# Patient Record
Sex: Female | Born: 1935
Health system: Southern US, Community
[De-identification: ages and names within clinical notes are randomized; demographics above are authoritative.]

## PROBLEM LIST (undated history)

## (undated) DIAGNOSIS — F329 Major depressive disorder, single episode, unspecified: Secondary | ICD-10-CM

## (undated) DIAGNOSIS — I739 Peripheral vascular disease, unspecified: Secondary | ICD-10-CM

## (undated) DIAGNOSIS — J9600 Acute respiratory failure, unspecified whether with hypoxia or hypercapnia: Secondary | ICD-10-CM

## (undated) DIAGNOSIS — K635 Polyp of colon: Secondary | ICD-10-CM

## (undated) DIAGNOSIS — L03115 Cellulitis of right lower limb: Secondary | ICD-10-CM

## (undated) DIAGNOSIS — M1990 Unspecified osteoarthritis, unspecified site: Secondary | ICD-10-CM

## (undated) DIAGNOSIS — E669 Obesity, unspecified: Secondary | ICD-10-CM

## (undated) DIAGNOSIS — G4733 Obstructive sleep apnea (adult) (pediatric): Secondary | ICD-10-CM

## (undated) DIAGNOSIS — L851 Acquired keratosis [keratoderma] palmaris et plantaris: Secondary | ICD-10-CM

## (undated) DIAGNOSIS — E041 Nontoxic single thyroid nodule: Secondary | ICD-10-CM

## (undated) DIAGNOSIS — F339 Major depressive disorder, recurrent, unspecified: Secondary | ICD-10-CM

## (undated) DIAGNOSIS — E119 Type 2 diabetes mellitus without complications: Secondary | ICD-10-CM

## (undated) DIAGNOSIS — M109 Gout, unspecified: Secondary | ICD-10-CM

## (undated) DIAGNOSIS — I10 Essential (primary) hypertension: Secondary | ICD-10-CM

## (undated) DIAGNOSIS — R531 Weakness: Secondary | ICD-10-CM

## (undated) DIAGNOSIS — I509 Heart failure, unspecified: Secondary | ICD-10-CM

## (undated) DIAGNOSIS — E785 Hyperlipidemia, unspecified: Secondary | ICD-10-CM

## (undated) DIAGNOSIS — I5033 Acute on chronic diastolic (congestive) heart failure: Secondary | ICD-10-CM

## (undated) DIAGNOSIS — K219 Gastro-esophageal reflux disease without esophagitis: Secondary | ICD-10-CM

## (undated) DIAGNOSIS — G47419 Narcolepsy without cataplexy: Secondary | ICD-10-CM

## (undated) DIAGNOSIS — I2510 Atherosclerotic heart disease of native coronary artery without angina pectoris: Secondary | ICD-10-CM

## (undated) DIAGNOSIS — G2581 Restless legs syndrome: Secondary | ICD-10-CM

## (undated) DIAGNOSIS — Z794 Long term (current) use of insulin: Secondary | ICD-10-CM

## (undated) DIAGNOSIS — N6009 Solitary cyst of unspecified breast: Secondary | ICD-10-CM

## (undated) DIAGNOSIS — R0609 Other forms of dyspnea: Secondary | ICD-10-CM

## (undated) DIAGNOSIS — R55 Syncope and collapse: Secondary | ICD-10-CM

## (undated) DIAGNOSIS — N8112 Cystocele, lateral: Secondary | ICD-10-CM

## (undated) DIAGNOSIS — M25579 Pain in unspecified ankle and joints of unspecified foot: Secondary | ICD-10-CM

## (undated) DIAGNOSIS — M549 Dorsalgia, unspecified: Secondary | ICD-10-CM

## (undated) DIAGNOSIS — L259 Unspecified contact dermatitis, unspecified cause: Secondary | ICD-10-CM

## (undated) DIAGNOSIS — M7989 Other specified soft tissue disorders: Secondary | ICD-10-CM

## (undated) DIAGNOSIS — C801 Malignant (primary) neoplasm, unspecified: Secondary | ICD-10-CM

## (undated) DIAGNOSIS — Z86010 Personal history of colonic polyps: Secondary | ICD-10-CM

## (undated) DIAGNOSIS — R609 Edema, unspecified: Secondary | ICD-10-CM

## (undated) DIAGNOSIS — R109 Unspecified abdominal pain: Secondary | ICD-10-CM

## (undated) DIAGNOSIS — G459 Transient cerebral ischemic attack, unspecified: Secondary | ICD-10-CM

## (undated) DIAGNOSIS — G4700 Insomnia, unspecified: Secondary | ICD-10-CM

## (undated) DIAGNOSIS — H8309 Labyrinthitis, unspecified ear: Secondary | ICD-10-CM

## (undated) DIAGNOSIS — R2681 Unsteadiness on feet: Secondary | ICD-10-CM

## (undated) DIAGNOSIS — R42 Dizziness and giddiness: Secondary | ICD-10-CM

## (undated) DIAGNOSIS — K589 Irritable bowel syndrome without diarrhea: Secondary | ICD-10-CM

## (undated) DIAGNOSIS — I639 Cerebral infarction, unspecified: Secondary | ICD-10-CM

## (undated) DIAGNOSIS — M25559 Pain in unspecified hip: Secondary | ICD-10-CM

## (undated) DIAGNOSIS — B373 Candidiasis of vulva and vagina: Secondary | ICD-10-CM

## (undated) DIAGNOSIS — E0842 Diabetes mellitus due to underlying condition with diabetic polyneuropathy: Secondary | ICD-10-CM

## (undated) DIAGNOSIS — R197 Diarrhea, unspecified: Secondary | ICD-10-CM

## (undated) DIAGNOSIS — E1165 Type 2 diabetes mellitus with hyperglycemia: Secondary | ICD-10-CM

## (undated) DIAGNOSIS — J449 Chronic obstructive pulmonary disease, unspecified: Secondary | ICD-10-CM

## (undated) DIAGNOSIS — N200 Calculus of kidney: Secondary | ICD-10-CM

## (undated) DIAGNOSIS — K5730 Diverticulosis of large intestine without perforation or abscess without bleeding: Secondary | ICD-10-CM

## (undated) DIAGNOSIS — M129 Arthropathy, unspecified: Secondary | ICD-10-CM

## (undated) DIAGNOSIS — R079 Chest pain, unspecified: Secondary | ICD-10-CM

## (undated) DIAGNOSIS — R413 Other amnesia: Secondary | ICD-10-CM

## (undated) DIAGNOSIS — L03116 Cellulitis of left lower limb: Secondary | ICD-10-CM

## (undated) DIAGNOSIS — D649 Anemia, unspecified: Secondary | ICD-10-CM

## (undated) DIAGNOSIS — E876 Hypokalemia: Secondary | ICD-10-CM

## (undated) DIAGNOSIS — R51 Headache: Secondary | ICD-10-CM

## (undated) DIAGNOSIS — I5022 Chronic systolic (congestive) heart failure: Secondary | ICD-10-CM

## (undated) HISTORY — DX: Dorsalgia, unspecified: M54.9

## (undated) HISTORY — DX: Cystocele, lateral: N81.12

## (undated) HISTORY — DX: Polyp of colon: K63.5

## (undated) HISTORY — DX: Cellulitis of left lower limb: L03.116

## (undated) HISTORY — DX: Long term (current) use of insulin: Z79.4

## (undated) HISTORY — DX: Labyrinthitis, unspecified ear: H83.09

## (undated) HISTORY — DX: Cerebral infarction, unspecified: I63.9

## (undated) HISTORY — DX: Pain in unspecified ankle and joints of unspecified foot: M25.579

## (undated) HISTORY — DX: Essential (primary) hypertension: I10

## (undated) HISTORY — DX: Narcolepsy without cataplexy: G47.419

## (undated) HISTORY — DX: Type 2 diabetes mellitus without complications: E11.9

## (undated) HISTORY — DX: Atherosclerotic heart disease of native coronary artery without angina pectoris: I25.10

## (undated) HISTORY — DX: Unspecified osteoarthritis, unspecified site: M19.90

## (undated) HISTORY — DX: Restless legs syndrome: G25.81

## (undated) HISTORY — DX: Hypokalemia: E87.6

## (undated) HISTORY — DX: Chronic systolic (congestive) heart failure: I50.22

## (undated) HISTORY — DX: Obesity, unspecified: E66.9

## (undated) HISTORY — DX: Irritable bowel syndrome without diarrhea: K58.9

## (undated) HISTORY — DX: Unsteadiness on feet: R26.81

## (undated) HISTORY — DX: Unspecified contact dermatitis, unspecified cause: L25.9

## (undated) HISTORY — DX: Anemia, unspecified: D64.9

## (undated) HISTORY — PX: ABDOMINAL HYSTERECTOMY: SHX81

## (undated) HISTORY — DX: Solitary cyst of unspecified breast: N60.09

## (undated) HISTORY — DX: Gout, unspecified: M10.9

## (undated) HISTORY — DX: Edema, unspecified: R60.9

## (undated) HISTORY — DX: Chest pain, unspecified: R07.9

## (undated) HISTORY — DX: Other amnesia: R41.3

## (undated) HISTORY — DX: Acute on chronic diastolic (congestive) heart failure: I50.33

## (undated) HISTORY — DX: Diabetes mellitus due to underlying condition with diabetic polyneuropathy: E08.42

## (undated) HISTORY — PX: BACK SURGERY: SHX140

## (undated) HISTORY — DX: Gastro-esophageal reflux disease without esophagitis: K21.9

## (undated) HISTORY — DX: Headache: R51

## (undated) HISTORY — DX: Candidiasis of vulva and vagina: B37.3

## (undated) HISTORY — DX: Calculus of kidney: N20.0

## (undated) HISTORY — DX: Acquired keratosis (keratoderma) palmaris et plantaris: L85.1

## (undated) HISTORY — DX: Weakness: R53.1

## (undated) HISTORY — PX: SPINE SURGERY: SHX786

## (undated) HISTORY — DX: Arthropathy, unspecified: M12.9

## (undated) HISTORY — DX: Type 2 diabetes mellitus with hyperglycemia: E11.65

## (undated) HISTORY — DX: Nontoxic single thyroid nodule: E04.1

## (undated) HISTORY — DX: Major depressive disorder, single episode, unspecified: F32.9

## (undated) HISTORY — DX: Unspecified abdominal pain: R10.9

## (undated) HISTORY — PX: INTRAOCULAR LENS INSERTION: SHX110

## (undated) HISTORY — DX: Transient cerebral ischemic attack, unspecified: G45.9

## (undated) HISTORY — DX: Acute respiratory failure, unspecified whether with hypoxia or hypercapnia: J96.00

## (undated) HISTORY — PX: CATARACT EXTRACTION: SUR2

## (undated) HISTORY — DX: Hyperlipidemia, unspecified: E78.5

## (undated) HISTORY — DX: Chronic obstructive pulmonary disease, unspecified: J44.9

## (undated) HISTORY — DX: Obstructive sleep apnea (adult) (pediatric): G47.33

## (undated) HISTORY — DX: Syncope and collapse: R55

## (undated) HISTORY — DX: Major depressive disorder, recurrent, unspecified: F33.9

## (undated) HISTORY — DX: Diarrhea, unspecified: R19.7

## (undated) HISTORY — PX: TONSILECTOMY, ADENOIDECTOMY, BILATERAL MYRINGOTOMY AND TUBES: SHX2538

## (undated) HISTORY — DX: Peripheral vascular disease, unspecified: I73.9

## (undated) HISTORY — DX: Diverticulosis of large intestine without perforation or abscess without bleeding: K57.30

## (undated) HISTORY — DX: Pain in unspecified hip: M25.559

## (undated) HISTORY — DX: Other forms of dyspnea: R06.09

## (undated) HISTORY — DX: Other specified soft tissue disorders: M79.89

## (undated) HISTORY — DX: Insomnia, unspecified: G47.00

## (undated) HISTORY — DX: Personal history of colonic polyps: Z86.010

## (undated) HISTORY — DX: Cellulitis of right lower limb: L03.115

## (undated) HISTORY — DX: Dizziness and giddiness: R42

---

## 1999-04-12 ENCOUNTER — Ambulatory Visit (HOSPITAL_COMMUNITY): Admission: RE | Admit: 1999-04-12 | Discharge: 1999-04-12 | Payer: Self-pay | Admitting: Cardiology

## 1999-05-23 ENCOUNTER — Ambulatory Visit: Admission: RE | Admit: 1999-05-23 | Discharge: 1999-05-23 | Payer: Self-pay | Admitting: Pulmonary Disease

## 1999-07-26 ENCOUNTER — Encounter: Payer: Self-pay | Admitting: Pulmonary Disease

## 1999-07-26 ENCOUNTER — Ambulatory Visit (HOSPITAL_COMMUNITY): Admission: RE | Admit: 1999-07-26 | Discharge: 1999-07-26 | Payer: Self-pay | Admitting: Pulmonary Disease

## 1999-08-09 ENCOUNTER — Other Ambulatory Visit: Admission: RE | Admit: 1999-08-09 | Discharge: 1999-08-09 | Payer: Self-pay | Admitting: *Deleted

## 2000-10-25 ENCOUNTER — Other Ambulatory Visit: Admission: RE | Admit: 2000-10-25 | Discharge: 2000-10-25 | Payer: Self-pay | Admitting: *Deleted

## 2001-06-21 ENCOUNTER — Inpatient Hospital Stay (HOSPITAL_COMMUNITY): Admission: EM | Admit: 2001-06-21 | Discharge: 2001-06-22 | Payer: Self-pay

## 2002-11-26 ENCOUNTER — Encounter (INDEPENDENT_AMBULATORY_CARE_PROVIDER_SITE_OTHER): Payer: Self-pay | Admitting: Specialist

## 2002-11-26 ENCOUNTER — Ambulatory Visit (HOSPITAL_COMMUNITY): Admission: RE | Admit: 2002-11-26 | Discharge: 2002-11-26 | Payer: Self-pay | Admitting: Gastroenterology

## 2002-12-19 ENCOUNTER — Encounter: Payer: Self-pay | Admitting: General Surgery

## 2002-12-23 ENCOUNTER — Ambulatory Visit (HOSPITAL_COMMUNITY): Admission: RE | Admit: 2002-12-23 | Discharge: 2002-12-23 | Payer: Self-pay | Admitting: General Surgery

## 2002-12-23 ENCOUNTER — Encounter (INDEPENDENT_AMBULATORY_CARE_PROVIDER_SITE_OTHER): Payer: Self-pay | Admitting: Specialist

## 2003-04-09 ENCOUNTER — Inpatient Hospital Stay (HOSPITAL_COMMUNITY): Admission: EM | Admit: 2003-04-09 | Discharge: 2003-04-10 | Payer: Self-pay

## 2003-04-09 ENCOUNTER — Encounter: Payer: Self-pay | Admitting: Neurology

## 2003-04-10 ENCOUNTER — Encounter: Payer: Self-pay | Admitting: Cardiovascular Disease

## 2003-04-10 ENCOUNTER — Encounter: Payer: Self-pay | Admitting: Neurology

## 2003-04-10 ENCOUNTER — Encounter: Payer: Self-pay | Admitting: Emergency Medicine

## 2004-03-30 ENCOUNTER — Encounter: Admission: RE | Admit: 2004-03-30 | Discharge: 2004-03-30 | Payer: Self-pay | Admitting: Family Medicine

## 2004-04-13 ENCOUNTER — Encounter: Admission: RE | Admit: 2004-04-13 | Discharge: 2004-04-13 | Payer: Self-pay | Admitting: Family Medicine

## 2004-07-21 ENCOUNTER — Encounter: Admission: RE | Admit: 2004-07-21 | Discharge: 2004-07-21 | Payer: Self-pay | Admitting: Specialist

## 2004-08-10 ENCOUNTER — Ambulatory Visit: Payer: Self-pay | Admitting: Family Medicine

## 2004-11-11 ENCOUNTER — Ambulatory Visit: Payer: Self-pay | Admitting: Family Medicine

## 2004-11-24 ENCOUNTER — Ambulatory Visit: Payer: Self-pay | Admitting: Family Medicine

## 2005-01-19 ENCOUNTER — Ambulatory Visit: Payer: Self-pay | Admitting: Family Medicine

## 2005-02-22 ENCOUNTER — Ambulatory Visit: Payer: Self-pay | Admitting: Family Medicine

## 2005-03-01 ENCOUNTER — Ambulatory Visit: Payer: Self-pay

## 2005-04-06 ENCOUNTER — Ambulatory Visit: Payer: Self-pay | Admitting: Family Medicine

## 2005-04-14 ENCOUNTER — Ambulatory Visit: Payer: Self-pay | Admitting: Internal Medicine

## 2005-04-14 ENCOUNTER — Encounter: Admission: RE | Admit: 2005-04-14 | Discharge: 2005-04-14 | Payer: Self-pay | Admitting: Internal Medicine

## 2005-05-03 ENCOUNTER — Ambulatory Visit: Payer: Self-pay | Admitting: Internal Medicine

## 2005-05-18 ENCOUNTER — Ambulatory Visit: Payer: Self-pay | Admitting: Family Medicine

## 2005-05-23 ENCOUNTER — Ambulatory Visit: Admission: RE | Admit: 2005-05-23 | Discharge: 2005-05-23 | Payer: Self-pay | Admitting: Specialist

## 2005-05-23 ENCOUNTER — Ambulatory Visit: Payer: Self-pay | Admitting: Family Medicine

## 2005-06-22 ENCOUNTER — Ambulatory Visit: Payer: Self-pay | Admitting: Family Medicine

## 2005-08-17 ENCOUNTER — Ambulatory Visit: Payer: Self-pay | Admitting: Family Medicine

## 2005-08-29 ENCOUNTER — Ambulatory Visit: Payer: Self-pay | Admitting: Family Medicine

## 2005-09-14 ENCOUNTER — Inpatient Hospital Stay (HOSPITAL_COMMUNITY): Admission: RE | Admit: 2005-09-14 | Discharge: 2005-09-17 | Payer: Self-pay | Admitting: Specialist

## 2005-09-21 ENCOUNTER — Ambulatory Visit: Payer: Self-pay | Admitting: Family Medicine

## 2005-10-17 ENCOUNTER — Ambulatory Visit: Payer: Self-pay | Admitting: Family Medicine

## 2006-01-04 ENCOUNTER — Ambulatory Visit: Payer: Self-pay | Admitting: Family Medicine

## 2006-01-10 ENCOUNTER — Ambulatory Visit: Payer: Self-pay | Admitting: Internal Medicine

## 2006-02-27 ENCOUNTER — Ambulatory Visit: Payer: Self-pay | Admitting: Family Medicine

## 2006-03-15 ENCOUNTER — Ambulatory Visit: Payer: Self-pay | Admitting: Family Medicine

## 2006-04-09 ENCOUNTER — Ambulatory Visit: Payer: Self-pay | Admitting: Internal Medicine

## 2006-04-12 ENCOUNTER — Ambulatory Visit: Payer: Self-pay | Admitting: Family Medicine

## 2006-04-25 ENCOUNTER — Ambulatory Visit: Payer: Self-pay | Admitting: Family Medicine

## 2006-05-24 ENCOUNTER — Ambulatory Visit: Payer: Self-pay | Admitting: Family Medicine

## 2006-08-22 ENCOUNTER — Ambulatory Visit: Payer: Self-pay | Admitting: Family Medicine

## 2006-08-28 ENCOUNTER — Ambulatory Visit: Payer: Self-pay | Admitting: Family Medicine

## 2006-12-25 ENCOUNTER — Ambulatory Visit: Payer: Self-pay | Admitting: Family Medicine

## 2006-12-25 LAB — CONVERTED CEMR LAB
Glucose, Bld: 212 mg/dL — ABNORMAL HIGH (ref 70–99)
Hgb A1c MFr Bld: 14.3 % — ABNORMAL HIGH (ref 4.6–6.0)

## 2007-01-08 ENCOUNTER — Ambulatory Visit: Payer: Self-pay | Admitting: Family Medicine

## 2007-01-08 LAB — CONVERTED CEMR LAB
ALT: 23 units/L (ref 0–40)
Alkaline Phosphatase: 84 units/L (ref 39–117)
BUN: 14 mg/dL (ref 6–23)
Bilirubin, Direct: 0.1 mg/dL (ref 0.0–0.3)
Calcium: 9.2 mg/dL (ref 8.4–10.5)
Cholesterol: 144 mg/dL (ref 0–200)
Eosinophils Absolute: 0.1 10*3/uL (ref 0.0–0.6)
GFR calc Af Amer: 106 mL/min
GFR calc non Af Amer: 88 mL/min
HDL: 43.3 mg/dL (ref >39.0)
Hemoglobin: 13.6 g/dL (ref 12.0–15.0)
Lymphocytes Relative: 36.6 % (ref 12.0–46.0)
MCHC: 33.3 g/dL (ref 30.0–36.0)
MCV: 88.9 fL (ref 78.0–100.0)
Monocytes Absolute: 0.5 10*3/uL (ref 0.2–0.7)
Monocytes Relative: 6.3 % (ref 3.0–11.0)
Neutro Abs: 4.5 10*3/uL (ref 1.4–7.7)
Platelets: 236 10*3/uL (ref 150–400)
Potassium: 4 meq/L (ref 3.5–5.1)
TSH: 0.77 microintl units/mL (ref 0.35–5.50)
Total Protein: 7.1 g/dL (ref 6.0–8.3)
Triglycerides: 149 mg/dL (ref 0–149)
VLDL: 30 mg/dL (ref 0–40)

## 2007-01-16 ENCOUNTER — Ambulatory Visit: Payer: Self-pay

## 2007-02-19 ENCOUNTER — Ambulatory Visit: Payer: Self-pay | Admitting: Family Medicine

## 2007-02-19 LAB — CONVERTED CEMR LAB: Hgb A1c MFr Bld: 11.3 % — ABNORMAL HIGH (ref 4.6–6.0)

## 2007-03-01 ENCOUNTER — Encounter: Payer: Self-pay | Admitting: Family Medicine

## 2007-03-01 DIAGNOSIS — F329 Major depressive disorder, single episode, unspecified: Secondary | ICD-10-CM

## 2007-03-01 DIAGNOSIS — E669 Obesity, unspecified: Secondary | ICD-10-CM

## 2007-03-01 DIAGNOSIS — I2510 Atherosclerotic heart disease of native coronary artery without angina pectoris: Secondary | ICD-10-CM

## 2007-03-01 DIAGNOSIS — I10 Essential (primary) hypertension: Secondary | ICD-10-CM

## 2007-03-01 DIAGNOSIS — F3289 Other specified depressive episodes: Secondary | ICD-10-CM

## 2007-03-01 DIAGNOSIS — G2581 Restless legs syndrome: Secondary | ICD-10-CM

## 2007-03-01 DIAGNOSIS — F339 Major depressive disorder, recurrent, unspecified: Secondary | ICD-10-CM

## 2007-03-01 HISTORY — DX: Atherosclerotic heart disease of native coronary artery without angina pectoris: I25.10

## 2007-03-01 HISTORY — DX: Obesity, unspecified: E66.9

## 2007-03-01 HISTORY — DX: Restless legs syndrome: G25.81

## 2007-03-01 HISTORY — DX: Major depressive disorder, single episode, unspecified: F32.9

## 2007-03-01 HISTORY — DX: Essential (primary) hypertension: I10

## 2007-03-01 HISTORY — DX: Other specified depressive episodes: F32.89

## 2007-03-01 HISTORY — DX: Major depressive disorder, recurrent, unspecified: F33.9

## 2007-04-10 ENCOUNTER — Encounter: Payer: Self-pay | Admitting: Family Medicine

## 2007-04-30 ENCOUNTER — Encounter: Payer: Self-pay | Admitting: Family Medicine

## 2007-05-27 ENCOUNTER — Ambulatory Visit: Payer: Self-pay | Admitting: Family Medicine

## 2007-06-05 ENCOUNTER — Telehealth: Payer: Self-pay | Admitting: *Deleted

## 2007-06-12 LAB — CONVERTED CEMR LAB: Hgb A1c MFr Bld: 10 % — ABNORMAL HIGH (ref 4.6–6.0)

## 2007-07-09 ENCOUNTER — Telehealth: Payer: Self-pay | Admitting: Family Medicine

## 2007-07-17 ENCOUNTER — Telehealth (INDEPENDENT_AMBULATORY_CARE_PROVIDER_SITE_OTHER): Payer: Self-pay | Admitting: *Deleted

## 2007-07-18 ENCOUNTER — Telehealth: Payer: Self-pay | Admitting: Family Medicine

## 2007-07-31 ENCOUNTER — Telehealth: Payer: Self-pay | Admitting: Family Medicine

## 2007-08-07 ENCOUNTER — Ambulatory Visit: Payer: Self-pay | Admitting: Family Medicine

## 2007-08-07 DIAGNOSIS — M1990 Unspecified osteoarthritis, unspecified site: Secondary | ICD-10-CM

## 2007-08-07 HISTORY — DX: Unspecified osteoarthritis, unspecified site: M19.90

## 2007-08-15 ENCOUNTER — Telehealth: Payer: Self-pay | Admitting: Family Medicine

## 2007-09-30 ENCOUNTER — Ambulatory Visit: Payer: Self-pay | Admitting: Family Medicine

## 2007-10-15 ENCOUNTER — Telehealth: Payer: Self-pay | Admitting: Family Medicine

## 2007-11-06 ENCOUNTER — Ambulatory Visit: Payer: Self-pay | Admitting: Gastroenterology

## 2007-11-06 DIAGNOSIS — K219 Gastro-esophageal reflux disease without esophagitis: Secondary | ICD-10-CM

## 2007-11-06 DIAGNOSIS — R197 Diarrhea, unspecified: Secondary | ICD-10-CM

## 2007-11-06 HISTORY — DX: Gastro-esophageal reflux disease without esophagitis: K21.9

## 2007-11-13 ENCOUNTER — Encounter: Payer: Self-pay | Admitting: Family Medicine

## 2007-11-13 ENCOUNTER — Ambulatory Visit: Payer: Self-pay | Admitting: Gastroenterology

## 2007-11-13 ENCOUNTER — Encounter: Payer: Self-pay | Admitting: Gastroenterology

## 2007-11-13 HISTORY — PX: ESOPHAGOGASTRODUODENOSCOPY: SHX1529

## 2007-11-13 HISTORY — PX: COLONOSCOPY: SHX174

## 2007-12-11 ENCOUNTER — Ambulatory Visit: Payer: Self-pay | Admitting: Family Medicine

## 2007-12-11 DIAGNOSIS — M129 Arthropathy, unspecified: Secondary | ICD-10-CM

## 2007-12-11 DIAGNOSIS — G4700 Insomnia, unspecified: Secondary | ICD-10-CM

## 2007-12-11 HISTORY — DX: Arthropathy, unspecified: M12.9

## 2007-12-11 HISTORY — DX: Insomnia, unspecified: G47.00

## 2007-12-17 ENCOUNTER — Ambulatory Visit: Payer: Self-pay | Admitting: Family Medicine

## 2007-12-17 DIAGNOSIS — N6009 Solitary cyst of unspecified breast: Secondary | ICD-10-CM | POA: Insufficient documentation

## 2007-12-17 HISTORY — DX: Solitary cyst of unspecified breast: N60.09

## 2007-12-23 ENCOUNTER — Ambulatory Visit: Payer: Self-pay | Admitting: Gastroenterology

## 2008-01-16 ENCOUNTER — Ambulatory Visit: Payer: Self-pay | Admitting: Family Medicine

## 2008-01-29 ENCOUNTER — Encounter: Payer: Self-pay | Admitting: Family Medicine

## 2008-01-30 ENCOUNTER — Ambulatory Visit: Payer: Self-pay | Admitting: Family Medicine

## 2008-01-30 DIAGNOSIS — D649 Anemia, unspecified: Secondary | ICD-10-CM

## 2008-01-30 HISTORY — DX: Anemia, unspecified: D64.9

## 2008-02-04 LAB — CONVERTED CEMR LAB
Albumin: 3.4 g/dL — ABNORMAL LOW (ref 3.5–5.2)
Basophils Absolute: 0 10*3/uL (ref 0.0–0.1)
CO2: 32 meq/L (ref 19–32)
Chloride: 101 meq/L (ref 96–112)
Glucose, Bld: 207 mg/dL — ABNORMAL HIGH (ref 70–99)
Hemoglobin: 13.9 g/dL (ref 12.0–15.0)
Hgb A1c MFr Bld: 9.6 % — ABNORMAL HIGH (ref 4.6–6.0)
Lymphocytes Relative: 20.9 % (ref 12.0–46.0)
MCHC: 33.2 g/dL (ref 30.0–36.0)
Monocytes Relative: 4 % (ref 3.0–12.0)
Neutro Abs: 11.5 10*3/uL — ABNORMAL HIGH (ref 1.4–7.7)
Neutrophils Relative %: 74.6 % (ref 43.0–77.0)
Phosphorus: 2.6 mg/dL (ref 2.3–4.6)
RBC: 4.66 M/uL (ref 3.87–5.11)
RDW: 13.7 % (ref 11.5–14.6)
Sodium: 138 meq/L (ref 135–145)

## 2008-03-12 ENCOUNTER — Ambulatory Visit: Payer: Self-pay | Admitting: Family Medicine

## 2008-04-07 ENCOUNTER — Encounter: Payer: Self-pay | Admitting: Family Medicine

## 2008-04-21 ENCOUNTER — Encounter: Payer: Self-pay | Admitting: Family Medicine

## 2008-04-30 ENCOUNTER — Encounter: Payer: Self-pay | Admitting: Family Medicine

## 2008-06-09 ENCOUNTER — Encounter: Payer: Self-pay | Admitting: Family Medicine

## 2008-06-20 ENCOUNTER — Ambulatory Visit: Payer: Self-pay | Admitting: Internal Medicine

## 2008-06-20 ENCOUNTER — Inpatient Hospital Stay (HOSPITAL_COMMUNITY): Admission: EM | Admit: 2008-06-20 | Discharge: 2008-06-21 | Payer: Self-pay | Admitting: Emergency Medicine

## 2008-06-23 ENCOUNTER — Ambulatory Visit: Payer: Self-pay | Admitting: Family Medicine

## 2008-06-23 DIAGNOSIS — M549 Dorsalgia, unspecified: Secondary | ICD-10-CM

## 2008-06-23 HISTORY — DX: Dorsalgia, unspecified: M54.9

## 2008-07-08 ENCOUNTER — Telehealth: Payer: Self-pay | Admitting: Family Medicine

## 2008-08-26 ENCOUNTER — Ambulatory Visit: Payer: Self-pay | Admitting: Family Medicine

## 2008-08-26 DIAGNOSIS — G47419 Narcolepsy without cataplexy: Secondary | ICD-10-CM

## 2008-08-26 DIAGNOSIS — R55 Syncope and collapse: Secondary | ICD-10-CM

## 2008-08-26 DIAGNOSIS — E785 Hyperlipidemia, unspecified: Secondary | ICD-10-CM

## 2008-08-26 DIAGNOSIS — E119 Type 2 diabetes mellitus without complications: Secondary | ICD-10-CM

## 2008-08-26 HISTORY — DX: Type 2 diabetes mellitus without complications: E11.9

## 2008-08-26 HISTORY — DX: Hyperlipidemia, unspecified: E78.5

## 2008-08-26 HISTORY — DX: Syncope and collapse: R55

## 2008-08-26 HISTORY — DX: Narcolepsy without cataplexy: G47.419

## 2008-08-31 ENCOUNTER — Ambulatory Visit: Payer: Self-pay | Admitting: Cardiovascular Disease

## 2008-09-01 ENCOUNTER — Telehealth: Payer: Self-pay | Admitting: Family Medicine

## 2008-09-02 ENCOUNTER — Ambulatory Visit: Payer: Self-pay | Admitting: Family Medicine

## 2008-09-02 DIAGNOSIS — R609 Edema, unspecified: Secondary | ICD-10-CM

## 2008-09-02 HISTORY — DX: Edema, unspecified: R60.9

## 2008-09-02 LAB — CONVERTED CEMR LAB
Blood in Urine, dipstick: NEGATIVE
Ketones, urine, test strip: NEGATIVE
Specific Gravity, Urine: >=1.03
Urobilinogen, UA: 0.2

## 2008-09-03 ENCOUNTER — Telehealth: Payer: Self-pay | Admitting: Family Medicine

## 2008-09-10 ENCOUNTER — Telehealth: Payer: Self-pay | Admitting: Family Medicine

## 2008-09-17 ENCOUNTER — Ambulatory Visit: Payer: Self-pay | Admitting: Family Medicine

## 2008-10-21 ENCOUNTER — Ambulatory Visit: Payer: Self-pay | Admitting: Family Medicine

## 2008-10-23 ENCOUNTER — Encounter: Payer: Self-pay | Admitting: Family Medicine

## 2008-11-05 ENCOUNTER — Telehealth: Payer: Self-pay | Admitting: Family Medicine

## 2008-11-09 ENCOUNTER — Encounter: Payer: Self-pay | Admitting: Family Medicine

## 2008-11-23 ENCOUNTER — Encounter: Payer: Self-pay | Admitting: Family Medicine

## 2008-11-25 ENCOUNTER — Ambulatory Visit: Payer: Self-pay | Admitting: Family Medicine

## 2008-11-25 DIAGNOSIS — N3000 Acute cystitis without hematuria: Secondary | ICD-10-CM | POA: Insufficient documentation

## 2008-11-25 LAB — CONVERTED CEMR LAB
Bilirubin Urine: NEGATIVE
Glucose, Urine, Semiquant: NEGATIVE
Ketones, urine, test strip: NEGATIVE
pH: 5

## 2008-11-26 ENCOUNTER — Encounter: Payer: Self-pay | Admitting: Family Medicine

## 2008-12-01 ENCOUNTER — Encounter: Payer: Self-pay | Admitting: Family Medicine

## 2008-12-15 ENCOUNTER — Telehealth: Payer: Self-pay | Admitting: Family Medicine

## 2008-12-15 ENCOUNTER — Ambulatory Visit: Payer: Self-pay | Admitting: Family Medicine

## 2008-12-15 LAB — CONVERTED CEMR LAB
Glucose, Urine, Semiquant: NEGATIVE
Specific Gravity, Urine: >=1.03
WBC Urine, dipstick: NEGATIVE
pH: 5

## 2008-12-21 ENCOUNTER — Encounter: Payer: Self-pay | Admitting: Family Medicine

## 2009-01-21 ENCOUNTER — Encounter: Payer: Self-pay | Admitting: Family Medicine

## 2009-01-26 ENCOUNTER — Ambulatory Visit: Payer: Self-pay | Admitting: Family Medicine

## 2009-01-26 DIAGNOSIS — I739 Peripheral vascular disease, unspecified: Secondary | ICD-10-CM

## 2009-01-26 DIAGNOSIS — K589 Irritable bowel syndrome without diarrhea: Secondary | ICD-10-CM

## 2009-01-26 HISTORY — DX: Irritable bowel syndrome, unspecified: K58.9

## 2009-01-26 HISTORY — DX: Peripheral vascular disease, unspecified: I73.9

## 2009-01-29 ENCOUNTER — Encounter: Payer: Self-pay | Admitting: Family Medicine

## 2009-01-29 ENCOUNTER — Ambulatory Visit: Payer: Self-pay

## 2009-02-10 LAB — CONVERTED CEMR LAB
Basophils Absolute: 0 10*3/uL (ref 0.0–0.1)
CO2: 33 meq/L — ABNORMAL HIGH (ref 19–32)
Calcium: 9.1 mg/dL (ref 8.4–10.5)
Chloride: 107 meq/L (ref 96–112)
Lymphocytes Relative: 33 % (ref 12.0–46.0)
Monocytes Relative: 5.5 % (ref 3.0–12.0)
Neutrophils Relative %: 59.6 % (ref 43.0–77.0)
Platelets: 276 10*3/uL (ref 150.0–400.0)
Potassium: 4.1 meq/L (ref 3.5–5.1)
RDW: 12.8 % (ref 11.5–14.6)
Sed Rate: 39 mm/hr — ABNORMAL HIGH (ref 0–22)
Sodium: 144 meq/L (ref 135–145)

## 2009-02-24 ENCOUNTER — Encounter: Payer: Self-pay | Admitting: Family Medicine

## 2009-03-04 ENCOUNTER — Encounter: Payer: Self-pay | Admitting: Family Medicine

## 2009-03-04 ENCOUNTER — Telehealth: Payer: Self-pay | Admitting: Family Medicine

## 2009-03-10 ENCOUNTER — Ambulatory Visit: Payer: Self-pay | Admitting: Family Medicine

## 2009-03-10 DIAGNOSIS — L259 Unspecified contact dermatitis, unspecified cause: Secondary | ICD-10-CM

## 2009-03-10 HISTORY — DX: Unspecified contact dermatitis, unspecified cause: L25.9

## 2009-03-17 ENCOUNTER — Telehealth: Payer: Self-pay | Admitting: Family Medicine

## 2009-03-22 ENCOUNTER — Telehealth (INDEPENDENT_AMBULATORY_CARE_PROVIDER_SITE_OTHER): Payer: Self-pay | Admitting: *Deleted

## 2009-03-23 ENCOUNTER — Encounter: Payer: Self-pay | Admitting: Internal Medicine

## 2009-03-23 ENCOUNTER — Encounter: Payer: Self-pay | Admitting: Family Medicine

## 2009-03-23 ENCOUNTER — Ambulatory Visit: Payer: Self-pay

## 2009-03-31 ENCOUNTER — Telehealth (INDEPENDENT_AMBULATORY_CARE_PROVIDER_SITE_OTHER): Payer: Self-pay

## 2009-04-01 ENCOUNTER — Ambulatory Visit: Payer: Self-pay

## 2009-04-29 ENCOUNTER — Ambulatory Visit: Payer: Self-pay | Admitting: Family Medicine

## 2009-04-29 DIAGNOSIS — N200 Calculus of kidney: Secondary | ICD-10-CM | POA: Insufficient documentation

## 2009-04-29 HISTORY — DX: Calculus of kidney: N20.0

## 2009-04-29 LAB — CONVERTED CEMR LAB
Blood in Urine, dipstick: NEGATIVE
Glucose, Urine, Semiquant: NEGATIVE
Ketones, urine, test strip: NEGATIVE
Specific Gravity, Urine: 1.025
WBC Urine, dipstick: NEGATIVE
pH: 5.5

## 2009-04-30 LAB — CONVERTED CEMR LAB
Albumin: 3.7 g/dL (ref 3.5–5.2)
BUN: 17 mg/dL (ref 6–23)
Glucose, Bld: 70 mg/dL (ref 70–99)
Phosphorus: 4 mg/dL (ref 2.3–4.6)
Potassium: 4 meq/L (ref 3.5–5.1)

## 2009-05-04 ENCOUNTER — Ambulatory Visit: Payer: Self-pay | Admitting: Cardiology

## 2009-05-13 ENCOUNTER — Encounter: Payer: Self-pay | Admitting: Family Medicine

## 2009-05-21 ENCOUNTER — Telehealth: Payer: Self-pay | Admitting: Family Medicine

## 2009-06-02 ENCOUNTER — Ambulatory Visit: Payer: Self-pay | Admitting: Family Medicine

## 2009-06-02 DIAGNOSIS — L851 Acquired keratosis [keratoderma] palmaris et plantaris: Secondary | ICD-10-CM

## 2009-06-02 HISTORY — DX: Acquired keratosis (keratoderma) palmaris et plantaris: L85.1

## 2009-07-02 HISTORY — PX: CARDIAC CATHETERIZATION: SHX172

## 2009-07-06 ENCOUNTER — Ambulatory Visit: Payer: Self-pay

## 2009-07-06 ENCOUNTER — Ambulatory Visit: Payer: Self-pay | Admitting: Family Medicine

## 2009-07-06 ENCOUNTER — Telehealth: Payer: Self-pay | Admitting: Internal Medicine

## 2009-07-07 ENCOUNTER — Telehealth: Payer: Self-pay | Admitting: Family Medicine

## 2009-07-07 ENCOUNTER — Ambulatory Visit: Payer: Self-pay | Admitting: Family Medicine

## 2009-07-22 ENCOUNTER — Observation Stay (HOSPITAL_COMMUNITY): Admission: EM | Admit: 2009-07-22 | Discharge: 2009-07-23 | Payer: Self-pay | Admitting: Emergency Medicine

## 2009-07-22 ENCOUNTER — Ambulatory Visit: Payer: Self-pay | Admitting: Internal Medicine

## 2009-08-03 ENCOUNTER — Ambulatory Visit: Payer: Self-pay | Admitting: Family Medicine

## 2009-08-11 ENCOUNTER — Telehealth: Payer: Self-pay | Admitting: Family Medicine

## 2009-10-07 ENCOUNTER — Ambulatory Visit: Payer: Self-pay | Admitting: Family Medicine

## 2009-10-07 LAB — CONVERTED CEMR LAB
Ketones, urine, test strip: NEGATIVE
Nitrite: NEGATIVE
Specific Gravity, Urine: 1.025

## 2009-10-08 ENCOUNTER — Encounter: Payer: Self-pay | Admitting: Family Medicine

## 2009-10-20 ENCOUNTER — Ambulatory Visit: Payer: Self-pay | Admitting: Family Medicine

## 2009-10-20 DIAGNOSIS — N390 Urinary tract infection, site not specified: Secondary | ICD-10-CM | POA: Insufficient documentation

## 2009-10-20 DIAGNOSIS — G459 Transient cerebral ischemic attack, unspecified: Secondary | ICD-10-CM

## 2009-10-20 HISTORY — DX: Transient cerebral ischemic attack, unspecified: G45.9

## 2009-11-03 ENCOUNTER — Ambulatory Visit: Payer: Self-pay | Admitting: Family Medicine

## 2009-11-03 DIAGNOSIS — H8309 Labyrinthitis, unspecified ear: Secondary | ICD-10-CM

## 2009-11-03 HISTORY — DX: Labyrinthitis, unspecified ear: H83.09

## 2009-11-04 ENCOUNTER — Telehealth: Payer: Self-pay | Admitting: Family Medicine

## 2009-11-30 ENCOUNTER — Ambulatory Visit: Payer: Self-pay | Admitting: Family Medicine

## 2009-12-02 LAB — CONVERTED CEMR LAB
BUN: 13 mg/dL (ref 6–23)
Calcium: 8.9 mg/dL (ref 8.4–10.5)
Creatinine, Ser: 0.7 mg/dL (ref 0.4–1.2)
GFR calc non Af Amer: 86.86 mL/min (ref >60)

## 2009-12-03 ENCOUNTER — Ambulatory Visit: Payer: Self-pay

## 2009-12-03 ENCOUNTER — Encounter: Payer: Self-pay | Admitting: Family Medicine

## 2009-12-09 ENCOUNTER — Telehealth: Payer: Self-pay | Admitting: Family Medicine

## 2010-01-06 ENCOUNTER — Telehealth: Payer: Self-pay | Admitting: Family Medicine

## 2010-01-17 ENCOUNTER — Encounter: Payer: Self-pay | Admitting: Family Medicine

## 2010-01-17 ENCOUNTER — Ambulatory Visit: Payer: Self-pay | Admitting: Vascular Surgery

## 2010-01-28 ENCOUNTER — Ambulatory Visit: Payer: Self-pay | Admitting: Family Medicine

## 2010-01-28 DIAGNOSIS — J209 Acute bronchitis, unspecified: Secondary | ICD-10-CM

## 2010-02-10 ENCOUNTER — Ambulatory Visit: Payer: Self-pay | Admitting: Family Medicine

## 2010-02-10 DIAGNOSIS — R05 Cough: Secondary | ICD-10-CM

## 2010-02-15 ENCOUNTER — Telehealth: Payer: Self-pay | Admitting: Family Medicine

## 2010-03-03 ENCOUNTER — Encounter: Payer: Self-pay | Admitting: Family Medicine

## 2010-03-08 ENCOUNTER — Telehealth: Payer: Self-pay | Admitting: Family Medicine

## 2010-03-29 ENCOUNTER — Telehealth: Payer: Self-pay | Admitting: Family Medicine

## 2010-03-30 ENCOUNTER — Ambulatory Visit: Payer: Self-pay | Admitting: Family Medicine

## 2010-03-30 LAB — CONVERTED CEMR LAB
Specific Gravity, Urine: <1.005
pH: 5

## 2010-04-13 ENCOUNTER — Ambulatory Visit: Payer: Self-pay | Admitting: Family Medicine

## 2010-04-13 LAB — CONVERTED CEMR LAB
Nitrite: POSITIVE
Specific Gravity, Urine: 1.025
Urobilinogen, UA: 1
WBC Urine, dipstick: NEGATIVE

## 2010-05-24 ENCOUNTER — Telehealth: Payer: Self-pay | Admitting: Family Medicine

## 2010-05-25 ENCOUNTER — Encounter: Payer: Self-pay | Admitting: Family Medicine

## 2010-05-26 ENCOUNTER — Encounter: Payer: Self-pay | Admitting: Family Medicine

## 2010-06-19 ENCOUNTER — Encounter: Payer: Self-pay | Admitting: Family Medicine

## 2010-06-24 ENCOUNTER — Ambulatory Visit: Payer: Self-pay | Admitting: Family Medicine

## 2010-06-24 ENCOUNTER — Encounter: Payer: Self-pay | Admitting: Family Medicine

## 2010-09-12 ENCOUNTER — Ambulatory Visit: Payer: Self-pay | Admitting: Family Medicine

## 2010-09-12 DIAGNOSIS — N8112 Cystocele, lateral: Secondary | ICD-10-CM

## 2010-09-12 DIAGNOSIS — R1032 Left lower quadrant pain: Secondary | ICD-10-CM | POA: Insufficient documentation

## 2010-09-12 HISTORY — DX: Cystocele, lateral: N81.12

## 2010-09-12 LAB — CONVERTED CEMR LAB
Bilirubin Urine: NEGATIVE
Ketones, urine, test strip: NEGATIVE
Nitrite: NEGATIVE
Specific Gravity, Urine: 1.015

## 2010-09-14 ENCOUNTER — Telehealth: Payer: Self-pay | Admitting: Family Medicine

## 2010-09-15 ENCOUNTER — Ambulatory Visit: Payer: Self-pay | Admitting: Family Medicine

## 2010-09-15 DIAGNOSIS — M25559 Pain in unspecified hip: Secondary | ICD-10-CM | POA: Insufficient documentation

## 2010-09-15 DIAGNOSIS — R109 Unspecified abdominal pain: Secondary | ICD-10-CM

## 2010-09-15 DIAGNOSIS — M79609 Pain in unspecified limb: Secondary | ICD-10-CM

## 2010-09-15 HISTORY — DX: Pain in unspecified hip: M25.559

## 2010-09-15 LAB — CONVERTED CEMR LAB
Basophils Relative: 0.5 % (ref 0.0–3.0)
Eosinophils Absolute: 0.4 10*3/uL (ref 0.0–0.7)
Eosinophils Relative: 4.2 % (ref 0.0–5.0)
Hemoglobin: 13.2 g/dL (ref 12.0–15.0)
Lymphocytes Relative: 27.2 % (ref 12.0–46.0)
MCHC: 33.4 g/dL (ref 30.0–36.0)
Monocytes Relative: 6.5 % (ref 3.0–12.0)
Neutro Abs: 5.3 10*3/uL (ref 1.4–7.7)
Neutrophils Relative %: 61.6 % (ref 43.0–77.0)
RBC: 4.47 M/uL (ref 3.87–5.11)
WBC: 8.6 10*3/uL (ref 4.5–10.5)

## 2010-09-29 ENCOUNTER — Telehealth: Payer: Self-pay | Admitting: Family Medicine

## 2010-10-05 ENCOUNTER — Ambulatory Visit
Admission: RE | Admit: 2010-10-05 | Discharge: 2010-10-05 | Payer: Self-pay | Source: Home / Self Care | Attending: Family Medicine | Admitting: Family Medicine

## 2010-10-30 LAB — CONVERTED CEMR LAB
BUN: 14 mg/dL (ref 6–23)
Bilirubin Urine: NEGATIVE
CO2: 28 meq/L (ref 19–32)
Chloride: 106 meq/L (ref 96–112)
Cholesterol: 163 mg/dL (ref 0–200)
Glucose, Urine, Semiquant: NEGATIVE
Ketones, urine, test strip: NEGATIVE
Phosphorus: 3.8 mg/dL (ref 2.3–4.6)
Potassium: 4.8 meq/L (ref 3.5–5.3)
Specific Gravity, Urine: 1.025
Triglycerides: 89 mg/dL (ref <150)
VLDL: 18 mg/dL (ref 0–40)
pH: 6.5

## 2010-11-01 NOTE — Progress Notes (Signed)
Summary: UTI  Phone Note Call from Patient   Caller: Patient Call For: Judithann Sheen MD Summary of Call: Pt is having bladder pain, frequency, dysuria x 2 days.  Pt. wants RX. Rush[place] [phone] Initial call taken by: Lynann Beaver CMA,  March 29, 2010 4:04 PM  Follow-up for Phone Call        ov   Follow-up by: Pura Spice, RN,  March 29, 2010 4:18 PM  Additional Follow-up for Phone Call Additional follow up Details #1::        appt scheduled. Additional Follow-up by: Lynann Beaver CMA,  March 29, 2010 4:24 PM

## 2010-11-01 NOTE — Miscellaneous (Signed)
Summary: flu inj at [facility] on 05-25-2010    Clinical Lists Changes  Observations: Added new observation of FLU VAX: Historical (05/25/2010 10:07)      Immunization History:  Influenza Immunization History:    Influenza:  historical (05/25/2010) pt received at [facility]  [address] ....gh rn.........Marland Kitchen

## 2010-11-01 NOTE — Letter (Signed)
Summary: [facility]   Imported By: Maryln Gottron 06/01/2010 10:05:57  _____________________________________________________________________  External Attachment:    Type:   Image     Comment:   External Document

## 2010-11-01 NOTE — Miscellaneous (Signed)
Summary: Flu Shot/[facility]  [facility]   Imported By: Maryln Gottron 06/01/2010 14:20:12  _____________________________________________________________________  External Attachment:    Type:   Image     Comment:   External Document

## 2010-11-01 NOTE — Letter (Signed)
Summary: Eye Exam/[facility]  [facility]   Imported By: Maryln Gottron 05/31/2010 12:51:39  _____________________________________________________________________  External Attachment:    Type:   Image     Comment:   External Document

## 2010-11-01 NOTE — Progress Notes (Signed)
Summary: Pt req status of test Dr Scotty Court mentioned during ov  Phone Note Call from Patient Call back at [facility] Phone [phone] Call back at Pt says to call on cell. She said Dr Scotty Court has the #   Caller: Patient Summary of Call: Pt called and said that she is waiting to hear status of test that Dr. Scotty Court had mentioned during ov.  Please call asap today.  Initial call taken by: Lucy Antigua,  November 04, 2009 12:32 PM  Follow-up for Phone Call        will call pt Follow-up by: Judithann Sheen MD,  November 09, 2009 5:26 PM

## 2010-11-01 NOTE — Assessment & Plan Note (Signed)
Summary: painful uti/freq urination/cjr   Vital Signs:  Patient profile:   77 year old female Weight:      223 pounds BMI:     34.03 O2 Sat:      95 % Temp:     98.4 degrees F Pulse rate:   70 / minute BP sitting:   140 / 88  (left arm) Cuff size:   large  Vitals Entered By: Pura Spice, RN (October 07, 2009 4:17 PM) CC:  pt thinks has urinary tract infection and med refills  Is Patient Diabetic? Yes Did you bring your meter with you today? No   History of Present Illness: This 77 year old white female is in today complaining of urinary frequent dysuria urinary incontinence and urgency over the past 7-10 days increasing in severity Blood pressure 160/96 on arrival and then check later was 142/90 Patient needs multiple medicines refilled Needs handicap form filled out Had long discussion with patient regarding  son and he has had DVT resulting in pulmonary emboli ABGs 100-120   Problems Prior to Update: 1)  Diplopia  (ICD-368.2) 2)  Blurred Vision  (ICD-368.8) 3)  Hyperkeratosis  (ICD-701.1) 4)  Nausea  (ICD-787.02) 5)  Nephrolithiasis  (ICD-592.0) 6)  Uti  (ICD-599.0) 7)  Contact Dermatitis  (ICD-692.9) 8)  Dyspnea On Exertion  (ICD-786.09) 9)  Irritable Bowel Syndrome  (ICD-564.1) 10)  Stomatitis and Mucositis Unspecified  (ICD-528.00) 11)  Peripheral Vascular Disease  (ICD-443.9) 12)  Edema  (ICD-782.3) 13)  Otitis Media  (ICD-382.9) 14)  Acute Cystitis  (ICD-595.0) 15)  Chest Pain  (ICD-786.50) 16)  Edema  (ICD-782.3) 17)  Diabetes Mellitus, Type II  (ICD-250.00) 18)  Narcolepsy Without Cataplexy  (ICD-347.00) 19)  Hyperlipidemia  (ICD-272.4) 20)  Syncope  (ICD-780.2) 21)  Uti  (ICD-599.0) 22)  Back Pain, Chronic  (ICD-724.5) 23)  Hypoglycemia  (ICD-251.2) 24)  Bacterial Vaginitis  (ICD-616.10) 25)  Blurred Vision  (ICD-368.8) 26)  Anemia  (ICD-285.9) 27)  Costochondritis, Left  (ICD-733.6) 28)  Abscess  (ICD-682.9) 29)  Breast Cyst, Right   (ICD-610.0) 30)  Arthritis  (ICD-716.90) 31)  Contused Toe  () 32)  Esophageal Reflux  (ICD-530.81) 33)  Diarrhea, Chronic  (ICD-787.91) 34)  Onychomycosis  (ICD-110.1) 35)  Degenerative Joint Disease  (ICD-715.90) 36)  Coronary Artery Disease  (ICD-414.00) 37)  Obesity  (ICD-278.00) 38)  Syndrome, Restless Legs  (ICD-333.94) 39)  Hypertension  (ICD-401.9) 40)  Depression  (ICD-311)  Allergies: 1)  ! * Pencillin 2)  ! * Cleomycin 3)  ! Sulfa  Past History:  Past Medical History: Last updated: 12/23/2007 Depression Diabetes mellitus, type I Hypertension Coronary artery disease Current Problems:  ESOPHAGEAL REFLUX (ICD-530.81) DIARRHEA, CHRONIC (ICD-787.91) ONYCHOMYCOSIS (ICD-110.1) DEGENERATIVE JOINT DISEASE (ICD-715.90) CORONARY ARTERY DISEASE (ICD-414.00) OBESITY (ICD-278.00) SYNDROME, RESTLESS LEGS (ICD-333.94) HYPERTENSION (ICD-401.9) Colon Polyp Diverticulosis DIABETES MELLITUS, TYPE I (ICD-250.01) DEPRESSION (ICD-311)  Past Surgical History: Last updated: 08/03/2009 Cataract extraction Hysterectomy TonsillectomylL Low Back Surgery  X 2  cardiac cath oct 2010  Risk Factors: Smoking Status: never (03/01/2007)  Review of Systems  The patient denies anorexia, fever, weight loss, weight gain, vision loss, decreased hearing, hoarseness, chest pain, syncope, dyspnea on exertion, peripheral edema, prolonged cough, headaches, hemoptysis, abdominal pain, melena, hematochezia, severe indigestion/heartburn, hematuria, incontinence, genital sores, muscle weakness, suspicious skin lesions, transient blindness, difficulty walking, depression, unusual weight change, abnormal bleeding, enlarged lymph nodes, angioedema, breast masses, and testicular masses.    Physical Exam  General:  Well-developed,well-nourished,in no acute distress; alert,appropriate  and cooperative throughout examinationoverweight-appearing.   Eyes:  pupils reactive to light.   Lungs:  Normal  respiratory effort, chest expands symmetrically. Lungs are clear to auscultation, no crackles or wheezes. Heart:  Normal rate and regular rhythm. S1 and S2 normal without gallop, murmur, click, rub or other extra sounds. Abdomen:  right CVA tenderness, left CVA negative Suprapubic tenderness Genitalia:  on exam Extremities:  left pretibial edema and right pretibial edema.     Impression & Recommendations:  Problem # 1:  CYSTITIS, ACUTE (ICD-595.0) Assessment New  Her updated medication list for this problem includes:    Vesicare 5 Mg Tabs (Solifenacin succinate) .Marland Kitchen... Take 1 tablet by mouth once a day    Ciprofloxacin Hcl 500 Mg Tabs (Ciprofloxacin hcl) .Marland Kitchen... 1 two times a day for urinary tact infection  Orders: Prescription Created Electronically [phone])  Problem # 2:  PERIPHERAL VASCULAR DISEASE (ICD-443.9) Assessment: Unchanged  Problem # 3:  EDEMA (ICD-782.3) Assessment: Unchanged  Her updated medication list for this problem includes:    Furosemide 40 Mg Tabs (Furosemide) .Marland Kitchen... 2 am 2 midafternoon  Problem # 4:  DIABETES MELLITUS, TYPE II (ICD-250.00) Assessment: Unchanged  Her updated medication list for this problem includes:    Glucotrol Xl 10 Mg Tb24 (Glipizide) .Marland Kitchen... Take 2 tablets once daily    Lantus Solostar 100 Unit/ml Soln (Insulin glargine) ..... Inject 80 units sd daily    Azor 10-20 Mg Tabs (Amlodipine-olmesartan) .Marland Kitchen... 1 by mouth once daily  Problem # 5:  NARCOLEPSY WITHOUT CATAPLEXY (ICD-347.00) Assessment: Improved  Problem # 6:  BACK PAIN, CHRONIC (ICD-724.5) Assessment: Improved  Her updated medication list for this problem includes:    Hydrocodone-acetaminophen 10-650 Mg Tabs (Hydrocodone-acetaminophen) .Marland Kitchen... Take 1 tablet by mouth every four to six hours no early refill    Flexeril 10 Mg Tabs (Cyclobenzaprine hcl) .Marland Kitchen... 1 morn mid afternoon and hs for muscle spasm  Problem # 7:  ARTHRITIS (ICD-716.90) Assessment: Unchanged  Problem # 8:   SYNDROME, RESTLESS LEGS (ICD-333.94) Assessment: Improved  Complete Medication List: 1)  Glucotrol Xl 10 Mg Tb24 (Glipizide) .... Take 2 tablets once daily 2)  Hydrocodone-acetaminophen 10-650 Mg Tabs (Hydrocodone-acetaminophen) .... Take 1 tablet by mouth every four to six hours no early refill 3)  Lantus Solostar 100 Unit/ml Soln (Insulin glargine) .... Inject 80 units sd daily 4)  Mirapex 1 Mg Tabs (Pramipexole dihydrochloride) .... Three times a day 5)  Promethazine Hcl 25 Mg Tabs (Promethazine hcl) .... Take 1 tablet by mouth every  4 hrs as needed nausea 6)  Vesicare 5 Mg Tabs (Solifenacin succinate) .... Take 1 tablet by mouth once a day 7)  Histinex Hc  .... 2 tsp every 4 hrs as needed cough 8)  Ambien 10 Mg Tabs (Zolpidem tartrate) .Marland Kitchen.. 1 hs for sleep 9)  Protonix 40 Mg Tbec (Pantoprazole sodium) .... Once daily 10)  Flexeril 10 Mg Tabs (Cyclobenzaprine hcl) .Marland Kitchen.. 1 morn mid afternoon and hs for muscle spasm 11)  Dextroamphetamine Sulfate Cr 10 Mg Xr24h-cap (Dextroamphetamine sulfate) .... 2 each am for narcolepsy, 12)  Metrogel-vaginal 0.75 % Gel (Metronidazole) .... Insert 1 applicator hs, repear if needed vaginitis 13)  Zoloft 100 Mg Tabs (Sertraline hcl) .Marland Kitchen.. 1 once daily for depression 14)  Fluconazole 150 Mg Tabs (Fluconazole) .Marland Kitchen.. 1  now then 1 in 1 week for yeat 15)  Azor 10-20 Mg Tabs (Amlodipine-olmesartan) .Marland Kitchen.. 1 by mouth once daily 16)  Furosemide 40 Mg Tabs (Furosemide) .... 2 am 2 midafternoon 17)  Hyomax-sr  0.375 Mg Xr12h-tab (Hyoscyamine sulfate) .Marland Kitchen.. 1 by mouth two times a day 18)  Lomotil 2.5-0.025 Mg Tabs (Diphenoxylate-atropine) 19)  Magicmouthwash Hc  .Marland Kitchen.. 1 tsp in mouth, rinse gargle and swallow 20)  Pramipexole Dihydrochloride 1 Mg Tabs (Pramipexole dihydrochloride) .Marland Kitchen.. 1 by mouth three times a day 21)  Maxifed 60-400 Mg Tabs (Pseudoephedrine-guaifenesin) .Marland Kitchen.. 1 by mouth two times a day 22)  Phenflu Cdx 10-20-400-500 Mg Tabs  (Phenylephrine-codeine-gg-apap) 23)  Lamisil 250 Mg Tabs (Terbinafine hcl) .Marland Kitchen.. 1 by mouth once daily 24)  Ciprofloxacin Hcl 500 Mg Tabs (Ciprofloxacin hcl) .Marland Kitchen.. 1 two times a day for urinary tact infection 25)  Voltaren 1 % Gel (Diclofenac sodium) .... Apply tid  Other Orders: UA Dipstick w/o Micro (automated)  (81003)  Patient Instructions: 1)  severe cystitis 2)  ciprofloxin 500 mg two times a day 3)   for infection 4)  pyridinum 200 mg three times a day pc for burning 5)  refilled other n medications Prescriptions: VOLTAREN 1 % GEL (DICLOFENAC SODIUM) apply tid  #90 gms x 11   Entered and Authorized by:   Judithann Sheen MD   Signed by:   Judithann Sheen MD on 10/07/2009   Method used:   Electronically to        [facility] [address] [phone]* (retail)       [address]       [address]       [place], [place]  [address]       Ph: [phone]       Fax: [phone]   RxID:   [id number] AMBIEN 10 MG  TABS (ZOLPIDEM TARTRATE) 1 hs for sleep  #30 x 5   Entered and Authorized by:   Judithann Sheen MD   Signed by:   Judithann Sheen MD on 10/07/2009   Method used:   Print then Give to Patient   RxID:   [id number] DEXTROAMPHETAMINE SULFATE CR 10 MG XR24H-CAP (DEXTROAMPHETAMINE SULFATE) 2 each AM for narcolepsy,  #60 x 0   Entered and Authorized by:   Judithann Sheen MD   Signed by:   Judithann Sheen MD on 10/07/2009   Method used:   Print then Give to Patient   RxID:   [id number] MIRAPEX 1 MG TABS (PRAMIPEXOLE DIHYDROCHLORIDE) three times a day  #90 x 11   Entered and Authorized by:   Judithann Sheen MD   Signed by:   Judithann Sheen MD on 10/07/2009   Method used:   Electronically to        [facility] [address] [phone]* (retail)       [address]       [address]       [place], [place]  [address]       Ph: [phone]       Fax: [phone]   RxID:   [id number] VESICARE 5 MG TABS (SOLIFENACIN SUCCINATE)  Take 1 tablet by mouth once a day  #30 x 11   Entered and Authorized by:   Judithann Sheen MD   Signed by:   Judithann Sheen MD on 10/07/2009   Method used:   Electronically to        [facility] [address] [phone]* (retail)       [address]       [address]       [place], [place]  [address]       Ph: [phone]       Fax: [phone]   RxID:   [id number] CIPROFLOXACIN HCL 500 MG TABS (CIPROFLOXACIN HCL) 1 two times a day for urinary tact infection  #30 x 1   Entered and Authorized by:   Judithann Sheen MD   Signed by:   Judithann Sheen MD on 10/07/2009   Method used:   Electronically to        [facility] [address] [phone]* (retail)       [address]       [address]       [place], [place]  [address]       Ph: [phone]       Fax: [phone]   RxID:   [id number]   Laboratory Results   Urine Tests    Routine Urinalysis   Color: yellow Appearance: Cloudy Glucose: negative   (Normal Range: Negative) Bilirubin: 1+   (Normal Range: Negative) Ketone: negative   (Normal Range: Negative) Spec. Gravity: 1.025   (Normal Range: 1.003-1.035) Blood: 3+   (Normal Range: Negative) pH: 5.5   (Normal Range: 5.0-8.0) Protein: 2+   (Normal Range: Negative) Urobilinogen: 0.2   (Normal Range: 0-1) Nitrite: negative   (Normal Range: Negative) Leukocyte Esterace: 3+   (Normal Range: Negative)    Comments: Not enough sample to culture. Joanne Chars CMA  October 07, 2009 4:16 PM

## 2010-11-01 NOTE — Progress Notes (Signed)
Summary: [facility]rol [facility] [address]   Phone Note Call from Patient   Caller: Patient Call For: Judithann Sheen MD Summary of Call: Needs Glucotrol XL 10 mg. to [facility])  Initial call taken by: Lynann Beaver CMA,  Feb 15, 2010 9:22 AM  Follow-up for Phone Call        pt stes been out of med for 1 week and wants refilled always at [facility] [address]. ok per dr Scotty Court  Follow-up by: Pura Spice, RN,  Feb 15, 2010 9:34 AM

## 2010-11-01 NOTE — Assessment & Plan Note (Signed)
Summary: legs swelling/painful and tight/cjr   Vital Signs:  Patient profile:   77 year old female Weight:      235 pounds BMI:     35.86 O2 Sat:      94 % Temp:     98 degrees F Pulse rate:   88 / minute BP sitting:   120 / 82  (left arm) Cuff size:   large  Vitals Entered By: Pura Spice, RN (November 30, 2009 5:04 PM) CC: swelling feet ankles  don't think furosemide is working  Is Patient Diabetic? No   History of Present Illness: this 77 year old white female known diabetic and hypertensive, with coronary artery disease Z. problem with edema of both legs and feet over the last month improvement then recur as of the past week she is to come fairly swollen even on right leg some areas of weeping there She doesn't think that furosemide is doing too well since she had 60 mg on this day. She has had some superficial veins surgery by [facility]. Blood pressure had been controlled at 120/80 She has had no symptoms of congestive heart failure with no cough and shortness of breath, only peripheral edema Relates her CBGs are 110-140  Allergies: 1)  ! * Pencillin 2)  ! * Cleomycin 3)  ! Sulfa  Past History:  Past Medical History: Last updated: 12/23/2007 Depression Diabetes mellitus, type I Hypertension Coronary artery disease Current Problems:  ESOPHAGEAL REFLUX (ICD-530.81) DIARRHEA, CHRONIC (ICD-787.91) ONYCHOMYCOSIS (ICD-110.1) DEGENERATIVE JOINT DISEASE (ICD-715.90) CORONARY ARTERY DISEASE (ICD-414.00) OBESITY (ICD-278.00) SYNDROME, RESTLESS LEGS (ICD-333.94) HYPERTENSION (ICD-401.9) Colon Polyp Diverticulosis DIABETES MELLITUS, TYPE I (ICD-250.01) DEPRESSION (ICD-311)  Past Surgical History: Last updated: 08/03/2009 Cataract extraction Hysterectomy TonsillectomylL Low Back Surgery  X 2  cardiac cath oct 2010  Risk Factors: Smoking Status: never (03/01/2007)  Physical Exam  General:  Well-developed,well-nourished,in no acute distress;  alert,appropriate and cooperative throughout examination Lungs:  Normal respiratory effort, chest expands symmetrically. Lungs are clear to auscultation, no crackles or wheezes. Heart:  Normal rate and regular rhythm. S1 and S2 normal without gallop, murmur, click, rub or other extra sounds. Abdomen:  Bowel sounds positive,abdomen soft and non-tender without masses, organomegaly or hernias noted. Extremities:  4+ pretibial edema right leg lower leg with some weeping 3-4+ pretibial edema left leg No calf tenderness   Impression & Recommendations:  Problem # 1:  PERIPHERAL EDEMA (ICD-782.3) Assessment New  Her updated medication list for this problem includes:    Furosemide 40 Mg Tabs (Furosemide) .Marland Kitchen... 2 am 2 midafternoon    Hydrochlorothiazide 50 Mg Tabs (Hydrochlorothiazide) .Marland Kitchen... 2 now then  2 in morning then 1 in midafternoon  Orders: Doppler Referral (Doppler) Prescription Created Electronically [phone])  Problem # 2:  PERIPHERAL VASCULAR DISEASE (ICD-443.9) Assessment: Deteriorated  Her updated medication list for this problem includes:    Plavix 75 Mg Tabs (Clopidogrel bisulfate) .Marland Kitchen... 1 once daily to prevent clots  Problem # 3:  DIABETES MELLITUS, TYPE II (ICD-250.00) Assessment: Unchanged  Her updated medication list for this problem includes:    Glucotrol Xl 10 Mg Tb24 (Glipizide) .Marland Kitchen... Take 2 tablets once daily    Lantus Solostar 100 Unit/ml Soln (Insulin glargine) ..... Inject 80 units sd daily    Azor 10-20 Mg Tabs (Amlodipine-olmesartan) .Marland Kitchen... 1 once daily for hypertensipon  Problem # 4:  NARCOLEPSY WITHOUT CATAPLEXY (ICD-347.00) Assessment: Unchanged  Problem # 5:  DEGENERATIVE JOINT DISEASE (ICD-715.90) Assessment: Unchanged  Her updated medication list for this problem includes:  Hydrocodone-acetaminophen 10-650 Mg Tabs (Hydrocodone-acetaminophen) .Marland Kitchen... Take 1 tablet by mouth every four to six hours no early refill  Problem # 6:  CORONARY ARTERY DISEASE  (ICD-414.00) Assessment: Unchanged  Her updated medication list for this problem includes:    Furosemide 40 Mg Tabs (Furosemide) .Marland Kitchen... 2 am 2 midafternoon    Azor 10-20 Mg Tabs (Amlodipine-olmesartan) .Marland Kitchen... 1 once daily for hypertensipon    Plavix 75 Mg Tabs (Clopidogrel bisulfate) .Marland Kitchen... 1 once daily to prevent clots    Hydrochlorothiazide 50 Mg Tabs (Hydrochlorothiazide) .Marland Kitchen... 2 now then  2 in morning then 1 in midafternoon  Complete Medication List: 1)  Glucotrol Xl 10 Mg Tb24 (Glipizide) .... Take 2 tablets once daily 2)  Hydrocodone-acetaminophen 10-650 Mg Tabs (Hydrocodone-acetaminophen) .... Take 1 tablet by mouth every four to six hours no early refill 3)  Lantus Solostar 100 Unit/ml Soln (Insulin glargine) .... Inject 80 units sd daily 4)  Promethazine Hcl 25 Mg Tabs (Promethazine hcl) .... Take 1 tablet by mouth every  4 hrs as needed nausea 5)  Vesicare 5 Mg Tabs (Solifenacin succinate) .... Take 1 tablet by mouth once a day 6)  Ambien 10 Mg Tabs (Zolpidem tartrate) .Marland Kitchen.. 1 hs for sleep 7)  Protonix 40 Mg Tbec (Pantoprazole sodium) .... Once daily 8)  Flexeril 10 Mg Tabs (Cyclobenzaprine hcl) .Marland Kitchen.. 1 morn mid afternoon and hs for muscle spasm 9)  Dextroamphetamine Sulfate Cr 10 Mg Xr24h-cap (Dextroamphetamine sulfate) .... 2 each am for narcolepsy, 10)  Metrogel-vaginal 0.75 % Gel (Metronidazole) .... Insert 1 applicator hs, repear if needed vaginitis 11)  Zoloft 100 Mg Tabs (Sertraline hcl) .Marland Kitchen.. 1 once daily for depression 12)  Fluconazole 150 Mg Tabs (Fluconazole) .Marland Kitchen.. 1  now then 1 in 1 week for yeat 13)  Furosemide 40 Mg Tabs (Furosemide) .... 2 am 2 midafternoon 14)  Lomotil 2.5-0.025 Mg Tabs (Diphenoxylate-atropine) 15)  Magicmouthwash Hc  .Marland Kitchen.. 1 tsp in mouth, rinse gargle and swallow 16)  Pramipexole Dihydrochloride 1 Mg Tabs (Pramipexole dihydrochloride) .Marland Kitchen.. 1 by mouth three times a day 17)  Lamisil 250 Mg Tabs (Terbinafine hcl) .Marland Kitchen.. 1 by mouth once daily 18)   Ciprofloxacin Hcl 500 Mg Tabs (Ciprofloxacin hcl) .Marland Kitchen.. 1 two times a day for urinary tact infection 19)  Voltaren 1 % Gel (Diclofenac sodium) .... Apply tid 20)  Pyridium 200 Mg Tabs (Phenazopyridine hcl) .... One by mouth three times a day dysuria 21)  Azor 10-20 Mg Tabs (Amlodipine-olmesartan) .Marland Kitchen.. 1 once daily for hypertensipon 22)  Plavix 75 Mg Tabs (Clopidogrel bisulfate) .Marland Kitchen.. 1 once daily to prevent clots 23)  Hydrochlorothiazide 50 Mg Tabs (Hydrochlorothiazide) .... 2 now then  2 in morning then 1 in midafternoon  Other Orders: TLB-BMP (Basic Metabolic Panel-BMET) (80048-[id number])  Patient Instructions: 1)  Lie down and elevte legs as high as you can and be comfortale enough to sllep 2)  To wrap uour legs with ace bandages 3)  Hydroclorothiazde 50 mg three times a day until legs are down 4)  start with 100 mg this afternoo 5)  eat bannas , orange juice occasionally 6)  2 schedule Doppler bilateral leg Prescriptions: HYDROCHLOROTHIAZIDE 50 MG TABS (HYDROCHLOROTHIAZIDE) 2 now then  2 in morning then 1 in midafternoon  #90 x 6   Entered and Authorized by:   Judithann Sheen MD   Signed by:   Judithann Sheen MD on 11/30/2009   Method used:   Electronically to        [facility] [address] #  [id number]* (retail)       [address]       [address]       [place], [place]  [address]       Ph: [phone]       Fax: [phone]   RxID:   [id number]

## 2010-11-01 NOTE — Miscellaneous (Signed)
Summary: Orders Update  Clinical Lists Changes 

## 2010-11-01 NOTE — Assessment & Plan Note (Signed)
Summary: BLOOD SUGAR DROPPING/MHF   Vital Signs:  Patient Profile:   77 Years Old Female Weight:      212.8 pounds O2 Sat:      94 % Temp:     98.3 degrees F Pulse rate:   69 / minute Resp:     16 per minute BP sitting:   120 / 80  (left arm) Cuff size:   large  Vitals Entered By: Pura Spice, RN (June 23, 2008 11:26 AM)                 Chief Complaint:  stataed went to [facility] for hypoglycemia episode.  History of Present Illness: Pt was admitted to hospital, [facility] following hypoglycemic episode and bizarre chest pain which was not explained Relates has had noncompliance tc diabetes Saw D[facility] , no surgery, referred to Dr. Ethelene Hal for epidural injections which have helped tremendously no other complaints    Current Allergies: ! * PENCILLIN ! * CLEOMYCIN ! SULFA     Review of Systems      See HPI   Physical Exam  General:     Well-developed,well-nourished,in no acute distress; alert,appropriate and cooperative throughout examinationoverweight-appearing.   Lungs:     Normal respiratory effort, chest expands symmetrically. Lungs are clear to auscultation, no crackles or wheezes. Heart:     Normal rate and regular rhythm. S1 and S2 normal without gallop, murmur, click, rub or other extra sounds. Abdomen:     Bowel sounds positive,abdomen soft and non-tender without masses, organomegaly or hernias noted. Msk:     back unchanged Extremities:     No clubbing, cyanosis, edema, or deformity noted with normal full range of motion of all joints.      Impression & Recommendations:  Problem # 1:  HYPOGLYCEMIA (ICD-251.2) Assessment: New  Problem # 2:  BACK PAIN, CHRONIC (ICD-724.5) Assessment: Unchanged  The following medications were removed from the medication list:    Skelaxin 800 Mg Tabs (Metaxalone) .Marland Kitchen... Three times a day  Her updated medication list for this problem includes:    Hydrocodone-acetaminophen 10-650 Mg Tabs  (Hydrocodone-acetaminophen) .Marland Kitchen... Take 1 tablet by mouth every four to six hours no early refill    Flexeril 10 Mg Tabs (Cyclobenzaprine hcl) .Marland Kitchen... 1 morn mid afternoon and hs for muscle spasm   Problem # 3:  ESOPHAGEAL REFLUX (ICD-530.81) Assessment: Improved  The following medications were removed from the medication list:    Nexium 40 Mg Cpdr (Esomeprazole magnesium) .Marland Kitchen... Take 1 capsule by mouth once a day  Her updated medication list for this problem includes:    Protonix 40 Mg Tbec (Pantoprazole sodium) ..... Once daily   Problem # 4:  DIABETES MELLITUS, TYPE I (ICD-250.01) Assessment: Deteriorated  Her updated medication list for this problem includes:    Glucotrol Xl 10 Mg Tb24 (Glipizide) .Marland Kitchen... Take 2 tablets once daily    Lantus Solostar 100 Unit/ml Soln (Insulin glargine) ..... Inject100 uunit subcutaneously  once a day    Glucophage 500 Mg Tabs (Metformin hcl) .Marland Kitchen..Marland Kitchen Two times a day   Problem # 5:  HYPERTENSION (ICD-401.9) Assessment: Improved  Her updated medication list for this problem includes:    Maxzide 75-50 Mg Tabs (Triamterene-hctz) .Marland Kitchen... 1 once daily  as needed fluid    Metoprolol Tartrate 25 Mg Tabs (Metoprolol tartrate) .Marland Kitchen..Marland Kitchen Two times a day   Complete Medication List: 1)  Glucotrol Xl 10 Mg Tb24 (Glipizide) .... Take 2 tablets once daily 2)  Hydrocodone-acetaminophen  10-650 Mg Tabs (Hydrocodone-acetaminophen) .... Take 1 tablet by mouth every four to six hours no early refill 3)  Lantus Solostar 100 Unit/ml Soln (Insulin glargine) .... Inject100 uunit subcutaneously  once a day 4)  Mirapex 1 Mg Tabs (Pramipexole dihydrochloride) .... Three times a day 5)  Promethazine Hcl 25 Mg Tabs (Promethazine hcl) .... Take 1 tablet by mouth every six hours 6)  Terbinafine Hcl 250 Mg Tabs (Terbinafine hcl) .... Take 1 tablet by mouth once a day 7)  Vesicare 5 Mg Tabs (Solifenacin succinate) .... Take 1 tablet by mouth once a day 8)  Histinex Hc  .... 2 tsp every 4  hrs as needed cough 9)  Ambien 10 Mg Tabs (Zolpidem tartrate) .Marland Kitchen.. 1 hs for sleep 10)  Maxzide 75-50 Mg Tabs (Triamterene-hctz) .Marland Kitchen.. 1 once daily  as needed fluid 11)  Metoprolol Tartrate 25 Mg Tabs (Metoprolol tartrate) .... Two times a day 12)  Protonix 40 Mg Tbec (Pantoprazole sodium) .... Once daily 13)  Glucophage 500 Mg Tabs (Metformin hcl) .... Two times a day 14)  Flexeril 10 Mg Tabs (Cyclobenzaprine hcl) .Marland Kitchen.. 1 morn mid afternoon and hs for muscle spasm   Patient Instructions: 1)  take meto units hsprolol 12.5 mg in AM and 25 mg hs 2)  Lantus 70 units hs 3)  take sugars  in  AM, keep record over nxt 2 weeks and call me regarding results 4)  Discontinue skelexi, start flexeril generic 10 mg three times a day 5)  rEfilled  hydrocodon and ambien   Prescriptions: AMBIEN 10 MG  TABS (ZOLPIDEM TARTRATE) 1 hs for sleep  #30 x 5   Entered and Authorized by:   Judithann Sheen MD   Signed by:   Judithann Sheen MD on 06/23/2008   Method used:   Print then Give to Patient   RxID:   [id number] HYDROCODONE-ACETAMINOPHEN 10-650 MG TABS (HYDROCODONE-ACETAMINOPHEN) Take 1 tablet by mouth every four to six hours no early refill  #120 x 5   Entered and Authorized by:   Judithann Sheen MD   Signed by:   Judithann Sheen MD on 06/23/2008   Method used:   Print then Give to Patient   RxID:   [id number] FLEXERIL 10 MG TABS (CYCLOBENZAPRINE HCL) 1 morn mid afternoon and hs for muscle spasm  #90 x 11   Entered and Authorized by:   Judithann Sheen MD   Signed by:   Judithann Sheen MD on 06/23/2008   Method used:   Electronically to        [facility] [address]* (retail)       [address]       [place], [place]  [address]       Ph: [phone]       Fax: [phone]   RxID:   [id number]  ]

## 2010-11-01 NOTE — Progress Notes (Signed)
Summary: referral question  Phone Note Call from Patient   Caller: Patient Call For: Judithann Sheen MD Summary of Call: Pt wants to know the name and phone number of the MD that Dr. Scotty Court was talking about referring her to.Marland KitchenMarland KitchenMarland KitchenMay leave message on her phone.  [phone] Initial call taken by: Lynann Beaver CMA,  December 09, 2009 11:44 AM  Follow-up for Phone Call        tell her doppler studies were negative and find out if edema better.  Follow-up by: Pura Spice, RN,  December 09, 2009 12:16 PM  Additional Follow-up for Phone Call Additional follow up Details #1::        legs not better; she's doing all as instructed, says her whole legs are swollen  Additional Follow-up by: Raechel Ache, RN,  December 09, 2009 12:27 PM    Additional Follow-up for Phone Call Additional follow up Details #2::    dr Alfonzo Feller will refer to dr todd early and terri will call with appt  Follow-up by: Pura Spice, RN,  December 09, 2009 1:17 PM  Additional Follow-up for Phone Call Additional follow up Details #3:: Details for Additional Follow-up Action Taken: Pt notified. Additional Follow-up by: Lynann Beaver CMA,  December 09, 2009 1:21 PM

## 2010-11-01 NOTE — Progress Notes (Signed)
Summary: appt from dr early got resch pls call   Phone Note Call from Patient   Reason for Call: Acute Illness Summary of Call: pt states her appointment w/ Dr Arbie Cookey is not until May 27 ( they called her today).  She doesnt think she can wait that long be cause her legs are swollen so badly and painful.  requesting a reply from [facility] or Dr Scotty Court. Initial call taken by: Triage Nurse,[place]  Follow-up for Phone Call        dr stafford said call them and tell them you can not wait and you are willing to see the first available.  Follow-up by: Pura Spice, RN,  January 06, 2010 10:37 AM  Additional Follow-up for Phone Call Additional follow up Details #1::        Pt. notified. Additional Follow-up by: Lynann Beaver CMA,  January 06, 2010 11:15 AM

## 2010-11-01 NOTE — Assessment & Plan Note (Signed)
Summary: exposure to shingles/dm   Vital Signs:  Patient profile:   77 year old female Weight:      237 pounds Temp:     98.0 degrees F oral BP sitting:   150 / 80  (left arm) Cuff size:   large  Vitals Entered By: Sid Falcon LPN (June 24, 2010 3:34 PM)  History of Present Illness: Patient here with concerned about exposure to someone with shingles.  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 Patient has had prior chickenpox but no history of shingles herself. She does not have any current rashes. Does have separate issue of asymptomatic subconjunctival hemorrhage left eye noted 2 days ago. No blurred vision and no eye pain.  She has questions regarding shingles vaccine and would like to get this if covered by her insurance. She does not have any allergies to neomycin or gelatin. No immune problems.  Allergies: 1)  ! * Pencillin 2)  ! * Cleomycin 3)  ! Sulfa  Past History:  Past Medical History: Last updated: 12/23/2007 Depression Diabetes mellitus, type I Hypertension Coronary artery disease Current Problems:  ESOPHAGEAL REFLUX (ICD-530.81) DIARRHEA, CHRONIC (ICD-787.91) ONYCHOMYCOSIS (ICD-110.1) DEGENERATIVE JOINT DISEASE (ICD-715.90) CORONARY ARTERY DISEASE (ICD-414.00) OBESITY (ICD-278.00) SYNDROME, RESTLESS LEGS (ICD-333.94) HYPERTENSION (ICD-401.9) Colon Polyp Diverticulosis DIABETES MELLITUS, TYPE I (ICD-250.01) DEPRESSION (ICD-311)  Past Surgical History: Last updated: 08/03/2009 Cataract extraction Hysterectomy TonsillectomylL Low Back Surgery  X 2  cardiac cath oct 2010  Risk Factors: Smoking Status: never (03/01/2007) PMH-FH-SH reviewed for relevance  Review of Systems  The patient denies fever, weight loss, vision loss, chest pain, headaches, abdominal pain, and suspicious skin lesions.    Physical Exam  General:  Well-developed,well-nourished,in no acute distress; alert,appropriate and cooperative  throughout examination Head:  Normocephalic and atraumatic without obvious abnormalities. No apparent alopecia or balding. Eyes:  small left subconjunctival hemorrhage. Pupils equal round reactive to light. Conjunctivae appear normal Ears:  External ear exam shows no significant lesions or deformities.  Otoscopic examination reveals clear canals, tympanic membranes are intact bilaterally without bulging, retraction, inflammation or discharge. Hearing is grossly normal bilaterally. Mouth:  Oral mucosa and oropharynx without lesions or exudates.  Teeth in good repair. Neck:  No deformities, masses, or tenderness noted. Lungs:  Normal respiratory effort, chest expands symmetrically. Lungs are clear to auscultation, no crackles or wheezes. Heart:  normal rate and regular rhythm.   Skin:  no rashes.     Impression & Recommendations:  Problem # 1:  SUBCONJUNCTIVAL HEMORRHAGE (ICD-372.72) Assessment New benign and pt reassured.  Problem # 2:  NEED PROPH VACC&INOCULAT AGNST OTH SPEC DISEASE (ICD-V05.8) Assessment: Improved Long discussion regarding shingles vaccine.  We checked and she has $85 copay with her insurance and she would like to proceed.  Complete Medication List: 1)  Glucotrol Xl 10 Mg Tb24 (Glipizide) .... Take 2 tablets once daily 2)  Hydrocodone-acetaminophen 10-650 Mg Tabs (Hydrocodone-acetaminophen) .... Take 1 tablet by mouth every four to six hours no early refill 3)  Lantus Solostar 100 Unit/ml Soln (Insulin glargine) .... Inject 80 units sd daily 4)  Promethazine Hcl 25 Mg Tabs (Promethazine hcl) .... Take 1 tablet by mouth every  4 hrs as needed nausea 5)  Vesicare 5 Mg Tabs (Solifenacin succinate) .... Take 1 tablet by mouth once a day 6)  Ambien 10 Mg Tabs (Zolpidem tartrate) .Marland Kitchen.. 1 hs for sleep 7)  Flexeril 10 Mg Tabs (Cyclobenzaprine hcl) .Marland Kitchen.. 1 morn mid afternoon and hs for muscle  spasm 8)  Dextroamphetamine Sulfate Cr 10 Mg Xr24h-cap (Dextroamphetamine sulfate) .... 2  each am for narcolepsy, 9)  Fluconazole 150 Mg Tabs (Fluconazole) .... As needed 10)  Lomotil 2.5-0.025 Mg Tabs (Diphenoxylate-atropine) .Marland Kitchen.. 1-2 qid as needed diarrhea 11)  Magicmouthwash Hc  .Marland Kitchen.. 1 tsp in mouth, rinse gargle and swallow 12)  Pramipexole Dihydrochloride 1 Mg Tabs (Pramipexole dihydrochloride) .Marland Kitchen.. 1 by mouth three times a day 13)  Voltaren 1 % Gel (Diclofenac sodium) .... Apply tid 14)  Pyridium 200 Mg Tabs (Phenazopyridine hcl) .... One by mouth three times a day dysuria 15)  Plavix 75 Mg Tabs (Clopidogrel bisulfate) .Marland Kitchen.. 1 once daily to prevent clots 16)  Hydromet 5-1.5 Mg/74ml Syrp (Hydrocodone-homatropine) .Marland Kitchen.. 1 tsp q 4 hours as needed cough 17)  Tramadol Hcl 50 Mg Tabs (Tramadol hcl) .Marland Kitchen.. 1 qicd to prevent cough 18)  Hyzaar 100-25 Mg Tabs (Losartan potassium-hctz) .Marland Kitchen.. 1 once daily for blood pressure 19)  Furosemide 80 Mg Tabs (Furosemide) .Marland Kitchen.. 1 morning midaternoon for edema 20)  Ciprofloxacin Hcl 500 Mg Tabs (Ciprofloxacin hcl) .Marland Kitchen.. 1 two times a day for cystitis  Other Orders: Zoster (Shingles) Vaccine Live [phone]) Admin 1st Vaccine (75643)   Immunizations Administered:  Zostavax # 1:    Vaccine Type: Zostavax    Site: left deltoid    Mfr: Merck    Dose: 0.65    Route: [place]    Given by: Sid Falcon LPN    Exp. Date: 05/20/2011    Lot #: [id number]    VIS given: 07/14/05 given June 24, 2010.

## 2010-11-01 NOTE — Progress Notes (Signed)
Summary: NPO guidelines  Phone Note From Other Clinic Call back at [phone]   Caller: Bjorn Loser at Dr Barbara Cower [facility]'s office Call For: Scotty Court Summary of Call: Pt is having an oral surgery soon and she needs to be NPO from midnight until 9 am for procedure.  She will have light sedation.  Pt is on Glucotrol and Lantus.  What is Dr Laurita Quint policy for diabetics when it comes to NPO status. Initial call taken by: Sid Falcon LPN,  July 09, 2007 3:52 PM  Follow-up for Phone Call        take glucotrol the am before surgery day but not the am of surgery. Do not take any Lantus the night before. Follow-up by: Nelwyn Salisbury MD,  July 10, 2007 8:27 AM  Additional Follow-up for Phone Call Additional follow up Details #1::        spoke with Bjorn Loser at Dr Retta Mac office and above information given Additional Follow-up by: Pura Spice, RN,  July 10, 2007 11:00 AM

## 2010-11-01 NOTE — Assessment & Plan Note (Signed)
Summary: uti/dm   Vital Signs:  Patient profile:   77 year old female Weight:      229 pounds O2 Sat:      94 % Temp:     98.6 degrees F Pulse rate:   100 / minute Pulse rhythm:   regular BP sitting:   152 / 82  (left arm) Cuff size:   large  Vitals Entered By: Pura Spice, RN (March 30, 2010 11:19 AM) CC: UTI sx's refill ambien pyridium  c/o swelling cont in ankles   History of Present Illness: This 77 year old white female complains of severe swelling over the ankles and lower legs as well as urinary frequency dysuria and urgency She relates her to lock her arthritis is very bad at this time and would like additional treatment She related one of her sons fell from over 4 hours one week ago and had a fracture of his cervical spine as well as right lower leg but he is apparently doing satisfactory Patient needs refill of Ambien and Pyridium  Allergies: 1)  ! * Pencillin 2)  ! * Cleomycin 3)  ! Sulfa  Past History:  Past Medical History: Last updated: 12/23/2007 Depression Diabetes mellitus, type I Hypertension Coronary artery disease Current Problems:  ESOPHAGEAL REFLUX (ICD-530.81) DIARRHEA, CHRONIC (ICD-787.91) ONYCHOMYCOSIS (ICD-110.1) DEGENERATIVE JOINT DISEASE (ICD-715.90) CORONARY ARTERY DISEASE (ICD-414.00) OBESITY (ICD-278.00) SYNDROME, RESTLESS LEGS (ICD-333.94) HYPERTENSION (ICD-401.9) Colon Polyp Diverticulosis DIABETES MELLITUS, TYPE I (ICD-250.01) DEPRESSION (ICD-311)  Past Surgical History: Last updated: 08/03/2009 Cataract extraction Hysterectomy TonsillectomylL Low Back Surgery  X 2  cardiac cath oct 2010  Risk Factors: Smoking Status: never (03/01/2007)  Review of Systems      See HPI  The patient denies anorexia, fever, weight loss, weight gain, vision loss, decreased hearing, hoarseness, chest pain, syncope, dyspnea on exertion, peripheral edema, prolonged cough, headaches, hemoptysis, abdominal pain, melena, hematochezia, severe  indigestion/heartburn, hematuria, incontinence, genital sores, muscle weakness, suspicious skin lesions, transient blindness, difficulty walking, depression, unusual weight change, abnormal bleeding, enlarged lymph nodes, angioedema, breast masses, and testicular masses.    Physical Exam  General:  Well-developed,well-nourished,in no acute distress; alert,appropriate and cooperative throughout examination Lungs:  Normal respiratory effort, chest expands symmetrically. Lungs are clear to auscultation, no crackles or wheezes. Heart:  Normal rate and regular rhythm. S1 and S2 normal without gallop, murmur, click, rub or other extra sounds. Abdomen:  suprapubic tenderness no masses no tenderness over the descending colon Msk:  tenderness over the lumbar spine as well as both sacroiliac joints Extremities:  3+ pretibial edema as well as of the ankles and feet   Impression & Recommendations:  Problem # 1:  PERIPHERAL EDEMA (ICD-782.3) Assessment Deteriorated  The following medications were removed from the medication list:    Hydrochlorothiazide 50 Mg Tabs (Hydrochlorothiazide) .Marland Kitchen... 2 now then  2 in morning then 1 in midafternoon Her updated medication list for this problem includes:    Hyzaar 100-25 Mg Tabs (Losartan potassium-hctz) .Marland Kitchen... 1 once daily for blood pressure    Furosemide 80 Mg Tabs (Furosemide) .Marland Kitchen... 1 morning midaternoon for edema  Problem # 2:  URINARY TRACT INFECTION (ICD-599.0) Assessment: Deteriorated  Her updated medication list for this problem includes:    Vesicare 5 Mg Tabs (Solifenacin succinate) .Marland Kitchen... Take 1 tablet by mouth once a day    Pyridium 200 Mg Tabs (Phenazopyridine hcl) ..... One by mouth three times a day dysuria    Ciprofloxacin Hcl 500 Mg Tabs (Ciprofloxacin hcl) .Marland Kitchen... 1 two times a day  for cystitis  Orders: Prescription Created Electronically [phone])  Problem # 3:  BACK PAIN, CHRONIC (ICD-724.5) Assessment: Deteriorated  Her updated medication  list for this problem includes:    Hydrocodone-acetaminophen 10-650 Mg Tabs (Hydrocodone-acetaminophen) .Marland Kitchen... Take 1 tablet by mouth every four to six hours no early refill    Flexeril 10 Mg Tabs (Cyclobenzaprine hcl) .Marland Kitchen... 1 morn mid afternoon and hs for muscle spasm    Tramadol Hcl 50 Mg Tabs (Tramadol hcl) .Marland Kitchen... 1 qicd to prevent cough Depo-Medrol 160 mg IM  Complete Medication List: 1)  Glucotrol Xl 10 Mg Tb24 (Glipizide) .... Take 2 tablets once daily 2)  Hydrocodone-acetaminophen 10-650 Mg Tabs (Hydrocodone-acetaminophen) .... Take 1 tablet by mouth every four to six hours no early refill 3)  Lantus Solostar 100 Unit/ml Soln (Insulin glargine) .... Inject 80 units sd daily 4)  Promethazine Hcl 25 Mg Tabs (Promethazine hcl) .... Take 1 tablet by mouth every  4 hrs as needed nausea 5)  Vesicare 5 Mg Tabs (Solifenacin succinate) .... Take 1 tablet by mouth once a day 6)  Ambien 10 Mg Tabs (Zolpidem tartrate) .Marland Kitchen.. 1 hs for sleep 7)  Flexeril 10 Mg Tabs (Cyclobenzaprine hcl) .Marland Kitchen.. 1 morn mid afternoon and hs for muscle spasm 8)  Dextroamphetamine Sulfate Cr 10 Mg Xr24h-cap (Dextroamphetamine sulfate) .... 2 each am for narcolepsy, 9)  Fluconazole 150 Mg Tabs (Fluconazole) .... As needed 10)  Lomotil 2.5-0.025 Mg Tabs (Diphenoxylate-atropine) .Marland Kitchen.. 1-2 qid as needed diarrhea 11)  Magicmouthwash Hc  .Marland Kitchen.. 1 tsp in mouth, rinse gargle and swallow 12)  Pramipexole Dihydrochloride 1 Mg Tabs (Pramipexole dihydrochloride) .Marland Kitchen.. 1 by mouth three times a day 13)  Voltaren 1 % Gel (Diclofenac sodium) .... Apply tid 14)  Pyridium 200 Mg Tabs (Phenazopyridine hcl) .... One by mouth three times a day dysuria 15)  Plavix 75 Mg Tabs (Clopidogrel bisulfate) .Marland Kitchen.. 1 once daily to prevent clots 16)  Hydromet 5-1.5 Mg/21ml Syrp (Hydrocodone-homatropine) .Marland Kitchen.. 1 tsp q 4 hours as needed cough 17)  Tramadol Hcl 50 Mg Tabs (Tramadol hcl) .Marland Kitchen.. 1 qicd to prevent cough 18)  Hyzaar 100-25 Mg Tabs (Losartan potassium-hctz)  .Marland Kitchen.. 1 once daily for blood pressure 19)  Furosemide 80 Mg Tabs (Furosemide) .Marland Kitchen.. 1 morning midaternoon for edema 20)  Ciprofloxacin Hcl 500 Mg Tabs (Ciprofloxacin hcl) .Marland Kitchen.. 1 two times a day for cystitis  Other Orders: Depo- Medrol 80mg  (J1040) Depo- Medrol 40mg  (J1030) Admin of Therapeutic Inj  intramuscular or subcutaneous (60454)  Patient Instructions: 1)  acute cystitis, cipro 500 mg two times a day 2)  Take furosemide 80 mg an and midafternoon for edema 3)  Depomedrol 160 mg for arthritis 4)  refilled medications 5)  return 2 weeks for urinalysis Prescriptions: AMBIEN 10 MG  TABS (ZOLPIDEM TARTRATE) 1 hs for sleep  #30 x 5   Entered and Authorized by:   Judithann Sheen MD   Signed by:   Judithann Sheen MD on 03/30/2010   Method used:   Print then Give to Patient   RxID:   [id number] CIPROFLOXACIN HCL 500 MG TABS (CIPROFLOXACIN HCL) 1 two times a day for cystitis  #30 x 1   Entered and Authorized by:   Judithann Sheen MD   Signed by:   Judithann Sheen MD on 03/30/2010   Method used:   Electronically to        [facility] [address] [phone]* (retail)       [address]  [address]       [place], [place]  [address]       Ph: [phone]       Fax: [phone]   RxID:   [id number] PYRIDIUM 200 MG TABS (PHENAZOPYRIDINE HCL) one by mouth three times a day dysuria  #60 x 11   Entered and Authorized by:   Judithann Sheen MD   Signed by:   Judithann Sheen MD on 03/30/2010   Method used:   Electronically to        [facility] [address] [phone]* (retail)       [address]       [address]       [place], [place]  [address]       Ph: [phone]       Fax: [phone]   RxID:   [id number] FUROSEMIDE 80 MG TABS (FUROSEMIDE) 1 morning midaternoon for edema  #60 x 111   Entered and Authorized by:   Judithann Sheen MD   Signed by:   Judithann Sheen MD on 03/30/2010   Method used:   Electronically to         [facility] [address] [phone]* (retail)       [address]       [address]       [place], [place]  [address]       Ph: [phone]       Fax: [phone]   RxID:   [id number]   Laboratory Results   Urine Tests    Routine Urinalysis   Color: orange Appearance: Clear Glucose: 1+   (Normal Range: Negative) Bilirubin: 1+   (Normal Range: Negative) Ketone: 1+   (Normal Range: Negative) Spec. Gravity: <1.005   (Normal Range: 1.003-1.035) Blood: negative   (Normal Range: Negative) pH: 5.0   (Normal Range: 5.0-8.0) Protein: 2+   (Normal Range: Negative) Urobilinogen: 4.0   (Normal Range: 0-1) Nitrite: positive   (Normal Range: Negative) Leukocyte Esterace: 3+   (Normal Range: Negative)         Medication Administration  Injection # 1:    Medication: Depo- Medrol 80mg     Diagnosis: ARTHRITIS (ICD-716.90)    Route: IM    Site: RUOQ gluteus    Exp Date: 12/2012    Lot #: [id number]    Mfr: [employer]    Patient tolerated injection without complications    Given by: Pura Spice, RN (March 30, 2010 1:17 PM)  Injection # 2:    Medication: Depo- Medrol 40mg     Diagnosis: ARTHRITIS (ICD-716.90)    Route: IM    Site: RUOQ gluteus    Exp Date: 12/2012    Lot #: [id number]    Mfr: [employer]    Patient tolerated injection without complications    Given by: Pura Spice, RN (March 30, 2010 1:18 PM)  Orders Added: 1)  Depo- Medrol 80mg  [J1040] 2)  Depo- Medrol 40mg  [J1030] 3)  Admin of Therapeutic Inj  intramuscular or subcutaneous [96372] 4)  Prescription Created Electronically [G8553] 5)  Est. Patient Level IV [88502]

## 2010-11-01 NOTE — Progress Notes (Signed)
Summary: stopped meds  Phone Note Call from Patient   Caller: Patient Call For: Judithann Sheen MD Reason for Call: Lab or Test Results Action Taken: Provider Notified Details for Reason: Stopped Azor Details of Complaint: Bilateral leg edema Summary of Call: Pt stopped Azor due to swollen legs.......Marland KitchenWants a different BP meds. [phone] [facility]) Initial call taken by: Lynann Beaver CMA,  March 08, 2010 10:36 AM  Follow-up for Phone Call        sent new medication Follow-up by: Judithann Sheen MD,  March 08, 2010 6:03 PM

## 2010-11-01 NOTE — Medication Information (Signed)
Summary: Coverage Approval for Voltaren Gel/[employer]  Coverage Approval for Voltaren Gel/[employer]   Imported By: Maryln Gottron 10/13/2009 12:33:23  _____________________________________________________________________  External Attachment:    Type:   Image     Comment:   External Document

## 2010-11-01 NOTE — Miscellaneous (Signed)
Summary: Orders Update  Clinical Lists Changes  Orders: Added new Test order of Venous Duplex Lower Extremity (Venous Duplex Lower) - Signed 

## 2010-11-01 NOTE — Miscellaneous (Signed)
Summary: Waiver of Liability for Zostavax  Waiver of Liability for Zostavax   Imported By: Maryln Gottron 06/28/2010 15:42:04  _____________________________________________________________________  External Attachment:    Type:   Image     Comment:   External Document

## 2010-11-01 NOTE — Progress Notes (Signed)
Summary: meds  Phone Note Call from Patient   Caller: Patient Call For: Judithann Sheen MD Summary of Call: Needs written prescription for narcolepsy, and call when ready, please.  [phone] Initial call taken by: Lynann Beaver CMA,  May 24, 2010 9:32 AM  Follow-up for Phone Call        ok pt notifed and can pick up today.  Follow-up by: Pura Spice, RN,  May 24, 2010 2:00 PM    Prescriptions: DEXTROAMPHETAMINE SULFATE CR 10 MG XR24H-CAP (DEXTROAMPHETAMINE SULFATE) 2 each AM for narcolepsy,  #60 x 0   Entered by:   Pura Spice, RN   Authorized by:   Judithann Sheen MD   Signed by:   Pura Spice, RN on 05/24/2010   Method used:   Print then Give to Patient   RxID:   [id number]

## 2010-11-01 NOTE — Assessment & Plan Note (Signed)
Summary: follow up on bronchitus/cjr/pt rsc/cjr/pt rescd//ccm   Vital Signs:  Patient profile:   77 year old female Weight:      227 pounds O2 Sat:      95 % Temp:     98.4 degrees F Pulse rate:   92 / minute BP sitting:   142 / 80  (left arm)  Vitals Entered By: Pura Spice, RN (Feb 10, 2010 2:37 PM) CC: reck chest  refill meds    History of Present Illness: This 77 year old white female with his in for followup examination regarding previously treated bronchitis. In general she is doing better but continues to have an incessant cough which is not totally relieved with Hydromet, and has a nonproductive cough, no postnasal drainage Desires refill of numerous medication Other problems on a rather good control including restless leg syndrome controlled with Mirapex Hypertension improved Regarding her peripheral vascular problem he continues to have some pain but in general but her CBGs 110 to 1:30 Continues to need medication for chronic back pain, degenerative disc disease  Allergies: 1)  ! * Pencillin 2)  ! * Cleomycin 3)  ! Sulfa  Past History:  Past Medical History: Last updated: 12/23/2007 Depression Diabetes mellitus, type I Hypertension Coronary artery disease Current Problems:  ESOPHAGEAL REFLUX (ICD-530.81) DIARRHEA, CHRONIC (ICD-787.91) ONYCHOMYCOSIS (ICD-110.1) DEGENERATIVE JOINT DISEASE (ICD-715.90) CORONARY ARTERY DISEASE (ICD-414.00) OBESITY (ICD-278.00) SYNDROME, RESTLESS LEGS (ICD-333.94) HYPERTENSION (ICD-401.9) Colon Polyp Diverticulosis DIABETES MELLITUS, TYPE I (ICD-250.01) DEPRESSION (ICD-311)  Past Surgical History: Last updated: 08/03/2009 Cataract extraction Hysterectomy TonsillectomylL Low Back Surgery  X 2  cardiac cath oct 2010  Risk Factors: Smoking Status: never (03/01/2007)  Review of Systems  The patient denies anorexia, fever, weight loss, weight gain, vision loss, decreased hearing, hoarseness, chest pain, syncope,  dyspnea on exertion, peripheral edema, prolonged cough, headaches, hemoptysis, abdominal pain, melena, hematochezia, severe indigestion/heartburn, hematuria, incontinence, genital sores, muscle weakness, suspicious skin lesions, transient blindness, difficulty walking, depression, unusual weight change, abnormal bleeding, enlarged lymph nodes, angioedema, breast masses, and testicular masses.    Physical Exam  General:  Well-developed,well-nourished,in no acute distress; alert,appropriate and cooperative throughout examination Head:  Normocephalic and atraumatic without obvious abnormalities. No apparent alopecia or balding. Eyes:  No corneal or conjunctival inflammation noted. EOMI. Perrla. Funduscopic exam benign, without hemorrhages, exudates or papilledema. Vision grossly normal. Ears:  External ear exam shows no significant lesions or deformities.  Otoscopic examination reveals clear canals, tympanic membranes are intact bilaterally without bulging, retraction, inflammation or discharge. Hearing is grossly normal bilaterally. Nose:  External nasal examination shows no deformity or inflammation. Nasal mucosa are pink and moist without lesions or exudates. Mouth:  Oral mucosa and oropharynx without lesions or exudates.  Teeth in good repair. Neck:  No deformities, masses, or tenderness noted. Lungs:  essentially normal minimal rhonchi no rales no dullness Heart:  Normal rate and regular rhythm. S1 and S2 normal without gallop, murmur, click, rub or other extra sounds.   Impression & Recommendations:  Problem # 1:  COUGH (ICD-786.2) Assessment Unchanged  tramadol 50 mg q.i.d. for cough, use Hydromet and there  Orders: Prescription Created Electronically [phone])  Problem # 2:  ACUTE BRONCHITIS (ICD-466.0) Assessment: Improved  Her updated medication list for this problem includes:    Hydromet 5-1.5 Mg/64ml Syrp (Hydrocodone-homatropine) .Marland Kitchen... 1 tsp q 4 hours as needed cough  Problem #  3:  IRRITABLE BOWEL SYNDROME (ICD-564.1) Assessment: Improved  Problem # 4:  PERIPHERAL VASCULAR DISEASE (ICD-443.9) Assessment: Improved  Her updated medication  list for this problem includes:    Plavix 75 Mg Tabs (Clopidogrel bisulfate) .Marland Kitchen... 1 once daily to prevent clots  Problem # 5:  DIABETES MELLITUS, TYPE II (ICD-250.00) Assessment: Improved  Her updated medication list for this problem includes:    Glucotrol Xl 10 Mg Tb24 (Glipizide) .Marland Kitchen... Take 2 tablets once daily    Lantus Solostar 100 Unit/ml Soln (Insulin glargine) ..... Inject 80 units sd daily    Azor 10-20 Mg Tabs (Amlodipine-olmesartan) .Marland Kitchen... 1 once daily for hypertensipon  Problem # 6:  NARCOLEPSY WITHOUT CATAPLEXY (ICD-347.00) Assessment: Unchanged  Problem # 7:  EDEMA (ICD-782.3) Assessment: Improved  The following medications were removed from the medication list:    Furosemide 40 Mg Tabs (Furosemide) .Marland Kitchen... 2 am 2 midafternoon Her updated medication list for this problem includes:    Hydrochlorothiazide 50 Mg Tabs (Hydrochlorothiazide) .Marland Kitchen... 2 now then  2 in morning then 1 in midafternoon  Problem # 8:  ARTHRITIS (ICD-716.90) Assessment: Improved  Complete Medication List: 1)  Glucotrol Xl 10 Mg Tb24 (Glipizide) .... Take 2 tablets once daily 2)  Hydrocodone-acetaminophen 10-650 Mg Tabs (Hydrocodone-acetaminophen) .... Take 1 tablet by mouth every four to six hours no early refill 3)  Lantus Solostar 100 Unit/ml Soln (Insulin glargine) .... Inject 80 units sd daily 4)  Promethazine Hcl 25 Mg Tabs (Promethazine hcl) .... Take 1 tablet by mouth every  4 hrs as needed nausea 5)  Vesicare 5 Mg Tabs (Solifenacin succinate) .... Take 1 tablet by mouth once a day 6)  Ambien 10 Mg Tabs (Zolpidem tartrate) .Marland Kitchen.. 1 hs for sleep 7)  Flexeril 10 Mg Tabs (Cyclobenzaprine hcl) .Marland Kitchen.. 1 morn mid afternoon and hs for muscle spasm 8)  Dextroamphetamine Sulfate Cr 10 Mg Xr24h-cap (Dextroamphetamine sulfate) .... 2 each am for  narcolepsy, 9)  Fluconazole 150 Mg Tabs (Fluconazole) .... As needed 10)  Lomotil 2.5-0.025 Mg Tabs (Diphenoxylate-atropine) .Marland Kitchen.. 1-2 qid as needed diarrhea 11)  Magicmouthwash Hc  .Marland Kitchen.. 1 tsp in mouth, rinse gargle and swallow 12)  Pramipexole Dihydrochloride 1 Mg Tabs (Pramipexole dihydrochloride) .Marland Kitchen.. 1 by mouth three times a day 13)  Voltaren 1 % Gel (Diclofenac sodium) .... Apply tid 14)  Pyridium 200 Mg Tabs (Phenazopyridine hcl) .... One by mouth three times a day dysuria 15)  Azor 10-20 Mg Tabs (Amlodipine-olmesartan) .Marland Kitchen.. 1 once daily for hypertensipon 16)  Plavix 75 Mg Tabs (Clopidogrel bisulfate) .Marland Kitchen.. 1 once daily to prevent clots 17)  Hydrochlorothiazide 50 Mg Tabs (Hydrochlorothiazide) .... 2 now then  2 in morning then 1 in midafternoon 18)  Hydromet 5-1.5 Mg/31ml Syrp (Hydrocodone-homatropine) .Marland Kitchen.. 1 tsp q 4 hours as needed cough 19)  Tramadol Hcl 50 Mg Tabs (Tramadol hcl) .Marland Kitchen.. 1 qicd to prevent cough  Patient Instructions: 1)  Bronchitis is improved however he continued a call from dictation of the trachea and bronchi 2)  Continue to use Hydromet 3)  Take tramadol 50 mg 4 times daily to prevent cough 4)  Refill medications 5)  Continue taking your regular medicines Prescriptions: GLUCOTROL XL 10 MG TB24 (GLIPIZIDE) take 2 tablets once daily  #60 x 11   Entered by:   Pura Spice, RN   Authorized by:   Judithann Sheen MD   Signed by:   Pura Spice, RN on 02/15/2010   Method used:   Electronically to        [facility] [address] [phone]* (retail)       [address]  [address]       [place], [place]  [address]       Ph: [phone]       Fax: [phone]   RxID:   [id number] PROMETHAZINE HCL 25 MG TABS (PROMETHAZINE HCL) Take 1 tablet by mouth every  4 hrs as needed nausea  #100 x 5   Entered and Authorized by:   Judithann Sheen MD   Signed by:   Judithann Sheen MD on 02/10/2010   Method used:   Electronically to        [facility] [address] [phone]* (retail)       [address]       [address]       [place], [place]  [address]       Ph: [phone]       Fax: [phone]   RxID:   [id number] MAGICMOUTHWASH HC 1 tsp in mouth, rinse gargle and swallow  #360 cc x 5   Entered and Authorized by:   Judithann Sheen MD   Signed by:   Judithann Sheen MD on 02/10/2010   Method used:   Print then Give to Patient   RxID:   [id number] LOMOTIL 2.5-0.025 MG TABS (DIPHENOXYLATE-ATROPINE) 1-2 qid as needed diarrhea  #50 x 5   Entered and Authorized by:   Judithann Sheen MD   Signed by:   Judithann Sheen MD on 02/10/2010   Method used:   Print then Give to Patient   RxID:   [id number] HYDROCODONE-ACETAMINOPHEN 10-650 MG TABS (HYDROCODONE-ACETAMINOPHEN) Take 1 tablet by mouth every four to six hours no early refill  #129 x 5   Entered and Authorized by:   Judithann Sheen MD   Signed by:   Judithann Sheen MD on 02/10/2010   Method used:   Print then Give to Patient   RxID:   [id number] TRAMADOL HCL 50 MG TABS (TRAMADOL HCL) 1 qicd to prevent cough  #60 x 5   Entered and Authorized by:   Judithann Sheen MD   Signed by:   Judithann Sheen MD on 02/10/2010   Method used:   Electronically to        [facility] [address] [phone]* (retail)       [address]       [address]       [place], [place]  [address]       Ph: [phone]       Fax: [phone]   RxID:   [id number]

## 2010-11-01 NOTE — Medication Information (Signed)
Summary: Order for Diabetic Testing Supplies  Order for Diabetic Testing Supplies   Imported By: Maryln Gottron 06/28/2010 13:33:37  _____________________________________________________________________  External Attachment:    Type:   Image     Comment:   External Document

## 2010-11-01 NOTE — Assessment & Plan Note (Signed)
Summary: cough/sore throat/cjr   Vital Signs:  Patient profile:   77 year old female O2 Sat:      95 % on Room air Temp:     98.3 degrees F oral Pulse rate:   88 / minute Pulse rhythm:   regular BP sitting:   128 / 70  (left arm) Cuff size:   regular  Vitals Entered By: Raechel Ache, RN (January 28, 2010 11:16 AM)  O2 Flow:  Room air CC: C/o sore throat and cough since yesterday- feels bad.   History of Present Illness: Here with 2 days of fever, chest congestion, dry cough, SOB, and fatigue. No NVD. On fluids.   Allergies: 1)  ! * Pencillin 2)  ! * Cleomycin 3)  ! Sulfa  Past History:  Past Medical History: Reviewed history from 12/23/2007 and no changes required. Depression Diabetes mellitus, type I Hypertension Coronary artery disease Current Problems:  ESOPHAGEAL REFLUX (ICD-530.81) DIARRHEA, CHRONIC (ICD-787.91) ONYCHOMYCOSIS (ICD-110.1) DEGENERATIVE JOINT DISEASE (ICD-715.90) CORONARY ARTERY DISEASE (ICD-414.00) OBESITY (ICD-278.00) SYNDROME, RESTLESS LEGS (ICD-333.94) HYPERTENSION (ICD-401.9) Colon Polyp Diverticulosis DIABETES MELLITUS, TYPE I (ICD-250.01) DEPRESSION (ICD-311)  Review of Systems  The patient denies anorexia, weight loss, weight gain, vision loss, decreased hearing, hoarseness, chest pain, syncope, dyspnea on exertion, peripheral edema, headaches, hemoptysis, abdominal pain, melena, hematochezia, severe indigestion/heartburn, hematuria, incontinence, genital sores, muscle weakness, suspicious skin lesions, transient blindness, difficulty walking, depression, unusual weight change, abnormal bleeding, enlarged lymph nodes, angioedema, breast masses, and testicular masses.    Physical Exam  General:  alert but appears ill, gets on the table easily Head:  Normocephalic and atraumatic without obvious abnormalities. No apparent alopecia or balding. Eyes:  No corneal or conjunctival inflammation noted. EOMI. Perrla. Funduscopic exam benign,  without hemorrhages, exudates or papilledema. Vision grossly normal. Ears:  External ear exam shows no significant lesions or deformities.  Otoscopic examination reveals clear canals, tympanic membranes are intact bilaterally without bulging, retraction, inflammation or discharge. Hearing is grossly normal bilaterally. Nose:  External nasal examination shows no deformity or inflammation. Nasal mucosa are pink and moist without lesions or exudates. Mouth:  Oral mucosa and oropharynx without lesions or exudates.  Teeth in good repair. Neck:  No deformities, masses, or tenderness noted. Lungs:  scattered rhonchi, no rales Heart:  Normal rate and regular rhythm. S1 and S2 normal without gallop, murmur, click, rub or other extra sounds.   Impression & Recommendations:  Problem # 1:  ACUTE BRONCHITIS (ICD-466.0)  The following medications were removed from the medication list:    Ciprofloxacin Hcl 500 Mg Tabs (Ciprofloxacin hcl) .Marland Kitchen... 1 two times a day for urinary tact infection Her updated medication list for this problem includes:    Cefdinir 300 Mg Caps (Cefdinir) .Marland Kitchen..Marland Kitchen Two times a day    Hydromet 5-1.5 Mg/72ml Syrp (Hydrocodone-homatropine) .Marland Kitchen... 1 tsp q 4 hours as needed cough  Orders: Depo- Medrol 80mg  (J1040) Admin of Therapeutic Inj  intramuscular or subcutaneous (29562)  Complete Medication List: 1)  Glucotrol Xl 10 Mg Tb24 (Glipizide) .... Take 2 tablets once daily 2)  Hydrocodone-acetaminophen 10-650 Mg Tabs (Hydrocodone-acetaminophen) .... Take 1 tablet by mouth every four to six hours no early refill 3)  Lantus Solostar 100 Unit/ml Soln (Insulin glargine) .... Inject 80 units sd daily 4)  Promethazine Hcl 25 Mg Tabs (Promethazine hcl) .... Take 1 tablet by mouth every  4 hrs as needed nausea 5)  Vesicare 5 Mg Tabs (Solifenacin succinate) .... Take 1 tablet by mouth once a day 6)  Ambien 10 Mg Tabs (Zolpidem tartrate) .Marland Kitchen.. 1 hs for sleep 7)  Protonix 40 Mg Tbec (Pantoprazole  sodium) .... Once daily 8)  Flexeril 10 Mg Tabs (Cyclobenzaprine hcl) .Marland Kitchen.. 1 morn mid afternoon and hs for muscle spasm 9)  Dextroamphetamine Sulfate Cr 10 Mg Xr24h-cap (Dextroamphetamine sulfate) .... 2 each am for narcolepsy, 10)  Metrogel-vaginal 0.75 % Gel (Metronidazole) .... Insert 1 applicator hs, repear if needed vaginitis 11)  Zoloft 100 Mg Tabs (Sertraline hcl) .Marland Kitchen.. 1 once daily for depression 12)  Fluconazole 150 Mg Tabs (Fluconazole) .... As needed 13)  Furosemide 40 Mg Tabs (Furosemide) .... 2 am 2 midafternoon 14)  Lomotil 2.5-0.025 Mg Tabs (Diphenoxylate-atropine) 15)  Magicmouthwash Hc  .Marland Kitchen.. 1 tsp in mouth, rinse gargle and swallow 16)  Pramipexole Dihydrochloride 1 Mg Tabs (Pramipexole dihydrochloride) .Marland Kitchen.. 1 by mouth three times a day 17)  Lamisil 250 Mg Tabs (Terbinafine hcl) .Marland Kitchen.. 1 by mouth once daily 18)  Voltaren 1 % Gel (Diclofenac sodium) .... Apply tid 19)  Pyridium 200 Mg Tabs (Phenazopyridine hcl) .... One by mouth three times a day dysuria 20)  Azor 10-20 Mg Tabs (Amlodipine-olmesartan) .Marland Kitchen.. 1 once daily for hypertensipon 21)  Plavix 75 Mg Tabs (Clopidogrel bisulfate) .Marland Kitchen.. 1 once daily to prevent clots 22)  Hydrochlorothiazide 50 Mg Tabs (Hydrochlorothiazide) .... 2 now then  2 in morning then 1 in midafternoon 23)  Cefdinir 300 Mg Caps (Cefdinir) .... Two times a day 24)  Hydromet 5-1.5 Mg/47ml Syrp (Hydrocodone-homatropine) .Marland Kitchen.. 1 tsp q 4 hours as needed cough  Patient Instructions: 1)  Please schedule a follow-up appointment as needed .  Prescriptions: HYDROMET 5-1.5 MG/5ML SYRP (HYDROCODONE-HOMATROPINE) 1 tsp q 4 hours as needed cough  #240 x 0   Entered and Authorized by:   Nelwyn Salisbury MD   Signed by:   Nelwyn Salisbury MD on 01/28/2010   Method used:   Print then Give to Patient   RxID:   [id number] CEFDINIR 300 MG CAPS (CEFDINIR) two times a day  #20 x 0   Entered and Authorized by:   Nelwyn Salisbury MD   Signed by:   Nelwyn Salisbury MD on 01/28/2010    Method used:   Print then Give to Patient   RxID:   [id number] FLUCONAZOLE 150 MG TABS (FLUCONAZOLE) as needed  #1 x 5   Entered and Authorized by:   Nelwyn Salisbury MD   Signed by:   Nelwyn Salisbury MD on 01/28/2010   Method used:   Print then Give to Patient   RxID:   [id number]    Medication Administration  Injection # 1:    Medication: Depo- Medrol 80mg     Diagnosis: ACUTE BRONCHITIS (ICD-466.0)    Route: IM    Site: L deltoid    Exp Date: 08/2012    Lot #: [id number]    Mfr: [employer]    Comments: 120 mg given    Patient tolerated injection without complications    Given by: Raechel Ache, RN (January 28, 2010 12:03 PM)  Orders Added: 1)  Est. Patient Level IV [36644] 2)  Depo- Medrol 80mg  [J1040] 3)  Admin of Therapeutic Inj  intramuscular or subcutaneous [03474]

## 2010-11-01 NOTE — Assessment & Plan Note (Signed)
Summary: LIGHT-HEADED, UNSTEADY GAIT // RS   Vital Signs:  Patient profile:   77 year old female Weight:      230 pounds Temp:     98.2 degrees F oral BP sitting:   136 / 64  Vitals Entered By: Lynann Beaver CMA (November 03, 2009 4:59 PM) CC: dizziness/bilateral leg edema Is Patient Diabetic? Yes Pain Assessment Patient in pain? no        History of Present Illness: this 77 year old white female known diabetic has had onset of dizziness on movement for the past 3-4 days with some nausea. She did not fall but has had an unsteady gait which has occurred in the past Blood pressure has been normal was 136/64 today She relates her CBGs have been good 120-140, no hypoglycemia No urinary symptoms no nausea vomiting or diarrhea but did correct she has had some nausea with dizziness  Current Medications (verified): 1)  Glucotrol Xl 10 Mg Tb24 (Glipizide) .... Take 2 Tablets Once Daily 2)  Hydrocodone-Acetaminophen 10-650 Mg Tabs (Hydrocodone-Acetaminophen) .... Take 1 Tablet By Mouth Every Four To Six Hours No Early Refill 3)  Lantus Solostar 100 Unit/ml Soln (Insulin Glargine) .... Inject 80 Units Sd Daily 4)  Promethazine Hcl 25 Mg Tabs (Promethazine Hcl) .... Take 1 Tablet By Mouth Every  4 Hrs As Needed Nausea 5)  Vesicare 5 Mg Tabs (Solifenacin Succinate) .... Take 1 Tablet By Mouth Once A Day 6)  Ambien 10 Mg  Tabs (Zolpidem Tartrate) .Marland Kitchen.. 1 Hs For Sleep 7)  Protonix 40 Mg Tbec (Pantoprazole Sodium) .... Once Daily 8)  Flexeril 10 Mg Tabs (Cyclobenzaprine Hcl) .Marland Kitchen.. 1 Morn Mid Afternoon and Hs For Muscle Spasm 9)  Dextroamphetamine Sulfate Cr 10 Mg Xr24h-Cap (Dextroamphetamine Sulfate) .... 2 Each Am For Narcolepsy, 10)  Metrogel-Vaginal 0.75 % Gel (Metronidazole) .... Insert 1 Applicator Hs, Repear If Needed Vaginitis 11)  Zoloft 100 Mg Tabs (Sertraline Hcl) .Marland Kitchen.. 1 Once Daily For Depression 12)  Fluconazole 150 Mg Tabs (Fluconazole) .Marland Kitchen.. 1  Now Then 1 in 1 Week For Yeat 13)   Furosemide 40 Mg Tabs (Furosemide) .... 2 Am 2 Midafternoon 14)  Lomotil 2.5-0.025 Mg Tabs (Diphenoxylate-Atropine) 15)  Magicmouthwash Hc .Marland Kitchen.. 1 Tsp in Mouth, Rinse Gargle and Swallow 16)  Pramipexole Dihydrochloride 1 Mg Tabs (Pramipexole Dihydrochloride) .Marland Kitchen.. 1 By Mouth Three Times A Day 17)  Lamisil 250 Mg Tabs (Terbinafine Hcl) .Marland Kitchen.. 1 By Mouth Once Daily 18)  Ciprofloxacin Hcl 500 Mg Tabs (Ciprofloxacin Hcl) .Marland Kitchen.. 1 Two Times A Day For Urinary Tact Infection 19)  Voltaren 1 % Gel (Diclofenac Sodium) .... Apply Tid 20)  Pyridium 200 Mg Tabs (Phenazopyridine Hcl) .... One By Mouth Three Times A Day Dysuria 21)  Azor 10-20 Mg Tabs (Amlodipine-Olmesartan) .Marland Kitchen.. 1 Once Daily For Hypertensipon 22)  Plavix 75 Mg Tabs (Clopidogrel Bisulfate) .Marland Kitchen.. 1 Once Daily To Prevent Clots  Allergies (verified): 1)  ! * Pencillin 2)  ! * Cleomycin 3)  ! Sulfa  Review of Systems  The patient denies anorexia, fever, weight loss, weight gain, vision loss, decreased hearing, hoarseness, chest pain, syncope, dyspnea on exertion, peripheral edema, prolonged cough, headaches, hemoptysis, abdominal pain, melena, hematochezia, severe indigestion/heartburn, hematuria, incontinence, genital sores, muscle weakness, suspicious skin lesions, transient blindness, difficulty walking, depression, unusual weight change, abnormal bleeding, enlarged lymph nodes, angioedema, breast masses, and testicular masses.    Physical Exam  General:  Well-developed,well-nourished,in no acute distress; alert,appropriate and cooperative throughout examination Head:  Normocephalic and atraumatic without obvious abnormalities.  No apparent alopecia or balding. Eyes:  this tightness to the right Ears:  External ear exam shows no significant lesions or deformities.  Otoscopic examination reveals clear canals, tympanic membranes are intact bilaterally without bulging, retraction, inflammation or discharge. Hearing is grossly normal  bilaterally. Nose:  on her nasal congestion with clear drainage Mouth:  Oral mucosa and oropharynx without lesions or exudates.  Teeth in good repair. Neck:  No deformities, masses, or tenderness noted. Lungs:  Normal respiratory effort, chest expands symmetrically. Lungs are clear to auscultation, no crackles or wheezes. Heart:  Normal rate and regular rhythm. S1 and S2 normal without gallop, murmur, click, rub or other extra sounds. Abdomen:  Bowel sounds positive,abdomen soft and non-tender without masses, organomegaly or hernias noted. Extremities:  left pretibial edema and right pretibial edema.     Impression & Recommendations:  Problem # 3:  EDEMA (ICD-782.3) Assessment: Deteriorated  Her updated medication list for this problem includes:    Furosemide 40 Mg Tabs (Furosemide) .Marland Kitchen... 2 am 2 midafternoon  Problem # 4:  DIABETES MELLITUS, TYPE II (ICD-250.00) Assessment: Unchanged  Her updated medication list for this problem includes:    Glucotrol Xl 10 Mg Tb24 (Glipizide) .Marland Kitchen... Take 2 tablets once daily    Lantus Solostar 100 Unit/ml Soln (Insulin glargine) ..... Inject 80 units sd daily    Azor 10-20 Mg Tabs (Amlodipine-olmesartan) .Marland Kitchen... 1 once daily for hypertensipon  Problem # 5:  LABYRINTHITIS (ICD-386.30) Assessment: New  meclizine 25 mg t.i.d.  Orders: Prescription Created Electronically [id number])  Problem # 6:  PERIPHERAL VASCULAR DISEASE (ICD-443.9) Assessment: Unchanged  Her updated medication list for this problem includes:    Plavix 75 Mg Tabs (Clopidogrel bisulfate) .Marland Kitchen... 1 once daily to prevent clots  Problem # 7:  BACK PAIN, CHRONIC (ICD-724.5) Assessment: Unchanged  Her updated medication list for this problem includes:    Hydrocodone-acetaminophen 10-650 Mg Tabs (Hydrocodone-acetaminophen) .Marland Kitchen... Take 1 tablet by mouth every four to six hours no early refill    Flexeril 10 Mg Tabs (Cyclobenzaprine hcl) .Marland Kitchen... 1 morn mid afternoon and hs for muscle  spasm  Problem # 8:  NARCOLEPSY WITHOUT CATAPLEXY (ICD-347.00) Assessment: Unchanged  Problem # 9:  DEGENERATIVE JOINT DISEASE (ICD-715.90) Assessment: Unchanged  Her updated medication list for this problem includes:    Hydrocodone-acetaminophen 10-650 Mg Tabs (Hydrocodone-acetaminophen) .Marland Kitchen... Take 1 tablet by mouth every four to six hours no early refill  Problem # 10:  ARTHRITIS (ICD-716.90) Assessment: Unchanged  Problem # 11:  HYPERTENSION (ICD-401.9) Assessment: Improved  Her updated medication list for this problem includes:    Furosemide 40 Mg Tabs (Furosemide) .Marland Kitchen... 2 am 2 midafternoon    Azor 10-20 Mg Tabs (Amlodipine-olmesartan) .Marland Kitchen... 1 once daily for hypertensipon  Problem # 12:  CORONARY ARTERY DISEASE (ICD-414.00) Assessment: Improved  Her updated medication list for this problem includes:    Furosemide 40 Mg Tabs (Furosemide) .Marland Kitchen... 2 am 2 midafternoon    Azor 10-20 Mg Tabs (Amlodipine-olmesartan) .Marland Kitchen... 1 once daily for hypertensipon    Plavix 75 Mg Tabs (Clopidogrel bisulfate) .Marland Kitchen... 1 once daily to prevent clots  Complete Medication List: 1)  Glucotrol Xl 10 Mg Tb24 (Glipizide) .... Take 2 tablets once daily 2)  Hydrocodone-acetaminophen 10-650 Mg Tabs (Hydrocodone-acetaminophen) .... Take 1 tablet by mouth every four to six hours no early refill 3)  Lantus Solostar 100 Unit/ml Soln (Insulin glargine) .... Inject 80 units sd daily 4)  Promethazine Hcl 25 Mg Tabs (Promethazine hcl) .... Take 1 tablet by mouth every  4 hrs as needed nausea 5)  Vesicare 5 Mg Tabs (Solifenacin succinate) .... Take 1 tablet by mouth once a day 6)  Ambien 10 Mg Tabs (Zolpidem tartrate) .Marland Kitchen.. 1 hs for sleep 7)  Protonix 40 Mg Tbec (Pantoprazole sodium) .... Once daily 8)  Flexeril 10 Mg Tabs (Cyclobenzaprine hcl) .Marland Kitchen.. 1 morn mid afternoon and hs for muscle spasm 9)  Dextroamphetamine Sulfate Cr 10 Mg Xr24h-cap (Dextroamphetamine sulfate) .... 2 each am for narcolepsy, 10)   Metrogel-vaginal 0.75 % Gel (Metronidazole) .... Insert 1 applicator hs, repear if needed vaginitis 11)  Zoloft 100 Mg Tabs (Sertraline hcl) .Marland Kitchen.. 1 once daily for depression 12)  Fluconazole 150 Mg Tabs (Fluconazole) .Marland Kitchen.. 1  now then 1 in 1 week for yeat 13)  Furosemide 40 Mg Tabs (Furosemide) .... 2 am 2 midafternoon 14)  Lomotil 2.5-0.025 Mg Tabs (Diphenoxylate-atropine) 15)  Magicmouthwash Hc  .Marland Kitchen.. 1 tsp in mouth, rinse gargle and swallow 16)  Pramipexole Dihydrochloride 1 Mg Tabs (Pramipexole dihydrochloride) .Marland Kitchen.. 1 by mouth three times a day 17)  Lamisil 250 Mg Tabs (Terbinafine hcl) .Marland Kitchen.. 1 by mouth once daily 18)  Ciprofloxacin Hcl 500 Mg Tabs (Ciprofloxacin hcl) .Marland Kitchen.. 1 two times a day for urinary tact infection 19)  Voltaren 1 % Gel (Diclofenac sodium) .... Apply tid 20)  Pyridium 200 Mg Tabs (Phenazopyridine hcl) .... One by mouth three times a day dysuria 21)  Azor 10-20 Mg Tabs (Amlodipine-olmesartan) .Marland Kitchen.. 1 once daily for hypertensipon 22)  Plavix 75 Mg Tabs (Clopidogrel bisulfate) .Marland Kitchen.. 1 once daily to prevent clots  Patient Instructions: 1)  restart furosemide as instructed morning and mid afternoon 2)  Take meclizine 25 mg morning and afternoon bedtime for dizziness 3)  Continue other medications as prescribed 4)  Call or return when needed

## 2010-11-01 NOTE — Medication Information (Signed)
Summary: Order for Diabetic Testing Supplies  Order for Diabetic Testing Supplies   Imported By: Maryln Gottron 06/22/2010 11:02:55  _____________________________________________________________________  External Attachment:    Type:   Image     Comment:   External Document

## 2010-11-01 NOTE — Assessment & Plan Note (Signed)
Summary: ha/trouble talking last night/njr   Vital Signs:  Patient profile:   77 year old female Weight:      222 pounds O2 Sat:      97 % Temp:     98.5 degrees F Pulse rate:   105 / minute BP sitting:   154 / 82  (left arm) Cuff size:   large  Vitals Entered By: Pura Spice, RN (October 20, 2009 1:43 PM) CC: headache  states she thinks her speech was garbled last nite and wants urine checked in which she states a'alot better"  Is Patient Diabetic? Yes Did you bring your meter with you today? No   History of Present Illness: this 77 year old white female with known hypertension relates that last evening she appeared about 20 minutes when her speech was garbled she was aware of her environment was unable with state clearly distended last 20-30 minutes. All of this the patient was okay with no problems urine this has never happened. Described as a transit ischemic attack On arrival blood pressure was 154/82 but 20 minutes later was 130/78 Patient relates her cystitis symptoms have improved and would like a repeat urinalysis today CBGs have been running 140-150, discussed decreasing carbs andand exercise and if possible Discuss the status of restless leg syndrome and finds it is necessary to continue treatment  Allergies: 1)  ! * Pencillin 2)  ! * Cleomycin 3)  ! Sulfa  Past History:  Past Medical History: Last updated: 12/23/2007 Depression Diabetes mellitus, type I Hypertension Coronary artery disease Current Problems:  ESOPHAGEAL REFLUX (ICD-530.81) DIARRHEA, CHRONIC (ICD-787.91) ONYCHOMYCOSIS (ICD-110.1) DEGENERATIVE JOINT DISEASE (ICD-715.90) CORONARY ARTERY DISEASE (ICD-414.00) OBESITY (ICD-278.00) SYNDROME, RESTLESS LEGS (ICD-333.94) HYPERTENSION (ICD-401.9) Colon Polyp Diverticulosis DIABETES MELLITUS, TYPE I (ICD-250.01) DEPRESSION (ICD-311)  Past Surgical History: Last updated: 08/03/2009 Cataract extraction Hysterectomy TonsillectomylL Low Back  Surgery  X 2  cardiac cath oct 2010  Risk Factors: Smoking Status: never (03/01/2007)  Review of Systems      See HPI General:  See HPI; Denies chills, fatigue, fever, loss of appetite, malaise, sleep disorder, sweats, weakness, and weight loss. Eyes:  Denies blurring, discharge, double vision, eye irritation, eye pain, halos, itching, light sensitivity, red eye, vision loss-1 eye, and vision loss-both eyes. ENT:  Denies decreased hearing, difficulty swallowing, ear discharge, earache, hoarseness, nasal congestion, nosebleeds, postnasal drainage, ringing in ears, sinus pressure, and sore throat. CV:  Denies bluish discoloration of lips or nails, chest pain or discomfort, difficulty breathing at night, difficulty breathing while lying down, fainting, fatigue, leg cramps with exertion, lightheadness, near fainting, palpitations, shortness of breath with exertion, swelling of feet, swelling of hands, and weight gain; past coronary artery disease, also hypertension. Resp:  Denies chest discomfort, chest pain with inspiration, cough, coughing up blood, excessive snoring, hypersomnolence, morning headaches, pleuritic, shortness of breath, sputum productive, and wheezing. GI:  Denies abdominal pain, bloody stools, change in bowel habits, constipation, dark tarry stools, diarrhea, excessive appetite, gas, hemorrhoids, indigestion, loss of appetite, nausea, vomiting, vomiting blood, and yellowish skin color. GU:  See HPI; Complains of dysuria and urinary frequency. MS:  Complains of low back pain. Neuro:  See HPI; dysphagia last seen in.  Physical Exam  General:  Well-developed,well-nourished,in no acute distress; alert,appropriate and cooperative throughout examination Lungs:  Normal respiratory effort, chest expands symmetrically. Lungs are clear to auscultation, no crackles or wheezes. Heart:  Normal rate and regular rhythm. S1 and S2 normal without gallop, murmur, click, rub or other extra  sounds. Msk:  No deformity or scoliosis noted of thoracic or lumbar spine.   Extremities:  No clubbing, cyanosis, edema, or deformity noted with normal full range of motion of all joints.   Neurologic:  No cranial nerve deficits noted. Station and gait are normal. Plantar reflexes are down-going bilaterally. DTRs are symmetrical throughout. Sensory, motor and coordinative functions appear intact.   Impression & Recommendations:  Problem # 1:  TRANSIENT ISCHEMIC ATTACK (ICD-435.9) Assessment New  Her updated medication list for this problem includes:    Plavix 75 Mg Tabs (Clopidogrel bisulfate) .Marland Kitchen... 1 once daily to prevent clots  Orders: Prescription Created Electronically [phone])  Problem # 2:  URINARY TRACT INFECTION (ICD-599.0) Assessment: Improved  Her updated medication list for this problem includes:    Vesicare 5 Mg Tabs (Solifenacin succinate) .Marland Kitchen... Take 1 tablet by mouth once a day    Ciprofloxacin Hcl 500 Mg Tabs (Ciprofloxacin hcl) .Marland Kitchen... 1 two times a day for urinary tact infection    Pyridium 200 Mg Tabs (Phenazopyridine hcl) ..... One by mouth three times a day dysuria  Orders: UA Dipstick w/o Micro (automated)  (81003)  Problem # 3:  PERIPHERAL VASCULAR DISEASE (ICD-443.9) Assessment: Unchanged  Her updated medication list for this problem includes:    Plavix 75 Mg Tabs (Clopidogrel bisulfate) .Marland Kitchen... 1 once daily to prevent clots  Problem # 4:  ACUTE CYSTITIS (ICD-595.0) Assessment: Improved  Her updated medication list for this problem includes:    Vesicare 5 Mg Tabs (Solifenacin succinate) .Marland Kitchen... Take 1 tablet by mouth once a day    Ciprofloxacin Hcl 500 Mg Tabs (Ciprofloxacin hcl) .Marland Kitchen... 1 two times a day for urinary tact infection    Pyridium 200 Mg Tabs (Phenazopyridine hcl) ..... One by mouth three times a day dysuria  Problem # 5:  DIABETES MELLITUS, TYPE II (ICD-250.00) Assessment: Unchanged  The following medications were removed from the medication  list:    Azor 10-20 Mg Tabs (Amlodipine-olmesartan) .Marland Kitchen... 1 by mouth once daily Her updated medication list for this problem includes:    Glucotrol Xl 10 Mg Tb24 (Glipizide) .Marland Kitchen... Take 2 tablets once daily    Lantus Solostar 100 Unit/ml Soln (Insulin glargine) ..... Inject 80 units sd daily    Azor 10-20 Mg Tabs (Amlodipine-olmesartan) .Marland Kitchen... 1 once daily for hypertensipon  Problem # 6:  CORONARY ARTERY DISEASE (ICD-414.00) Assessment: Improved  The following medications were removed from the medication list:    Azor 10-20 Mg Tabs (Amlodipine-olmesartan) .Marland Kitchen... 1 by mouth once daily Her updated medication list for this problem includes:    Furosemide 40 Mg Tabs (Furosemide) .Marland Kitchen... 2 am 2 midafternoon    Azor 10-20 Mg Tabs (Amlodipine-olmesartan) .Marland Kitchen... 1 once daily for hypertensipon    Plavix 75 Mg Tabs (Clopidogrel bisulfate) .Marland Kitchen... 1 once daily to prevent clots  Problem # 7:  HYPERTENSION (ICD-401.9) Assessment: Improved  The following medications were removed from the medication list:    Azor 10-20 Mg Tabs (Amlodipine-olmesartan) .Marland Kitchen... 1 by mouth once daily Her updated medication list for this problem includes:    Furosemide 40 Mg Tabs (Furosemide) .Marland Kitchen... 2 am 2 midafternoon    Azor 10-20 Mg Tabs (Amlodipine-olmesartan) .Marland Kitchen... 1 once daily for hypertensipon  Complete Medication List: 1)  Glucotrol Xl 10 Mg Tb24 (Glipizide) .... Take 2 tablets once daily 2)  Hydrocodone-acetaminophen 10-650 Mg Tabs (Hydrocodone-acetaminophen) .... Take 1 tablet by mouth every four to six hours no early refill 3)  Lantus Solostar 100 Unit/ml Soln (Insulin glargine) .... Inject 80 units sd  daily 4)  Promethazine Hcl 25 Mg Tabs (Promethazine hcl) .... Take 1 tablet by mouth every  4 hrs as needed nausea 5)  Vesicare 5 Mg Tabs (Solifenacin succinate) .... Take 1 tablet by mouth once a day 6)  Ambien 10 Mg Tabs (Zolpidem tartrate) .Marland Kitchen.. 1 hs for sleep 7)  Protonix 40 Mg Tbec (Pantoprazole sodium) .... Once  daily 8)  Flexeril 10 Mg Tabs (Cyclobenzaprine hcl) .Marland Kitchen.. 1 morn mid afternoon and hs for muscle spasm 9)  Dextroamphetamine Sulfate Cr 10 Mg Xr24h-cap (Dextroamphetamine sulfate) .... 2 each am for narcolepsy, 10)  Metrogel-vaginal 0.75 % Gel (Metronidazole) .... Insert 1 applicator hs, repear if needed vaginitis 11)  Zoloft 100 Mg Tabs (Sertraline hcl) .Marland Kitchen.. 1 once daily for depression 12)  Fluconazole 150 Mg Tabs (Fluconazole) .Marland Kitchen.. 1  now then 1 in 1 week for yeat 13)  Furosemide 40 Mg Tabs (Furosemide) .... 2 am 2 midafternoon 14)  Lomotil 2.5-0.025 Mg Tabs (Diphenoxylate-atropine) 15)  Magicmouthwash Hc  .Marland Kitchen.. 1 tsp in mouth, rinse gargle and swallow 16)  Pramipexole Dihydrochloride 1 Mg Tabs (Pramipexole dihydrochloride) .Marland Kitchen.. 1 by mouth three times a day 17)  Lamisil 250 Mg Tabs (Terbinafine hcl) .Marland Kitchen.. 1 by mouth once daily 18)  Ciprofloxacin Hcl 500 Mg Tabs (Ciprofloxacin hcl) .Marland Kitchen.. 1 two times a day for urinary tact infection 19)  Voltaren 1 % Gel (Diclofenac sodium) .... Apply tid 20)  Pyridium 200 Mg Tabs (Phenazopyridine hcl) .... One by mouth three times a day dysuria 21)  Azor 10-20 Mg Tabs (Amlodipine-olmesartan) .Marland Kitchen.. 1 once daily for hypertensipon 22)  Plavix 75 Mg Tabs (Clopidogrel bisulfate) .Marland Kitchen.. 1 once daily to prevent clots  Patient Instructions: 1)  diagnosis of transient ischemic attack 2)  Since she cannot take aspirin I will treat you with Plavix 75 mg q.d. 3)  10 she had carotid Doppler studies done recently these do not need to be repeated 4)  2 maintain a stable blood pressure will start Azor 5)  return for repeat urinalysis in 2 weeks however have cultured present urine today and will notify you of result Prescriptions: PLAVIX 75 MG TABS (CLOPIDOGREL BISULFATE) 1 once daily to prevent clots  #30 x 11   Entered and Authorized by:   Judithann Sheen MD   Signed by:   Judithann Sheen MD on 10/20/2009   Method used:   Electronically to        [facility] [address] [phone]* (retail)       [address]       [address]       [place], [place]  [address]       Ph: [phone]       Fax: [phone]   RxID:   [id number] AZOR 10-20 MG TABS (AMLODIPINE-OLMESARTAN) 1 once daily for hypertensipon  #30 x 11   Entered and Authorized by:   Judithann Sheen MD   Signed by:   Judithann Sheen MD on 10/20/2009   Method used:   Electronically to        [facility] [address] [phone]* (retail)       [address]       [address]       [place], [place]  [address]       Ph: [phone]       Fax: [phone]   RxID:   [id number]   Appended Document: ha/trouble talking last night/njr  Laboratory Results  Urine Tests    Routine Urinalysis   Color: yellow Appearance: Clear Glucose: 1+   (Normal Range: Negative) Bilirubin: 2+   (Normal Range: Negative) Ketone: 1+   (Normal Range: Negative) Spec. Gravity: 1.010   (Normal Range: 1.003-1.035) Blood: negative   (Normal Range: Negative) pH: 5.0   (Normal Range: 5.0-8.0) Protein: 2+   (Normal Range: Negative) Urobilinogen: 0.2   (Normal Range: 0-1) Nitrite: positive   (Normal Range: Negative) Leukocyte Esterace: 3+   (Normal Range: Negative)    Comments: Rita Ohara  October 21, 2009 12:12 PM      Appended Document: ha/trouble talking last night/njr CCONTINUE TX, TO CULTURE

## 2010-11-03 NOTE — Progress Notes (Signed)
Summary: please advise of abdomen  Phone Note Call from Patient Call back at Home Phone [phone] Call back at Work Phone [phone]   Caller: Patient---triage vm Reason for Call: Acute Illness Complaint: Abdominal Pain Summary of Call: saw Dr Caryl Never on Monday with extreme pain in lower abdomen. Still hurts to a point that she is unable to walk. wants call back asap. Initial call taken by: Warnell Forester,  September 14, 2010 10:13 AM  Follow-up for Phone Call        Spoke with pt, she continues to experience abd pain, "pain pills are not touching the pain"  Whats the next step? Pt needs to be reassessed if pain no better. I called and left message. Follow-up by: Evelena Peat MD,  September 14, 2010 5:54 PM  Additional Follow-up for Phone Call Additional follow up Details #1::        pt will come in at 11:45 this morning to follow up with Dr Caryl Never as indicated above. Additional Follow-up by: Warnell Forester,  September 15, 2010 8:21 AM

## 2010-11-03 NOTE — Assessment & Plan Note (Signed)
Summary: reassess ab pain per dr burchette//ccm   Vital Signs:  Patient profile:   77 year old female Temp:     98.3 degrees F BP sitting:   160 / 86  Vitals Entered By: Sid Falcon LPN (September 15, 2010 1:10 PM)  History of Present Illness: Patient seen for followup. Referred prior note. Had more lower lower abdominal pain. CBC unremarkable. Pain at this time or left inguinal. Worse after prolonged periods of sitting or activity and worse when she ambulates. No fall or injury. No back pain. No dysuria. No appetite or weight changes.  she cannot tolerate any nonsteroidals  Patient has history of polypharmacy. We reviewed all her medications and took several off her list. Has taken hydrocodone for this pain which does not help much. Pain is moderate severity and none at rest and only with activity. We had started Cipro with concern for possible diverticulitis but at this point she seems to have more inguinal pain than abdominal pain. No masses noted.  patient also relates separate issue of right third toe pain for one month. This was injured after hitting on step. She's had some persistent redness and possibly some mild warmth. She is not recall a break in the skin.  Does have hx of diabetes  Allergies: 1)  ! * Pencillin 2)  ! * Cleomycin 3)  ! Sulfa  Past History:  Past Medical History: Last updated: 12/23/2007 Depression Diabetes mellitus, type I Hypertension Coronary artery disease Current Problems:  ESOPHAGEAL REFLUX (ICD-530.81) DIARRHEA, CHRONIC (ICD-787.91) ONYCHOMYCOSIS (ICD-110.1) DEGENERATIVE JOINT DISEASE (ICD-715.90) CORONARY ARTERY DISEASE (ICD-414.00) OBESITY (ICD-278.00) SYNDROME, RESTLESS LEGS (ICD-333.94) HYPERTENSION (ICD-401.9) Colon Polyp Diverticulosis DIABETES MELLITUS, TYPE I (ICD-250.01) DEPRESSION (ICD-311)  Past Surgical History: Last updated: 08/03/2009 Cataract extraction Hysterectomy TonsillectomylL Low Back Surgery  X 2  cardiac cath  oct 2010  Risk Factors: Smoking Status: never (03/01/2007) PMH-FH-SH reviewed for relevance  Review of Systems  The patient denies anorexia, fever, weight loss, chest pain, abdominal pain, melena, hematochezia, severe indigestion/heartburn, and hematuria.    Physical Exam  General:  Well-developed,well-nourished,in no acute distress; alert,appropriate and cooperative throughout examination Neck:  No deformities, masses, or tenderness noted. Lungs:  Normal respiratory effort, chest expands symmetrically. Lungs are clear to auscultation, no crackles or wheezes. Heart:  normal rate and regular rhythm.   Abdomen:  soft, non-tender, no distention, and no masses.   Extremities:  good range of motion left hip. She has some nonspecific tenderness around the anterior superior iliac spine near attachment to the sartorius. Minimal pain with hip flexion. No inguinal adenopathy. No hernia palpated R third toe mild edema and distal tenderness with mild erythema but no warmth. Neurologic:  no strength deficits noted.   Impression & Recommendations:  Problem # 1:  HIP PAIN (ICD-719.45) This seems to be more inguinal.  Check films Her updated medication list for this problem includes:    Hydrocodone-acetaminophen 10-650 Mg Tabs (Hydrocodone-acetaminophen) .Marland Kitchen... Take 1 tablet by mouth every four to six hours no early refill    Flexeril 10 Mg Tabs (Cyclobenzaprine hcl) .Marland Kitchen... 1 morn mid afternoon and hs for muscle spasm    Tramadol Hcl 50 Mg Tabs (Tramadol hcl) .Marland Kitchen... 1 qicd to prevent cough  Orders: T-Hip Comp Left Min 2-views (73510TC)  Problem # 2:  TOE PAIN (ICD-729.5) Assessment: New SHe is aware if no fx nothing else to do.  R/O osteomyelitis given persistent swelling and pain. Orders: T-Toe(s) (73660TC)  Problem # 3:  ABDOMINAL PAIN, LEFT LOWER QUADRANT (  ICD-789.04) Assessment: Improved at this point abd is nontender.  Complete Medication List: 1)  Glucotrol Xl 10 Mg Tb24 (Glipizide)  .... Take 2 tablets once daily 2)  Hydrocodone-acetaminophen 10-650 Mg Tabs (Hydrocodone-acetaminophen) .... Take 1 tablet by mouth every four to six hours no early refill 3)  Lantus Solostar 100 Unit/ml Soln (Insulin glargine) .... Inject 80 units sd daily 4)  Promethazine Hcl 25 Mg Tabs (Promethazine hcl) .... Take 1 tablet by mouth every  4 hrs as needed nausea 5)  Vesicare 5 Mg Tabs (Solifenacin succinate) .... Take 1 tablet by mouth once a day 6)  Ambien 10 Mg Tabs (Zolpidem tartrate) .Marland Kitchen.. 1 hs for sleep 7)  Flexeril 10 Mg Tabs (Cyclobenzaprine hcl) .Marland Kitchen.. 1 morn mid afternoon and hs for muscle spasm 8)  Dextroamphetamine Sulfate Cr 10 Mg Xr24h-cap (Dextroamphetamine sulfate) .... 2 each am for narcolepsy, 9)  Pramipexole Dihydrochloride 1 Mg Tabs (Pramipexole dihydrochloride) .Marland Kitchen.. 1 by mouth three times a day 10)  Voltaren 1 % Gel (Diclofenac sodium) .... Apply tid 11)  Plavix 75 Mg Tabs (Clopidogrel bisulfate) .Marland Kitchen.. 1 once daily to prevent clots 12)  Tramadol Hcl 50 Mg Tabs (Tramadol hcl) .Marland Kitchen.. 1 qicd to prevent cough 13)  Hyzaar 100-25 Mg Tabs (Losartan potassium-hctz) .Marland Kitchen.. 1 once daily for blood pressure 14)  Furosemide 80 Mg Tabs (Furosemide) .Marland Kitchen.. 1 morning midaternoon for edema 15)  Ciprofloxacin Hcl 500 Mg Tabs (Ciprofloxacin hcl) .Marland Kitchen.. 1 by mouth two times a day for 10 days  Other Orders: T-Pelvis 1or 2 views (72170TC)   Orders Added: 1)  T-Hip Comp Left Min 2-views [73510TC] 2)  T-Pelvis 1or 2 views [72170TC] 3)  T-Toe(s) [73660TC] 4)  Est. Patient Level IV [[address]]

## 2010-11-03 NOTE — Progress Notes (Signed)
Summary: would like a call back today  Phone Note Call from Patient Call back at Home Phone [phone]   Caller: Patient---live call Summary of Call: wants Dr Scotty Court to return her call regarding surgery with Dr Ethelene Hal. wants to be worked in next week. Initial call taken by: Warnell Forester,  September 29, 2010 3:38 PM  Follow-up for Phone Call        spoke with patient Follow-up by: Kern Reap CMA Duncan Dull),  September 30, 2010 11:20 AM

## 2010-11-03 NOTE — Assessment & Plan Note (Signed)
Summary: pts bladder has fallen/pt in pain/cjr   Vital Signs:  Patient profile:   77 year old female Weight:      230 pounds Temp:     98.3 degrees F oral BP sitting:   140 / 74  (left arm) Cuff size:   large  Vitals Entered By: Sid Falcon LPN (October 05, 2010 9:33 AM)  History of Present Illness: Has recently noted bladder prolapse.  Has some stress incontinence which is not new. Does have some progressive dysuria with increased latency to urinate and difficulty emptying bladder.  Hx of prior hysterectomy.  No burning with urination at this time. No gross hematuria.  No fever or chills.  Recent UA glucosuria (pt known diabetic) otherwise unremarkable.  Continued poorly localized lower abd/pelvic pain for several weeks if not months. CT abd/pelvis as previously described.  See prior notes.  Pt also requesting refills of medications.  Has used Flexeril in past for some back pain and requests refills.  She is aware of possible side effects.  Also ketoconazole as needed for  fungal rash under breast.  She has chronic low back pain which is unchanged. No raduculopathy symptoms.  No urine incontinence other than stress incontinence.  Clinical Review Panels:  Prevention   Last Colonoscopy:  Done (11/26/2002)   Allergies: 1)  ! * Pencillin 2)  ! * Cleomycin 3)  ! Sulfa  Past History:  Past Medical History: Last updated: 12/23/2007 Depression Diabetes mellitus, type I Hypertension Coronary artery disease Current Problems:  ESOPHAGEAL REFLUX (ICD-530.81) DIARRHEA, CHRONIC (ICD-787.91) ONYCHOMYCOSIS (ICD-110.1) DEGENERATIVE JOINT DISEASE (ICD-715.90) CORONARY ARTERY DISEASE (ICD-414.00) OBESITY (ICD-278.00) SYNDROME, RESTLESS LEGS (ICD-333.94) HYPERTENSION (ICD-401.9) Colon Polyp Diverticulosis DIABETES MELLITUS, TYPE I (ICD-250.01) DEPRESSION (ICD-311)  Past Surgical History: Last updated: 08/03/2009 Cataract extraction Hysterectomy TonsillectomylL Low  Back Surgery  X 2  cardiac cath oct 2010  Risk Factors: Smoking Status: never (03/01/2007) PMH-FH-SH reviewed for relevance  Review of Systems  The patient denies anorexia, fever, weight loss, weight gain, chest pain, syncope, dyspnea on exertion, peripheral edema, prolonged cough, melena, hematochezia, severe indigestion/heartburn, suspicious skin lesions, and enlarged lymph nodes.    Physical Exam  General:  Well-developed,well-nourished,in no acute distress; alert,appropriate and cooperative throughout examination Head:  Normocephalic and atraumatic without obvious abnormalities. No apparent alopecia or balding. Mouth:  Oral mucosa and oropharynx without lesions or exudates.  Teeth in good repair. Neck:  No deformities, masses, or tenderness noted. Lungs:  Normal respiratory effort, chest expands symmetrically. Lungs are clear to auscultation, no crackles or wheezes. Heart:  normal rate and regular rhythm.   Abdomen:  soft, normal bowel sounds, no distention, no masses, no guarding, and no rigidity.  minimally tender R lower quadrant. Genitalia:  bimanual.  Mild cystocele.  no masses palpated.  Hx hysterctomy so no uterus or cervix.   Impression & Recommendations:  Problem # 1:  CYSTOCELE WITHOUT MENTION UTERINE PROLAPSE LAT (ICD-618.02) rec referral back to urologist since she is having some ?assoc pains.  Problem # 2:  BACK PAIN, CHRONIC (ICD-724.5)  Her updated medication list for this problem includes:    Hydrocodone-acetaminophen 10-650 Mg Tabs (Hydrocodone-acetaminophen) .Marland Kitchen... Take 1 tablet by mouth every four to six hours no early refill    Flexeril 10 Mg Tabs (Cyclobenzaprine hcl) .Marland Kitchen... 1 morn mid afternoon and hs for muscle spasm    Tramadol Hcl 50 Mg Tabs (Tramadol hcl) .Marland Kitchen... 1 qicd to prevent cough  Complete Medication List: 1)  Glucotrol Xl 10 Mg Tb24 (Glipizide) .Marland KitchenMarland KitchenMarland Kitchen  Take 2 tablets once daily 2)  Hydrocodone-acetaminophen 10-650 Mg Tabs (Hydrocodone-acetaminophen)  .... Take 1 tablet by mouth every four to six hours no early refill 3)  Lantus Solostar 100 Unit/ml Soln (Insulin glargine) .... Inject 80 units sd daily 4)  Promethazine Hcl 25 Mg Tabs (Promethazine hcl) .... Take 1 tablet by mouth every  4 hrs as needed nausea 5)  Vesicare 5 Mg Tabs (Solifenacin succinate) .... Take 1 tablet by mouth once a day 6)  Ambien 10 Mg Tabs (Zolpidem tartrate) .Marland Kitchen.. 1 hs for sleep 7)  Flexeril 10 Mg Tabs (Cyclobenzaprine hcl) .Marland Kitchen.. 1 morn mid afternoon and hs for muscle spasm 8)  Dextroamphetamine Sulfate Cr 10 Mg Xr24h-cap (Dextroamphetamine sulfate) .... 2 each am for narcolepsy, 9)  Pramipexole Dihydrochloride 1 Mg Tabs (Pramipexole dihydrochloride) .Marland Kitchen.. 1 by mouth three times a day 10)  Voltaren 1 % Gel (Diclofenac sodium) .... Apply tid 11)  Plavix 75 Mg Tabs (Clopidogrel bisulfate) .Marland Kitchen.. 1 once daily to prevent clots 12)  Tramadol Hcl 50 Mg Tabs (Tramadol hcl) .Marland Kitchen.. 1 qicd to prevent cough 13)  Hyzaar 100-25 Mg Tabs (Losartan potassium-hctz) .Marland Kitchen.. 1 once daily for blood pressure 14)  Furosemide 80 Mg Tabs (Furosemide) .Marland Kitchen.. 1 morning midaternoon for edema 15)  Ciprofloxacin Hcl 500 Mg Tabs (Ciprofloxacin hcl) .Marland Kitchen.. 1 by mouth two times a day for 10 days 16)  Pen Needles 5/16" 31g X 8 Mm Misc (Insulin pen needle) .... Use daily as directed 17)  Ketoconazole 2 % Crea (Ketoconazole) .... Use as directed as needed 18)  Magic Mouth Wash  .... Rinse, gargle and swallow with 1 tsp as directed  Patient Instructions: 1)  We will call you regarding urology appointment. Prescriptions: FLEXERIL 10 MG TABS (CYCLOBENZAPRINE HCL) 1 morn mid afternoon and hs for muscle spasm  #30 x 1   Entered and Authorized by:   Evelena Peat MD   Signed by:   Evelena Peat MD on 10/05/2010   Method used:   Electronically to        [facility] [address] [phone]* (retail)       [address]       [address]       [place], [place]  [address]       Ph: [phone]       Fax:  [phone]   RxID:   [id number] KETOCONAZOLE 2 % CREA (KETOCONAZOLE) use as directed as needed  #30 gm x 0   Entered and Authorized by:   Evelena Peat MD   Signed by:   Evelena Peat MD on 10/05/2010   Method used:   Electronically to        [facility] [address] [phone]* (retail)       [address]       [address]       [place], [place]  [address]       Ph: [phone]       Fax: [phone]   RxID:   [id number] PEN NEEDLES 5/16" 31G X 8 MM MISC (INSULIN PEN NEEDLE) use daily as directed  #50 x 6   Entered by:   Sid Falcon LPN   Authorized by:   Evelena Peat MD   Signed by:   Sid Falcon LPN on 16/10/930   Method used:   Electronically to        [facility] [address] [phone]* (retail)       [address]       [place]  [address]       [place], [place]  [address]       Ph: [phone]       Fax: [phone]   RxID:   [id number]    Orders Added: 1)  Est. Patient Level IV [84[address]]

## 2010-11-03 NOTE — Assessment & Plan Note (Signed)
Summary: LOWER ABD PAIN // RS   Vital Signs:  Patient profile:   77 year old female Weight:      230 pounds Temp:     98.1 degrees F oral BP sitting:   162 / 90  (left arm) Cuff size:   large  Vitals Entered By: Sid Falcon LPN (September 12, 2010 4:47 PM)  History of Present Illness: Patient seen with onset about 3 days ago pain mostly left lower quadrant abdominal spreading somewhat to the right side.  No radiation to back. She denies any fever or chills. No change of bowel habits. Pain is moderate and worse with movement such as ambulation. No history of known diverticular disease. Last colonoscopy about 7 years ago. No hematochezia. Denies any nausea or vomiting. Prior history of total abdominal hysterectomy per patient. Does have prior history of kidney stone on CT scan of one year ago-apparently incidental. Denies any back pain.  No recent injury.  Has multiple other chronic problems including Type 2 diabetes.  Allergies: 1)  ! * Pencillin 2)  ! * Cleomycin 3)  ! Sulfa  Past History:  Past Medical History: Last updated: 12/23/2007 Depression Diabetes mellitus, type I Hypertension Coronary artery disease Current Problems:  ESOPHAGEAL REFLUX (ICD-530.81) DIARRHEA, CHRONIC (ICD-787.91) ONYCHOMYCOSIS (ICD-110.1) DEGENERATIVE JOINT DISEASE (ICD-715.90) CORONARY ARTERY DISEASE (ICD-414.00) OBESITY (ICD-278.00) SYNDROME, RESTLESS LEGS (ICD-333.94) HYPERTENSION (ICD-401.9) Colon Polyp Diverticulosis DIABETES MELLITUS, TYPE I (ICD-250.01) DEPRESSION (ICD-311)  Past Surgical History: Last updated: 08/03/2009 Cataract extraction Hysterectomy TonsillectomylL Low Back Surgery  X 2  cardiac cath oct 2010  Risk Factors: Smoking Status: never (03/01/2007) PMH-FH-SH reviewed for relevance  Review of Systems       The patient complains of abdominal pain.  The patient denies anorexia, fever, weight loss, chest pain, syncope, dyspnea on exertion, peripheral edema,  hemoptysis, melena, hematochezia, severe indigestion/heartburn, and hematuria.    Physical Exam  General:  Well-developed,well-nourished,in no acute distress; alert,appropriate and cooperative throughout examination Mouth:  Oral mucosa and oropharynx without lesions or exudates.  Teeth in good repair. Neck:  No deformities, masses, or tenderness noted. Lungs:  Normal respiratory effort, chest expands symmetrically. Lungs are clear to auscultation, no crackles or wheezes. Heart:  normal rate and regular rhythm.   Abdomen:  normal bowel sounds. Nondistended. Soft with mild to moderate tenderness left lower quadrant deep palpation. Minimal tenderness right lower quadrant. No guarding or rebound tenderness. No masses palpated. Genitalia:  She has moderate sized cystocele.  No other masses palpated. Extremities:  No clubbing, cyanosis, edema, or deformity noted with normal full range of motion of all joints.   Neurologic:  alert & oriented X3 and cranial nerves II-XII intact.   Skin:  no rashes and no suspicious lesions.     Impression & Recommendations:  Problem # 1:  ABDOMINAL PAIN, LEFT LOWER QUADRANT (ICD-789.04) Assessment New ?diverticulitis.  Doubt stone.  Check UA and CBC and start Cipro and add Flagyl if not promptly improving next couple of days. Orders: UA Dipstick w/o Micro (automated)  (81003) TLB-CBC Platelet - w/Differential (85025-CBCD) Specimen Handling (47425) Venipuncture (95638)  Problem # 2:  CYSTOCELE WITHOUT MENTION UTERINE PROLAPSE LAT (ICD-618.02) Assessment: New  Complete Medication List: 1)  Glucotrol Xl 10 Mg Tb24 (Glipizide) .... Take 2 tablets once daily 2)  Hydrocodone-acetaminophen 10-650 Mg Tabs (Hydrocodone-acetaminophen) .... Take 1 tablet by mouth every four to six hours no early refill 3)  Lantus Solostar 100 Unit/ml Soln (Insulin glargine) .... Inject 80 units sd daily 4)  Promethazine  Hcl 25 Mg Tabs (Promethazine hcl) .... Take 1 tablet by mouth  every  4 hrs as needed nausea 5)  Vesicare 5 Mg Tabs (Solifenacin succinate) .... Take 1 tablet by mouth once a day 6)  Ambien 10 Mg Tabs (Zolpidem tartrate) .Marland Kitchen.. 1 hs for sleep 7)  Flexeril 10 Mg Tabs (Cyclobenzaprine hcl) .Marland Kitchen.. 1 morn mid afternoon and hs for muscle spasm 8)  Dextroamphetamine Sulfate Cr 10 Mg Xr24h-cap (Dextroamphetamine sulfate) .... 2 each am for narcolepsy, 9)  Fluconazole 150 Mg Tabs (Fluconazole) .... As needed 10)  Lomotil 2.5-0.025 Mg Tabs (Diphenoxylate-atropine) .Marland Kitchen.. 1-2 qid as needed diarrhea 11)  Magicmouthwash Hc  .Marland Kitchen.. 1 tsp in mouth, rinse gargle and swallow 12)  Pramipexole Dihydrochloride 1 Mg Tabs (Pramipexole dihydrochloride) .Marland Kitchen.. 1 by mouth three times a day 13)  Voltaren 1 % Gel (Diclofenac sodium) .... Apply tid 14)  Pyridium 200 Mg Tabs (Phenazopyridine hcl) .... One by mouth three times a day dysuria 15)  Plavix 75 Mg Tabs (Clopidogrel bisulfate) .Marland Kitchen.. 1 once daily to prevent clots 16)  Hydromet 5-1.5 Mg/45ml Syrp (Hydrocodone-homatropine) .Marland Kitchen.. 1 tsp q 4 hours as needed cough 17)  Tramadol Hcl 50 Mg Tabs (Tramadol hcl) .Marland Kitchen.. 1 qicd to prevent cough 18)  Hyzaar 100-25 Mg Tabs (Losartan potassium-hctz) .Marland Kitchen.. 1 once daily for blood pressure 19)  Furosemide 80 Mg Tabs (Furosemide) .Marland Kitchen.. 1 morning midaternoon for edema 20)  Ciprofloxacin Hcl 500 Mg Tabs (Ciprofloxacin hcl) .Marland Kitchen.. 1 by mouth two times a day for 10 days  Patient Instructions: 1)  Followup promptly for any increased fever or worsening abdominal pain or if not improving in 2-3 days Prescriptions: CIPROFLOXACIN HCL 500 MG TABS (CIPROFLOXACIN HCL) 1 by mouth two times a day for 10 days  #20 x 0   Entered and Authorized by:   Evelena Peat MD   Signed by:   Evelena Peat MD on 09/12/2010   Method used:   Electronically to        [facility] [address] [phone]* (retail)       [address]       [address]       [place], [place]  [address]       Ph: [phone]       Fax: [phone]   RxID:    [id number]    Orders Added: 1)  UA Dipstick w/o Micro (automated)  [81003] 2)  TLB-CBC Platelet - w/Differential [85025-CBCD] 3)  Specimen Handling [99000] 4)  Venipuncture [42595] 5)  Est. Patient Level IV [63875]     Laboratory Results   Urine Tests    Routine Urinalysis   Color: yellow Appearance: Clear Glucose: 2+   (Normal Range: Negative) Bilirubin: negative   (Normal Range: Negative) Ketone: negative   (Normal Range: Negative) Spec. Gravity: 1.015   (Normal Range: 1.003-1.035) Blood: trace-lysed   (Normal Range: Negative) pH: 7.0   (Normal Range: 5.0-8.0) Protein: negative   (Normal Range: Negative) Urobilinogen: 0.2   (Normal Range: 0-1) Nitrite: negative   (Normal Range: Negative) Leukocyte Esterace: negative   (Normal Range: Negative)    Comments: Rita Ohara  September 12, 2010 5:24 PM

## 2010-12-01 ENCOUNTER — Encounter: Payer: Self-pay | Admitting: Family Medicine

## 2010-12-01 ENCOUNTER — Ambulatory Visit (INDEPENDENT_AMBULATORY_CARE_PROVIDER_SITE_OTHER): Payer: Medicare Other | Admitting: Family Medicine

## 2010-12-01 DIAGNOSIS — E119 Type 2 diabetes mellitus without complications: Secondary | ICD-10-CM

## 2010-12-01 DIAGNOSIS — E039 Hypothyroidism, unspecified: Secondary | ICD-10-CM

## 2010-12-01 MED ORDER — CLOPIDOGREL BISULFATE 75 MG PO TABS: 75.0000 mg | ORAL_TABLET | Freq: Every day | ORAL | Status: DC

## 2010-12-01 MED ORDER — PRAMIPEXOLE DIHYDROCHLORIDE 1 MG PO TABS: 1.0000 mg | ORAL_TABLET | Freq: Three times a day (TID) | ORAL | Status: DC

## 2010-12-02 ENCOUNTER — Telehealth: Payer: Self-pay | Admitting: Family Medicine

## 2010-12-02 NOTE — Telephone Encounter (Signed)
Pt is at pharmacy and said that Dr Scotty Court was suppose to have called in  Nitroglycerin for chest pain. Pt is having an attack and needs this asap.

## 2010-12-05 ENCOUNTER — Encounter: Payer: Self-pay | Admitting: Family Medicine

## 2010-12-05 NOTE — Patient Instructions (Signed)
From our discussion I feel that she should have better control over her diet to help control the diabetes. Continue to take her meds her medications of which I will refill today

## 2010-12-05 NOTE — Progress Notes (Signed)
  Subjective:    Patient ID: Melanie Mcdonald, female    DOB: 1933/01/20, 77 y.o.   MRN: [id number] This 77 year old white widowed urine today to discuss her medical problems as well as renew her medication and she is a known diabetic and relates that her blood sugars have not been well controlled recently so we will check a hemoglobin A1c for evaluation. Patient has had coronary artery disease as well as TIAs as well as peripheral vascular disease and is on PlavixHPIJanice continues to have back pain which is a chronic problem as well as complaining of pain in her heels in other joints. Her problem with the ureter well syndrome has been somewhat resolved and she has not been having diarrhea as she has in the past. No history recently of nephrolithiasis. The esophageal reflux to the control she has had no chest pain nor arrhythmia or continues to take her medication for restless leg syndrome depression has been under better control    Review of SystemsC. History of present illness     Objective:   Physical Exam The patient is a well-developed well-nourished slightly obese white female who i is cooperative and pleasant HEENT not remarkable Heart exam is normal regular room no murmur Lungs are clear to palpation percussion and auscultation Abdominal exam liver spleen kidneys are nonpalpable no masses bowel sounds normal Lung plus pretibial edema       Assessment & Plan:  Patient continued to have her multiple medical problems but under better control than in the past however it appears her diabetes needs to be evaluated and possible change in treatment and also better diet control refill medications

## 2010-12-06 MED ORDER — NITROGLYCERIN 0.6 MG SL SUBL: 0.6000 mg | SUBLINGUAL_TABLET | SUBLINGUAL | Status: DC | PRN

## 2010-12-06 NOTE — Telephone Encounter (Signed)
seent niro rx

## 2010-12-22 ENCOUNTER — Other Ambulatory Visit: Payer: Self-pay | Admitting: Family Medicine

## 2010-12-27 ENCOUNTER — Telehealth: Payer: Self-pay | Admitting: Family Medicine

## 2010-12-27 ENCOUNTER — Encounter: Payer: Self-pay | Admitting: Family Medicine

## 2010-12-27 NOTE — Telephone Encounter (Signed)
Pt says Dr. Scotty Court called her about Lantus Solostar 100 units.  Pt says Dr. Scotty Court called her and told her to up her dosage she thinks? She needs clarification, if this is true, a new rx needs to be written and pt would like to pick it up.  Please advise

## 2010-12-27 NOTE — Telephone Encounter (Signed)
rx faxed to [facility] for lantus

## 2011-01-05 LAB — LIPID PANEL
HDL: 36 mg/dL — ABNORMAL LOW (ref >39)
Total CHOL/HDL Ratio: 3.6 RATIO
Triglycerides: 270 mg/dL — ABNORMAL HIGH (ref <150)
VLDL: 54 mg/dL — ABNORMAL HIGH (ref 0–40)

## 2011-01-05 LAB — POCT I-STAT, CHEM 8
Calcium, Ion: 1.12 mmol/L (ref 1.12–1.32)
Glucose, Bld: 106 mg/dL — ABNORMAL HIGH (ref 70–99)
HCT: 40 % (ref 36.0–46.0)
Hemoglobin: 13.6 g/dL (ref 12.0–15.0)
TCO2: 29 mmol/L (ref 0–100)

## 2011-01-05 LAB — COMPREHENSIVE METABOLIC PANEL
Albumin: 3.6 g/dL (ref 3.5–5.2)
BUN: 12 mg/dL (ref 6–23)
Calcium: 8.7 mg/dL (ref 8.4–10.5)
Glucose, Bld: 97 mg/dL (ref 70–99)
Sodium: 141 mEq/L (ref 135–145)
Total Protein: 6.6 g/dL (ref 6.0–8.3)

## 2011-01-05 LAB — POCT CARDIAC MARKERS
CKMB, poc: 1.7 ng/mL (ref 1.0–8.0)
Myoglobin, poc: 65.4 ng/mL (ref 12–200)
Myoglobin, poc: 95.2 ng/mL (ref 12–200)

## 2011-01-05 LAB — GLUCOSE, CAPILLARY: Glucose-Capillary: 123 mg/dL — ABNORMAL HIGH (ref 70–99)

## 2011-01-05 LAB — CK TOTAL AND CKMB (NOT AT ARMC)
CK, MB: 2 ng/mL (ref 0.3–4.0)
Relative Index: INVALID (ref 0.0–2.5)
Total CK: 77 U/L (ref 7–177)

## 2011-01-05 LAB — CBC
HCT: 36.1 % (ref 36.0–46.0)
Hemoglobin: 12.3 g/dL (ref 12.0–15.0)
Hemoglobin: 13.2 g/dL (ref 12.0–15.0)
MCHC: 33.9 g/dL (ref 30.0–36.0)
MCHC: 34.1 g/dL (ref 30.0–36.0)
MCV: 90.8 fL (ref 78.0–100.0)
MCV: 91.2 fL (ref 78.0–100.0)
RBC: 3.96 MIL/uL (ref 3.87–5.11)
RBC: 4.29 MIL/uL (ref 3.87–5.11)
WBC: 8 10*3/uL (ref 4.0–10.5)

## 2011-01-05 LAB — DIFFERENTIAL
Basophils Relative: 1 % (ref 0–1)
Eosinophils Absolute: 0.3 10*3/uL (ref 0.0–0.7)
Eosinophils Relative: 3 % (ref 0–5)
Monocytes Absolute: 0.5 10*3/uL (ref 0.1–1.0)
Monocytes Relative: 6 % (ref 3–12)
Neutrophils Relative %: 47 % (ref 43–77)

## 2011-01-05 LAB — APTT: aPTT: 26 seconds (ref 24–37)

## 2011-01-05 LAB — MAGNESIUM: Magnesium: 1.9 mg/dL (ref 1.5–2.5)

## 2011-01-05 LAB — TROPONIN I: Troponin I: <0.01 ng/mL (ref 0.00–0.06)

## 2011-01-05 LAB — BRAIN NATRIURETIC PEPTIDE: Pro B Natriuretic peptide (BNP): 42 pg/mL (ref 0.0–100.0)

## 2011-01-05 LAB — PROTIME-INR: Prothrombin Time: 13.3 seconds (ref 11.6–15.2)

## 2011-01-05 LAB — CARDIAC PANEL(CRET KIN+CKTOT+MB+TROPI): CK, MB: 1.6 ng/mL (ref 0.3–4.0)

## 2011-01-10 NOTE — Telephone Encounter (Signed)
clarified

## 2011-01-12 NOTE — Telephone Encounter (Signed)
Done per Dr. Stafford  

## 2011-01-24 ENCOUNTER — Telehealth: Payer: Self-pay | Admitting: Family Medicine

## 2011-01-24 MED ORDER — DEXTROAMPHETAMINE SULFATE 10 MG PO CP24: ORAL_CAPSULE | ORAL | Status: DC

## 2011-01-24 NOTE — Telephone Encounter (Signed)
Per Dr. Scotty Court ok to give rx for 3 months; pt is aware rx is ready for pick up

## 2011-01-24 NOTE — Telephone Encounter (Signed)
Pt called req written script for Dextroamphetamines ER 10mg  caps.

## 2011-02-14 NOTE — Assessment & Plan Note (Signed)
[facility]                                 ON-CALL NOTE   NAME:Melanie Mcdonald, Melanie Mcdonald                         MRN:          [id number]  DATE:02/10/2007                            DOB:          1933/03/25    Patient of Dr. Scotty Court.   Call from [phone] at 6:54 p.m. on Feb 10, 2007 stating her blood sugar  is 53 and she is very weak.  She states that her blood sugar was running  at 500 on Monday and Dr. Scotty Court added Lantus 50 units a night just to  use for 5 days and today was the last day.  I recommended since she is  not very symptomatic to try to eat crackers or a piece of fruit to try  to elevate the blood sugar slowly and then she should call Dr. Scotty Court  in the morning for further instructions, otherwise if she is unable to  get her blood sugar up she should go to the emergency room.     Lelon Perla, DO  Electronically Signed    Shawnie Dapper  DD: 02/10/2007  DT: 02/11/2007  Job #: [id number]   cc:   Ellin Saba., MD

## 2011-02-14 NOTE — H&P (Signed)
NAME:  Melanie Mcdonald, Melanie Mcdonald NO.:  [id number]   MEDICAL RECORD NO.:  [id number]          PATIENT TYPE:  INP   LOCATION:  1240                         FACILITY:  [facility]   PHYSICIAN:  Therisa Doyne, MD    DATE OF BIRTH:  01/29/1933   DATE OF ADMISSION:  06/19/2008  DATE OF DISCHARGE:  06/21/2008                              HISTORY & PHYSICAL   PRIMARY CARE Glorie Dowlen:  Dr. Scotty Court.   CHIEF COMPLAINT:  Hypoglycemia.   HISTORY OF PRESENT ILLNESS:  A 77 year old white female with past  medical history significant for diabetes mellitus on insulin therapy who  presents with a hypoglycemic episode.  Of note, the patient reports  noncompliance with her insulin regimen saying that she frequently  forgets to give herself dosages.  She thinks that she last took Lantus  80 units at 4:00 p.m. today.  Subsequently after that she began feeling  very fatigued and neuroglycopenic symptoms.  She became lightheaded,  dizzy and diaphoretic.  A friend checked her blood sugar, and it was  noted to be 60.  She was brought to the emergency department for further  evaluation.   Of note, the patient is also complaining of intermittent left-sided  chest pressure over the past 1 week.  This has been located in her  substernal region radiating to her left neck.  She reports that over the  past 1 year she has had a progressive decrease in her exercise  tolerance.   REVIEW OF SYSTEMS:  All systems reviewed are negative accept as  mentioned above in the history of present illness.   PAST MEDICAL HISTORY:  1. Diabetes mellitus type 2.  2. Hypertension.  3. Hyperlipidemia.   SOCIAL HISTORY:  The patient denies tobacco, alcohol or drug.   FAMILY HISTORY:  Is noncontributory to the HPI.   MEDICATIONS:  1. Lantus Solostar 80 units daily.  2. Glucophage 1 g every morning.  3. Vicodin p.r.n.   ALLERGIES:  1. ASPIRIN.  2. PENICILLIN.  3. SULFA.   PHYSICAL EXAM:  VITAL SIGNS:  Temperature  97.0.  Blood pressure 141/62.  Pulse 85.  Respirations 18.  Oxygen saturation 98% room air.  GENERAL:  No acute distress.  HEENT:  Normocephalic, atraumatic.  Pupils are equal, round and reactive  to light and accommodation.  Extraocular movements are intact.  Oropharynx pink and moist without any lesions.  NECK:  Supple.  No lymphadenopathy.  No jugular venous distention.  No  mass.  CARDIOVASCULAR:  Regular rate and rhythm.  No murmurs, rubs, or  gallops.  CHEST:  Clear to auscultation bilaterally.  ABDOMEN:  Positive bowel sounds, soft, nontender, nondistended.  EXTREMITIES:  No clubbing, cyanosis or edema.  Dorsalis pedis pulse 2+  bilaterally.   LABORATORY STUDIES:  CBC within normal limits.  BMP within normal limits  with a glucose of 102 and troponin less than 0.05.   Chest x-ray:  No acute cardiopulmonary disease.  EKG:  Showed normal sinus rhythm at 80 beats per minute with evidence of  an old inferior myocardial infarction.   IMPRESSION AND PLAN:  1. Admit the patient to [facility] under Dr.      Diamantina Monks care to a telemetry unit.   1. Hypoglycemia.  I suspect that this was likely iatrogenic in nature      from excessive exogenous insulin.  Also, the patient reports that      she frequently forgets to give herself insulin and I am concerned      that maybe she gave herself an extra dose inadvertently.      Additionally, she may have insulin stacking as she is on only long-      acting insulin.  Irregardless, we will hold all insulin therapy at      this time and monitor her blood sugars.  We will check every 3 hour      fingerstick blood sugars.  Should her blood sugars come greater      than 200, we will consider using NovoLog short-acting insulin as      sliding scale as needed.  We will check liver function tests, TSH,      and hemoglobin A1c.  The patient will need diabetic counseling.   1. Chest pain and progressive decrease in exercise  tolerance.  She has      had multiple cardiovascular risk factors.  This could be concerning      for angina.  We will rule the patient out for myocardial infarction      with serial cardiac enzymes and monitor on telemetry.  We will      start aspirin, Lopressor and Lipitor and consider stress test if      the patient rules out.   1. Diabetes mellitus as above.  We are holding her current diabetes      medications because of her hypoglycemic episodes.  We will check      hemoglobin A1c, she will need aggressive diabetes education and      counseling prior to discharge.   1. Electrolytes, nutrition.  Saline, IV fluids, electrolytes are      stable, consistent carbohydrate diet.   1. Deep vein thrombosis prophylaxis:  Lovenox.      Therisa Doyne, MD  Electronically Signed     SJT/MEDQ  D:  06/20/2008  T:  06/22/2008  Job:  [id number]

## 2011-02-14 NOTE — Assessment & Plan Note (Signed)
[facility]                         GASTROENTEROLOGY OFFICE NOTE   NAME:Melanie Mcdonald, Melanie Mcdonald                       MRN:          [id number]  DATE:11/06/2007                            DOB:          07-Feb-1933    PROBLEMS:  1. Diarrhea.  2. Pyrosis.   Melanie Mcdonald is a 77 year old white female, referred through the courtesy  of Dr. Scotty Court for evaluation.  For years, she has suffered from  diarrhea.  Over the last six to eight months, symptoms have clearly  worsened.  At this point, she has four to five bowel movements a day and  occasionally awakens to move her bowels.  She has severe urgency with  frequent stool incontinence.  She has a history of rectal condylomata  that were lasered by Dr. Kendrick Ranch.  She denies rectal bleeding or  abdominal pain.  She also complains of frequent pyrosis.  She denies  dysphagia.   PAST MEDICAL HISTORY:  Pertinent for hypertension, asthma, diabetes.  She suffers from depression, has arthritis.  She apparently had a blood  disorder in 1964, requiring blood transfusions for several months.  She  has a history of pneumonia.  She has had several back surgeries,  hysterectomy and appendectomy.   FAMILY HISTORY:  Noncontributory.   MEDICATIONS:  Include glipizide, insulin, Mirapex, __________  and  Vesicare.   She is allergic to PENICILLIN, FLAGYL, ERYTHROMYCIN and ARTHRITIS  MEDICINES.   She neither smokes nor drinks.  She is widowed and retired.   REVIEW OF SYSTEMS:  Positive for feet swelling, frequent cough, joint  pain, excess thirst, fatigue and back pain.   PHYSICAL EXAMINATION:  Pulse 80, blood pressure 140/80, weight 232.  HEENT: EOMI.  PERRLA.  Sclerae are anicteric.  Conjunctivae are pink.  NECK:  Supple without thyromegaly, adenopathy or carotid bruits.  CHEST:  Clear to auscultation and percussion without adventitious  sounds.  CARDIAC:  Regular rhythm; normal S1 S2.  There are no murmurs, gallops  or  rubs.  ABDOMEN:  She has laxity of her anterior abdominal wall midline with  abdominal wall flexion.  Bowel sounds are normoactive.  Abdomen is soft,  nontender and nondistended.  There are no abdominal masses, tenderness,  splenic enlargement or hepatomegaly.  EXTREMITIES:  Full range of motion.  No cyanosis, clubbing or edema.  RECTAL:  Deferred.   IMPRESSION:  1. Chronic diarrhea with incontinence.  This could be related to      bacterial overgrowth, secondary to her diabetes.  Microscopic      colitis is also a possibility.  2. GERD.  3. Diabetes.  4. Depression.   RECOMMENDATION:  1. Trial of Xifaxan 200 mg three times a day.  2. Colonoscopy.  3. Begin Protonix 40 mg a day.  4. Upper endoscopy.     Barbette Hair. Arlyce Dice, MD,FACG  Electronically Signed    RDK/MedQ  DD: 11/06/2007  DT: 11/06/2007  Job #: [id number]   cc:   Ellin Saba., MD

## 2011-02-14 NOTE — Discharge Summary (Signed)
NAME:  Melanie Mcdonald, Melanie Mcdonald NO.:  [id number]   MEDICAL RECORD NO.:  [id number]          PATIENT TYPE:  INP   LOCATION:  1240                         FACILITY:  [facility]   PHYSICIAN:  Corwin Levins, MD      DATE OF BIRTH:  [birth year]   DATE OF ADMISSION:  06/19/2008  DATE OF DISCHARGE:  06/21/2008                               DISCHARGE SUMMARY   DISCHARGE DIAGNOSES:  1. Chest pain, noncardiac, unclear etiology otherwise.  2. Gastroesophageal reflux disease.  3. Hypertension.  4. Diabetes mellitus.  5. Hyperlipidemia.  6..  Medical noncompliance.  1. Hypoglycemic episode, seemingly to precipitate her admission,   PROCEDURES:  None.   CONSULTANTS:  None.   HISTORY AND PHYSICAL:  See that as documented date of admission.   HOSPITAL COURSE:  Melanie Mcdonald is a very nice 77 year old white female who  reports that she has had some noncompliance with insulin regimen, and,  in fact, states she feels better when her sugar is a little on the high  side, about the 150s where she has apparently negotiated somewhat loose  control with her primary physician, Dr. Scotty Court.  She also mentions  that she does have a tendency to stop her medications when she is  feeling better, although she realizes after discussion today this is  likely not a good idea, since the medications may be helping her feel  better.  She was hospitalized for hypoglycemia which was treated in the  emergency room successfully, but had also some chest discomfort, and,  given her cardiac risk factor, she was admitted for further evaluation  and management.   She was begun on telemetry which showed sinus rhythm, no acute changes.  EKG sinus rhythm, no acute changes.  CPK-MB negative x3.  Troponin I  negative x2.  She was also given aspirin and started on a new beta  blocker, Lopressor 12.5 mg q.6h.  On further discussion at the time of  discharge, she mentioned she had a __________ stress test within the  past  year.  She also mentioned she has been taking her PPI infrequently,  as she seems to have done well without this in the past, as well.  It is  unclear if this was due to cost, I am wondering.  At the time of  discharge, she is ambulatory, eating well, no further complaints, no  further chest discomfort.  Workup and evaluation essentially negative.  Her hemoglobin A1c this admission was determined to be 9.0, consistent  with poor control overall prior to admission.  She is felt at this point  to have gained maximal benefit from hospitalization, and she is to be  discharged home.  CBGs over the last 24 hours have been 120-150 on  current regimen, which has included Lantus 10 units daily and sliding  scale insulin.   DISPOSITION:  Discharged to home in good condition.   ACTIVITY:  No restrictions.   DIET:  Diabetic diet as before, to which she needs better adherence.   Encouraged to have good compliance with her medications including  insulin, daily PPI  and her new blood pressure medicines on discharge.   FOLLOWUP:  She is to follow up with Dr. Scotty Court who can hopefully  negotiate better blood sugar control with the patient, although it is  recognized that it will be difficult.   DISCHARGE MEDICATIONS:  1. Metoprolol 25 mg p.o. b.i.d.  2. Mirapex 1 mg t.i.d.  3. Glucophage 500 mg two q.a.m.  4. Skelaxin 800 mg t.i.d. p.r.n.  5. Protonix 40 mg p.o. daily.  6. Glipizide 10 mg p.o. b.i.d.  7. Lantus SoloStar 80 units subcutaneous q.p.m.  8. Zolpidem 10 mg q.h.s. p.r.n.  9. Vicodin 10/650 one p.o. q.4-6h. p.r.n. for ongoing chronic      arthritic pain.  10.Hyomax-SR one tablet p.o. b.i.d.      Corwin Levins, MD  Electronically Signed     JWJ/MEDQ  D:  06/21/2008  T:  06/23/2008  Job:  [id number]   cc:   Ellin Saba., MD  [address] [place]  [place] [address]

## 2011-02-14 NOTE — Consult Note (Signed)
NEW PATIENT CONSULTATION   Melanie Mcdonald, Melanie Mcdonald  DOB:  [birth year]                                       01/17/2010  UYQIH#:[id number]   The patient is a 77 year old female referred by Dr. Scotty Court with edema  of both lower extremities which has been present for about 10-12 months,  she states.  She has no history of deep venous thrombosis,  thrombophlebitis, bleeding ulceration or other venous issues.  She did  have some injections done by Dr. Donia Ast in the Fall of 2010, but  states that this made the situation worse.  She describes aching  hypersensitive discomfort of both lower extremities right worse than  left particularly below the knee.  She has tried wearing elastic  compression stockings, but they do not fit well and only go up to just  above the knee, she states.  She elevates her legs at night which helps,  and does not take pain medicine on a regular basis.   She had a venous duplex exam performed at [facility] which I  have reviewed today and it reveals no evidence of deep venous  obstruction or thrombus.  She has some mild superficial venous  incompetence at the saphenofemoral junction, but not throughout the  great saphenous veins.  Otherwise unremarkable.   CHRONIC MEDICAL PROBLEMS:  1. Type 1 diabetes mellitus.  2. Hypertension.  3. Coronary artery disease.  4. History of esophageal reflux.  5. Degenerative joint disease.  6. Restless leg syndrome.  7. Hypertension.   FAMILY HISTORY:  Positive for coronary artery disease in her father who  died of myocardial infarction.  Positive for diabetes throughout the  family.  Negative for stroke.   SOCIAL HISTORY:  She is widowed, has two children and is a retired  Runner, broadcasting/film/video.  She does not use tobacco or alcohol.   REVIEW OF SYSTEMS:  Positive for many areas including weight gain, chest  pain, dyspnea on exertion, bronchitis, reflux, diarrhea, occasional  constipation, urinary frequency and  lower extremity discomfort.  She had  a history of mini stroke with slurred speech, dizziness, arthritis,  depression and anxiety.  All other systems on review of systems are  negative.   PHYSICAL EXAM:  Blood pressure 161/83, heart rate 91, temperature 98.  General:  She is an obese female who is in no apparent distress, alert  and oriented x3.  Neck:  Supple.  3+ carotid pulses palpable.  No bruits  are audible.  Neurologic:  Normal.  She is alert, oriented x3.  Chest:  Clear to auscultation with no rhonchi or wheezing.  Cardiovascular:  Regular rhythm.  No murmurs.  Carotid pulses 3+ with no bruits.  Abdomen:  Soft, nontender with prominent bulge in the midline.  Musculoskeletal:  Exam reveals no major deformities.  Neurologic:  Normal.  Skin:  Free of rashes.  Lower extremity exam reveals 3+  femoral, popliteal and 2+ dorsalis pedis pulses.  She has edema from the  mid thighs to the feet which is fairly symmetrical, right slightly worse  than left with 1 cm larger circumference in the right calf.  There is no  ulceration, large bulging varicosities or severe spider veins except a  few spider veins present in the ankle area.   I reviewed the clinical records supplied by Dr. Scotty Court as well as the  venous  ultrasound study performed recently.  I have no records from  [facility] to review.   There does not appear to be any reflux in the great saphenous veins  throughout and I did do a bedside SonoSite exam on my own today and do  not think laser ablation would play a role in this patient's treatment.  I think the best treatment will be:  1. Elevation of the legs at night.  2. Well-fitting elastic compression stockings to apply early in the      morning.  3. Diuretics.   I discussed this with her and have no further recommendations for other  treatment modalities for this nice lady.     Quita Skye Hart [place], M.D.  Electronically Signed   JDL/MEDQ  D:  01/17/2010  T:  01/18/2010   Job:  [id number]   cc:   Dr. Cecille Rubin

## 2011-02-14 NOTE — Assessment & Plan Note (Signed)
[facility]                         GASTROENTEROLOGY OFFICE NOTE   NAME:WILESHelena, Mcdonald                       MRN:          [id number]  DATE:12/23/2007                            DOB:          September 23, 1933    PROBLEM:  1. GERD.  2. Diarrhea.   [name] has returned for scheduled followup.  Her esophageal reflux  symptoms are very well controlled with Protonix.  She takes this on an  intermittent basis.  Colonoscopy was pertinent for a small adenomatous  polyp.  Diverticulosis was seen.  Biopsies were negative for microscopic  colitis.  On a regimen of Xifaxan 200 mg 3 times a day for 7 days, her  diarrhea completely subsided.  It has slowly returned.   EXAM:  Pulse 72, blood pressure 130/80, weight 226.   IMPRESSION:  1. Gastroesophageal reflux disease - well-controlled with intermittent      Protonix.  2. Diarrhea.  This very well could be related to bacterial overgrowth.  3. Colon polyps and diverticulosis.   RECOMMENDATIONS:  1. Continue Protonix as needed.  2. Xifaxan 200 mg 3 times a day 5 days out of each month.     Barbette Hair. Arlyce Dice, MD,FACG  Electronically Signed    RDK/MedQ  DD: 12/23/2007  DT: 12/23/2007  Job #: [id number]   cc:   Ellin Saba., MD

## 2011-02-17 NOTE — H&P (Signed)
NAME:  Melanie Mcdonald, Melanie Mcdonald NO.:  [id number]   MEDICAL RECORD NO.:  [id number]          PATIENT TYPE:  INP   LOCATION:  NA                           [facility]ACILITY:  [facility]   PHYSICIAN:  Jene Every, M.D.    DATE OF BIRTH:  05-06-1933   DATE OF ADMISSION:  05/25/2005  DATE OF DISCHARGE:                                HISTORY & PHYSICAL   CHIEF COMPLAINT:  Bilateral lower extremity pain, left greater than right.   HISTORY:  Melanie Mcdonald is a 77 year old female who has a longstanding history  of low back and lower extremity pain that has gradually gotten worse.  She  has undergone conservative treatment without any relief of her symptoms.  She did obtain a myelogram which indicated lateral recess stenosis, L4-5,  affecting the L5 nerve root.  Exam does show positive straight leg raise on  the left that produces buttock pressure, thigh and calf pain.  EHL is 5/-5.  At this point, due to the fact the patient's pain has not been subsiding  with conservative treatment, it is felt she would benefit from decompression  of L4-5.  Risks and benefits of the surgery were discussed with the patient,  and she wishes to proceed.  Medical clearance was obtained by Dr. Scotty Court.  As of June 23. Cardiac workup included with this.   PAST MEDICAL HISTORY:  1.  Non-insulin-dependent diabetes.  2.  Hypertension.  3.  Hypercholesterolemia.  4.  Osteoarthritis.   CURRENT MEDICATIONS:  1.  Vicodin 10/650 p.r.n.  2.  Wellbutrin XL 150 mg 1 q.a.m., 1 q.p.m.  3.  Lisinopril 20 mg daily.  4.  Vesicare 5 mg daily.  5.  Zoloft 180 mg 2 p.o. q.a.m.  6.  Guaifenesin/pseudoephedrine 800/90 mg p.r.n.  7.  Benzoate 100 mg t.i.d.  8.  Mirapex 0.5 mg 1 p.o. b.i.d.  9.  Doxycycline 100 mg 1 p.o. b.i.d. for staph infection.  10. Glipizide 10 mg 2 p.o. daily.  11. Crestor 20 mg daily.  12. Avandia 4 mg 1 p.o. b.i.d.   ALLERGIES:  PENICILLIN.  Patient had a reaction when she was a child, which  caused  hives and a rash.   PAST SURGICAL HISTORY:  1.  Hysterectomy.  2.  Appendectomy.  3.  Tonsillectomy.  4.  Lumbar surgeries x2.  5.  Removal of a benign breast lump on the left.   SOCIAL HISTORY:  Patient is widowed.  She is retired.  She denies any  tobacco or alcohol consumption.  She lives in a two story home.  She does  have children who come and help her during the week but lives alone on the  weekends.   FAMILY HISTORY:  Father significant for heart disease.  Grandmother with a  history of diabetes as well as breast cancer.  Mother with history of  arthritis.  Mother also with history of lupus.   REVIEW OF SYSTEMS:  GENERAL:  The patient denies any fevers, chills, night  sweats or bleeding tendencies.  CNS:  No blurred or double vision, seizure,  headache, or paralysis.  RESPIRATORY:  Patient does note some shortness of  breath; however, had a negative cardiac workup.  She is recovering from  bronchitis pneumonia from June, 2006 and had a negative chest x-ray last  month.  She does note stable angina.  CARDIOVASCULAR:  Patient does note  stable angina, which has been related to esophageal spasm.  No orthopnea is  noted.  GU:  No hematuria or discharge.  GI:  No nausea or vomiting,  diarrhea, or constipation, melena or bloody stools.  MUSCULOSKELETAL:  As  per HPI.   PHYSICAL EXAMINATION:  VITAL SIGNS:  Pulse 76, respiratory rate 12, BP  136/78.  GENERAL:  This is a well-developed and well-nourished female sitting upright  in no acute distress.  HEENT:  Atraumatic and normocephalic.  Pupils are equal, round and reactive  to light.  EOMs are intact.  NECK:  Supple with no lymphadenopathy.  LUNGS:  The patient does have decreased breath sounds on the right, compared  to the left.  No rales, rhonchi or wheezes are noted.  BREASTS/GU:  Not examined.  Not pertinent.  HEART:  Regular rate and rhythm without murmurs, rubs or gallops.  ABDOMEN:  Soft and nontender. Nondistended.   Bowel sounds x4.  SKIN:  The patient does have a resolving area of supposedly staph infection  following a spider bite over the right areola.  There is some residual  erythema in this area; however, no evidence of infection is noted.  EXTREMITIES:  The patient does have positive straight leg raise on the left  that produces pain in the buttocks, posterior thigh and calf pain.  Straight  leg raise on the right does produce some mild buttock pain.  EHL is 5/-5.  Sensation is intact.  No evidence of clonus is noted.   IMPRESSION:  Spinal stenosis, L4-5.   PLAN:  Decompression, L4-5.      Roma Schanz, P.A.      Jene Every, M.D.  Electronically Signed    CS/MEDQ  D:  05/18/2005  T:  05/18/2005  Job:  [id number]

## 2011-02-17 NOTE — H&P (Signed)
NAME:  SHAWN, DANNENBERG NO.:  [id number]   MEDICAL RECORD NO.:  [id number]          PATIENT TYPE:  INP   LOCATION:  NA                           FACILITY:  [facility]   PHYSICIAN:  Jene Every, M.D.    DATE OF BIRTH:  [birth year]   DATE OF ADMISSION:  09/12/2005  DATE OF DISCHARGE:                                HISTORY & PHYSICAL   CHIEF COMPLAINT:  Bilateral lower extremity pain.   HISTORY:  Ms. Gahm is a pleasant 77 year old female with a long-standing  history of bilateral lower extremity pain.  She was previously scheduled to  undergo a lumbar decompression; however, developed a staph infection, and  this was postponed.  She has cleared her infection and received medical  clearance and wishes to proceed.  Previous myelogram __with________ stenosis  at L4-5 with underfilling of the L5 nerve root.  On exam, she does have  positive straight leg raise on the left with EHL of 5-/5.  Due to the fact  that patient has continued to be symptomatic, even through conservative  measures, it is recommended that we proceed with __the________  decompression.  The risks and benefits of the surgery were discussed with  the patient, and she wishes to proceed.   MEDICAL HISTORY:  1.  Noninsulin-dependent diabetes.  2.  Hypertension.  3.  Hypercholesterolemia.  4.  Abdominal hernia.  5.  Depression.  6.  Chronic cough.  7.  MRSA infection secondary to spider bite.   CURRENT MEDICATIONS:  1.  Wellbutrin XL 150 mg.  2.  Crestor 20 mg one p.o. daily.  3.  Nexium one p.o. daily.  4.  Glipizide 10 mg one p.o. b.i.d.  5.  Avandia 4 mg one p.o. b.i.d.  6.  Mirapex 0.5 two p.o. daily.  7.  Vesicare 5 mg on p.o. daily.  8.  Zoloft 100 mg one p.o. b.i.d.  9.  Lisinopril one p.o. b.i.d.; however, the patient does not take this      routinely.  10. Tessalon Perles p.r.n.  11. Guaifenesin 800/90 mg p.r.n.  12. Prochlorperazine 10 mg two p.o. daily.  13. Lorcet 10/650 p.r.n.  pain.   ALLERGIES:  1.  PENICILLIN, which causes swelling.  2.  SULFA, which causes nausea and vomiting.   PREVIOUS SURGERY:  1.  Appendectomy.  2.  Tonsillectomy.  3.  Lumbar surgery x2 and Dr. Fannie Knee.  4.  Hysterectomy.   SOCIAL HISTORY:  The patient is widowed.  She is a retired Runner, broadcasting/film/video.  She  denies any alcohol or tobacco consumption.  She has 2 children.  A person  from the church will check on her while she is at home.  She lives in a 2-  story home.   FAMILY HISTORY:  Father deceased of coronary artery disease.  Mother with a  history of hypertension.  Paternal grandmother with a history of diabetes,  as well as cancer.  Mother also with a history of arthritis and lupus.   REVIEW OF SYSTEMS:  GENERAL:  The patient denies any fever, chills, night  sweats, or bleeding tendencies.  CNS:  No blurry or double vision, seizure,  headache, or paralysis.  RESPIRATORY:  No shortness of breath, productive  cough, or hemoptysis.  CARDIOVASCULAR:  No chest pain, angina, or orthopnea.  The patient does note dyspnea on exertion; however, this is unchanged.  GU:  No dysuria, hematuria or discharge.  The patient has had a recent UTI, which  has cleared.  This was in September of this year.  GI:  No nausea, vomiting,  diarrhea, constipation, melena, or bloody stools.  MUSCULOSKELETAL:  Pertinent as in HPI.   PHYSICAL EXAMINATION:  VITAL SIGNS:  Pulse of 76, respiratory rate 20, blood  pressure 140/88.  GENERAL:  This is a well-developed, well-nourished 77 year old female  sitting upright in no acute distress.  HEENT:  Atraumatic and normocephalic.  Pupils equal, round and reactive to  light.  Extraocular movements intact.  NECK:  Supple.  No lymphadenopathy.  CHEST:  Clear to auscultation bilaterally.  No rhonchi, wheezes, or rales.  BREASTS:  Breasts examined.  There is no evidence of residual infections.  SKIN:  Clean, dry, and intact.  There is no lymphadenopathy noted.  HEART:  Regular  rate and rhythm without murmurs, gallops, or rubs.  ABDOMEN:  Soft, nontender, nondistended.  Bowel sounds x4.  SKIN:  No rashes or lesions are noted.  EXTREMITIES:  The patient has positive straight leg raise bilaterally, left  being greater than right.  EHL is 5-/5.  She has globally decreased range of  motion of the lumbar spine with painless flexion and extension.   IMPRESSION:  Spinal stenosis L4-5.   PLAN:  The patient will be admitted to [facility] to undergo  lumbar decompression at L4-5.      Roma Schanz, P.A.      Jene Every, M.D.  Electronically Signed    CS/MEDQ  D:  09/12/2005  T:  09/12/2005  Job:  [id number]

## 2011-02-17 NOTE — Assessment & Plan Note (Signed)
[facility]                                 ON-CALL NOTE   NAME:Melanie Mcdonald, Melanie Mcdonald                         MRN:          [id number]  DATE:12/15/2006                            DOB:          1933-07-13    Her son calls in stating that [name] called him at home stating that  she was not feeling well, ie, dizzy, and lightheaded.  He reports that  she has diabetes and recently found out that she has not been taking her  medicine for several months.  She had checked her blood sugar and it was  about 500.  Mr. Remick states that he traveled down to her house and  noted that she continued to complain of lightheadedness.  Her blood  sugars now are about 425.   PLAN:  Given the above symptoms and significant hyperglycemia, I advised  that she be seen in the emergency department immediately.  Mr. Marich  expressed understanding.     Leanne Chang, M.D.  Electronically Signed    LA/MedQ  DD: 12/15/2006  DT: 12/16/2006  Job #: [id number]

## 2011-02-17 NOTE — Discharge Summary (Signed)
Melanie Mcdonald, Melanie Mcdonald NO.:  [id number]   MEDICAL RECORD NO.:  [id number]          PATIENT TYPE:  INP   LOCATION:  1511                         FACILITY:  [facility]   PHYSICIAN:  Jene Every, M.D.    DATE OF BIRTH:  01-Sep-1933   DATE OF ADMISSION:  09/14/2005  DATE OF DISCHARGE:  09/17/2005                                 DISCHARGE SUMMARY   ADMITTING DIAGNOSES:  1.  Severe spinal stenosis L4-5.  2.  Anxiety.  3.  Non-insulin-dependent diabetes.  4.  Hypertension.  5.  Hypercholesterolemia.  6.  Gastroesophageal reflux disease.   DISCHARGE DIAGNOSES:  1.  Severe spinal stenosis L4-5.  2.  Anxiety.  3.  Non-insulin-dependent diabetes.  4.  Hypertension.  5.  Hypercholesterolemia.  6.  Gastroesophageal reflux disease.  7.  Status post central decompression L4-5.   CONSULTS:  PT, OT, case management   PROCEDURES:  Patient was taken to the OR on September 14, 2005 to undergo  central decompression L4-5.  Surgeon:  Dr. Jene Every.  Assistant:  Roma Schanz.  Anesthesia:  General.  Complications:  None.   HISTORY:  Melanie Mcdonald is a pleasant 77 year old female with a long-standing  history of lower extremity pain that has gotten significantly worse.  She  has undergone conservative treatment without any significant relief of her  symptoms.  MRI studies were obtained which do show a fairly severe stenosis  at L4-5 bilaterally.  It is felt at this point the patient would benefit  from a central decompression.  The risks and benefits of the surgery were  discussed with the patient as well as medical clearance obtained and she  wished to proceed.   LABORATORY DATA:  Preoperative CBC shows a white cell count of 7.8,  hemoglobin 12.6, hematocrit 37.9.  These were followed throughout hospital  course.  White cell count remained normal.  She did have a slight drop in  her hemoglobin to the lowest of 10.1, hematocrit 30.5.  At time of discharge  hemoglobin is  10.7, hematocrit 32.2.  Coagulation studies done  preoperatively showed PT 13.1, INR 1, PTT 27.  Routine chemistries done  preoperatively showed a sodium 141, potassium 4.2, elevated glucose of 222.  At time of discharge sodium remained normal at 141, potassium 4, glucose is  better controlled with level of 97.  Preoperative urinalysis showed small  amount leukocyte esterase, 0-2 wbc's seen per high powered field.  Blood  type is O+.  Do not see a preoperative EKG or chest x-ray in the chart.   HOSPITAL COURSE:  The patient was admitted, taken to the OR, underwent the  above stated procedure without significant difficulty.  She was then  transferred to the PACU and then to the orthopedic floor for continued  postoperative care.  Postoperatively patient did fairly well.  She noted a  decrease in her lower extremity pain, just noted low back discomfort as  expected.  She did run a slight temperature postoperative day #1 of 100.5.  Vital signs, however, were stable.  PT/OT was consulted.  She was continued  on IV vancomycin.  Discharge planning was initiated.  Patient continued to  do fairly well postoperatively.  She advanced slowly with physical therapy,  but noted significant reduction in her lower extremity pain.  Patient was  placed on sliding scale for glucose control.  Postoperative day #3 the  patient was doing significantly better.  She had been ambulating with  therapy without significant difficulty.  She felt she was ready to be  discharged home.  Vital signs were stable.  She was afebrile.  Laboratories  were stabilized.  Neurovascular function was intact to the lower extremity.   DISPOSITION:  Patient discharged home with home therapy needs met as well as  durable medical goods.   WOUND CARE:  Change dressing daily.  It is okay for her to shower in 72  hours.  She will walk as tolerated.   DISCHARGE MEDICATIONS:  All home medications as well as Vicodin and Robaxin.   DIET:   Low calorie, low carbohydrate.   CONDITION ON DISCHARGE:  Stable.   FINAL DIAGNOSES:  Doing well status post central decompression L4-5.   FOLLOW-UP:  With Dr. Shelle Iron in approximately 10-14 days.      Roma Schanz, P.A.      Jene Every, M.D.  Electronically Signed    CS/MEDQ  D:  10/18/2005  T:  10/18/2005  Job:  [id number]

## 2011-02-17 NOTE — H&P (Signed)
NAME:  Melanie, Mcdonald NO.:  [id number]   MEDICAL RECORD NO.:  [id number]                   PATIENT TYPE:  INP   LOCATION:  3020                                 FACILITY:  [facility]   PHYSICIAN:  Marlan Palau, M.D.               DATE OF BIRTH:  [birth year]   DATE OF ADMISSION:  04/09/2003  DATE OF DISCHARGE:                                HISTORY & PHYSICAL   HISTORY OF PRESENT ILLNESS:  Melanie Mcdonald is a 77 year old right-handed  white female, born 1933/01/17, with a history of diabetes and  hypertension.  This patient was brought into the emergency room today  following an episode that occurred at home.  The patient was getting ready  to go out, went to her bedroom to get on her shoes but found that she had  difficulty doing this.  At this point, the patient was noted by other family  members to be confused, shaky, tremulous and blanched in the face.  The  patient has no recollection of about two to three hours of the day today.  The patient was noted to have nystagmus at this time.  The patient had a CBG  checked at that time with blood sugar of 161.  Blood pressure was also  checked at this time and was 147/81.  The [facility] and seemed to clear around the time that she arrived there.  The patient apparently complained of seeing double while driving the car  with horizontal double vision.  The patient had no nausea or vomiting, does  recall having a headache that began on the right temporal area spreading to  the bifrontal regions.  The patient has never had similar episodes before,  denies any prior history of hypoglycemia.  No focal numbness or weakness on  the face, arms or legs is recalled by the patient or her family.  The  patient comes to the emergency room for an evaluation and neurology is  called for further evaluation.   PAST MEDICAL HISTORY:  Past medical history is significant for:  1. History  of transient episode of confusion, loss of memory and double     vision as above.  2. Diabetes.  3. Hypertension.  4. Obesity.  5. Low back surgery x2 in the past.  6. History of bilateral cataracts.  7. History of hysterectomy.  8. Tonsillectomy.  9. History of depression.  10.      Restless leg syndrome.   MEDICATIONS:  1. Mavik 4 mg daily.  2. Promethazine 25 mg q.8h. p.r.n.  3. Prevacid 30 mg b.i.d.  4. Wellbutrin XL 150 mg -- taking three daily.  5. Zoloft 100 mg two daily.  6. Bextra 20 mg a day.  7. Ambien 10 mg daily.  8. Benzonatate 100 mg q.8h. of needed.  9. The patient is on Hydron  solution two teaspoons every four hours if     needed for cough.  10.      Glucotrol-XL 10 mg two in the morning.  11.      Ultracet if needed.  12.      Allegra-D twice daily if needed.   ALLERGIES:  The patient has allergies to NONSTEROIDAL ANTI-INFLAMMATORY  MEDICATION, SULFA DRUGS, MYCIN DRUGS, VICODIN.   HABITS:  The patient claims she does not smoke or drink.   SOCIAL HISTORY:  This patient is widowed, lives in the [place] area, has  two sons; the older son is thin and drinks too much and smokes cigarettes;  the younger son is in good health.   FAMILY MEDICAL HISTORY:  Family medical history is notable in that mother  died with lupus, father died with emphysema and MI.  The patient has one  brother, the whereabouts and health history are unknown.   REVIEW OF SYSTEMS:  Review of systems is notable for no fevers or chills.  The patient does note headache as above, denies neck stiffness, has  occasional chest pains, denies shortness of breath, denies problems  controlling bowels and bladder.  Denies any overt blackout episode.  No  prior history of similar events is noted on today's admission.   PHYSICAL EXAMINATION:  VITALS:  Blood pressure is 173/77, heart rate is 87,  respiratory rate 20, temperature -- afebrile.  GENERAL:  In general, this patient is a moderate obese  white female who is  alert and cooperative at the time of examination.  HEENT:  Head is atraumatic.  Eyes:  Pupils are equal, round and reactive to  light.  Disks are flat bilaterally.  NECK:  Neck is supple.  No carotid bruits are noted.  LUNGS:  Respiratory examination is clear.  CARDIOVASCULAR:  Examination reveals a regular rate and rhythm with no  obvious murmurs or rubs noted.  EXTREMITIES:  Extremities are without significant edema.  ABDOMEN:  Abdomen reveals positive bowel sounds.  No organomegaly or  tenderness are noted.  NEUROLOGIC:  Cranial nerves as above.  Facial symmetry is present.  The  patient has good sensation in the face to pinprick and soft touch  bilaterally, has good strength of facial muscles and muscles with head  turning and shoulder shrugs bilaterally.  Speech is well-enunciated and not  aphasic.  Motor testing reveals 5/5 strength is all fours.  Good symmetric  motor tone is noted throughout.  Sensory testing is intact to pinprick, soft  touch and vibratory sensation throughout.  The patient has good finger-to-  nose-to-finger and toe-to-finger bilaterally.  Gait was not tested.  Deep  tendon reflexes are symmetrical and normal with the exception of depression  of ankle jerk reflexes noted bilaterally and toes are neutral bilaterally.   LABORATORY VALUES:  Laboratory values are notable for white count of 9.2,  hemoglobin 12.7, hematocrit of 37.3, MCV of 89.2, platelets of 225,000; INR  of 0.9; sodium 140, potassium 4.1, chloride of 108, CO2 of 27, glucose of  234, BUN of 23, creatinine 1.0, calcium 8.9, total protein 6.6, albumin of  3.6, AST of 18, ALT of 20, alkaline phosphatase of 79, total bilirubin of  0.3.   Chest x-ray and EKG are pending at this time.   IMPRESSION:  1. Transient episode of confusion and double vision, etiology unclear, rule     out transient ischemic attack event. 2. Diabetes.  3. Hypertension.   This patient fortunately had  family members that  checked her capillary blood  glucose and blood pressure during the above event, both of which were  unremarkable.  The patient had an episode of memory disturbance, confusion,  double vision.  Need to rule out transient brain stem ischemia.  Need to  rule out a seizure-type event as well as patient is on both Zoloft and  Wellbutrin at this time.  We will proceed with further workup at this point.   PLAN:  1. Admission to [facility].  2. MRI of the brain.  3. MR angiogram.  4. Two-dimensional echocardiogram.  5. EEG study.  6. Follow clinical course while in house.  7. We will treat with Plavix at this time.                                               Marlan Palau, M.D.    CKW/MEDQ  D:  04/09/2003  T:  04/10/2003  Job:  [id number]   cc:   Ellin Saba., M.D.  [address] [place]  [place] [address]  Fax: [phone]    cc:   Ellin Saba., M.D.  [address] [place]  [place] [address]  Fax: [phone]

## 2011-02-17 NOTE — Op Note (Signed)
   NAME:  Melanie Mcdonald, Melanie Mcdonald                          ACCOUNT NO.:  [id number]   MEDICAL RECORD NO.:  [id number]                   PATIENT TYPE:  AMB   LOCATION:  DAY                                  FACILITY:  [facility]   PHYSICIAN:  Timothy E. Earlene Plater, M.D.              DATE OF BIRTH:  26-Feb-1933   DATE OF PROCEDURE:  12/23/2002  DATE OF DISCHARGE:                                 OPERATIVE REPORT   PREOPERATIVE DIAGNOSIS:  Lesion of rectum.   POSTOPERATIVE DIAGNOSIS:  Lesion of rectum.   PROCEDURE:  Excision of lesion of rectum.   SURGEON:  Timothy E. Earlene Plater, M.D.   ANESTHESIA:  General.   INDICATIONS FOR PROCEDURE:  Ms. Oguin had a recent colonoscopy and a lesion  of the anus was biopsied and diagnosed as squamous dysplasia. She is here  now for complete excision. Her medical conditions are noted. Her CBC was  normal. Her chemistry profile was satisfactory with elevated glucose. Chest  x-ray was negative. Cardiogram was reported as low voltage. She was seen and  evaluated by anesthesia, identified and the permit signed.   DESCRIPTION OF PROCEDURE:  She was taken to the operating room, placed  supine, LMA anesthesia provided. She was placed in lithotomy, perianal area  inspected, prepped and draped in the usual fashion. The anoscope was  inserted, careful magnified vision was used ________. The only lesion was  directly right lateral where grossly appearing condylomata were present in  the anoderm. This was not rectal mucosa, it was not perianal skin but rather  anoderm. This area was injected with 0.25% Marcaine with epinephrine and  then these lesions were completely excised. We wanted to be sure we had a  complete pathological evaluation of the lesion. The specimen was placed in  formalin and sent to the lab. The wound albeit small was closed with a  running 2-0 Chromic. It was intact and was not bleeding. This completed the  procedure, Gelfoam gauze and dry sterile dressing applied.  She tolerated it  well and was awakened and taken to the recovery room in good condition.  Written and verbal instructions including 24 Vicodin with one refill were  given to the patient and family and she will be seen and followed as an  outpatient.                                               Timothy E. Earlene Plater, M.D.    TED/MEDQ  D:  12/23/2002  T:  12/23/2002  Job:  [id number]    cc:   Lacretia Leigh. Quintella Reichert, M.D.  [address] [address]  [place]  [place] [address]  Fax: [phone]   R. Arlyce Dice, M.D.

## 2011-02-17 NOTE — Discharge Summary (Signed)
NAME:  [name], [name]                          ACCOUNT NO.:  [id number]   MEDICAL RECORD NO.:  [id number]                   PATIENT TYPE:  INP   LOCATION:  3020                                 FACILITY:  [facility]   PHYSICIAN:  Marlan Palau, M.D.               DATE OF BIRTH:  11-Apr-1933   DATE OF ADMISSION:  04/09/2003  DATE OF DISCHARGE:  04/10/2003                                 DISCHARGE SUMMARY   ADMISSION DIAGNOSES:  1. Painless episode of confusion and double vision.  2. Diabetes.  3. Hypertension.   DISCHARGE DIAGNOSES:  1. Painless episode of confusion, amnesia, double vision, etiology unclear.  2. Diabetes.  3. Hypertension.   PROCEDURE:  1. MRI of the brain.  2. ___________.  3. Echocardiogram.  4. EEG.   COMPLICATIONS OF ABOVE PROCEDURES:  None.   HISTORY OF PRESENT ILLNESS:  Melanie Mcdonald is a 77 year old, right-handed,  white female born 06-05-1933 with a history of diabetes, hypertension.  The patient was brought to the [facility] Emergency Room for evaluation of  transient episode of confusion, decreased memory, double vision, nystagmus  lasting two to three hours.  The patient has no recollection of the events  that occurred during the above time frame.  The patient had gradual clearing  of her deficits.  The [facility] went to [facility] and was sent to  the [facility] Emergency Room for evaluation.   PAST MEDICAL HISTORY:  1. Transient episode of confusion and double vision as above.  2. Diabetes.  3. Hypertension.  4. Obesity.  5. Low back surgery x2.  6. Bilateral cataract surgery.  7. Hysterectomy.  8. Tonsillectomy.  9. Depression.  10.      Colon polyp resection in the past.  11.      Restless leg syndrome.   MEDICATIONS:  1. Mavik 4 mg daily.  2. Promethazine 25 mg every eight hours as needed.  3. Prevacid 30 mg b.i.d.  4. Wellbutrin 150 mg XL x3 daily.  5. Zoloft 100 mg one twice a day.  6. Bextra 20 mg.  7. Ambien 10  mg a day.  8. Benzonatate 200 mg q.8h. if needed.  9. Glucotrol XL 10 mg two daily.  10.      Ultracet if needed.  11.      Allegra-D one twice daily as needed.  12.      Mirapex 0.25 mg q.h.s.   ALLERGIES:  The patient has an intolerance to ASPIRIN, VICODIN, PENICILLIN,  SULFA DRUGS, MYCIN DRUGS   SOCIAL HISTORY:  Does not smoke or drink.  Please refer to history and physical for social history, family history,  review of systems, physical examination.   LABORATORY DATA:  Notable for a hemoglobin of 12.7, hematocrit 37.3, white  count 9.2, platelets 225.  Sodium 140, potassium 4.1, chloride 108, CO2 27,  BUN 23, creatinine 1,  glucose 234, INR 0.9, SGOT 18, SGPT 20, alk phos 79,  total bilirubin 0.3, calcium 8.9.  Urinalysis reveals 3-6 white cells, 0-2  red cells, cholesterol level of 211, triglycerides 154, HDL 51, total  cholesterol HDL ratio 4.1, LDL of 129, VLDL 31, homocystine level 8.1.  Chest x-ray - she has no evidence of acute cardiac or pulmonary process.  EKG reveals normal sinus rhythm, normal EKG, heart rate 71.   HOSPITAL COURSE:  The patient has done well during the course of  hospitalization.  The patient has had slight headaches since coming into the  hospital.  The patient set up for an MRI scan of the brain that was  unremarkable.  No acute process seen.  The patient had a normal MRI  angiography study.  The patient underwent 2-D echocardiogram that reveals  left ventricles mildly dilated.  Ejection fraction 55-60%.  Aortic valve was  mildly calcified.  Mild aortic root dilatation seen.  Moderate mitral  annular calcifications seen.  Left atrium was mildly dilated; otherwise no  cardiac source of embolism was seen.  The EEG study was done and is normal.  The patient has not had recurring episodes during this hospitalization.  The  cause of the episode is not clear.  The patient was placed on Plavix during  this hospitalization.  The patient will be discharged on  her admission  medications as stated previously plus the Plavix 75 mg a day.  A small  prescription for Darvocet-N 100 #20 given, no refills, take as needed for  headache.  The patient also has Ultracet at home.  The patient will follow  up with [facility] in four to six weeks for an  evaluation.  The patient is to contact our office if recurring event is  noted.                                                Marlan Palau, M.D.    CKW/MEDQ  D:  04/10/2003  T:  04/11/2003  Job:  [id number]

## 2011-02-17 NOTE — Op Note (Signed)
NAMEALYZABETH, PONTILLO NO.:  [id number]   MEDICAL RECORD NO.:  [id number]          PATIENT TYPE:  INP   LOCATION:  1511                         FACILITY:  [facility]   PHYSICIAN:  Jene Every, M.D.    DATE OF BIRTH:  [birth year]   DATE OF PROCEDURE:  09/14/2005  DATE OF DISCHARGE:                                 OPERATIVE REPORT   PREOPERATIVE DIAGNOSIS:  Recurrent spinal stenosis, L4-5.   POSTOPERATIVE DIAGNOSIS:  Recurrent spinal stenosis, L4-5.   PROCEDURE PERFORMED:  Regional decompression, L4-5.  Partial medial  hemifacetectomy, foraminotomies of L4-5.   ANESTHESIA:  General.   ASSISTANT:  Roma Schanz, P.A.   BRIEF HISTORY/INDICATIONS:  A 77 year old with refractory left lower  extremity radicular pain, L5 nerve root distribution.  MRI indicating  lateral recess stenosis and soft tissue in the lateral recess, possible  synovial cyst versus disk herniation.  Operative intervention was indicated  for decompression of the L5 root.  He had disk degeneration and previous  decompression at L4-5 that has been refractory to conservative treatment.  Partial relief from epidural steroid injections.  Risks and benefits have  discussed, including bleeding, infection, damage to vascular structures, CSF  leakage, epidural fibrosis, adjacent segment disease, need for fusion in the  future, anesthetic complications, no change in symptoms or worsening  symptoms.   TECHNIQUE:  With the patient in a supine position, after the induction of  adequate general anesthesia and 500 mg of vancomycin, he was placed prone on  the [place] frame.  All bony prominences were well padded.  The lumbar  region was prepped and draped in the usual sterile fashion.  Two 18 gauge  spinal needles were utilized to localize the L4-5 interspace and confirmed  with x-ray.  An incision was made over the appropriate level.  The previous  surgical wound was excised.  Subcutaneous tissue was  dissected, and  electrocautery was utilized to achieve hemostasis.  The dorsal lumbar fascia  was identified and divided in line with the skin incision.  Scar tissue was  encountered.  The paraspinous muscle was elevated from the lamina of L4-5.  A McCullough retractor was placed.  A second confirmatory radiograph  obtained with a Penfield 4 in the interlaminar space.  Noted after the  operating microscope was draped and brought into the surgical field was a  fairly small interlaminar window with secondary facet hypertrophy.  A  Leksell and a osteotome was utilized to remove a portion of the inferior  process of the facet at L4-5.  The medial border of the superior  articulating facet was then identified, and a 2 mm Kerrison was then  utilized to remove this portion up to the medial border of the pedicle to  perform a foraminotomy of L5.  There was significant compression into the  lateral recess.  We carried the hemilaminotomy to the caudad edge of 4.  Detached the ligamentum flavum.  Extensive epidural venous plexus was noted,  and bipolar electrocautery was utilized to achieve hemostasis.  The small  cyst from the L4-5 facet was excised.  The  foramen of L4 was narrowed by the  facet.  We traced the origin of the L4 root up proximally and followed it  out into a narrowed foramen.  Undercut the facet, protected the nerve at all  times.  There was a hardened disk at L4-5 not amenable to diskectomy.  Excellent decompression of an L5 root was noted.  We were able to pass a  hockey stick into the foramen of L4-5, found to be patent.  Good excursion  of the 5 root.  Checked beneath the thecal sac, to the axilla root and down  into the foramen.  No disk herniation was noted.  No evidence of CSF leakage  or active bleeding.  Electrocautery was utilized to achieve hemostasis.  We  then copiously irrigated.  Laminotomy.  Placed a thrombin-soaked Gelfoam  into the deep paraspinous muscle and  inspected after the [facility] removed.  Electrocautery utilized to achieve hemostasis.  We then repaired the dorsal  lumbar fascia with #1 Vicryl interrupted figure-of-eight sutures.  The  subcutaneous tissue was reapproximated with 2-0 Vicryl.  The skin was  reapproximated with staples.  The wound was dressed sterilely.  Placed  supine on the hospital bed.  Extubated without difficulty.  Transported to  the recovery room in satisfactory condition.   Patient tolerated the procedure well with no complications.      Jene Every, M.D.  Electronically Signed     JB/MEDQ  D:  09/14/2005  T:  09/14/2005  Job:  [id number]

## 2011-04-13 ENCOUNTER — Other Ambulatory Visit: Payer: Self-pay | Admitting: Family Medicine

## 2011-04-25 ENCOUNTER — Encounter: Payer: Medicare Other | Attending: Physical Medicine & Rehabilitation

## 2011-04-25 ENCOUNTER — Ambulatory Visit (HOSPITAL_BASED_OUTPATIENT_CLINIC_OR_DEPARTMENT_OTHER): Payer: Medicare Other | Admitting: Physical Medicine & Rehabilitation

## 2011-04-25 DIAGNOSIS — M75100 Unspecified rotator cuff tear or rupture of unspecified shoulder, not specified as traumatic: Secondary | ICD-10-CM | POA: Insufficient documentation

## 2011-04-25 DIAGNOSIS — IMO0002 Reserved for concepts with insufficient information to code with codable children: Secondary | ICD-10-CM | POA: Insufficient documentation

## 2011-04-25 DIAGNOSIS — M76899 Other specified enthesopathies of unspecified lower limb, excluding foot: Secondary | ICD-10-CM

## 2011-04-25 DIAGNOSIS — M25519 Pain in unspecified shoulder: Secondary | ICD-10-CM | POA: Insufficient documentation

## 2011-04-25 DIAGNOSIS — M79609 Pain in unspecified limb: Secondary | ICD-10-CM | POA: Insufficient documentation

## 2011-04-25 DIAGNOSIS — M542 Cervicalgia: Secondary | ICD-10-CM | POA: Insufficient documentation

## 2011-04-25 DIAGNOSIS — M48061 Spinal stenosis, lumbar region without neurogenic claudication: Secondary | ICD-10-CM

## 2011-04-25 DIAGNOSIS — IMO0001 Reserved for inherently not codable concepts without codable children: Secondary | ICD-10-CM | POA: Insufficient documentation

## 2011-04-25 NOTE — Progress Notes (Signed)
REASON FOR VISIT:  Eval for potential use of acupuncture for right greater than left shoulder pain, neck pain, and lower extremity pain.Marland Kitchen  HISTORY:  A 77 year old female with multiple pain issues.  She has a history of lumbar spinal stenosis as well as far lateral disc bulge in the left side.  She has had epidural injections per Dr. Ethelene Hal which has been helpful.  She has had right hip intra-articular injection.  She was seen by Dr. Shon Baton as well, who really did not think surgical intervention would be helpful.  She has had right L5 selective nerve root block on November 11, 2010.  She has been on narcotic analgesics including Percocet.  She is on Plavix.  She was not felt to be good spinal cord stimulators candidate.  Has trialed TENS in the past.  She had a left S1 transforaminal injection January 20, 2011 and left S1 transforaminal on March 24, 2011.  CURRENT MEDICATIONS:  Torsemide, pramipexole, glipizide, Plavix, oxycodone, Hyzaar, promethazine, cyclobenzaprine, dextroamphetamine. She states she has narcolepsy as well as tramadol.  ALLERGIES:  PENICILLIN, MYCINS, MORPHINE, LYRICA, METFORMIN, SULFA, CELEBREX, and ASPIRIN.  REVIEW OF SYSTEMS:  Also positive for right shoulder pain, pain is mainly with the reaching behind herself as well as overhead, has not been evaluated as mention this Dr. Ethelene Hal, who plan to follow up his next visit with her on this.  PAST HISTORY:  Diabetes, osteoarthritis.  SOCIAL HISTORY:  Widow, lives alone.  Her son is at the [facility]. She is financially still poor supporting him.  FAMILY HISTORY:  Heart disease, diabetes, hypertension.  PHYSICAL EXAMINATION:  Elderly female in no acute stress.  Mood and affect are appropriate.  She has positive impingement sign in the right shoulder.  She has tenderness over bilateral upper trapezius and tenderness in the lumbar paraspinals.  She also has tenderness over the left greater trochanter of the  hip.  Her gait shows no evidence of toe drag or knee instability, but she does have a widened base support.  She uses a cane to ambulate.  Spurling test is negative.  She does note some decreased sensation in the left C6 dermatomal distribution, otherwise sensation intact.  Range of motion is mildly diminished bilateral internal rotation of the hips.  IMPRESSION: 1. Multifactorial pain including bilateral trapezius myofascial pain     which I think would respond well to acupuncture. 2. Right subacromial bursitis.  She may benefit from subacromial bursa     injection plus therapy for this rather than acupuncture. 3. Chronic lumbosacral radiculopathy.  She has responded to epidural     steroid injections for this in the past likely this would be better     benefit than acupuncture at this point. 4. Left trochanteric bursitis I think she would benefit from troch     bursa injection rather than acupuncture for this.  Discussed with the patient was plan.  We will place some needles today see how she tolerates acupuncture if she wants to continue this on self pay basis given that her Medicare does not pay for this we can refer to acupuncturist here in town.  Discussed the patient agrees plan.   Acupuncture treatment bilateral GB21 as well as right LI15 and right TH8; 2 Hz stimulation test 20 minutes or times 30 minutes.  The patient tolerated the procedure well.  Postprocedure instructions given.     Erick Colace, M.D. Electronically Signed    AEK/MedQ D:  04/25/2011 12:50:22  T:  04/25/2011  17:00:11  Job #:  [id number]

## 2011-05-03 ENCOUNTER — Ambulatory Visit (INDEPENDENT_AMBULATORY_CARE_PROVIDER_SITE_OTHER): Payer: Medicare Other | Admitting: Family Medicine

## 2011-05-03 VITALS — BP 122/86 | HR 90 | Temp 98.6°F | Resp 16 | Wt 231.0 lb

## 2011-05-03 DIAGNOSIS — M7551 Bursitis of right shoulder: Secondary | ICD-10-CM

## 2011-05-03 DIAGNOSIS — T148 Other injury of unspecified body region: Secondary | ICD-10-CM

## 2011-05-03 DIAGNOSIS — E119 Type 2 diabetes mellitus without complications: Secondary | ICD-10-CM

## 2011-05-03 DIAGNOSIS — R609 Edema, unspecified: Secondary | ICD-10-CM

## 2011-05-03 DIAGNOSIS — W57XXXA Bitten or stung by nonvenomous insect and other nonvenomous arthropods, initial encounter: Secondary | ICD-10-CM

## 2011-05-03 DIAGNOSIS — M47812 Spondylosis without myelopathy or radiculopathy, cervical region: Secondary | ICD-10-CM

## 2011-05-03 DIAGNOSIS — M67919 Unspecified disorder of synovium and tendon, unspecified shoulder: Secondary | ICD-10-CM

## 2011-05-03 DIAGNOSIS — G8929 Other chronic pain: Secondary | ICD-10-CM

## 2011-05-03 DIAGNOSIS — T148XXA Other injury of unspecified body region, initial encounter: Secondary | ICD-10-CM

## 2011-05-03 DIAGNOSIS — M549 Dorsalgia, unspecified: Secondary | ICD-10-CM

## 2011-05-03 MED ORDER — POTASSIUM CHLORIDE CRYS ER 20 MEQ PO TBCR: 20.0000 meq | EXTENDED_RELEASE_TABLET | Freq: Two times a day (BID) | ORAL | Status: DC

## 2011-05-03 MED ORDER — METHYLPREDNISOLONE ACETATE 80 MG/ML IJ SUSP
160.0000 mg | Freq: Once | INTRAMUSCULAR | Status: AC
  Administered 2011-05-03: 160 mg via INTRAMUSCULAR

## 2011-05-03 NOTE — Patient Instructions (Signed)
The pain in the cervical spine is from degenerative arthritis causing radiation of pain into your shoulder and arm he also have subdeltoid bursitis in the right shoulder and the treatment is Depo-Medrol 180 mg IM and start the prednisone that Dr. Ethelene Hal prescribed also it would be advantageous to start the Cymbalta aids in treatment of the pain. Least call me the name of the neurosurgeon Alishas extension is [phone] High we'll check on the x-rays and at an MRIs done on you're back For the edema of your lower legs take one furosemide 80 mg 3 times daily I am going to send them a prescription for potassium and she will take twice daily Call or return in one week as to how you are responding to treatment

## 2011-05-04 ENCOUNTER — Other Ambulatory Visit: Payer: Self-pay

## 2011-05-04 MED ORDER — POTASSIUM CHLORIDE 10 MEQ PO TBCR: 10.0000 meq | EXTENDED_RELEASE_TABLET | Freq: Four times a day (QID) | ORAL | Status: DC

## 2011-05-05 ENCOUNTER — Encounter: Payer: Self-pay | Admitting: Family Medicine

## 2011-05-07 NOTE — Progress Notes (Signed)
  Subjective:    Patient ID: Melanie Mcdonald, female    DOB: [birth year], 77 y.o.   MRN: [id number] 76 year old white widow is in today complaining of the following. She has been seen in Dr. Ethelene Hal, and Dr. Jillyn Hidden regarding her back also she has been having pain in her right shoulder which Dr. Ethelene Hal considered the possibility of a torn rotator cuff ever he referred her back to me to help with a weight of the problem and the is been seen by to back orthopedist as well as Dr. Ethelene Hal they have decided that she needs to be seen by a neurosurgeon and is refer her to Dr. Tia Alert regarding the chronic back problem she has already had multiple surgeries on her back.  Her other complaint is chest considerable edema of both legs lower legs bilaterally has not responded to one furosemide 80 mg each day. The patient is also found a tick embedded in her right upper arm whichshe would like for me to remove. The patient also has cervical degenerative bone disease diagnosed in the past and is causing some pain now radiating to her right upper arm HPI    Review of Systems CHPI     Objective:   Physical Exam the patient is a well-built well-nourished obese white female who is pleasant and cooperative and in no distress HEENT not remarkable Lungs are clear palpation percussion and auscultation Heart examination not remarkable normal rate regular rhythm no murmurs Neck examination reveals marked tenderness laterally C2-C5 the pressure causes radiation of pain to the right shoulder Examination of the shoulder reveals tenderness subdeltoid and acromioclavicular bursal moderately no positive signs indicating rotator cuff tear Right upper arm reveals an embedded tick which I lanced area and remove entire tick 3+ pitting edema bilaterally lower extremities        Assessment & Plan:  Degenerative joint disease cervical spine as well as subdeltoid bursitis to treat with Depo-Medrol 180 mg her to continue pain  treatment per Dr. Ethelene Hal Tick bite removed take in its entirety after cleansing area and then dressing same Chronic back pain plans are for the patient to see a neurosurgeon Dr. Marikay Alar Peripheral edema to treat with furosemide 80 mg 3 times a day also add potassium, K. Dur 20 twice a day

## 2011-05-11 ENCOUNTER — Ambulatory Visit (INDEPENDENT_AMBULATORY_CARE_PROVIDER_SITE_OTHER): Payer: Medicare Other | Admitting: Family Medicine

## 2011-05-11 ENCOUNTER — Encounter: Payer: Self-pay | Admitting: Family Medicine

## 2011-05-11 VITALS — BP 138/90 | HR 106 | Temp 98.6°F | Wt 224.0 lb

## 2011-05-11 DIAGNOSIS — R609 Edema, unspecified: Secondary | ICD-10-CM

## 2011-05-11 DIAGNOSIS — E119 Type 2 diabetes mellitus without complications: Secondary | ICD-10-CM

## 2011-05-11 DIAGNOSIS — I2510 Atherosclerotic heart disease of native coronary artery without angina pectoris: Secondary | ICD-10-CM

## 2011-05-11 DIAGNOSIS — I10 Essential (primary) hypertension: Secondary | ICD-10-CM

## 2011-05-12 ENCOUNTER — Encounter: Payer: Self-pay | Admitting: Family Medicine

## 2011-05-12 NOTE — Patient Instructions (Addendum)
Going through your medicine list we have deleted Ambien Cymbalta prednisone and have updated your present medication Will notify you results of hemoglobin A1c today Continue medications that we have prescribed Plan is to attempt to over do physically so that you do not aggravate her chronic back problem Also schedule a physical examination of which we have not come complete lab studies on use and almost 2 years

## 2011-05-12 NOTE — Progress Notes (Signed)
  Subjective:    Patient ID: Melanie Mcdonald, female    DOB: Jan 20, 1933, 77 y.o.   MRN: [id number] This 77 year old white widowed is in for followup from her previous problems of marked edema both legs but especially the right with the area of cellulitis and she relates they have improved considerably since the last visit she was told to bring all of her medications in to make sure we have a properly ascend she is taking the correct medications today she did bring all of her medications and we will review them HPI she also relates over the past week her back is much better in fact greatly decreased in severity and she is considering deferring the neurosurgical consultation    Review of Systems CHPI no new complaints and has greatly improved    Objective:   Physical Exam  patient is a well-built well-nourished obese white female who appears to be in no distress and not complaining of pain today Examination lower extremities reveal no edema area of cellulitis of the right calf is completely healed        Assessment & Plan:  Edema and cellulitis much improved to continue furosemide 80 mg 3 times a day 2 continue potassium daily Diabetes mellitus to get a hemoglobin A1c today and discussed her treatment Hypertension well controlled continue same Chronic back pain continue to see Dr. Ethelene Hal for pain control

## 2011-05-25 ENCOUNTER — Encounter: Payer: Self-pay | Admitting: Family Medicine

## 2011-05-25 ENCOUNTER — Ambulatory Visit (INDEPENDENT_AMBULATORY_CARE_PROVIDER_SITE_OTHER): Payer: Medicare Other | Admitting: Family Medicine

## 2011-05-25 DIAGNOSIS — G8929 Other chronic pain: Secondary | ICD-10-CM

## 2011-05-25 DIAGNOSIS — I10 Essential (primary) hypertension: Secondary | ICD-10-CM

## 2011-05-25 DIAGNOSIS — E559 Vitamin D deficiency, unspecified: Secondary | ICD-10-CM

## 2011-05-25 DIAGNOSIS — I2510 Atherosclerotic heart disease of native coronary artery without angina pectoris: Secondary | ICD-10-CM

## 2011-05-25 DIAGNOSIS — M549 Dorsalgia, unspecified: Secondary | ICD-10-CM

## 2011-05-25 DIAGNOSIS — R079 Chest pain, unspecified: Secondary | ICD-10-CM

## 2011-05-25 DIAGNOSIS — M1990 Unspecified osteoarthritis, unspecified site: Secondary | ICD-10-CM

## 2011-05-25 DIAGNOSIS — E039 Hypothyroidism, unspecified: Secondary | ICD-10-CM

## 2011-05-25 DIAGNOSIS — E119 Type 2 diabetes mellitus without complications: Secondary | ICD-10-CM

## 2011-05-25 DIAGNOSIS — R350 Frequency of micturition: Secondary | ICD-10-CM

## 2011-05-25 DIAGNOSIS — E785 Hyperlipidemia, unspecified: Secondary | ICD-10-CM

## 2011-05-25 DIAGNOSIS — D649 Anemia, unspecified: Secondary | ICD-10-CM

## 2011-05-25 DIAGNOSIS — Z0000 Encounter for general adult medical examination without abnormal findings: Secondary | ICD-10-CM

## 2011-05-25 LAB — POCT URINALYSIS DIPSTICK
Bilirubin, UA: NEGATIVE
Blood, UA: NEGATIVE
Ketones, UA: NEGATIVE
Protein, UA: NEGATIVE
pH, UA: 7

## 2011-05-25 LAB — CBC WITH DIFFERENTIAL/PLATELET
Basophils Absolute: 0 10*3/uL (ref 0.0–0.1)
Eosinophils Absolute: 0.3 10*3/uL (ref 0.0–0.7)
HCT: 38.5 % (ref 36.0–46.0)
Hemoglobin: 12.6 g/dL (ref 12.0–15.0)
Lymphs Abs: 3.9 10*3/uL (ref 0.7–4.0)
MCHC: 32.9 g/dL (ref 30.0–36.0)
MCV: 90.1 fl (ref 78.0–100.0)
Neutro Abs: 5.6 10*3/uL (ref 1.4–7.7)
RDW: 14.8 % — ABNORMAL HIGH (ref 11.5–14.6)

## 2011-05-25 LAB — BASIC METABOLIC PANEL
BUN: 22 mg/dL (ref 6–23)
Chloride: 104 mEq/L (ref 96–112)
Glucose, Bld: 76 mg/dL (ref 70–99)
Potassium: 3.6 mEq/L (ref 3.5–5.1)

## 2011-05-25 LAB — HEPATIC FUNCTION PANEL
ALT: 16 U/L (ref 0–35)
AST: 17 U/L (ref 0–37)
Albumin: 3.7 g/dL (ref 3.5–5.2)
Alkaline Phosphatase: 114 U/L (ref 39–117)
Bilirubin, Direct: 0 mg/dL (ref 0.0–0.3)
Total Protein: 7.1 g/dL (ref 6.0–8.3)

## 2011-05-25 MED ORDER — INSULIN GLARGINE 100 UNIT/ML ~~LOC~~ SOLN: 100.0000 [IU] | Freq: Every day | SUBCUTANEOUS | Status: DC

## 2011-05-25 NOTE — Progress Notes (Signed)
  Subjective:    Patient ID: Melanie Mcdonald, female    DOB: 1933-09-01, 77 y.o.   MRN: [id number] This 77 year old white widow who has multiple medical problems and is today to discuss these and evaluate her medications and obtain necessary. laboratory studies. She relates she is doing rather well at present time her back multiple joint have improved since she received a Depo-Medrol injection over 8/1 however she continued to have stiffness and pain of almost all joints are chronic back pain and degenerative disc disease is treated by Dr. Ilean China and he controlles the pain medications. She relates her diabetes is better CBG this morning 94 there were over main complaint is she's having precordial chest pain and at times radiates into the neck, relieved with one to 2 nitroglycerin tablets patient has not had a cardiac evaluation and she was hospitalized 2 years ago and doesn't remember  who treated her in the hospital.  EKG reveals no acute ischemia but evidence of old MI Patient relates she has urinary urgency and at times cannot control urination but has stopped the vesicare and told to resume for. Patient has problem with nasal congestion left nostril with occasional bleeding, for congestion she uses Afrin periodically She does not use the dextroamphetamine for narcolepsy much this she did in the Continues to have restless leg syndrome and she doesn't take her Mirapex. Relates her depression is better control with Zoloft than with Cymbalta Continues to have peripheral edema he she doesn't take her furosemide  HPI    Review of Systems see history of present illness     Objective:   Physical Exam Vital signs are normal except for obesity the patient is a well-built well-nourished obese white female in no distress HEENT reveals a left nostril very congested with their area of dictation over to his site of bleeding. Ears are clear . Has dentures. Dry pulses are good thyroid nonpalpable Lungs clear  palpation percussion and auscultation no rales no wheezing no dullness Heart no murmurs heart sounds faint regular rhythm Abdomen liver spleen kidneys are nonpalpable and an obese abdomen  diaphysis present. Bowel sounds normal Pelvic and rectal not examined Extremities 1+ pretibial edema Neurological unsteady gait stiffness on getting out of chair and  pushing up to stand       Assessment & Plan:  Coronary artery disease referred to a cardiologist Dr. Shirlee Latch for evaluation and treatment Diabetes mellitus to check a hemoglobin A1c continue Lantus 100 units subcutaneously daily Restless legs continue Mirapex Narcolepsy use dextroamphetamine 1 needed Urinary urgency restart Vesicare 5 mg daily Chronic pain from degenerative disc disease and arthritis continue pain control Dr. Ethelene Hal Rhinitis left nostril saline irrigation daily and use Afrin periodically Depression continue Zoloft 100 mg daily

## 2011-05-25 NOTE — Patient Instructions (Signed)
In general I feel you're doing better as far as her arthritic pain and it appears that your diabetes is under control. Since intensity and frequency of the chest pain is more frequent.  need to see a cardiologist and have referred you to Dr. Shirlee Latch for evaluation and treatment Continue other medications as prescribed also resume treatment of vesicare

## 2011-06-02 ENCOUNTER — Telehealth: Payer: Self-pay | Admitting: Family Medicine

## 2011-06-02 NOTE — Telephone Encounter (Signed)
Pt called and had and ekg done and pt is wondering what was found on ekg, that would cause her to be referred to cardiologist? Pls call.

## 2011-06-02 NOTE — Telephone Encounter (Signed)
Per Dr. Scotty Court he suggest an appt with Cardiology so that pt can be evaluated. Pt is aware.

## 2011-06-07 NOTE — Progress Notes (Signed)
Addended by: Azucena Freed on: 06/07/2011 02:11 PM   Modules accepted: Orders

## 2011-06-07 NOTE — Progress Notes (Signed)
Quick Note:  Pt aware of lab results and will call to make an appt for hga1; lab orders have been entered. ______

## 2011-06-08 ENCOUNTER — Encounter: Payer: Self-pay | Admitting: Cardiology

## 2011-06-08 ENCOUNTER — Ambulatory Visit (INDEPENDENT_AMBULATORY_CARE_PROVIDER_SITE_OTHER): Payer: Medicare Other | Admitting: Cardiology

## 2011-06-08 VITALS — BP 167/84 | HR 91 | Resp 14 | Wt 238.0 lb

## 2011-06-08 DIAGNOSIS — I2510 Atherosclerotic heart disease of native coronary artery without angina pectoris: Secondary | ICD-10-CM

## 2011-06-08 DIAGNOSIS — R079 Chest pain, unspecified: Secondary | ICD-10-CM | POA: Insufficient documentation

## 2011-06-08 DIAGNOSIS — R072 Precordial pain: Secondary | ICD-10-CM

## 2011-06-08 DIAGNOSIS — I10 Essential (primary) hypertension: Secondary | ICD-10-CM

## 2011-06-08 NOTE — Assessment & Plan Note (Signed)
Blood pressure elevated. This will be followed and additional medications added as needed.

## 2011-06-08 NOTE — Assessment & Plan Note (Signed)
Symptoms atypical. Electrocardiogram suggests prior inferior infarct but it is unchanged compared to January of 2010. She had a cardiac catheterization in October of 2010 that showed no obstructive coronary disease. Given multiple risk factors plan to proceed with Lexiscan myoview. If negative no further workup.

## 2011-06-08 NOTE — Patient Instructions (Signed)
Your physician has requested that you have a lexiscan myoview. For further information please visit [email or web address]. Please follow instruction sheet, as given.   

## 2011-06-08 NOTE — Assessment & Plan Note (Signed)
Nonobstructive coronary disease at time of previous catheterization. Continue aspirin. Would consider statin in the future particularly given diabetes mellitus. We'll leave this to primary care.

## 2011-06-08 NOTE — Progress Notes (Signed)
HPI: 77 year old female I am asked to evaluate her chest pain. The patient had a Myoview July 2010 it showed an ejection fraction of 68% and no ischemia. She continued to have chest pain and had a cardiac catheterization in October 2010 that revealed:  Left main was short and normal.  The LAD had proximal luminal irregularities. First diagonal was normal.  Circumflex in the AV groove had luminal irregularities.  The mid obtuse marginal was large and normal.  The right coronary artery was large and dominant.  There was long proximal 30% stenosis.  PDA was large and normal. The EF was 65% with normal wall motion. Patient states that she has occasional chest pain. It is in the left chest area and described as a sharp stabbing pain. It radiates to her neck. It lasts 10 minutes and resolves spontaneously. It is not pleuritic, positional, exertional or related to food. It resolves with nitroglycerin. She also has dyspnea on exertion which is chronic. She also has pedal edema.     Current Outpatient Prescriptions  Medication Sig Dispense Refill  . clopidogrel (PLAVIX) 75 MG tablet Take 1 tablet (75 mg total) by mouth daily.  30 tablet  11  . cyclobenzaprine (FLEXERIL) 10 MG tablet Take 10 mg by mouth 3 (three) times daily as needed.        Marland Kitchen dextroamphetamine (DEXEDRINE SPANSULE) 10 MG 24 hr capsule Take 2 tablets each morning for narcolepsy.  Do not fill before 02/23/2011  60 capsule  0  . furosemide (LASIX) 80 MG tablet 3 tabs po qd      . glipiZIDE (GLUCOTROL XL) 10 MG 24 hr tablet TAKE 2 TABLETS BY MOUTH ONCE A DAY  60 tablet  5  . insulin glargine (LANTUS SOLOSTAR) 100 UNIT/ML injection Inject 100 Units into the skin daily.  18 mL  3  . losartan-hydrochlorothiazide (HYZAAR) 100-25 MG per tablet TAKE 1 TABLET BY MOUTH DAILY FOR BLOOD PRESSURE  30 tablet  5  . nitroGLYCERIN (NITROSTAT) 0.6 MG SL tablet Place 1 tablet (0.6 mg total) under the tongue every 5 (five) minutes as needed.  90 tablet  11  .  oxyCODONE-acetaminophen (PERCOCET) 10-325 MG per tablet Take 1 tablet by mouth every 6 (six) hours as needed.        . pramipexole (MIRAPEX) 1 MG tablet Take 1 tablet (1 mg total) by mouth 3 (three) times daily.  90 tablet  11  . promethazine (PHENERGAN) 25 MG tablet Take 25 mg by mouth every 6 (six) hours as needed.           Past Medical History  Diagnosis Date  . ANEMIA 01/30/2008  . ARTHRITIS 12/11/2007  . BACK PAIN, CHRONIC 06/23/2008  . BREAST CYST, RIGHT 12/17/2007  . CONTACT DERMATITIS 03/10/2009  . CORONARY ARTERY DISEASE 03/01/2007  . CYSTOCELE WITHOUT MENTION UTERINE PROLAPSE LAT 09/12/2010  . DEGENERATIVE JOINT DISEASE 08/07/2007  . DEPRESSION 03/01/2007  . DIABETES MELLITUS, TYPE II 08/26/2008  . Esophageal reflux 11/06/2007  . HYPERKERATOSIS 06/02/2009  . HYPERLIPIDEMIA 08/26/2008  . HYPERTENSION 03/01/2007  . Irritable bowel syndrome 01/26/2009  . LABYRINTHITIS 11/03/2009  . Narcolepsy without cataplexy 08/26/2008  . NEPHROLITHIASIS 04/29/2009  . OBESITY 03/01/2007  . PERIPHERAL VASCULAR DISEASE 01/26/2009  . SYNCOPE 08/26/2008  . TRANSIENT ISCHEMIC ATTACK 10/20/2009    Past Surgical History  Procedure Date  . Cataract extraction   . Abdominal hysterectomy   . Back surgery     x2  . Tonsilectomy, adenoidectomy, bilateral myringotomy and tubes   .  Cardiac catheterization 07/2009  . Spine surgery     History   Social History  . Marital Status: Widowed    Spouse Name: N/A    Number of Children: N/A  . Years of Education: N/A   Occupational History  . Not on file.   Social History Main Topics  . Smoking status: Never Smoker   . Smokeless tobacco: Never Used  . Alcohol Use: Not on file  . Drug Use: Yes    Special: Oxycodone  . Sexually Active: No   Other Topics Concern  . Not on file   Social History Narrative  . No narrative on file    ROS: chronic back pain but no fevers or chills, productive cough, hemoptysis, dysphasia, odynophagia, melena, hematochezia,  dysuria, hematuria, rash, seizure activity, orthopnea, PND,  claudication. Remaining systems are negative.  Physical Exam: Well-developed well-nourished in no acute distress.  Skin is warm and dry.  HEENT is normal.  Neck is supple. No thyromegaly.  Chest is clear to auscultation with normal expansion.  Cardiovascular exam is regular rate and rhythm. 1/6 as above ejection murmur. Abdominal exam nontender or distended. No masses palpated. Extremities show 1+ edema; varicosities noted. neuro grossly intact  ECG normal sinus rhythm at a rate of 85. Prior inferior infarct. Inferior Q waves were present in January of 2010

## 2011-06-12 ENCOUNTER — Encounter: Payer: Self-pay | Admitting: *Deleted

## 2011-06-21 ENCOUNTER — Other Ambulatory Visit (HOSPITAL_COMMUNITY): Payer: Medicare Other | Admitting: Radiology

## 2011-06-21 ENCOUNTER — Telehealth: Payer: Self-pay | Admitting: Family Medicine

## 2011-06-21 NOTE — Telephone Encounter (Signed)
Pt called and said that she has a sore throat and chest congestion. Can we please call something in? Dr. Clent Ridges did approve Keflex 500 mg take 1 po tid x 7 days # 21 and 0 refill. I did call in and pt aware.

## 2011-06-23 ENCOUNTER — Encounter: Payer: Self-pay | Admitting: Family Medicine

## 2011-06-23 ENCOUNTER — Ambulatory Visit (INDEPENDENT_AMBULATORY_CARE_PROVIDER_SITE_OTHER): Payer: Medicare Other | Admitting: Family Medicine

## 2011-06-23 VITALS — BP 138/96 | HR 95 | Temp 98.5°F | Wt 225.0 lb

## 2011-06-23 DIAGNOSIS — R0602 Shortness of breath: Secondary | ICD-10-CM

## 2011-06-23 DIAGNOSIS — R0789 Other chest pain: Secondary | ICD-10-CM

## 2011-06-23 DIAGNOSIS — R0989 Other specified symptoms and signs involving the circulatory and respiratory systems: Secondary | ICD-10-CM

## 2011-06-23 DIAGNOSIS — R05 Cough: Secondary | ICD-10-CM

## 2011-06-23 DIAGNOSIS — J40 Bronchitis, not specified as acute or chronic: Secondary | ICD-10-CM

## 2011-06-23 DIAGNOSIS — R0609 Other forms of dyspnea: Secondary | ICD-10-CM

## 2011-06-23 MED ORDER — ALBUTEROL SULFATE (2.5 MG/3ML) 0.083% IN NEBU
2.5000 mg | INHALATION_SOLUTION | RESPIRATORY_TRACT | Status: AC
  Administered 2011-06-23: 2.5 mg via RESPIRATORY_TRACT

## 2011-06-23 MED ORDER — IPRATROPIUM BROMIDE 0.02 % IN SOLN
0.5000 mg | RESPIRATORY_TRACT | Status: AC
  Administered 2011-06-23: 0.5 mg via RESPIRATORY_TRACT

## 2011-06-23 MED ORDER — HYDROCODONE-HOMATROPINE 5-1.5 MG/5ML PO SYRP: 5.0000 mL | ORAL_SOLUTION | ORAL | Status: AC | PRN

## 2011-06-23 MED ORDER — LEVOFLOXACIN 500 MG PO TABS: 500.0000 mg | ORAL_TABLET | Freq: Every day | ORAL | Status: AC

## 2011-06-23 MED ORDER — ALBUTEROL SULFATE HFA 108 (90 BASE) MCG/ACT IN AERS: 2.0000 | INHALATION_SPRAY | RESPIRATORY_TRACT | Status: DC | PRN

## 2011-06-23 NOTE — Progress Notes (Signed)
  Subjective:    Patient ID: Melanie Mcdonald, female    DOB: 1932/10/08, 77 y.o.   MRN: [id number]  HPI Here for one week of chest tightness, SOB, and coughing up yellow sputum. No fever. No chest pains, although she had some of these a few weeks ago. She saw Dr. Jens Som, and she has a stress test pending. Some nausea but no vomiting. She has been taking Keflex for a few days, but this is not helping.   Review of Systems  Constitutional: Positive for fatigue.  HENT: Positive for postnasal drip. Negative for congestion, sneezing and sinus pressure.   Eyes: Negative.   Respiratory: Positive for cough and shortness of breath.   Cardiovascular: Negative.        Objective:   Physical Exam  Constitutional:       Appears ill, walks with her cane   HENT:  Right Ear: External ear normal.  Left Ear: External ear normal.  Nose: Nose normal.  Mouth/Throat: Oropharynx is clear and moist.  Eyes: Conjunctivae are normal. Pupils are equal, round, and reactive to light.  Neck: No thyromegaly present.  Cardiovascular: Normal rate, regular rhythm, normal heart sounds and intact distal pulses.   Pulmonary/Chest: Effort normal. She has wheezes. She has no rales. She exhibits no tenderness.       Scattered rhonchi   Lymphadenopathy:    She has no cervical adenopathy.          Assessment & Plan:  Rest, fluids. Recheck prn

## 2011-06-26 ENCOUNTER — Other Ambulatory Visit (HOSPITAL_COMMUNITY): Payer: Medicare Other | Admitting: Radiology

## 2011-07-03 LAB — URINALYSIS, ROUTINE W REFLEX MICROSCOPIC
Bilirubin Urine: NEGATIVE
Nitrite: NEGATIVE
Specific Gravity, Urine: 1.023
Urobilinogen, UA: 1
pH: 6.5

## 2011-07-03 LAB — GLUCOSE, CAPILLARY
Glucose-Capillary: 122 — ABNORMAL HIGH
Glucose-Capillary: 134 — ABNORMAL HIGH
Glucose-Capillary: 135 — ABNORMAL HIGH
Glucose-Capillary: 140 — ABNORMAL HIGH

## 2011-07-03 LAB — HEPATIC FUNCTION PANEL
ALT: 17
AST: 18
Albumin: 3.3 — ABNORMAL LOW
Bilirubin, Direct: 0.1
Total Bilirubin: 0.7

## 2011-07-03 LAB — POCT I-STAT, CHEM 8
Creatinine, Ser: 0.6
HCT: 37
Hemoglobin: 12.6
Potassium: 3.6
Sodium: 141
TCO2: 28

## 2011-07-03 LAB — CBC
HCT: 37.1
MCV: 90.2
Platelets: 234
RDW: 14.7

## 2011-07-03 LAB — DIFFERENTIAL
Basophils Absolute: 0.2 — ABNORMAL HIGH
Basophils Relative: 2 — ABNORMAL HIGH
Eosinophils Absolute: 0.2
Eosinophils Relative: 2
Neutrophils Relative %: 65

## 2011-07-03 LAB — URINE MICROSCOPIC-ADD ON

## 2011-07-03 LAB — CK TOTAL AND CKMB (NOT AT ARMC)
CK, MB: 1.8
Total CK: 83

## 2011-07-03 LAB — POCT CARDIAC MARKERS
CKMB, poc: 1.6
Troponin i, poc: <0.05

## 2011-07-03 LAB — TROPONIN I: Troponin I: 0.01

## 2011-07-03 LAB — HEMOGLOBIN A1C: Mean Plasma Glucose: 212

## 2011-07-04 ENCOUNTER — Other Ambulatory Visit (HOSPITAL_COMMUNITY): Payer: Medicare Other | Admitting: Radiology

## 2011-07-09 ENCOUNTER — Other Ambulatory Visit: Payer: Self-pay | Admitting: Family Medicine

## 2011-07-12 ENCOUNTER — Ambulatory Visit (HOSPITAL_COMMUNITY): Payer: Medicare Other | Attending: Cardiology | Admitting: Radiology

## 2011-07-12 VITALS — Ht 67.0 in | Wt 228.0 lb

## 2011-07-12 DIAGNOSIS — I2510 Atherosclerotic heart disease of native coronary artery without angina pectoris: Secondary | ICD-10-CM

## 2011-07-12 DIAGNOSIS — R079 Chest pain, unspecified: Secondary | ICD-10-CM

## 2011-07-12 DIAGNOSIS — R072 Precordial pain: Secondary | ICD-10-CM | POA: Insufficient documentation

## 2011-07-12 DIAGNOSIS — R0989 Other specified symptoms and signs involving the circulatory and respiratory systems: Secondary | ICD-10-CM

## 2011-07-12 MED ORDER — REGADENOSON 0.4 MG/5ML IV SOLN
0.4000 mg | Freq: Once | INTRAVENOUS | Status: AC
  Administered 2011-07-12: 0.4 mg via INTRAVENOUS

## 2011-07-12 MED ORDER — TECHNETIUM TC 99M TETROFOSMIN IV KIT
33.0000 | PACK | Freq: Once | INTRAVENOUS | Status: AC | PRN
  Administered 2011-07-12: 33 via INTRAVENOUS

## 2011-07-12 MED ORDER — TECHNETIUM TC 99M TETROFOSMIN IV KIT
11.0000 | PACK | Freq: Once | INTRAVENOUS | Status: AC | PRN
  Administered 2011-07-12: 11 via INTRAVENOUS

## 2011-07-12 NOTE — Progress Notes (Signed)
[facility] SITE 3 NUCLEAR MED [address] [place] [address] [phone]  Cardiology Nuclear Med Study  Melanie Mcdonald is a 77 y.o. female [id number] 04-Apr-1933   Nuclear Med Background Indication for Stress Test:  Evaluation for Ischemia History:  '04 Echo:EF=55-65%; 6/10 MPS:No ischemia, EF=68%; 10/10 Cath:N/O CAD, EF=65% Cardiac Risk Factors: Family History - CAD, Hypertension, IDDM Type 2, Lipids, Obesity, and TIA  Symptoms:  Chest/Jaw and Neck Pain (last episode of chest discomfort was this a.m.; none now); Nausea and Chronic DOE   Nuclear Pre-Procedure Caffeine/Decaff Intake:  None NPO After: 5:00am Orange juice  Lungs:  Clear.  O2 SAT 97% on RA IV 0.9% NS with Angio Cath:  22g  IV Site: L Antecubital x 1, tolerated well  IV Started by:  Irean Hong, RN  Chest Size (in):  44 Cup Size: D  Height: 5\' 7"  (1.702 m)  Weight:  228 lb (103.42 kg)  BMI:  Body mass index is 35.71 kg/(m^2). Tech Comments:  FBS 60 at 5:00 am, drank juice with repeat BS 117 per patient.    Nuclear Med Study 1 or 2 day study: 1 day  Stress Test Type:  Eugenie Birks  Reading MD: Marca Ancona, MD  Order Authorizing Provider:  Olga Millers, MD  Resting Radionuclide: Technetium 47m Tetrofosmin  Resting Radionuclide Dose: 11 mCi   Stress Radionuclide:  Technetium 21m Tetrofosmin  Stress Radionuclide Dose: 33 mCi           Stress Protocol Rest HR: 80 Stress HR: 95  Rest BP: 136/63 Stress BP: 148/71  Exercise Time (min): n/a METS: n/a   Predicted Max HR: 145 bpm % Max HR: 65.52 bpm Rate Pressure Product: 78469   Dose of Adenosine (mg):  n/a Dose of Lexiscan: 0.4 mg  Dose of Atropine (mg): n/a Dose of Dobutamine: n/a mcg/kg/min (at max HR)  Stress Test Technologist: Smiley Houseman, CMA-N  Nuclear Technologist:  Domenic Polite, CNMT     Rest Procedure:  Myocardial perfusion imaging was performed at rest 45 minutes following the intravenous administration of Technetium  4m Tetrofosmin. Rest ECG: NSR - Normal EKG  Stress Procedure:  The patient received IV Lexiscan 0.4 mg over 15-seconds.  Technetium 58m Tetrofosmin injected at 30-seconds.  There were no significant changes with Lexiscan.  Quantitative spect images were obtained after a 45 minute delay. Stress ECG: No significant change from baseline ECG  QPS Raw Data Images:  "Hot spot" left breast, more pronounced on rest images.  Stress Images:  Normal homogeneous uptake in all areas of the myocardium. Rest Images:  Normal homogeneous uptake in all areas of the myocardium. Subtraction (SDS):  There is no evidence of scar or ischemia. Transient Ischemic Dilatation (Normal <1.22):  1.04 Lung/Heart Ratio (Normal <0.45):  .32  Quantitative Gated Spect Images QGS EDV:  83 ml QGS ESV:  31 ml QGS cine images:  NL LV Function; NL Wall Motion QGS EF: 63%  Impression Exercise Capacity:  Lexiscan with no exercise. BP Response:  Normal blood pressure response. Clinical Symptoms:  Short of breath, lightheaded ECG Impression:  No significant ST segment change suggestive of ischemia. Comparison with Prior Nuclear Study: No significant change from previous study  Overall Impression:  Normal stress nuclear study.  There is a "hot spot" near the surface of the left breast.  Patient should get mammogram if this has not been done recently.  Melanie Mcdonald [employer]

## 2011-07-14 ENCOUNTER — Encounter: Payer: Self-pay | Admitting: Family Medicine

## 2011-07-14 ENCOUNTER — Telehealth: Payer: Self-pay | Admitting: Cardiology

## 2011-07-14 ENCOUNTER — Telehealth: Payer: Self-pay | Admitting: Family Medicine

## 2011-07-14 ENCOUNTER — Ambulatory Visit (INDEPENDENT_AMBULATORY_CARE_PROVIDER_SITE_OTHER): Payer: Medicare Other | Admitting: Family Medicine

## 2011-07-14 VITALS — BP 154/96 | HR 110 | Temp 98.8°F | Wt 228.0 lb

## 2011-07-14 DIAGNOSIS — N6019 Diffuse cystic mastopathy of unspecified breast: Secondary | ICD-10-CM

## 2011-07-14 DIAGNOSIS — F329 Major depressive disorder, single episode, unspecified: Secondary | ICD-10-CM

## 2011-07-14 DIAGNOSIS — R6889 Other general symptoms and signs: Secondary | ICD-10-CM

## 2011-07-14 DIAGNOSIS — N6009 Solitary cyst of unspecified breast: Secondary | ICD-10-CM

## 2011-07-14 DIAGNOSIS — Z23 Encounter for immunization: Secondary | ICD-10-CM

## 2011-07-14 MED ORDER — SERTRALINE HCL 100 MG PO TABS: 100.0000 mg | ORAL_TABLET | Freq: Every day | ORAL | Status: DC

## 2011-07-14 MED ORDER — CIPROFLOXACIN HCL 500 MG PO TABS: 500.0000 mg | ORAL_TABLET | Freq: Two times a day (BID) | ORAL | Status: DC

## 2011-07-14 NOTE — Progress Notes (Signed)
  Subjective:    Patient ID: Melanie Mcdonald, female    DOB: [birth year], 77 y.o.   MRN: [id number]  HPI Here for med refills and to discuss a recent test result. She was seeing Dr. Jens Som for chest pains, and on 07-12-11 she had a Myoview stress test. Fortunately this was negative for any cardiac disease, but they did see a "hot spot" in the left breast. It was recommended that she get a mammogram of course. She has chronic breast tenderness, and it sounds like she has fibrocystic breasts. Because it is so painful for her to get a mammogram, she has not had one for at least 5 years by her reckoning. She is not aware of any particular symptoms in the left breast, no skin changes, no nipple DC, etc. She also wants to get back on Zoloft, after stopping it around 6 months ago. She has times where she gets depressed, and this recent news has greatly upset her.    Review of Systems  Constitutional: Negative.   Psychiatric/Behavioral: Positive for dysphoric mood. The patient is nervous/anxious.        Objective:   Physical Exam  Constitutional: She appears well-developed and well-nourished.  Genitourinary:       Both breasts  have diffuse lumpiness to them and are tender, consistent with fibrocystic disease. No dominant lumps are felt. No erythema or skin changes. No nipple DC. No axillary nodes           Assessment & Plan:  Recent area if increased uptake in the left breast. [facility]. Refilled her Zoloft.

## 2011-07-14 NOTE — Telephone Encounter (Signed)
Spoke with pt and gave her preliminary results of stress test as read by Dr.McLean. She was already aware of hot spot seen in left breast and has appt with Dr. Clent Ridges today to discuss getting mammogram.  I told her we would call her back if Dr. Jens Som had any additional recommendations. Per last office note no further cardiac work up needed if normal.  She will not schedule follow up at this time but will call as needed.

## 2011-07-14 NOTE — Telephone Encounter (Signed)
Pt had stress test done and is needing to get a mammogram asap because test showed a hot spot on lft breast. Pt is req to get a mammogram done at a location that has new equipment that does cause as much pain.

## 2011-07-14 NOTE — Telephone Encounter (Signed)
[facility] the [facility]. They will call and set up directly with pt.

## 2011-07-14 NOTE — Telephone Encounter (Signed)
Pt called about stress test results please call

## 2011-07-18 ENCOUNTER — Other Ambulatory Visit: Payer: Self-pay | Admitting: *Deleted

## 2011-07-18 ENCOUNTER — Ambulatory Visit
Admission: RE | Admit: 2011-07-18 | Discharge: 2011-07-18 | Disposition: A | Discharge status: Home / Self Care | Payer: Medicare Other | Source: Ambulatory Visit | Attending: Internal Medicine | Admitting: Internal Medicine

## 2011-07-18 DIAGNOSIS — N6009 Solitary cyst of unspecified breast: Secondary | ICD-10-CM

## 2011-07-18 DIAGNOSIS — N6019 Diffuse cystic mastopathy of unspecified breast: Secondary | ICD-10-CM

## 2011-07-19 ENCOUNTER — Telehealth: Payer: Self-pay | Admitting: Family Medicine

## 2011-07-19 DIAGNOSIS — Z139 Encounter for screening, unspecified: Secondary | ICD-10-CM

## 2011-07-19 NOTE — Telephone Encounter (Signed)
MS test ordered in computer.

## 2011-07-31 ENCOUNTER — Encounter: Payer: Self-pay | Admitting: Family Medicine

## 2011-07-31 ENCOUNTER — Ambulatory Visit (INDEPENDENT_AMBULATORY_CARE_PROVIDER_SITE_OTHER): Payer: Medicare Other | Admitting: Family Medicine

## 2011-07-31 VITALS — BP 134/82 | HR 109 | Temp 98.2°F

## 2011-07-31 DIAGNOSIS — F329 Major depressive disorder, single episode, unspecified: Secondary | ICD-10-CM

## 2011-07-31 MED ORDER — SERTRALINE HCL 100 MG PO TABS: 200.0000 mg | ORAL_TABLET | Freq: Every day | ORAL | Status: DC

## 2011-07-31 NOTE — Progress Notes (Signed)
  Subjective:    Patient ID: Melanie Mcdonald, female    DOB: 1933/06/22, 77 y.o.   MRN: [id number]  HPI Here to follow up on depression. She feels much better on Zoloft but would like to increase the dose. She sleeps well. She is still active in her church and stays busy.    Review of Systems  Constitutional: Negative.   Psychiatric/Behavioral: Positive for dysphoric mood.       Objective:   Physical Exam  Constitutional: She appears well-developed and well-nourished.  Psychiatric: She has a normal mood and affect. Her behavior is normal. Thought content normal.          Assessment & Plan:  Increase Zoloft to 200 mg a day. Recheck in 3 months

## 2011-08-02 ENCOUNTER — Emergency Department (HOSPITAL_COMMUNITY)
Admission: EM | Admit: 2011-08-02 | Discharge: 2011-08-02 | Disposition: A | Discharge status: Home / Self Care | Payer: Medicare Other | Attending: Emergency Medicine | Admitting: Emergency Medicine

## 2011-08-02 DIAGNOSIS — Z79899 Other long term (current) drug therapy: Secondary | ICD-10-CM | POA: Insufficient documentation

## 2011-08-02 DIAGNOSIS — M549 Dorsalgia, unspecified: Secondary | ICD-10-CM | POA: Insufficient documentation

## 2011-08-02 DIAGNOSIS — W010XXA Fall on same level from slipping, tripping and stumbling without subsequent striking against object, initial encounter: Secondary | ICD-10-CM | POA: Insufficient documentation

## 2011-08-02 DIAGNOSIS — M2550 Pain in unspecified joint: Secondary | ICD-10-CM | POA: Insufficient documentation

## 2011-08-02 DIAGNOSIS — R609 Edema, unspecified: Secondary | ICD-10-CM | POA: Insufficient documentation

## 2011-08-02 DIAGNOSIS — M25519 Pain in unspecified shoulder: Secondary | ICD-10-CM | POA: Insufficient documentation

## 2011-08-02 DIAGNOSIS — S81009A Unspecified open wound, unspecified knee, initial encounter: Secondary | ICD-10-CM | POA: Insufficient documentation

## 2011-08-02 DIAGNOSIS — Z7901 Long term (current) use of anticoagulants: Secondary | ICD-10-CM | POA: Insufficient documentation

## 2011-08-02 DIAGNOSIS — M25559 Pain in unspecified hip: Secondary | ICD-10-CM | POA: Insufficient documentation

## 2011-08-02 DIAGNOSIS — Z1833 Retained wood fragments: Secondary | ICD-10-CM | POA: Insufficient documentation

## 2011-08-02 DIAGNOSIS — G8929 Other chronic pain: Secondary | ICD-10-CM | POA: Insufficient documentation

## 2011-08-04 ENCOUNTER — Ambulatory Visit (INDEPENDENT_AMBULATORY_CARE_PROVIDER_SITE_OTHER): Payer: Medicare Other | Admitting: Family Medicine

## 2011-08-04 ENCOUNTER — Encounter: Payer: Self-pay | Admitting: Family Medicine

## 2011-08-04 VITALS — BP 142/80 | HR 91 | Temp 98.5°F

## 2011-08-04 DIAGNOSIS — F329 Major depressive disorder, single episode, unspecified: Secondary | ICD-10-CM

## 2011-08-04 DIAGNOSIS — S91009A Unspecified open wound, unspecified ankle, initial encounter: Secondary | ICD-10-CM

## 2011-08-04 DIAGNOSIS — S81009A Unspecified open wound, unspecified knee, initial encounter: Secondary | ICD-10-CM

## 2011-08-04 DIAGNOSIS — S81819A Laceration without foreign body, unspecified lower leg, initial encounter: Secondary | ICD-10-CM

## 2011-08-04 MED ORDER — CEFTRIAXONE SODIUM 1 G IJ SOLR
1.0000 g | INTRAMUSCULAR | Status: AC
  Administered 2011-08-04: 1 g via INTRAMUSCULAR

## 2011-08-04 MED ORDER — DULOXETINE HCL 60 MG PO CPEP: 60.0000 mg | ORAL_CAPSULE | Freq: Every day | ORAL | Status: DC

## 2011-08-04 NOTE — Progress Notes (Signed)
Addended by: Aniceto Boss A on: 08/04/2011 02:01 PM   Modules accepted: Orders
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
## 2011-08-07 ENCOUNTER — Ambulatory Visit (INDEPENDENT_AMBULATORY_CARE_PROVIDER_SITE_OTHER): Payer: Medicare Other | Admitting: Family Medicine

## 2011-08-07 ENCOUNTER — Encounter: Payer: Self-pay | Admitting: Family Medicine

## 2011-08-07 VITALS — BP 144/98 | HR 100 | Temp 98.5°F

## 2011-08-07 DIAGNOSIS — L0390 Cellulitis, unspecified: Secondary | ICD-10-CM

## 2011-08-07 DIAGNOSIS — S81819A Laceration without foreign body, unspecified lower leg, initial encounter: Secondary | ICD-10-CM

## 2011-08-07 DIAGNOSIS — S81809A Unspecified open wound, unspecified lower leg, initial encounter: Secondary | ICD-10-CM

## 2011-08-07 MED ORDER — LEVOFLOXACIN 500 MG PO TABS: 500.0000 mg | ORAL_TABLET | Freq: Every day | ORAL | Status: DC

## 2011-08-07 MED ORDER — CEFTRIAXONE SODIUM 1 G IJ SOLR
1.0000 g | INTRAMUSCULAR | Status: AC
  Administered 2011-08-07: 1 g via INTRAMUSCULAR

## 2011-08-07 NOTE — Progress Notes (Signed)
Addended by: Aniceto Boss A on: 08/07/2011 12:22 PM   Modules accepted: Orders

## 2011-08-07 NOTE — Progress Notes (Signed)
  Subjective:    Patient ID: Melanie Mcdonald, female    DOB: November 27, 1932, 77 y.o.   MRN: [id number]  HPI Here to recheck a laceration on the left lower leg. The area is slightly more red than before and is tender, although the drainage has greatly decreased. No fevers.    Review of Systems  Constitutional: Negative.   Skin: Positive for wound.       Objective:   Physical Exam  Constitutional: She appears well-developed and well-nourished.  Skin:       The laceration on the left lower leg is slowly closing in but the surrounding area is more erythematous than last week. It is warm and tender          Assessment & Plan:  Laceration with some cellulitis. Given another Rocephin shot, and we will switch from Keflex to levaquin. Recheck in a week

## 2011-08-10 ENCOUNTER — Encounter: Payer: Medicare Other | Admitting: Cardiology

## 2011-08-14 ENCOUNTER — Inpatient Hospital Stay (HOSPITAL_COMMUNITY)
Admission: EM | Admit: 2011-08-14 | Discharge: 2011-08-16 | DRG: 603 | Disposition: A | Discharge status: Home / Self Care | Payer: Medicare Other | Attending: Internal Medicine | Admitting: Internal Medicine

## 2011-08-14 ENCOUNTER — Encounter: Payer: Self-pay | Admitting: Family Medicine

## 2011-08-14 ENCOUNTER — Ambulatory Visit (INDEPENDENT_AMBULATORY_CARE_PROVIDER_SITE_OTHER): Payer: Medicare Other | Admitting: Family Medicine

## 2011-08-14 ENCOUNTER — Inpatient Hospital Stay (HOSPITAL_COMMUNITY): Payer: Medicare Other

## 2011-08-14 ENCOUNTER — Encounter (HOSPITAL_COMMUNITY): Payer: Self-pay | Admitting: *Deleted

## 2011-08-14 VITALS — BP 132/90 | HR 99 | Temp 98.5°F | Wt 238.0 lb

## 2011-08-14 DIAGNOSIS — L02419 Cutaneous abscess of limb, unspecified: Secondary | ICD-10-CM

## 2011-08-14 DIAGNOSIS — F329 Major depressive disorder, single episode, unspecified: Secondary | ICD-10-CM | POA: Diagnosis present

## 2011-08-14 DIAGNOSIS — G4700 Insomnia, unspecified: Secondary | ICD-10-CM

## 2011-08-14 DIAGNOSIS — R197 Diarrhea, unspecified: Secondary | ICD-10-CM | POA: Diagnosis not present

## 2011-08-14 DIAGNOSIS — I739 Peripheral vascular disease, unspecified: Secondary | ICD-10-CM | POA: Diagnosis present

## 2011-08-14 DIAGNOSIS — L03116 Cellulitis of left lower limb: Secondary | ICD-10-CM

## 2011-08-14 DIAGNOSIS — E876 Hypokalemia: Secondary | ICD-10-CM | POA: Diagnosis not present

## 2011-08-14 DIAGNOSIS — F3289 Other specified depressive episodes: Secondary | ICD-10-CM | POA: Diagnosis present

## 2011-08-14 DIAGNOSIS — Z8673 Personal history of transient ischemic attack (TIA), and cerebral infarction without residual deficits: Secondary | ICD-10-CM

## 2011-08-14 DIAGNOSIS — E119 Type 2 diabetes mellitus without complications: Secondary | ICD-10-CM | POA: Diagnosis present

## 2011-08-14 DIAGNOSIS — L0390 Cellulitis, unspecified: Secondary | ICD-10-CM

## 2011-08-14 DIAGNOSIS — I2510 Atherosclerotic heart disease of native coronary artery without angina pectoris: Secondary | ICD-10-CM | POA: Diagnosis present

## 2011-08-14 DIAGNOSIS — E785 Hyperlipidemia, unspecified: Secondary | ICD-10-CM | POA: Diagnosis present

## 2011-08-14 DIAGNOSIS — I10 Essential (primary) hypertension: Secondary | ICD-10-CM | POA: Diagnosis present

## 2011-08-14 DIAGNOSIS — K219 Gastro-esophageal reflux disease without esophagitis: Secondary | ICD-10-CM | POA: Diagnosis present

## 2011-08-14 DIAGNOSIS — F339 Major depressive disorder, recurrent, unspecified: Secondary | ICD-10-CM | POA: Diagnosis present

## 2011-08-14 HISTORY — DX: Cellulitis of left lower limb: L03.116

## 2011-08-14 LAB — CBC
HCT: 39.3 % (ref 36.0–46.0)
HCT: 36.1 % (ref 36.0–46.0)
Hemoglobin: 12.8 g/dL (ref 12.0–15.0)
Hemoglobin: 11.8 g/dL — ABNORMAL LOW (ref 12.0–15.0)
MCH: 29.2 pg (ref 26.0–34.0)
MCV: 89.5 fL (ref 78.0–100.0)
MCV: 89.1 fL (ref 78.0–100.0)
Platelets: 237 10*3/uL (ref 150–400)
RBC: 4.39 MIL/uL (ref 3.87–5.11)
RBC: 4.05 MIL/uL (ref 3.87–5.11)
WBC: 10.5 10*3/uL (ref 4.0–10.5)
WBC: 10.2 10*3/uL (ref 4.0–10.5)

## 2011-08-14 LAB — DIFFERENTIAL
Eosinophils Absolute: 0.2 10*3/uL (ref 0.0–0.7)
Eosinophils Relative: 2 % (ref 0–5)
Lymphocytes Relative: 24 % (ref 12–46)
Lymphs Abs: 2.4 10*3/uL (ref 0.7–4.0)
Monocytes Absolute: 0.6 10*3/uL (ref 0.1–1.0)
Monocytes Relative: 6 % (ref 3–12)

## 2011-08-14 LAB — BASIC METABOLIC PANEL
BUN: 14 mg/dL (ref 6–23)
CO2: 30 mEq/L (ref 19–32)
Calcium: 9 mg/dL (ref 8.4–10.5)
Creatinine, Ser: 0.61 mg/dL (ref 0.50–1.10)
GFR calc non Af Amer: 86 mL/min — ABNORMAL LOW (ref >90)
Glucose, Bld: 180 mg/dL — ABNORMAL HIGH (ref 70–99)

## 2011-08-14 LAB — GLUCOSE, CAPILLARY
Glucose-Capillary: 64 mg/dL — ABNORMAL LOW (ref 70–99)
Glucose-Capillary: 81 mg/dL (ref 70–99)

## 2011-08-14 LAB — CREATININE, SERUM
GFR calc Af Amer: >90 mL/min (ref >90)
GFR calc non Af Amer: 86 mL/min — ABNORMAL LOW (ref >90)

## 2011-08-14 MED ORDER — FUROSEMIDE 80 MG PO TABS
80.0000 mg | ORAL_TABLET | Freq: Two times a day (BID) | ORAL | Status: DC
  Administered 2011-08-15 – 2011-08-16 (×3): 80 mg via ORAL
  Filled 2011-08-14 (×5): qty 1

## 2011-08-14 MED ORDER — OXYCODONE-ACETAMINOPHEN 5-325 MG PO TABS
1.0000 | ORAL_TABLET | Freq: Four times a day (QID) | ORAL | Status: DC | PRN
  Administered 2011-08-15 (×2): 1 via ORAL
  Filled 2011-08-14 (×2): qty 1

## 2011-08-14 MED ORDER — OXYCODONE-ACETAMINOPHEN 10-325 MG PO TABS: 1.0000 | ORAL_TABLET | Freq: Four times a day (QID) | ORAL | Status: DC | PRN

## 2011-08-14 MED ORDER — SODIUM CHLORIDE 0.9 % IV SOLN
INTRAVENOUS | Status: AC
  Administered 2011-08-15: 10:00:00 via INTRAVENOUS

## 2011-08-14 MED ORDER — OXYCODONE HCL 5 MG PO TABS: 5.0000 mg | ORAL_TABLET | Freq: Four times a day (QID) | ORAL | Status: DC | PRN

## 2011-08-14 MED ORDER — MOXIFLOXACIN HCL IN NACL 400 MG/250ML IV SOLN
400.0000 mg | Freq: Once | INTRAVENOUS | Status: AC
  Administered 2011-08-14: 400 mg via INTRAVENOUS
  Filled 2011-08-14: qty 250

## 2011-08-14 MED ORDER — DEXTROAMPHETAMINE SULFATE ER 5 MG PO CP24: 10.0000 mg | ORAL_CAPSULE | Freq: Every day | ORAL | Status: DC | PRN

## 2011-08-14 MED ORDER — SENNOSIDES-DOCUSATE SODIUM 8.6-50 MG PO TABS: 1.0000 | ORAL_TABLET | Freq: Every day | ORAL | Status: DC | PRN

## 2011-08-14 MED ORDER — CLOPIDOGREL BISULFATE 75 MG PO TABS
75.0000 mg | ORAL_TABLET | Freq: Every day | ORAL | Status: DC
  Administered 2011-08-15 – 2011-08-16 (×2): 75 mg via ORAL
  Filled 2011-08-14 (×3): qty 1

## 2011-08-14 MED ORDER — PRAMIPEXOLE DIHYDROCHLORIDE 1 MG PO TABS
1.0000 mg | ORAL_TABLET | Freq: Three times a day (TID) | ORAL | Status: DC
  Administered 2011-08-14: 1 mg via ORAL
  Filled 2011-08-14 (×5): qty 1

## 2011-08-14 MED ORDER — INSULIN ASPART 100 UNIT/ML ~~LOC~~ SOLN
0.0000 [IU] | Freq: Three times a day (TID) | SUBCUTANEOUS | Status: DC
  Administered 2011-08-15 – 2011-08-16 (×3): 4 [IU] via SUBCUTANEOUS
  Filled 2011-08-14: qty 3

## 2011-08-14 MED ORDER — LOSARTAN POTASSIUM-HCTZ 100-25 MG PO TABS: 1.0000 | ORAL_TABLET | Freq: Every day | ORAL | Status: DC

## 2011-08-14 MED ORDER — OXYCODONE HCL 5 MG PO TABS: 5.0000 mg | ORAL_TABLET | ORAL | Status: DC | PRN

## 2011-08-14 MED ORDER — DICLOFENAC SODIUM 1 % TD GEL
1.0000 "application " | Freq: Every day | TRANSDERMAL | Status: DC
  Administered 2011-08-15 – 2011-08-16 (×2): 1 via TOPICAL
  Filled 2011-08-14: qty 100

## 2011-08-14 MED ORDER — ALBUTEROL SULFATE HFA 108 (90 BASE) MCG/ACT IN AERS
2.0000 | INHALATION_SPRAY | RESPIRATORY_TRACT | Status: DC | PRN
  Filled 2011-08-14: qty 6.7

## 2011-08-14 MED ORDER — SERTRALINE HCL 100 MG PO TABS
200.0000 mg | ORAL_TABLET | Freq: Every day | ORAL | Status: DC
  Administered 2011-08-15 – 2011-08-16 (×2): 200 mg via ORAL
  Filled 2011-08-14 (×3): qty 2

## 2011-08-14 MED ORDER — ACETAMINOPHEN 325 MG PO TABS: 650.0000 mg | ORAL_TABLET | Freq: Four times a day (QID) | ORAL | Status: DC | PRN

## 2011-08-14 MED ORDER — MORPHINE SULFATE 2 MG/ML IJ SOLN: 0.5000 mg | INTRAMUSCULAR | Status: DC | PRN

## 2011-08-14 MED ORDER — NITROGLYCERIN 0.4 MG SL SUBL: 0.4000 mg | SUBLINGUAL_TABLET | SUBLINGUAL | Status: DC | PRN

## 2011-08-14 MED ORDER — VANCOMYCIN HCL IN DEXTROSE 1-5 GM/200ML-% IV SOLN
1000.0000 mg | Freq: Two times a day (BID) | INTRAVENOUS | Status: DC
  Administered 2011-08-15 – 2011-08-16 (×3): 1000 mg via INTRAVENOUS
  Filled 2011-08-14 (×7): qty 200

## 2011-08-14 MED ORDER — ALUM & MAG HYDROXIDE-SIMETH 200-200-20 MG/5ML PO SUSP: 30.0000 mL | Freq: Four times a day (QID) | ORAL | Status: DC | PRN

## 2011-08-14 MED ORDER — PHENAZOPYRIDINE HCL 200 MG PO TABS
200.0000 mg | ORAL_TABLET | Freq: Three times a day (TID) | ORAL | Status: DC | PRN
  Filled 2011-08-14: qty 1

## 2011-08-14 MED ORDER — PANTOPRAZOLE SODIUM 40 MG PO TBEC
40.0000 mg | DELAYED_RELEASE_TABLET | Freq: Every day | ORAL | Status: DC
  Administered 2011-08-15 – 2011-08-16 (×2): 40 mg via ORAL
  Filled 2011-08-14 (×4): qty 1

## 2011-08-14 MED ORDER — INSULIN GLARGINE 100 UNIT/ML ~~LOC~~ SOLN
100.0000 [IU] | Freq: Every day | SUBCUTANEOUS | Status: DC
  Administered 2011-08-15 – 2011-08-16 (×3): 100 [IU] via SUBCUTANEOUS
  Filled 2011-08-14 (×2): qty 3

## 2011-08-14 MED ORDER — HYDROCHLOROTHIAZIDE 25 MG PO TABS
25.0000 mg | ORAL_TABLET | Freq: Every day | ORAL | Status: DC
  Administered 2011-08-15 – 2011-08-16 (×2): 25 mg via ORAL
  Filled 2011-08-14 (×3): qty 1

## 2011-08-14 MED ORDER — PROMETHAZINE HCL 25 MG PO TABS
25.0000 mg | ORAL_TABLET | Freq: Four times a day (QID) | ORAL | Status: DC | PRN
  Administered 2011-08-14: 25 mg via ORAL
  Filled 2011-08-14: qty 1

## 2011-08-14 MED ORDER — HYDRALAZINE HCL 20 MG/ML IJ SOLN
10.0000 mg | Freq: Four times a day (QID) | INTRAMUSCULAR | Status: DC | PRN
  Filled 2011-08-14: qty 0.5

## 2011-08-14 MED ORDER — LOSARTAN POTASSIUM 50 MG PO TABS
100.0000 mg | ORAL_TABLET | Freq: Every day | ORAL | Status: DC
  Administered 2011-08-15 – 2011-08-16 (×2): 100 mg via ORAL
  Filled 2011-08-14 (×3): qty 2

## 2011-08-14 MED ORDER — DULOXETINE HCL 60 MG PO CPEP
60.0000 mg | ORAL_CAPSULE | Freq: Every day | ORAL | Status: DC
  Administered 2011-08-15 – 2011-08-16 (×2): 60 mg via ORAL
  Filled 2011-08-14 (×3): qty 1

## 2011-08-14 MED ORDER — ENOXAPARIN SODIUM 40 MG/0.4ML ~~LOC~~ SOLN
40.0000 mg | SUBCUTANEOUS | Status: DC
  Administered 2011-08-14 – 2011-08-15 (×2): 40 mg via SUBCUTANEOUS
  Filled 2011-08-14 (×3): qty 0.4

## 2011-08-14 MED ORDER — LEVOFLOXACIN IN D5W 500 MG/100ML IV SOLN
500.0000 mg | INTRAVENOUS | Status: DC
  Administered 2011-08-14 – 2011-08-15 (×2): 500 mg via INTRAVENOUS
  Filled 2011-08-14 (×3): qty 100

## 2011-08-14 MED ORDER — ACETAMINOPHEN 650 MG RE SUPP: 650.0000 mg | Freq: Four times a day (QID) | RECTAL | Status: DC | PRN

## 2011-08-14 MED ORDER — CYCLOBENZAPRINE HCL 10 MG PO TABS
10.0000 mg | ORAL_TABLET | Freq: Three times a day (TID) | ORAL | Status: DC | PRN
  Filled 2011-08-14: qty 1

## 2011-08-14 NOTE — ED Notes (Signed)
Pt reports falling approx 2 weeks ago, cutting leg on a stick. Has open wound to lateral aspect of L leg. Seen by PCP and told to come here for possible IV abx. Yellow drainage present

## 2011-08-14 NOTE — ED Notes (Signed)
Pharm tech in room at this time. Will get labs when finished. RN aware

## 2011-08-14 NOTE — Progress Notes (Signed)
  Subjective:    Patient ID: ARBOR LEER, female    DOB: Jul 28, 1933, 77 y.o.   MRN: [id number]  HPI Here for cellulitis of the left lower leg. She has been given a shot of Rocephin and now a week of levaquin, but the leg is no better. It is still red and painful. No fever.    Review of Systems  Constitutional: Negative.   Skin: Positive for wound.       Objective:   Physical Exam  Constitutional: She appears well-developed and well-nourished.  Musculoskeletal:       The left lower leg wound looks about the same as last time. It is filled with granulation tissue, the area is red and warm and tender          Assessment & Plan:  The cellulitis is not responding to oral antibiotics. She needs IV antibiotics so she will drive from here directly to [facility] ER for probable admission.

## 2011-08-14 NOTE — ED Notes (Signed)
Upon assessment, pt found to need acute care room. Triage RN made aware. Will continue to monitor.

## 2011-08-14 NOTE — ED Notes (Signed)
Hospitalist said to not give hydralazine at this time.

## 2011-08-14 NOTE — ED Notes (Signed)
Receiving RN on 5E aware that pt is on her way to floor. Report was given by previous RN, Pam.
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
## 2011-08-14 NOTE — ED Provider Notes (Signed)
History     CSN: [id number] Arrival date & time: 08/14/2011  2:40 PM   First MD Initiated Contact with Patient 08/14/11 1541      Chief Complaint  Patient presents with  . Leg Pain    (Consider location/radiation/quality/duration/timing/severity/associated sxs/prior treatment) HPI The patient states 2 weeks ago she fell and cut her leg deeply on a stick. Was on the lateral aspect of her left leg. Patient was seen in the emergency room and had the wound irrigated thoroughly but was left open to heal by secondary intention due to the fact the wound appeared contaminated. Patient has been following up with her doctor and had been given a shot of Rocephin as well as a week of Levaquin. She saw her doctor this morning and the wound continues to look red and has been tender. It has been draining a lot of fluid as well. Dr. Clent Ridges examined her and felt that she needed to be admitted to the hospital and he instructed her to come to the emergency department. I have been able to review his note from this morning. Patient states the area is sore. It seems to be confined to the leg at this time. The wound tenderness increases with palpation and movement. She's not had any fevers or vomiting. Past Medical History  Diagnosis Date  . ANEMIA 01/30/2008  . ARTHRITIS 12/11/2007  . BACK PAIN, CHRONIC 06/23/2008  . BREAST CYST, RIGHT 12/17/2007  . CONTACT DERMATITIS 03/10/2009  . CORONARY ARTERY DISEASE 03/01/2007  . CYSTOCELE WITHOUT MENTION UTERINE PROLAPSE LAT 09/12/2010  . DEGENERATIVE JOINT DISEASE 08/07/2007  . DEPRESSION 03/01/2007  . DIABETES MELLITUS, TYPE II 08/26/2008  . Esophageal reflux 11/06/2007  . HYPERKERATOSIS 06/02/2009  . HYPERLIPIDEMIA 08/26/2008  . HYPERTENSION 03/01/2007  . Irritable bowel syndrome 01/26/2009  . LABYRINTHITIS 11/03/2009  . Narcolepsy without cataplexy 08/26/2008  . NEPHROLITHIASIS 04/29/2009  . OBESITY 03/01/2007  . PERIPHERAL VASCULAR DISEASE 01/26/2009  . SYNCOPE 08/26/2008  .  TRANSIENT ISCHEMIC ATTACK 10/20/2009    Past Surgical History  Procedure Date  . Cataract extraction   . Abdominal hysterectomy   . Back surgery     x2  . Tonsilectomy, adenoidectomy, bilateral myringotomy and tubes   . Cardiac catheterization 07/2009  . Spine surgery     No family history on file.  History  Substance Use Topics  . Smoking status: Never Smoker   . Smokeless tobacco: Never Used  . Alcohol Use: No    OB History    Grav Para Term Preterm Abortions TAB SAB Ect Mult Living                  Review of Systems  All other systems reviewed and are negative.    Allergies  Erythromycin; Penicillins; and Sulfonamide derivatives  Home Medications   Current Outpatient Rx  Name Route Sig Dispense Refill  . ALBUTEROL SULFATE HFA 108 (90 BASE) MCG/ACT IN AERS Inhalation Inhale 2 puffs into the lungs every 4 (four) hours as needed for wheezing or shortness of breath. 1 Inhaler 5  . CLOPIDOGREL BISULFATE 75 MG PO TABS Oral Take 1 tablet (75 mg total) by mouth daily. 30 tablet 11  . CYCLOBENZAPRINE HCL 10 MG PO TABS Oral Take 10 mg by mouth 3 (three) times daily as needed. Muscle spasm    . DEXTROAMPHETAMINE SULFATE 10 MG PO CP24 Oral Take by mouth daily as needed. So will stay awake     . DICLOFENAC SODIUM 1 % TD GEL Topical  Apply 1 application topically daily. pain    . FUROSEMIDE 80 MG PO TABS Oral Take 80 mg by mouth 2 (two) times daily. 3 tabs po qd    . GLIPIZIDE ER 10 MG PO TB24       . INSULIN GLARGINE 100 UNIT/ML [place] SOLN Subcutaneous Inject 100 Units into the skin daily. 18 mL 3  . LEVOFLOXACIN 500 MG PO TABS Oral Take 500 mg by mouth daily.      Marland Kitchen LOSARTAN POTASSIUM-HCTZ 100-25 MG PO TABS  TAKE 1 TABLET BY MOUTH DAILY FOR BLOOD PRESSURE 30 tablet 5  . NITROGLYCERIN 0.6 MG SL SUBL Sublingual Place 1 tablet (0.6 mg total) under the tongue every 5 (five) minutes as needed. 90 tablet 11  . OXYCODONE-ACETAMINOPHEN 10-325 MG PO TABS Oral Take 1 tablet by mouth every  6 (six) hours as needed. pain    . PHENAZOPYRIDINE HCL 200 MG PO TABS Oral Take 200 mg by mouth 3 (three) times daily as needed. pain    . PRAMIPEXOLE DIHYDROCHLORIDE 1 MG PO TABS Oral Take 1 tablet (1 mg total) by mouth 3 (three) times daily. 90 tablet 11  . PROMETHAZINE HCL 25 MG PO TABS Oral Take 25 mg by mouth every 6 (six) hours as needed. nausea    . DULOXETINE HCL 60 MG PO CPEP Oral Take 1 capsule (60 mg total) by mouth daily. 28 capsule 0  . SERTRALINE HCL 100 MG PO TABS Oral Take 200 mg by mouth daily.        BP 183/80  Pulse 102  Temp 98.2 F (36.8 C)  Resp 18  SpO2 98%  Physical Exam  Nursing note and vitals reviewed. Constitutional: She appears well-developed and well-nourished. No distress.  HENT:  Head: Normocephalic and atraumatic.  Right Ear: External ear normal.  Left Ear: External ear normal.  Eyes: Conjunctivae are normal. Right eye exhibits no discharge. Left eye exhibits no discharge. No scleral icterus.  Neck: Neck supple. No tracheal deviation present.  Cardiovascular: Normal rate, regular rhythm and intact distal pulses.   Pulmonary/Chest: Effort normal and breath sounds normal. No stridor. No respiratory distress. She has no wheezes. She has no rales.  Abdominal: She exhibits no distension.  Musculoskeletal: She exhibits edema and tenderness.       Legs:      Laceration lateral aspect left leg, face wound with yellow fatty tissue, no purulent drainage at this time, tenderness to palpation around the wound, erythema around the wound confined to the left shin and calf area, no lymphangitic streaking  Neurological: She is alert. She has normal strength. No sensory deficit. Cranial nerve deficit:  no gross defecits noted. She exhibits normal muscle tone. She displays no seizure activity. Coordination normal.  Skin: Skin is warm and dry. No rash noted.  Psychiatric: She has a normal mood and affect.    ED Course  Procedures (including critical care  time)  Labs Reviewed  BASIC METABOLIC PANEL - Abnormal; Notable for the following:    Glucose, Bld 180 (*)    GFR calc non Af Amer 86 (*)    All other components within normal limits  CBC  DIFFERENTIAL   No results found.   1. Cellulitis       MDM  The patient has a cellulitis that has not been responding to oral antibiotics. Her primary care felt  she needed to be admitted to the hospital. I have consulted with the triad hospitalist. She'll be admitted for IV antibiotics.  Celene Kras, MD 08/14/11 1754

## 2011-08-14 NOTE — Progress Notes (Signed)
ANTIBIOTIC CONSULT NOTE - INITIAL  Pharmacy Consult for Vancomycin Indication: Cellulits  Allergies  Allergen Reactions  . Erythromycin Nausea And Vomiting  . Penicillins Swelling  . Sulfonamide Derivatives Nausea And Vomiting    Patient Measurements: Height: 5\' 8"  (172.7 cm) Weight: 230 lb (104.327 kg) IBW/kg (Calculated) : 63.9    Vital Signs: Temp: 98.5 F (36.9 C) (11/12 1955) Temp src: Oral (11/12 1955) BP: 163/80 mmHg (11/12 1955) Pulse Rate: 90  (11/12 1955) Intake/Output from previous day:   Intake/Output from this shift:    Labs:  [facility] 08/14/11 2107 08/14/11 1645  WBC 10.2 10.5  HGB 11.8* 12.8  PLT 215 237  LABCREA -- --  CREATININE 0.61 0.61   Estimated Creatinine Clearance: 75.7 ml/min (by C-G formula based on Cr of 0.61). (Normalized CrCl~5ml/min/1.73m2)    Microbiology: No results found for this or any previous visit (from the past 720 hour(s)).  Medical History: Past Medical History  Diagnosis Date  . ANEMIA 01/30/2008  . ARTHRITIS 12/11/2007  . BACK PAIN, CHRONIC 06/23/2008  . BREAST CYST, RIGHT 12/17/2007  . CONTACT DERMATITIS 03/10/2009  . CORONARY ARTERY DISEASE 03/01/2007  . CYSTOCELE WITHOUT MENTION UTERINE PROLAPSE LAT 09/12/2010  . DEGENERATIVE JOINT DISEASE 08/07/2007  . DEPRESSION 03/01/2007  . DIABETES MELLITUS, TYPE II 08/26/2008  . Esophageal reflux 11/06/2007  . HYPERKERATOSIS 06/02/2009  . HYPERLIPIDEMIA 08/26/2008  . HYPERTENSION 03/01/2007  . Irritable bowel syndrome 01/26/2009  . LABYRINTHITIS 11/03/2009  . Narcolepsy without cataplexy 08/26/2008  . OBESITY 03/01/2007  . PERIPHERAL VASCULAR DISEASE 01/26/2009  . SYNCOPE 08/26/2008  . TRANSIENT ISCHEMIC ATTACK 10/20/2009  . NEPHROLITHIASIS 04/29/2009    Medications:  Scheduled:    . clopidogrel  75 mg Oral Daily  . diclofenac sodium  1 application Topical Daily  . DULoxetine  60 mg Oral Daily  . enoxaparin  40 mg Subcutaneous Q24H  . furosemide  80 mg Oral BID  .  hydrochlorothiazide  25 mg Oral Daily  . insulin aspart  0-20 Units Subcutaneous TID WC  . insulin glargine  100 Units Subcutaneous Daily  . levofloxacin (LEVAQUIN) IV  500 mg Intravenous Q24H  . losartan  100 mg Oral Daily  . moxifloxacin  400 mg Intravenous Once  . pantoprazole  40 mg Oral Q0600  . pramipexole  1 mg Oral TID  . sertraline  200 mg Oral Daily  . DISCONTD: losartan-hydrochlorothiazide  1 tablet Oral Daily   Assessment: 77 yo F w/LLE cellulitis. Pharmacy to dose Vancomycin. Has also been ordered Levaquin.  Goal of Therapy:  Vancomycin trough level 10-15 mcg/ml  Plan:  Vancomycin 1g IV q12h. Follow labs, vitals and cultures. Vancomycin trough at steady state. Adjust dose as appropriate. Duration of therapy per MD.  Gwen Her 08/14/2011,10:07 PM

## 2011-08-14 NOTE — ED Notes (Signed)
Pt requests labs be drawn when IV is started due to limited access. Triage RN and PA aware.

## 2011-08-15 DIAGNOSIS — R197 Diarrhea, unspecified: Secondary | ICD-10-CM | POA: Diagnosis not present

## 2011-08-15 DIAGNOSIS — E876 Hypokalemia: Secondary | ICD-10-CM | POA: Diagnosis not present

## 2011-08-15 HISTORY — DX: Hypokalemia: E87.6

## 2011-08-15 HISTORY — DX: Diarrhea, unspecified: R19.7

## 2011-08-15 LAB — GLUCOSE, CAPILLARY
Glucose-Capillary: 178 mg/dL — ABNORMAL HIGH (ref 70–99)
Glucose-Capillary: 186 mg/dL — ABNORMAL HIGH (ref 70–99)
Glucose-Capillary: 87 mg/dL (ref 70–99)

## 2011-08-15 LAB — BASIC METABOLIC PANEL
Calcium: 9 mg/dL (ref 8.4–10.5)
GFR calc Af Amer: >90 mL/min (ref >90)
GFR calc non Af Amer: 84 mL/min — ABNORMAL LOW (ref >90)
Glucose, Bld: 157 mg/dL — ABNORMAL HIGH (ref 70–99)
Sodium: 138 mEq/L (ref 135–145)

## 2011-08-15 LAB — HEMOGLOBIN A1C
Hgb A1c MFr Bld: 6.8 % — ABNORMAL HIGH (ref <5.7)
Mean Plasma Glucose: 148 mg/dL — ABNORMAL HIGH (ref <117)

## 2011-08-15 LAB — CBC
MCH: 29.2 pg (ref 26.0–34.0)
Platelets: 227 10*3/uL (ref 150–400)
RBC: 4.15 MIL/uL (ref 3.87–5.11)
WBC: 9.6 10*3/uL (ref 4.0–10.5)

## 2011-08-15 LAB — MRSA PCR SCREENING: MRSA by PCR: NEGATIVE

## 2011-08-15 MED ORDER — TRAZODONE HCL 50 MG PO TABS
50.0000 mg | ORAL_TABLET | Freq: Every evening | ORAL | Status: DC | PRN
  Administered 2011-08-15: 50 mg via ORAL
  Filled 2011-08-15: qty 2

## 2011-08-15 MED ORDER — PRAMIPEXOLE DIHYDROCHLORIDE 1.5 MG PO TABS
3.0000 mg | ORAL_TABLET | Freq: Every evening | ORAL | Status: DC | PRN
  Administered 2011-08-15: 3 mg via ORAL
  Administered 2011-08-15: 2 mg via ORAL
  Filled 2011-08-15: qty 2

## 2011-08-15 MED ORDER — POTASSIUM CHLORIDE CRYS ER 20 MEQ PO TBCR
40.0000 meq | EXTENDED_RELEASE_TABLET | Freq: Once | ORAL | Status: AC
  Administered 2011-08-15: 40 meq via ORAL
  Filled 2011-08-15: qty 2

## 2011-08-15 NOTE — Consult Note (Signed)
WOC consult Note Reason for Consult: Requested to eval pt for wound care to LLE.  Pt has trauma wound sustained after fall outside at home a week or so back.    Wound type: trauma with associated cellulitis  Measurement: 7cm x 1.0cm x 0.2cm few other scattered linear scabbed areas  Wound bed: filled with eschar, no open wound bed present at current time of assessment  Drainage (amount, consistency, odor) no drainage present at time of assessment  Periwound: with surround ertheyma, marked by skin marker on skin, appears to be improving per current markings on skin and per pt report  Dressing procedure/placement/frequency:  Will order saline gel to be applied M/W/F to soften wound bed tissue and provide moist wound healing and cover with silicone foam dressing to protect area from further trauma as well silicone will be less aggressive to skin for easy removal for assessment.   Re consult if needed, will not follow at this time. Thanks  Jeroline Wolbert [employer], CWOCN [phone])

## 2011-08-15 NOTE — Progress Notes (Signed)
[id number]/Griselda Bramblett, RN, BSN, CCM/CHART REVIEW FOR UR PERFORMED. 

## 2011-08-15 NOTE — Progress Notes (Signed)
Subjective: Patient states that left lower extremity is improved. Patient is complaining of very loose watery stools. No other complaints.  Objective: Vital signs in last 24 hours: Filed Vitals:   08/14/11 1955 08/14/11 2040 08/14/11 2145 08/15/11 0610  BP: 163/80 133/83  119/71  Pulse: 90 81  77  Temp: 98.5 F (36.9 C) 98.2 F (36.8 C)  97.8 F (36.6 C)  TempSrc: Oral Oral  Oral  Resp:  18    Height:   5\' 8"  (1.727 m)   Weight:   104.327 kg (230 lb)   SpO2: 97% 97%  96%    Intake/Output Summary (Last 24 hours) at 08/15/11 0942 Last data filed at 08/15/11 0843  Gross per 24 hour  Intake   1120 ml  Output   1150 ml  Net    -30 ml    Weight change:   General: Alert, awake, oriented x3, in no acute distress. HEENT: No bruits, no goiter. Heart: Regular rate and rhythm, without murmurs, rubs, gallops. Lungs: Clear to auscultation bilaterally. Abdomen: Soft, nontender, nondistended, positive bowel sounds, Reducible abdominal hernia. Extremities: No clubbing cyanosis. LLE with decreased erythema, decreased edema, TTP. Laceration on lateral aspect without drainage. Neuro: Grossly intact, nonfocal.   Lab Results:  Basename 08/15/11 0415 08/14/11 2107 08/14/11 1645  NA 138 -- 140  K 3.4* -- 3.7  CL 102 -- 103  CO2 28 -- 30  GLUCOSE 157* -- 180*  BUN 12 -- 14  CREATININE 0.65 0.61 --  CALCIUM 9.0 -- 9.0  MG -- -- --  PHOS -- -- --   No results found for this basename: AST:2,ALT:2,ALKPHOS:2,BILITOT:2,PROT:2,ALBUMIN:2 in the last 72 hours No results found for this basename: LIPASE:2,AMYLASE:2 in the last 72 hours  Basename 08/15/11 0415 08/14/11 2107 08/14/11 1645  WBC 9.6 10.2 --  NEUTROABS -- -- 7.1  HGB 12.1 11.8* --  HCT 37.1 36.1 --  MCV 89.4 89.1 --  PLT 227 215 --   No results found for this basename: CKTOTAL:3,CKMB:3,CKMBINDEX:3,TROPONINI:3 in the last 72 hours No results found for this basename: POCBNP:3 in the last 72 hours No results found for this  basename: DDIMER:2 in the last 72 hours  Basename 08/14/11 2107  HGBA1C 6.8*   No results found for this basename: CHOL:2,HDL:2,LDLCALC:2,TRIG:2,CHOLHDL:2,LDLDIRECT:2 in the last 72 hours No results found for this basename: TSH,T4TOTAL,FREET3,T3FREE,THYROIDAB in the last 72 hours No results found for this basename: VITAMINB12:2,FOLATE:2,FERRITIN:2,TIBC:2,IRON:2,RETICCTPCT:2 in the last 72 hours  Micro Results: No results found for this or any previous visit (from the past 240 hour(s)).  Studies/Results: Dg Tibia/fibula Left  08/14/2011  *RADIOLOGY REPORT*  Clinical Data: 77 year old female with left lower leg redness and swelling, possible abscess.  LEFT TIBIA AND FIBULA - 2 VIEW  Comparison: None.  Findings: Soft tissue swelling is evident in the left lower extremity. Bone mineralization is within normal limits for age. Grossly normal alignment at the left knee and ankle.  Left tibia and fibula intact.  No osteolysis identified.  No evidence of ankle joint effusion.  IMPRESSION: Soft tissue swelling without plain radiographic evidence of acute osseous abnormality in the left tib-fib.  Original Report Authenticated By: Harley Hallmark, M.D.    Medications:     . clopidogrel  75 mg Oral Daily  . diclofenac sodium  1 application Topical Daily  . DULoxetine  60 mg Oral Daily  . enoxaparin  40 mg Subcutaneous Q24H  . furosemide  80 mg Oral BID  . hydrochlorothiazide  25 mg Oral Daily  .  insulin aspart  0-20 Units Subcutaneous TID WC  . insulin glargine  100 Units Subcutaneous Daily  . levofloxacin (LEVAQUIN) IV  500 mg Intravenous Q24H  . losartan  100 mg Oral Daily  . moxifloxacin  400 mg Intravenous Once  . pantoprazole  40 mg Oral Q0600  . potassium chloride  40 mEq Oral Once  . sertraline  200 mg Oral Daily  . vancomycin  1,000 mg Intravenous Q12H  . DISCONTD: losartan-hydrochlorothiazide  1 tablet Oral Daily  . DISCONTD: pramipexole  1 mg Oral TID    Assessment/Plan Principal  Problem:  *Cellulitis of left leg   Diarrhea Active Problems:  DIABETES MELLITUS, TYPE II  DEPRESSION  HYPERTENSION  Esophageal reflux  Hypokalemia  1. LLE Cellulitis / wound- Clinical improvement. Wound care consult pending. LLE xray with no osseos abnormalities. Continue IV vancomycin and Levaquin as patient is a diabetic. We'll keep left lower extremity elevated. Pain management. Once patient medically stable for discharge may consider oral doxycycline or keflex (patient states tolerates). Follow.  2.Diarrhea - Patient has been on antibiotics for past 2 weeks. Will check a cdiff PCR. If cdiff neg will try immodium PRN. 3. well-controlled Type 2 diabetes type -  hemoglobin A1c = 6.8. Continue  home dose of Lantus. Hold oral hypoglycemics. SSI  4. hypertension- continue home regimen.  5. Depression- continue home regimen per  6. gastroesophageal reflux disease-proton pump inhibitor.  7. prophylaxis-PPI for GI, Lovenox for DVT.     LOS: 1 day   Doris Miller [facility] 08/15/2011, 9:42 AM

## 2011-08-16 LAB — BASIC METABOLIC PANEL
CO2: 32 mEq/L (ref 19–32)
Chloride: 101 mEq/L (ref 96–112)
Creatinine, Ser: 0.8 mg/dL (ref 0.50–1.10)
GFR calc Af Amer: 81 mL/min — ABNORMAL LOW (ref >90)
Potassium: 3.1 mEq/L — ABNORMAL LOW (ref 3.5–5.1)
Sodium: 139 mEq/L (ref 135–145)

## 2011-08-16 LAB — CBC
HCT: 38.7 % (ref 36.0–46.0)
Hemoglobin: 12.8 g/dL (ref 12.0–15.0)
MCV: 88.6 fL (ref 78.0–100.0)
RBC: 4.37 MIL/uL (ref 3.87–5.11)
RDW: 14.3 % (ref 11.5–15.5)
WBC: 10.1 10*3/uL (ref 4.0–10.5)

## 2011-08-16 LAB — GLUCOSE, CAPILLARY: Glucose-Capillary: 119 mg/dL — ABNORMAL HIGH (ref 70–99)

## 2011-08-16 LAB — VANCOMYCIN, TROUGH: Vancomycin Tr: 22.3 ug/mL — ABNORMAL HIGH (ref 10.0–20.0)

## 2011-08-16 MED ORDER — POTASSIUM CHLORIDE CRYS ER 20 MEQ PO TBCR
30.0000 meq | EXTENDED_RELEASE_TABLET | Freq: Once | ORAL | Status: DC
  Filled 2011-08-16: qty 1

## 2011-08-16 MED ORDER — VANCOMYCIN HCL 1000 MG IV SOLR
1250.0000 mg | INTRAVENOUS | Status: DC
  Filled 2011-08-16: qty 1250

## 2011-08-16 MED ORDER — POTASSIUM CHLORIDE 20 MEQ PO PACK: 40.0000 meq | PACK | Freq: Every day | ORAL | Status: DC

## 2011-08-16 MED ORDER — DOXYCYCLINE HYCLATE 50 MG PO CAPS: 100.0000 mg | ORAL_CAPSULE | Freq: Two times a day (BID) | ORAL | Status: AC

## 2011-08-16 MED ORDER — POTASSIUM CHLORIDE CRYS ER 20 MEQ PO TBCR
60.0000 meq | EXTENDED_RELEASE_TABLET | Freq: Once | ORAL | Status: AC
  Administered 2011-08-16: 60 meq via ORAL
  Filled 2011-08-16: qty 3
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
## 2011-08-16 NOTE — Progress Notes (Signed)
Contacted pharmacy regarding Vanc Trough level 22.3.  Advised to hold and pharmacy will change time on medication to be administered.

## 2011-08-16 NOTE — Progress Notes (Signed)
ANTIBIOTIC CONSULT NOTE - FOLLOW UP  Pharmacy Consult for Vancomycin Indication: cellulitis  Allergies  Allergen Reactions  . Erythromycin Nausea And Vomiting  . Penicillins Swelling  . Sulfonamide Derivatives Nausea And Vomiting    Patient Measurements: Height: 5\' 8"  (172.7 cm) Weight: 230 lb (104.327 kg) IBW/kg (Calculated) : 63.9    Vital Signs: Temp: 97.9 F (36.6 C) (11/14 0625) Temp src: Oral (11/14 0625) BP: 115/72 mmHg (11/14 0625) Pulse Rate: 80  (11/14 0625) Intake/Output from previous day: 11/13 0701 - 11/14 0700 In: 2646.3 [P.O.:840; I.V.:1306.3; IV Piggyback:500] Out: 3050 [Urine:3050] Intake/Output from this shift: Total I/O In: 240 [P.O.:240] Out: 400 [Urine:400]  Labs:  [facility] 08/16/11 0651 08/15/11 0415 08/14/11 2107  WBC 10.1 9.6 10.2  HGB 12.8 12.1 11.8*  PLT 209 227 215  LABCREA -- -- --  CREATININE 0.80 0.65 0.61   Estimated Creatinine Clearance: 75.7 ml/min (by C-G formula based on Cr of 0.8).  Basename 08/16/11 1003  VANCOTROUGH 22.3*  VANCOPEAK --  Drue Dun --  GENTTROUGH --  GENTPEAK --  GENTRANDOM --  TOBRATROUGH --  TOBRAPEAK --  TOBRARND --  AMIKACINPEAK --  AMIKACINTROU --  AMIKACIN --     Microbiology: Recent Results (from the past 720 hour(s))  MRSA PCR SCREENING     Status: Normal   Collection Time   08/15/11  9:49 AM      Component Value Range Status Comment   MRSA by PCR NEGATIVE  NEGATIVE  Final     Anti-infectives     Start     Dose/Rate Route Frequency Ordered Stop   08/14/11 2300   vancomycin (VANCOCIN) IVPB 1000 mg/200 mL premix        1,000 mg 200 mL/hr over 60 Minutes Intravenous Every 12 hours 08/14/11 2213     08/14/11 2200   levofloxacin (LEVAQUIN) IVPB 500 mg        500 mg 100 mL/hr over 60 Minutes Intravenous Every 24 hours 08/14/11 2044     08/14/11 1600   moxifloxacin (AVELOX) IVPB 400 mg        400 mg 250 mL/hr over 60 Minutes Intravenous  Once 08/14/11 1557 08/14/11 1833           Assessment: 77 yo F on Day # 3 Vanco 1g IV q12h (plus Levaquin per MD) for LLE cellulitis. Alerted by RN that Vanco trough = 22.3 (above goal 10-15). Have asked RN to hold current doses of Vanco.  Est PK:  Ke=0.04, t1/2 ~ 17hrs  Goal of Therapy:  Vancomycin trough level 10-15 mcg/ml  Plan:  1)  D/C current q12h vanco dose 2)  Change to Vanco 1250mg  IV q24h, next dose due at 18:00. 3)  Recheck trough at new steady state if vanco continues.  Annia Belt 08/16/2011,11:54 AM

## 2011-08-18 ENCOUNTER — Telehealth: Payer: Self-pay | Admitting: Family Medicine

## 2011-08-18 NOTE — Telephone Encounter (Signed)
Notified pt. 

## 2011-08-18 NOTE — Telephone Encounter (Signed)
Message left for Dr. Clent Ridges .

## 2011-08-18 NOTE — Telephone Encounter (Signed)
Pt was in hosp for 3 days for infected leg wound. Pt is diabetic. Pt was discharged from hosp wed afternoon and said that wound is infected again and is req work in to see Dr Clent Ridges today, early am or early afternoon.

## 2011-08-18 NOTE — Telephone Encounter (Signed)
I am not able to see her today. Possibly someone else here could or she could see the Saturday clinic

## 2011-08-21 ENCOUNTER — Telehealth: Payer: Self-pay | Admitting: Family Medicine

## 2011-08-21 NOTE — Telephone Encounter (Signed)
She should be on 200 mg a day (or 2 100mg  tabs)

## 2011-08-21 NOTE — Telephone Encounter (Signed)
Refill request for Sertraline HCL and a 90 day supply to [facility]. Does pt take 100 mg or 200 mg?

## 2011-08-23 MED ORDER — SERTRALINE HCL 100 MG PO TABS: ORAL_TABLET | ORAL | Status: DC

## 2011-08-23 NOTE — Telephone Encounter (Signed)
Rx called into [facility], 2 tabs daily, #180 with 1 refill

## 2011-08-29 ENCOUNTER — Ambulatory Visit (INDEPENDENT_AMBULATORY_CARE_PROVIDER_SITE_OTHER): Payer: Medicare Other | Admitting: Family Medicine

## 2011-08-29 ENCOUNTER — Encounter: Payer: Self-pay | Admitting: Family Medicine

## 2011-08-29 VITALS — BP 128/86 | HR 99 | Temp 98.4°F | Wt 243.0 lb

## 2011-08-29 DIAGNOSIS — L03119 Cellulitis of unspecified part of limb: Secondary | ICD-10-CM

## 2011-08-29 MED ORDER — OXYCODONE-ACETAMINOPHEN 10-325 MG PO TABS: 1.0000 | ORAL_TABLET | Freq: Four times a day (QID) | ORAL | Status: AC | PRN

## 2011-08-29 MED ORDER — CLINDAMYCIN HCL 300 MG PO CAPS: 300.0000 mg | ORAL_CAPSULE | Freq: Four times a day (QID) | ORAL | Status: AC

## 2011-08-29 MED ORDER — CEFTRIAXONE SODIUM 1 G IJ SOLR
1.0000 g | INTRAMUSCULAR | Status: AC
  Administered 2011-08-29: 1 g via INTRAMUSCULAR

## 2011-08-29 MED ORDER — DEXTROAMPHETAMINE SULFATE 10 MG PO CP24: 20.0000 mg | ORAL_CAPSULE | Freq: Every day | ORAL | Status: DC

## 2011-08-29 NOTE — Progress Notes (Signed)
  Subjective:    Patient ID: Melanie Mcdonald, female    DOB: [birth year], 77 y.o.   MRN: [id number]  HPI Here to recheck a cellulitis in the lower left leg that she has had since she fell about one month ago. She was admitted from 08-14-11 to 08-16-11 for IV antibiotics, and she was sent home on oral Doxycycline. The wound improved a bit while she was in the hospital, but after going home the cellulitis has worsened again. Also 6 days ago she fell again at home, tripping over something on the floor. She landed on her left knee, and the knee has been swollen and painful ever since. She went to Urgent Care that day, and Xrays revealed no fractures.    Review of Systems  Cardiovascular: Positive for leg swelling.  Musculoskeletal: Positive for back pain and arthralgias.  Skin: Positive for wound.       Objective:   Physical Exam  Constitutional: She appears well-developed and well-nourished.  Musculoskeletal:       Left leg is swollen from the foot up to the mid thigh. The lower left leg is red, warm, and tender. The smaller wounds have healed in but the larger wound is is still draining some yellowish fluid. It is lined with granulation tissue          Assessment & Plan:  The cellulitis is not responding well to oral antibiotics. We will refer her to the Wound Clinic ASAP. Given a Rocephin shot today. Switch form Doxycycline to Clindamycin.

## 2011-08-29 NOTE — Progress Notes (Signed)
Addended by: Aniceto Boss A on: 08/29/2011 12:17 PM   Modules accepted: Orders

## 2011-08-30 ENCOUNTER — Emergency Department (HOSPITAL_COMMUNITY)
Admission: EM | Admit: 2011-08-30 | Discharge: 2011-08-30 | Disposition: A | Discharge status: Home / Self Care | Payer: Medicare Other | Attending: Emergency Medicine | Admitting: Emergency Medicine

## 2011-08-30 ENCOUNTER — Encounter (HOSPITAL_COMMUNITY): Payer: Self-pay

## 2011-08-30 ENCOUNTER — Ambulatory Visit: Payer: Medicare Other | Admitting: Family Medicine

## 2011-08-30 ENCOUNTER — Emergency Department (HOSPITAL_COMMUNITY): Payer: Medicare Other

## 2011-08-30 ENCOUNTER — Telehealth: Payer: Self-pay | Admitting: *Deleted

## 2011-08-30 DIAGNOSIS — I10 Essential (primary) hypertension: Secondary | ICD-10-CM | POA: Insufficient documentation

## 2011-08-30 DIAGNOSIS — Z794 Long term (current) use of insulin: Secondary | ICD-10-CM | POA: Insufficient documentation

## 2011-08-30 DIAGNOSIS — I2510 Atherosclerotic heart disease of native coronary artery without angina pectoris: Secondary | ICD-10-CM | POA: Insufficient documentation

## 2011-08-30 DIAGNOSIS — E119 Type 2 diabetes mellitus without complications: Secondary | ICD-10-CM | POA: Insufficient documentation

## 2011-08-30 DIAGNOSIS — L97909 Non-pressure chronic ulcer of unspecified part of unspecified lower leg with unspecified severity: Secondary | ICD-10-CM | POA: Insufficient documentation

## 2011-08-30 DIAGNOSIS — Z09 Encounter for follow-up examination after completed treatment for conditions other than malignant neoplasm: Secondary | ICD-10-CM | POA: Insufficient documentation

## 2011-08-30 DIAGNOSIS — Z8673 Personal history of transient ischemic attack (TIA), and cerebral infarction without residual deficits: Secondary | ICD-10-CM | POA: Insufficient documentation

## 2011-08-30 MED ORDER — DOXYCYCLINE HYCLATE 100 MG PO CAPS: 100.0000 mg | ORAL_CAPSULE | Freq: Two times a day (BID) | ORAL | Status: AC

## 2011-08-30 NOTE — ED Provider Notes (Signed)
History     CSN: [id number] Arrival date & time: 08/30/2011  1:38 PM   First MD Initiated Contact with Patient 08/30/11 1346      Chief Complaint  Patient presents with  . Wound Check    (Consider location/radiation/quality/duration/timing/severity/associated sxs/prior treatment) HPI Comments: Pt states she fell back on 08/02/2011  and was admitted for IV antibiotics and to allow the wound to heal via secondary intention.  She was discharged on 7 days of antibiotics.  Dr. Clent Ridges had stents put her on clindamycin yesterday.  The patient presents today for wound check.  She states clindamycin makes her feel sick.  Patient denies fevers, night sweats, chills.  She is currently afebrile and states that her wound is not her period.  She has a followup appointment with a wound clinic nt week.  She has no other complaints.  Patient is a 77 y.o. female presenting with wound check. The history is provided by the patient and medical records.  Wound Check     Past Medical History  Diagnosis Date  . ANEMIA 01/30/2008  . ARTHRITIS 12/11/2007  . BACK PAIN, CHRONIC 06/23/2008  . BREAST CYST, RIGHT 12/17/2007  . CONTACT DERMATITIS 03/10/2009  . CORONARY ARTERY DISEASE 03/01/2007  . CYSTOCELE WITHOUT MENTION UTERINE PROLAPSE LAT 09/12/2010  . DEGENERATIVE JOINT DISEASE 08/07/2007  . DEPRESSION 03/01/2007  . DIABETES MELLITUS, TYPE II 08/26/2008  . Esophageal reflux 11/06/2007  . HYPERKERATOSIS 06/02/2009  . HYPERLIPIDEMIA 08/26/2008  . HYPERTENSION 03/01/2007  . Irritable bowel syndrome 01/26/2009  . LABYRINTHITIS 11/03/2009  . Narcolepsy without cataplexy 08/26/2008  . OBESITY 03/01/2007  . PERIPHERAL VASCULAR DISEASE 01/26/2009  . SYNCOPE 08/26/2008  . TRANSIENT ISCHEMIC ATTACK 10/20/2009  . NEPHROLITHIASIS 04/29/2009    Past Surgical History  Procedure Date  . Cataract extraction   . Abdominal hysterectomy   . Back surgery     x2  . Tonsilectomy, adenoidectomy, bilateral myringotomy and tubes   .  Spine surgery     x 3  . Cardiac catheterization 07/2009    Family History  Problem Relation Age of Onset  . Lupus Mother   . COPD Father     History  Substance Use Topics  . Smoking status: Never Smoker   . Smokeless tobacco: Never Used  . Alcohol Use: No    OB History    Grav Para Term Preterm Abortions TAB SAB Ect Mult Living                  Review of Systems  Constitutional: Negative for fever, chills and fatigue.  All other systems reviewed and are negative.    Allergies  Erythromycin; Penicillins; and Sulfonamide derivatives  Home Medications   Current Outpatient Rx  Name Route Sig Dispense Refill  . ALBUTEROL SULFATE HFA 108 (90 BASE) MCG/ACT IN AERS Inhalation Inhale 2 puffs into the lungs every 4 (four) hours as needed for wheezing or shortness of breath. 1 Inhaler 5  . CLINDAMYCIN HCL 300 MG PO CAPS Oral Take 1 capsule (300 mg total) by mouth 4 (four) times daily. 40 capsule 0  . CLOPIDOGREL BISULFATE 75 MG PO TABS Oral Take 1 tablet (75 mg total) by mouth daily. 30 tablet 11  . CYCLOBENZAPRINE HCL 10 MG PO TABS Oral Take 10 mg by mouth 3 (three) times daily as needed. Muscle spasm    . DEXTROAMPHETAMINE SULFATE 10 MG PO CP24 Oral Take 2 capsules (20 mg total) by mouth daily. So will stay awake 60 capsule 0  .  DICLOFENAC SODIUM 1 % TD GEL Topical Apply 1 application topically daily. pain    . DULOXETINE HCL 60 MG PO CPEP Oral Take 1 capsule (60 mg total) by mouth daily. 28 capsule 0  . FUROSEMIDE 80 MG PO TABS Oral Take 80 mg by mouth 2 (two) times daily. 3 tabs po qd    . GLIPIZIDE ER 10 MG PO TB24       . INSULIN GLARGINE 100 UNIT/ML Ursina SOLN Subcutaneous Inject 100 Units into the skin daily. 18 mL 3  . LOSARTAN POTASSIUM-HCTZ 100-25 MG PO TABS  TAKE 1 TABLET BY MOUTH DAILY FOR BLOOD PRESSURE 30 tablet 5  . NITROGLYCERIN 0.6 MG SL SUBL Sublingual Place 1 tablet (0.6 mg total) under the tongue every 5 (five) minutes as needed. 90 tablet 11  .  OXYCODONE-ACETAMINOPHEN 10-325 MG PO TABS Oral Take 1 tablet by mouth every 6 (six) hours as needed. pain    . OXYCODONE-ACETAMINOPHEN 10-325 MG PO TABS Oral Take 1 tablet by mouth every 6 (six) hours as needed for pain. 100 tablet 0  . PHENAZOPYRIDINE HCL 200 MG PO TABS Oral Take 200 mg by mouth 3 (three) times daily as needed. pain    . POTASSIUM CHLORIDE 20 MEQ PO PACK Oral Take 40 mEq by mouth daily. 30 tablet 0  . PRAMIPEXOLE DIHYDROCHLORIDE 1 MG PO TABS Oral Take 1 tablet (1 mg total) by mouth 3 (three) times daily. 90 tablet 11  . PROMETHAZINE HCL 25 MG PO TABS Oral Take 25 mg by mouth every 6 (six) hours as needed. nausea    . SERTRALINE HCL 100 MG PO TABS  2 tabs daily 180 tablet 1    BP 162/77  Pulse 86  Temp 98.5 F (36.9 C)  Resp 16  SpO2 95%  Physical Exam  Nursing note and vitals reviewed. Constitutional: She is oriented to person, place, and time. She appears well-developed and well-nourished. No distress.  HENT:  Head: Normocephalic and atraumatic.  Eyes: EOM are normal.       Normal appearance  Neck: Normal range of motion.  Cardiovascular: Normal rate, regular rhythm and normal heart sounds.   Pulmonary/Chest: Effort normal.  Musculoskeletal:       Left lower leg: She exhibits tenderness and deformity. She exhibits no bony tenderness, no swelling and no edema.       Legs:      4cm long no strike that 4 cm long wound healing by secondary intention.  No erythematous border.  Not currently draining.  Needs debridement.  Neurological: She is alert and oriented to person, place, and time.  Psychiatric: She has a normal mood and affect. Her behavior is normal.    ED Course  Procedures (including critical care time)  Wound was debrided and patient's antibiotic was changed from clindamycin to doxycycline per her request because she has a negative reaction to all mycin drugs.  Patient currently has no complaints and states she will followup with her wound clinic  appointment that is next week and she will return to the emergency department if she becomes symptomatic for infection.    No current draining or cellulitis.   MDM  Wound check         Jaci Carrel, [place] 08/30/11 1609

## 2011-08-30 NOTE — ED Notes (Signed)
Patient reports that she has been taking antibiotics for left lower leg wound x 4 weeks. Patient originally had stick in same and was admitted for iv antibiotics. Purulent drainage from same with redness

## 2011-08-30 NOTE — ED Notes (Signed)
Pt. Reports that she originally fell 08/02/11 into a ditch that ws 8 ft deep and had a wound left lower leg that was caused by a stick. Pt. Had been hospitalized and received IV antibiotics and was released a week ago. Pt. Is here for a wound recheck. Pt. Had gone to her PCP yesterday and was told to come to the ED for continuing to have purulent drainage and redness around wound.

## 2011-08-30 NOTE — Telephone Encounter (Signed)
patient  Is calling because her leg is red, swollen and painful.     Patient to go to ER per Dr Clent Ridges - Patient aware and agreed.

## 2011-09-01 NOTE — ED Provider Notes (Signed)
Medical screening examination/treatment/procedure(s) were performed by non-physician practitioner and as supervising physician I was immediately available for consultation/collaboration.   Alanii Ramer A. Mylan Lengyel, MD 09/01/11 1244 

## 2011-09-08 ENCOUNTER — Other Ambulatory Visit (HOSPITAL_BASED_OUTPATIENT_CLINIC_OR_DEPARTMENT_OTHER): Payer: Self-pay | Admitting: General Surgery

## 2011-09-08 ENCOUNTER — Encounter (HOSPITAL_BASED_OUTPATIENT_CLINIC_OR_DEPARTMENT_OTHER): Payer: Medicare Other | Attending: General Surgery

## 2011-09-08 DIAGNOSIS — Z79899 Other long term (current) drug therapy: Secondary | ICD-10-CM | POA: Insufficient documentation

## 2011-09-08 DIAGNOSIS — L03119 Cellulitis of unspecified part of limb: Secondary | ICD-10-CM | POA: Insufficient documentation

## 2011-09-08 DIAGNOSIS — L02419 Cutaneous abscess of limb, unspecified: Secondary | ICD-10-CM | POA: Insufficient documentation

## 2011-09-08 DIAGNOSIS — I10 Essential (primary) hypertension: Secondary | ICD-10-CM | POA: Insufficient documentation

## 2011-09-08 DIAGNOSIS — S81009A Unspecified open wound, unspecified knee, initial encounter: Secondary | ICD-10-CM | POA: Insufficient documentation

## 2011-09-08 DIAGNOSIS — R609 Edema, unspecified: Secondary | ICD-10-CM | POA: Insufficient documentation

## 2011-09-08 DIAGNOSIS — E669 Obesity, unspecified: Secondary | ICD-10-CM | POA: Insufficient documentation

## 2011-09-08 DIAGNOSIS — E119 Type 2 diabetes mellitus without complications: Secondary | ICD-10-CM | POA: Insufficient documentation

## 2011-09-08 DIAGNOSIS — W269XXA Contact with unspecified sharp object(s), initial encounter: Secondary | ICD-10-CM | POA: Insufficient documentation

## 2011-09-08 LAB — GLUCOSE, CAPILLARY: Glucose-Capillary: 77 mg/dL (ref 70–99)

## 2011-09-08 NOTE — H&P (Signed)
NAME:  ELECTA, STERRY NO.:  [id number]  MEDICAL RECORD NO.:  [id number]  LOCATION:  FOOT                         FACILITY:  [facility]  PHYSICIAN:  Ardath Sax, M.D.     DATE OF BIRTH:  January 26, 1933  DATE OF ADMISSION:  09/08/2011 DATE OF DISCHARGE:                             HISTORY & PHYSICAL   Melanie Mcdonald is an obese 77 year old lady who about 6 weeks ago, fell 10 feet down a ditch and received a laceration to the lateral aspect of her left leg, it is about half a cm deep and 4-cm long, and it had some superficial infection.  She has a history of hypertension along with her obesity.  She is also on Lasix for her high blood pressure.  She is on Plavix apparently for peripheral vascular disease.  She is a type 2 diabetic and she recently was in the hospital because of infection in this leg and they treated her with IV antibiotics.  She has a normal white blood count now and a hemoglobin of 12.  Her temperature is normal.  ALLERGIES:  She is allergic to PENICILLIN.  PHYSICAL EXAMINATION:  She has cellulitis of both of her legs really, they are very edematous and I debrided the wound with a curette and were treating it with Santyl, and she will come back in a week.  I also put her on doxycycline and told her to make sure she takes her diuretics and keeps her legs elevated.  We put her in a Profore Lite dressing.     Ardath Sax, M.D.     PP/MEDQ  D:  09/08/2011  T:  09/08/2011  Job:  [id number]

## 2011-10-06 ENCOUNTER — Encounter (HOSPITAL_BASED_OUTPATIENT_CLINIC_OR_DEPARTMENT_OTHER): Payer: Medicare Other | Attending: General Surgery

## 2011-10-06 DIAGNOSIS — E119 Type 2 diabetes mellitus without complications: Secondary | ICD-10-CM | POA: Insufficient documentation

## 2011-10-06 DIAGNOSIS — Z79899 Other long term (current) drug therapy: Secondary | ICD-10-CM | POA: Insufficient documentation

## 2011-10-06 DIAGNOSIS — L02419 Cutaneous abscess of limb, unspecified: Secondary | ICD-10-CM | POA: Insufficient documentation

## 2011-10-06 DIAGNOSIS — L03119 Cellulitis of unspecified part of limb: Secondary | ICD-10-CM | POA: Insufficient documentation

## 2011-10-06 DIAGNOSIS — R609 Edema, unspecified: Secondary | ICD-10-CM | POA: Insufficient documentation

## 2011-10-06 DIAGNOSIS — I10 Essential (primary) hypertension: Secondary | ICD-10-CM | POA: Insufficient documentation

## 2011-10-06 DIAGNOSIS — E669 Obesity, unspecified: Secondary | ICD-10-CM | POA: Insufficient documentation

## 2011-10-06 DIAGNOSIS — W269XXA Contact with unspecified sharp object(s), initial encounter: Secondary | ICD-10-CM | POA: Insufficient documentation

## 2011-10-06 DIAGNOSIS — S81009A Unspecified open wound, unspecified knee, initial encounter: Secondary | ICD-10-CM | POA: Insufficient documentation

## 2011-10-07 ENCOUNTER — Other Ambulatory Visit: Payer: Self-pay | Admitting: Family Medicine

## 2011-10-14 ENCOUNTER — Other Ambulatory Visit: Payer: Self-pay | Admitting: Family Medicine

## 2011-10-18 ENCOUNTER — Emergency Department (HOSPITAL_COMMUNITY): Payer: Medicare Other

## 2011-10-18 ENCOUNTER — Encounter (HOSPITAL_COMMUNITY): Payer: Self-pay | Admitting: Family Medicine

## 2011-10-18 ENCOUNTER — Emergency Department (HOSPITAL_COMMUNITY)
Admission: EM | Admit: 2011-10-18 | Discharge: 2011-10-18 | Disposition: A | Discharge status: Home / Self Care | Payer: Medicare Other | Attending: Emergency Medicine | Admitting: Emergency Medicine

## 2011-10-18 DIAGNOSIS — W108XXA Fall (on) (from) other stairs and steps, initial encounter: Secondary | ICD-10-CM | POA: Insufficient documentation

## 2011-10-18 DIAGNOSIS — Z794 Long term (current) use of insulin: Secondary | ICD-10-CM | POA: Insufficient documentation

## 2011-10-18 DIAGNOSIS — I2510 Atherosclerotic heart disease of native coronary artery without angina pectoris: Secondary | ICD-10-CM | POA: Insufficient documentation

## 2011-10-18 DIAGNOSIS — Z8673 Personal history of transient ischemic attack (TIA), and cerebral infarction without residual deficits: Secondary | ICD-10-CM | POA: Insufficient documentation

## 2011-10-18 DIAGNOSIS — I10 Essential (primary) hypertension: Secondary | ICD-10-CM | POA: Insufficient documentation

## 2011-10-18 DIAGNOSIS — E119 Type 2 diabetes mellitus without complications: Secondary | ICD-10-CM | POA: Insufficient documentation

## 2011-10-18 DIAGNOSIS — M25559 Pain in unspecified hip: Secondary | ICD-10-CM | POA: Insufficient documentation

## 2011-10-18 DIAGNOSIS — S91009A Unspecified open wound, unspecified ankle, initial encounter: Secondary | ICD-10-CM | POA: Insufficient documentation

## 2011-10-18 DIAGNOSIS — IMO0002 Reserved for concepts with insufficient information to code with codable children: Secondary | ICD-10-CM

## 2011-10-18 DIAGNOSIS — W19XXXA Unspecified fall, initial encounter: Secondary | ICD-10-CM

## 2011-10-18 DIAGNOSIS — S81009A Unspecified open wound, unspecified knee, initial encounter: Secondary | ICD-10-CM | POA: Insufficient documentation

## 2011-10-18 MED ORDER — ONDANSETRON HCL 4 MG/2ML IJ SOLN
4.0000 mg | Freq: Once | INTRAMUSCULAR | Status: AC
  Administered 2011-10-18: 4 mg via INTRAVENOUS
  Filled 2011-10-18: qty 2

## 2011-10-18 MED ORDER — MORPHINE SULFATE 2 MG/ML IJ SOLN
2.0000 mg | Freq: Once | INTRAMUSCULAR | Status: AC
  Administered 2011-10-18: 2 mg via INTRAVENOUS
  Filled 2011-10-18: qty 1

## 2011-10-18 NOTE — ED Notes (Signed)
Patient transported to X-ray 

## 2011-10-18 NOTE — ED Provider Notes (Signed)
History     CSN: [id number]  Arrival date & time 10/18/11  Melanie Mcdonald   First MD Initiated Contact with Patient 10/18/11 1823      Chief Complaint  Patient presents with  . Fall  . Extremity Laceration    (Consider location/radiation/quality/duration/timing/severity/associated sxs/prior treatment) HPI  Pt presents to the ED with complaint of a fall down wooden stairs. She states the stairs are "open" and her foot slipped backwards and she landed on her left hip. She denies syncope, LOC, hitting her neck or head. She has not been sick recently and did not feel as if she was going to pass out right before fall. The patient received a large laceration to her right tibia, bleeding is controlled at this time.  Past Medical History  Diagnosis Date  . ANEMIA 01/30/2008  . ARTHRITIS 12/11/2007  . BACK PAIN, CHRONIC 06/23/2008  . BREAST CYST, RIGHT 12/17/2007  . CONTACT DERMATITIS 03/10/2009  . CORONARY ARTERY DISEASE 03/01/2007  . CYSTOCELE WITHOUT MENTION UTERINE PROLAPSE LAT 09/12/2010  . DEGENERATIVE JOINT DISEASE 08/07/2007  . DEPRESSION 03/01/2007  . DIABETES MELLITUS, TYPE II 08/26/2008  . Esophageal reflux 11/06/2007  . HYPERKERATOSIS 06/02/2009  . HYPERLIPIDEMIA 08/26/2008  . HYPERTENSION 03/01/2007  . Irritable bowel syndrome 01/26/2009  . LABYRINTHITIS 11/03/2009  . Narcolepsy without cataplexy 08/26/2008  . OBESITY 03/01/2007  . PERIPHERAL VASCULAR DISEASE 01/26/2009  . SYNCOPE 08/26/2008  . TRANSIENT ISCHEMIC ATTACK 10/20/2009  . NEPHROLITHIASIS 04/29/2009    Past Surgical History  Procedure Date  . Cataract extraction   . Abdominal hysterectomy   . Back surgery     x2  . Tonsilectomy, adenoidectomy, bilateral myringotomy and tubes   . Spine surgery     x 3  . Cardiac catheterization 07/2009    Family History  Problem Relation Age of Onset  . Lupus Mother   . COPD Father     History  Substance Use Topics  . Smoking status: Never Smoker   . Smokeless tobacco: Never Used  .  Alcohol Use: No    OB History    Grav Para Term Preterm Abortions TAB SAB Ect Mult Living                  Review of Systems  All other systems reviewed and are negative.    Allergies  Erythromycin; Penicillins; and Sulfonamide derivatives  Home Medications   Current Outpatient Rx  Name Route Sig Dispense Refill  . CLOPIDOGREL BISULFATE 75 MG PO TABS Oral Take 1 tablet (75 mg total) by mouth daily. 30 tablet 11  . CYCLOBENZAPRINE HCL 10 MG PO TABS Oral Take 10 mg by mouth 3 (three) times daily as needed. Muscle spasm    . DEXTROAMPHETAMINE SULFATE 10 MG PO CP24 Oral Take 2 capsules (20 mg total) by mouth daily. So will stay awake 60 capsule 0  . DICLOFENAC SODIUM 1 % TD GEL Topical Apply 1 application topically daily. pain    . DULOXETINE HCL 60 MG PO CPEP Oral Take 1 capsule (60 mg total) by mouth daily. 28 capsule 0  . FUROSEMIDE 80 MG PO TABS Oral Take 80 mg by mouth 2 (two) times daily. 3 tabs po qd    . GLIPIZIDE ER 10 MG PO TB24       . GLIPIZIDE ER 10 MG PO TB24  TAKE 2 TABLETS BY MOUTH ONCE A DAY 60 tablet 5  . INSULIN GLARGINE 100 UNIT/ML  SOLN Subcutaneous Inject 100 Units into the skin at  bedtime.    Marland Kitchen LOSARTAN POTASSIUM-HCTZ 100-25 MG PO TABS  TAKE 1 TABLET BY MOUTH DAILY FOR BLOOD PRESSURE 30 tablet 5  . NITROGLYCERIN 0.6 MG SL SUBL Sublingual Place 1 tablet (0.6 mg total) under the tongue every 5 (five) minutes as needed. 90 tablet 11  . OXYCODONE-ACETAMINOPHEN 10-325 MG PO TABS Oral Take 1 tablet by mouth every 6 (six) hours as needed. pain    . PHENAZOPYRIDINE HCL 200 MG PO TABS Oral Take 200 mg by mouth 3 (three) times daily as needed. pain    . POTASSIUM CHLORIDE 20 MEQ PO PACK Oral Take 40 mEq by mouth daily. 30 tablet 0  . PRAMIPEXOLE DIHYDROCHLORIDE 1 MG PO TABS Oral Take 1 tablet (1 mg total) by mouth 3 (three) times daily. 90 tablet 11  . PROMETHAZINE HCL 25 MG PO TABS Oral Take 25 mg by mouth every 6 (six) hours as needed. nausea    . SERTRALINE HCL 100  MG PO TABS  2 tabs daily 180 tablet 1    BP 172/101  Pulse 103  Temp(Src) 99 F (37.2 C) (Oral)  Resp 20  SpO2 97%  Physical Exam  Constitutional: She is oriented to person, place, and time. She appears well-developed and well-nourished.  HENT:  Head: Normocephalic and atraumatic.  Eyes: Conjunctivae are normal. Pupils are equal, round, and reactive to light.  Neck: Trachea normal, normal range of motion and full passive range of motion without pain. Neck supple.  Cardiovascular: Normal rate, regular rhythm and normal pulses.   Pulmonary/Chest: Effort normal and breath sounds normal. Chest wall is not dull to percussion. She exhibits no tenderness, no crepitus, no edema, no deformity and no retraction.  Abdominal: Soft. Normal appearance and bowel sounds are normal.  Musculoskeletal:       Right ankle: She exhibits ecchymosis and laceration. She exhibits normal range of motion, no swelling, no deformity and normal pulse. tenderness. No lateral malleolus and no medial malleolus tenderness found.  Lymphadenopathy:       Head (right side): No submental, no submandibular, no tonsillar, no preauricular, no posterior auricular and no occipital adenopathy present.       Head (left side): No submental, no submandibular, no tonsillar, no preauricular, no posterior auricular and no occipital adenopathy present.    She has no cervical adenopathy.    She has no axillary adenopathy.  Neurological: She is oriented to person, place, and time. She has normal strength. No cranial nerve deficit. Coordination normal.  Skin: Skin is warm and dry. Laceration (aprrox 10cm laceration in an "S" shape to right Tibia.) noted.     Psychiatric: Her speech is normal. Cognition and memory are normal.    ED Course  Procedures (including critical care time)  Labs Reviewed - No data to display Dg Hip Complete Left  10/18/2011  *RADIOLOGY REPORT*  Clinical Data: Fall.  Left hip pain  LEFT HIP - COMPLETE 2+ VIEW   Comparison: None.  Findings: Negative for fracture.  Normal alignment.  No significant degenerative change in the left hip.  IMPRESSION: Negative  Original Report Authenticated By: Camelia Phenes, M.D.   Dg Tibia/fibula Right  10/18/2011  *RADIOLOGY REPORT*  Clinical Data: Fall  RIGHT TIBIA AND FIBULA - 2 VIEW  Comparison: None.  Findings: Negative for fracture.  Normal alignment of the ankle and knee.  No focal bony abnormality. Soft tissue laceration anteriorly.  IMPRESSION: Negative for fracture.  Original Report Authenticated By: Camelia Phenes, M.D.   Dg  Ankle Complete Right  10/18/2011  *RADIOLOGY REPORT*  Clinical Data: Fall  RIGHT ANKLE - COMPLETE 3+ VIEW  Comparison: None.  Findings: Negative for fracture.  Soft tissue swelling is present around the ankle.  Normal alignment and normal joint space.  Soft tissue laceration anteriorly above the ankle.  IMPRESSION: Negative for fracture.  Original Report Authenticated By: Camelia Phenes, M.D.     No diagnosis found.    MDM  Pt is on a pain management contract with Dr. Clent Ridges and has oxycodone at home. Therefore, I am not prescribing pain medication. She has agreed to follow-up and see doctor FRY in his office tomorrow for close watch on her wound.  Family friend is in room with patient, they live next door and they agree to check up on her multiple times tomorrow and over the next few      LACERATION REPAIR Performed by: Dorthula Matas Authorized by: Dorthula Matas Consent: Verbal consent obtained. Risks and benefits: risks, benefits and alternatives were discussed Consent given by: patient Patient identity confirmed: provided demographic data Prepped and Draped in normal sterile fashion Wound explored  Laceration Location: right mid tibia  Laceration Length: 8 cm "S" shaped  No Foreign Bodies seen or palpated  Anesthesia: local infiltration  Local anesthetic: lidocaine 2% with epinephrine  Anesthetic total: 6  ml  Irrigation method: syringe Amount of cleaning: standard  Skin closure: staples  Number of sutures: 11  Technique: Stapler  Patient tolerance: Patient tolerated the procedure well with no immediate complications.   Dorthula Matas, PA 10/20/11 1759

## 2011-10-18 NOTE — ED Notes (Signed)
Bleeding controlled.

## 2011-10-18 NOTE — ED Notes (Signed)
Pt reports slipping and falling on wet wooden steps today and hitting leg on steps and cutting right lower extremity.

## 2011-10-19 ENCOUNTER — Encounter: Payer: Self-pay | Admitting: Family

## 2011-10-19 ENCOUNTER — Other Ambulatory Visit: Payer: Self-pay | Admitting: Family Medicine

## 2011-10-19 ENCOUNTER — Ambulatory Visit (INDEPENDENT_AMBULATORY_CARE_PROVIDER_SITE_OTHER): Payer: Medicare Other | Admitting: Family

## 2011-10-19 DIAGNOSIS — S81809A Unspecified open wound, unspecified lower leg, initial encounter: Secondary | ICD-10-CM

## 2011-10-19 DIAGNOSIS — S8991XA Unspecified injury of right lower leg, initial encounter: Secondary | ICD-10-CM

## 2011-10-19 DIAGNOSIS — E119 Type 2 diabetes mellitus without complications: Secondary | ICD-10-CM

## 2011-10-19 DIAGNOSIS — S81819A Laceration without foreign body, unspecified lower leg, initial encounter: Secondary | ICD-10-CM

## 2011-10-19 MED ORDER — OXYCODONE-ACETAMINOPHEN 10-325 MG PO TABS: 1.0000 | ORAL_TABLET | Freq: Four times a day (QID) | ORAL | Status: DC | PRN

## 2011-10-19 MED ORDER — DOXYCYCLINE HYCLATE 100 MG PO TABS: 100.0000 mg | ORAL_TABLET | Freq: Two times a day (BID) | ORAL | Status: DC

## 2011-10-19 NOTE — Telephone Encounter (Signed)
Pt need new rx percocet.  

## 2011-10-19 NOTE — Telephone Encounter (Signed)
Left message on machine that rx is ready to pick up. 

## 2011-10-19 NOTE — Progress Notes (Signed)
Subjective:    Patient ID: Melanie Mcdonald, female    DOB: August 08, 1933, 78 y.o.   MRN: [id number]  HPI Comments: C/o s/p fall yesterday slipping on wet stairs outside with lt lower extremity deep tissue injury requiring 11 staples at [facility]. Staples cdi, no drainage or erythema. Wound rt lower extremity covered with tegaderm.  C/o pain 8/10 and swelling. Previous fall in Oct of this year with injury lt lower extremity, wound was left open and patient stating she goes to [facility] weekly to have ace wrap to lt leg removed and wound packed. Received tetanus shot in Oct with first fall. Ambulates with cane although states her gait is imbalanced after previous back surgery years ago. Family lives out of state and has supportive neighbor. Daily blood sugar checks at home 120's     Review of Systems  Constitutional: Negative.   Respiratory: Negative.   Cardiovascular: Negative.   Neurological: Negative for dizziness, syncope, weakness, light-headedness and headaches.  Psychiatric/Behavioral: Negative for confusion, dysphoric mood and decreased concentration. The patient is not nervous/anxious.    Past Medical History  Diagnosis Date  . ANEMIA 01/30/2008  . ARTHRITIS 12/11/2007  . BACK PAIN, CHRONIC 06/23/2008  . BREAST CYST, RIGHT 12/17/2007  . CONTACT DERMATITIS 03/10/2009  . CORONARY ARTERY DISEASE 03/01/2007  . CYSTOCELE WITHOUT MENTION UTERINE PROLAPSE LAT 09/12/2010  . DEGENERATIVE JOINT DISEASE 08/07/2007  . DEPRESSION 03/01/2007  . DIABETES MELLITUS, TYPE II 08/26/2008  . Esophageal reflux 11/06/2007  . HYPERKERATOSIS 06/02/2009  . HYPERLIPIDEMIA 08/26/2008  . HYPERTENSION 03/01/2007  . Irritable bowel syndrome 01/26/2009  . LABYRINTHITIS 11/03/2009  . Narcolepsy without cataplexy 08/26/2008  . OBESITY 03/01/2007  . PERIPHERAL VASCULAR DISEASE 01/26/2009  . SYNCOPE 08/26/2008  . TRANSIENT ISCHEMIC ATTACK 10/20/2009  . NEPHROLITHIASIS 04/29/2009    History   Social  History  . Marital Status: Widowed    Spouse Name: N/A    Number of Children: N/A  . Years of Education: N/A   Occupational History  . Not on file.   Social History Main Topics  . Smoking status: Never Smoker   . Smokeless tobacco: Never Used  . Alcohol Use: No  . Drug Use: No  . Sexually Active: No   Other Topics Concern  . Not on file   Social History Narrative  . No narrative on file    Past Surgical History  Procedure Date  . Cataract extraction   . Abdominal hysterectomy   . Back surgery     x2  . Tonsilectomy, adenoidectomy, bilateral myringotomy and tubes   . Spine surgery     x 3  . Cardiac catheterization 07/2009    Family History  Problem Relation Age of Onset  . Lupus Mother   . COPD Father     Allergies  Allergen Reactions  . Erythromycin Nausea And Vomiting  . Penicillins Swelling  . Sulfonamide Derivatives Nausea And Vomiting    Current Outpatient Prescriptions on File Prior to Visit  Medication Sig Dispense Refill  . clopidogrel (PLAVIX) 75 MG tablet Take 1 tablet (75 mg total) by mouth daily.  30 tablet  11  . cyclobenzaprine (FLEXERIL) 10 MG tablet Take 10 mg by mouth 3 (three) times daily as needed. Muscle spasm      . dextroamphetamine (DEXEDRINE SPANSULE) 10 MG 24 hr capsule Take 2 capsules (20 mg total) by mouth daily. So will stay awake  60 capsule  0  . diclofenac sodium (VOLTAREN) 1 % GEL  Apply 1 application topically daily. pain      . DULoxetine (CYMBALTA) 60 MG capsule Take 1 capsule (60 mg total) by mouth daily.  28 capsule  0  . furosemide (LASIX) 80 MG tablet Take 80 mg by mouth 2 (two) times daily. 3 tabs po qd      . glipiZIDE (GLUCOTROL XL) 10 MG 24 hr tablet        . glipiZIDE (GLUCOTROL XL) 10 MG 24 hr tablet TAKE 2 TABLETS BY MOUTH ONCE A DAY  60 tablet  5  . insulin glargine (LANTUS) 100 UNIT/ML injection Inject 100 Units into the skin at bedtime.      Marland Kitchen losartan-hydrochlorothiazide (HYZAAR) 100-25 MG per tablet TAKE 1  TABLET BY MOUTH DAILY FOR BLOOD PRESSURE  30 tablet  5  . nitroGLYCERIN (NITROSTAT) 0.6 MG SL tablet Place 1 tablet (0.6 mg total) under the tongue every 5 (five) minutes as needed.  90 tablet  11  . phenazopyridine (PYRIDIUM) 200 MG tablet Take 200 mg by mouth 3 (three) times daily as needed. pain      . potassium chloride (KLOR-CON) 20 MEQ packet Take 40 mEq by mouth daily.  30 tablet  0  . pramipexole (MIRAPEX) 1 MG tablet Take 1 tablet (1 mg total) by mouth 3 (three) times daily.  90 tablet  11  . promethazine (PHENERGAN) 25 MG tablet Take 25 mg by mouth every 6 (six) hours as needed. nausea      . sertraline (ZOLOFT) 100 MG tablet 2 tabs daily  180 tablet  1   Current Facility-Administered Medications on File Prior to Visit  Medication Dose Route Frequency Provider Last Rate Last Dose  . morphine 2 MG/ML injection 2 mg  2 mg Intravenous Once Dorthula Matas, PA   2 mg at 10/18/11 1850  . morphine 2 MG/ML injection 2 mg  2 mg Intravenous Once Dorthula Matas, PA   2 mg at 10/18/11 1936  . ondansetron (ZOFRAN) injection 4 mg  4 mg Intravenous Once Dorthula Matas, PA   4 mg at 10/18/11 1850    BP 142/60  Pulse 109  Temp(Src) 99.1 F (37.3 C) (Oral)  SpO2 95%chart    Objective:   Physical Exam  Constitutional: She is oriented to person, place, and time. She appears well-developed and well-nourished.  Cardiovascular: Normal rate, regular rhythm, normal heart sounds and intact distal pulses.  Exam reveals no gallop and no friction rub.   No murmur heard. Pulmonary/Chest: Effort normal and breath sounds normal. No respiratory distress. She has no wheezes. She has no rales. She exhibits no tenderness.  Neurological: She is alert and oriented to person, place, and time.  Skin: Skin is warm and dry.     Psychiatric: She has a normal mood and affect. Her behavior is normal.          Assessment & Plan:  Assessment Deep tissue injury Plan: Elevate legs daily. Report any reddness,  increased pain, or drainage to MD. Continue to keep appts for dressing change to lt lower extremity. Doxycycline as prescribed

## 2011-10-19 NOTE — Telephone Encounter (Signed)
Done, in your box

## 2011-10-19 NOTE — Patient Instructions (Signed)
Wound Care     Wound care helps prevent pain and infection.   You may need a tetanus shot if:  · You cannot remember when you had your last tetanus shot.   · You have never had a tetanus shot.   · The injury broke your skin.   If you need a tetanus shot and you choose not to have one, you may get tetanus. Sickness from tetanus can be serious.  HOME CARE   · Only take medicine as told by your doctor.   · Clean the wound daily with mild soap and water.   · Change any bandages (dressings) as told by your doctor.   · Put medicated cream and a bandage on the wound as told by your doctor.   · Change the bandage if it gets wet, dirty, or starts to smell.   · Take showers. Do not take baths, swim, or do anything that puts your wound under water.   · Rest and raise (elevate) the wound until the pain and puffiness (swelling) are better.   · Keep all doctor visits as told.   GET HELP RIGHT AWAY IF:   · Yellowish-white fluid (pus) comes from the wound.   · Medicine does not lessen your pain.   · There is a red streak going away from the wound.   · You cannot move your finger or toe.   · You have a fever.   MAKE SURE YOU:   · Understand these instructions.   · Will watch your condition.   · Will get help right away if you are not doing well or get worse.   Document Released: 06/27/2008 Document Revised: 05/31/2011 Document Reviewed: 01/22/2011  [employer]® Patient Information ©2012 [employer].

## 2011-10-20 ENCOUNTER — Encounter (HOSPITAL_COMMUNITY): Payer: Self-pay | Admitting: Emergency Medicine

## 2011-10-20 ENCOUNTER — Emergency Department (HOSPITAL_COMMUNITY)
Admission: EM | Admit: 2011-10-20 | Discharge: 2011-10-20 | Disposition: A | Discharge status: Home / Self Care | Payer: Medicare Other | Attending: Emergency Medicine | Admitting: Emergency Medicine

## 2011-10-20 DIAGNOSIS — K589 Irritable bowel syndrome without diarrhea: Secondary | ICD-10-CM | POA: Insufficient documentation

## 2011-10-20 DIAGNOSIS — Z79899 Other long term (current) drug therapy: Secondary | ICD-10-CM | POA: Insufficient documentation

## 2011-10-20 DIAGNOSIS — E119 Type 2 diabetes mellitus without complications: Secondary | ICD-10-CM | POA: Insufficient documentation

## 2011-10-20 DIAGNOSIS — M7989 Other specified soft tissue disorders: Secondary | ICD-10-CM | POA: Insufficient documentation

## 2011-10-20 DIAGNOSIS — R609 Edema, unspecified: Secondary | ICD-10-CM

## 2011-10-20 DIAGNOSIS — F3289 Other specified depressive episodes: Secondary | ICD-10-CM | POA: Insufficient documentation

## 2011-10-20 DIAGNOSIS — Z794 Long term (current) use of insulin: Secondary | ICD-10-CM | POA: Insufficient documentation

## 2011-10-20 DIAGNOSIS — I739 Peripheral vascular disease, unspecified: Secondary | ICD-10-CM | POA: Insufficient documentation

## 2011-10-20 DIAGNOSIS — E785 Hyperlipidemia, unspecified: Secondary | ICD-10-CM | POA: Insufficient documentation

## 2011-10-20 DIAGNOSIS — Z09 Encounter for follow-up examination after completed treatment for conditions other than malignant neoplasm: Secondary | ICD-10-CM | POA: Insufficient documentation

## 2011-10-20 DIAGNOSIS — IMO0001 Reserved for inherently not codable concepts without codable children: Secondary | ICD-10-CM

## 2011-10-20 DIAGNOSIS — K219 Gastro-esophageal reflux disease without esophagitis: Secondary | ICD-10-CM | POA: Insufficient documentation

## 2011-10-20 DIAGNOSIS — F329 Major depressive disorder, single episode, unspecified: Secondary | ICD-10-CM | POA: Insufficient documentation

## 2011-10-20 DIAGNOSIS — I2510 Atherosclerotic heart disease of native coronary artery without angina pectoris: Secondary | ICD-10-CM | POA: Insufficient documentation

## 2011-10-20 DIAGNOSIS — I10 Essential (primary) hypertension: Secondary | ICD-10-CM | POA: Insufficient documentation

## 2011-10-20 DIAGNOSIS — M549 Dorsalgia, unspecified: Secondary | ICD-10-CM | POA: Insufficient documentation

## 2011-10-20 DIAGNOSIS — Z4800 Encounter for change or removal of nonsurgical wound dressing: Secondary | ICD-10-CM | POA: Insufficient documentation

## 2011-10-20 DIAGNOSIS — G8929 Other chronic pain: Secondary | ICD-10-CM | POA: Insufficient documentation

## 2011-10-20 DIAGNOSIS — M129 Arthropathy, unspecified: Secondary | ICD-10-CM | POA: Insufficient documentation

## 2011-10-20 DIAGNOSIS — Z8673 Personal history of transient ischemic attack (TIA), and cerebral infarction without residual deficits: Secondary | ICD-10-CM | POA: Insufficient documentation

## 2011-10-20 NOTE — ED Notes (Signed)
Pt presenting to ed with c/o right leg with drainage since falling x 2 days ago pt states she was suppose to follow up with her pcp today but her pcp'd office is closed due to inclement weather. Pt states she was seen her Wednesday s/p falling. Pt states she is currently on antibiotics pt states she had a fever of 100 yesterday. Pt denies pain at this time. Pt with staples intact and clear drainage noted. Pt states she does currently take a fluid pill and hasn't taken it today. Pt's skin is not warm to touch. No significant amount of redness noted.

## 2011-10-20 NOTE — ED Notes (Signed)
Pt presenting to ed with c/o right leg with drainage pt states she has history of diabetes and is concerned about infection. Pt states she fell on Wednesday and was seen here in the er but it is now draining more

## 2011-10-20 NOTE — ED Provider Notes (Signed)
History     CSN: [id number]  Arrival date & time 10/20/11  1039   First MD Initiated Contact with Patient 10/20/11 1049      Chief Complaint  Patient presents with  . Wound Check    (Consider location/radiation/quality/duration/timing/severity/associated sxs/prior treatment) HPI Comments: Patient was seen in the Emergency Department two days ago for a laceration on the right lower leg.  Eleven staples were used to close the wound.  Xrays were done at that time which were negative.  She was started on Doxycycline.  Patient comes in today with concern that the wound may be infected.  She reports that her right leg has been draining fluid.  The fluid is clear.  She reports that it began draining yesterday.  She was seen by her primary care physician's office yesterday and the dressing was changed and she was discharged home.  She reports that she has a history of peripheral edema.  She is on Furosemide 80mg  tid, however, she has not taken her furosemide in the past 2 days.  She denies any fevers, erythema, or warmth to the touch of the skin around the wound.  Patient is a 78 y.o. female presenting with wound check. The history is provided by the patient.  Wound Check     Past Medical History  Diagnosis Date  . ANEMIA 01/30/2008  . ARTHRITIS 12/11/2007  . BACK PAIN, CHRONIC 06/23/2008  . BREAST CYST, RIGHT 12/17/2007  . CONTACT DERMATITIS 03/10/2009  . CORONARY ARTERY DISEASE 03/01/2007  . CYSTOCELE WITHOUT MENTION UTERINE PROLAPSE LAT 09/12/2010  . DEGENERATIVE JOINT DISEASE 08/07/2007  . DEPRESSION 03/01/2007  . DIABETES MELLITUS, TYPE II 08/26/2008  . Esophageal reflux 11/06/2007  . HYPERKERATOSIS 06/02/2009  . HYPERLIPIDEMIA 08/26/2008  . HYPERTENSION 03/01/2007  . Irritable bowel syndrome 01/26/2009  . LABYRINTHITIS 11/03/2009  . Narcolepsy without cataplexy 08/26/2008  . OBESITY 03/01/2007  . PERIPHERAL VASCULAR DISEASE 01/26/2009  . SYNCOPE 08/26/2008  . TRANSIENT ISCHEMIC ATTACK  10/20/2009  . NEPHROLITHIASIS 04/29/2009    Past Surgical History  Procedure Date  . Cataract extraction   . Abdominal hysterectomy   . Back surgery     x2  . Tonsilectomy, adenoidectomy, bilateral myringotomy and tubes   . Spine surgery     x 3  . Cardiac catheterization 07/2009    Family History  Problem Relation Age of Onset  . Lupus Mother   . COPD Father     History  Substance Use Topics  . Smoking status: Never Smoker   . Smokeless tobacco: Never Used  . Alcohol Use: No    OB History    Grav Para Term Preterm Abortions TAB SAB Ect Mult Living                  Review of Systems  Constitutional: Negative for fever and chills.  Respiratory: Negative for shortness of breath.   Cardiovascular: Positive for leg swelling. Negative for chest pain.  Gastrointestinal: Negative for nausea and vomiting.  Musculoskeletal: Negative for joint swelling.  Skin: Negative for color change.  Neurological: Negative for numbness.    Allergies  Doxycycline; Erythromycin; Penicillins; and Sulfonamide derivatives  Home Medications   Current Outpatient Rx  Name Route Sig Dispense Refill  . CLOPIDOGREL BISULFATE 75 MG PO TABS Oral Take 1 tablet (75 mg total) by mouth daily. 30 tablet 11  . CYCLOBENZAPRINE HCL 10 MG PO TABS Oral Take 10 mg by mouth 3 (three) times daily as needed. Muscle spasm    .  DEXTROAMPHETAMINE SULFATE ER 10 MG PO CP24 Oral Take 20 mg by mouth daily as needed. For narcolepsy.    Marland Kitchen DICLOFENAC SODIUM 1 % TD GEL Topical Apply 1 application topically daily. For back/shoulder pain.    Marland Kitchen DOXYCYCLINE HYCLATE 100 MG PO TABS Oral Take 1 tablet (100 mg total) by mouth 2 (two) times daily. 14 tablet 0  . FUROSEMIDE 80 MG PO TABS Oral Take 80 mg by mouth 3 (three) times daily.     Marland Kitchen GLIPIZIDE ER 10 MG PO TB24  TAKE 2 TABLETS BY MOUTH ONCE A DAY 60 tablet 5  . INSULIN GLARGINE 100 UNIT/ML [place] SOLN Subcutaneous Inject 100 Units into the skin at bedtime.    Marland Kitchen NITROGLYCERIN  0.6 MG SL SUBL Sublingual Place 1 tablet (0.6 mg total) under the tongue every 5 (five) minutes as needed. 90 tablet 11  . OXYCODONE-ACETAMINOPHEN 10-325 MG PO TABS Oral Take 1 tablet by mouth every 6 (six) hours as needed for pain. pain 30 tablet 0  . PRAMIPEXOLE DIHYDROCHLORIDE 1 MG PO TABS Oral Take 3 mg by mouth at bedtime.    Marland Kitchen PROMETHAZINE HCL 25 MG PO TABS Oral Take 25 mg by mouth every 6 (six) hours as needed. For nausea.      BP 131/69  Pulse 87  Temp(Src) 98.9 F (37.2 C) (Oral)  Resp 20  SpO2 97%  Physical Exam  Nursing note and vitals reviewed. Constitutional: She is oriented to person, place, and time. She appears well-developed and well-nourished. No distress.  HENT:  Head: Normocephalic and atraumatic.  Neck: Normal range of motion. Neck supple.  Cardiovascular: Normal rate, regular rhythm and normal heart sounds.   Pulmonary/Chest: Effort normal and breath sounds normal. No respiratory distress. She has no wheezes.  Musculoskeletal: Normal range of motion.       Bilateral lower extremity edema.  Neurological: She is alert and oriented to person, place, and time.  Skin: Skin is warm and dry. She is not diaphoretic.     Psychiatric: She has a normal mood and affect.    ED Course  Procedures (including critical care time)  Labs Reviewed - No data to display Dg Hip Complete Left  10/18/2011  *RADIOLOGY REPORT*  Clinical Data: Fall.  Left hip pain  LEFT HIP - COMPLETE 2+ VIEW  Comparison: None.  Findings: Negative for fracture.  Normal alignment.  No significant degenerative change in the left hip.  IMPRESSION: Negative  Original Report Authenticated By: Camelia Phenes, M.D.   Dg Tibia/fibula Right  10/18/2011  *RADIOLOGY REPORT*  Clinical Data: Fall  RIGHT TIBIA AND FIBULA - 2 VIEW  Comparison: None.  Findings: Negative for fracture.  Normal alignment of the ankle and knee.  No focal bony abnormality. Soft tissue laceration anteriorly.  IMPRESSION: Negative for  fracture.  Original Report Authenticated By: Camelia Phenes, M.D.   Dg Ankle Complete Right  10/18/2011  *RADIOLOGY REPORT*  Clinical Data: Fall  RIGHT ANKLE - COMPLETE 3+ VIEW  Comparison: None.  Findings: Negative for fracture.  Soft tissue swelling is present around the ankle.  Normal alignment and normal joint space.  Soft tissue laceration anteriorly above the ankle.  IMPRESSION: Negative for fracture.  Original Report Authenticated By: Camelia Phenes, M.D.     No diagnosis found.    MDM  No signs of wound infection.  Patient has not taken her Furosemide for the past 2 days, which is what is most likely causing edema.  Patient instructed to  take her Furosemide as instructed.        Pascal Lux [place], PA-C 10/20/11 1555

## 2011-10-21 NOTE — ED Provider Notes (Signed)
Medical screening examination/treatment/procedure(s) were conducted as a shared visit with non-physician practitioner(s) and myself.  I personally evaluated the patient during the encounter Patient presenting for wound check.  She is in no distress, and the wound appears well healing.  Patient advised to continue home meds and return to staple removal.  Gerhard Munch, MD 10/21/11 2127

## 2011-10-23 NOTE — ED Provider Notes (Signed)
Medical screening examination/treatment/procedure(s) were conducted as a shared visit with non-physician practitioner(s) and myself.  I personally evaluated the patient during the encounter  Loren Racer, MD 10/23/11 [phone]

## 2011-10-27 ENCOUNTER — Ambulatory Visit (INDEPENDENT_AMBULATORY_CARE_PROVIDER_SITE_OTHER): Payer: Medicare Other | Admitting: Family Medicine

## 2011-10-27 ENCOUNTER — Encounter: Payer: Self-pay | Admitting: Family Medicine

## 2011-10-27 VITALS — BP 140/70 | HR 105 | Temp 98.3°F

## 2011-10-27 DIAGNOSIS — S81809A Unspecified open wound, unspecified lower leg, initial encounter: Secondary | ICD-10-CM

## 2011-10-27 DIAGNOSIS — S81811A Laceration without foreign body, right lower leg, initial encounter: Secondary | ICD-10-CM

## 2011-10-27 NOTE — Progress Notes (Signed)
  Subjective:    Patient ID: Melanie Mcdonald, female    DOB: Nov 29, 1932, 78 y.o.   MRN: [id number]  HPI Here to check a wound to her right lower leg which occurred on 10-18-11. Her feet slipped on wet boards on her back porch, causing her to fall. Her right leg was caught between two boards, and she sustained a deep laceration. She went to the ER where staples were used to close the wound. It is still slightly painful for her. She is already going to the [facility] for a slowly healing wound on the left lower leg from a previous fall.    Review of Systems  Constitutional: Negative.   Cardiovascular: Positive for leg swelling.  Skin: Positive for wound.       Objective:   Physical Exam  Constitutional: She appears well-developed and well-nourished.  Cardiovascular: Normal rate, regular rhythm, normal heart sounds and intact distal pulses.   Pulmonary/Chest: Effort normal and breath sounds normal.  Skin:       The right shin has a long laceration with 11 staples in place. This is surrounded by some erythema. It is warm and tender          Assessment & Plan:  All staples were removed. I think she needs to be on some antibiotics again, but I will defer the choice of antibiotics to the Wound Clinic. She will be going straight there from here.

## 2011-11-03 ENCOUNTER — Telehealth: Payer: Self-pay | Admitting: *Deleted

## 2011-11-03 NOTE — Telephone Encounter (Signed)
Pt is asking to speak to Melanie Mcdonald re: a note from Dr. Clent Ridges re: her condition so the wound center could start treating her.

## 2011-11-06 NOTE — Telephone Encounter (Signed)
Spoke with pt and she does not need the referral now.

## 2011-11-08 ENCOUNTER — Other Ambulatory Visit: Payer: Self-pay | Admitting: Family Medicine

## 2011-11-10 ENCOUNTER — Encounter (HOSPITAL_BASED_OUTPATIENT_CLINIC_OR_DEPARTMENT_OTHER): Payer: Medicare Other | Attending: General Surgery

## 2011-11-10 DIAGNOSIS — L97809 Non-pressure chronic ulcer of other part of unspecified lower leg with unspecified severity: Secondary | ICD-10-CM | POA: Insufficient documentation

## 2011-11-10 DIAGNOSIS — I872 Venous insufficiency (chronic) (peripheral): Secondary | ICD-10-CM | POA: Insufficient documentation

## 2011-11-10 NOTE — Telephone Encounter (Signed)
Call in 30 ml with 5 rf

## 2011-12-01 ENCOUNTER — Encounter (HOSPITAL_BASED_OUTPATIENT_CLINIC_OR_DEPARTMENT_OTHER): Payer: Medicare Other | Attending: General Surgery

## 2011-12-01 DIAGNOSIS — I872 Venous insufficiency (chronic) (peripheral): Secondary | ICD-10-CM | POA: Insufficient documentation

## 2011-12-01 DIAGNOSIS — L97809 Non-pressure chronic ulcer of other part of unspecified lower leg with unspecified severity: Secondary | ICD-10-CM | POA: Insufficient documentation

## 2011-12-08 ENCOUNTER — Encounter (HOSPITAL_BASED_OUTPATIENT_CLINIC_OR_DEPARTMENT_OTHER): Payer: Medicare Other

## 2012-01-10 ENCOUNTER — Encounter: Payer: Self-pay | Admitting: Family Medicine

## 2012-01-10 ENCOUNTER — Ambulatory Visit (INDEPENDENT_AMBULATORY_CARE_PROVIDER_SITE_OTHER): Payer: Medicare Other | Admitting: Family Medicine

## 2012-01-10 VITALS — BP 130/74 | HR 106 | Temp 98.7°F | Wt 242.0 lb

## 2012-01-10 DIAGNOSIS — D649 Anemia, unspecified: Secondary | ICD-10-CM

## 2012-01-10 DIAGNOSIS — M1990 Unspecified osteoarthritis, unspecified site: Secondary | ICD-10-CM

## 2012-01-10 DIAGNOSIS — E119 Type 2 diabetes mellitus without complications: Secondary | ICD-10-CM

## 2012-01-10 DIAGNOSIS — I10 Essential (primary) hypertension: Secondary | ICD-10-CM

## 2012-01-10 DIAGNOSIS — R269 Unspecified abnormalities of gait and mobility: Secondary | ICD-10-CM

## 2012-01-10 DIAGNOSIS — R2681 Unsteadiness on feet: Secondary | ICD-10-CM

## 2012-01-10 MED ORDER — CYCLOBENZAPRINE HCL 10 MG PO TABS: 10.0000 mg | ORAL_TABLET | Freq: Three times a day (TID) | ORAL | Status: DC | PRN

## 2012-01-10 MED ORDER — NYSTATIN 100000 UNIT/ML MT SUSP: 200000.0000 [IU] | Freq: Four times a day (QID) | OROMUCOSAL | Status: DC

## 2012-01-10 MED ORDER — OXYCODONE-ACETAMINOPHEN 10-325 MG PO TABS: 1.0000 | ORAL_TABLET | Freq: Four times a day (QID) | ORAL | Status: DC | PRN

## 2012-01-10 MED ORDER — GLIPIZIDE ER 10 MG PO TB24: 10.0000 mg | ORAL_TABLET | Freq: Every day | ORAL | Status: DC

## 2012-01-10 MED ORDER — CLOPIDOGREL BISULFATE 75 MG PO TABS: 75.0000 mg | ORAL_TABLET | Freq: Every day | ORAL | Status: DC

## 2012-01-10 MED ORDER — METHYLPREDNISOLONE ACETATE 80 MG/ML IJ SUSP
120.0000 mg | Freq: Once | INTRAMUSCULAR | Status: AC
  Administered 2012-01-10: 120 mg via INTRAMUSCULAR

## 2012-01-10 MED ORDER — "PEN NEEDLES 5/16"" 31G X 8 MM MISC": Status: DC

## 2012-01-10 MED ORDER — DICLOFENAC SODIUM 1 % TD GEL: 1.0000 "application " | Freq: Every day | TRANSDERMAL | Status: DC

## 2012-01-10 NOTE — Progress Notes (Signed)
  Subjective:    Patient ID: Melanie Mcdonald, female    DOB: [birth year], 78 y.o.   MRN: [id number]  HPI Here to follow up and for med refills. She had been seeing the [facility] Clinic regularly for cellulitis in the lower legs which was the result of lacerations on the lower legs. She has done quite well, and she was recently discharged from the [facility] Clinic. Now she can drive and she gets around more. She enjoys her independence.    Review of Systems  Constitutional: Negative.   HENT: Negative.   Eyes: Negative.   Respiratory: Negative.   Skin: Negative.        Objective:   Physical Exam  Constitutional: She appears well-developed and well-nourished.  Cardiovascular: Normal rate, regular rhythm, normal heart sounds and intact distal pulses.   Pulmonary/Chest: Effort normal and breath sounds normal.  Lymphadenopathy:    She has no cervical adenopathy.  Skin: Skin is warm and dry. No rash noted. No erythema.          Assessment & Plan:  Doing well. Recheck in 6 months.

## 2012-01-11 ENCOUNTER — Other Ambulatory Visit: Payer: Self-pay | Admitting: Family Medicine

## 2012-01-11 ENCOUNTER — Telehealth: Payer: Self-pay | Admitting: Family Medicine

## 2012-01-11 NOTE — Telephone Encounter (Signed)
Patient called stating that the MD forgot to call in her sleeping meds on yesterday. Please assist.

## 2012-01-11 NOTE — Progress Notes (Signed)
Addended by: [name] A on: 01/11/2012 08:16 AM   Modules accepted: Orders

## 2012-01-12 ENCOUNTER — Telehealth: Payer: Self-pay | Admitting: Family Medicine

## 2012-01-12 MED ORDER — ZOLPIDEM TARTRATE 10 MG PO TABS: 10.0000 mg | ORAL_TABLET | Freq: Every evening | ORAL | Status: DC | PRN

## 2012-01-12 NOTE — Telephone Encounter (Signed)
Script called in

## 2012-01-12 NOTE — Telephone Encounter (Signed)
Pt called. Saw Dr. Clent Ridges on Wednesday. She states that he told her he would fax , call, or e-scribe in a rx for her for a sleeping med. She's used Ambien in the past. It has not been done, and I see nothing in the dictation about it. Please advise. Pt uses [facility] on [address]

## 2012-01-12 NOTE — Telephone Encounter (Signed)
Call in Zolpidem 10 mg qhs, #30 with 5 rf 

## 2012-01-15 ENCOUNTER — Other Ambulatory Visit (INDEPENDENT_AMBULATORY_CARE_PROVIDER_SITE_OTHER): Payer: Medicare Other

## 2012-01-15 ENCOUNTER — Telehealth: Payer: Self-pay | Admitting: Family Medicine

## 2012-01-15 DIAGNOSIS — E119 Type 2 diabetes mellitus without complications: Secondary | ICD-10-CM

## 2012-01-15 LAB — TSH: TSH: 0.76 u[IU]/mL (ref 0.35–5.50)

## 2012-01-15 LAB — CBC WITH DIFFERENTIAL/PLATELET
Basophils Absolute: 0 10*3/uL (ref 0.0–0.1)
Eosinophils Absolute: 0.2 10*3/uL (ref 0.0–0.7)
Lymphocytes Relative: 25.7 % (ref 12.0–46.0)
Monocytes Relative: 5.8 % (ref 3.0–12.0)
Platelets: 237 10*3/uL (ref 150.0–400.0)
RDW: 15.3 % — ABNORMAL HIGH (ref 11.5–14.6)

## 2012-01-15 LAB — BASIC METABOLIC PANEL
BUN: 16 mg/dL (ref 6–23)
CO2: 25 mEq/L (ref 19–32)
Chloride: 106 mEq/L (ref 96–112)
Glucose, Bld: 65 mg/dL — ABNORMAL LOW (ref 70–99)
Potassium: 3.8 mEq/L (ref 3.5–5.1)

## 2012-01-15 LAB — HEPATIC FUNCTION PANEL
ALT: 15 U/L (ref 0–35)
AST: 21 U/L (ref 0–37)
Albumin: 3.9 g/dL (ref 3.5–5.2)
Total Protein: 7.5 g/dL (ref 6.0–8.3)

## 2012-01-15 LAB — HEMOGLOBIN A1C: Hgb A1c MFr Bld: 8.7 % — ABNORMAL HIGH (ref 4.6–6.5)

## 2012-01-15 LAB — LIPID PANEL
Cholesterol: 134 mg/dL (ref 0–200)
VLDL: 16.6 mg/dL (ref 0.0–40.0)

## 2012-01-15 NOTE — Telephone Encounter (Signed)
Patient is having difficulty sleeping - she is requesting sleep medication.  Please advise patient

## 2012-01-15 NOTE — Telephone Encounter (Signed)
We took care of this last week  ?

## 2012-01-20 ENCOUNTER — Other Ambulatory Visit: Payer: Self-pay | Admitting: Family Medicine

## 2012-01-22 MED ORDER — METFORMIN HCL 500 MG PO TABS: 500.0000 mg | ORAL_TABLET | Freq: Two times a day (BID) | ORAL | Status: DC

## 2012-01-22 NOTE — Progress Notes (Signed)
Quick Note:  Spoke with pt and sent new script e-scribe. ______

## 2012-01-31 ENCOUNTER — Encounter: Payer: Self-pay | Admitting: Family Medicine

## 2012-01-31 ENCOUNTER — Ambulatory Visit (INDEPENDENT_AMBULATORY_CARE_PROVIDER_SITE_OTHER): Payer: Medicare Other | Admitting: Family Medicine

## 2012-01-31 VITALS — BP 140/90 | HR 103 | Temp 98.4°F

## 2012-01-31 DIAGNOSIS — E119 Type 2 diabetes mellitus without complications: Secondary | ICD-10-CM

## 2012-01-31 DIAGNOSIS — L821 Other seborrheic keratosis: Secondary | ICD-10-CM

## 2012-01-31 NOTE — Progress Notes (Signed)
  Subjective:    Patient ID: Melanie Mcdonald, female    DOB: Feb 18, 1933, 78 y.o.   MRN: [id number]  HPI Here for me to check a lesion under the left breast that has grown a bit larger in the past 3 months. It is not symptomatic. Also she admits to not starting the Metformin yet that we had prescribed. Her recent A1c was up to 8.7.    Review of Systems  Constitutional: Negative.   Respiratory: Negative.   Cardiovascular: Negative.        Objective:   Physical Exam  Constitutional: She appears well-developed and well-nourished.  Cardiovascular: Normal rate, regular rhythm, normal heart sounds and intact distal pulses.   Pulmonary/Chest: Effort normal and breath sounds normal.  Skin:       There is a large brown seborrheic  keratosis under the left breast           Assessment & Plan:  Reassured her that the seborrheic keratosis is benign and would never bother her. Encouraged her to get more exercise and to start taking Metformin as prescribed

## 2012-02-06 ENCOUNTER — Ambulatory Visit: Payer: Medicare Other | Admitting: Physical Therapy

## 2012-02-06 ENCOUNTER — Encounter: Payer: Self-pay | Admitting: Family Medicine

## 2012-02-06 ENCOUNTER — Ambulatory Visit (INDEPENDENT_AMBULATORY_CARE_PROVIDER_SITE_OTHER): Payer: Medicare Other | Admitting: Family Medicine

## 2012-02-06 ENCOUNTER — Ambulatory Visit: Payer: Medicare Other | Admitting: Family Medicine

## 2012-02-06 VITALS — BP 160/84 | HR 98 | Temp 98.3°F

## 2012-02-06 DIAGNOSIS — J40 Bronchitis, not specified as acute or chronic: Secondary | ICD-10-CM

## 2012-02-06 MED ORDER — HYDROCODONE-HOMATROPINE 5-1.5 MG/5ML PO SYRP: 5.0000 mL | ORAL_SOLUTION | ORAL | Status: DC | PRN

## 2012-02-06 MED ORDER — INSULIN GLARGINE 100 UNIT/ML ~~LOC~~ SOLN: 100.0000 [IU] | Freq: Every day | SUBCUTANEOUS | Status: DC

## 2012-02-06 MED ORDER — AZITHROMYCIN 250 MG PO TABS: ORAL_TABLET | ORAL | Status: AC

## 2012-02-06 NOTE — Progress Notes (Signed)
  Subjective:    Patient ID: Melanie Mcdonald, female    DOB: 05/04/1933, 78 y.o.   MRN: [id number]  HPI Here for 5 days of PND, ST, hoarseness, and a dry cough. Her chest is congested. No fever.    Review of Systems  Constitutional: Negative.   HENT: Positive for congestion, sore throat and postnasal drip.   Eyes: Negative.   Respiratory: Positive for cough.        Objective:   Physical Exam  Constitutional: She appears well-developed and well-nourished.  HENT:  Right Ear: External ear normal.  Left Ear: External ear normal.  Nose: Nose normal.  Mouth/Throat: Oropharynx is clear and moist.  Eyes: Conjunctivae are normal.  Neck: Neck supple. No thyromegaly present.  Pulmonary/Chest: Effort normal and breath sounds normal. No respiratory distress. She has no wheezes. She has no rales.  Lymphadenopathy:    She has no cervical adenopathy.          Assessment & Plan:  Rest, drink fluids

## 2012-02-07 ENCOUNTER — Telehealth: Payer: Self-pay | Admitting: *Deleted

## 2012-02-07 MED ORDER — ALBUTEROL SULFATE HFA 108 (90 BASE) MCG/ACT IN AERS: 2.0000 | INHALATION_SPRAY | RESPIRATORY_TRACT | Status: DC | PRN

## 2012-02-07 NOTE — Telephone Encounter (Signed)
Call in a Proair HFA inhaler, use 2 puffs q 4 hours prn SOB, #1 with 2 rf. Continue the Zpack

## 2012-02-07 NOTE — Telephone Encounter (Signed)
Pt is still congested and running a fever of 101.5 with productive cough.  Would like RX to help her breathe.

## 2012-02-07 NOTE — Telephone Encounter (Signed)
Proair HFA Inhaler, use 2 puffs q 4 hours prn SOB 1 with 2 refills, Contine Zpack.

## 2012-02-07 NOTE — Telephone Encounter (Signed)
Script sent e-scribe and spoke with pt. 

## 2012-02-08 ENCOUNTER — Telehealth: Payer: Self-pay | Admitting: *Deleted

## 2012-02-08 NOTE — Telephone Encounter (Signed)
Wrong chart

## 2012-02-09 ENCOUNTER — Telehealth: Payer: Self-pay | Admitting: Family Medicine

## 2012-02-09 ENCOUNTER — Ambulatory Visit: Payer: Medicare Other | Admitting: Physical Therapy

## 2012-02-09 NOTE — Telephone Encounter (Signed)
Actually it would be best to let her get over this respiratory infection before we try to do any PT

## 2012-02-09 NOTE — Telephone Encounter (Signed)
Can you put in another referral for Pt to check gait. Pt was unable to get this done today. It is only good for 30 days.

## 2012-02-13 ENCOUNTER — Other Ambulatory Visit: Payer: Self-pay | Admitting: Family Medicine

## 2012-02-14 NOTE — Telephone Encounter (Signed)
Call in another 240 ml bottle  

## 2012-02-22 ENCOUNTER — Ambulatory Visit: Payer: Medicare Other | Attending: Family Medicine | Admitting: Physical Therapy

## 2012-02-22 DIAGNOSIS — R269 Unspecified abnormalities of gait and mobility: Secondary | ICD-10-CM | POA: Insufficient documentation

## 2012-02-22 DIAGNOSIS — IMO0001 Reserved for inherently not codable concepts without codable children: Secondary | ICD-10-CM | POA: Insufficient documentation

## 2012-02-22 DIAGNOSIS — M6281 Muscle weakness (generalized): Secondary | ICD-10-CM | POA: Insufficient documentation

## 2012-02-23 ENCOUNTER — Encounter: Payer: Self-pay | Admitting: Family Medicine

## 2012-02-23 ENCOUNTER — Ambulatory Visit (INDEPENDENT_AMBULATORY_CARE_PROVIDER_SITE_OTHER)
Admission: RE | Admit: 2012-02-23 | Discharge: 2012-02-23 | Disposition: A | Discharge status: Home / Self Care | Payer: Medicare Other | Source: Ambulatory Visit | Attending: Family Medicine | Admitting: Family Medicine

## 2012-02-23 ENCOUNTER — Ambulatory Visit (INDEPENDENT_AMBULATORY_CARE_PROVIDER_SITE_OTHER): Payer: Medicare Other | Admitting: Family Medicine

## 2012-02-23 VITALS — BP 142/80 | HR 103 | Temp 98.2°F | Wt 228.0 lb

## 2012-02-23 DIAGNOSIS — R05 Cough: Secondary | ICD-10-CM

## 2012-02-23 DIAGNOSIS — R600 Localized edema: Secondary | ICD-10-CM

## 2012-02-23 DIAGNOSIS — R609 Edema, unspecified: Secondary | ICD-10-CM

## 2012-02-23 DIAGNOSIS — R059 Cough, unspecified: Secondary | ICD-10-CM

## 2012-02-23 MED ORDER — HYDROCODONE-HOMATROPINE 5-1.5 MG/5ML PO SYRP: 5.0000 mL | ORAL_SOLUTION | ORAL | Status: DC | PRN

## 2012-02-23 MED ORDER — METHYLPREDNISOLONE ACETATE 80 MG/ML IJ SUSP
120.0000 mg | Freq: Once | INTRAMUSCULAR | Status: AC
  Administered 2012-02-23: 120 mg via INTRAMUSCULAR

## 2012-02-23 MED ORDER — FUROSEMIDE 40 MG PO TABS: 40.0000 mg | ORAL_TABLET | Freq: Two times a day (BID) | ORAL | Status: DC

## 2012-02-23 NOTE — Progress Notes (Signed)
Addended by: Aniceto Boss A on: 02/23/2012 04:45 PM   Modules accepted: Orders

## 2012-02-23 NOTE — Progress Notes (Signed)
  Subjective:    Patient ID: Melanie Mcdonald, female    DOB: 02/13/1933, 78 y.o.   MRN: [id number]  HPI Here for one week of swelling in both feet and legs and some mild weight gain. No SOB. However she developed a hard dry cough 3 days ago. No fever or ST or sinus pressure. She had a normal Myoview stress test on 06-08-11. She had a normal BMET 4 weeks ago. We saw her here for a bronchitis on 02-06-12 which we treated with a Zpack. All her symptoms resolved within a few days.    Review of Systems  Constitutional: Negative.   HENT: Negative.   Eyes: Negative.   Respiratory: Positive for cough. Negative for shortness of breath and wheezing.   Cardiovascular: Positive for leg swelling. Negative for chest pain and palpitations.       Objective:   Physical Exam  Constitutional: She appears well-developed and well-nourished.  HENT:  Right Ear: External ear normal.  Left Ear: External ear normal.  Nose: Nose normal.  Mouth/Throat: Oropharynx is clear and moist. No oropharyngeal exudate.  Eyes: Conjunctivae are normal.  Neck: No thyromegaly present.  Cardiovascular: Normal rate, regular rhythm, normal heart sounds and intact distal pulses.  Exam reveals no gallop and no friction rub.   No murmur heard. Pulmonary/Chest: Effort normal and breath sounds normal. No respiratory distress. She has no wheezes. She has no rales.  Lymphadenopathy:    She has no cervical adenopathy.          Assessment & Plan:  She does not seem to have a bacterial etiology to her cough. Possibilities include allergies vs CHF vs viral. Given Hydromet for cough. Given a DepoMedrol shot. We will send her for a CXR today. Increase Lasix to bid

## 2012-02-23 NOTE — Progress Notes (Signed)
Quick Note:  I left voice message with results. ______ 

## 2012-02-28 ENCOUNTER — Encounter: Payer: Self-pay | Admitting: Family Medicine

## 2012-02-28 ENCOUNTER — Ambulatory Visit (INDEPENDENT_AMBULATORY_CARE_PROVIDER_SITE_OTHER): Payer: Medicare Other | Admitting: Family Medicine

## 2012-02-28 VITALS — BP 140/78 | HR 101 | Temp 98.4°F | Wt 228.0 lb

## 2012-02-28 DIAGNOSIS — J439 Emphysema, unspecified: Secondary | ICD-10-CM

## 2012-02-28 DIAGNOSIS — J438 Other emphysema: Secondary | ICD-10-CM

## 2012-02-28 DIAGNOSIS — R609 Edema, unspecified: Secondary | ICD-10-CM

## 2012-02-28 DIAGNOSIS — R600 Localized edema: Secondary | ICD-10-CM

## 2012-02-28 MED ORDER — HYDROCOD POLST-CHLORPHEN POLST 10-8 MG/5ML PO LQCR: 5.0000 mL | Freq: Two times a day (BID) | ORAL | Status: DC

## 2012-02-28 MED ORDER — PREDNISONE 10 MG PO TABS: 10.0000 mg | ORAL_TABLET | Freq: Every day | ORAL | Status: DC

## 2012-02-28 NOTE — Progress Notes (Signed)
  Subjective:    Patient ID: Melanie Mcdonald, female    DOB: 05/14/1933, 78 y.o.   MRN: [id number]  HPI Here to follow up on a cough and LE edema. She was here on 02-23-12 for these issues, and her CXR that day showed emphysema but no fluid. Her Lasix dose was doubled due to leg swelling. She was given a steroid shot that day. She feels better today with less cough, less SOB , and less weakness. She asks if we could try a low dose of steroids on a daily basis. As for the emphysema seen on the CXR, she says she has never smoked but she was exposed to smoke growing up as a child being around a father who smoked. Her father also had severe emphysema later in life.    Review of Systems  Constitutional: Positive for fatigue.  Respiratory: Positive for cough and shortness of breath. Negative for wheezing.   Cardiovascular: Positive for leg swelling. Negative for chest pain and palpitations.       Objective:   Physical Exam  Constitutional: She appears well-developed and well-nourished.       She looks better today than last time  Cardiovascular: Normal rate, regular rhythm, normal heart sounds and intact distal pulses.  Exam reveals no gallop and no friction rub.   No murmur heard. Pulmonary/Chest: Effort normal and breath sounds normal.  Musculoskeletal:       Trace edema in both feet           Assessment & Plan:  Try 10 mg of prednisone daily. We discussed her emphysema, and I told her the best way to deal with this is to improve her level of conditioning. She needs to lose a lot of weight and to exercise more.

## 2012-03-04 ENCOUNTER — Ambulatory Visit: Payer: Medicare Other | Attending: Family Medicine | Admitting: Physical Therapy

## 2012-03-04 DIAGNOSIS — M6281 Muscle weakness (generalized): Secondary | ICD-10-CM | POA: Insufficient documentation

## 2012-03-04 DIAGNOSIS — IMO0001 Reserved for inherently not codable concepts without codable children: Secondary | ICD-10-CM | POA: Insufficient documentation

## 2012-03-04 DIAGNOSIS — R269 Unspecified abnormalities of gait and mobility: Secondary | ICD-10-CM | POA: Insufficient documentation

## 2012-03-08 ENCOUNTER — Ambulatory Visit: Payer: Medicare Other | Admitting: Physical Therapy

## 2012-03-11 ENCOUNTER — Other Ambulatory Visit: Payer: Self-pay | Admitting: Family Medicine

## 2012-03-12 ENCOUNTER — Emergency Department (HOSPITAL_COMMUNITY)
Admission: EM | Admit: 2012-03-12 | Discharge: 2012-03-12 | Disposition: A | Discharge status: Home / Self Care | Payer: Medicare Other | Attending: Emergency Medicine | Admitting: Emergency Medicine

## 2012-03-12 ENCOUNTER — Ambulatory Visit: Payer: Medicare Other | Admitting: Physical Therapy

## 2012-03-12 ENCOUNTER — Encounter (HOSPITAL_COMMUNITY): Payer: Self-pay | Admitting: *Deleted

## 2012-03-12 DIAGNOSIS — Y92009 Unspecified place in unspecified non-institutional (private) residence as the place of occurrence of the external cause: Secondary | ICD-10-CM | POA: Insufficient documentation

## 2012-03-12 DIAGNOSIS — M129 Arthropathy, unspecified: Secondary | ICD-10-CM | POA: Insufficient documentation

## 2012-03-12 DIAGNOSIS — T1590XA Foreign body on external eye, part unspecified, unspecified eye, initial encounter: Secondary | ICD-10-CM | POA: Insufficient documentation

## 2012-03-12 DIAGNOSIS — S058X9A Other injuries of unspecified eye and orbit, initial encounter: Secondary | ICD-10-CM | POA: Insufficient documentation

## 2012-03-12 DIAGNOSIS — Y998 Other external cause status: Secondary | ICD-10-CM | POA: Insufficient documentation

## 2012-03-12 DIAGNOSIS — Z79899 Other long term (current) drug therapy: Secondary | ICD-10-CM | POA: Insufficient documentation

## 2012-03-12 DIAGNOSIS — K219 Gastro-esophageal reflux disease without esophagitis: Secondary | ICD-10-CM | POA: Insufficient documentation

## 2012-03-12 DIAGNOSIS — I10 Essential (primary) hypertension: Secondary | ICD-10-CM | POA: Insufficient documentation

## 2012-03-12 DIAGNOSIS — E119 Type 2 diabetes mellitus without complications: Secondary | ICD-10-CM | POA: Insufficient documentation

## 2012-03-12 DIAGNOSIS — S0500XA Injury of conjunctiva and corneal abrasion without foreign body, unspecified eye, initial encounter: Secondary | ICD-10-CM

## 2012-03-12 DIAGNOSIS — Y93H9 Activity, other involving exterior property and land maintenance, building and construction: Secondary | ICD-10-CM | POA: Insufficient documentation

## 2012-03-12 DIAGNOSIS — Z8673 Personal history of transient ischemic attack (TIA), and cerebral infarction without residual deficits: Secondary | ICD-10-CM | POA: Insufficient documentation

## 2012-03-12 DIAGNOSIS — J4489 Other specified chronic obstructive pulmonary disease: Secondary | ICD-10-CM | POA: Insufficient documentation

## 2012-03-12 DIAGNOSIS — Z794 Long term (current) use of insulin: Secondary | ICD-10-CM | POA: Insufficient documentation

## 2012-03-12 DIAGNOSIS — D649 Anemia, unspecified: Secondary | ICD-10-CM | POA: Insufficient documentation

## 2012-03-12 DIAGNOSIS — J449 Chronic obstructive pulmonary disease, unspecified: Secondary | ICD-10-CM | POA: Insufficient documentation

## 2012-03-12 DIAGNOSIS — E785 Hyperlipidemia, unspecified: Secondary | ICD-10-CM | POA: Insufficient documentation

## 2012-03-12 DIAGNOSIS — I2510 Atherosclerotic heart disease of native coronary artery without angina pectoris: Secondary | ICD-10-CM | POA: Insufficient documentation

## 2012-03-12 MED ORDER — PROPARACAINE HCL 0.5 % OP SOLN
1.0000 [drp] | Freq: Once | OPHTHALMIC | Status: AC
  Administered 2012-03-12: 1 [drp] via OPHTHALMIC

## 2012-03-12 MED ORDER — HYDROCODONE-ACETAMINOPHEN 5-325 MG PO TABS: 2.0000 | ORAL_TABLET | ORAL | Status: AC | PRN

## 2012-03-12 MED ORDER — SULFACETAMIDE SODIUM 10 % OP SOLN: 1.0000 [drp] | Freq: Four times a day (QID) | OPHTHALMIC | Status: AC

## 2012-03-12 NOTE — ED Provider Notes (Signed)
History     CSN: [id number]  Arrival date & time 03/12/12  2032   First MD Initiated Contact with Patient 03/12/12 2134      Chief Complaint  Patient presents with  . Eye Injury    (Consider location/radiation/quality/duration/timing/severity/associated sxs/prior treatment) HPI Comments: Patient reports that just prior to arrival she was mowing her lawn when a rock came up and hit her left eye.  She is now having pain of the left eye and watery discharge.   Eye sensitive to light.  She reports that the vision in her left eye is slightly blurry.  She has had cataract surgery of both of her eyes in October.  Last tetanus was in October 2012.  Patient is a 78 y.o. female presenting with eye injury. The history is provided by the patient.  Eye Injury This is a new problem. The current episode started today. The problem has been unchanged. Pertinent negatives include no chills, fever, headaches, nausea or vomiting. She has tried nothing for the symptoms.    Past Medical History  Diagnosis Date  . ANEMIA 01/30/2008  . ARTHRITIS 12/11/2007  . BACK PAIN, CHRONIC 06/23/2008  . BREAST CYST, RIGHT 12/17/2007  . CONTACT DERMATITIS 03/10/2009  . CORONARY ARTERY DISEASE 03/01/2007  . CYSTOCELE WITHOUT MENTION UTERINE PROLAPSE LAT 09/12/2010  . DEGENERATIVE JOINT DISEASE 08/07/2007  . DEPRESSION 03/01/2007  . DIABETES MELLITUS, TYPE II 08/26/2008  . Esophageal reflux 11/06/2007  . HYPERKERATOSIS 06/02/2009  . HYPERLIPIDEMIA 08/26/2008  . HYPERTENSION 03/01/2007  . Irritable bowel syndrome 01/26/2009  . LABYRINTHITIS 11/03/2009  . Narcolepsy without cataplexy 08/26/2008  . OBESITY 03/01/2007  . PERIPHERAL VASCULAR DISEASE 01/26/2009  . SYNCOPE 08/26/2008  . TRANSIENT ISCHEMIC ATTACK 10/20/2009  . NEPHROLITHIASIS 04/29/2009  . COPD (chronic obstructive pulmonary disease)     Past Surgical History  Procedure Date  . Cataract extraction   . Abdominal hysterectomy   . Back surgery     x2  .  Tonsilectomy, adenoidectomy, bilateral myringotomy and tubes   . Spine surgery     x 3  . Cardiac catheterization 07/2009    Family History  Problem Relation Age of Onset  . Lupus Mother   . COPD Father     History  Substance Use Topics  . Smoking status: Never Smoker   . Smokeless tobacco: Never Used  . Alcohol Use: No    OB History    Grav Para Term Preterm Abortions TAB SAB Ect Mult Living                  Review of Systems  Constitutional: Negative for fever and chills.  Eyes: Positive for photophobia, pain, redness and visual disturbance.  Gastrointestinal: Negative for nausea and vomiting.  Skin: Negative for color change.  Neurological: Negative for dizziness, syncope, light-headedness and headaches.    Allergies  Doxycycline; Erythromycin; Penicillins; and Sulfonamide derivatives  Home Medications   Current Outpatient Rx  Name Route Sig Dispense Refill  . ALBUTEROL SULFATE HFA 108 (90 BASE) MCG/ACT IN AERS Inhalation Inhale 2 puffs into the lungs every 4 (four) hours as needed for wheezing. 1 Inhaler 2  . HYDROCOD POLST-CPM POLST ER 10-8 MG/5ML PO LQCR Oral Take 5 mLs by mouth every 12 (twelve) hours. 240 mL 0  . CLOPIDOGREL BISULFATE 75 MG PO TABS  TAKE 1 TABLET BY MOUTH DAILY 30 tablet 0  . CYCLOBENZAPRINE HCL 10 MG PO TABS Oral Take 1 tablet (10 mg total) by mouth 3 (three) times daily  as needed for muscle spasms. Muscle spasm 60 tablet 11  . DEXTROAMPHETAMINE SULFATE ER 10 MG PO CP24 Oral Take 20 mg by mouth daily as needed. For narcolepsy.    Marland Kitchen DICLOFENAC SODIUM 1 % TD GEL Topical Apply 1 application topically daily. For back/shoulder pain. 100 g 11  . GLIPIZIDE ER 10 MG PO TB24 Oral Take 1 tablet (10 mg total) by mouth daily. 30 tablet 11  . HYDROCODONE-HOMATROPINE 5-1.5 MG/5ML PO SYRP Oral Take 5 mLs by mouth every 4 (four) hours as needed for cough. 240 mL 0  . INSULIN GLARGINE 100 UNIT/ML [place] SOLN Subcutaneous Inject 100 Units into the skin at bedtime.  30 pen 3  . METFORMIN HCL 500 MG PO TABS Oral Take 1 tablet (500 mg total) by mouth 2 (two) times daily with a meal. 60 tablet 11  . NITROGLYCERIN 0.6 MG SL SUBL Sublingual Place 1 tablet (0.6 mg total) under the tongue every 5 (five) minutes as needed. 90 tablet 11  . NYSTATIN 100000 UNIT/ML MT SUSP Oral Take 2 mLs (200,000 Units total) by mouth 4 (four) times daily. 240 mL 11  . OXYCODONE-ACETAMINOPHEN 10-325 MG PO TABS Oral Take 1 tablet by mouth every 6 (six) hours as needed for pain. pain 120 tablet 0  . PRAMIPEXOLE DIHYDROCHLORIDE 1 MG PO TABS Oral Take 3 mg by mouth at bedtime.    Marland Kitchen PREDNISONE 10 MG PO TABS Oral Take 1 tablet (10 mg total) by mouth daily. 30 tablet 11  . PROMETHAZINE HCL 25 MG PO TABS Oral Take 25 mg by mouth every 6 (six) hours as needed. For nausea.    Marland Kitchen ZOLPIDEM TARTRATE 10 MG PO TABS Oral Take 1 tablet (10 mg total) by mouth at bedtime as needed for sleep. 30 tablet 5    BP 157/82  Pulse 101  Temp(Src) 98.1 F (36.7 C) (Oral)  Resp 20  Wt 220 lb (99.791 kg)  SpO2 99%  Physical Exam  Nursing note and vitals reviewed. Constitutional: She appears well-developed and well-nourished.       Uncomfortable appearing  HENT:  Head: Normocephalic and atraumatic.  Mouth/Throat: Oropharynx is clear and moist.  Eyes: EOM are normal. Pupils are equal, round, and reactive to light. No foreign bodies found. No foreign body present in the right eye. No foreign body present in the left eye. Right conjunctiva is not injected. Left conjunctiva is injected.  Slit lamp exam:      The left eye shows corneal abrasion and fluorescein uptake.       IOP 22 as measured with tonopen in left eye IOP 25 in right eye  Neck: Normal range of motion. Neck supple.  Cardiovascular: Normal rate, regular rhythm and normal heart sounds.   Pulmonary/Chest: Effort normal and breath sounds normal.  Neurological: She is alert.  Skin: Skin is warm, dry and intact. No abrasion and no bruising noted.  She is not diaphoretic.  Psychiatric: She has a normal mood and affect.    ED Course  Procedures (including critical care time)  Labs Reviewed - No data to display No results found.   No diagnosis found.    MDM  Patient with corneal abrasion visualized on exam with fluorescein stain.  Patient given short course of pain medication.  Patient also given antibiotic drops and instructed to follow up with Opthalmologist.          Magnus Sinning, PA-C 03/13/12 2128

## 2012-03-12 NOTE — ED Notes (Signed)
Pt mowing and rock hit left eye; cataract surgery both eyes; states everything is blurry; cornea red but no obvious defect noted to eye

## 2012-03-12 NOTE — Discharge Instructions (Signed)
Corneal Abrasion The cornea is the clear covering at the front and center of the eye. When looking at the colored portion (iris) of the eye, you are looking through that person's cornea.  This very thin tissue is made up of many layers. The surface layer is a single layer of cells called the corneal epithelium. This is one of the most sensitive tissues in the body. If a scratch or injury causes the corneal epithelium to come off, it is called a corneal abrasion. If the injury extends to the tissues below the epithelium, the condition is called a corneal ulcer.  CAUSES   Scratches.   Trauma.   Foreign body in the eye.   Some people have recurrences of abrasions in the area of the original injury even after they heal. This is called recurrent erosion syndrome. Recurrent erosion syndromes generally improve and go away with time.  SYMPTOMS   Eye pain.   Difficulty or inability to keep the injured eye open.   The eye becomes very sensitive to light.   Recurrent erosions tend to happen suddenly, first thing in the morning - usually upon awakening and opening the eyes.  DIAGNOSIS  Your eye professional can diagnose a corneal abrasion during an eye exam. Dye is usually placed in the eye using a drop or a small paper strip moistened by the patient's tears. When the eye is examined with a special light, the abrasion shows up clearly because of the dye. TREATMENT   Small abrasions may be treated with antibiotic drops or ointment alone.   Usually a pressure patch is specially applied. Pressure patches prevent the eye from blinking, allowing the corneal epithelium to heal. Because blinking is less, a pressure patch also reduces the amount of pain present in the eye during healing. Most corneal abrasions heal within 2-3 days with no effect on vision. WARNING: Do not drive or operate machinery while your eye is patched. Your ability to judge distances is impaired.   If abrasion becomes infected and  spreads to the deeper tissues of the cornea, a corneal ulcer can result. This is serious because it can cause corneal scarring. Corneal scars interfere with light passing through the cornea, and cause a loss of vision in the involved eye.   If your caregiver has given you a follow-up appointment, it is very important to keep that appointment. Not keeping the appointment could result in a severe eye infection or permanent loss of vision. If there is any problem keeping the appointment, you must call back to this facility for assistance.  SEEK MEDICAL CARE IF:   You have pain, light sensitivity and a scratchy feeling in one eye (or both).   Your pressure patch keeps loosening up and you can blink your eye under the patch after treatment.   Any kind of discharge develops from the involved eye after treatment or if the lids stick together in the morning.   You have the same symptoms in the morning as you did with the original abrasion days, weeks or months after the abrasion healed.  MAKE SURE YOU:   Understand these instructions.   Will watch your condition.   Will get help right away if you are not doing well or get worse.  Document Released: 09/15/2000 Document Revised: 09/07/2011 Document Reviewed: 04/23/2008 [employer] Patient Information 2012 [employer]. 

## 2012-03-14 ENCOUNTER — Encounter: Payer: Medicare Other | Admitting: Physical Therapy

## 2012-03-15 NOTE — ED Provider Notes (Signed)
Medical screening examination/treatment/procedure(s) were conducted as a shared visit with non-physician practitioner(s) and myself.  I personally evaluated the patient during the encounter.  78 year old female with left eye pain. She was struck in her left eye by a rock earlier today. Acute onset pain, blurred vision, photophobia and tearing. Exam is consistent for corneal abrasion. No evidence of globe perforation. Patient denies using contact lenses. Plan course of antibiotics and pain medication. Prompt ophthalmology followup. Return precautions were discussed  Raeford Razor, MD 03/15/12 [phone]

## 2012-03-19 ENCOUNTER — Ambulatory Visit: Payer: Medicare Other | Admitting: Physical Therapy

## 2012-03-19 ENCOUNTER — Encounter: Payer: Medicare Other | Admitting: Physical Therapy

## 2012-03-22 ENCOUNTER — Encounter: Payer: Medicare Other | Admitting: Physical Therapy

## 2012-03-26 ENCOUNTER — Encounter: Payer: Medicare Other | Admitting: Physical Therapy

## 2012-03-28 ENCOUNTER — Encounter: Payer: Medicare Other | Admitting: Physical Therapy

## 2012-04-10 ENCOUNTER — Encounter: Payer: Medicare Other | Admitting: Physical Therapy

## 2012-04-12 ENCOUNTER — Ambulatory Visit: Payer: Medicare Other | Attending: Family Medicine | Admitting: Physical Therapy

## 2012-04-12 DIAGNOSIS — R269 Unspecified abnormalities of gait and mobility: Secondary | ICD-10-CM | POA: Insufficient documentation

## 2012-04-12 DIAGNOSIS — IMO0001 Reserved for inherently not codable concepts without codable children: Secondary | ICD-10-CM | POA: Insufficient documentation

## 2012-04-12 DIAGNOSIS — M6281 Muscle weakness (generalized): Secondary | ICD-10-CM | POA: Insufficient documentation

## 2012-04-29 ENCOUNTER — Telehealth: Payer: Self-pay | Admitting: Family Medicine

## 2012-04-29 NOTE — Telephone Encounter (Signed)
Melanie Mcdonald; DOB 05-May-1933; Call back number: [phone]; Melanie Salisbury.; Pt calling and states that she has an appt on 05/03/12 and she wants to make sure that she will be able to "see" the xray of her chest that she had a couple of months ago; she wants to view the xray on Friday at her appt

## 2012-04-30 NOTE — Telephone Encounter (Signed)
Can you call pt and give the below message? 

## 2012-04-30 NOTE — Telephone Encounter (Signed)
Yes I can pull this up

## 2012-05-03 ENCOUNTER — Ambulatory Visit (INDEPENDENT_AMBULATORY_CARE_PROVIDER_SITE_OTHER): Payer: Medicare Other | Admitting: Family Medicine

## 2012-05-03 ENCOUNTER — Encounter: Payer: Self-pay | Admitting: Family Medicine

## 2012-05-03 VITALS — BP 138/76 | HR 102 | Temp 98.3°F

## 2012-05-03 DIAGNOSIS — F329 Major depressive disorder, single episode, unspecified: Secondary | ICD-10-CM

## 2012-05-03 DIAGNOSIS — G47419 Narcolepsy without cataplexy: Secondary | ICD-10-CM

## 2012-05-03 DIAGNOSIS — M549 Dorsalgia, unspecified: Secondary | ICD-10-CM

## 2012-05-03 DIAGNOSIS — F3289 Other specified depressive episodes: Secondary | ICD-10-CM

## 2012-05-03 DIAGNOSIS — I2510 Atherosclerotic heart disease of native coronary artery without angina pectoris: Secondary | ICD-10-CM

## 2012-05-03 DIAGNOSIS — M129 Arthropathy, unspecified: Secondary | ICD-10-CM

## 2012-05-03 MED ORDER — FUROSEMIDE 40 MG PO TABS: 40.0000 mg | ORAL_TABLET | Freq: Every day | ORAL | Status: DC

## 2012-05-03 MED ORDER — OXYCODONE-ACETAMINOPHEN 10-325 MG PO TABS: 1.0000 | ORAL_TABLET | Freq: Four times a day (QID) | ORAL | Status: DC | PRN

## 2012-05-03 MED ORDER — METHYLPREDNISOLONE ACETATE 80 MG/ML IJ SUSP
120.0000 mg | Freq: Once | INTRAMUSCULAR | Status: AC
  Administered 2012-05-03: 120 mg via INTRAMUSCULAR

## 2012-05-03 MED ORDER — DEXTROAMPHETAMINE SULFATE ER 10 MG PO CP24: 20.0000 mg | ORAL_CAPSULE | Freq: Every day | ORAL | Status: DC

## 2012-05-03 NOTE — Addendum Note (Signed)
Addended by: Aniceto Boss A on: 05/03/2012 09:36 AM   Modules accepted: Orders

## 2012-05-03 NOTE — Progress Notes (Signed)
  Subjective:    Patient ID: Melanie Mcdonald, female    DOB: September 05, 1933, 78 y.o.   MRN: [id number]  HPI Here to follow up on chronic back pain and narcolepsy. She had been getting PT for conditioning and gait stability. This was helpful but now she is finished. She needs more pain meds. She only takes her narcolepsy meds on occasion, but she feels sluggish almost every day.    Review of Systems  Constitutional: Positive for fatigue.  Respiratory: Negative.   Cardiovascular: Negative.   Musculoskeletal: Positive for back pain and arthralgias.       Objective:   Physical Exam  Constitutional: She is oriented to person, place, and time. She appears well-developed and well-nourished.  Cardiovascular: Normal rate, regular rhythm, normal heart sounds and intact distal pulses.   Pulmonary/Chest: Effort normal and breath sounds normal.  Neurological: She is alert and oriented to person, place, and time.          Assessment & Plan:  Refilled meds. Given a steroid shot. I suggested she take the Dextroamphetamine every day.

## 2012-05-08 ENCOUNTER — Telehealth: Payer: Self-pay | Admitting: Family Medicine

## 2012-05-08 NOTE — Telephone Encounter (Signed)
Please call her pharmacy to get more details about this. I can't just simply rewrite something like this

## 2012-05-08 NOTE — Telephone Encounter (Signed)
Patient called stating that she was given an rx for percocet at her visit on Friday and she knows she took it to the pharmacy and when she returned to pick them up they told her that she never dropped it off with the other rxs and will need it printed again by her PCP. Please advise.

## 2012-05-09 ENCOUNTER — Telehealth: Payer: Self-pay | Admitting: *Deleted

## 2012-05-09 ENCOUNTER — Other Ambulatory Visit: Payer: Self-pay | Admitting: *Deleted

## 2012-05-09 MED ORDER — OXYCODONE-ACETAMINOPHEN 10-325 MG PO TABS: 1.0000 | ORAL_TABLET | Freq: Four times a day (QID) | ORAL | Status: DC | PRN

## 2012-05-09 NOTE — Telephone Encounter (Signed)
Tell her I wrote another rx for the Percocet, but she needs to be more careful with this. If this one is lost, I will NOT write for any more

## 2012-05-09 NOTE — Telephone Encounter (Signed)
Pt found her percocet rx

## 2012-05-09 NOTE — Telephone Encounter (Addendum)
I called pharmacy and pharmacist at [facility] stated that pt never gave them the rx and pt told them that she lost the rx.  Called pt and told pt to call the police and report it lost or stolen and bring police report to Dr Clent Ridges.  Pt said she would check her house one more time for the rx and if she still can't find it she will call the police

## 2012-05-09 NOTE — Telephone Encounter (Signed)
Pt is aware nurse will call her back °

## 2012-05-09 NOTE — Telephone Encounter (Signed)
Opened in error

## 2012-05-09 NOTE — Telephone Encounter (Signed)
Script is ready for pick up and I spoke with pt.  

## 2012-05-09 NOTE — Telephone Encounter (Signed)
Sheriff Katrinka Blazing called me and explained that the pt has lost the rx and never had it filled so therefore there is not a report that needs to be filed.  Pt has already called back and I told pt that the [facility] called and Dr Clent Ridges was aware.  We would let her know what to do when Dr Clent Ridges tells me.

## 2012-05-16 ENCOUNTER — Other Ambulatory Visit: Payer: Self-pay | Admitting: Family Medicine

## 2012-05-27 ENCOUNTER — Encounter: Payer: Self-pay | Admitting: Family Medicine

## 2012-05-27 ENCOUNTER — Ambulatory Visit (INDEPENDENT_AMBULATORY_CARE_PROVIDER_SITE_OTHER): Payer: Medicare Other | Admitting: Family Medicine

## 2012-05-27 VITALS — BP 160/90 | Temp 98.5°F

## 2012-05-27 DIAGNOSIS — R296 Repeated falls: Secondary | ICD-10-CM

## 2012-05-27 DIAGNOSIS — Z9181 History of falling: Secondary | ICD-10-CM

## 2012-05-27 DIAGNOSIS — H532 Diplopia: Secondary | ICD-10-CM

## 2012-05-27 DIAGNOSIS — R300 Dysuria: Secondary | ICD-10-CM

## 2012-05-27 LAB — POCT URINALYSIS DIPSTICK
Blood, UA: NEGATIVE
Nitrite, UA: POSITIVE
Urobilinogen, UA: 1

## 2012-05-27 MED ORDER — CIPROFLOXACIN HCL 500 MG PO TABS: 500.0000 mg | ORAL_TABLET | Freq: Two times a day (BID) | ORAL | Status: DC

## 2012-05-27 NOTE — Patient Instructions (Addendum)

## 2012-05-27 NOTE — Progress Notes (Signed)
Subjective:    Patient ID: Melanie Mcdonald, female    DOB: July 08, 1933, 78 y.o.   MRN: [id number]  HPI  Acute visit. Patient seen with the following issues  Dysuria with burning with urination past few days. Intermittent right flank pain. No nausea or vomiting. No fever. History of intolerance to penicillin and sulfa.  Frequent falls over the past 6 months. She's had physical therapy without improvement. She frequently loses balance. She's also complaining of some diplopia which is binocular. Intermittent diplopia for 2 years. No recent increase in headaches. She has fallen and hit her head a couple times.  She has history of type 2 diabetes. She has some intermittent numbness on the bottoms of both feet. And does notice some weakness with right dorsi flexion.  She has history of prior low back surgery  Dealing with tremendous stress. 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  Past Medical History  Diagnosis Date  . ANEMIA 01/30/2008  . ARTHRITIS 12/11/2007  . BACK PAIN, CHRONIC 06/23/2008  . BREAST CYST, RIGHT 12/17/2007  . CONTACT DERMATITIS 03/10/2009  . CORONARY ARTERY DISEASE 03/01/2007  . CYSTOCELE WITHOUT MENTION UTERINE PROLAPSE LAT 09/12/2010  . DEGENERATIVE JOINT DISEASE 08/07/2007  . DEPRESSION 03/01/2007  . DIABETES MELLITUS, TYPE II 08/26/2008  . Esophageal reflux 11/06/2007  . HYPERKERATOSIS 06/02/2009  . HYPERLIPIDEMIA 08/26/2008  . HYPERTENSION 03/01/2007  . Irritable bowel syndrome 01/26/2009  . LABYRINTHITIS 11/03/2009  . Narcolepsy without cataplexy 08/26/2008  . OBESITY 03/01/2007  . PERIPHERAL VASCULAR DISEASE 01/26/2009  . SYNCOPE 08/26/2008  . TRANSIENT ISCHEMIC ATTACK 10/20/2009  . NEPHROLITHIASIS 04/29/2009  . COPD (chronic obstructive pulmonary disease)    Past Surgical History  Procedure Date  . Cataract extraction   . Abdominal hysterectomy   . Back surgery     x2  . Tonsilectomy, adenoidectomy, bilateral  myringotomy and tubes   . Spine surgery     x 3  . Cardiac catheterization 07/2009    reports that she has never smoked. She has never used smokeless tobacco. She reports that she does not drink alcohol or use illicit drugs. family history includes COPD in her father and Lupus in her mother. Allergies  Allergen Reactions  . Doxycycline Itching  . Erythromycin Nausea And Vomiting  . Penicillins Swelling  . Sulfonamide Derivatives Nausea And Vomiting      Review of Systems  Eyes: Positive for visual disturbance. Negative for pain.  Respiratory: Negative for shortness of breath.   Cardiovascular: Negative for chest pain.  Genitourinary: Positive for dysuria. Negative for hematuria.  Skin: Negative for rash.  Neurological: Negative for dizziness, seizures, syncope, light-headedness and headaches.  Psychiatric/Behavioral: Negative for confusion.       Objective:   Physical Exam  Constitutional: She is oriented to person, place, and time. She appears well-developed and well-nourished.  Eyes: Pupils are equal, round, and reactive to light.  Neck: Neck supple. No thyromegaly present.  Cardiovascular: Normal rate and regular rhythm.   Pulmonary/Chest: Effort normal and breath sounds normal. No respiratory distress. She has no wheezes. She has no rales.  Musculoskeletal: She exhibits no edema.  Neurological: She is alert and oriented to person, place, and time. No cranial nerve deficit.       Only trace reflex knee and ankle bilaterally. She has mild weakness with right dorsi flexion compared to left. Mild central impairment ventral surface of both feet  No focal weakness.  Extraocular movements  all intact          Assessment & Plan:  #1 probable UTI. Urine culture sent. Cipro 500 mg twice a day pending culture results. Plenty of fluids. #2 frequent falls. Question related to peripheral neuropathy from her diabetes. She appears to be developing probably early right foot drop.  May need eventually ankle brace for support  #3 binocular diplopia.. No evidence for myasthenia. Consider neurology referral.  She is describing ?intermittent ataxia type symptoms but normal cerebellar function at this time.  No obvious cranial nerve defects at this time.

## 2012-05-29 ENCOUNTER — Telehealth: Payer: Self-pay | Admitting: Family Medicine

## 2012-05-29 LAB — URINE CULTURE

## 2012-05-29 MED ORDER — NITROFURANTOIN MONOHYD MACRO 100 MG PO CAPS: 100.0000 mg | ORAL_CAPSULE | Freq: Two times a day (BID) | ORAL | Status: DC

## 2012-05-29 NOTE — Telephone Encounter (Signed)
Patient calling, she was seen on Monday and dx with a UTI.  Started on Cipro 500 1 po bid.  Having vomiting this am.   Also having diarrhea today.  Has not taken her daily medication today due to the new onset of vomiting.  Denes any fever or additional sx since the office visiti.  Asking if the antibiotic can be changed without an OV?  Uses [facility] on [address].

## 2012-05-29 NOTE — Telephone Encounter (Signed)
Stop the Cipro and start on Macrobid 100 mg bid for 7 days

## 2012-05-29 NOTE — Telephone Encounter (Signed)
I spoke with pt and sent script e-scribe. 

## 2012-06-05 ENCOUNTER — Encounter (HOSPITAL_COMMUNITY): Payer: Self-pay | Admitting: *Deleted

## 2012-06-05 ENCOUNTER — Emergency Department (HOSPITAL_COMMUNITY)
Admission: EM | Admit: 2012-06-05 | Discharge: 2012-06-05 | Disposition: A | Discharge status: Home / Self Care | Payer: Medicare Other | Attending: Emergency Medicine | Admitting: Emergency Medicine

## 2012-06-05 ENCOUNTER — Emergency Department (HOSPITAL_COMMUNITY): Payer: Medicare Other

## 2012-06-05 ENCOUNTER — Telehealth: Payer: Self-pay | Admitting: Family Medicine

## 2012-06-05 DIAGNOSIS — Z8673 Personal history of transient ischemic attack (TIA), and cerebral infarction without residual deficits: Secondary | ICD-10-CM | POA: Insufficient documentation

## 2012-06-05 DIAGNOSIS — F419 Anxiety disorder, unspecified: Secondary | ICD-10-CM

## 2012-06-05 DIAGNOSIS — I10 Essential (primary) hypertension: Secondary | ICD-10-CM | POA: Insufficient documentation

## 2012-06-05 DIAGNOSIS — R4182 Altered mental status, unspecified: Secondary | ICD-10-CM

## 2012-06-05 DIAGNOSIS — Z794 Long term (current) use of insulin: Secondary | ICD-10-CM | POA: Insufficient documentation

## 2012-06-05 DIAGNOSIS — G4700 Insomnia, unspecified: Secondary | ICD-10-CM

## 2012-06-05 DIAGNOSIS — F329 Major depressive disorder, single episode, unspecified: Secondary | ICD-10-CM | POA: Insufficient documentation

## 2012-06-05 DIAGNOSIS — F411 Generalized anxiety disorder: Secondary | ICD-10-CM | POA: Insufficient documentation

## 2012-06-05 DIAGNOSIS — F3289 Other specified depressive episodes: Secondary | ICD-10-CM | POA: Insufficient documentation

## 2012-06-05 DIAGNOSIS — E119 Type 2 diabetes mellitus without complications: Secondary | ICD-10-CM | POA: Insufficient documentation

## 2012-06-05 DIAGNOSIS — Z79899 Other long term (current) drug therapy: Secondary | ICD-10-CM | POA: Insufficient documentation

## 2012-06-05 LAB — GLUCOSE, CAPILLARY: Glucose-Capillary: 87 mg/dL (ref 70–99)

## 2012-06-05 LAB — URINALYSIS, ROUTINE W REFLEX MICROSCOPIC
Protein, ur: NEGATIVE mg/dL
Specific Gravity, Urine: 1.009 (ref 1.005–1.030)
Urobilinogen, UA: 0.2 mg/dL (ref 0.0–1.0)

## 2012-06-05 LAB — CBC WITH DIFFERENTIAL/PLATELET
Basophils Relative: 0 % (ref 0–1)
Eosinophils Absolute: 0.3 10*3/uL (ref 0.0–0.7)
Hemoglobin: 13.8 g/dL (ref 12.0–15.0)
Lymphs Abs: 2.8 10*3/uL (ref 0.7–4.0)
MCH: 29.4 pg (ref 26.0–34.0)
MCHC: 33.6 g/dL (ref 30.0–36.0)
Monocytes Relative: 4 % (ref 3–12)
Neutro Abs: 6.3 10*3/uL (ref 1.7–7.7)
Neutrophils Relative %: 64 % (ref 43–77)
Platelets: 272 10*3/uL (ref 150–400)
RBC: 4.7 MIL/uL (ref 3.87–5.11)

## 2012-06-05 LAB — BASIC METABOLIC PANEL
BUN: 13 mg/dL (ref 6–23)
CO2: 28 mEq/L (ref 19–32)
Chloride: 100 mEq/L (ref 96–112)
Creatinine, Ser: 0.55 mg/dL (ref 0.50–1.10)
Glucose, Bld: 113 mg/dL — ABNORMAL HIGH (ref 70–99)
Potassium: 3.6 mEq/L (ref 3.5–5.1)

## 2012-06-05 LAB — URINE MICROSCOPIC-ADD ON

## 2012-06-05 NOTE — Telephone Encounter (Signed)
ER CALL. Caller: [name]/Patient; Patient Name: Melanie Mcdonald; PCP: Evelena Peat [facility]); Best Callback Phone Number: [phone]; Calling regarding Blacked out and confusion, onset 9-3.  Caretaker informed Patient, Patient got out of bed around 8am on 9-3, naked with sheet wraped around Neck and Body, fell into TV, table, lost control of bladder control x2, Patient was talking about her Mother, who has been dead for years.  Patient is alert and oriented currently. Patient complains of headache for 5 days.   Patient fell into Shower door 7 days ago.  Patient states she falls alot.  Head Injury Protocol, ED disposition due to persistent headache over 48 hours post falling and hiting head on shower door.  Advised patient to be seen at emergency room, Patient refuses ED, wants to be seen by Dr Caryl Never, unable to come in at 1145 or 1200 to make available appointment with Dr Tawanna Cooler. No other appointmens available.  Please call Patient back if able to fit in.
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
## 2012-06-05 NOTE — Telephone Encounter (Signed)
Per Dr. Clent Ridges called pt to make aware that he suggest pt be seen at the ER.  Pt states she understands and has a friend that will take her in about 30 minutes.

## 2012-06-05 NOTE — ED Notes (Signed)
Pt CBG 66, given sandwich, crackers, and drink, will recheck after eating.

## 2012-06-07 LAB — URINE CULTURE

## 2012-06-14 ENCOUNTER — Ambulatory Visit: Payer: Medicare Other | Admitting: Family Medicine

## 2012-06-18 ENCOUNTER — Ambulatory Visit (INDEPENDENT_AMBULATORY_CARE_PROVIDER_SITE_OTHER): Payer: Medicare Other | Admitting: Family Medicine

## 2012-06-18 ENCOUNTER — Encounter: Payer: Self-pay | Admitting: Family Medicine

## 2012-06-18 VITALS — BP 140/88 | HR 106 | Temp 98.4°F | Wt 236.0 lb

## 2012-06-18 DIAGNOSIS — Z9181 History of falling: Secondary | ICD-10-CM

## 2012-06-18 DIAGNOSIS — R4182 Altered mental status, unspecified: Secondary | ICD-10-CM

## 2012-06-18 DIAGNOSIS — I6381 Other cerebral infarction due to occlusion or stenosis of small artery: Secondary | ICD-10-CM

## 2012-06-18 DIAGNOSIS — R309 Painful micturition, unspecified: Secondary | ICD-10-CM

## 2012-06-18 DIAGNOSIS — R296 Repeated falls: Secondary | ICD-10-CM

## 2012-06-18 DIAGNOSIS — I6350 Cerebral infarction due to unspecified occlusion or stenosis of unspecified cerebral artery: Secondary | ICD-10-CM

## 2012-06-18 DIAGNOSIS — R300 Dysuria: Secondary | ICD-10-CM

## 2012-06-18 LAB — POCT URINALYSIS DIPSTICK
Ketones, UA: NEGATIVE
Nitrite, UA: NEGATIVE
Urobilinogen, UA: 0.2
pH, UA: 5

## 2012-06-18 MED ORDER — DEXTROAMPHETAMINE SULFATE ER 10 MG PO CP24: 20.0000 mg | ORAL_CAPSULE | Freq: Every day | ORAL | Status: DC | PRN

## 2012-06-18 NOTE — Progress Notes (Signed)
  Subjective:    Patient ID: Melanie Mcdonald, female    DOB: Jul 02, 1933, 78 y.o.   MRN: [id number]  HPI Here to follow up an ER visit on 06-05-12 for altered mental status. She has had periods of confusion and frequent falls over the past few months. A CT scan of her head that day showed a new small lacunar infarct. She has had dizzy spells and has had numerous falls over the past 6 months. She has also had some double vision which comes and goes. She was treated for UTI symptoms a few weeks ago with Macrobid, and the symptoms went away. These included urinary urgency and burning. Her culture at that time did not grow any bacteria. Now these symptoms have returned. No nausea or fever. Lab work in the ER was all normal.    Review of Systems  Eyes: Positive for visual disturbance.  Respiratory: Negative.   Cardiovascular: Negative.   Neurological: Positive for dizziness, syncope and weakness. Negative for tremors, seizures, facial asymmetry, speech difficulty, light-headedness, numbness and headaches.       Objective:   Physical Exam  Constitutional: She is oriented to person, place, and time. She appears well-developed and well-nourished.       Walks slowly with a cane   Cardiovascular: Normal rate, regular rhythm, normal heart sounds and intact distal pulses.   Pulmonary/Chest: Effort normal and breath sounds normal.  Neurological: She is alert and oriented to person, place, and time. She has normal reflexes. No cranial nerve deficit. She exhibits normal muscle tone. Coordination normal.  Psychiatric: She has a normal mood and affect. Her behavior is normal. Thought content normal.          Assessment & Plan:  She has had periods of altered mental status and numerous falls, now with evidence of an ischemic event on a head CT. These events could be small strokes , TIAs, seizures, etc. We will refer her to Neurology ASAP for evaluation.

## 2012-07-11 ENCOUNTER — Telehealth: Payer: Self-pay | Admitting: Family Medicine

## 2012-07-11 NOTE — Telephone Encounter (Signed)
Pt requesting refill of Oxycodone 10/325 #120

## 2012-07-12 MED ORDER — OXYCODONE-ACETAMINOPHEN 10-325 MG PO TABS: 1.0000 | ORAL_TABLET | Freq: Four times a day (QID) | ORAL | Status: DC | PRN

## 2012-07-12 NOTE — Telephone Encounter (Signed)
done

## 2012-07-12 NOTE — Telephone Encounter (Signed)
Script is ready for pick up and I left a voice message.  

## 2012-07-24 ENCOUNTER — Ambulatory Visit (INDEPENDENT_AMBULATORY_CARE_PROVIDER_SITE_OTHER): Payer: Medicare Other | Admitting: Family Medicine

## 2012-07-24 ENCOUNTER — Encounter: Payer: Self-pay | Admitting: Family Medicine

## 2012-07-24 VITALS — BP 128/72 | HR 103 | Temp 98.1°F

## 2012-07-24 DIAGNOSIS — I10 Essential (primary) hypertension: Secondary | ICD-10-CM

## 2012-07-24 DIAGNOSIS — I2510 Atherosclerotic heart disease of native coronary artery without angina pectoris: Secondary | ICD-10-CM

## 2012-07-24 DIAGNOSIS — G47419 Narcolepsy without cataplexy: Secondary | ICD-10-CM

## 2012-07-24 DIAGNOSIS — E119 Type 2 diabetes mellitus without complications: Secondary | ICD-10-CM

## 2012-07-24 DIAGNOSIS — G459 Transient cerebral ischemic attack, unspecified: Secondary | ICD-10-CM

## 2012-07-24 DIAGNOSIS — M129 Arthropathy, unspecified: Secondary | ICD-10-CM

## 2012-07-24 MED ORDER — ZOLPIDEM TARTRATE 10 MG PO TABS: 10.0000 mg | ORAL_TABLET | Freq: Every day | ORAL | Status: DC

## 2012-07-24 MED ORDER — METHYLPREDNISOLONE ACETATE 80 MG/ML IJ SUSP
120.0000 mg | Freq: Once | INTRAMUSCULAR | Status: AC
  Administered 2012-07-24: 120 mg via INTRAMUSCULAR

## 2012-07-24 MED ORDER — DEXTROAMPHETAMINE SULFATE ER 10 MG PO CP24: 20.0000 mg | ORAL_CAPSULE | Freq: Every day | ORAL | Status: DC

## 2012-07-24 MED ORDER — FUROSEMIDE 20 MG PO TABS: 20.0000 mg | ORAL_TABLET | Freq: Every day | ORAL | Status: DC

## 2012-07-24 NOTE — Progress Notes (Signed)
  Subjective:    Patient ID: Melanie Mcdonald, female    DOB: Dec 23, 1932, 78 y.o.   MRN: [id number]  HPI Here to follow up on narcolepsy, insomnia, and frequent falls. She needs some refills. She saw Dr. Pearlean Brownie last month for dizziness and falls, and her workup has thus far been unrevealing. She has had a brain MRI and MRA which were unremarkable. She had carotid dopplers yesterday but we do not have results of these yet.    Review of Systems  Respiratory: Negative.   Cardiovascular: Negative.   Neurological: Positive for dizziness, weakness and light-headedness. Negative for tremors, seizures, syncope, facial asymmetry, speech difficulty, numbness and headaches.       Objective:   Physical Exam  Constitutional: She appears well-developed and well-nourished.  Neck: No thyromegaly present.  Cardiovascular: Normal rate, regular rhythm, normal heart sounds and intact distal pulses.   Pulmonary/Chest: Effort normal and breath sounds normal.  Lymphadenopathy:    She has no cervical adenopathy.          Assessment & Plan:  She will follow up at Dr. Marlis Edelson office tomorrow. Refilled her meds.

## 2012-07-24 NOTE — Addendum Note (Signed)
Addended by: [name] A on: 07/24/2012 09:45 AM   Modules accepted: Orders

## 2012-07-24 NOTE — Addendum Note (Signed)
Addended by: Aniceto Boss A on: 07/24/2012 10:52 AM   Modules accepted: Orders

## 2012-07-26 ENCOUNTER — Ambulatory Visit (INDEPENDENT_AMBULATORY_CARE_PROVIDER_SITE_OTHER): Payer: Medicare Other | Admitting: Family Medicine

## 2012-07-26 ENCOUNTER — Encounter: Payer: Self-pay | Admitting: Family Medicine

## 2012-07-26 VITALS — BP 140/90 | Temp 98.0°F

## 2012-07-26 DIAGNOSIS — L0390 Cellulitis, unspecified: Secondary | ICD-10-CM

## 2012-07-26 MED ORDER — CEPHALEXIN 500 MG PO CAPS: 500.0000 mg | ORAL_CAPSULE | Freq: Three times a day (TID) | ORAL | Status: AC
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
## 2012-07-27 ENCOUNTER — Other Ambulatory Visit: Payer: Self-pay | Admitting: Family Medicine

## 2012-07-31 NOTE — Telephone Encounter (Signed)
Call in #30 with 5 rf 

## 2012-08-23 ENCOUNTER — Other Ambulatory Visit: Payer: Self-pay | Admitting: Family Medicine

## 2012-08-23 NOTE — Telephone Encounter (Addendum)
Pt called and said that her glipiZIDE (GLUCOTROL XL) 10 MG 24 hr tablet should be take 2 pills by mouth daily.  #60. Pls correct the script and call it in to [id number] - [place], [place] - [address] AT [facility] [address]    Also pt said that [facility] did not rcv the script for zolpidem (AMBIEN) 10 MG tablet that was date on 07/24/12. Pls call in to pharmacy.

## 2012-08-23 NOTE — Telephone Encounter (Signed)
Okay for 6 months 

## 2012-08-24 ENCOUNTER — Other Ambulatory Visit: Payer: Self-pay | Admitting: Family Medicine

## 2012-08-26 MED ORDER — ZOLPIDEM TARTRATE 10 MG PO TABS: 10.0000 mg | ORAL_TABLET | Freq: Every day | ORAL | Status: DC

## 2012-08-26 MED ORDER — GLIPIZIDE ER 10 MG PO TB24: 10.0000 mg | ORAL_TABLET | Freq: Two times a day (BID) | ORAL | Status: DC

## 2012-08-26 NOTE — Telephone Encounter (Signed)
I sent script for Glipizide e-scribe and called in the Ambien.

## 2012-09-02 ENCOUNTER — Ambulatory Visit (INDEPENDENT_AMBULATORY_CARE_PROVIDER_SITE_OTHER)
Admission: RE | Admit: 2012-09-02 | Discharge: 2012-09-02 | Disposition: A | Discharge status: Home / Self Care | Payer: Medicare Other | Source: Ambulatory Visit | Attending: Family Medicine | Admitting: Family Medicine

## 2012-09-02 ENCOUNTER — Ambulatory Visit (INDEPENDENT_AMBULATORY_CARE_PROVIDER_SITE_OTHER): Payer: Medicare Other | Admitting: Family Medicine

## 2012-09-02 ENCOUNTER — Encounter: Payer: Self-pay | Admitting: Family Medicine

## 2012-09-02 VITALS — BP 148/90 | HR 104 | Temp 98.4°F

## 2012-09-02 DIAGNOSIS — S20219A Contusion of unspecified front wall of thorax, initial encounter: Secondary | ICD-10-CM

## 2012-09-02 MED ORDER — TRAMADOL HCL 50 MG PO TABS: 50.0000 mg | ORAL_TABLET | Freq: Four times a day (QID) | ORAL | Status: DC | PRN

## 2012-09-02 MED ORDER — OXYCODONE-ACETAMINOPHEN 10-325 MG PO TABS: 1.0000 | ORAL_TABLET | Freq: Four times a day (QID) | ORAL | Status: DC | PRN
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
## 2012-09-03 NOTE — Progress Notes (Signed)
Quick Note:  I spoke with pt ______ 

## 2012-09-09 ENCOUNTER — Emergency Department (HOSPITAL_COMMUNITY)
Admission: EM | Admit: 2012-09-09 | Discharge: 2012-09-10 | Disposition: A | Discharge status: Home / Self Care | Payer: Medicare Other | Attending: Emergency Medicine | Admitting: Emergency Medicine

## 2012-09-09 ENCOUNTER — Emergency Department (HOSPITAL_COMMUNITY): Payer: Medicare Other

## 2012-09-09 ENCOUNTER — Telehealth: Payer: Self-pay | Admitting: Family Medicine

## 2012-09-09 ENCOUNTER — Encounter (HOSPITAL_COMMUNITY): Payer: Self-pay | Admitting: *Deleted

## 2012-09-09 DIAGNOSIS — L259 Unspecified contact dermatitis, unspecified cause: Secondary | ICD-10-CM | POA: Insufficient documentation

## 2012-09-09 DIAGNOSIS — R51 Headache: Secondary | ICD-10-CM | POA: Insufficient documentation

## 2012-09-09 DIAGNOSIS — H8309 Labyrinthitis, unspecified ear: Secondary | ICD-10-CM | POA: Insufficient documentation

## 2012-09-09 DIAGNOSIS — Y9289 Other specified places as the place of occurrence of the external cause: Secondary | ICD-10-CM | POA: Insufficient documentation

## 2012-09-09 DIAGNOSIS — D649 Anemia, unspecified: Secondary | ICD-10-CM | POA: Insufficient documentation

## 2012-09-09 DIAGNOSIS — M129 Arthropathy, unspecified: Secondary | ICD-10-CM | POA: Insufficient documentation

## 2012-09-09 DIAGNOSIS — E119 Type 2 diabetes mellitus without complications: Secondary | ICD-10-CM | POA: Insufficient documentation

## 2012-09-09 DIAGNOSIS — K219 Gastro-esophageal reflux disease without esophagitis: Secondary | ICD-10-CM | POA: Insufficient documentation

## 2012-09-09 DIAGNOSIS — J449 Chronic obstructive pulmonary disease, unspecified: Secondary | ICD-10-CM | POA: Insufficient documentation

## 2012-09-09 DIAGNOSIS — L851 Acquired keratosis [keratoderma] palmaris et plantaris: Secondary | ICD-10-CM | POA: Insufficient documentation

## 2012-09-09 DIAGNOSIS — Z79899 Other long term (current) drug therapy: Secondary | ICD-10-CM | POA: Insufficient documentation

## 2012-09-09 DIAGNOSIS — K589 Irritable bowel syndrome without diarrhea: Secondary | ICD-10-CM | POA: Insufficient documentation

## 2012-09-09 DIAGNOSIS — I10 Essential (primary) hypertension: Secondary | ICD-10-CM | POA: Insufficient documentation

## 2012-09-09 DIAGNOSIS — G8929 Other chronic pain: Secondary | ICD-10-CM | POA: Insufficient documentation

## 2012-09-09 DIAGNOSIS — E669 Obesity, unspecified: Secondary | ICD-10-CM | POA: Insufficient documentation

## 2012-09-09 DIAGNOSIS — I739 Peripheral vascular disease, unspecified: Secondary | ICD-10-CM | POA: Insufficient documentation

## 2012-09-09 DIAGNOSIS — R55 Syncope and collapse: Secondary | ICD-10-CM

## 2012-09-09 DIAGNOSIS — N8112 Cystocele, lateral: Secondary | ICD-10-CM | POA: Insufficient documentation

## 2012-09-09 DIAGNOSIS — G47419 Narcolepsy without cataplexy: Secondary | ICD-10-CM | POA: Insufficient documentation

## 2012-09-09 DIAGNOSIS — E785 Hyperlipidemia, unspecified: Secondary | ICD-10-CM | POA: Insufficient documentation

## 2012-09-09 DIAGNOSIS — F329 Major depressive disorder, single episode, unspecified: Secondary | ICD-10-CM | POA: Insufficient documentation

## 2012-09-09 DIAGNOSIS — S0990XA Unspecified injury of head, initial encounter: Secondary | ICD-10-CM | POA: Insufficient documentation

## 2012-09-09 DIAGNOSIS — Y9389 Activity, other specified: Secondary | ICD-10-CM | POA: Insufficient documentation

## 2012-09-09 DIAGNOSIS — S199XXA Unspecified injury of neck, initial encounter: Secondary | ICD-10-CM | POA: Insufficient documentation

## 2012-09-09 DIAGNOSIS — N6009 Solitary cyst of unspecified breast: Secondary | ICD-10-CM | POA: Insufficient documentation

## 2012-09-09 DIAGNOSIS — S0993XA Unspecified injury of face, initial encounter: Secondary | ICD-10-CM | POA: Insufficient documentation

## 2012-09-09 DIAGNOSIS — M542 Cervicalgia: Secondary | ICD-10-CM | POA: Insufficient documentation

## 2012-09-09 DIAGNOSIS — I2510 Atherosclerotic heart disease of native coronary artery without angina pectoris: Secondary | ICD-10-CM | POA: Insufficient documentation

## 2012-09-09 DIAGNOSIS — J4489 Other specified chronic obstructive pulmonary disease: Secondary | ICD-10-CM | POA: Insufficient documentation

## 2012-09-09 DIAGNOSIS — Z8742 Personal history of other diseases of the female genital tract: Secondary | ICD-10-CM | POA: Insufficient documentation

## 2012-09-09 DIAGNOSIS — M1990 Unspecified osteoarthritis, unspecified site: Secondary | ICD-10-CM | POA: Insufficient documentation

## 2012-09-09 DIAGNOSIS — M549 Dorsalgia, unspecified: Secondary | ICD-10-CM | POA: Insufficient documentation

## 2012-09-09 DIAGNOSIS — F3289 Other specified depressive episodes: Secondary | ICD-10-CM | POA: Insufficient documentation

## 2012-09-09 DIAGNOSIS — Z87442 Personal history of urinary calculi: Secondary | ICD-10-CM | POA: Insufficient documentation

## 2012-09-09 DIAGNOSIS — Z794 Long term (current) use of insulin: Secondary | ICD-10-CM | POA: Insufficient documentation

## 2012-09-09 DIAGNOSIS — W1809XA Striking against other object with subsequent fall, initial encounter: Secondary | ICD-10-CM | POA: Insufficient documentation

## 2012-09-09 DIAGNOSIS — Z8673 Personal history of transient ischemic attack (TIA), and cerebral infarction without residual deficits: Secondary | ICD-10-CM | POA: Insufficient documentation

## 2012-09-09 DIAGNOSIS — Z872 Personal history of diseases of the skin and subcutaneous tissue: Secondary | ICD-10-CM | POA: Insufficient documentation

## 2012-09-09 DIAGNOSIS — Z9181 History of falling: Secondary | ICD-10-CM | POA: Insufficient documentation

## 2012-09-09 LAB — URINALYSIS, ROUTINE W REFLEX MICROSCOPIC
Bilirubin Urine: NEGATIVE
Hgb urine dipstick: NEGATIVE
Nitrite: NEGATIVE
Protein, ur: NEGATIVE mg/dL
Urobilinogen, UA: 0.2 mg/dL (ref 0.0–1.0)

## 2012-09-09 LAB — CBC WITH DIFFERENTIAL/PLATELET
Basophils Absolute: 0 10*3/uL (ref 0.0–0.1)
Basophils Relative: 0 % (ref 0–1)
Eosinophils Absolute: 0.2 10*3/uL (ref 0.0–0.7)
Eosinophils Relative: 2 % (ref 0–5)
Lymphs Abs: 2.7 10*3/uL (ref 0.7–4.0)
MCH: 29 pg (ref 26.0–34.0)
MCHC: 33.4 g/dL (ref 30.0–36.0)
MCV: 86.8 fL (ref 78.0–100.0)
Neutrophils Relative %: 62 % (ref 43–77)
Platelets: 227 10*3/uL (ref 150–400)
RDW: 13.6 % (ref 11.5–15.5)

## 2012-09-09 LAB — BASIC METABOLIC PANEL
Calcium: 9.3 mg/dL (ref 8.4–10.5)
GFR calc Af Amer: >90 mL/min (ref >90)
GFR calc non Af Amer: 84 mL/min — ABNORMAL LOW (ref >90)
Glucose, Bld: 164 mg/dL — ABNORMAL HIGH (ref 70–99)
Sodium: 139 mEq/L (ref 135–145)

## 2012-09-09 LAB — URINE MICROSCOPIC-ADD ON

## 2012-09-09 MED ORDER — OXYCODONE-ACETAMINOPHEN 5-325 MG PO TABS
1.0000 | ORAL_TABLET | Freq: Once | ORAL | Status: AC
  Administered 2012-09-09: 1 via ORAL
  Filled 2012-09-09: qty 1

## 2012-09-09 MED ORDER — SODIUM CHLORIDE 0.9 % IV BOLUS (SEPSIS)
500.0000 mL | Freq: Once | INTRAVENOUS | Status: AC
  Administered 2012-09-09: 500 mL via INTRAVENOUS

## 2012-09-09 MED ORDER — DEXTROSE 5 % IV SOLN
1.0000 g | Freq: Once | INTRAVENOUS | Status: AC
  Administered 2012-09-09: 1 g via INTRAVENOUS
  Filled 2012-09-09: qty 10

## 2012-09-09 NOTE — ED Provider Notes (Signed)
History     CSN: [id number]  Arrival date & time 09/09/12  4098   First MD Initiated Contact with Patient 09/09/12 2040      Chief Complaint  Patient presents with  . Loss of Consciousness  . Neck Pain    (Consider location/radiation/quality/duration/timing/severity/associated sxs/prior treatment) HPI History provided by pt and prior chart.  Pt had two unwitnessed syncopal episodes w/out any sort of prodrome 5 days ago.  Larey Seat so hard in her kitchen that she broke the counter.  Had a headache today, which was typical in nature, and has also had gradually improving neck pain, worst on right side and aggravated by movement.  Denies extremity pain/paresthesias/weakness.  Denies low back, chest and abdominal pain.  No recent illnesses including fever, cough, vomiting, diarrhea, urinary sx.  Has not had CP recently but has had worse than baseline SOB, attributed to emphysema.  No recent change in medication.  Pt has been evaluated by [facility] for syncope.  Has had 8-9 syncopal episodes in the past year, though she feels that they are occuring more frequently now.  Had a negative MRI/MRA in 06/2012 and has carotid dopplers pending.  Has been diagnosed w/ narcolepsy .  Only takes medication when she knows she'll be driving.   Past Medical History  Diagnosis Date  . ANEMIA 01/30/2008  . ARTHRITIS 12/11/2007  . BACK PAIN, CHRONIC 06/23/2008  . BREAST CYST, RIGHT 12/17/2007  . CONTACT DERMATITIS 03/10/2009  . CORONARY ARTERY DISEASE 03/01/2007    had a normal Myoview stress test 07-13-11  . CYSTOCELE WITHOUT MENTION UTERINE PROLAPSE LAT 09/12/2010  . DEGENERATIVE JOINT DISEASE 08/07/2007  . DEPRESSION 03/01/2007  . DIABETES MELLITUS, TYPE II 08/26/2008  . Esophageal reflux 11/06/2007  . HYPERKERATOSIS 06/02/2009  . HYPERLIPIDEMIA 08/26/2008  . HYPERTENSION 03/01/2007  . Irritable bowel syndrome 01/26/2009  . LABYRINTHITIS 11/03/2009  . Narcolepsy without cataplexy 08/26/2008  . OBESITY 03/01/2007  .  PERIPHERAL VASCULAR DISEASE 01/26/2009  . SYNCOPE 08/26/2008    had brain MRI on 06-28-12 showing only chronic microvascular ischemia and atrophy   . TRANSIENT ISCHEMIC ATTACK 10/20/2009    had normal brain MRA with patent vertebrals and carotids 06-28-12  . NEPHROLITHIASIS 04/29/2009  . COPD (chronic obstructive pulmonary disease)     Past Surgical History  Procedure Date  . Cataract extraction   . Abdominal hysterectomy   . Back surgery     x2  . Tonsilectomy, adenoidectomy, bilateral myringotomy and tubes   . Spine surgery     x 3  . Cardiac catheterization 07/2009    Family History  Problem Relation Age of Onset  . Lupus Mother   . COPD Father     History  Substance Use Topics  . Smoking status: Never Smoker   . Smokeless tobacco: Never Used  . Alcohol Use: No    OB History    Grav Para Term Preterm Abortions TAB SAB Ect Mult Living                  Review of Systems  All other systems reviewed and are negative.    Allergies  Doxycycline; Erythromycin; Penicillins; and Sulfonamide derivatives  Home Medications   Current Outpatient Rx  Name  Route  Sig  Dispense  Refill  . ALBUTEROL SULFATE HFA 108 (90 BASE) MCG/ACT IN AERS   Inhalation   Inhale 2 puffs into the lungs every 4 (four) hours as needed for wheezing.   1 Inhaler   2   .  CLOPIDOGREL BISULFATE 75 MG PO TABS   Oral   Take 75 mg by mouth daily.         . CYCLOBENZAPRINE HCL 10 MG PO TABS   Oral   Take 1 tablet (10 mg total) by mouth 3 (three) times daily as needed for muscle spasms. Muscle spasm   60 tablet   11   . DEXTROAMPHETAMINE SULFATE ER 10 MG PO CP24   Oral   Take 20 mg by mouth daily with breakfast. As needed for narcolepsy.         Marland Kitchen DICLOFENAC SODIUM 1 % TD GEL   Topical   Apply 4 g topically 4 (four) times daily as needed. For joint pain.         . FUROSEMIDE 20 MG PO TABS   Oral   Take 1 tablet (20 mg total) by mouth daily.   30 tablet   11   . GLIPIZIDE ER 10  MG PO TB24   Oral   Take 1 tablet (10 mg total) by mouth 2 (two) times daily.   60 tablet   11   . INSULIN GLARGINE 100 UNIT/ML [place] SOLN   Subcutaneous   Inject 100 Units into the skin at bedtime.   30 pen   3   . LOSARTAN POTASSIUM-HCTZ 100-25 MG PO TABS   Oral   Take 1 tablet by mouth daily.         Marland Kitchen METFORMIN HCL 500 MG PO TABS   Oral   Take 1 tablet (500 mg total) by mouth 2 (two) times daily with a meal.   60 tablet   11   . OXYCODONE-ACETAMINOPHEN 10-325 MG PO TABS   Oral   Take 1 tablet by mouth every 6 (six) hours as needed for pain. pain   120 tablet   0     May fill on 11-03-12   . PRAMIPEXOLE DIHYDROCHLORIDE 1 MG PO TABS   Oral   Take 3 mg by mouth at bedtime.         Marland Kitchen PROMETHAZINE HCL 25 MG PO TABS   Oral   Take 25 mg by mouth every 6 (six) hours as needed. For nausea.         . SERTRALINE HCL 100 MG PO TABS   Oral   Take 100 mg by mouth 2 (two) times daily.         . TRAMADOL HCL 50 MG PO TABS   Oral   Take 50 mg by mouth every 6 (six) hours as needed. Pain         . ZOLPIDEM TARTRATE 10 MG PO TABS   Oral   Take 1 tablet (10 mg total) by mouth at bedtime.   30 tablet   5     BP 121/65  Pulse 105  Temp 98.5 F (36.9 C) (Oral)  Resp 16  SpO2 98%  Physical Exam  Nursing note and vitals reviewed. Constitutional: She is oriented to person, place, and time. She appears well-developed and well-nourished. No distress.  HENT:  Head: Normocephalic and atraumatic.       Dry mucous membranes  Eyes:       Normal appearance  Neck: Normal range of motion.  Cardiovascular: Normal rate, regular rhythm and intact distal pulses.   Pulmonary/Chest: Effort normal and breath sounds normal.  Abdominal: Soft. Bowel sounds are normal. She exhibits no distension.       Mild tenderness at center of abdomen  Genitourinary:  Mild R CVA ttp w/ overlying ecchymosis that pt attributes to previous fall.  Denies hematuria.   Musculoskeletal: Normal  range of motion.       Tenderness L trap and mid-line C5-C6.   Neurological: She is alert and oriented to person, place, and time. No sensory deficit. Coordination normal.       CN 3-12 intact.  No nystagmus. 5/5 and equal upper and lower extremity strength.  No past pointing.     Skin: Skin is warm and dry. No rash noted.  Psychiatric: She has a normal mood and affect. Her behavior is normal.    ED Course  Procedures (including critical care time)   Labs Reviewed  CBC WITH DIFFERENTIAL  BASIC METABOLIC PANEL  URINALYSIS, ROUTINE W REFLEX MICROSCOPIC   Dg Cervical Spine Complete  09/09/2012  *RADIOLOGY REPORT*  Clinical Data: Fall 4 days ago.  Neck pain.  CERVICAL SPINE - COMPLETE 4+ VIEW  Comparison: None.  Findings: The odontoid appears intact.  Predental space is within normal limits.  Prevertebral soft tissues are within normal limits as well.  The cervicothoracic junction is not adequately visualized despite attempted swimmer's view.  No gross malalignment at the cervicothoracic junction.  Ankylosis of C5-C6.  C4-C5 and C6-C7 degenerative disc disease is present.  Left-sided foraminal encroachment is present at C4-C5, C5-C6 and C6-C7.  Right neural foramina appear patent.  IMPRESSION: No acute osseous abnormality identified.  Inadequate visualization of the cervicothoracic junction.  Mid to lower cervical spondylosis with ankylosis at C5-C6.   Original Report Authenticated By: Andreas Newport, M.D.      No diagnosis found.    MDM  78yo F w/ h/o narcolepsy and frequent falls, presents w/ neck pain, s/p 2 syncopal episodes w/out prodrome 5 days ago.  Take plavix for h/o CVA.  Has had several syncopal episodes over the past year, has been evaluated by her PCP and neuro, recent MRI/MRA neg and carotid dopplers pending.  Pt reports that they have been more frequent recently.  Non-productive cough x 3 weeks and worse than baseline SOB (h/o emphysema) but otherwise no recent illnesses or  medication changes.  Afebrile, head atraumatic, mid-line cervical spine tenderness, dry mucous membranes, no focal neuro deficits.  CT head and cervical spine neg.  CXR to r/o pneumonia pending.  Labs, including troponin are unremarkable w/ exception of UTI.  Pt receiving IV rocephin and fluids as well as po percocet for pain.  10:44 PM   Neck pain improved, but in the last hour, pt has developed a headache.  She attributes to hunger.  Will feed and give another percocet.  CXR neg.  All results discussed w/ patient.  D/c'd home w/ keflex for UTI.  Recommended f/u with PCP and neuro.  Return precautions discussed.        Otilio Miu, PA-C 09/10/12 [phone]

## 2012-09-09 NOTE — Telephone Encounter (Signed)
Patient Information:  Caller Name: Marlen  Phone: [phone]  Patient: Melanie, Mcdonald  Gender: Female  DOB: 04-17-1933  Age: 78 Years  PCP: [name] [address])   Symptoms  Reason For Call & Symptoms: Pt fell x 2 09/06/12 and hitnher head, on times she was unconscious for several hours.  Had pain 09/07/12 and was unable to move her head 09/08/12.  It is a little better today..  Asking Does she need to see a Dr. today?  Reviewed Health History In EMR: Yes  Reviewed Medications In EMR: Yes  Reviewed Allergies In EMR: Yes  Reviewed Surgeries / Procedures: No  Date of Onset of Symptoms: 09/06/2012  Guideline(s) Used:  Head Injury  Disposition Per Guideline:   Call EMS 911 Now  Reason For Disposition Reached:   Neck pain after dangerous injury (e.g., MVA, diving, trampoline, contact sports, fall > 10 feet, 305 cm) (Exception: neck pain began > 1 hour after injury)  Advice Given:  N/A  Office Follow Up:  Does the office need to follow up with this patient?: No  Instructions For The Office: N/A  Patient Refused Recommendation:  Patient Will Go To ED  She will get a friend to take her.  She will go to [facility].

## 2012-09-09 NOTE — ED Notes (Signed)
Pt states had two black out spells Friday night; falling and hitting head on cabinet; since then c/o increased neck pain; states woke up Saturday am and couldn't turn head; denies any c/o distress prior to passing out

## 2012-09-10 MED ORDER — CEPHALEXIN 250 MG PO CAPS: 250.0000 mg | ORAL_CAPSULE | Freq: Four times a day (QID) | ORAL | Status: DC

## 2012-09-10 MED ORDER — OXYCODONE-ACETAMINOPHEN 5-325 MG PO TABS
1.0000 | ORAL_TABLET | Freq: Once | ORAL | Status: DC
  Filled 2012-09-10: qty 1

## 2012-09-10 NOTE — ED Notes (Signed)
Discharge instructions reviewed w/ pt., verbalizes understanding. No prescriptions provided at discharge. 

## 2012-09-10 NOTE — ED Notes (Signed)
[facility] pa-c reports pt left w/o prescription for keflex; need to verify with pt which pharmacy she wants med called to in the am.

## 2012-09-10 NOTE — Telephone Encounter (Signed)
Pt went to ED. Encounter closed.

## 2012-09-11 LAB — URINE CULTURE
Colony Count: NO GROWTH
Culture: NO GROWTH

## 2012-09-11 NOTE — ED Provider Notes (Signed)
  I performed a history and physical examination of Melanie Mcdonald and discussed her management with Florentina Addison schinlever  I agree with the history, physical, assessment, and plan of care, with the following exceptions: None Elderly female with syncopal episode x two who fell and hit counter  She is complaining of neck pain    I was present for the following procedures: None Time Spent in Critical Care of the patient: None Time spent in discussions with the patient and family:   Holli Humbles, MD 09/11/12 1120

## 2012-09-12 ENCOUNTER — Encounter: Payer: Self-pay | Admitting: Family Medicine

## 2012-09-12 ENCOUNTER — Ambulatory Visit (INDEPENDENT_AMBULATORY_CARE_PROVIDER_SITE_OTHER): Payer: Medicare Other | Admitting: Family Medicine

## 2012-09-12 ENCOUNTER — Telehealth: Payer: Self-pay | Admitting: Family Medicine

## 2012-09-12 VITALS — BP 144/80 | HR 94 | Temp 97.9°F | Wt 235.0 lb

## 2012-09-12 DIAGNOSIS — F411 Generalized anxiety disorder: Secondary | ICD-10-CM

## 2012-09-12 DIAGNOSIS — R55 Syncope and collapse: Secondary | ICD-10-CM

## 2012-09-12 DIAGNOSIS — F419 Anxiety disorder, unspecified: Secondary | ICD-10-CM

## 2012-09-12 MED ORDER — ALBUTEROL SULFATE HFA 108 (90 BASE) MCG/ACT IN AERS: 2.0000 | INHALATION_SPRAY | RESPIRATORY_TRACT | Status: DC | PRN

## 2012-09-12 MED ORDER — ALPRAZOLAM 0.5 MG PO TABS: 0.5000 mg | ORAL_TABLET | Freq: Three times a day (TID) | ORAL | Status: DC | PRN

## 2012-09-12 NOTE — Progress Notes (Signed)
  Subjective:    Patient ID: Melanie Mcdonald, female    DOB: Dec 09, 1932, 78 y.o.   MRN: [id number]  HPI Here to follow up an ER visit on 09-09-12 for a syncopal episode and a fall at home. No apparent injuries. Xrays were negative. She has done well since then. No prodrome or other symptoms at all. She has had 9-10 such syncopal episodes in the past 6 months. She saw Neurology for this, but they could not find an etiology.    Review of Systems  Constitutional: Negative.   Respiratory: Negative.   Cardiovascular: Negative.   Neurological: Negative.        Objective:   Physical Exam  Constitutional: She is oriented to person, place, and time. She appears well-developed and well-nourished.  Cardiovascular: Normal rate, regular rhythm, normal heart sounds and intact distal pulses.  Exam reveals no gallop and no friction rub.   No murmur heard. Pulmonary/Chest: Effort normal and breath sounds normal. No respiratory distress. She has no wheezes. She has no rales.  Neurological: She is alert and oriented to person, place, and time. No cranial nerve deficit.          Assessment & Plan:  She is quite anxious so we will try some Xanax for her to use. She has used this successfully in the past. Refer to Cardiology to evaluate the syncope.

## 2012-09-12 NOTE — Telephone Encounter (Signed)
Error

## 2012-10-09 ENCOUNTER — Telehealth: Payer: Self-pay | Admitting: Family Medicine

## 2012-10-09 NOTE — Telephone Encounter (Signed)
Opened in error

## 2012-10-14 ENCOUNTER — Ambulatory Visit: Payer: Medicare Other | Admitting: Cardiology

## 2012-10-15 ENCOUNTER — Ambulatory Visit (INDEPENDENT_AMBULATORY_CARE_PROVIDER_SITE_OTHER): Payer: Medicare Other | Admitting: Family Medicine

## 2012-10-15 ENCOUNTER — Encounter: Payer: Self-pay | Admitting: Family Medicine

## 2012-10-15 VITALS — BP 146/90 | HR 94 | Temp 98.0°F

## 2012-10-15 DIAGNOSIS — R600 Localized edema: Secondary | ICD-10-CM

## 2012-10-15 DIAGNOSIS — M545 Low back pain, unspecified: Secondary | ICD-10-CM

## 2012-10-15 DIAGNOSIS — M542 Cervicalgia: Secondary | ICD-10-CM

## 2012-10-15 DIAGNOSIS — R609 Edema, unspecified: Secondary | ICD-10-CM

## 2012-10-15 MED ORDER — FUROSEMIDE 40 MG PO TABS: 80.0000 mg | ORAL_TABLET | Freq: Two times a day (BID) | ORAL | Status: DC

## 2012-10-16 ENCOUNTER — Encounter: Payer: Self-pay | Admitting: Family Medicine

## 2012-10-16 MED ORDER — METHYLPREDNISOLONE ACETATE 80 MG/ML IJ SUSP
160.0000 mg | Freq: Once | INTRAMUSCULAR | Status: AC
  Administered 2012-10-16: 160 mg via INTRAMUSCULAR

## 2012-10-16 NOTE — Progress Notes (Signed)
  Subjective:    Patient ID: Melanie Mcdonald, female    DOB: 1933-01-05, 79 y.o.   MRN: [id number]  HPI Here for several issues. First she tripped over a pair of boots on the floor on 10-12-12 and fell, landing on her left buttock and left side. She has had stiffness and pain in the neck and lower back since then. She is able to walk. She needs a refill on Percocet. Also she has had more swelling in the legs for the past month. She is taking 40 mg of lasix bid. No SOB.    Review of Systems  Constitutional: Negative.   HENT: Positive for neck pain and neck stiffness.   Respiratory: Negative.   Cardiovascular: Positive for leg swelling. Negative for chest pain and palpitations.  Musculoskeletal: Positive for back pain and arthralgias.       Objective:   Physical Exam  Constitutional: She is oriented to person, place, and time. She appears well-developed and well-nourished.       Walks with her cane without difficulty   Neck: Normal range of motion. Neck supple.       Mildly tender in the posterior neck   Cardiovascular: Normal rate, regular rhythm, normal heart sounds and intact distal pulses.   Pulmonary/Chest: Effort normal and breath sounds normal.  Musculoskeletal:       3+ edema in both lower legs, they are not red or warm or tender   Neurological: She is alert and oriented to person, place, and time.          Assessment & Plan:  Given a steroid shot. Refilled Percocet. I suggested she try Flexeril for the neck stiffness. Increase Lasix to 80 mg bid for the time being. Recheck in one month

## 2012-11-06 ENCOUNTER — Encounter: Payer: Self-pay | Admitting: Gastroenterology

## 2012-11-06 ENCOUNTER — Telehealth: Payer: Self-pay | Admitting: Family Medicine

## 2012-11-06 NOTE — Telephone Encounter (Signed)
Patient is very dizzy, to the point where it is hard to walk. States it is not a new thing for her, she's had this before. Also states that previouslt, Dr. Scotty Court used to rx Antivert. She wants to know if Dr. Clent Ridges will call her in that, or something similar. States she hasn't had to use it in 6-74yrs. Pt uses [facility] on [address].  Please call pt and let her know.

## 2012-11-06 NOTE — Telephone Encounter (Signed)
Call in Meclizine 25 mg q 4 hours prn dizziness, #60 with 11 rf

## 2012-11-07 MED ORDER — MECLIZINE HCL 25 MG PO TABS: 25.0000 mg | ORAL_TABLET | ORAL | Status: DC | PRN

## 2012-11-07 NOTE — Telephone Encounter (Signed)
I sent script e-scribe and left voice message for pt 

## 2013-02-08 ENCOUNTER — Emergency Department (HOSPITAL_COMMUNITY)
Admission: EM | Admit: 2013-02-08 | Discharge: 2013-02-09 | Disposition: A | Discharge status: Home / Self Care | Payer: Medicare Other | Attending: Emergency Medicine | Admitting: Emergency Medicine

## 2013-02-08 ENCOUNTER — Encounter (HOSPITAL_COMMUNITY): Payer: Self-pay | Admitting: *Deleted

## 2013-02-08 DIAGNOSIS — H538 Other visual disturbances: Secondary | ICD-10-CM | POA: Insufficient documentation

## 2013-02-08 DIAGNOSIS — E669 Obesity, unspecified: Secondary | ICD-10-CM | POA: Insufficient documentation

## 2013-02-08 DIAGNOSIS — Y9389 Activity, other specified: Secondary | ICD-10-CM | POA: Insufficient documentation

## 2013-02-08 DIAGNOSIS — Z880 Allergy status to penicillin: Secondary | ICD-10-CM | POA: Insufficient documentation

## 2013-02-08 DIAGNOSIS — Z8679 Personal history of other diseases of the circulatory system: Secondary | ICD-10-CM | POA: Insufficient documentation

## 2013-02-08 DIAGNOSIS — Z9861 Coronary angioplasty status: Secondary | ICD-10-CM | POA: Insufficient documentation

## 2013-02-08 DIAGNOSIS — IMO0002 Reserved for concepts with insufficient information to code with codable children: Secondary | ICD-10-CM | POA: Insufficient documentation

## 2013-02-08 DIAGNOSIS — Z8742 Personal history of other diseases of the female genital tract: Secondary | ICD-10-CM | POA: Insufficient documentation

## 2013-02-08 DIAGNOSIS — Y929 Unspecified place or not applicable: Secondary | ICD-10-CM | POA: Insufficient documentation

## 2013-02-08 DIAGNOSIS — J4489 Other specified chronic obstructive pulmonary disease: Secondary | ICD-10-CM | POA: Insufficient documentation

## 2013-02-08 DIAGNOSIS — Z8669 Personal history of other diseases of the nervous system and sense organs: Secondary | ICD-10-CM | POA: Insufficient documentation

## 2013-02-08 DIAGNOSIS — E119 Type 2 diabetes mellitus without complications: Secondary | ICD-10-CM | POA: Insufficient documentation

## 2013-02-08 DIAGNOSIS — S0502XA Injury of conjunctiva and corneal abrasion without foreign body, left eye, initial encounter: Secondary | ICD-10-CM

## 2013-02-08 DIAGNOSIS — Z8719 Personal history of other diseases of the digestive system: Secondary | ICD-10-CM | POA: Insufficient documentation

## 2013-02-08 DIAGNOSIS — Z8639 Personal history of other endocrine, nutritional and metabolic disease: Secondary | ICD-10-CM | POA: Insufficient documentation

## 2013-02-08 DIAGNOSIS — F329 Major depressive disorder, single episode, unspecified: Secondary | ICD-10-CM | POA: Insufficient documentation

## 2013-02-08 DIAGNOSIS — Z8673 Personal history of transient ischemic attack (TIA), and cerebral infarction without residual deficits: Secondary | ICD-10-CM | POA: Insufficient documentation

## 2013-02-08 DIAGNOSIS — Z872 Personal history of diseases of the skin and subcutaneous tissue: Secondary | ICD-10-CM | POA: Insufficient documentation

## 2013-02-08 DIAGNOSIS — I10 Essential (primary) hypertension: Secondary | ICD-10-CM | POA: Insufficient documentation

## 2013-02-08 DIAGNOSIS — F3289 Other specified depressive episodes: Secondary | ICD-10-CM | POA: Insufficient documentation

## 2013-02-08 DIAGNOSIS — Z862 Personal history of diseases of the blood and blood-forming organs and certain disorders involving the immune mechanism: Secondary | ICD-10-CM | POA: Insufficient documentation

## 2013-02-08 DIAGNOSIS — Z8739 Personal history of other diseases of the musculoskeletal system and connective tissue: Secondary | ICD-10-CM | POA: Insufficient documentation

## 2013-02-08 DIAGNOSIS — Z794 Long term (current) use of insulin: Secondary | ICD-10-CM | POA: Insufficient documentation

## 2013-02-08 DIAGNOSIS — J449 Chronic obstructive pulmonary disease, unspecified: Secondary | ICD-10-CM | POA: Insufficient documentation

## 2013-02-08 DIAGNOSIS — K219 Gastro-esophageal reflux disease without esophagitis: Secondary | ICD-10-CM | POA: Insufficient documentation

## 2013-02-08 DIAGNOSIS — I2510 Atherosclerotic heart disease of native coronary artery without angina pectoris: Secondary | ICD-10-CM | POA: Insufficient documentation

## 2013-02-08 DIAGNOSIS — S058X9A Other injuries of unspecified eye and orbit, initial encounter: Secondary | ICD-10-CM | POA: Insufficient documentation

## 2013-02-08 DIAGNOSIS — Z79899 Other long term (current) drug therapy: Secondary | ICD-10-CM | POA: Insufficient documentation

## 2013-02-08 DIAGNOSIS — Z87442 Personal history of urinary calculi: Secondary | ICD-10-CM | POA: Insufficient documentation

## 2013-02-08 MED ORDER — KETOROLAC TROMETHAMINE 0.5 % OP SOLN
1.0000 [drp] | Freq: Four times a day (QID) | OPHTHALMIC | Status: DC
  Administered 2013-02-09: 1 [drp] via OPHTHALMIC
  Filled 2013-02-08: qty 3

## 2013-02-08 MED ORDER — CIPROFLOXACIN HCL 0.3 % OP SOLN
1.0000 [drp] | OPHTHALMIC | Status: DC
  Administered 2013-02-09: 1 [drp] via OPHTHALMIC
  Filled 2013-02-08: qty 2.5

## 2013-02-08 NOTE — ED Notes (Signed)
Pt c/o L eye pain after getting a foreign body in L eye while mowing, pt believes it is a rock.

## 2013-02-08 NOTE — ED Notes (Addendum)
Patient states she was mowing grass about 1400 and believes she hit a rock which split up and a piece hit her in the left eye. Patient states that initially it didn't hurt that much, but as the evening went on it became more painful. Patient denies wearing glasses or safety goggles while mowing. Patient reporting blurred vision at this time, states blurriness was present after injury as well, after the administration of tetracaine.

## 2013-02-09 MED ORDER — OXYCODONE-ACETAMINOPHEN 5-325 MG PO TABS: 2.0000 | ORAL_TABLET | ORAL | Status: DC | PRN

## 2013-02-09 NOTE — ED Provider Notes (Signed)
History     CSN: [id number]  Arrival date & time 02/08/13  2240   First MD Initiated Contact with Patient 02/08/13 2305      Chief Complaint  Patient presents with  . Eye Injury    (Consider location/radiation/quality/duration/timing/severity/associated sxs/prior treatment) Patient is a 79 y.o. female presenting with eye injury. The history is provided by the patient.  Eye Injury This is a new problem. The current episode started 6 to 12 hours ago. The problem occurs constantly. The problem has not changed since onset.Pertinent negatives include no chest pain, no abdominal pain, no headaches and no shortness of breath. Exacerbated by: light and blinking. Nothing relieves the symptoms. Treatments tried: percocet with minimal relief.   Mowing her lawn today and felt something hit her L eye and ever since has L eye pain, tearing, some mild blurry vision with FB sensation, feels like a rock hit her eye. She tried flushing it with water. No contacts. H/o lens replacement - has an Ophthmalogist.   No F/C. No eye swelling or bleeding. No visulaized FB.   Past Medical History  Diagnosis Date  . ANEMIA 01/30/2008  . ARTHRITIS 12/11/2007  . BACK PAIN, CHRONIC 06/23/2008  . BREAST CYST, RIGHT 12/17/2007  . CONTACT DERMATITIS 03/10/2009  . CORONARY ARTERY DISEASE 03/01/2007    had a normal Myoview stress test 07-13-11  . CYSTOCELE WITHOUT MENTION UTERINE PROLAPSE LAT 09/12/2010  . DEGENERATIVE JOINT DISEASE 08/07/2007  . DEPRESSION 03/01/2007  . DIABETES MELLITUS, TYPE II 08/26/2008  . Esophageal reflux 11/06/2007  . HYPERKERATOSIS 06/02/2009  . HYPERLIPIDEMIA 08/26/2008  . HYPERTENSION 03/01/2007  . Irritable bowel syndrome 01/26/2009  . LABYRINTHITIS 11/03/2009  . Narcolepsy without cataplexy 08/26/2008  . OBESITY 03/01/2007  . PERIPHERAL VASCULAR DISEASE 01/26/2009  . SYNCOPE 08/26/2008    had brain MRI on 06-28-12 showing only chronic microvascular ischemia and atrophy   . TRANSIENT ISCHEMIC ATTACK  10/20/2009    had normal brain MRA with patent vertebrals and carotids 06-28-12  . NEPHROLITHIASIS 04/29/2009  . COPD (chronic obstructive pulmonary disease)     Past Surgical History  Procedure Laterality Date  . Cataract extraction    . Abdominal hysterectomy    . Back surgery      x2  . Tonsilectomy, adenoidectomy, bilateral myringotomy and tubes    . Spine surgery      x 3  . Cardiac catheterization  07/2009  . Intraocular lens insertion      Family History  Problem Relation Age of Onset  . Lupus Mother   . COPD Father     History  Substance Use Topics  . Smoking status: Never Smoker   . Smokeless tobacco: Never Used  . Alcohol Use: No    OB History   Grav Para Term Preterm Abortions TAB SAB Ect Mult Living                  Review of Systems  Constitutional: Negative for fever and chills.  HENT: Negative for neck pain and neck stiffness.   Eyes: Positive for pain.  Respiratory: Negative for shortness of breath.   Cardiovascular: Negative for chest pain.  Gastrointestinal: Negative for abdominal pain.  Genitourinary: Negative for dysuria.  Musculoskeletal: Negative for back pain.  Skin: Negative for rash.  Neurological: Negative for headaches.  All other systems reviewed and are negative.    Allergies  Aspirin; Doxycycline; Erythromycin; Penicillins; and Sulfonamide derivatives  Home Medications   Current Outpatient Rx  Name  Route  Sig  Dispense  Refill  . ALPRAZolam (XANAX) 0.5 MG tablet   Oral   Take 1 tablet (0.5 mg total) by mouth 3 (three) times daily as needed for sleep or anxiety.   90 tablet   2   . cyclobenzaprine (FLEXERIL) 10 MG tablet   Oral   Take 1 tablet (10 mg total) by mouth 3 (three) times daily as needed for muscle spasms. Muscle spasm   60 tablet   11   . dextroamphetamine (DEXEDRINE SPANSULE) 10 MG 24 hr capsule   Oral   Take 20 mg by mouth daily with breakfast. As needed for narcolepsy.         . diclofenac sodium  (VOLTAREN) 1 % GEL   Topical   Apply 4 g topically 4 (four) times daily as needed. For joint pain.         . furosemide (LASIX) 40 MG tablet   Oral   Take 2 tablets (80 mg total) by mouth 2 (two) times daily.   120 tablet   11   . glipiZIDE (GLUCOTROL XL) 10 MG 24 hr tablet   Oral   Take 1 tablet (10 mg total) by mouth 2 (two) times daily.   60 tablet   11   . EXPIRED: insulin glargine (LANTUS) 100 UNIT/ML injection   Subcutaneous   Inject 100 Units into the skin at bedtime.   30 pen   3   . meclizine (ANTIVERT) 25 MG tablet   Oral   Take 25 mg by mouth every 4 (four) hours as needed (nausea).         Marland Kitchen oxyCODONE-acetaminophen (PERCOCET) 10-325 MG per tablet   Oral   Take 1 tablet by mouth every 4 (four) hours as needed for pain (pain).         . pramipexole (MIRAPEX) 1 MG tablet   Oral   Take 3 mg by mouth at bedtime.         . promethazine (PHENERGAN) 25 MG tablet   Oral   Take 25 mg by mouth every 6 (six) hours as needed (nausea). For nausea.         Marland Kitchen oxyCODONE-acetaminophen (PERCOCET/ROXICET) 5-325 MG per tablet   Oral   Take 2 tablets by mouth every 4 (four) hours as needed for pain.   6 tablet   0     BP 170/80  Pulse 93  Temp(Src) 98.6 F (37 C) (Oral)  Resp 18  SpO2 100%  Physical Exam  Constitutional: She is oriented to person, place, and time. She appears well-developed and well-nourished.  HENT:  Head: Normocephalic and atraumatic.  Eyes: EOM are normal. Pupils are equal, round, and reactive to light. Right eye exhibits no discharge. No scleral icterus.  L eye: lids flipped and clear. No periorbital swelling or edema. No FB. No hyphema.   Fluoro and woods lamp exam - has dye uptake over cornea at 4 o'clock region. No FB.    Neck: Neck supple.  Cardiovascular: Regular rhythm and intact distal pulses.   Pulmonary/Chest: Effort normal. No respiratory distress.  Musculoskeletal: Normal range of motion. She exhibits no edema.   Neurological: She is alert and oriented to person, place, and time.  Skin: Skin is warm and dry.    ED Course  Procedures (including critical care time)    1. Corneal abrasion, left, initial encounter    Pain improved with tetracaine.  ABx eye drops and Ketorolac eye drops provided in ED. PT has an  eye doctor and agrees to f/u Monday for any persistent symptoms or new symptoms. Corneal abrasions precautions provided with RX Percocet as needed.   MDM  Corneal abrasion  Woods lamp exam.   Pain control.  Eye drops provided in ED.   VS and nursing notes reviewed        Sunnie Nielsen, MD 02/09/13 [phone]

## 2013-02-15 ENCOUNTER — Other Ambulatory Visit: Payer: Self-pay | Admitting: Family Medicine

## 2013-02-17 NOTE — Telephone Encounter (Signed)
Okay for 6 months 

## 2013-02-19 ENCOUNTER — Encounter: Payer: Self-pay | Admitting: Family Medicine

## 2013-02-19 ENCOUNTER — Ambulatory Visit (INDEPENDENT_AMBULATORY_CARE_PROVIDER_SITE_OTHER): Payer: Medicare Other | Admitting: Family Medicine

## 2013-02-19 VITALS — BP 150/86 | HR 96 | Temp 98.0°F | Wt 240.0 lb

## 2013-02-19 DIAGNOSIS — E119 Type 2 diabetes mellitus without complications: Secondary | ICD-10-CM

## 2013-02-19 DIAGNOSIS — F3289 Other specified depressive episodes: Secondary | ICD-10-CM

## 2013-02-19 DIAGNOSIS — I10 Essential (primary) hypertension: Secondary | ICD-10-CM

## 2013-02-19 DIAGNOSIS — IMO0001 Reserved for inherently not codable concepts without codable children: Secondary | ICD-10-CM

## 2013-02-19 DIAGNOSIS — F329 Major depressive disorder, single episode, unspecified: Secondary | ICD-10-CM

## 2013-02-19 DIAGNOSIS — I2510 Atherosclerotic heart disease of native coronary artery without angina pectoris: Secondary | ICD-10-CM

## 2013-02-19 DIAGNOSIS — R55 Syncope and collapse: Secondary | ICD-10-CM

## 2013-02-19 DIAGNOSIS — W19XXXS Unspecified fall, sequela: Secondary | ICD-10-CM

## 2013-02-19 MED ORDER — OXYCODONE-ACETAMINOPHEN 10-325 MG PO TABS: 1.0000 | ORAL_TABLET | Freq: Four times a day (QID) | ORAL | Status: DC | PRN

## 2013-02-19 NOTE — Progress Notes (Signed)
  Subjective:    Patient ID: Melanie Mcdonald, female    DOB: January 11, 1933, 79 y.o.   MRN: [id number]  HPI Here to follow up on issues like joint pains and frequent falls. To recap, she saw Dr. Pearlean Brownie last fall and had a full neurologic workup including MRI and MRA of the brain and neck which was unremarkable. Her carotid and vertebral vessels were clear. She saw Dr. Jens Som several years ago but has not seen a Cardiologist for a long while. She stil denies chest pain or palpitations or SOB. She had a normal stress test in 2011. Her neck and back pains still bother her quite a bit.   Review of Systems  HENT: Positive for neck pain and neck stiffness.   Respiratory: Negative.   Cardiovascular: Negative.   Musculoskeletal: Positive for back pain and arthralgias.  Neurological: Positive for weakness and light-headedness. Negative for dizziness, tremors, seizures, syncope, speech difficulty, numbness and headaches.       Objective:   Physical Exam  Constitutional: She is oriented to person, place, and time. She appears well-developed and well-nourished. No distress.  Walks with her cane   Neck: No thyromegaly present.  Neck is tender with decreased ROM   Cardiovascular: Normal rate, regular rhythm, normal heart sounds and intact distal pulses.   Pulmonary/Chest: Effort normal and breath sounds normal.  Lymphadenopathy:    She has no cervical adenopathy.  Neurological: She is alert and oriented to person, place, and time.          Assessment & Plan:  Refilled her Percocets for arthritis pains. As far as her falling spells, we will have Cardiology take another look at her.

## 2013-02-25 ENCOUNTER — Other Ambulatory Visit: Payer: Self-pay | Admitting: Family Medicine

## 2013-02-25 NOTE — Telephone Encounter (Signed)
Call in #[phone] with 5 rf 

## 2013-02-28 ENCOUNTER — Other Ambulatory Visit: Payer: Self-pay | Admitting: Family Medicine

## 2013-04-07 ENCOUNTER — Ambulatory Visit (INDEPENDENT_AMBULATORY_CARE_PROVIDER_SITE_OTHER): Payer: Medicare Other | Admitting: Family Medicine

## 2013-04-07 ENCOUNTER — Encounter: Payer: Self-pay | Admitting: Family Medicine

## 2013-04-07 VITALS — BP 160/90 | HR 102 | Temp 98.2°F

## 2013-04-07 DIAGNOSIS — M1990 Unspecified osteoarthritis, unspecified site: Secondary | ICD-10-CM

## 2013-04-07 DIAGNOSIS — M25569 Pain in unspecified knee: Secondary | ICD-10-CM

## 2013-04-07 DIAGNOSIS — M25562 Pain in left knee: Secondary | ICD-10-CM

## 2013-04-07 MED ORDER — METHYLPREDNISOLONE ACETATE 80 MG/ML IJ SUSP
120.0000 mg | Freq: Once | INTRAMUSCULAR | Status: AC
  Administered 2013-04-07: 120 mg via INTRAMUSCULAR

## 2013-04-07 MED ORDER — OXYCODONE-ACETAMINOPHEN 10-325 MG PO TABS: 1.0000 | ORAL_TABLET | Freq: Four times a day (QID) | ORAL | Status: DC | PRN

## 2013-04-07 MED ORDER — DICLOFENAC SODIUM 1 % TD GEL: 4.0000 g | Freq: Four times a day (QID) | TRANSDERMAL | Status: DC | PRN

## 2013-04-07 MED ORDER — CYCLOBENZAPRINE HCL 10 MG PO TABS: 10.0000 mg | ORAL_TABLET | Freq: Three times a day (TID) | ORAL | Status: DC | PRN

## 2013-04-07 NOTE — Addendum Note (Signed)
Addended by: Aniceto Boss A on: 04/07/2013 03:31 PM   Modules accepted: Orders

## 2013-04-07 NOTE — Progress Notes (Signed)
  Subjective:    Patient ID: DARRA ROSA, female    DOB: 1933-04-22, 79 y.o.   MRN: [id number]  HPI Here to look at her left knee after an injury which occurred last week. She was walking and had a sudden sharp pain in the knee. No trauma or falls recently. The pain is still there. No locking or giving way.    Review of Systems  Constitutional: Negative.   Musculoskeletal: Positive for joint swelling, arthralgias and gait problem.       Objective:   Physical Exam  Constitutional: She appears well-developed and well-nourished.  Walks with a cane   Musculoskeletal:  The left knee is mildly swollen and very tender in both joint spaces. ROm is limited. She has known degenerative arthritis in both knees.           Assessment & Plan:  Possible small cartilage tear. Wear an elastic support brace. Stay of the feet when possible. Recheck prn

## 2013-04-17 ENCOUNTER — Telehealth: Payer: Self-pay | Admitting: Family Medicine

## 2013-04-17 NOTE — Telephone Encounter (Signed)
Patient Information:  Caller Name: Keiasia  Phone: [phone]  Patient: Melanie Mcdonald, Melanie Mcdonald  Gender: Female  DOB: 1933/03/12  Age: 79 Years  PCP: [name] [facility])  Office Follow Up:  Does the office need to follow up with this patient?: No  Instructions For The Office: N/A  RN Note:  Severe left eye pain with tearing, red sclera and sensation of FB in eye.  Denies chemical splash or known FB. Eye pain rated 10/10.  Vison is blurred. FBS not tested.  History of corneal laceration treated in ED one month ago.  Advised to go to [facility] ED per MD protocol; reported she will go to [facility] ED.  Symptoms  Reason For Call & Symptoms: Emergent Call:  Reports left eye is tearing, very painful and sclera is very red.  Reviewed Health History In EMR: Yes  Reviewed Medications In EMR: Yes  Reviewed Allergies In EMR: Yes  Reviewed Surgeries / Procedures: Yes  Date of Onset of Symptoms: 04/16/2013  Treatments Tried: Rx eye gtts ordered by ED after corneal laceration 1 month ago.  Treatments Tried Worked: No  Guideline(s) Used:  Eye - Red Without Pus  Disposition Per Guideline:   Go to ED Now (or to Office with PCP Approval)  Reason For Disposition Reached:   Severe eye pain  Advice Given:  Call Back If:  You become worse.  RN Overrode Recommendation:  Go To ED  No appointments remain in office.

## 2013-04-17 NOTE — Telephone Encounter (Signed)
noted 

## 2013-04-28 ENCOUNTER — Ambulatory Visit: Payer: Medicare Other | Admitting: Cardiology

## 2013-05-09 ENCOUNTER — Encounter: Payer: Self-pay | Admitting: Family Medicine

## 2013-05-09 ENCOUNTER — Ambulatory Visit (INDEPENDENT_AMBULATORY_CARE_PROVIDER_SITE_OTHER): Payer: Medicare Other | Admitting: Family Medicine

## 2013-05-09 VITALS — BP 146/78 | HR 78 | Temp 98.4°F | Wt 234.0 lb

## 2013-05-09 DIAGNOSIS — R109 Unspecified abdominal pain: Secondary | ICD-10-CM

## 2013-05-09 DIAGNOSIS — E119 Type 2 diabetes mellitus without complications: Secondary | ICD-10-CM

## 2013-05-09 LAB — HEPATIC FUNCTION PANEL
Bilirubin, Direct: 0.1 mg/dL (ref 0.0–0.3)
Total Bilirubin: 0.6 mg/dL (ref 0.3–1.2)

## 2013-05-09 LAB — CBC WITH DIFFERENTIAL/PLATELET
Eosinophils Absolute: 0.4 10*3/uL (ref 0.0–0.7)
MCHC: 32.9 g/dL (ref 30.0–36.0)
MCV: 86.9 fl (ref 78.0–100.0)
Monocytes Absolute: 0.6 10*3/uL (ref 0.1–1.0)
Neutrophils Relative %: 53.6 % (ref 43.0–77.0)
Platelets: 245 10*3/uL (ref 150.0–400.0)
RDW: 14.9 % — ABNORMAL HIGH (ref 11.5–14.6)

## 2013-05-09 LAB — POCT URINALYSIS DIPSTICK
Bilirubin, UA: NEGATIVE
Ketones, UA: NEGATIVE
Nitrite, UA: NEGATIVE
Protein, UA: NEGATIVE

## 2013-05-09 LAB — BASIC METABOLIC PANEL
CO2: 29 mEq/L (ref 19–32)
Calcium: 9.3 mg/dL (ref 8.4–10.5)
Chloride: 102 mEq/L (ref 96–112)
Sodium: 141 mEq/L (ref 135–145)

## 2013-05-09 LAB — HEMOGLOBIN A1C: Hgb A1c MFr Bld: 10.7 % — ABNORMAL HIGH (ref 4.6–6.5)

## 2013-05-09 NOTE — Progress Notes (Signed)
Subjective:    Patient ID: Melanie Mcdonald, female    DOB: [birth year], 79 y.o.   MRN: [id number]  HPI Patient seen as a work in for the following: 6 months hx of right side pain.  Sharp pain without radiation. Worse with movement.  Alleviated with holding still. Severity 9/10 No associated appetite or weight changes, stool changes, fever, or chills. Hx of colonoscopy about 10 years ago. Hx of prior TAH.    Patient also requesting A1c. She has history of type 2 diabetes and multiple other medical problems as outlined. She currently is taking only insulin. She apparently took herself off of multiple other medications for concerns over side effects including dizziness.  Past Medical History  Diagnosis Date  . ANEMIA 01/30/2008  . ARTHRITIS 12/11/2007  . BACK PAIN, CHRONIC 06/23/2008  . BREAST CYST, RIGHT 12/17/2007  . CONTACT DERMATITIS 03/10/2009  . CORONARY ARTERY DISEASE 03/01/2007    had a normal Myoview stress test 07-13-11  . CYSTOCELE WITHOUT MENTION UTERINE PROLAPSE LAT 09/12/2010  . DEGENERATIVE JOINT DISEASE 08/07/2007  . DEPRESSION 03/01/2007  . DIABETES MELLITUS, TYPE II 08/26/2008  . Esophageal reflux 11/06/2007  . HYPERKERATOSIS 06/02/2009  . HYPERLIPIDEMIA 08/26/2008  . HYPERTENSION 03/01/2007  . Irritable bowel syndrome 01/26/2009  . LABYRINTHITIS 11/03/2009  . 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 OBESITY 03/01/2007  . PERIPHERAL VASCULAR DISEASE 01/26/2009  . SYNCOPE 08/26/2008    had brain MRI on 06-28-12 showing only chronic microvascular ischemia and atrophy   . TRANSIENT ISCHEMIC ATTACK 10/20/2009    had normal brain MRA with patent vertebrals and carotids 06-28-12  . NEPHROLITHIASIS 04/29/2009  . COPD (chronic obstructive pulmonary disease)    Past Surgical History  Procedure Laterality Date  . Cataract extraction    . Abdominal hysterectomy    . Back surgery      x2  . Tonsilectomy, adenoidectomy, bilateral myringotomy and tubes    . Spine surgery      x  3  . Cardiac catheterization  07/2009  . Intraocular lens insertion      reports that she has never smoked. She has never used smokeless tobacco. She reports that she does not drink alcohol or use illicit drugs. family history includes COPD in her father and Lupus in her mother. Allergies  Allergen Reactions  . Aspirin     Abdominal pain  . Doxycycline Itching  . Erythromycin Nausea And Vomiting  . Penicillins Swelling  . Sulfonamide Derivatives Nausea And Vomiting     Review of Systems  Constitutional: Negative for fever, chills, appetite change and unexpected weight change.  Respiratory: Negative for cough and shortness of breath.   Cardiovascular: Negative for chest pain.  Gastrointestinal: Positive for abdominal pain and constipation. Negative for nausea, vomiting, diarrhea and blood in stool.  Genitourinary: Negative for dysuria and hematuria.  Hematological: Negative for adenopathy.       Objective:   Physical Exam  Constitutional: She is oriented to person, place, and time. She appears well-developed and well-nourished.  Neck: Neck supple. No thyromegaly present.  Cardiovascular: Normal rate and regular rhythm.   Pulmonary/Chest: Effort normal and breath sounds normal. No respiratory distress. She has no wheezes. She has no rales.  Abdominal: Soft. Bowel sounds are normal. She exhibits no distension and no mass. There is tenderness. There is no rebound and no guarding.  She has some tenderness somewhat poorly localized right mid quadrant and right flank area. No masses noted. No skin changes. She has ventral abdominal hernia  which is soft and nontender  Musculoskeletal: She exhibits no edema.  Neurological: She is alert and oriented to person, place, and time.          Assessment & Plan:  #1 chronic 6 month history of right-sided abdominal pain. This is more mid to lower quadrant. Nonfocal exam with exception of some diffuse poorly localized mild tenderness.  Differential is musculoskeletal versus visceral versus nerve impingement. She has some chronic low back issues but no current lumbar back pain. She denies any red flags such as weight loss, fever, chills, or stool changes. Start with basic lab work and urinalysis. Given duration of symptoms, CT abdomen and pelvis to further evaluate. Consider referral to GI for repeat colonoscopy if all the above unrevealing. She is about 10 years out from last colonoscopy. Hemoccults given   #2 type 2 diabetes. Patient requesting A1c. Obtained. Close followup with primary

## 2013-05-12 ENCOUNTER — Ambulatory Visit (INDEPENDENT_AMBULATORY_CARE_PROVIDER_SITE_OTHER)
Admission: RE | Admit: 2013-05-12 | Discharge: 2013-05-12 | Disposition: A | Discharge status: Home / Self Care | Payer: Medicare Other | Source: Ambulatory Visit | Attending: Family Medicine | Admitting: Family Medicine

## 2013-05-12 DIAGNOSIS — R109 Unspecified abdominal pain: Secondary | ICD-10-CM

## 2013-05-12 MED ORDER — IOHEXOL 300 MG/ML  SOLN
100.0000 mL | Freq: Once | INTRAMUSCULAR | Status: AC | PRN
  Administered 2013-05-12: 100 mL via INTRAVENOUS

## 2013-05-22 ENCOUNTER — Ambulatory Visit (INDEPENDENT_AMBULATORY_CARE_PROVIDER_SITE_OTHER): Payer: Medicare Other | Admitting: Family Medicine

## 2013-05-22 ENCOUNTER — Encounter: Payer: Self-pay | Admitting: Family Medicine

## 2013-05-22 VITALS — BP 162/94 | HR 88 | Temp 98.1°F | Wt 234.0 lb

## 2013-05-22 DIAGNOSIS — R1031 Right lower quadrant pain: Secondary | ICD-10-CM

## 2013-05-22 DIAGNOSIS — G8929 Other chronic pain: Secondary | ICD-10-CM

## 2013-05-22 DIAGNOSIS — F3289 Other specified depressive episodes: Secondary | ICD-10-CM

## 2013-05-22 DIAGNOSIS — F329 Major depressive disorder, single episode, unspecified: Secondary | ICD-10-CM

## 2013-05-22 DIAGNOSIS — M542 Cervicalgia: Secondary | ICD-10-CM

## 2013-05-22 MED ORDER — PROMETHAZINE HCL 25 MG PO TABS: 25.0000 mg | ORAL_TABLET | Freq: Four times a day (QID) | ORAL | Status: DC | PRN

## 2013-05-22 MED ORDER — DEXTROAMPHETAMINE SULFATE 10 MG PO TABS: 10.0000 mg | ORAL_TABLET | Freq: Every day | ORAL | Status: DC

## 2013-05-22 MED ORDER — DULOXETINE HCL 30 MG PO CPEP: 30.0000 mg | ORAL_CAPSULE | Freq: Every day | ORAL | Status: DC

## 2013-05-22 MED ORDER — OXYCODONE-ACETAMINOPHEN 10-325 MG PO TABS: 1.0000 | ORAL_TABLET | Freq: Four times a day (QID) | ORAL | Status: DC | PRN

## 2013-05-22 NOTE — Progress Notes (Signed)
  Subjective:    Patient ID: Melanie Mcdonald, female    DOB: [birth year], 79 y.o.   MRN: [id number]  HPI Here to follow up on chronic right sided abdominal pain. This has bothered her for about 6 months. She saw Dr. Caryl Never for this last week and had a CT scan performed which was unremarkable. The pains are sharp and they come and go. BMs are unaffected. She had surgery at the age of 33 to remove her right ovary which had a cyst and her appendix was removed as well. Then she had a hysterectomy years later they had to take down numerous adhesions that had formed in the right abdominal cavity. She still complains of stiffness and pain in the neck. She wants to go back on Cymbalta for depression.    Review of Systems  Constitutional: Negative.   Respiratory: Negative.   Cardiovascular: Negative.   Gastrointestinal: Positive for abdominal pain. Negative for nausea, vomiting, diarrhea, constipation, blood in stool, abdominal distention, anal bleeding and rectal pain.  Psychiatric/Behavioral: Positive for dysphoric mood. Negative for suicidal ideas. The patient is not nervous/anxious.        Objective:   Physical Exam  Constitutional: She appears well-developed and well-nourished.  Abdominal: Soft. Bowel sounds are normal. She exhibits no distension and no mass. There is no rebound and no guarding.  Mild tenderness in the RLQ   Psychiatric: She has a normal mood and affect. Her behavior is normal. Thought content normal.          Assessment & Plan:  Get back on Cymbalta. Her abdominal pains are likely coming from adhesions related to prior surgeries. We will refer to Surgery to evaluate. I suggested she see a chiropractor for her neck pain.

## 2013-05-27 ENCOUNTER — Other Ambulatory Visit: Payer: Self-pay | Admitting: Chiropractic Medicine

## 2013-05-27 ENCOUNTER — Ambulatory Visit
Admission: RE | Admit: 2013-05-27 | Discharge: 2013-05-27 | Disposition: A | Discharge status: Home / Self Care | Payer: Medicare Other | Source: Ambulatory Visit | Attending: Chiropractic Medicine | Admitting: Chiropractic Medicine

## 2013-05-27 DIAGNOSIS — W19XXXA Unspecified fall, initial encounter: Secondary | ICD-10-CM

## 2013-05-28 ENCOUNTER — Ambulatory Visit
Admission: RE | Admit: 2013-05-28 | Discharge: 2013-05-28 | Disposition: A | Discharge status: Home / Self Care | Payer: Medicare Other | Source: Ambulatory Visit | Attending: Chiropractic Medicine | Admitting: Chiropractic Medicine

## 2013-06-03 ENCOUNTER — Encounter (INDEPENDENT_AMBULATORY_CARE_PROVIDER_SITE_OTHER): Payer: Self-pay | Admitting: Surgery

## 2013-06-03 ENCOUNTER — Ambulatory Visit (INDEPENDENT_AMBULATORY_CARE_PROVIDER_SITE_OTHER): Payer: Medicare Other | Admitting: Surgery

## 2013-06-03 VITALS — BP 126/78 | HR 74 | Temp 98.0°F | Resp 18 | Ht 66.0 in | Wt 227.0 lb

## 2013-06-03 DIAGNOSIS — R1031 Right lower quadrant pain: Secondary | ICD-10-CM

## 2013-06-03 DIAGNOSIS — G8929 Other chronic pain: Secondary | ICD-10-CM

## 2013-06-03 NOTE — Progress Notes (Signed)
NAME: Melanie Mcdonald DOB: 06/12/1933 MRN: [id number]                                                                                      DATE: 06/03/2013  PCP: Nelwyn Salisbury, MD Referring Provider: Nelwyn Salisbury, MD  IMPRESSION:  Right lower quadrant pain of uncertain etiology but does not appear to be a surgical cause. It seems more musculoskeletal to me.  PLAN:   I recommended that she go ahead and have a colonoscopy since I think it's been 10 years since her last one. In addition she occasionally has some diarrhea associated with her pain so I think a gastroenterology evaluation would be appropriate                 CC:  Chief Complaint  Patient presents with  . Abdominal Pain    rt abd pain    HPI:  Melanie Mcdonald is a 79 y.o.  female who presents for evaluation of right lower quadrant pain. It is somewhat intermittent. The area that she points out is in the low right lower quadrant, almost in the inguinal area and somewhat laterally. It is intermittent, does not appear to be associated with anything that she use. Occasionally has diarrhea associated with it. It seems to come and go without a definite etiology to bring it on or make it better. She has had a CT scan done for evaluation which is negative. She's concerned there may be some adhesions. She had a right ovary removed and a subsequent hysterectomy. She apparently had her appendix removed at the time of the hysterectomy. She does they have a lot of scar tissue at the second procedure to deal with.  PMH:  has a past medical history of ANEMIA (01/30/2008); ARTHRITIS (12/11/2007); BACK PAIN, CHRONIC (06/23/2008); BREAST CYST, RIGHT (12/17/2007); CONTACT DERMATITIS (03/10/2009); CORONARY ARTERY DISEASE (03/01/2007); CYSTOCELE WITHOUT MENTION UTERINE PROLAPSE LAT (09/12/2010); DEGENERATIVE JOINT DISEASE (08/07/2007); DEPRESSION (03/01/2007); DIABETES MELLITUS, TYPE II (08/26/2008); Esophageal reflux (11/06/2007); HYPERKERATOSIS (06/02/2009);  HYPERLIPIDEMIA (08/26/2008); HYPERTENSION (03/01/2007); Irritable bowel syndrome (01/26/2009); LABYRINTHITIS (11/03/2009); Narcolepsy without cataplexy(347.00) (08/26/2008); OBESITY (03/01/2007); PERIPHERAL VASCULAR DISEASE (01/26/2009); SYNCOPE (08/26/2008); TRANSIENT ISCHEMIC ATTACK (10/20/2009); NEPHROLITHIASIS (04/29/2009); and COPD (chronic obstructive pulmonary disease).  PSH:   has past surgical history that includes Cataract extraction; Abdominal hysterectomy; Back surgery; Tonsilectomy, adenoidectomy, bilateral myringotomy and tubes; Spine surgery; Cardiac catheterization (07/2009); Intraocular lens insertion; Esophagogastroduodenoscopy (11-13-07); and Colonoscopy (11-13-07).  ALLERGIES:   Allergies  Allergen Reactions  . Aspirin     Abdominal pain  . Doxycycline Itching  . Erythromycin Nausea And Vomiting  . Penicillins Swelling  . Sulfonamide Derivatives Nausea And Vomiting    MEDICATIONS: Current outpatient prescriptions:cyclobenzaprine (FLEXERIL) 10 MG tablet, Take 1 tablet by mouth 3 (three) times daily., Disp: , Rfl: ;  dextroamphetamine (DEXTROSTAT) 10 MG tablet, Take 1 tablet (10 mg total) by mouth daily., Disp: 30 tablet, Rfl: 0;  diclofenac sodium (VOLTAREN) 1 % GEL, Apply 4 g topically 4 (four) times daily as needed. For joint pain., Disp: 300 g, Rfl: 11 glipiZIDE (GLUCOTROL XL) 10 MG 24 hr tablet, Take 1 tablet (10 mg total) by mouth 2 (two) times  daily., Disp: 60 tablet, Rfl: 11;  LANTUS SOLOSTAR 100 UNIT/ML SOPN, INJECT 100 UNITS UNDER THE SKIN AT BEDTIME, Disp: 90 mL, Rfl: 1;  oxyCODONE-acetaminophen (PERCOCET) 10-325 MG per tablet, Take 1 tablet by mouth every 6 (six) hours as needed for pain (pain)., Disp: 120 tablet, Rfl: 0 pramipexole (MIRAPEX) 1 MG tablet, Take 3 mg by mouth at bedtime., Disp: , Rfl: ;  promethazine (PHENERGAN) 25 MG tablet, Take 1 tablet (25 mg total) by mouth every 6 (six) hours as needed for nausea (nausea). For nausea., Disp: 60 tablet, Rfl: 5;  ALPRAZolam  (XANAX) 0.5 MG tablet, TAKE 1 TABLET BY MOUTH THREE TIMES DAILY AS NEEDED FOR SLEEP OR ANXIETY, Disp: 90 tablet, Rfl: 5 DULoxetine (CYMBALTA) 30 MG capsule, Take 1 capsule (30 mg total) by mouth daily., Disp: 30 capsule, Rfl: 5;  zolpidem (AMBIEN) 10 MG tablet, TAKE 1 TABLET BY MOUTH EVERY NIGHT AT BEDTIME, Disp: 30 tablet, Rfl: 5  ROS: She has filled out our 12 point review of systems and it is reviewed, noncontributory to current illness EXAM:   BP 126/78  Pulse 74  Temp(Src) 98 F (36.7 C)  Resp 18  Ht 5\' 6"  (1.676 m)  Wt 227 lb (102.967 kg)  BMI 36.66 kg/m2 General: Patient alert oriented, no distress Abdomen: Soft and basically benign. There is an epigastric bulge which I think represents a diastases recti, not a hernia. She does have some tenderness laterally in the inguinal area on the right. There is no hernia noticed. She is not tender on the left inguinal area and no left inguina lhernias noticed either. There are no abdominal masses.there is no organomegaly.  DATA REVIEWED:  I have reviewed the office notes by her primary physicians regarding this problem, I have reviewed the CT scan report and films and discussed them with the radiologist. Specifically, there is no evidence of abnormality in the abdomen or right lower quadrant. There is no evidence of hernia in the epigastric area or the right inguinal area.    Melanie Mcdonald 06/03/2013  CC: Nelwyn Salisbury, MD, Nelwyn Salisbury, MD

## 2013-06-03 NOTE — Patient Instructions (Signed)
I don't think you have a hernia in the upper abdomen and I don't find a cause for your abdominal pain. I think it will be appropriate for you to see your gastroenterologist to see if he has any other suggestions. It appears you are due a colonoscopy anyway.

## 2013-06-27 ENCOUNTER — Other Ambulatory Visit: Payer: Self-pay | Admitting: Family Medicine

## 2013-06-27 NOTE — Telephone Encounter (Signed)
Pt also needs test strips for accucheck aviva plus. (200- 300) This is a new meter for pt.  Pt test 2-3 X day [facility]/ [address]

## 2013-07-01 ENCOUNTER — Ambulatory Visit: Payer: Medicare Other | Admitting: Cardiology

## 2013-07-16 ENCOUNTER — Encounter: Payer: Self-pay | Admitting: Family Medicine

## 2013-07-16 ENCOUNTER — Ambulatory Visit (INDEPENDENT_AMBULATORY_CARE_PROVIDER_SITE_OTHER): Payer: Medicare Other | Admitting: Family Medicine

## 2013-07-16 VITALS — BP 134/76 | HR 98 | Temp 98.0°F | Wt 242.0 lb

## 2013-07-16 DIAGNOSIS — M1990 Unspecified osteoarthritis, unspecified site: Secondary | ICD-10-CM

## 2013-07-16 DIAGNOSIS — G2581 Restless legs syndrome: Secondary | ICD-10-CM

## 2013-07-16 DIAGNOSIS — I10 Essential (primary) hypertension: Secondary | ICD-10-CM

## 2013-07-16 DIAGNOSIS — M25559 Pain in unspecified hip: Secondary | ICD-10-CM

## 2013-07-16 MED ORDER — METHYLPREDNISOLONE ACETATE 80 MG/ML IJ SUSP
120.0000 mg | Freq: Once | INTRAMUSCULAR | Status: AC
  Administered 2013-07-16: 120 mg via INTRAMUSCULAR

## 2013-07-16 MED ORDER — ALPRAZOLAM 0.5 MG PO TABS: ORAL_TABLET | ORAL | Status: DC

## 2013-07-16 MED ORDER — GLIPIZIDE ER 10 MG PO TB24: 10.0000 mg | ORAL_TABLET | Freq: Two times a day (BID) | ORAL | Status: DC

## 2013-07-16 MED ORDER — INSULIN PEN NEEDLE 31G X 8 MM MISC: Status: DC

## 2013-07-16 MED ORDER — PRAMIPEXOLE DIHYDROCHLORIDE 1 MG PO TABS: 3.0000 mg | ORAL_TABLET | Freq: Every day | ORAL | Status: DC

## 2013-07-16 MED ORDER — OXYCODONE-ACETAMINOPHEN 10-325 MG PO TABS: 1.0000 | ORAL_TABLET | Freq: Four times a day (QID) | ORAL | Status: DC | PRN

## 2013-07-16 NOTE — Progress Notes (Signed)
  Subjective:    Patient ID: Melanie Mcdonald, female    DOB: 1933-01-25, 79 y.o.   MRN: [id number]  HPI Here for refills and a steroid shot for her arthritis. She is about the same in general. Seeing Dr. Kathlene Cote for chiropractic care.    Review of Systems  Constitutional: Negative.   Respiratory: Negative.   Cardiovascular: Negative.   Musculoskeletal: Positive for back pain and neck pain.       Objective:   Physical Exam  Constitutional: She appears well-developed and well-nourished.  Cardiovascular: Normal rate, regular rhythm, normal heart sounds and intact distal pulses.   Pulmonary/Chest: Effort normal and breath sounds normal.          Assessment & Plan:  meds refilled.

## 2013-07-16 NOTE — Addendum Note (Signed)
Addended by: Azucena Freed on: 07/16/2013 11:43 AM   Modules accepted: Orders

## 2013-07-16 NOTE — Addendum Note (Signed)
Addended by: Azucena Freed on: 07/16/2013 12:20 PM   Modules accepted: Orders

## 2013-07-23 ENCOUNTER — Telehealth: Payer: Self-pay | Admitting: Family Medicine

## 2013-07-23 NOTE — Telephone Encounter (Signed)
[facility] Triage Call Report Triage Record Num: [id number] Operator: Tomasita Crumble Patient Name: Melanie Mcdonald Call Date & Time: 07/22/2013 4:38:33PM Patient Phone: [phone] PCP: Evelena Peat Patient Gender: Female PCP Fax : [phone] Patient DOB: 09/29/1933 [facility] Name: [facility] Reason for Call: Caller: [name]; PCP: [name] (Family Practice); CB#: [phone]; Call regarding Diarrhea; Onset 07/16/13. She had vomiting that has resolved. Afebrile/subjective. Reports 5 watery stools since lunch on 07/22/13. States she cannot stop stooling long enough to come to office. Reports she feels weak, lightheaded and dizzy, exertional weakness. Other emergent symptoms ruled out. Activate EMS 911 per Diarrhea or Other Change in Bowel Habits due to passing red, black or tarry material from rectum and onset of new signs and symptoms of hypovolemia. Caller insists she will not call 911 or go to ED due to recent bad experience there. She requests to have Rx called in to pharmacy. Advised may sip on clear liquids such as broth or Gatorade, eat bland starchy foods for the interim. Caller voiced understanding. Protocol(s) Used: Diarrhea or Other Change in Bowel Habits Recommended Outcome per Protocol: Activate EMS 911 Reason for Outcome: Passing red, black or tarry material from rectum AND onset of new signs and symptoms of hypovolemia Care Advice: ~ Protect the patient from falling or other harm. ~ Do not give the patient anything to eat or drink. Write down provider's name. List or place the following in a bag for transport with the patient: current prescription and/or nonprescription medications; alternative treatments, therapies and medications; and street drugs. ~ An adult should stay with the patient, preferably one trained in CPR. If the person is not trained in CPR, then he or she should provide hands-only (compression-only) CPR as recommended by the  [employer] Association.

## 2013-07-29 ENCOUNTER — Encounter: Payer: Self-pay | Admitting: Family Medicine

## 2013-07-29 ENCOUNTER — Ambulatory Visit (INDEPENDENT_AMBULATORY_CARE_PROVIDER_SITE_OTHER): Payer: Medicare Other | Admitting: Family Medicine

## 2013-07-29 VITALS — BP 154/76 | HR 99 | Temp 97.8°F

## 2013-07-29 DIAGNOSIS — R300 Dysuria: Secondary | ICD-10-CM

## 2013-07-29 DIAGNOSIS — N390 Urinary tract infection, site not specified: Secondary | ICD-10-CM

## 2013-07-29 DIAGNOSIS — R309 Painful micturition, unspecified: Secondary | ICD-10-CM

## 2013-07-29 LAB — POCT URINALYSIS DIPSTICK
Glucose, UA: NEGATIVE
Spec Grav, UA: 1.025
Urobilinogen, UA: 0.2

## 2013-07-29 MED ORDER — PHENAZOPYRIDINE HCL 200 MG PO TABS: 200.0000 mg | ORAL_TABLET | Freq: Three times a day (TID) | ORAL | Status: DC | PRN

## 2013-07-29 MED ORDER — CIPROFLOXACIN HCL 500 MG PO TABS: 500.0000 mg | ORAL_TABLET | Freq: Two times a day (BID) | ORAL | Status: DC

## 2013-07-29 MED ORDER — NITROGLYCERIN 0.4 MG SL SUBL: 0.4000 mg | SUBLINGUAL_TABLET | SUBLINGUAL | Status: DC | PRN

## 2013-07-29 NOTE — Progress Notes (Signed)
  Subjective:    Patient ID: Melanie Mcdonald, female    DOB: September 06, 1933, 79 y.o.   MRN: [id number]  HPI Here for 2 days of burning on urination, urgency to urinate and nausea. No fever. Drinking plenty of fluids.    Review of Systems  Constitutional: Positive for fatigue.  HENT: Negative.   Eyes: Negative.   Respiratory: Negative.   Cardiovascular: Negative.   Gastrointestinal: Negative.   Genitourinary: Positive for dysuria, urgency and frequency. Negative for hematuria, flank pain and pelvic pain.       Objective:   Physical Exam  Constitutional: She appears well-developed and well-nourished.  Abdominal: Soft. Bowel sounds are normal. She exhibits no distension and no mass. There is no tenderness. There is no rebound and no guarding.          Assessment & Plan:  Culture the sample.

## 2013-07-31 LAB — URINE CULTURE
Colony Count: NO GROWTH
Organism ID, Bacteria: NO GROWTH

## 2013-08-01 ENCOUNTER — Encounter: Payer: Self-pay | Admitting: Family Medicine

## 2013-08-01 ENCOUNTER — Ambulatory Visit (INDEPENDENT_AMBULATORY_CARE_PROVIDER_SITE_OTHER): Payer: Medicare Other | Admitting: Family Medicine

## 2013-08-01 VITALS — BP 180/80 | HR 105 | Temp 98.2°F

## 2013-08-01 DIAGNOSIS — M25559 Pain in unspecified hip: Secondary | ICD-10-CM

## 2013-08-01 DIAGNOSIS — N390 Urinary tract infection, site not specified: Secondary | ICD-10-CM

## 2013-08-01 DIAGNOSIS — M25551 Pain in right hip: Secondary | ICD-10-CM

## 2013-08-01 DIAGNOSIS — J209 Acute bronchitis, unspecified: Secondary | ICD-10-CM

## 2013-08-01 MED ORDER — HYDROCODONE-HOMATROPINE 5-1.5 MG/5ML PO SYRP: 5.0000 mL | ORAL_SOLUTION | ORAL | Status: DC | PRN

## 2013-08-01 MED ORDER — AZITHROMYCIN 250 MG PO TABS: ORAL_TABLET | ORAL | Status: DC

## 2013-08-01 NOTE — Progress Notes (Signed)
Quick Note:  Pt has appointment on 08/01/13 will give normal results then. ______

## 2013-08-01 NOTE — Progress Notes (Signed)
  Subjective:    Patient ID: Melanie Mcdonald, female    DOB: 03-01-1933, 79 y.o.   MRN: [id number]  HPI Here for several issues. She was here a few days ago for a UTI and we started her on Cipro. This is feeling much better now with no burning or urgency. However for 2 days she has had chest tightness and is coughing up green sputum. No fever. Also she mentions her right hip pain that started several years ago. This has been getting worse lately and it hurts to move her right leg at all.    Review of Systems  Constitutional: Negative.   HENT: Positive for postnasal drip.   Eyes: Negative.   Respiratory: Positive for cough and chest tightness.        Objective:   Physical Exam  Constitutional: She appears well-developed and well-nourished.  HENT:  Right Ear: External ear normal.  Left Ear: External ear normal.  Nose: Nose normal.  Mouth/Throat: Oropharynx is clear and moist.  Eyes: Conjunctivae are normal.  Cardiovascular: Normal rate, regular rhythm, normal heart sounds and intact distal pulses.   Pulmonary/Chest: Effort normal. No respiratory distress. She has no rales.  Scattered wheezes and rhonchi   Lymphadenopathy:    She has no cervical adenopathy.          Assessment & Plan:  Doreatha Martin out the Cipro for the UTI. Given a Zpack for the bronchitis. Refer to Orthopedics for the hip pain.

## 2013-08-04 ENCOUNTER — Other Ambulatory Visit: Payer: Self-pay | Admitting: Family Medicine

## 2013-08-15 ENCOUNTER — Encounter: Payer: Self-pay | Admitting: Gastroenterology

## 2013-08-20 ENCOUNTER — Other Ambulatory Visit: Payer: Self-pay | Admitting: Family Medicine

## 2013-08-20 NOTE — Telephone Encounter (Signed)
Call in #30 with 5 rf 

## 2013-08-26 ENCOUNTER — Other Ambulatory Visit: Payer: Self-pay | Admitting: Family Medicine

## 2013-09-03 ENCOUNTER — Other Ambulatory Visit: Payer: Self-pay | Admitting: Family Medicine

## 2013-09-22 ENCOUNTER — Ambulatory Visit (INDEPENDENT_AMBULATORY_CARE_PROVIDER_SITE_OTHER): Payer: Medicare Other | Admitting: Family Medicine

## 2013-09-22 ENCOUNTER — Ambulatory Visit: Payer: Medicare Other | Admitting: Internal Medicine

## 2013-09-22 ENCOUNTER — Encounter: Payer: Self-pay | Admitting: Internal Medicine

## 2013-09-22 ENCOUNTER — Encounter: Payer: Self-pay | Admitting: Family Medicine

## 2013-09-22 VITALS — BP 144/70 | HR 82 | Temp 99.1°F | Wt 238.0 lb

## 2013-09-22 DIAGNOSIS — N390 Urinary tract infection, site not specified: Secondary | ICD-10-CM

## 2013-09-22 LAB — POCT URINALYSIS DIPSTICK
Bilirubin, UA: NEGATIVE
Ketones, UA: NEGATIVE
Nitrite, UA: NEGATIVE
pH, UA: 6

## 2013-09-22 MED ORDER — NITROFURANTOIN MONOHYD MACRO 100 MG PO CAPS: 100.0000 mg | ORAL_CAPSULE | Freq: Two times a day (BID) | ORAL | Status: DC

## 2013-09-22 NOTE — Progress Notes (Signed)
Pre visit review using our clinic review tool, if applicable. No additional management support is needed unless otherwise documented below in the visit note. 

## 2013-09-22 NOTE — Addendum Note (Signed)
Addended by: Aniceto Boss A on: 09/22/2013 04:59 PM   Modules accepted: Orders

## 2013-09-22 NOTE — Progress Notes (Signed)
   Subjective:    Patient ID: Melanie Mcdonald, female    DOB: 18-Feb-1933, 79 y.o.   MRN: [id number]  HPI Here to recheck for a UTI. She had been taking Cipro and this seemed to help but she had to stop this due to stomach cramps and diarrhea. No fever. Now she again has urinary burning and urgency.    Review of Systems  Constitutional: Negative.   Genitourinary: Positive for dysuria, urgency, frequency and flank pain. Negative for hematuria and pelvic pain.       Objective:   Physical Exam  Constitutional: She appears well-developed and well-nourished.  Abdominal: Soft. Bowel sounds are normal. She exhibits no distension and no mass. There is no tenderness. There is no rebound and no guarding.          Assessment & Plan:  Try Macrobid. Culture the sample.

## 2013-09-24 LAB — URINE CULTURE

## 2013-09-27 ENCOUNTER — Ambulatory Visit (INDEPENDENT_AMBULATORY_CARE_PROVIDER_SITE_OTHER): Payer: Medicare Other | Admitting: Family Medicine

## 2013-09-27 ENCOUNTER — Encounter: Payer: Self-pay | Admitting: Family Medicine

## 2013-09-27 VITALS — BP 144/80 | HR 78 | Temp 97.7°F | Wt 236.0 lb

## 2013-09-27 DIAGNOSIS — J209 Acute bronchitis, unspecified: Secondary | ICD-10-CM

## 2013-09-27 DIAGNOSIS — N390 Urinary tract infection, site not specified: Secondary | ICD-10-CM

## 2013-09-27 MED ORDER — METHYLPREDNISOLONE ACETATE 80 MG/ML IJ SUSP
120.0000 mg | Freq: Once | INTRAMUSCULAR | Status: AC
  Administered 2013-09-27: 120 mg via INTRAMUSCULAR

## 2013-09-27 MED ORDER — CLARITHROMYCIN 500 MG PO TABS: 500.0000 mg | ORAL_TABLET | Freq: Two times a day (BID) | ORAL | Status: DC

## 2013-09-27 MED ORDER — HYDROCODONE-HOMATROPINE 5-1.5 MG/5ML PO SYRP: 5.0000 mL | ORAL_SOLUTION | ORAL | Status: DC | PRN

## 2013-09-27 NOTE — Progress Notes (Signed)
Pre visit review using our clinic review tool, if applicable. No additional management support is needed unless otherwise documented below in the visit note. 

## 2013-09-27 NOTE — Progress Notes (Signed)
   Subjective:    Patient ID: Melanie Mcdonald, female    DOB: Mar 25, 1933, 79 y.o.   MRN: [id number]  HPI Here for upper respiratory symptoms including stuffy head, PND, ST, chest tightness and coughing up yellow sputum. No fever. She was seen 5 days ago for a UTI and was started on Macrobid. We attempted to culture the urine but no bacteria could be isolated. The UTI symptoms are totally gone now. She drinks plenty of water.    Review of Systems  Constitutional: Negative.   HENT: Positive for congestion, postnasal drip and sinus pressure.   Eyes: Negative.   Respiratory: Positive for cough and chest tightness. Negative for shortness of breath and wheezing.   Cardiovascular: Negative.   Genitourinary: Negative.        Objective:   Physical Exam  Constitutional: She appears well-developed and well-nourished.  HENT:  Right Ear: External ear normal.  Left Ear: External ear normal.  Nose: Nose normal.  Mouth/Throat: Oropharynx is clear and moist.  Eyes: Conjunctivae are normal.  Pulmonary/Chest: Effort normal. No respiratory distress. She has no wheezes. She has no rales.  Scattered rhonchi   Lymphadenopathy:    She has no cervical adenopathy.          Assessment & Plan:  The UTI is responding well to the Macrobid so she will finish this out. She now also has a bronchitis, so she is given a steroid shot and a course of Biaxin. Recheck prn

## 2013-11-04 ENCOUNTER — Telehealth: Payer: Self-pay | Admitting: Family Medicine

## 2013-11-04 NOTE — Telephone Encounter (Signed)
Pt needs re-fill oxyCODONE-acetaminophen (PERCOCET) 10-325 MG per tablet.

## 2013-11-05 MED ORDER — OXYCODONE-ACETAMINOPHEN 10-325 MG PO TABS: 1.0000 | ORAL_TABLET | Freq: Four times a day (QID) | ORAL | Status: DC | PRN

## 2013-11-05 NOTE — Telephone Encounter (Signed)
Done but she needs testing and a contract  

## 2013-11-06 NOTE — Telephone Encounter (Signed)
Script is ready for pick up, contract printed and I spoke with pt.

## 2013-12-02 ENCOUNTER — Encounter: Payer: Self-pay | Admitting: Family Medicine

## 2013-12-10 ENCOUNTER — Ambulatory Visit (INDEPENDENT_AMBULATORY_CARE_PROVIDER_SITE_OTHER): Payer: Medicare Other | Admitting: Family Medicine

## 2013-12-10 ENCOUNTER — Encounter: Payer: Self-pay | Admitting: Family Medicine

## 2013-12-10 VITALS — BP 144/82 | HR 100 | Temp 98.2°F

## 2013-12-10 DIAGNOSIS — R609 Edema, unspecified: Secondary | ICD-10-CM

## 2013-12-10 DIAGNOSIS — R600 Localized edema: Secondary | ICD-10-CM

## 2013-12-10 DIAGNOSIS — M129 Arthropathy, unspecified: Secondary | ICD-10-CM

## 2013-12-10 DIAGNOSIS — R413 Other amnesia: Secondary | ICD-10-CM

## 2013-12-10 DIAGNOSIS — R82998 Other abnormal findings in urine: Secondary | ICD-10-CM

## 2013-12-10 DIAGNOSIS — R0602 Shortness of breath: Secondary | ICD-10-CM

## 2013-12-10 DIAGNOSIS — E119 Type 2 diabetes mellitus without complications: Secondary | ICD-10-CM

## 2013-12-10 DIAGNOSIS — M1990 Unspecified osteoarthritis, unspecified site: Secondary | ICD-10-CM

## 2013-12-10 DIAGNOSIS — R42 Dizziness and giddiness: Secondary | ICD-10-CM

## 2013-12-10 DIAGNOSIS — R8290 Unspecified abnormal findings in urine: Secondary | ICD-10-CM

## 2013-12-10 LAB — BASIC METABOLIC PANEL
BUN: 16 mg/dL (ref 6–23)
CO2: 31 mEq/L (ref 19–32)
Calcium: 9.2 mg/dL (ref 8.4–10.5)
Chloride: 104 mEq/L (ref 96–112)
Creatinine, Ser: 0.7 mg/dL (ref 0.4–1.2)
GFR: 83.18 mL/min (ref >60.00)
GLUCOSE: 118 mg/dL — AB (ref 70–99)
Potassium: 4.3 mEq/L (ref 3.5–5.1)
SODIUM: 139 meq/L (ref 135–145)

## 2013-12-10 LAB — HEPATIC FUNCTION PANEL
ALBUMIN: 3.6 g/dL (ref 3.5–5.2)
ALK PHOS: 81 U/L (ref 39–117)
ALT: 15 U/L (ref 0–35)
AST: 17 U/L (ref 0–37)
Bilirubin, Direct: 0.1 mg/dL (ref 0.0–0.3)
Total Bilirubin: 0.5 mg/dL (ref 0.3–1.2)
Total Protein: 7.1 g/dL (ref 6.0–8.3)

## 2013-12-10 LAB — HEMOGLOBIN A1C: Hgb A1c MFr Bld: 9.7 % — ABNORMAL HIGH (ref 4.6–6.5)

## 2013-12-10 LAB — CBC WITH DIFFERENTIAL/PLATELET
BASOS PCT: 0.5 % (ref 0.0–3.0)
Basophils Absolute: 0 10*3/uL (ref 0.0–0.1)
Eosinophils Absolute: 0.2 10*3/uL (ref 0.0–0.7)
Eosinophils Relative: 2 % (ref 0.0–5.0)
HCT: 38.9 % (ref 36.0–46.0)
Hemoglobin: 12.8 g/dL (ref 12.0–15.0)
LYMPHS PCT: 31 % (ref 12.0–46.0)
Lymphs Abs: 2.9 10*3/uL (ref 0.7–4.0)
MCHC: 32.9 g/dL (ref 30.0–36.0)
MCV: 89.2 fl (ref 78.0–100.0)
MONOS PCT: 5 % (ref 3.0–12.0)
Monocytes Absolute: 0.5 10*3/uL (ref 0.1–1.0)
Neutro Abs: 5.8 10*3/uL (ref 1.4–7.7)
Neutrophils Relative %: 61.5 % (ref 43.0–77.0)
Platelets: 237 10*3/uL (ref 150.0–400.0)
RBC: 4.37 Mil/uL (ref 3.87–5.11)
RDW: 14.3 % (ref 11.5–14.6)
WBC: 9.5 10*3/uL (ref 4.5–10.5)

## 2013-12-10 LAB — POCT URINALYSIS DIPSTICK
Bilirubin, UA: NEGATIVE
Blood, UA: NEGATIVE
KETONES UA: NEGATIVE
Nitrite, UA: NEGATIVE
PROTEIN UA: NEGATIVE
SPEC GRAV UA: 1.02
Urobilinogen, UA: 0.2
pH, UA: 6.5

## 2013-12-10 LAB — TSH: TSH: 0.71 u[IU]/mL (ref 0.35–5.50)

## 2013-12-10 LAB — BRAIN NATRIURETIC PEPTIDE: Pro B Natriuretic peptide (BNP): 87 pg/mL (ref 0.0–100.0)

## 2013-12-10 MED ORDER — METHYLPREDNISOLONE ACETATE 80 MG/ML IJ SUSP
120.0000 mg | Freq: Once | INTRAMUSCULAR | Status: AC
  Administered 2013-12-10: 120 mg via INTRAMUSCULAR

## 2013-12-10 MED ORDER — FUROSEMIDE 40 MG PO TABS: 40.0000 mg | ORAL_TABLET | Freq: Two times a day (BID) | ORAL | Status: DC

## 2013-12-10 MED ORDER — OXYCODONE-ACETAMINOPHEN 10-325 MG PO TABS: 1.0000 | ORAL_TABLET | Freq: Four times a day (QID) | ORAL | Status: DC | PRN

## 2013-12-10 MED ORDER — ZOLPIDEM TARTRATE 10 MG PO TABS: ORAL_TABLET | ORAL | Status: DC

## 2013-12-10 NOTE — Addendum Note (Signed)
Addended by: Aggie Hacker A on: 12/10/2013 04:56 PM   Modules accepted: Orders

## 2013-12-10 NOTE — Progress Notes (Signed)
Pre visit review using our clinic review tool, if applicable. No additional management support is needed unless otherwise documented below in the visit note. 

## 2013-12-10 NOTE — Addendum Note (Signed)
Addended by: Aggie Hacker A on: 12/10/2013 05:27 PM   Modules accepted: Orders

## 2013-12-10 NOTE — Progress Notes (Signed)
   Subjective:    Patient ID: Melanie Mcdonald, female    DOB: [birth year], 80 y.o.   MRN: [id number]  HPI Here for a number of problems. First she has been swelling in the feet for a month or so and she has been more SOB than usual. No chest pains. Also she has been more dizzy lately and unsteady on her feet. She always uses a cane. Her joint pains are worse lately.    Review of Systems  Constitutional: Negative.   Respiratory: Positive for shortness of breath. Negative for cough and wheezing.   Cardiovascular: Positive for leg swelling. Negative for chest pain and palpitations.       Objective:   Physical Exam  Constitutional: She is oriented to person, place, and time.  Walks slowly with her cane  Cardiovascular: Normal rate, regular rhythm, normal heart sounds and intact distal pulses.   Pulmonary/Chest: Effort normal and breath sounds normal. No respiratory distress. She has no wheezes. She has no rales.  Abdominal: Soft. Bowel sounds are normal. She exhibits no distension and no mass. There is no tenderness. There is no rebound and no guarding.  Musculoskeletal:  2+ edema in both feet and ankles  Neurological: She is alert and oriented to person, place, and time.          Assessment & Plan:  Get back on Lasix 40 mg bid. Check for CHF with labs and an ECHO.

## 2013-12-16 ENCOUNTER — Ambulatory Visit (HOSPITAL_COMMUNITY): Payer: Medicare Other | Attending: Family Medicine | Admitting: Radiology

## 2013-12-16 DIAGNOSIS — R609 Edema, unspecified: Secondary | ICD-10-CM

## 2013-12-16 DIAGNOSIS — R0602 Shortness of breath: Secondary | ICD-10-CM

## 2013-12-16 MED ORDER — SITAGLIPTIN PHOSPHATE 100 MG PO TABS: 100.0000 mg | ORAL_TABLET | Freq: Every day | ORAL | Status: DC

## 2013-12-16 NOTE — Addendum Note (Signed)
Addended by: Aggie Hacker A on: 12/16/2013 03:40 PM   Modules accepted: Orders

## 2013-12-16 NOTE — Progress Notes (Signed)
Echocardiogram performed.  

## 2013-12-17 ENCOUNTER — Telehealth: Payer: Self-pay

## 2013-12-17 NOTE — Addendum Note (Signed)
Addended by: Alysia Penna A on: 12/17/2013 05:38 AM   Modules accepted: Orders

## 2013-12-17 NOTE — Telephone Encounter (Signed)
Relevant patient education mailed to patient.  

## 2013-12-30 ENCOUNTER — Encounter: Payer: Self-pay | Admitting: Neurology

## 2013-12-30 ENCOUNTER — Ambulatory Visit (INDEPENDENT_AMBULATORY_CARE_PROVIDER_SITE_OTHER): Payer: Medicare Other | Admitting: Neurology

## 2013-12-30 VITALS — BP 150/80 | HR 76 | Temp 98.3°F | Ht 67.0 in | Wt 238.0 lb

## 2013-12-30 DIAGNOSIS — R413 Other amnesia: Secondary | ICD-10-CM | POA: Insufficient documentation

## 2013-12-30 DIAGNOSIS — R519 Headache, unspecified: Secondary | ICD-10-CM

## 2013-12-30 DIAGNOSIS — R42 Dizziness and giddiness: Secondary | ICD-10-CM

## 2013-12-30 DIAGNOSIS — R51 Headache: Secondary | ICD-10-CM

## 2013-12-30 DIAGNOSIS — R404 Transient alteration of awareness: Secondary | ICD-10-CM

## 2013-12-30 HISTORY — DX: Headache, unspecified: R51.9

## 2013-12-30 HISTORY — DX: Other amnesia: R41.3

## 2013-12-30 NOTE — Progress Notes (Signed)
NEUROLOGY CONSULTATION NOTE  [name] MRN: [id number] DOB: 22-Jan-1933  Referring provider: Dr. Alysia Penna Primary care provider: Dr. Alysia Penna  Reason for consult:  Memory loss, dizziness, episodes of loss of consciousness  Dear Dr Sarajane Jews:  Thank you for your kind referral of Melanie Mcdonald for consultation of the above symptoms. Although her history is well known to you, please allow me to reiterate it for the purpose of our medical record. Records and images were personally reviewed where available.  HISTORY OF PRESENT ILLNESS: This is a 80 year old right-handed woman who reports a 2-year history of dizziness.  She recalls feeling dizzy at church with a headache and was brought home by friends.  That afternoon, she fell into a ditch near her home and was apparently there for hours because she could not move her legs.  She was able to dig holes in the wall and slowly crawl out to get a passerby's attention.  She was brought to the hospital and developed ?cellulitis in her left leg, requiring a boot on her left leg.  As she was going out the door one time, she fell and injured her other leg.  Since then,she stated having dizziness described as "like I cannot keep my balance." There is occasional nausea and vomiting.  She reports horizontal diplopia when tired, especially at night when driving.  She denies any dysarthria, dysphagia, focal numbness/tingling/weakness, reporting that both feet are numb.  She started having "blackout spells" where she would wake up on the floor with no recollection of events, no prior warning symptoms.  Initially she was having up to 2 episodes a day.  She has hit her kitchen cabinet twice and broke it.  She denies any associated dizziness or focal weakness around the time of passing out.  She started having headaches in the occipital region occurring daily.  She reports a headache right now, with light sensitivity and turning her head.  She has some neck pain at  the base of her skull.  She reports episodes of passing out stopped after she self-discontinued her medications.  Unfortunately, she had a recurrence of loss of consciousness last week when she woke up on the floor.  She is concerned about her memory, she forgets dates, the things she is supposed to do.  She has left the stove on.  She continues to drive without difficulty.  She has no problems remembering to pay the bills.  Her son has told her she repeats herself.  She lives alone.  She bring a list of medications, and reports that she stopped Xanax (made her depressed).  She also stopped Hyzaar and Plavix.  She was prescribed Cymbalta but reports this was ineffective.  She was given a prescription for Topamax which she has not filled yet.  PAST MEDICAL HISTORY: Past Medical History  Diagnosis Date  . ANEMIA 01/30/2008  . ARTHRITIS 12/11/2007  . BACK PAIN, CHRONIC 06/23/2008  . BREAST CYST, RIGHT 12/17/2007  . CONTACT DERMATITIS 03/10/2009  . CORONARY ARTERY DISEASE 03/01/2007    had a normal Myoview stress test 07-13-11  . CYSTOCELE WITHOUT MENTION UTERINE PROLAPSE LAT 09/12/2010  . DEGENERATIVE JOINT DISEASE 08/07/2007  . DEPRESSION 03/01/2007  . DIABETES MELLITUS, TYPE II 08/26/2008  . Esophageal reflux 11/06/2007  . HYPERKERATOSIS 06/02/2009  . HYPERLIPIDEMIA 08/26/2008  . HYPERTENSION 03/01/2007  . Irritable bowel syndrome 01/26/2009  . LABYRINTHITIS 11/03/2009  . 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 OBESITY 03/01/2007  . PERIPHERAL VASCULAR DISEASE  01/26/2009  . SYNCOPE 08/26/2008    had brain MRI on 06-28-12 showing only chronic microvascular ischemia and atrophy   . TRANSIENT ISCHEMIC ATTACK 10/20/2009    had normal brain MRA with patent vertebrals and carotids 06-28-12  . NEPHROLITHIASIS 04/29/2009  . COPD (chronic obstructive pulmonary disease)     PAST SURGICAL HISTORY: Past Surgical History  Procedure Laterality Date  . Cataract extraction    . Abdominal hysterectomy    .  Back surgery      x2  . Tonsilectomy, adenoidectomy, bilateral myringotomy and tubes    . Spine surgery      x 3  . Cardiac catheterization  07/2009  . Intraocular lens insertion    . Esophagogastroduodenoscopy  11-13-07    per Dr. Deatra Ina, normal   . Colonoscopy  11-13-07    per Dr. Deatra Ina, benign polyps, repeat in 5 yrs    MEDICATIONS: Current Outpatient Prescriptions on File Prior to Visit  Medication Sig Dispense Refill  . diclofenac sodium (VOLTAREN) 1 % GEL Apply 4 g topically 4 (four) times daily as needed. For joint pain.  300 g  11  . furosemide (LASIX) 40 MG tablet Take 1 tablet (40 mg total) by mouth 2 (two) times daily.  60 tablet  5  . glipiZIDE (GLUCOTROL XL) 10 MG 24 hr tablet Take 1 tablet (10 mg total) by mouth 2 (two) times daily.  60 tablet  11  . HYDROcodone-homatropine (HYDROMET) 5-1.5 MG/5ML syrup Take 5 mLs by mouth every 4 (four) hours as needed for cough.  240 mL  0  . Insulin Pen Needle (B-D ULTRAFINE III SHORT PEN) 31G X 8 MM MISC Use as directed.  100 each  5  . LANTUS SOLOSTAR 100 UNIT/ML SOPN INJECT 100 UNITS UNDER THE SKIN AT BEDTIME  90 mL  1  . nitroGLYCERIN (NITROSTAT) 0.4 MG SL tablet Place 1 tablet (0.4 mg total) under the tongue every 5 (five) minutes as needed for chest pain.  50 tablet  5  . oxyCODONE-acetaminophen (PERCOCET) 10-325 MG per tablet Take 1 tablet by mouth every 6 (six) hours as needed for pain (pain).  120 tablet  0  . pramipexole (MIRAPEX) 1 MG tablet Take 3 tablets (3 mg total) by mouth at bedtime.  30 tablet  5  . promethazine (PHENERGAN) 25 MG tablet Take 1 tablet (25 mg total) by mouth every 6 (six) hours as needed for nausea (nausea). For nausea.  60 tablet  5  . zolpidem (AMBIEN) 10 MG tablet TAKE 1 TABLET BY MOUTH ONCE DAILY AT BEDTIME AS NEEDED  30 tablet  5  . ALPRAZolam (XANAX) 0.5 MG tablet TAKE 1 TABLET BY MOUTH THREE TIMES DAILY AS NEEDED FOR SLEEP OR ANXIETY  90 tablet  5  . clarithromycin (BIAXIN) 500 MG tablet Take 1  tablet (500 mg total) by mouth 2 (two) times daily.  20 tablet  0  . cyclobenzaprine (FLEXERIL) 10 MG tablet Take 1 tablet by mouth 3 (three) times daily.      Marland Kitchen dextroamphetamine (DEXTROSTAT) 10 MG tablet Take 1 tablet (10 mg total) by mouth daily.  30 tablet  0  . DULoxetine (CYMBALTA) 30 MG capsule Take 1 capsule (30 mg total) by mouth daily.  30 capsule  5  . nitrofurantoin, macrocrystal-monohydrate, (MACROBID) 100 MG capsule Take 1 capsule (100 mg total) by mouth 2 (two) times daily.  20 capsule  0  . phenazopyridine (PYRIDIUM) 200 MG tablet Take 1 tablet (200 mg total) by  mouth 3 (three) times daily as needed for pain.  60 tablet  5  . sitaGLIPtin (JANUVIA) 100 MG tablet Take 1 tablet (100 mg total) by mouth daily.  30 tablet  11   No current facility-administered medications on file prior to visit.    ALLERGIES: Allergies  Allergen Reactions  . Aspirin     Abdominal pain  . Doxycycline Itching  . Erythromycin Nausea And Vomiting  . Penicillins Swelling  . Sulfonamide Derivatives Nausea And Vomiting    FAMILY HISTORY: Family History  Problem Relation Age of Onset  . Lupus Mother   . COPD Father     SOCIAL HISTORY: History   Social History  . Marital Status: Widowed    Spouse Name: N/A    Number of Children: N/A  . Years of Education: N/A   Occupational History  . Not on file.   Social History Main Topics  . Smoking status: Never Smoker   . Smokeless tobacco: Never Used  . Alcohol Use: No  . Drug Use: No  . Sexual Activity: No   Other Topics Concern  . Not on file   Social History Narrative  . No narrative on file    REVIEW OF SYSTEMS: Constitutional: No fevers, chills, or sweats, no generalized fatigue, change in appetite Eyes: as above Ear, nose and throat: No hearing loss, ear pain, nasal congestion, sore throat Cardiovascular: No chest pain, palpitations Respiratory:  No shortness of breath at rest or with exertion, wheezes GastrointestinaI: occl  nausea, vomiting, no diarrhea, abdominal pain, fecal incontinence Genitourinary:  No dysuria, urinary retention or frequency Musculoskeletal:  No neck pain, back pain Integumentary: No rash, pruritus, skin lesions Neurological: as above Psychiatric: No depression, insomnia, anxiety Endocrine: No palpitations, fatigue, diaphoresis, mood swings, change in appetite, change in weight, increased thirst Hematologic/Lymphatic:  No anemia, purpura, petechiae. Allergic/Immunologic: no itchy/runny eyes, nasal congestion, recent allergic reactions, rashes  PHYSICAL EXAM: Filed Vitals:   12/30/13 1247  BP: 150/80  Pulse: 76  Temp: 98.3 F (36.8 C)   General: No acute distress Head:  Normocephalic/atraumatic Neck: supple, no paraspinal tenderness, full range of motion Back: No paraspinal tenderness Heart: regular rate and rhythm Lungs: Clear to auscultation bilaterally. Vascular: No carotid bruits. Skin/Extremities: No rash, no edema Neurological Exam: Mental status: alert and oriented to person, place, and time, no dysarthria or aphasia, Fund of knowledge is appropriate.  Recent and remote memory are intact.  Attention and concentration are normal.    Able to name objects and repeat phrases. MMSE 30/30 Cranial nerves: CN I: not tested CN II: pupils equal, round and reactive to light, visual fields intact, fundi unremarkable. CN III, IV, VI:  full range of motion, no nystagmus, no ptosis CN V: facial sensation intact CN VII: upper and lower face symmetric CN VIII: hearing intact CN IX, X: gag intact, uvula midline CN XI: sternocleidomastoid and trapezius muscles intact CN XII: tongue midline Bulk & Tone: normal, no fasciculations. Motor: 5/5 throughout with no pronator drift. Sensation: decreased cold, pin, and vibration up to ankles bilaterally.  Intact to light touch, cold, pin on both UE. Romberg test slight sway Deep Tendon Reflexes: brisk +3 on left UE, +2 on right UE, both patella,  absent bilateral ankle jerks.  no ankle clonus, negative Hoffman's sign Plantar responses: downgoing bilaterally Cerebellar: no incoordination on finger to nose Gait: narrow-based and steady, able to tandem walk adequately. Tremor: none  IMPRESSION: This is a 80 year old right-handed woman with a history  of hypertension, diabetes, RLS, presenting for multiple neurological symptoms, including dizziness, headaches, memory loss, and episodes of loss of consciousness.  Her MMSE today is 30/30, neurological exam non-focal with note of a length-dependent peripheral neuropathy, likely secondary to diabetes.  An MRI brain and MRA head without contrast will be ordered to assess for underlying structural abnormality that could cause the above symptoms.  Routine EEG will be ordered for the episodes of loss of consciousness, per PCP note in the past, she was referred to Cardiology however it does not appear that the patient followed up on this.  We discussed [place] driving laws, she should not drive after any episode of loss of consciousness/awareness until 6 months event-free.  She expressed understanding.  She will follow-up after the tests.  Thank you for allowing me to participate in the care of this patient. Please do not hesitate to call for any questions or concerns.   Ellouise Newer, M.D.

## 2013-12-30 NOTE — Patient Instructions (Signed)
1. MRI brain without contrast for headache 2. MRA head without contrast for dizziness 3. Routine EEG for blackouts 4. Continue current medications 5. As per [place] driving laws, for any episode of loss of consciousness, you should not drive until 6 months event-free.

## 2014-01-05 ENCOUNTER — Ambulatory Visit (HOSPITAL_COMMUNITY)
Admission: RE | Admit: 2014-01-05 | Discharge: 2014-01-05 | Disposition: A | Discharge status: Home / Self Care | Payer: Medicare Other | Source: Ambulatory Visit | Attending: Neurology | Admitting: Neurology

## 2014-01-05 DIAGNOSIS — R42 Dizziness and giddiness: Secondary | ICD-10-CM | POA: Insufficient documentation

## 2014-01-05 DIAGNOSIS — R404 Transient alteration of awareness: Secondary | ICD-10-CM | POA: Insufficient documentation

## 2014-01-05 DIAGNOSIS — R569 Unspecified convulsions: Secondary | ICD-10-CM

## 2014-01-05 DIAGNOSIS — R51 Headache: Secondary | ICD-10-CM

## 2014-01-05 DIAGNOSIS — R413 Other amnesia: Secondary | ICD-10-CM | POA: Insufficient documentation

## 2014-01-05 NOTE — Progress Notes (Signed)
OP adult EEG completed.

## 2014-01-08 NOTE — Procedures (Signed)
ELECTROENCEPHALOGRAM REPORT  Date of Study: 01/05/2014  Patient's Name: ZOILA DITULLIO MRN: [id number] Date of Birth: [birth year]  Referring Provider: Dr. Ellouise Newer  Clinical History: This is a 80 year old woman with a history of hypertension, diabetes, RLS, with memory loss and episodes of loss of consciousness  Medications: Cymbalta, Flexeril, Xanax, Ambien, Mirapex, Percocet, glipizide, Lasix, Januvia  Technical Summary: A multichannel digital EEG recording measured by the international 10-20 system with electrodes applied with paste and impedances below 5000 ohms performed in our laboratory with EKG monitoring in an awake and asleep patient.  Hyperventilation and photic stimulation were performed.  The digital EEG was referentially recorded, reformatted, and digitally filtered in a variety of bipolar and referential montages for optimal display.  Spike detection software was employed.  Description: The patient is awake and asleep during the recording.  During maximal wakefulness, there is a symmetric, medium voltage 10-10.5 Hz posterior dominant rhythm that attenuates with eye opening.  The record is symmetric.  During drowsiness and sleep, there is an increase in theta slowing of the background, at times sharply contoured over the left temporal region without clear epileptogenic potential.  Vertex waves and symmetric sleep spindles were seen.  Hyperventilation and photic stimulation did not elicit any abnormalities.  There were no epileptiform discharges or electrographic seizures seen.    EKG lead was unremarkable.  Impression: This awake and asleep EEG is normal.    Clinical Correlation: A normal EEG does not exclude a clinical diagnosis of epilepsy. If further clinical questions remain, prolonged EEG may be helpful.  Clinical correlation is advised.   Ellouise Newer, M.D.

## 2014-01-09 ENCOUNTER — Other Ambulatory Visit: Payer: Self-pay | Admitting: Family Medicine

## 2014-01-14 ENCOUNTER — Other Ambulatory Visit: Payer: Self-pay | Admitting: Family Medicine

## 2014-01-19 ENCOUNTER — Ambulatory Visit (HOSPITAL_COMMUNITY)
Admission: RE | Admit: 2014-01-19 | Discharge: 2014-01-19 | Disposition: A | Discharge status: Home / Self Care | Payer: Medicare Other | Source: Ambulatory Visit | Attending: Neurology | Admitting: Neurology

## 2014-01-19 ENCOUNTER — Ambulatory Visit (HOSPITAL_COMMUNITY): Payer: Medicare Other

## 2014-01-19 DIAGNOSIS — R413 Other amnesia: Secondary | ICD-10-CM

## 2014-01-19 DIAGNOSIS — E785 Hyperlipidemia, unspecified: Secondary | ICD-10-CM | POA: Insufficient documentation

## 2014-01-19 DIAGNOSIS — I10 Essential (primary) hypertension: Secondary | ICD-10-CM | POA: Insufficient documentation

## 2014-01-19 DIAGNOSIS — R42 Dizziness and giddiness: Secondary | ICD-10-CM | POA: Insufficient documentation

## 2014-01-19 DIAGNOSIS — E119 Type 2 diabetes mellitus without complications: Secondary | ICD-10-CM | POA: Insufficient documentation

## 2014-01-19 DIAGNOSIS — R404 Transient alteration of awareness: Secondary | ICD-10-CM | POA: Insufficient documentation

## 2014-01-19 DIAGNOSIS — R51 Headache: Secondary | ICD-10-CM

## 2014-02-02 ENCOUNTER — Telehealth: Payer: Self-pay | Admitting: Neurology

## 2014-02-02 NOTE — Telephone Encounter (Signed)
Pt needs to talk to someone about test results [phone]

## 2014-02-02 NOTE — Telephone Encounter (Signed)
Unable to reach patient left message on answering machine to call back for results

## 2014-02-10 ENCOUNTER — Ambulatory Visit (INDEPENDENT_AMBULATORY_CARE_PROVIDER_SITE_OTHER): Payer: Medicare Other | Admitting: Family Medicine

## 2014-02-10 ENCOUNTER — Encounter: Payer: Self-pay | Admitting: Neurology

## 2014-02-10 ENCOUNTER — Ambulatory Visit (INDEPENDENT_AMBULATORY_CARE_PROVIDER_SITE_OTHER): Payer: Medicare Other | Admitting: Neurology

## 2014-02-10 ENCOUNTER — Telehealth: Payer: Self-pay | Admitting: Neurology

## 2014-02-10 ENCOUNTER — Encounter: Payer: Self-pay | Admitting: Family Medicine

## 2014-02-10 VITALS — BP 140/78 | HR 89 | Ht 68.0 in | Wt 240.0 lb

## 2014-02-10 VITALS — BP 144/82 | HR 103 | Temp 98.7°F | Ht 68.0 in | Wt 240.0 lb

## 2014-02-10 DIAGNOSIS — I10 Essential (primary) hypertension: Secondary | ICD-10-CM

## 2014-02-10 DIAGNOSIS — E119 Type 2 diabetes mellitus without complications: Secondary | ICD-10-CM

## 2014-02-10 DIAGNOSIS — R0600 Dyspnea, unspecified: Secondary | ICD-10-CM

## 2014-02-10 DIAGNOSIS — R0989 Other specified symptoms and signs involving the circulatory and respiratory systems: Secondary | ICD-10-CM

## 2014-02-10 DIAGNOSIS — R51 Headache: Secondary | ICD-10-CM

## 2014-02-10 DIAGNOSIS — R609 Edema, unspecified: Secondary | ICD-10-CM

## 2014-02-10 DIAGNOSIS — R42 Dizziness and giddiness: Secondary | ICD-10-CM

## 2014-02-10 DIAGNOSIS — R0602 Shortness of breath: Secondary | ICD-10-CM

## 2014-02-10 DIAGNOSIS — I2510 Atherosclerotic heart disease of native coronary artery without angina pectoris: Secondary | ICD-10-CM

## 2014-02-10 DIAGNOSIS — R0609 Other forms of dyspnea: Secondary | ICD-10-CM

## 2014-02-10 HISTORY — DX: Dizziness and giddiness: R42

## 2014-02-10 NOTE — Patient Instructions (Addendum)
1. Schedule earlier appointment with Dr. Sarajane Jews for shortness of breath with walking short distances and leg swelling. If symptoms worsen, go to ER immediately 2. Refer to ENT specialist for left nasal discharge  3. Refer to Cardiology for syncope 3. May take Tylenol as needed for headaches, do not take more than 2-3 a week

## 2014-02-10 NOTE — Progress Notes (Signed)
Pre visit review using our clinic review tool, if applicable. No additional management support is needed unless otherwise documented below in the visit note. 

## 2014-02-10 NOTE — Telephone Encounter (Signed)
Referral to [facility] for syncope

## 2014-02-10 NOTE — Progress Notes (Signed)
   Subjective:    Patient ID: Melanie Mcdonald, female    DOB: 1933/05/09, 80 y.o.   MRN: [id number]  HPI Here for leg swelling and SOB. When we saw her in March she was having some mild SOB and leg edema, so we increased her Lasix to 40 mg bid. She had a normal cardiac stress test in 2012, and she had an ECHO on 12-16-13 demonstrating severe LVH but normal systolic function. Her EF was 55-60%. We did labs that day showing a normal BNP and normal renal function. Unfortunately after taking this dose of lasix for several weeks she became frustrated with her frequent need to urinate so she stopped taking this completely about onr month ago. She has been slowly feeling worse ever since. No chest pain. She saw Neurology this morning and they asked her to see [place].    Review of Systems  Constitutional: Positive for fatigue.  Respiratory: Positive for shortness of breath. Negative for cough and wheezing.   Cardiovascular: Positive for leg swelling. Negative for chest pain and palpitations.       Objective:   Physical Exam  Constitutional:  Obese, very SOB on walking down the hall, using a cane  Neck: Neck supple. No thyromegaly present.  Cardiovascular: Normal rate, regular rhythm, normal heart sounds and intact distal pulses.   Pulmonary/Chest: No respiratory distress. She has no wheezes.  She has mild rales at both posterior lung bases  Musculoskeletal:  3+ edema in both lower legs up to the knees  Lymphadenopathy:    She has no cervical adenopathy.          Assessment & Plan:  She has fluid overload and most of this is due to medication non-compliance. I urged her to take her meds as directed and she agreed. We will start her back on Lasix 40 mg bid, and Dr. Delice Lesch has already arranged a Cardiology referral sometime soon. Recheck in 2 weeks with another BMET.

## 2014-02-10 NOTE — Addendum Note (Signed)
Addended by: Ella Jubilee on: 02/10/2014 02:08 PM   Modules accepted: Orders

## 2014-02-10 NOTE — Telephone Encounter (Signed)
Pt called stating that she changed her mind on [facility] and would like to be  Referred to [facility] heart care instead.

## 2014-02-10 NOTE — Progress Notes (Signed)
NEUROLOGY FOLLOW UP OFFICE NOTE  Melanie Mcdonald [id number]  HISTORY OF PRESENT ILLNESS: I had the pleasure of seeing Melanie Mcdonald in follow-up in the neurology clinic on 02/10/2014.  The patient was last seen 6 weeks ago for dizziness, loss of consciousness, headaches, and memory problems.  Records and images were personally reviewed where available.  I personally reviewed MRI brain/MRA head which were unremarkable, no evidence of acute or prior ischemia, there was mild intracranial atherosclerotic type changes without medium or large size vessel significant stenosis or occlusion.  Her routine EEG was normal.  Echo done in March 2013 showed increased LV wall thickness in a pattern of severe LVH, EF 55-60%, mild mitral stenosis, left atrium moderately dilated.  As I entered the room today, she was very short of breath stating that she was having a hard time breathing.  Vital signs were stable, lungs clear to auscultation.  She was able to talk in full sentences but having chest heaving.  She slowly calmed down and stated she was feeling better except that she has been very fatigued recently.  She has shortness of breath after walking some distance (from parking lot today).  She states that there have been no further episodes of loss of consciousness since she stopped some of her medications.  She has bipedal edema but does not take the Lasix regularly because it makes her go to the bathroom frequently.  She reports the bad headaches have resolved, however she has a mild frontal headache today. She did not sleep well last night because her home alarm went off twice. She has occasional dizziness.  She reports that she feels some congestion in her left nostril, and would have blood and pus come out from time to time.   HPI: This is a 80 yo RH woman with a 2-year history of dizziness. She recalls feeling dizzy at church with a headache and was brought home by friends. That afternoon, she fell into a ditch  near her home and was apparently there for hours because she could not move her legs. She was able to dig holes in the wall and slowly crawl out to get a passerby's attention. She was brought to the hospital and developed ?cellulitis in her left leg, requiring a boot on her left leg. As she was going out the door one time, she fell and injured her other leg. Since then,she stated having dizziness described as "like I cannot keep my balance." There is occasional nausea and vomiting. She reports horizontal diplopia when tired, especially at night when driving. She denies any dysarthria, dysphagia, focal numbness/tingling/weakness, reporting that both feet are numb. She started having "blackout spells" where she would wake up on the floor with no recollection of events, no prior warning symptoms. Initially she was having up to 2 episodes a day. She has hit her kitchen cabinet twice and broke it. She denies any associated dizziness or focal weakness around the time of passing out. She started having headaches in the occipital region occurring daily. She reports episodes of passing out stopped after she self-discontinued her medications. Last episode occurred the week prior to her initial visit at the end of March 2015 when she woke up on the floor.   PAST MEDICAL HISTORY: Past Medical History  Diagnosis Date  . ANEMIA 01/30/2008  . ARTHRITIS 12/11/2007  . BACK PAIN, CHRONIC 06/23/2008  . BREAST CYST, RIGHT 12/17/2007  . CONTACT DERMATITIS 03/10/2009  . CORONARY ARTERY DISEASE 03/01/2007  had a normal Myoview stress test 07-13-11  . CYSTOCELE WITHOUT MENTION UTERINE PROLAPSE LAT 09/12/2010  . DEGENERATIVE JOINT DISEASE 08/07/2007  . DEPRESSION 03/01/2007  . DIABETES MELLITUS, TYPE II 08/26/2008  . Esophageal reflux 11/06/2007  . HYPERKERATOSIS 06/02/2009  . HYPERLIPIDEMIA 08/26/2008  . HYPERTENSION 03/01/2007  . Irritable bowel syndrome 01/26/2009  . LABYRINTHITIS 11/03/2009  . Narcolepsy without cataplexy(347.00)  08/26/2008  . OBESITY 03/01/2007  . PERIPHERAL VASCULAR DISEASE 01/26/2009  . SYNCOPE 08/26/2008    had brain MRI on 06-28-12 showing only chronic microvascular ischemia and atrophy   . TRANSIENT ISCHEMIC ATTACK 10/20/2009    had normal brain MRA with patent vertebrals and carotids 06-28-12  . NEPHROLITHIASIS 04/29/2009  . COPD (chronic obstructive pulmonary disease)     MEDICATIONS: Current Outpatient Prescriptions on File Prior to Visit  Medication Sig Dispense Refill  . cyclobenzaprine (FLEXERIL) 10 MG tablet Take 1 tablet by mouth 3 (three) times daily.      . diclofenac sodium (VOLTAREN) 1 % GEL Apply 4 g topically 4 (four) times daily as needed. For joint pain.  300 g  11  . furosemide (LASIX) 40 MG tablet Take 1 tablet (40 mg total) by mouth 2 (two) times daily.  60 tablet  5  . glipiZIDE (GLUCOTROL XL) 10 MG 24 hr tablet Take 1 tablet (10 mg total) by mouth 2 (two) times daily.  60 tablet  11  . Insulin Pen Needle (B-D ULTRAFINE III SHORT PEN) 31G X 8 MM MISC Use as directed.  100 each  5  . LANTUS SOLOSTAR 100 UNIT/ML Solostar Pen INJECT 100 UNITS UNDER THE SKIN AT BEDTIME  90 mL  1  . nitroGLYCERIN (NITROSTAT) 0.4 MG SL tablet Place 1 tablet (0.4 mg total) under the tongue every 5 (five) minutes as needed for chest pain.  50 tablet  5  . oxyCODONE-acetaminophen (PERCOCET) 10-325 MG per tablet Take 1 tablet by mouth every 6 (six) hours as needed for pain (pain).  120 tablet  0  . phenazopyridine (PYRIDIUM) 200 MG tablet Take 1 tablet (200 mg total) by mouth 3 (three) times daily as needed for pain.  60 tablet  5  . pramipexole (MIRAPEX) 1 MG tablet Take 3 tablets (3 mg total) by mouth at bedtime.  30 tablet  5  . pramipexole (MIRAPEX) 1 MG tablet TAKE 1 TABLET BY MOUTH THREE TIMES DAILY  90 tablet  3  . promethazine (PHENERGAN) 25 MG tablet Take 1 tablet (25 mg total) by mouth every 6 (six) hours as needed for nausea (nausea). For nausea.  60 tablet  5  . sitaGLIPtin (JANUVIA) 100 MG  tablet Take 1 tablet (100 mg total) by mouth daily.  30 tablet  11  . zolpidem (AMBIEN) 10 MG tablet TAKE 1 TABLET BY MOUTH ONCE DAILY AT BEDTIME AS NEEDED  30 tablet  5   No current facility-administered medications on file prior to visit.    ALLERGIES: Allergies  Allergen Reactions  . Aspirin     Abdominal pain  . Doxycycline Itching  . Erythromycin Nausea And Vomiting  . Penicillins Swelling  . Sulfonamide Derivatives Nausea And Vomiting    FAMILY HISTORY: Family History  Problem Relation Age of Onset  . Lupus Mother   . COPD Father     SOCIAL HISTORY: History   Social History  . Marital Status: Widowed    Spouse Name: N/A    Number of Children: N/A  . Years of Education: N/A   Occupational History  .  Not on file.   Social History Main Topics  . Smoking status: Never Smoker   . Smokeless tobacco: Never Used  . Alcohol Use: No  . Drug Use: No  . Sexual Activity: No   Other Topics Concern  . Not on file   Social History Narrative  . No narrative on file    REVIEW OF SYSTEMS: Constitutional: No fevers, chills, or sweats, no generalized fatigue, change in appetite Eyes: No visual changes, double vision, eye pain Ear, nose and throat: No hearing loss, ear pain, nasal congestion, sore throat Cardiovascular: No chest pain, palpitations Respiratory:  + shortness of breath at rest or with exertion, no wheezes GastrointestinaI: No nausea, vomiting, diarrhea, abdominal pain, fecal incontinence Genitourinary:  No dysuria, urinary retention or frequency Musculoskeletal:  No neck pain, back pain Integumentary: No rash, pruritus, skin lesions Neurological: as above Psychiatric: No depression, insomnia, anxiety Endocrine: No palpitations, fatigue, diaphoresis, mood swings, change in appetite, change in weight, increased thirst Hematologic/Lymphatic:  No anemia, purpura, petechiae. Allergic/Immunologic: no itchy/runny eyes, nasal congestion, recent allergic  reactions, rashes  PHYSICAL EXAM: Filed Vitals:   02/10/14 0934  BP: 140/78  Pulse: 89   General: No acute distress, appears depressed.  She was initially in mild respiratory distress at the start of the visit, then normalized after a few minutes and felt better. Head:  Normocephalic/atraumatic Neck: supple, no paraspinal tenderness, full range of motion Heart:  Regular rate and rhythm Lungs:  Clear to auscultation bilaterally Back: No paraspinal tenderness Skin/Extremities: No rash, no edema Neurological Exam: alert and oriented to person, place, and time. No aphasia or dysarthria. Fund of knowledge is appropriate.  Recent and remote memory are intact.  Attention and concentration are normal.    Able to name objects and repeat phrases. Cranial nerves: Pupils equal, round, reactive to light.  Fundoscopic exam unremarkable, no papilledema. Extraocular movements intact with no nystagmus. Visual fields full. Facial sensation intact. No facial asymmetry. Tongue, uvula, palate midline.  Motor: Bulk and tone normal, muscle strength 5/5 throughout with no pronator drift.  Decreased sensation in a stocking distribution bilaterally.Deep Tendon Reflexes: brisk +3 on left UE, +2 on right UE, both patella, absent bilateral ankle jerks. no ankle clonus, negative Hoffman's sign Plantar responses: downgoing bilaterally Cerebellar: no incoordination on finger to nose Gait: narrow-based and steady, ambulates with cane.Tremor: none  IMPRESSION: This is a 80 yo RH woman with a history of hypertension, diabetes, RLS, presenting for multiple neurological symptoms, including dizziness, headaches, memory loss, and episodes of loss of consciousness. Her brain MRI is normal, MRA head does not show evidence of vertebrobasilar compromise.  Her routine EEG is normal.  She was very short of breath today after walking from the parking lot, with bipedal edema.  Lungs clear to auscultation.  I reviewed her echo, there is note of  severe LVH, normal EF.  I would recommend a cardiology evaluation for dyspnea on exertion, dizziness, in addition to the syncopal episodes.  There is no clear neurological cause for her symptoms at this time.  Headaches may be related to nasal congestion with report of intemittent left nasal discharge (pus and blood). She will be referred to ENT for evaluation.  She was instructed to follow-up earlier with her PCP regarding the above symptoms or go to ER if they worsen. She is aware of [place] driving laws, she should not drive after any episode of loss of consciousness/awareness until 6 months event-free. She will follow-up in 6 months.  Thank you for allowing me to participate in her care.  Please do not hesitate to call for any questions or concerns.  The duration of this appointment visit was 15 minutes of face-to-face time with the patient.  Greater than 50% of this time was spent in counseling, explanation of diagnosis, planning of further management, and coordination of care.   Ellouise Newer, M.D.

## 2014-02-11 ENCOUNTER — Telehealth: Payer: Self-pay | Admitting: Family Medicine

## 2014-02-11 NOTE — Telephone Encounter (Signed)
[facility] Triage Call Report Triage Record Num: [id number] Operator: Natasha Bence Patient Name: Melanie Mcdonald Call Date & Time: 02/10/2014 7:03:32PM Patient Phone: [phone] PCP: Carolann Littler Patient Gender: Female PCP Fax : [phone] Patient DOB: 1933-02-05 Practice Name: [facility] Reason for Call: Caller: [name]; PCP: Alysia Penna (Family Practice); CB#: [phone]; Call regarding medication issue. Pt states she was seen by Dr Sarajane Jews today 5/12 and prescribed Lasix 40mg  BID for edema in legs. Pt states the medication was not called in to the pharmacy. Verified prescription via [facility] and called in Lasix 40mg  BID disp 60, no refills to [facility]. Spoke with pharmacist and med will be ready for pick up this evening. Informed pt she may pick medication up and to call the office when she is within a week of being out of medication to have refills called in. Pt verbalized understanding. Protocol(s) Used: Medication Questions - Adult Recommended Outcome per Protocol: Provided Health Information Reason for Outcome: Caller has medication question(s) that was answered with available resources

## 2014-02-13 ENCOUNTER — Encounter (HOSPITAL_COMMUNITY): Payer: Self-pay | Admitting: Emergency Medicine

## 2014-02-13 ENCOUNTER — Emergency Department (HOSPITAL_COMMUNITY)
Admission: EM | Admit: 2014-02-13 | Discharge: 2014-02-13 | Disposition: A | Discharge status: Home / Self Care | Payer: Medicare Other | Attending: Emergency Medicine | Admitting: Emergency Medicine

## 2014-02-13 ENCOUNTER — Emergency Department (HOSPITAL_COMMUNITY): Payer: Medicare Other

## 2014-02-13 ENCOUNTER — Telehealth: Payer: Self-pay | Admitting: Family Medicine

## 2014-02-13 DIAGNOSIS — I2510 Atherosclerotic heart disease of native coronary artery without angina pectoris: Secondary | ICD-10-CM | POA: Insufficient documentation

## 2014-02-13 DIAGNOSIS — Z872 Personal history of diseases of the skin and subcutaneous tissue: Secondary | ICD-10-CM | POA: Insufficient documentation

## 2014-02-13 DIAGNOSIS — M129 Arthropathy, unspecified: Secondary | ICD-10-CM | POA: Insufficient documentation

## 2014-02-13 DIAGNOSIS — Z880 Allergy status to penicillin: Secondary | ICD-10-CM | POA: Insufficient documentation

## 2014-02-13 DIAGNOSIS — Z8719 Personal history of other diseases of the digestive system: Secondary | ICD-10-CM | POA: Insufficient documentation

## 2014-02-13 DIAGNOSIS — R0600 Dyspnea, unspecified: Secondary | ICD-10-CM

## 2014-02-13 DIAGNOSIS — G8929 Other chronic pain: Secondary | ICD-10-CM | POA: Insufficient documentation

## 2014-02-13 DIAGNOSIS — I739 Peripheral vascular disease, unspecified: Secondary | ICD-10-CM | POA: Insufficient documentation

## 2014-02-13 DIAGNOSIS — J4489 Other specified chronic obstructive pulmonary disease: Secondary | ICD-10-CM | POA: Insufficient documentation

## 2014-02-13 DIAGNOSIS — IMO0002 Reserved for concepts with insufficient information to code with codable children: Secondary | ICD-10-CM | POA: Insufficient documentation

## 2014-02-13 DIAGNOSIS — E119 Type 2 diabetes mellitus without complications: Secondary | ICD-10-CM | POA: Insufficient documentation

## 2014-02-13 DIAGNOSIS — Z8673 Personal history of transient ischemic attack (TIA), and cerebral infarction without residual deficits: Secondary | ICD-10-CM | POA: Insufficient documentation

## 2014-02-13 DIAGNOSIS — J449 Chronic obstructive pulmonary disease, unspecified: Secondary | ICD-10-CM | POA: Insufficient documentation

## 2014-02-13 DIAGNOSIS — E669 Obesity, unspecified: Secondary | ICD-10-CM | POA: Insufficient documentation

## 2014-02-13 DIAGNOSIS — Z862 Personal history of diseases of the blood and blood-forming organs and certain disorders involving the immune mechanism: Secondary | ICD-10-CM | POA: Insufficient documentation

## 2014-02-13 DIAGNOSIS — R609 Edema, unspecified: Secondary | ICD-10-CM | POA: Insufficient documentation

## 2014-02-13 DIAGNOSIS — Z87442 Personal history of urinary calculi: Secondary | ICD-10-CM | POA: Insufficient documentation

## 2014-02-13 DIAGNOSIS — Z8742 Personal history of other diseases of the female genital tract: Secondary | ICD-10-CM | POA: Insufficient documentation

## 2014-02-13 DIAGNOSIS — Z79899 Other long term (current) drug therapy: Secondary | ICD-10-CM | POA: Insufficient documentation

## 2014-02-13 DIAGNOSIS — Z9889 Other specified postprocedural states: Secondary | ICD-10-CM | POA: Insufficient documentation

## 2014-02-13 DIAGNOSIS — I10 Essential (primary) hypertension: Secondary | ICD-10-CM | POA: Insufficient documentation

## 2014-02-13 LAB — CBC
HEMATOCRIT: 40.5 % (ref 36.0–46.0)
Hemoglobin: 13.4 g/dL (ref 12.0–15.0)
MCH: 29.3 pg (ref 26.0–34.0)
MCHC: 33.1 g/dL (ref 30.0–36.0)
MCV: 88.6 fL (ref 78.0–100.0)
PLATELETS: 235 10*3/uL (ref 150–400)
RBC: 4.57 MIL/uL (ref 3.87–5.11)
RDW: 13.5 % (ref 11.5–15.5)
WBC: 7.8 10*3/uL (ref 4.0–10.5)

## 2014-02-13 LAB — BASIC METABOLIC PANEL
BUN: 25 mg/dL — ABNORMAL HIGH (ref 6–23)
CHLORIDE: 98 meq/L (ref 96–112)
CO2: 28 mEq/L (ref 19–32)
Calcium: 9.4 mg/dL (ref 8.4–10.5)
Creatinine, Ser: 1.14 mg/dL — ABNORMAL HIGH (ref 0.50–1.10)
GFR calc Af Amer: 52 mL/min — ABNORMAL LOW (ref >90)
GFR calc non Af Amer: 45 mL/min — ABNORMAL LOW (ref >90)
Glucose, Bld: 249 mg/dL — ABNORMAL HIGH (ref 70–99)
POTASSIUM: 4.1 meq/L (ref 3.7–5.3)
Sodium: 140 mEq/L (ref 137–147)

## 2014-02-13 LAB — I-STAT TROPONIN, ED: TROPONIN I, POC: 0 ng/mL (ref 0.00–0.08)

## 2014-02-13 LAB — D-DIMER, QUANTITATIVE (NOT AT ARMC): D DIMER QUANT: 0.5 ug{FEU}/mL — AB (ref 0.00–0.48)

## 2014-02-13 LAB — PRO B NATRIURETIC PEPTIDE: PRO B NATRI PEPTIDE: 75.1 pg/mL (ref 0–450)

## 2014-02-13 NOTE — Telephone Encounter (Signed)
Patient Information:  Caller Name: Melanie Mcdonald  Phone: [phone]  Patient: Melanie Mcdonald, Melanie Mcdonald  Gender: Female  DOB: 05/06/1933  Age: 80 Years  PCP: Alysia Penna [facility])  Office Follow Up:  Does the office need to follow up with this patient?: No  Instructions For The Office: N/A  RN Note:  Office heads up that pt sent to ED- No appt in the office; based on pt's sx, insturcted to go to ED now with friend driving; pt reluctant about going to ED; explained that these sx have been there and are progressively getting worse and now she can not carry out her ADL's; pt was seen by a physican on 02/12/14 that recommended ED but she refused at that time as well;  voiced understanding and will go to [facility] ED with friend driving; explained to call 911 if unable to find a driver; friend is with pt now  Symptoms  Reason For Call & Symptoms: Pt is calling and states that she is having difficulty breathing;  was dx with fluid around her heart on 02/10/14; Lasix was started;  diff breathing started approx 1 week ago; but worse since 02/11/14;  can not walk upsteps or drive now because of the breathing issues;   when she lays down she feels like she is suffocating; caller is just wanting to know what she can take to help with her breathing  Reviewed Health History In EMR: Yes  Reviewed Medications In EMR: Yes  Reviewed Allergies In EMR: Yes  Reviewed Surgeries / Procedures: Yes  Date of Onset of Symptoms: 02/11/2014  Treatments Tried: Lasix  Treatments Tried Worked: No  Guideline(s) Used:  Breathing Difficulty  Disposition Per Guideline:   Go to ED Now  Reason For Disposition Reached:   Moderate difficulty breathing (e.g., speaks in phrases, SOB even at rest, pulse 100-120) of new onset or worse than normal  Advice Given:  N/A  Patient Will Follow Care Advice:  YES

## 2014-02-13 NOTE — ED Notes (Signed)
Pt reports that she has been SOB for the past couple of days. States that she was told by her PCP that she has fluid on her lungs. States that she was given lasix but has not taken them today. States her PCP wanted her to come here for further evaluation.

## 2014-02-13 NOTE — ED Notes (Signed)
Pt reporting clear drainage from BLE at night.

## 2014-02-13 NOTE — ED Provider Notes (Signed)
CSN: [id number]     Arrival date & time 02/13/14  1445 History   First MD Initiated Contact with Patient 02/13/14 1512     Chief Complaint  Patient presents with  . Shortness of Breath     (Consider location/radiation/quality/duration/timing/severity/associated sxs/prior Treatment) Patient is a 80 y.o. female presenting with shortness of breath. The history is provided by the patient.  Shortness of Breath  She complains of shortness of breath, that is gradually worse over 3 days time. She saw her PCP 3 days ago and he put her on Lasix for peripheral edema. She did not take the Lasix this morning. She is worried that she has fluid in her lungs. She has been able to eat. She has occasional nonproductive cough. She denies fever, chills, nausea, vomiting, localized weakness, or paresthesia. She has had decreased activity for 18 months since she fell, in a ditch. She lives alone. She is taking her other medication as prescribed. There are no other known modifying factors.  Past Medical History  Diagnosis Date  . ANEMIA 01/30/2008  . ARTHRITIS 12/11/2007  . BACK PAIN, CHRONIC 06/23/2008  . BREAST CYST, RIGHT 12/17/2007  . CONTACT DERMATITIS 03/10/2009  . CORONARY ARTERY DISEASE 03/01/2007    had a normal Myoview stress test 07-13-11  . CYSTOCELE WITHOUT MENTION UTERINE PROLAPSE LAT 09/12/2010  . DEGENERATIVE JOINT DISEASE 08/07/2007  . DEPRESSION 03/01/2007  . DIABETES MELLITUS, TYPE II 08/26/2008  . Esophageal reflux 11/06/2007  . HYPERKERATOSIS 06/02/2009  . HYPERLIPIDEMIA 08/26/2008  . HYPERTENSION 03/01/2007  . Irritable bowel syndrome 01/26/2009  . LABYRINTHITIS 11/03/2009  . 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 OBESITY 03/01/2007  . PERIPHERAL VASCULAR DISEASE 01/26/2009  . SYNCOPE 08/26/2008    had brain MRI on 06-28-12 showing only chronic microvascular ischemia and atrophy   . TRANSIENT ISCHEMIC ATTACK 10/20/2009    had normal brain MRA with patent vertebrals and carotids 06-28-12   . NEPHROLITHIASIS 04/29/2009  . COPD (chronic obstructive pulmonary disease)    Past Surgical History  Procedure Laterality Date  . Cataract extraction    . Abdominal hysterectomy    . Back surgery      x2  . Tonsilectomy, adenoidectomy, bilateral myringotomy and tubes    . Spine surgery      x 3  . Cardiac catheterization  07/2009  . Intraocular lens insertion    . Esophagogastroduodenoscopy  11-13-07    per Dr. Deatra Ina, normal   . Colonoscopy  11-13-07    per Dr. Deatra Ina, benign polyps, repeat in 5 yrs   Family History  Problem Relation Age of Onset  . Lupus Mother   . COPD Father    History  Substance Use Topics  . Smoking status: Never Smoker   . Smokeless tobacco: Never Used  . Alcohol Use: No   OB History   Grav Para Term Preterm Abortions TAB SAB Ect Mult Living                 Review of Systems  Respiratory: Positive for shortness of breath.   All other systems reviewed and are negative.     Allergies  Aspirin; Doxycycline; Erythromycin; Penicillins; and Sulfonamide derivatives  Home Medications   Prior to Admission medications   Medication Sig Start Date End Date Taking? Authorizing Provider  cyclobenzaprine (FLEXERIL) 10 MG tablet Take 1 tablet by mouth 3 (three) times daily. 04/07/13   Historical Provider, MD  diclofenac sodium (VOLTAREN) 1 % GEL Apply 4 g topically 4 (four) times daily  as needed. For joint pain. 04/07/13   Laurey Morale, MD  furosemide (LASIX) 40 MG tablet Take 1 tablet (40 mg total) by mouth 2 (two) times daily. 12/10/13   Laurey Morale, MD  glipiZIDE (GLUCOTROL XL) 10 MG 24 hr tablet Take 1 tablet (10 mg total) by mouth 2 (two) times daily. 07/16/13   Laurey Morale, MD  Insulin Pen Needle (B-D ULTRAFINE III SHORT PEN) 31G X 8 MM MISC Use as directed. 07/16/13   Laurey Morale, MD  nitroGLYCERIN (NITROSTAT) 0.4 MG SL tablet Place 1 tablet (0.4 mg total) under the tongue every 5 (five) minutes as needed for chest pain. 07/29/13   Laurey Morale, MD  oxyCODONE-acetaminophen (PERCOCET) 10-325 MG per tablet Take 1 tablet by mouth every 6 (six) hours as needed for pain (pain). 12/10/13   Laurey Morale, MD  phenazopyridine (PYRIDIUM) 200 MG tablet Take 1 tablet (200 mg total) by mouth 3 (three) times daily as needed for pain. 07/29/13   Laurey Morale, MD  pramipexole (MIRAPEX) 1 MG tablet Take 3 tablets (3 mg total) by mouth at bedtime. 07/16/13   Laurey Morale, MD  promethazine (PHENERGAN) 25 MG tablet Take 1 tablet (25 mg total) by mouth every 6 (six) hours as needed for nausea (nausea). For nausea. 05/22/13   Laurey Morale, MD  sitaGLIPtin (JANUVIA) 100 MG tablet Take 1 tablet (100 mg total) by mouth daily. 12/16/13   Laurey Morale, MD   BP 120/68  Pulse 96  Temp(Src) 98.8 F (37.1 C) (Oral)  Resp 18  Ht 5\' 8"  (1.727 m)  Wt 240 lb (108.863 kg)  BMI 36.50 kg/m2  SpO2 94% Physical Exam  Nursing note and vitals reviewed. Constitutional: She is oriented to person, place, and time. She appears well-developed. No distress.  Elderly, frail  HENT:  Head: Normocephalic and atraumatic.  Eyes: Conjunctivae and EOM are normal. Pupils are equal, round, and reactive to light.  Neck: Normal range of motion and phonation normal. Neck supple.  Cardiovascular: Normal rate, regular rhythm and intact distal pulses.   Pulmonary/Chest: Effort normal and breath sounds normal. No respiratory distress. She exhibits no tenderness.  Abdominal: Soft. She exhibits no distension. There is no tenderness. There is no guarding.  Musculoskeletal: Normal range of motion. She exhibits edema (3+ peripheral edema, bilaterally, no isolated tenderness of the lower legs.).  Neurological: She is alert and oriented to person, place, and time. No cranial nerve deficit. She exhibits normal muscle tone. Coordination normal.  Skin: Skin is warm and dry.  Psychiatric: She has a normal mood and affect. Her behavior is normal. Judgment and thought content normal.    ED  Course  Procedures (including critical care time)  Medications - No data to display  Patient Vitals for the past 24 hrs:  BP Temp Temp src Pulse Resp SpO2 Height Weight  02/13/14 1451 120/68 mmHg 98.8 F (37.1 C) Oral 96 18 94 % 5\' 8"  (1.727 m) 240 lb (108.863 kg)    5:00 PM Reevaluation with update and discussion. After initial assessment and treatment, an updated evaluation reveals she is comfortable. She's been able and willing to the bathroom without difficulty, and is drinking water, now. Findings discussed with patient and daughter, all questions answered. Richarda Blade   Labs Review Labs Reviewed  BASIC METABOLIC PANEL - Abnormal; Notable for the following:    Glucose, Bld 249 (*)    BUN 25 (*)    Creatinine,  Ser 1.14 (*)    GFR calc non Af Amer 45 (*)    GFR calc Af Amer 52 (*)    All other components within normal limits  D-DIMER, QUANTITATIVE - Abnormal; Notable for the following:    D-Dimer, Quant 0.50 (*)    All other components within normal limits  CBC  PRO B NATRIURETIC PEPTIDE  I-STAT TROPOININ, ED    Imaging Review Dg Chest 2 View  02/13/2014   CLINICAL DATA:  Swelling and shortness of breath  EXAM: CHEST  2 VIEW  COMPARISON:  09/09/2012  FINDINGS: Normal heart size and vascularity. Slight prominence of the interstitial markings diffusely, nonspecific. Minimal left base atelectasis versus scarring. No definite edema, pneumonia, collapse or consolidation. No effusion or pneumothorax. Trachea midline. Atherosclerosis noted of the aorta. Degenerative changes of the spine.  IMPRESSION: Stable chest exam.  No superimposed acute process.  Left base atelectasis versus scarring   Electronically Signed   By: Daryll Brod M.D.   On: 02/13/2014 15:44     EKG Interpretation None      MDM   Final diagnoses:  Peripheral edema  Dyspnea    Peripheral edema, with nonspecific shortness of breath. BNP is normal. D-dimer, is normal, age-adjusted. I doubt that she has  congestive heart failure. No evidence for pneumonia, bacterial infection or metabolic instability. She is likely mildly intravascularly dehydrated secondary to recent initiation of Lasix treatment for peripheral edema.   Nursing Notes Reviewed/ Care Coordinated Applicable Imaging Reviewed Interpretation of Laboratory Data incorporated into ED treatment  The patient appears reasonably screened and/or stabilized for discharge and I doubt any other medical condition or other [facility] requiring further screening, evaluation, or treatment in the ED at this time prior to discharge.  Plan: Home Medications- usual; Home Treatments- rest, elevate feet as much as possible.; return here if the recommended treatment, does not improve the symptoms; Recommended follow up- PCP, for check up in 1 week   Richarda Blade, MD 02/14/14 [phone]

## 2014-02-13 NOTE — Telephone Encounter (Signed)
Pt said Dr Sarajane Jews was suppose to call her before the week is out and she is awaiting that call.  She is having a lot a nausea and  would like to know if the lasix could be causing this the patient said she is also having trouble breathing  i transferred her to triage for the breathing issues.

## 2014-02-13 NOTE — Telephone Encounter (Signed)
I understand the triage nurse advised her to go to the ER, and I agree

## 2014-02-13 NOTE — Telephone Encounter (Signed)
Noted  

## 2014-02-13 NOTE — Discharge Instructions (Signed)
Elevate your feet above your heart, when you are sitting. Try to get some exercise each day. Sure you are drinking, and eating regularly.     Edema Edema is an abnormal build-up of fluids in tissues. Because this is partly dependent on gravity (water flows to the lowest place), it is more common in the legs and thighs (lower extremities). It is also common in the looser tissues, like around the eyes. Painless swelling of the feet and ankles is common and increases as a person ages. It may affect both legs and may include the calves or even thighs. When squeezed, the fluid may move out of the affected area and may leave a dent for a few moments. CAUSES   Prolonged standing or sitting in one place for extended periods of time. Movement helps pump tissue fluid into the veins, and absence of movement prevents this, resulting in edema.  Varicose veins. The valves in the veins do not work as well as they should. This causes fluid to leak into the tissues.  Fluid and salt overload.  Injury, burn, or surgery to the leg, ankle, or foot, may damage veins and allow fluid to leak out.  Sunburn damages vessels. Leaky vessels allow fluid to go out into the sunburned tissues.  Allergies (from insect bites or stings, medications or chemicals) cause swelling by allowing vessels to become leaky.  Protein in the blood helps keep fluid in your vessels. Low protein, as in malnutrition, allows fluid to leak out.  Hormonal changes, including pregnancy and menstruation, cause fluid retention. This fluid may leak out of vessels and cause edema.  Medications that cause fluid retention. Examples are sex hormones, blood pressure medications, steroid treatment, or anti-depressants.  Some illnesses cause edema, especially heart failure, kidney disease, or liver disease.  Surgery that cuts veins or lymph nodes, such as surgery done for the heart or for breast cancer, may result in edema. DIAGNOSIS  Your caregiver  is usually easily able to determine what is causing your swelling (edema) by simply asking what is wrong (getting a history) and examining you (doing a physical). Sometimes x-rays, EKG (electrocardiogram or heart tracing), and blood work may be done to evaluate for underlying medical illness. TREATMENT  General treatment includes:  Leg elevation (or elevation of the affected body part).  Restriction of fluid intake.  Prevention of fluid overload.  Compression of the affected body part. Compression with elastic bandages or support stockings squeezes the tissues, preventing fluid from entering and forcing it back into the blood vessels.  Diuretics (also called water pills or fluid pills) pull fluid out of your body in the form of increased urination. These are effective in reducing the swelling, but can have side effects and must be used only under your caregiver's supervision. Diuretics are appropriate only for some types of edema. The specific treatment can be directed at any underlying causes discovered. Heart, liver, or kidney disease should be treated appropriately. HOME CARE INSTRUCTIONS   Elevate the legs (or affected body part) above the level of the heart, while lying down.  Avoid sitting or standing still for prolonged periods of time.  Avoid putting anything directly under the knees when lying down, and do not wear constricting clothing or garters on the upper legs.  Exercising the legs causes the fluid to work back into the veins and lymphatic channels. This may help the swelling go down.  The pressure applied by elastic bandages or support stockings can help reduce ankle swelling.  A low-salt diet may help reduce fluid retention and decrease the ankle swelling.  Take any medications exactly as prescribed. SEEK MEDICAL CARE IF:  Your edema is not responding to recommended treatments. SEEK IMMEDIATE MEDICAL CARE IF:   You develop shortness of breath or chest pain.  You  cannot breathe when you lay down; or if, while lying down, you have to get up and go to the window to get your breath.  You are having increasing swelling without relief from treatment.  You develop a fever over 102 F (38.9 C).  You develop pain or redness in the areas that are swollen.  Tell your caregiver right away if you have gained 03 lb/1.4 kg in 1 day or 05 lb/2.3 kg in a week. MAKE SURE YOU:   Understand these instructions.  Will watch your condition.  Will get help right away if you are not doing well or get worse. Document Released: 09/18/2005 Document Revised: 03/19/2012 Document Reviewed: 05/06/2008 [facility] Patient Information 2014 [place].

## 2014-02-19 ENCOUNTER — Other Ambulatory Visit: Payer: Self-pay | Admitting: Family Medicine

## 2014-02-20 NOTE — Telephone Encounter (Signed)
Call in #30 with 5 rf 

## 2014-02-23 ENCOUNTER — Encounter (HOSPITAL_COMMUNITY): Payer: Self-pay | Admitting: Emergency Medicine

## 2014-02-23 ENCOUNTER — Emergency Department (HOSPITAL_COMMUNITY): Payer: Medicare Other

## 2014-02-23 ENCOUNTER — Emergency Department (HOSPITAL_COMMUNITY)
Admission: EM | Admit: 2014-02-23 | Discharge: 2014-02-23 | Disposition: A | Discharge status: Home / Self Care | Payer: Medicare Other | Attending: Emergency Medicine | Admitting: Emergency Medicine

## 2014-02-23 DIAGNOSIS — G8929 Other chronic pain: Secondary | ICD-10-CM | POA: Insufficient documentation

## 2014-02-23 DIAGNOSIS — J441 Chronic obstructive pulmonary disease with (acute) exacerbation: Secondary | ICD-10-CM | POA: Insufficient documentation

## 2014-02-23 DIAGNOSIS — Z87442 Personal history of urinary calculi: Secondary | ICD-10-CM | POA: Insufficient documentation

## 2014-02-23 DIAGNOSIS — Z872 Personal history of diseases of the skin and subcutaneous tissue: Secondary | ICD-10-CM | POA: Insufficient documentation

## 2014-02-23 DIAGNOSIS — E669 Obesity, unspecified: Secondary | ICD-10-CM | POA: Insufficient documentation

## 2014-02-23 DIAGNOSIS — Z794 Long term (current) use of insulin: Secondary | ICD-10-CM | POA: Insufficient documentation

## 2014-02-23 DIAGNOSIS — R109 Unspecified abdominal pain: Secondary | ICD-10-CM

## 2014-02-23 DIAGNOSIS — R739 Hyperglycemia, unspecified: Secondary | ICD-10-CM

## 2014-02-23 DIAGNOSIS — Z8742 Personal history of other diseases of the female genital tract: Secondary | ICD-10-CM | POA: Insufficient documentation

## 2014-02-23 DIAGNOSIS — Z8669 Personal history of other diseases of the nervous system and sense organs: Secondary | ICD-10-CM | POA: Insufficient documentation

## 2014-02-23 DIAGNOSIS — I10 Essential (primary) hypertension: Secondary | ICD-10-CM | POA: Insufficient documentation

## 2014-02-23 DIAGNOSIS — K7689 Other specified diseases of liver: Secondary | ICD-10-CM | POA: Insufficient documentation

## 2014-02-23 DIAGNOSIS — I2510 Atherosclerotic heart disease of native coronary artery without angina pectoris: Secondary | ICD-10-CM | POA: Insufficient documentation

## 2014-02-23 DIAGNOSIS — Z9889 Other specified postprocedural states: Secondary | ICD-10-CM | POA: Insufficient documentation

## 2014-02-23 DIAGNOSIS — Z79899 Other long term (current) drug therapy: Secondary | ICD-10-CM | POA: Insufficient documentation

## 2014-02-23 DIAGNOSIS — Z8673 Personal history of transient ischemic attack (TIA), and cerebral infarction without residual deficits: Secondary | ICD-10-CM | POA: Insufficient documentation

## 2014-02-23 DIAGNOSIS — Z880 Allergy status to penicillin: Secondary | ICD-10-CM | POA: Insufficient documentation

## 2014-02-23 DIAGNOSIS — E119 Type 2 diabetes mellitus without complications: Secondary | ICD-10-CM | POA: Insufficient documentation

## 2014-02-23 DIAGNOSIS — Z862 Personal history of diseases of the blood and blood-forming organs and certain disorders involving the immune mechanism: Secondary | ICD-10-CM | POA: Insufficient documentation

## 2014-02-23 DIAGNOSIS — Z8659 Personal history of other mental and behavioral disorders: Secondary | ICD-10-CM | POA: Insufficient documentation

## 2014-02-23 DIAGNOSIS — R42 Dizziness and giddiness: Secondary | ICD-10-CM | POA: Insufficient documentation

## 2014-02-23 DIAGNOSIS — K760 Fatty (change of) liver, not elsewhere classified: Secondary | ICD-10-CM

## 2014-02-23 DIAGNOSIS — M129 Arthropathy, unspecified: Secondary | ICD-10-CM | POA: Insufficient documentation

## 2014-02-23 LAB — CBC WITH DIFFERENTIAL/PLATELET
BASOS ABS: 0 10*3/uL (ref 0.0–0.1)
Basophils Relative: 0 % (ref 0–1)
Eosinophils Absolute: 0.2 10*3/uL (ref 0.0–0.7)
Eosinophils Relative: 2 % (ref 0–5)
HCT: 38.9 % (ref 36.0–46.0)
Hemoglobin: 13 g/dL (ref 12.0–15.0)
Lymphocytes Relative: 33 % (ref 12–46)
Lymphs Abs: 2.9 10*3/uL (ref 0.7–4.0)
MCH: 29.5 pg (ref 26.0–34.0)
MCHC: 33.4 g/dL (ref 30.0–36.0)
MCV: 88.4 fL (ref 78.0–100.0)
Monocytes Absolute: 0.4 10*3/uL (ref 0.1–1.0)
Monocytes Relative: 5 % (ref 3–12)
NEUTROS ABS: 5.3 10*3/uL (ref 1.7–7.7)
NEUTROS PCT: 60 % (ref 43–77)
Platelets: 212 10*3/uL (ref 150–400)
RBC: 4.4 MIL/uL (ref 3.87–5.11)
RDW: 13.1 % (ref 11.5–15.5)
WBC: 8.9 10*3/uL (ref 4.0–10.5)

## 2014-02-23 LAB — COMPREHENSIVE METABOLIC PANEL
ALBUMIN: 3.6 g/dL (ref 3.5–5.2)
ALT: 18 U/L (ref 0–35)
AST: 19 U/L (ref 0–37)
Alkaline Phosphatase: 104 U/L (ref 39–117)
BILIRUBIN TOTAL: 0.3 mg/dL (ref 0.3–1.2)
BUN: 22 mg/dL (ref 6–23)
CHLORIDE: 98 meq/L (ref 96–112)
CO2: 27 mEq/L (ref 19–32)
CREATININE: 0.74 mg/dL (ref 0.50–1.10)
Calcium: 9.3 mg/dL (ref 8.4–10.5)
GFR calc Af Amer: >90 mL/min (ref >90)
GFR calc non Af Amer: 79 mL/min — ABNORMAL LOW (ref >90)
Glucose, Bld: 426 mg/dL — ABNORMAL HIGH (ref 70–99)
Potassium: 4.7 mEq/L (ref 3.7–5.3)
Sodium: 137 mEq/L (ref 137–147)
TOTAL PROTEIN: 7.5 g/dL (ref 6.0–8.3)

## 2014-02-23 LAB — URINALYSIS, ROUTINE W REFLEX MICROSCOPIC
Bilirubin Urine: NEGATIVE
Hgb urine dipstick: NEGATIVE
KETONES UR: NEGATIVE mg/dL
LEUKOCYTES UA: NEGATIVE
Nitrite: NEGATIVE
PH: 5 (ref 5.0–8.0)
Protein, ur: NEGATIVE mg/dL
SPECIFIC GRAVITY, URINE: 1.011 (ref 1.005–1.030)
Urobilinogen, UA: 0.2 mg/dL (ref 0.0–1.0)

## 2014-02-23 LAB — LIPASE, BLOOD: Lipase: 22 U/L (ref 11–59)

## 2014-02-23 LAB — URINE MICROSCOPIC-ADD ON: URINE-OTHER: NONE SEEN

## 2014-02-23 LAB — CBG MONITORING, ED: GLUCOSE-CAPILLARY: 194 mg/dL — AB (ref 70–99)

## 2014-02-23 LAB — PRO B NATRIURETIC PEPTIDE: PRO B NATRI PEPTIDE: 95.7 pg/mL (ref 0–450)

## 2014-02-23 MED ORDER — IOHEXOL 300 MG/ML  SOLN
50.0000 mL | Freq: Once | INTRAMUSCULAR | Status: AC | PRN
  Administered 2014-02-23: 50 mL via ORAL

## 2014-02-23 MED ORDER — MORPHINE SULFATE 4 MG/ML IJ SOLN
4.0000 mg | Freq: Once | INTRAMUSCULAR | Status: AC
  Administered 2014-02-23: 4 mg via INTRAVENOUS
  Filled 2014-02-23: qty 1

## 2014-02-23 MED ORDER — INSULIN ASPART 100 UNIT/ML ~~LOC~~ SOLN
10.0000 [IU] | Freq: Once | SUBCUTANEOUS | Status: AC
  Administered 2014-02-23: 10 [IU] via SUBCUTANEOUS
  Filled 2014-02-23: qty 1

## 2014-02-23 MED ORDER — IOHEXOL 300 MG/ML  SOLN
100.0000 mL | Freq: Once | INTRAMUSCULAR | Status: AC | PRN
  Administered 2014-02-23: 100 mL via INTRAVENOUS

## 2014-02-23 NOTE — ED Notes (Signed)
Patient also c/o dizziness and shortness of breath.  Was started on lasix 2 weeks ago by Dr. Sarajane Jews.

## 2014-02-23 NOTE — ED Provider Notes (Signed)
CSN: [id number]     Arrival date & time 02/23/14  1414 History   First MD Initiated Contact with Patient 02/23/14 1725     Chief Complaint  Patient presents with  . Abdominal Pain     (Consider location/radiation/quality/duration/timing/severity/associated sxs/prior Treatment) The history is provided by the patient.  Melanie Mcdonald is a 80 y.o. female hx of CAD, DM, TIA here with abdominal pain, dizziness, shortness of breath. She has been having shortness of breath and dizziness and abdominal pain over the last 6 months. She saw Dr. Sarajane Jews recently and was given Lasix and shortness of breath improved. However for the last several days she had some worsening abdominal pain. She states that she feels bloated and had diffuse abdominal pain. Denies any nausea or vomiting or diarrhea. Denies any fever. Has been uncompliant with her diabetes medicine    Past Medical History  Diagnosis Date  . ANEMIA 01/30/2008  . ARTHRITIS 12/11/2007  . BACK PAIN, CHRONIC 06/23/2008  . BREAST CYST, RIGHT 12/17/2007  . CONTACT DERMATITIS 03/10/2009  . CORONARY ARTERY DISEASE 03/01/2007    had a normal Myoview stress test 07-13-11  . CYSTOCELE WITHOUT MENTION UTERINE PROLAPSE LAT 09/12/2010  . DEGENERATIVE JOINT DISEASE 08/07/2007  . DEPRESSION 03/01/2007  . DIABETES MELLITUS, TYPE II 08/26/2008  . Esophageal reflux 11/06/2007  . HYPERKERATOSIS 06/02/2009  . HYPERLIPIDEMIA 08/26/2008  . HYPERTENSION 03/01/2007  . Irritable bowel syndrome 01/26/2009  . LABYRINTHITIS 11/03/2009  . 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 OBESITY 03/01/2007  . PERIPHERAL VASCULAR DISEASE 01/26/2009  . SYNCOPE 08/26/2008    had brain MRI on 06-28-12 showing only chronic microvascular ischemia and atrophy   . TRANSIENT ISCHEMIC ATTACK 10/20/2009    had normal brain MRA with patent vertebrals and carotids 06-28-12  . NEPHROLITHIASIS 04/29/2009  . COPD (chronic obstructive pulmonary disease)    Past Surgical History  Procedure  Laterality Date  . Cataract extraction    . Abdominal hysterectomy    . Back surgery      x2  . Tonsilectomy, adenoidectomy, bilateral myringotomy and tubes    . Spine surgery      x 3  . Cardiac catheterization  07/2009  . Intraocular lens insertion    . Esophagogastroduodenoscopy  11-13-07    per Dr. Deatra Ina, normal   . Colonoscopy  11-13-07    per Dr. Deatra Ina, benign polyps, repeat in 5 yrs   Family History  Problem Relation Age of Onset  . Lupus Mother   . COPD Father    History  Substance Use Topics  . Smoking status: Never Smoker   . Smokeless tobacco: Never Used  . Alcohol Use: No   OB History   Grav Para Term Preterm Abortions TAB SAB Ect Mult Living                 Review of Systems  Gastrointestinal: Positive for abdominal pain.  All other systems reviewed and are negative.     Allergies  Aspirin; Doxycycline; Erythromycin; Penicillins; and Sulfonamide derivatives  Home Medications   Prior to Admission medications   Medication Sig Start Date End Date Taking? Authorizing Provider  diclofenac sodium (VOLTAREN) 1 % GEL Apply 4 g topically 4 (four) times daily as needed (for arthritis pain in shoulders and fingers). For joint pain. 04/07/13  Yes Laurey Morale, MD  furosemide (LASIX) 40 MG tablet Take 1 tablet (40 mg total) by mouth 2 (two) times daily. 12/10/13  Yes Laurey Morale, MD  insulin  glargine (LANTUS) 100 unit/mL SOPN Inject 100 Units into the skin at bedtime.   Yes Historical Provider, MD  meclizine (ANTIVERT) 25 MG tablet Take 25 mg by mouth every 4 (four) hours as needed for dizziness.   Yes Historical Provider, MD  nitroGLYCERIN (NITROSTAT) 0.4 MG SL tablet Place 1 tablet (0.4 mg total) under the tongue every 5 (five) minutes as needed for chest pain. 07/29/13  Yes Laurey Morale, MD  oxyCODONE-acetaminophen (PERCOCET) 10-325 MG per tablet Take 1 tablet by mouth at bedtime. 12/10/13  Yes Laurey Morale, MD  pramipexole (MIRAPEX) 1 MG tablet Take 3 mg by  mouth at bedtime.   Yes Historical Provider, MD  promethazine (PHENERGAN) 25 MG tablet Take 1 tablet (25 mg total) by mouth every 6 (six) hours as needed for nausea (nausea). For nausea. 05/22/13  Yes Laurey Morale, MD  zolpidem (AMBIEN) 10 MG tablet Take 10 mg by mouth at bedtime.   Yes Historical Provider, MD   BP 168/68  Pulse 86  Temp(Src) 98 F (36.7 C) (Oral)  Resp 18  SpO2 98% Physical Exam  Nursing note and vitals reviewed. Constitutional: She is oriented to person, place, and time.  Chronically ill, slightly uncomfortable   HENT:  Head: Normocephalic.  Mouth/Throat: Oropharynx is clear and moist.  Eyes: Conjunctivae are normal. Pupils are equal, round, and reactive to light.  Neck: Normal range of motion. Neck supple.  Cardiovascular: Normal rate, regular rhythm and normal heart sounds.   Pulmonary/Chest: Effort normal and breath sounds normal. No respiratory distress. She has no wheezes. She has no rales.  Abdominal: Soft. Bowel sounds are normal.  Overweight, mild diffuse tenderness, no rebound.   Musculoskeletal: Normal range of motion. She exhibits no edema and no tenderness.  Neurological: She is alert and oriented to person, place, and time. No cranial nerve deficit. Coordination normal.  Skin: Skin is warm and dry.  Psychiatric: She has a normal mood and affect. Her behavior is normal. Judgment and thought content normal.    ED Course  Procedures (including critical care time) Labs Review Labs Reviewed  COMPREHENSIVE METABOLIC PANEL - Abnormal; Notable for the following:    Glucose, Bld 426 (*)    GFR calc non Af Amer 79 (*)    All other components within normal limits  URINALYSIS, ROUTINE W REFLEX MICROSCOPIC - Abnormal; Notable for the following:    Glucose, UA >1000 (*)    All other components within normal limits  CBG MONITORING, ED - Abnormal; Notable for the following:    Glucose-Capillary 194 (*)    All other components within normal limits  CBC WITH  DIFFERENTIAL  LIPASE, BLOOD  URINE MICROSCOPIC-ADD ON  PRO B NATRIURETIC PEPTIDE    Imaging Review Ct Abdomen Pelvis W Contrast  02/23/2014   CLINICAL DATA:  Abdominal pain  EXAM: CT ABDOMEN AND PELVIS WITH CONTRAST  TECHNIQUE: Multidetector CT imaging of the abdomen and pelvis was performed using the standard protocol following bolus administration of intravenous contrast.  CONTRAST:  167mL OMNIPAQUE IOHEXOL 300 MG/ML  SOLN  COMPARISON:  CT abdomen 05/12/2013  FINDINGS: Mild scarring lung bases is unchanged. Extensive calcification of mitral annulus. Heart size normal.  Fatty infiltration of the liver without liver mass. Gallbladder and bile ducts are normal. Atrophic pancreas without calcification or mass or edema. The spleen is normal. The kidneys show no obstruction or mass. No renal calculi identified.  Negative for bowel obstruction or bowel thickening. Appendix not visualized. Terminal ileum is normal.  Negative for mass or adenopathy. Prior hysterectomy. No free fluid. No acute bony abnormality.  IMPRESSION: Fatty infiltration of the liver.  No acute abnormality.   Electronically Signed   By: Franchot Gallo M.D.   On: 02/23/2014 19:03   Dg Chest Portable 1 View  02/23/2014   CLINICAL DATA:  80 year old female with pain and swelling.  EXAM: PORTABLE CHEST - 1 VIEW  COMPARISON:  02/13/2014 and prior chest radiographs  FINDINGS: The cardiomediastinal silhouette is unremarkable.  Mild peribronchial thickening is noted.  There is no evidence of focal airspace disease, pulmonary edema, suspicious pulmonary nodule/mass, pleural effusion, or pneumothorax. No acute bony abnormalities are identified.  IMPRESSION: Mild peribronchial thickening without focal pneumonia.   Electronically Signed   By: Hassan Rowan M.D.   On: 02/23/2014 18:23     EKG Interpretation   Date/Time:  Monday Feb 23 2014 17:32:22 EDT Ventricular Rate:  79 PR Interval:  189 QRS Duration: 88 QT Interval:  409 QTC Calculation:  469 R Axis:     Text Interpretation:  Sinus rhythm Probable left atrial enlargement No  significant change since last tracing Confirmed by Klaus Casteneda  MD, Syriana Croslin ([id number])  on 02/23/2014 8:04:59 PM      MDM   Final diagnoses:  None    Melanie Mcdonald is a 80 y.o. female here with SOB, ab pain. SOB chronic, will check BNP. I doubt ACS or PE. Will do ct ab/pel to assess abdominal pain.   8:05 PM CT showed fatty liver. LFTs nl. Glucose was 450 on CMP. No AG. I gave her SQ insulin and went down to 195. BNP nl. I told her to take her insulin at home as prescribed.    Wandra Arthurs, MD 02/23/14 2006

## 2014-02-23 NOTE — ED Notes (Signed)
Patient is alert and oriented x3.  She was given DC instructions and follow up visit instructions.  Patient gave verbal understanding. She was DC ambulatory under her own power to home.  V/S stable.  He was not showing any signs of distress on DC 

## 2014-02-23 NOTE — ED Notes (Signed)
Pt reports abdominal pain and dizziness x6 months. Reports she has seen Dr Sarajane Jews for it, was sent to a general surgeon, but nothing was found. Pain 7/10 at present. Denies n/v/d, denies dysuria.

## 2014-02-23 NOTE — Discharge Instructions (Signed)
Continue taking your oxycodone as prescribed.   You need to use insulin as prescribed by your doctor. Check your blood sugar at least once a day.   Follow up with Dr. Sarajane Jews this week.   Return to ER if you have vomiting, severe pain.

## 2014-02-24 ENCOUNTER — Telehealth: Payer: Self-pay | Admitting: Family Medicine

## 2014-02-24 NOTE — Telephone Encounter (Signed)
Pt went to ED on Monday for severe abd pain. Advised pt fu w/ pcp. No 30 min appt availalble . pls advise when to schedule.  Pt states the lasix she is on is not getting the fluid out.

## 2014-02-24 NOTE — Telephone Encounter (Signed)
appt scheduled,

## 2014-02-24 NOTE — Telephone Encounter (Signed)
I spoke with pt & gave new directions for Lasix, per Dr. Sarajane Jews take 2 in the morning and 1 in the afternoon. Can you call pt to schedule the visit for 02/27/14 Friday and a afternoon appointment.

## 2014-02-27 ENCOUNTER — Ambulatory Visit (INDEPENDENT_AMBULATORY_CARE_PROVIDER_SITE_OTHER): Payer: Medicare Other | Admitting: Family Medicine

## 2014-02-27 ENCOUNTER — Encounter: Payer: Self-pay | Admitting: Family Medicine

## 2014-02-27 ENCOUNTER — Encounter: Payer: Self-pay | Admitting: Gastroenterology

## 2014-02-27 VITALS — BP 130/80 | HR 95 | Temp 98.7°F | Ht 68.0 in

## 2014-02-27 DIAGNOSIS — R609 Edema, unspecified: Secondary | ICD-10-CM

## 2014-02-27 DIAGNOSIS — E876 Hypokalemia: Secondary | ICD-10-CM

## 2014-02-27 DIAGNOSIS — E119 Type 2 diabetes mellitus without complications: Secondary | ICD-10-CM

## 2014-02-27 DIAGNOSIS — R1031 Right lower quadrant pain: Secondary | ICD-10-CM

## 2014-02-27 MED ORDER — FUROSEMIDE 40 MG PO TABS: 80.0000 mg | ORAL_TABLET | Freq: Two times a day (BID) | ORAL | Status: DC

## 2014-02-27 MED ORDER — INSULIN ASPART PROT & ASPART (70-30 MIX) 100 UNIT/ML PEN: 10.0000 [IU] | PEN_INJECTOR | Freq: Two times a day (BID) | SUBCUTANEOUS | Status: DC

## 2014-02-27 MED ORDER — POTASSIUM CHLORIDE ER 10 MEQ PO TBCR: 10.0000 meq | EXTENDED_RELEASE_TABLET | Freq: Every day | ORAL | Status: DC

## 2014-02-27 NOTE — Progress Notes (Signed)
Pre visit review using our clinic review tool, if applicable. No additional management support is needed unless otherwise documented below in the visit note. 

## 2014-02-27 NOTE — Progress Notes (Signed)
   Subjective:    Patient ID: Melanie Mcdonald, female    DOB: March 24, 1933, 80 y.o.   MRN: [id number]  HPI Here to follow up an ER visit on 02-23-14 for abdominal pain. All her labs, abdominal CT scan, etc were normal except for a glucose over over 400. No explanation for her pain was given. She was told to take her regular medications and was sent home. She has had this RLQ pain for over a year, but it is getting worse lately. It is sharp and can be severe. No urinery sx. Her BMs are regular. No fever. No nausea or vomiting. She saw Dr. Margot Mcdonald for this last September and he could not find a cause. He felt it was not surgical in nature but may be muscular. She is past due for another colonoscopy. We have already discussed that she has had 2 surgeries in this area in the past (oopherectomy and hyserectomy) , and I think it is quite likely that she has adhesions. Her glucose in the ER was over 400. She takes 100 units of Lantus at bedtime but nothing during the day. For the past week her glucoses stay on the 200s or 300s. She feels weak and dizzy. She is taking 80 mg of lasix bid and this has helped her breathing and her leg swelling, but she is having leg cramps.    Review of Systems  Constitutional: Positive for fatigue. Negative for fever.  Respiratory: Negative.   Cardiovascular: Positive for chest pain. Negative for palpitations and leg swelling.  Gastrointestinal: Positive for abdominal pain. Negative for nausea, vomiting, diarrhea, constipation, blood in stool, abdominal distention and rectal pain.       Objective:   Physical Exam  Constitutional: She is oriented to person, place, and time. She appears well-developed and well-nourished.  Cardiovascular: Normal rate, regular rhythm, normal heart sounds and intact distal pulses.   Pulmonary/Chest: Effort normal and breath sounds normal.  Musculoskeletal:  2+ edema to the legs   Neurological: She is alert and oriented to person, place, and time.           Assessment & Plan:  We will send her to see Dr. Deatra Ina next week for a GI evaluation for the RLQ pain. She will stay on the current regimen of Lasix but we will add Klor-con bid for the leg cramps. Stay on Lantus but we will add 10 units of Novolog 70/30 twice a day around 9 am and 5 pm to this. She will watch her glucoses closely and give [place] a report next week.

## 2014-03-03 ENCOUNTER — Encounter: Payer: Self-pay | Admitting: *Deleted

## 2014-03-05 ENCOUNTER — Ambulatory Visit (INDEPENDENT_AMBULATORY_CARE_PROVIDER_SITE_OTHER): Payer: Medicare Other | Admitting: Physician Assistant

## 2014-03-05 ENCOUNTER — Encounter: Payer: Self-pay | Admitting: Physician Assistant

## 2014-03-05 ENCOUNTER — Ambulatory Visit (INDEPENDENT_AMBULATORY_CARE_PROVIDER_SITE_OTHER): Payer: Medicare Other | Admitting: Gastroenterology

## 2014-03-05 ENCOUNTER — Encounter: Payer: Self-pay | Admitting: Gastroenterology

## 2014-03-05 VITALS — BP 108/70 | HR 90 | Ht 68.0 in | Wt 240.0 lb

## 2014-03-05 VITALS — BP 132/76 | HR 80 | Temp 98.0°F | Resp 20 | Wt 240.0 lb

## 2014-03-05 DIAGNOSIS — H8110 Benign paroxysmal vertigo, unspecified ear: Secondary | ICD-10-CM

## 2014-03-05 DIAGNOSIS — Z86010 Personal history of colonic polyps: Secondary | ICD-10-CM

## 2014-03-05 DIAGNOSIS — R109 Unspecified abdominal pain: Secondary | ICD-10-CM

## 2014-03-05 DIAGNOSIS — L299 Pruritus, unspecified: Secondary | ICD-10-CM

## 2014-03-05 NOTE — Patient Instructions (Signed)
Continue to use Hydrocortisone cream for itching symptoms as needed, AND monitor symptoms for any patterns that seem to aggravate or cause the symptoms.  Continue to use the meclizine as prescribed to help your dizziness/vertigo type symptoms.   Plan to follow up with PCP in 1 to 2 weeks to reassess. Followup sooner for worsening or persistent symptoms.   Benign Positional Vertigo Vertigo means you feel like you or your surroundings are moving when they are not. Benign positional vertigo is the most common form of vertigo. Benign means that the cause of your condition is not serious. Benign positional vertigo is more common in older adults. CAUSES  Benign positional vertigo is the result of an upset in the labyrinth system. This is an area in the middle ear that helps control your balance. This may be caused by a viral infection, head injury, or repetitive motion. However, often no specific cause is found. SYMPTOMS  Symptoms of benign positional vertigo occur when you move your head or eyes in different directions. Some of the symptoms may include:  Loss of balance and falls.  Vomiting.  Blurred vision.  Dizziness.  Nausea.  Involuntary eye movements (nystagmus). DIAGNOSIS  Benign positional vertigo is usually diagnosed by physical exam. If the specific cause of your benign positional vertigo is unknown, your caregiver may perform imaging tests, such as magnetic resonance imaging (MRI) or computed tomography (CT). TREATMENT  Your caregiver may recommend movements or procedures to correct the benign positional vertigo. Medicines such as meclizine, benzodiazepines, and medicines for nausea may be used to treat your symptoms. In rare cases, if your symptoms are caused by certain conditions that affect the inner ear, you may need surgery. HOME CARE INSTRUCTIONS   Follow your caregiver's instructions.  Move slowly. Do not make sudden body or head movements.  Avoid driving.  Avoid  operating heavy machinery.  Avoid performing any tasks that would be dangerous to you or others during a vertigo episode.  Drink enough fluids to keep your urine clear or pale yellow. SEEK IMMEDIATE MEDICAL CARE IF:   You develop problems with walking, weakness, numbness, or using your arms, hands, or legs.  You have difficulty speaking.  You develop severe headaches.  Your nausea or vomiting continues or gets worse.  You develop visual changes.  Your family or friends notice any behavioral changes.  Your condition gets worse.  You have a fever.  You develop a stiff neck or sensitivity to light. MAKE SURE YOU:   Understand these instructions.  Will watch your condition.  Will get help right away if you are not doing well or get worse. Document Released: 06/26/2006 Document Revised: 12/11/2011 Document Reviewed: 06/08/2011 [facility] Patient Information 2014 [place].

## 2014-03-05 NOTE — Progress Notes (Signed)
Pre visit review using our clinic review tool, if applicable. No additional management support is needed unless otherwise documented below in the visit note. 

## 2014-03-05 NOTE — Progress Notes (Signed)
Subjective:    Patient ID: Melanie Mcdonald, female    DOB: 01-21-1933, 80 y.o.   MRN: [id number]  Allergic Reaction This is a new problem. The current episode started 3 to 5 days ago. The problem occurs constantly. The problem has been waxing and waning (rahses/itching come and go.) since onset. Associated with: new insulin. Novolog 30/70. The time of exposure was just prior to onset. Associated symptoms include itching and a rash. Pertinent negatives include no abdominal pain, chest pain, chest pressure, coughing, diarrhea, difficulty breathing, drooling, eye itching, eye redness, eye watering, globus sensation, hyperventilation, stridor, trouble swallowing, vomiting or wheezing. Swelling location: bilat LE swelling due to CHF. Past treatments include topical corticosteroid (hydrocortisone cream). The treatment provided significant (relieved it.) relief. Her past medical history is significant for medication allergies (aspirin, PCN, sulfonamides, doxy, erythromycin) and seasonal allergies. There is no history of asthma, atopic dermatitis or food allergies.      Review of Systems  Constitutional: Negative for fever and chills.  HENT: Positive for postnasal drip. Negative for drooling, ear discharge, ear pain, rhinorrhea and trouble swallowing.   Eyes: Negative for redness and itching.  Respiratory: Negative for cough, shortness of breath, wheezing and stridor.   Cardiovascular: Negative for chest pain.  Gastrointestinal: Positive for nausea. Negative for vomiting, abdominal pain and diarrhea.  Skin: Positive for itching and rash.  Allergic/Immunologic: Negative for food allergies.  Neurological: Positive for dizziness, light-headedness and headaches.  All other systems reviewed and are negative.     Past Medical History  Diagnosis Date  . ANEMIA 01/30/2008  . ARTHRITIS 12/11/2007  . BACK PAIN, CHRONIC 06/23/2008  . BREAST CYST, RIGHT 12/17/2007  . CONTACT DERMATITIS 03/10/2009  . CORONARY  ARTERY DISEASE 03/01/2007    had a normal Myoview stress test 07-13-11  . CYSTOCELE WITHOUT MENTION UTERINE PROLAPSE LAT 09/12/2010  . DEGENERATIVE JOINT DISEASE 08/07/2007  . DEPRESSION 03/01/2007  . DIABETES MELLITUS, TYPE II 08/26/2008  . Esophageal reflux 11/06/2007  . HYPERKERATOSIS 06/02/2009  . HYPERLIPIDEMIA 08/26/2008  . HYPERTENSION 03/01/2007  . Irritable bowel syndrome 01/26/2009  . LABYRINTHITIS 11/03/2009  . 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 OBESITY 03/01/2007  . PERIPHERAL VASCULAR DISEASE 01/26/2009  . SYNCOPE 08/26/2008    had brain MRI on 06-28-12 showing only chronic microvascular ischemia and atrophy   . TRANSIENT ISCHEMIC ATTACK 10/20/2009    had normal brain MRA with patent vertebrals and carotids 06-28-12  . NEPHROLITHIASIS 04/29/2009  . COPD (chronic obstructive pulmonary disease)   . Colon polyps     FRAGMENTS OF HYPERPLASTIC POLYP  . Diverticulosis of colon (without mention of hemorrhage)    Past Surgical History  Procedure Laterality Date  . Cataract extraction    . Abdominal hysterectomy    . Back surgery      x2  . Tonsilectomy, adenoidectomy, bilateral myringotomy and tubes    . Spine surgery      x 3  . Cardiac catheterization  07/2009  . Intraocular lens insertion    . Esophagogastroduodenoscopy  11-13-07    per Dr. Deatra Ina, normal   . Colonoscopy  11-13-07    per Dr. Deatra Ina, benign polyps, repeat in 5 yrs    reports that she has never smoked. She has never used smokeless tobacco. She reports that she does not drink alcohol or use illicit drugs. family history includes COPD in her father; Lupus in her mother. Allergies  Allergen Reactions  . Aspirin Other (See Comments)    Abdominal pain  .  Doxycycline Itching  . Erythromycin Nausea And Vomiting  . Penicillins Swelling  . Sulfonamide Derivatives Nausea And Vomiting       Objective:   Physical Exam  Nursing note and vitals reviewed. Constitutional: She is oriented to person, place,  and time. She appears well-developed and well-nourished. No distress.  HENT:  Head: Normocephalic and atraumatic.  Right Ear: External ear normal.  Left Ear: External ear normal.  Nose: Nose normal.  Mouth/Throat: No oropharyngeal exudate.  Oropharynx mildly erythematous with cobblestoning, no exudate. Bilat TMs normal. Bilat Frontal and Max sinuses non-TTP.  Eyes: Conjunctivae and EOM are normal. Pupils are equal, round, and reactive to light.  Neck: Normal range of motion. Neck supple. No JVD present.  Cardiovascular: Normal rate, regular rhythm, normal heart sounds and intact distal pulses.  Exam reveals no gallop and no friction rub.   No murmur heard. Pulmonary/Chest: Effort normal and breath sounds normal. No stridor. No respiratory distress. She has no wheezes. She has no rales. She exhibits no tenderness.  Musculoskeletal: Normal range of motion. She exhibits edema (1+ bilat LE edema).  Lymphadenopathy:    She has no cervical adenopathy.  Neurological: She is alert and oriented to person, place, and time. She has normal reflexes. She displays normal reflexes. No cranial nerve deficit. She exhibits normal muscle tone. Coordination normal.  Dizziness with position change, pt states feeling of "falling down" or "to the side". No visible Nystagmus. Gait normal.  Skin: Skin is warm and dry. No rash noted. She is not diaphoretic. No erythema. No pallor.  Psychiatric: She has a normal mood and affect. Her behavior is normal. Judgment and thought content normal.   Filed Vitals:   03/05/14 1330  BP: 132/76  Pulse: 80  Temp: 98 F (36.7 C)  Resp: 20    Lab Results  Component Value Date   WBC 8.9 02/23/2014   HGB 13.0 02/23/2014   HCT 38.9 02/23/2014   PLT 212 02/23/2014   GLUCOSE 426* 02/23/2014   CHOL 134 01/15/2012   TRIG 83.0 01/15/2012   HDL 60.30 01/15/2012   LDLCALC 57 01/15/2012   ALT 18 02/23/2014   AST 19 02/23/2014   NA 137 02/23/2014   K 4.7 02/23/2014   CL 98 02/23/2014     CREATININE 0.74 02/23/2014   BUN 22 02/23/2014   CO2 27 02/23/2014   TSH 0.71 12/10/2013   INR 1.02 07/22/2009   HGBA1C 9.7* 12/10/2013         Assessment & Plan:  Melanie Mcdonald was seen today for allergic reaction and dizziness and nausea.  Diagnoses and associated orders for this visit:  BPPV (benign paroxysmal positional vertigo) Comments: Pt already has meclizine prescription. Plan to continue this. WIll monitor symptoms.  Itching Comments: Resolved? with hydrocortisone cream. Continue use as needed. Pt will monitor for pattern/worsening symptoms.    Pt will monitor symptoms at home for causative/aggravating agents.  Plan to follow up in about 1 to 2 weeks with PCP to reassess, or sooner if symptoms worsen or persist despite treatment.  Patient Instructions  Continue to use Hydrocortisone cream for itching symptoms as needed, AND monitor symptoms for any patterns that seem to aggravate or cause the symptoms.  Continue to use the meclizine as prescribed to help your dizziness/vertigo type symptoms.   Plan to follow up with PCP in 1 to 2 weeks to reassess. Followup sooner for worsening or persistent symptoms.

## 2014-03-05 NOTE — Patient Instructions (Signed)
It has been recommended to you by your physician that you have a(n) Colonoscopy completed. Per your request, we did not schedule the procedure(s) today. Please contact our office at [phone] should you decide to have the procedure completed.   Please make a follow up appointment with [facility]

## 2014-03-09 ENCOUNTER — Encounter: Payer: Self-pay | Admitting: Gastroenterology

## 2014-03-09 DIAGNOSIS — R109 Unspecified abdominal pain: Secondary | ICD-10-CM

## 2014-03-09 DIAGNOSIS — Z860100 Personal history of colon polyps, unspecified: Secondary | ICD-10-CM

## 2014-03-09 HISTORY — DX: Unspecified abdominal pain: R10.9

## 2014-03-09 HISTORY — DX: Personal history of colon polyps, unspecified: Z86.0100

## 2014-03-09 HISTORY — DX: Personal history of colonic polyps: Z86.010

## 2014-03-09 NOTE — Progress Notes (Signed)
03/09/2014 Melanie Mcdonald [id number] 1933/01/18   HISTORY OF PRESENT ILLNESS:  This is a 80 year old female with multiple medical problems as listed below. She is known to Dr. Deatra Ina for previous EGD and colonoscopy in February 2009. At that time her EGD was normal. Colonoscopy revealed diverticulosis and some polyps, which were removed, some of which were adenomas. It was recommended that she have a repeat colonoscopy in 5 years from that time.  She presents to our office today with complaints of chronic abdominal pain. She tells me that this same pain has been present for 10 years or more.  The pain is located in her lower abdomen. She's had multiple surgeries on her abdomen including appendectomy, oophorectomy, and then hysterectomy at age 71 years old. She tells me that she recalls being told that she had several adhesions/scar tissue in the past. She was seen by Dr. Margot Chimes with [facility] for the same issue in September 2014 at which time he thought the pain may be musculoskeletal, but he also recommended that she proceed with the recommended colonoscopy.  She had a CT scan of the abdomen and pelvis with contrast on May 25, which revealed only fatty liver.  Lipase, CBC, and CMP were unremarkable except for significantly elevated blood sugar at that time as well.   Past Medical History  Diagnosis Date  . ANEMIA 01/30/2008  . ARTHRITIS 12/11/2007  . BACK PAIN, CHRONIC 06/23/2008  . BREAST CYST, RIGHT 12/17/2007  . CONTACT DERMATITIS 03/10/2009  . CORONARY ARTERY DISEASE 03/01/2007    had a normal Myoview stress test 07-13-11  . CYSTOCELE WITHOUT MENTION UTERINE PROLAPSE LAT 09/12/2010  . DEGENERATIVE JOINT DISEASE 08/07/2007  . DEPRESSION 03/01/2007  . DIABETES MELLITUS, TYPE II 08/26/2008  . Esophageal reflux 11/06/2007  . HYPERKERATOSIS 06/02/2009  . HYPERLIPIDEMIA 08/26/2008  . HYPERTENSION 03/01/2007  . Irritable bowel syndrome 01/26/2009  . LABYRINTHITIS 11/03/2009  . Narcolepsy without  cataplexy(347.00) 08/26/2008  . OBESITY 03/01/2007  . PERIPHERAL VASCULAR DISEASE 01/26/2009  . SYNCOPE 08/26/2008    had brain MRI on 06-28-12 showing only chronic microvascular ischemia and atrophy   . TRANSIENT ISCHEMIC ATTACK 10/20/2009    had normal brain MRA with patent vertebrals and carotids 06-28-12  . NEPHROLITHIASIS 04/29/2009  . COPD (chronic obstructive pulmonary disease)   . Colon polyps     FRAGMENTS OF HYPERPLASTIC POLYP  . Diverticulosis of colon (without mention of hemorrhage)    Past Surgical History  Procedure Laterality Date  . Cataract extraction    . Abdominal hysterectomy    . Back surgery      x2  . Tonsilectomy, adenoidectomy, bilateral myringotomy and tubes    . Spine surgery      x 3  . Cardiac catheterization  07/2009  . Intraocular lens insertion    . Esophagogastroduodenoscopy  11-13-07    per Dr. Deatra Ina, normal   . Colonoscopy  11-13-07    per Dr. Deatra Ina, benign polyps, repeat in 5 yrs    reports that she has never smoked. She has never used smokeless tobacco. She reports that she does not drink alcohol or use illicit drugs. family history includes COPD in her father; Lupus in her mother. Allergies  Allergen Reactions  . Aspirin Other (See Comments)    Abdominal pain  . Doxycycline Itching  . Erythromycin Nausea And Vomiting  . Penicillins Swelling  . Sulfonamide Derivatives Nausea And Vomiting      Outpatient Encounter Prescriptions as of 03/05/2014  Medication Sig  .  diclofenac sodium (VOLTAREN) 1 % GEL Apply 4 g topically 4 (four) times daily as needed (for arthritis pain in shoulders and fingers). For joint pain.  . furosemide (LASIX) 40 MG tablet Take 2 tablets (80 mg total) by mouth 2 (two) times daily. Take 2 in the AM & 2 in the PM  . Insulin Aspart Prot & Aspart (NOVOLOG MIX 70/30 FLEXPEN) (70-30) 100 UNIT/ML Pen Inject 10 Units into the skin 2 (two) times daily.  . insulin glargine (LANTUS) 100 unit/mL SOPN Inject 100 Units into the skin  at bedtime.  . meclizine (ANTIVERT) 25 MG tablet Take 25 mg by mouth every 4 (four) hours as needed for dizziness.  . nitroGLYCERIN (NITROSTAT) 0.4 MG SL tablet Place 1 tablet (0.4 mg total) under the tongue every 5 (five) minutes as needed for chest pain.  Marland Kitchen oxyCODONE-acetaminophen (PERCOCET) 10-325 MG per tablet Take 1 tablet by mouth at bedtime.  . potassium chloride (KLOR-CON 10) 10 MEQ tablet Take 1 tablet (10 mEq total) by mouth daily.  . pramipexole (MIRAPEX) 1 MG tablet Take 3 mg by mouth at bedtime.  . promethazine (PHENERGAN) 25 MG tablet Take 1 tablet (25 mg total) by mouth every 6 (six) hours as needed for nausea (nausea). For nausea.  Marland Kitchen zolpidem (AMBIEN) 10 MG tablet Take 10 mg by mouth at bedtime.     REVIEW OF SYSTEMS  : All other systems reviewed and negative except where noted in the History of Present Illness.   PHYSICAL EXAM: BP 108/70  Pulse 90  Ht 5\' 8"  (1.727 m)  Wt 240 lb (108.863 kg)  BMI 36.50 kg/m2 General: Well developed white female in no acute distress, but appears uncomfortable Head: Normocephalic and atraumatic Eyes:  Sclerae anicteric, conjunctiva pink. Ears: Normal auditory acuity Lungs: Clear throughout to auscultation Heart: Regular rate and rhythm Abdomen: Soft, non-distended.  Normal bowel sounds.  Lower abdominal TTP without R/R/G. Musculoskeletal: Symmetrical with no gross deformities  Skin: No lesions on visible extremities Extremities: No edema  Neurological: Alert oriented x 4, grossly non-focal Psychological:  Alert and cooperative. Normal mood and affect  ASSESSMENT AND PLAN: -Personal history of colon polyps:  Recommended repeat colonoscopy was for 11/2012.   -Abdominal pain, chronic for 10 years or more:  ? Source.  ? Adhesions.  *I have recommended that we schedule her for her colonoscopy for both surveillance and also for evaluation of her abdominal pain.  She has history of several surgeries and history of adhesions/scar tissue as  well according to her report.  I have also asked her to follow-up with Dr. Margot Chimes with [facility] for re-evaluation.

## 2014-03-11 NOTE — Progress Notes (Signed)
Reviewed and agree with management. Robert D. Kaplan, M.D., FACG  

## 2014-03-17 ENCOUNTER — Other Ambulatory Visit: Payer: Self-pay | Admitting: Family Medicine

## 2014-03-17 ENCOUNTER — Telehealth: Payer: Self-pay | Admitting: Family Medicine

## 2014-03-17 NOTE — Telephone Encounter (Signed)
Pt needs refill on generic lasix 40 mg#120. Pt takes 4 pills a day call into [facility]

## 2014-03-17 NOTE — Telephone Encounter (Signed)
Sent to the pharmacy by e-scribe. 

## 2014-03-18 NOTE — Telephone Encounter (Signed)
I spoke with pt and she has appointment here with Dr. Sarajane Jews on 03/19/14, she will confirm the dose of Lasix.

## 2014-03-19 ENCOUNTER — Encounter: Payer: Self-pay | Admitting: Family Medicine

## 2014-03-19 ENCOUNTER — Ambulatory Visit (INDEPENDENT_AMBULATORY_CARE_PROVIDER_SITE_OTHER): Payer: Medicare Other | Admitting: Family Medicine

## 2014-03-19 ENCOUNTER — Telehealth: Payer: Self-pay | Admitting: Family Medicine

## 2014-03-19 ENCOUNTER — Ambulatory Visit: Payer: Medicare Other | Admitting: Family Medicine

## 2014-03-19 VITALS — BP 138/80 | HR 80 | Temp 98.0°F

## 2014-03-19 DIAGNOSIS — E119 Type 2 diabetes mellitus without complications: Secondary | ICD-10-CM

## 2014-03-19 DIAGNOSIS — I10 Essential (primary) hypertension: Secondary | ICD-10-CM

## 2014-03-19 DIAGNOSIS — R51 Headache: Secondary | ICD-10-CM

## 2014-03-19 MED ORDER — KETOCONAZOLE 2 % EX CREA: 1.0000 "application " | TOPICAL_CREAM | Freq: Two times a day (BID) | CUTANEOUS | Status: DC | PRN

## 2014-03-19 MED ORDER — BUPROPION HCL ER (XL) 150 MG PO TB24: 150.0000 mg | ORAL_TABLET | Freq: Every day | ORAL | Status: DC

## 2014-03-19 MED ORDER — KETOROLAC TROMETHAMINE 60 MG/2ML IM SOLN
60.0000 mg | Freq: Once | INTRAMUSCULAR | Status: AC
Start: 2014-03-19 — End: 2014-03-19
  Administered 2014-03-19: 60 mg via INTRAMUSCULAR

## 2014-03-19 NOTE — Addendum Note (Signed)
Addended by: Aggie Hacker A on: 03/19/2014 11:23 AM   Modules accepted: Orders

## 2014-03-19 NOTE — Progress Notes (Signed)
   Subjective:    Patient ID: Melanie Mcdonald, female    DOB: Feb 09, 1933, 80 y.o.   MRN: [id number]  HPI Here for a headache that started this am after she woke up. She has a long hx of tensions headaches, and this one is similar to these. No neurologic deficits. She tried a Ambulance person with some relief. Her friend drove her here. Several weeks ago we started her on mealtime Novolog in addition to her Lantus. She has not been checking her glucoses because she ran out of test strips. She saw GI and they are setting her up for a colonoscopy soon.    Review of Systems  Constitutional: Negative.   Respiratory: Negative.   Cardiovascular: Negative.   Neurological: Positive for dizziness and headaches.       Objective:   Physical Exam  Constitutional: She is oriented to person, place, and time. She appears well-developed and well-nourished.  HENT:  Head: Normocephalic and atraumatic.  Right Ear: External ear normal.  Left Ear: External ear normal.  Nose: Nose normal.  Mouth/Throat: Oropharynx is clear and moist.  Eyes: Conjunctivae are normal. Pupils are equal, round, and reactive to light.  Cardiovascular: Normal rate, regular rhythm, normal heart sounds and intact distal pulses.   Pulmonary/Chest: Effort normal and breath sounds normal.  Neurological: She is alert and oriented to person, place, and time. No cranial nerve deficit.          Assessment & Plan:  Given a shot of Toradol for the headache. Wrote for her to get more test strips so she can closely monitor her glucoses.

## 2014-03-19 NOTE — Telephone Encounter (Signed)
Pt said Dr Sarajane Jews was going to talk to her about her eye she want a call back

## 2014-03-19 NOTE — Telephone Encounter (Signed)
I spoke with pt and she has eye appointment already scheduled.

## 2014-03-19 NOTE — Progress Notes (Signed)
Pre visit review using our clinic review tool, if applicable. No additional management support is needed unless otherwise documented below in the visit note. 

## 2014-04-01 ENCOUNTER — Ambulatory Visit (INDEPENDENT_AMBULATORY_CARE_PROVIDER_SITE_OTHER): Payer: Medicare Other | Admitting: Cardiology

## 2014-04-01 ENCOUNTER — Ambulatory Visit (INDEPENDENT_AMBULATORY_CARE_PROVIDER_SITE_OTHER)
Admission: RE | Admit: 2014-04-01 | Discharge: 2014-04-01 | Disposition: A | Discharge status: Home / Self Care | Payer: Medicare Other | Source: Ambulatory Visit | Attending: Cardiology | Admitting: Cardiology

## 2014-04-01 ENCOUNTER — Encounter: Payer: Self-pay | Admitting: Cardiology

## 2014-04-01 VITALS — BP 149/81 | HR 86 | Ht 68.0 in | Wt 241.0 lb

## 2014-04-01 DIAGNOSIS — R0602 Shortness of breath: Secondary | ICD-10-CM

## 2014-04-01 MED ORDER — ALBUTEROL SULFATE HFA 108 (90 BASE) MCG/ACT IN AERS: 2.0000 | INHALATION_SPRAY | RESPIRATORY_TRACT | Status: DC | PRN

## 2014-04-01 MED ORDER — METOLAZONE 2.5 MG PO TABS: 2.5000 mg | ORAL_TABLET | Freq: Every day | ORAL | Status: DC

## 2014-04-01 MED ORDER — FLUTICASONE-SALMETEROL 250-50 MCG/DOSE IN AEPB: 1.0000 | INHALATION_SPRAY | Freq: Two times a day (BID) | RESPIRATORY_TRACT | Status: DC

## 2014-04-01 MED ORDER — IOHEXOL 350 MG/ML SOLN
80.0000 mL | Freq: Once | INTRAVENOUS | Status: AC | PRN
  Administered 2014-04-01: 80 mL via INTRAVENOUS

## 2014-04-01 NOTE — Patient Instructions (Addendum)
Your physician has recommended you make the following change in your medication:   START TAKING ADVAIR 250-50 TWICE DAILY  START TAKING METOLAZONE 2.5 MG DAILY  TAKE ALBUTEROL INHALER EVERY 4 HOURS AS NEEDED FOR SOB AND WHEEZING  Your physician has requested that you have a lexiscan myoview. For further information please visit [email or web address]. Please follow instruction sheet, as given.   Your physician has recommended that you have a pulmonary function test. Pulmonary Function Tests are a group of tests that measure how well air moves in and out of your lungs.  Non-Cardiac CT Angiography (CTA) OF THE CHEST, is a special type of CT scan that uses a computer to produce multi-dimensional views of major blood vessels throughout the body. In CT angiography, a contrast material is injected through an IV to help visualize the blood vessels

## 2014-04-01 NOTE — Progress Notes (Signed)
Patient ID: LIAT MAYOL, female   DOB: [birth year], 80 y.o.   MRN: [id number]    Patient Name: Melanie Mcdonald Date of Encounter: 04/01/2014  Primary Care Provider:  Laurey Morale, MD Primary Cardiologist:  Dorothy Spark  Problem List   Past Medical History  Diagnosis Date  . ANEMIA 01/30/2008  . ARTHRITIS 12/11/2007  . BACK PAIN, CHRONIC 06/23/2008  . BREAST CYST, RIGHT 12/17/2007  . CONTACT DERMATITIS 03/10/2009  . CORONARY ARTERY DISEASE 03/01/2007    had a normal Myoview stress test 07-13-11  . CYSTOCELE WITHOUT MENTION UTERINE PROLAPSE LAT 09/12/2010  . DEGENERATIVE JOINT DISEASE 08/07/2007  . DEPRESSION 03/01/2007  . DIABETES MELLITUS, TYPE II 08/26/2008  . Esophageal reflux 11/06/2007  . HYPERKERATOSIS 06/02/2009  . HYPERLIPIDEMIA 08/26/2008  . HYPERTENSION 03/01/2007  . Irritable bowel syndrome 01/26/2009  . LABYRINTHITIS 11/03/2009  . Narcolepsy without cataplexy 08/26/2008  . OBESITY 03/01/2007  . PERIPHERAL VASCULAR DISEASE 01/26/2009  . SYNCOPE 08/26/2008    had brain MRI on 06-28-12 showing only chronic microvascular ischemia and atrophy   . TRANSIENT ISCHEMIC ATTACK 10/20/2009    had normal brain MRA with patent vertebrals and carotids 06-28-12  . NEPHROLITHIASIS 04/29/2009  . COPD (chronic obstructive pulmonary disease)   . Colon polyps     FRAGMENTS OF HYPERPLASTIC POLYP  . Diverticulosis of colon (without mention of hemorrhage)    Past Surgical History  Procedure Laterality Date  . Cataract extraction    . Abdominal hysterectomy    . Back surgery      x2  . Tonsilectomy, adenoidectomy, bilateral myringotomy and tubes    . Spine surgery      x 3  . Cardiac catheterization  07/2009  . Intraocular lens insertion    . Esophagogastroduodenoscopy  11-13-07    per Dr. Deatra Ina, normal   . Colonoscopy  11-13-07    per Dr. Deatra Ina, benign polyps, repeat in 5 yrs   Allergies  Allergies  Allergen Reactions  . Aspirin Other (See Comments)    Abdominal pain  . Doxycycline  Itching  . Erythromycin Nausea And Vomiting  . Penicillins Swelling  . Sulfonamide Derivatives Nausea And Vomiting   HPI  80 year old female who appears to be significantly fatigued and depressed, with prior medical history of hypertension, hyperlipidemia, obesity, narcolepsy, multiple TIAs, prior cardiac catheterization with no intervention and negative stress test in 2012. Patient states that she has been profoundly fatigued and short of breath. 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 Patient appears very tired and in fact falls asleep twice during conversation and appears significantly short of breath just walking a few steps. She hasn't had pulmonary function test or chest CT. She underwent echocardiographic in March of this year that showed preserved left ventricular ejection fraction, no comment about diastolic function, mild mitral stenosis and no comment about right-sided pressures. She states that she always feels dizzy and she has been worked up for bradycardia in the past during surgery day heart monitor but is not sure what was decided based on the results. There is somedate and that her heart rate went as low as 40s and there was consideration about placement of permanent pacemaker. The patient states that she since she last saw Dr. Gwenlyn Found she has had stable daily dizziness but no syncope. She doesn't want to follow with Dr.  Gwenlyn Found. The patient states that she has a history of asthma but she's not using any inhalers.  Home Medications  Prior to Admission medications   Medication Sig Start Date End Date Taking? Authorizing Provider  buPROPion (WELLBUTRIN XL) 150 MG 24 hr tablet Take 1 tablet (150 mg total) by mouth daily. 03/19/14   Laurey Morale, MD  diclofenac sodium (VOLTAREN) 1 % GEL Apply 4 g topically 4 (four) times daily  as needed (for arthritis pain in shoulders and fingers). For joint pain. 04/07/13   Laurey Morale, MD  furosemide (LASIX) 40 MG tablet TAKE 1 TABLET BY MOUTH TWICE DAILY 03/17/14   Laurey Morale, MD  Insulin Aspart Prot & Aspart (NOVOLOG MIX 70/30 FLEXPEN) (70-30) 100 UNIT/ML Pen Inject 10 Units into the skin 2 (two) times daily. 02/27/14   Laurey Morale, MD  insulin glargine (LANTUS) 100 unit/mL SOPN Inject 100 Units into the skin at bedtime.    Historical Provider, MD  ketoconazole (NIZORAL) 2 % cream Apply 1 application topically 2 (two) times daily as needed for irritation. 03/19/14   Laurey Morale, MD  meclizine (ANTIVERT) 25 MG tablet Take 25 mg by mouth every 4 (four) hours as needed for dizziness.    Historical Provider, MD  nitroGLYCERIN (NITROSTAT) 0.4 MG SL tablet Place 1 tablet (0.4 mg total) under the tongue every 5 (five) minutes as needed for chest pain. 07/29/13   Laurey Morale, MD  oxyCODONE-acetaminophen (PERCOCET) 10-325 MG per tablet Take 1 tablet by mouth at bedtime. 12/10/13   Laurey Morale, MD  potassium chloride (KLOR-CON 10) 10 MEQ tablet Take 1 tablet (10 mEq total) by mouth daily. 02/27/14   Laurey Morale, MD  pramipexole (MIRAPEX) 1 MG tablet Take 3 mg by mouth at bedtime.    Historical Provider, MD  promethazine (PHENERGAN) 25 MG tablet Take 1 tablet (25 mg total) by mouth every 6 (six) hours as needed for nausea (nausea). For nausea. 05/22/13   Laurey Morale, MD  zolpidem (AMBIEN) 10 MG tablet Take 10 mg by mouth at bedtime.    Historical Provider, MD    Family History  Family History  Problem Relation Age of Onset  . Lupus Mother   . COPD Father    Social History  History   Social History  . Marital Status: Widowed    Spouse Name: N/A    Number of Children: N/A  . Years of Education: N/A   Occupational History  . Not on file.   Social History Main Topics  . Smoking status: Never Smoker   . Smokeless tobacco: Never Used  . Alcohol Use: No  . Drug Use: No    . Sexual Activity: No   Other Topics Concern  . Not on file   Social History Narrative  . No narrative on file    Review of Systems, as per HPI, otherwise negative General:  No chills, fever, night sweats or weight changes.  Cardiovascular:  No chest pain, dyspnea on exertion, edema, orthopnea, palpitations, paroxysmal nocturnal dyspnea. Dermatological: No rash, lesions/masses Respiratory: No cough, dyspnea Urologic: No hematuria, dysuria Abdominal:   No nausea, vomiting, diarrhea, bright red blood per rectum, melena, or hematemesis Neurologic:  No visual changes, wkns, changes in mental status. All other systems reviewed and are otherwise negative except as noted above.  Physical Exam  Height 5\' 8"  (1.727 m).  General: Pleasant, NAD Psych: Normal affect. Neuro: Alert and oriented X 3. Moves all extremities spontaneously.  HEENT: Normal  Neck: Supple without bruits or JVD. Lungs:  Resp regular and unlabored, CTA. Heart: RRR no s3, s4, or murmurs. Abdomen: Soft, non-tender, non-distended, BS + x 4.  Extremities: No clubbing, cyanosis or edema. DP/PT/Radials 2+ and equal bilaterally.  Labs:  No results found for this basename: CKTOTAL, CKMB, TROPONINI,  in the last 72 hours Lab Results  Component Value Date   WBC 8.9 02/23/2014   HGB 13.0 02/23/2014   HCT 38.9 02/23/2014   MCV 88.4 02/23/2014   PLT 212 02/23/2014    Lab Results  Component Value Date   DDIMER 0.50* 02/13/2014   No components found with this basename: POCBNP,     Component Value Date/Time   NA 137 02/23/2014 1434   K 4.7 02/23/2014 1434   CL 98 02/23/2014 1434   CO2 27 02/23/2014 1434   GLUCOSE 426* 02/23/2014 1434   BUN 22 02/23/2014 1434   CREATININE 0.74 02/23/2014 1434   CALCIUM 9.3 02/23/2014 1434   PROT 7.5 02/23/2014 1434   ALBUMIN 3.6 02/23/2014 1434   AST 19 02/23/2014 1434   ALT 18 02/23/2014 1434   ALKPHOS 104 02/23/2014 1434   BILITOT 0.3 02/23/2014 1434   GFRNONAA 79* 02/23/2014 1434   GFRAA >90  02/23/2014 1434   Lab Results  Component Value Date   CHOL 134 01/15/2012   HDL 60.30 01/15/2012   LDLCALC 57 01/15/2012   TRIG 83.0 01/15/2012   Accessory Clinical Findings  Echocardiogram - 12/16/2013  - Left ventricle: The cavity size was normal. Wall thickness was increased in a pattern of severe LVH. Systolic function was normal. The estimated ejection fraction was in the range of 55% to 60%. - Mitral valve: Calcified annulus. Moderately calcified leaflets . The findings are consistent with mild stenosis. - Left atrium: The atrium was moderately dilated. - Atrial septum: No defect or patent foramen ovale was identified.  ECG - sinus rhythm    Assessment & Plan  80 year old female with poly morbidities  1. Severe SOB -  - we will order Lexiscan nuclear stress test to rule out ischemia - CT/CTA chest to assess for parenchymal lung disease and chronic thromboembolic disease - start Advair 250/50 BID and albuterol PRN - Pulmonary function test  2. Dizziness, headaches, paresthesias - carotid US  3. Bradycardia - repeat 24-hour Holter monitor  Follow up in 2 months  Dorothy Spark, MD, [facility] 04/01/2014, 7:46 AM

## 2014-04-02 ENCOUNTER — Telehealth: Payer: Self-pay | Admitting: *Deleted

## 2014-04-02 DIAGNOSIS — E041 Nontoxic single thyroid nodule: Secondary | ICD-10-CM

## 2014-04-02 NOTE — Telephone Encounter (Signed)
lmtcb

## 2014-04-02 NOTE — Telephone Encounter (Signed)
Pt made aware of CT of the chest on 7/1 results showing no significant finding in the chest, but Dr Meda Coffee did note a thyroid nodule.  Informed pt that per Dr Meda Coffee she should have a thyroid ultrasound and referral to an endocrinologist to further eval this.  Informed pt that someone from our scheduling department will be contacting her to have these appts arranged.  Pt verbalized understanding and agrees with this plan.  Will send [facility] a message to arrange this.

## 2014-04-02 NOTE — Telephone Encounter (Signed)
Message copied by Nuala Alpha on Thu Apr 02, 2014 11:09 AM ------      Message from: Melanie Mcdonald      Created: Wed Apr 01, 2014  5:25 PM       This patient has no significant finding on her chest CT but finding of thyroid nodule. Please schedule dedicated thyroid ultrasound and a referral to endocrinology. ------

## 2014-04-02 NOTE — Telephone Encounter (Signed)
Message copied by Nuala Alpha on Thu Apr 02, 2014 11:28 AM ------      Message from: Dorothy Spark      Created: Wed Apr 01, 2014  5:25 PM       This patient has no significant finding on her chest CT but finding of thyroid nodule. Please schedule dedicated thyroid ultrasound and a referral to endocrinology. ------

## 2014-04-02 NOTE — Telephone Encounter (Signed)
LMTCB concerning chest ct results and needing to have a thyroid ultrasound done as well as a referral to endocrinology.

## 2014-04-06 ENCOUNTER — Encounter: Payer: Self-pay | Admitting: Cardiology

## 2014-04-07 ENCOUNTER — Ambulatory Visit (HOSPITAL_BASED_OUTPATIENT_CLINIC_OR_DEPARTMENT_OTHER): Payer: Medicare Other | Admitting: Radiology

## 2014-04-07 ENCOUNTER — Telehealth: Payer: Self-pay | Admitting: Cardiology

## 2014-04-07 VITALS — BP 125/47 | HR 82 | Ht 68.0 in | Wt 237.0 lb

## 2014-04-07 DIAGNOSIS — I2510 Atherosclerotic heart disease of native coronary artery without angina pectoris: Secondary | ICD-10-CM

## 2014-04-07 DIAGNOSIS — R110 Nausea: Secondary | ICD-10-CM

## 2014-04-07 DIAGNOSIS — R0602 Shortness of breath: Secondary | ICD-10-CM

## 2014-04-07 MED ORDER — REGADENOSON 0.4 MG/5ML IV SOLN
0.4000 mg | Freq: Once | INTRAVENOUS | Status: AC
  Administered 2014-04-07: 0.4 mg via INTRAVENOUS

## 2014-04-07 MED ORDER — TECHNETIUM TC 99M SESTAMIBI GENERIC - CARDIOLITE
30.0000 | Freq: Once | INTRAVENOUS | Status: AC | PRN
  Administered 2014-04-07: 30 via INTRAVENOUS

## 2014-04-07 MED ORDER — AMINOPHYLLINE 25 MG/ML IV SOLN
75.0000 mg | Freq: Two times a day (BID) | INTRAVENOUS | Status: DC | PRN
  Administered 2014-04-07: 75 mg via INTRAVENOUS

## 2014-04-07 NOTE — Telephone Encounter (Signed)
LMTCB As already informed to pt, the endocrinologist office is aware of the referral and will be contacting her very soon to get an appt scheduled, as we are cardiology only.  Pt has already been scheduled for a thyroid ultrasound for 7/8 and [facility] will call her for soon for the referral appt.  Will advise this to pt when she calls back.

## 2014-04-07 NOTE — Telephone Encounter (Signed)
New message      Pt has a growth on her throat and needs to see an endiocrinologist.  Did you make her an appt with the specialist?  She has a 12:30 nuclear stress test today so she will be in the office this afternoon.

## 2014-04-07 NOTE — Progress Notes (Signed)
[facility] [address] [place], [place] [address] [phone]    Cardiology Nuclear Med Study  PARMINDER TRAPANI is a 80 y.o. female     MRN : [id number]     DOB: 05-22-1933  Procedure Date: 04/07/2014  Nuclear Med Background Indication for Stress Test:  Evaluation for Ischemia History:  CAD, Cath (n/o CAD), Echo 2015 EF 55-60%, MPI 2012 (normal)  EF 63% Cardiac Risk Factors: Hypertension, Lipids, PVD and TIA  Symptoms:  Fatigue and SOB   Nuclear Pre-Procedure Caffeine/Decaff Intake:  None NPO After: 9:00am   Lungs:  clear O2 Sat: 97% on room air. IV 0.9% NS with Angio Cath:  22g  IV Site: R Forearm  IV Started by:  Matilde Haymaker, RN  Chest Size (in):  36 Cup Size: D  Height: 5\' 8"  (1.727 m)  Weight:  237 lb (107.502 kg)  BMI:  Body mass index is 36.04 kg/(m^2). Tech Comments:  n/a    Nuclear Med Study 1 or 2 day study: 2 day  Stress Test Type:  Lexiscan  Reading MD: n/a  Order Authorizing Provider:  Filiberto Pinks  Resting Radionuclide: Technetium 92m Sestamibi  Resting Radionuclide Dose: 33.0 mCi on 04/08/14   Stress Radionuclide:  Technetium 6m Sestamibi  Stress Radionuclide Dose: 33.0 mCi on 04/07/14           Stress Protocol Rest HR: 82 Stress HR: 95  Rest BP: 125/47 Stress BP: 114/66  Exercise Time (min): n/a METS: n/a           Dose of Adenosine (mg):  n/a Dose of Lexiscan: 0.4 mg  Dose of Atropine (mg): n/a Dose of Dobutamine: n/a mcg/kg/min (at max HR)  Stress Test Technologist: Glade Lloyd, BS-ES  Nuclear Technologist:  Annye Rusk, CNMT     Rest Procedure:  Myocardial perfusion imaging was performed at rest 45 minutes following the intravenous administration of Technetium 11m Sestamibi. Rest ECG: NSR - Normal EKG  Stress Procedure:  The patient received IV Lexiscan 0.4 mg over 15-seconds.  Technetium 87m Sestamibi injected at 30-seconds.  Quantitative spect images were obtained after a 45 minute delay.  During  the infusion of Lexiscan the patient complained of a cough, SOB and head feeling funny.  These symptoms began to resolve except headache.  Perrin Maltese, EMT administered 75 mg aminophylline and the patient began to feel better.  Stress ECG: No significant change from baseline ECG  QPS Raw Data Images:  There is interference from nuclear activity from structures below the diaphragm. This does not affect the ability to read the study. Stress Images:  There is decreased uptake in the inferior wall. Rest Images:  There is decreased uptake in the inferior wall. Subtraction (SDS):  There is a fixed inferior defect that is most consistent with diaphragmatic attenuation. Transient Ischemic Dilatation (Normal <1.22):  1.00 Lung/Heart Ratio (Normal <0.45):  0.36  Quantitative Gated Spect Images QGS EDV:  87 ml QGS ESV:  34 ml  Impression Exercise Capacity:  Lexiscan with no exercise. BP Response:  Hypotensive blood pressure response. Clinical Symptoms:  No significant symptoms noted. ECG Impression:  No significant ECG changes with Lexiscan. Comparison with Prior Nuclear Study: No images to compare  Overall Impression:  Low risk stress nuclear study with small area of fixed inferobasal bowel attenuation artifact. No reversible ischemia.  LV Ejection Fraction: 61%.  LV Wall Motion:  NL LV Function; NL Wall Motion  Pixie Casino, MD, [facility] Board Certified  in Nuclear Cardiology Attending Cardiologist [place]

## 2014-04-08 ENCOUNTER — Encounter: Payer: Self-pay | Admitting: Internal Medicine

## 2014-04-08 ENCOUNTER — Ambulatory Visit (HOSPITAL_COMMUNITY)
Admission: RE | Admit: 2014-04-08 | Discharge: 2014-04-08 | Disposition: A | Discharge status: Home / Self Care | Payer: Medicare Other | Source: Ambulatory Visit | Attending: Cardiology | Admitting: Cardiology

## 2014-04-08 ENCOUNTER — Ambulatory Visit (HOSPITAL_BASED_OUTPATIENT_CLINIC_OR_DEPARTMENT_OTHER): Payer: Medicare Other

## 2014-04-08 DIAGNOSIS — E041 Nontoxic single thyroid nodule: Secondary | ICD-10-CM

## 2014-04-08 DIAGNOSIS — R0989 Other specified symptoms and signs involving the circulatory and respiratory systems: Secondary | ICD-10-CM

## 2014-04-08 DIAGNOSIS — E042 Nontoxic multinodular goiter: Secondary | ICD-10-CM | POA: Insufficient documentation

## 2014-04-08 MED ORDER — TECHNETIUM TC 99M SESTAMIBI GENERIC - CARDIOLITE
30.0000 | Freq: Once | INTRAVENOUS | Status: AC | PRN
  Administered 2014-04-08: 30 via INTRAVENOUS

## 2014-04-09 ENCOUNTER — Telehealth: Payer: Self-pay | Admitting: *Deleted

## 2014-04-09 DIAGNOSIS — E041 Nontoxic single thyroid nodule: Secondary | ICD-10-CM

## 2014-04-09 NOTE — Telephone Encounter (Signed)
Message copied by Nuala Alpha on Thu Apr 09, 2014  4:27 PM ------      Message from: Dorothy Spark      Created: Thu Apr 09, 2014  1:06 PM       Karlene Einstein,      Would you arrange a thyroid nodule biopsy for this patient?      She should also follow with her PCP for this.      Thank you,      K                  ----- Message -----         From: Rad Results In Interface         Sent: 04/08/2014   2:13 PM           To: Dorothy Spark, MD                   ------

## 2014-04-09 NOTE — Telephone Encounter (Signed)
Contacted pt about thyroid ultrasound results.  Per Dr Meda Coffee a thyroid nodule was noted on her ultrasound, and she should be set up to have this biopsied.  Also per Dr Meda Coffee this pt should also f/u with her PCP for this.  Informed pt that I will put this order in epic and send our schedulers a note to help get this procedure set up.  Informed pt that its important that she notify her PCP, Dr. Sarajane Jews tomorrow, to notify him of findings, so he can further review and recommend.  Pt verbalized understanding and agrees with this plan.

## 2014-04-10 ENCOUNTER — Telehealth: Payer: Self-pay | Admitting: Family Medicine

## 2014-04-10 NOTE — Telephone Encounter (Signed)
FYI Pt  is calling to let md know she will have her tsh biopsy on 04/21/14

## 2014-04-10 NOTE — Telephone Encounter (Signed)
FYI

## 2014-04-21 ENCOUNTER — Ambulatory Visit
Admission: RE | Admit: 2014-04-21 | Discharge: 2014-04-21 | Disposition: A | Discharge status: Home / Self Care | Payer: Medicare Other | Source: Ambulatory Visit | Attending: Cardiology | Admitting: Cardiology

## 2014-04-21 ENCOUNTER — Other Ambulatory Visit (HOSPITAL_COMMUNITY)
Admission: RE | Admit: 2014-04-21 | Discharge: 2014-04-21 | Disposition: A | Discharge status: Home / Self Care | Payer: Medicare Other | Source: Ambulatory Visit | Attending: Interventional Radiology | Admitting: Interventional Radiology

## 2014-04-21 DIAGNOSIS — E041 Nontoxic single thyroid nodule: Secondary | ICD-10-CM | POA: Diagnosis present

## 2014-04-27 ENCOUNTER — Ambulatory Visit (INDEPENDENT_AMBULATORY_CARE_PROVIDER_SITE_OTHER): Payer: Medicare Other | Admitting: Internal Medicine

## 2014-04-27 DIAGNOSIS — R0602 Shortness of breath: Secondary | ICD-10-CM

## 2014-04-27 LAB — PULMONARY FUNCTION TEST
DL/VA % pred: 83 %
DL/VA: 4.33 ml/min/mmHg/L
DLCO unc % pred: 77 %
DLCO unc: 22.3 ml/min/mmHg
FEF 25-75 Post: 2.08 L/sec
FEF 25-75 Pre: 1.57 L/sec
FEF2575-%Change-Post: 32 %
FEF2575-%Pred-Post: 125 %
FEF2575-%Pred-Pre: 94 %
FEV1-%Change-Post: 6 %
FEV1-%Pred-Post: 102 %
FEV1-%Pred-Pre: 95 %
FEV1-Post: 2.36 L
FEV1-Pre: 2.21 L
FEV1FVC-%Change-Post: 3 %
FEV1FVC-%Pred-Pre: 99 %
FEV6-%Change-Post: 3 %
FEV6-%Pred-Post: 106 %
FEV6-%Pred-Pre: 102 %
FEV6-Post: 3.1 L
FEV6-Pre: 2.99 L
FEV6FVC-%Change-Post: 0 %
FEV6FVC-%Pred-Post: 104 %
FEV6FVC-%Pred-Pre: 104 %
FVC-%Change-Post: 3 %
FVC-%Pred-Post: 101 %
FVC-%Pred-Pre: 97 %
FVC-Post: 3.12 L
FVC-Pre: 3.01 L
Post FEV1/FVC ratio: 76 %
Post FEV6/FVC ratio: 100 %
Pre FEV1/FVC ratio: 73 %
Pre FEV6/FVC Ratio: 99 %
RV % pred: 90 %
RV: 2.28 L
TLC % pred: 95 %
TLC: 5.27 L

## 2014-04-27 NOTE — Progress Notes (Signed)
PFT done today. 

## 2014-04-28 ENCOUNTER — Encounter: Payer: Self-pay | Admitting: Endocrinology

## 2014-04-28 ENCOUNTER — Other Ambulatory Visit: Payer: Self-pay | Admitting: *Deleted

## 2014-04-28 ENCOUNTER — Ambulatory Visit (INDEPENDENT_AMBULATORY_CARE_PROVIDER_SITE_OTHER): Payer: Medicare Other | Admitting: Endocrinology

## 2014-04-28 VITALS — BP 148/86 | HR 81 | Temp 98.0°F | Resp 16 | Ht 67.5 in | Wt 241.0 lb

## 2014-04-28 DIAGNOSIS — E119 Type 2 diabetes mellitus without complications: Secondary | ICD-10-CM | POA: Insufficient documentation

## 2014-04-28 DIAGNOSIS — E041 Nontoxic single thyroid nodule: Secondary | ICD-10-CM

## 2014-04-28 DIAGNOSIS — E1149 Type 2 diabetes mellitus with other diabetic neurological complication: Secondary | ICD-10-CM

## 2014-04-28 HISTORY — DX: Nontoxic single thyroid nodule: E04.1

## 2014-04-28 LAB — GLUCOSE, POCT (MANUAL RESULT ENTRY): POC GLUCOSE: 423 mg/dL — AB (ref 70–99)

## 2014-04-28 MED ORDER — METFORMIN HCL ER 500 MG PO TB24: ORAL_TABLET | ORAL | Status: DC

## 2014-04-28 NOTE — Progress Notes (Signed)
Patient ID: Melanie Mcdonald, female   DOB: [birth year], 80 y.o.   MRN: [id number]    Reason for Appointment: Consultation for Type 2 Diabetes  Referring physician: Sarajane Jews  History of Present Illness:          Diagnosis: Type 2 diabetes mellitus, date of diagnosis:  87 yr      Past history: She was probably started on metformin at diagnosis At some point she was also given glipizide ER 10 mg which was stopped in 2013 She thinks she has been on insulin for about 12 years, mostly taking Lantus insulin  In 03/2013 she stopped taking her metformin because at that time she was having difficulties with syncopal spells and she wanted to stop all her medications. Subsequently her A1c went up to 10.7%  Recent history:  She has been only on Lantus insulin for quite some time and has had poor control Her A1c has been near 10% consistently She does not check her blood sugars now and does not know when her blood sugars have started going up Because of higher readings she was started on 10 units of NovoLog mix insulin twice a day about 2 months ago but she is taking this at breakfast and before bedtime She is complaining of significant amount of weakness and fatigability as well as some tiredness in general Does not think she has excessive thirst or urination although she gets up twice at night She has not been following any diet and regularly drinks tea with sugar and chocolate milk Also not eating balanced meals  Oral hypoglycemic drugs the patient is taking are: None      Side effects from medications have been: None INSULIN regimen is described as: Lantus 100 hs with pen  Compliance with the medical regimen: poor Hypoglycemia: None  Glucose monitoring:  none for the last 4-5 weeks, meter is not working        Glucometer: ?       Glycemic control:   Lab Results  Component Value Date   HGBA1C 9.7* 12/10/2013   HGBA1C 10.7* 05/09/2013   HGBA1C 8.7* 01/15/2012   Lab Results  Component Value Date    LDLCALC 57 01/15/2012   CREATININE 0.74 02/23/2014    Retinal exam: Most recent: 5/15.   Self-care: The diet that the patient has been following is: None     Meals: 3 meals per day. Breakfast is variable, she had only cookies and milk today           Exercise: none         Dietician visit: Most recent:never.               Weight history: Previous range 150-241  Wt Readings from Last 3 Encounters:  04/28/14 241 lb (109.317 kg)  04/07/14 237 lb (107.502 kg)  04/01/14 241 lb (109.317 kg)       Medication List       This list is accurate as of: 04/28/14  9:32 AM.  Always use your most recent med list.               ACCU-CHEK AVIVA PLUS test strip  Generic drug:  glucose blood     albuterol 108 (90 BASE) MCG/ACT inhaler  Commonly known as:  PROVENTIL HFA;VENTOLIN HFA  Inhale 2 puffs into the lungs every 4 (four) hours as needed for wheezing or shortness of breath.     buPROPion 150 MG 24 hr tablet  Commonly known as:  WELLBUTRIN  XL  Take 1 tablet (150 mg total) by mouth daily.     diclofenac sodium 1 % Gel  Commonly known as:  VOLTAREN  Apply 4 g topically 4 (four) times daily as needed (for arthritis pain in shoulders and fingers). For joint pain.     Fluticasone-Salmeterol 250-50 MCG/DOSE Aepb  Commonly known as:  ADVAIR DISKUS  Inhale 1 puff into the lungs 2 (two) times daily.     furosemide 40 MG tablet  Commonly known as:  LASIX  TAKE 1 TABLET BY MOUTH TWICE DAILY     Insulin Aspart Prot & Aspart (70-30) 100 UNIT/ML Pen  Commonly known as:  NOVOLOG MIX 70/30 FLEXPEN  Inject 10 Units into the skin 2 (two) times daily.     insulin glargine 100 unit/mL Sopn  Commonly known as:  LANTUS  Inject 100 Units into the skin at bedtime.     ketoconazole 2 % cream  Commonly known as:  NIZORAL  Apply 1 application topically 2 (two) times daily as needed for irritation.     meclizine 25 MG tablet  Commonly known as:  ANTIVERT  Take 25 mg by mouth every 4 (four)  hours as needed for dizziness.     metolazone 2.5 MG tablet  Commonly known as:  ZAROXOLYN  Take 1 tablet (2.5 mg total) by mouth daily.     nitroGLYCERIN 0.4 MG SL tablet  Commonly known as:  NITROSTAT  Place 1 tablet (0.4 mg total) under the tongue every 5 (five) minutes as needed for chest pain.     oxyCODONE-acetaminophen 10-325 MG per tablet  Commonly known as:  PERCOCET  Take 1 tablet by mouth at bedtime.     potassium chloride 10 MEQ tablet  Commonly known as:  KLOR-CON 10  Take 1 tablet (10 mEq total) by mouth daily.     pramipexole 1 MG tablet  Commonly known as:  MIRAPEX  Take 3 mg by mouth at bedtime.     promethazine 25 MG tablet  Commonly known as:  PHENERGAN  Take 1 tablet (25 mg total) by mouth every 6 (six) hours as needed for nausea (nausea). For nausea.     zolpidem 10 MG tablet  Commonly known as:  AMBIEN  Take 10 mg by mouth at bedtime.        Allergies:  Allergies  Allergen Reactions  . Aspirin Other (See Comments)    Abdominal pain  . Doxycycline Itching  . Erythromycin Nausea And Vomiting  . Penicillins Swelling  . Sulfonamide Derivatives Nausea And Vomiting    Past Medical History  Diagnosis Date  . ANEMIA 01/30/2008  . ARTHRITIS 12/11/2007  . BACK PAIN, CHRONIC 06/23/2008  . BREAST CYST, RIGHT 12/17/2007  . CONTACT DERMATITIS 03/10/2009  . CORONARY ARTERY DISEASE 03/01/2007    had a normal Myoview stress test 07-13-11  . CYSTOCELE WITHOUT MENTION UTERINE PROLAPSE LAT 09/12/2010  . DEGENERATIVE JOINT DISEASE 08/07/2007  . DEPRESSION 03/01/2007  . DIABETES MELLITUS, TYPE II 08/26/2008  . Esophageal reflux 11/06/2007  . HYPERKERATOSIS 06/02/2009  . HYPERLIPIDEMIA 08/26/2008  . HYPERTENSION 03/01/2007  . Irritable bowel syndrome 01/26/2009  . LABYRINTHITIS 11/03/2009  . Narcolepsy without cataplexy 08/26/2008  . OBESITY 03/01/2007  . PERIPHERAL VASCULAR DISEASE 01/26/2009  . SYNCOPE 08/26/2008    had brain MRI on 06-28-12 showing only chronic  microvascular ischemia and atrophy   . TRANSIENT ISCHEMIC ATTACK 10/20/2009    had normal brain MRA with patent vertebrals and carotids 06-28-12  . NEPHROLITHIASIS  04/29/2009  . COPD (chronic obstructive pulmonary disease)   . Colon polyps     FRAGMENTS OF HYPERPLASTIC POLYP  . Diverticulosis of colon (without mention of hemorrhage)     Past Surgical History  Procedure Laterality Date  . Cataract extraction    . Abdominal hysterectomy    . Back surgery      x2  . Tonsilectomy, adenoidectomy, bilateral myringotomy and tubes    . Spine surgery      x 3  . Cardiac catheterization  07/2009  . Intraocular lens insertion    . Esophagogastroduodenoscopy  11-13-07    per Dr. Deatra Ina, normal   . Colonoscopy  11-13-07    per Dr. Deatra Ina, benign polyps, repeat in 5 yrs    Family History  Problem Relation Age of Onset  . Lupus Mother   . COPD Father     Social History:  reports that she has never smoked. She has never used smokeless tobacco. She reports that she does not drink alcohol or use illicit drugs.    Review of Systems       Lipids: She has hyperlipidemia with history of CAD but is not currently on any medications and has not had any recent labs       Lab Results  Component Value Date   CHOL 134 01/15/2012   HDL 60.30 01/15/2012   LDLCALC 57 01/15/2012   TRIG 83.0 01/15/2012   CHOLHDL 2 01/15/2012               Syncopal spells: She started having these last year and these are better now but she still complains of significant dizziness.     Skin: No rash or infections     Thyroid:  Complains of  persistent fatigue.  Lab Results  Component Value Date   TSH 0.71 12/10/2013   TSH 0.76 01/15/2012   TSH 0.85 05/25/2011   She had a 1.7 cm left thyroid nodule discovered incidentally on her CT scan. This is benign on recent needle biopsy     The blood pressure has been normal recently without medications     No swelling of feet.     She has had significant shortness of breath  on exertion and she was told she has fluid on the lungs, currently taking Lasix and potassium. She also has a history of narcolepsy     Bowel habits: Normal.       No frequency of urination or nocturia       No joint  pains.          No history of  tingling or burning in feet but does have some numbness. Also is taking treatment for restless leg syndrome     Has history of insomnia and is  also on Wellbutrin for depression    Physical Examination:  BP 148/86  Pulse 81  Temp(Src) 98 F (36.7 C)  Resp 16  Ht 5' 7.5" (1.715 m)  Wt 241 lb (109.317 kg)  BMI 37.17 kg/m2  SpO2 93%  GENERAL:         Patient has generalized obesity.   HEENT:         Eye exam shows normal external appearance. Fundus exam shows no retinopathy. Oral exam shows normal mucosa .  NECK:         General:  Neck exam shows no lymphadenopathy. Carotids are normal to palpation and no bruit heard.  Thyroid is not enlarged and no nodules felt on either side.  LUNGS:         Chest is symmetrical. Lungs are clear to auscultation.Marland Kitchen   HEART:         Heart sounds:  S1 and S2 are normal. No murmurs or clicks heard., no S3 or S4.   ABDOMEN:   There is no distention present. Liver and spleen are not palpable. No other mass or tenderness present.  EXTREMITIES:     There is no edema. No skin lesions present.Marland Kitchen  NEUROLOGICAL:   Vibration sense is moderately reduced in toes. Ankle jerks are absent bilaterally.          Diabetic foot exam:  She has 2+ edema at the ankles. Monofilament sensation present but decreased in the toes Dorsalis pedis pulses 3/4 and posterior tibialis not palpable because of edema No skin lesions on plantar surfaces MUSCULOSKELETAL:       There is no enlargement or deformity of the joints. Spine is normal to inspection.Marland Kitchen   SKIN:       No rash or lesions of concern.        ASSESSMENT:  Diabetes type 2, uncontrolled     Her A1c has been around 10% since last year and she appears to be insulin  resistant with taking 100 units of basal insulin and 20 units of premixed insulin without improvement in blood sugar She has poor understanding of diabetes and little motivation to do any self-care Has not checked her blood sugars at home but discussed with her that her glucose is over 400 in the office today See history of present illness for details on her current day-to-day management, problems identified She has significant lack of knowledge of meal planning and is eating unbalanced meals and drinking drinks with sugar consistently Currently not on any insulin sensitizers  Complications:Has signs of neuropathy, apparently also has history of some peripheral vascular disease and CAD No recent urine microalbumin available  Benign thyroid nodule On the left, this is not palpable and clinically insignificant. Also has been euthyroid with normal TSH as of 3/15   History of hyperlipidemia and CAD, needs followup lipid levels  PLAN:   Since she does not want to buy any new types of insulin will have her use up her premixed insulin but have her take this before meals 3 times a day to provide mealtime control. Insulin instructions as below  Also in the meantime will reduce her Lantus to avoid overnight hypoglycemia  She needs at least metformin as an insulin sensitizer and reassured her that this is safe for her and will not cause any dizziness. Will also need to titrate this to at least 1500 mg daily  May also consider a combination of Invokana and metformin on her next visit if she is not improving. This will also help her fluid overload as well as provide some weight loss  She was shown how to use a Bayer contour meter today and discussed timing and frequency of glucose monitoring as well as blood sugar targets  She will eliminate simple sugars from her diet and followup with nutritionist or educator for meal planning  She will try to a balanced meals with protein  Written instructions  given  She would benefit from more activity but she is currently having intercurrent medical problems preventing her from exercising  Patient Instructions  Please check blood sugars at least half the time about 2 hours after any meal and 2-4 times a week on waking up. Check blood sugars at least twice a  day  Please bring blood sugar monitor to each visit  Stop drinking chocolate milk and sweet tea. May use artificial sweeteners. Drink plenty of water  Reduce Lantus insulin to 80 units at bedtime  NovoLog mix insulin: Take 25 units before breakfast, 15 units before lunch and 25 units before supper. Must take this at the mealtime  Start metformin ER 500 mg twice a day     Total visit time including counseling = 60 minutes  Mavery Milling 04/28/2014, 9:32 AM   Note: This office note was prepared with Estate agent. Any transcriptional errors that result from this process are unintentional.

## 2014-04-28 NOTE — Patient Instructions (Addendum)
Please check blood sugars at least half the time about 2 hours after any meal and 2-4 times a week on waking up. Check blood sugars at least twice a day  Please bring blood sugar monitor to each visit  Stop drinking chocolate milk and sweet tea. May use artificial sweeteners. Drink plenty of water  Reduce Lantus insulin to 80 units at bedtime  NovoLog mix insulin: Take 25 units before breakfast, 15 units before lunch and 25 units before supper. Must take this at the mealtime  Start metformin ER 500 mg twice a day

## 2014-05-04 ENCOUNTER — Telehealth: Payer: Self-pay | Admitting: *Deleted

## 2014-05-04 NOTE — Telephone Encounter (Signed)
Message copied by Nuala Alpha on Mon May 04, 2014  3:29 PM ------      Message from: Dorothy Spark      Created: Mon May 04, 2014 12:12 AM       Thyroid biopsy showed just benign nodule.      Please let her know,      Thank you,      KN ------

## 2014-05-04 NOTE — Telephone Encounter (Signed)
Called pt multiple times to inform her of her recent thyroid biopsy showing a benign nodule.  Also called [name] pts EC to contact us at the office to inform of thyroid biopsy results and PFT results.

## 2014-05-05 ENCOUNTER — Encounter: Payer: Self-pay | Admitting: *Deleted

## 2014-05-06 ENCOUNTER — Telehealth: Payer: Self-pay | Admitting: Cardiology

## 2014-05-06 NOTE — Telephone Encounter (Signed)
New message ° ° ° ° ° °Returning Ivy's call °

## 2014-05-06 NOTE — Telephone Encounter (Signed)
LMTCB

## 2014-05-06 NOTE — Telephone Encounter (Signed)
Got in contact with pt about her thyroid biopsy results and pft results.  See result notes for further details.  Pt verbalized understanding and pleased with these results.

## 2014-05-11 ENCOUNTER — Telehealth: Payer: Self-pay | Admitting: Cardiology

## 2014-05-11 NOTE — Telephone Encounter (Signed)
Pt contacted Dr Meda Coffee and nurse to let us know that she got an appt scheduled with her PCP for this Thursday 8/13 at 0945am.  Pt states she will f/u up with [place] based on Dr Barbie Banner assessment.

## 2014-05-11 NOTE — Telephone Encounter (Signed)
New message     Pt is extremely tired.  Will a thyroid test show why she is tired?

## 2014-05-11 NOTE — Telephone Encounter (Signed)
Pt calling to ask Dr Meda Coffee if her thyroid would have any reason why she is feeling very tired.  Pt states she went to her scheduled appt with Dr Dwyane Dee Endocrinologist on 7/28.  Noted future labs placed by Dr Dwyane Dee, but was not performed.  Asked pt if she is being compliant with meds prescribed.  Pt states she has not been as compliant as she should, especially with her Diabetes.  Stressed the importance of medication compliance to pt, especially monitoring her BS and taking insulin as prescribed.  Asked pt the last time she has seen her PCP.  Pt states its been a couple of months.  Informed pt to contact her PCP either today or tomorrow and f/u with me thereafter.  Stressed the importance to pt that she get with her PCP and go over current meds and diabetes management.  Pt denies any cardiac complaints at this time.  Pt denies any sob.  Pt states "the only people who listen to me are you and Dr Meda Coffee." Informed pt that I will route this to Dr Meda Coffee for further review, but she needs to be compliant with taking her meds and she needs to call her PCP to schedule an appt and let us know when this is.  Asked pt if she has anyone in the home who can help with meal preparation and help her manage her meds.  Pt states "my son comes on the weekends and stays with me." Pt states she is ok to manage in the home herself.

## 2014-05-14 ENCOUNTER — Ambulatory Visit (INDEPENDENT_AMBULATORY_CARE_PROVIDER_SITE_OTHER): Payer: Medicare Other | Admitting: Family Medicine

## 2014-05-14 ENCOUNTER — Encounter: Payer: Self-pay | Admitting: Family Medicine

## 2014-05-14 VITALS — BP 156/75 | HR 81 | Temp 98.4°F | Ht 67.5 in | Wt 241.0 lb

## 2014-05-14 DIAGNOSIS — E785 Hyperlipidemia, unspecified: Secondary | ICD-10-CM

## 2014-05-14 DIAGNOSIS — H8309 Labyrinthitis, unspecified ear: Secondary | ICD-10-CM

## 2014-05-14 DIAGNOSIS — G47419 Narcolepsy without cataplexy: Secondary | ICD-10-CM

## 2014-05-14 DIAGNOSIS — M549 Dorsalgia, unspecified: Secondary | ICD-10-CM

## 2014-05-14 DIAGNOSIS — M1990 Unspecified osteoarthritis, unspecified site: Secondary | ICD-10-CM

## 2014-05-14 DIAGNOSIS — E1149 Type 2 diabetes mellitus with other diabetic neurological complication: Secondary | ICD-10-CM

## 2014-05-14 DIAGNOSIS — R609 Edema, unspecified: Secondary | ICD-10-CM

## 2014-05-14 DIAGNOSIS — G459 Transient cerebral ischemic attack, unspecified: Secondary | ICD-10-CM

## 2014-05-14 MED ORDER — PRAMIPEXOLE DIHYDROCHLORIDE 1 MG PO TABS: 3.0000 mg | ORAL_TABLET | Freq: Every day | ORAL | Status: DC

## 2014-05-14 MED ORDER — POTASSIUM CHLORIDE ER 10 MEQ PO TBCR: 10.0000 meq | EXTENDED_RELEASE_TABLET | Freq: Every day | ORAL | Status: DC

## 2014-05-14 MED ORDER — PROMETHAZINE HCL 25 MG PO TABS: 25.0000 mg | ORAL_TABLET | Freq: Four times a day (QID) | ORAL | Status: DC | PRN

## 2014-05-14 MED ORDER — MECLIZINE HCL 25 MG PO TABS: 25.0000 mg | ORAL_TABLET | ORAL | Status: DC | PRN

## 2014-05-14 MED ORDER — OXYCODONE-ACETAMINOPHEN 10-325 MG PO TABS: 1.0000 | ORAL_TABLET | Freq: Four times a day (QID) | ORAL | Status: DC | PRN

## 2014-05-14 MED ORDER — ZOLPIDEM TARTRATE 10 MG PO TABS: 10.0000 mg | ORAL_TABLET | Freq: Every day | ORAL | Status: DC

## 2014-05-14 NOTE — Progress Notes (Signed)
Pre visit review using our clinic review tool, if applicable. No additional management support is needed unless otherwise documented below in the visit note. 

## 2014-05-14 NOTE — Progress Notes (Signed)
   Subjective:    Patient ID: Melanie Mcdonald, female    DOB: 29-Dec-1932, 80 y.o.   MRN: [id number]  HPI Here to follow up. She is doing well in general except her low back pain continues to worsen. She has trouble getting around even while using her cane. She uses Percocet to help with pain. She last saw Dr. Tonita Cong about 2 years ago. She is seeing Dr. Dwyane Dee for her diabetes.    Review of Systems  Constitutional: Positive for fatigue.  Respiratory: Negative.   Cardiovascular: Negative.   Neurological: Positive for dizziness and weakness. Negative for tremors, syncope, speech difficulty, numbness and headaches.       Objective:   Physical Exam  Constitutional: She is oriented to person, place, and time. She appears well-developed.  Neck: No thyromegaly present.  Cardiovascular: Normal rate, regular rhythm, normal heart sounds and intact distal pulses.   Pulmonary/Chest: Effort normal and breath sounds normal.  Lymphadenopathy:    She has no cervical adenopathy.  Neurological: She is alert and oriented to person, place, and time.          Assessment & Plan:  She seems stable other than her back pain. meds were refilled. We will arrange for her to see Dr. Tonita Cong again.

## 2014-06-03 ENCOUNTER — Encounter: Payer: Self-pay | Admitting: Family Medicine

## 2014-06-03 ENCOUNTER — Ambulatory Visit (INDEPENDENT_AMBULATORY_CARE_PROVIDER_SITE_OTHER): Payer: Medicare Other | Admitting: Family Medicine

## 2014-06-03 VITALS — BP 153/75 | HR 87 | Temp 98.1°F

## 2014-06-03 DIAGNOSIS — M25559 Pain in unspecified hip: Secondary | ICD-10-CM

## 2014-06-03 DIAGNOSIS — I10 Essential (primary) hypertension: Secondary | ICD-10-CM

## 2014-06-03 DIAGNOSIS — M549 Dorsalgia, unspecified: Secondary | ICD-10-CM

## 2014-06-03 DIAGNOSIS — E1149 Type 2 diabetes mellitus with other diabetic neurological complication: Secondary | ICD-10-CM

## 2014-06-03 DIAGNOSIS — M1990 Unspecified osteoarthritis, unspecified site: Secondary | ICD-10-CM

## 2014-06-03 MED ORDER — OXYCODONE-ACETAMINOPHEN 10-325 MG PO TABS: 1.0000 | ORAL_TABLET | Freq: Four times a day (QID) | ORAL | Status: DC | PRN

## 2014-06-03 MED ORDER — INSULIN ASPART PROT & ASPART (70-30 MIX) 100 UNIT/ML PEN: PEN_INJECTOR | SUBCUTANEOUS | Status: DC

## 2014-06-03 NOTE — Progress Notes (Signed)
Pre visit review using our clinic review tool, if applicable. No additional management support is needed unless otherwise documented below in the visit note. 

## 2014-06-03 NOTE — Progress Notes (Signed)
   Subjective:    Patient ID: Melanie Mcdonald, female    DOB: 11-02-1932, 80 y.o.   MRN: [id number]  HPI Here for refills of meds. She had been doing well until this past weekend when she pruned her persimmon tree. Since then her low back pain has flared up.    Review of Systems  Constitutional: Negative.   Respiratory: Negative.   Cardiovascular: Negative.   Musculoskeletal: Positive for back pain.       Objective:   Physical Exam  Constitutional: She is oriented to person, place, and time. She appears well-developed and well-nourished.  In pain   Cardiovascular: Normal rate, regular rhythm, normal heart sounds and intact distal pulses.   Pulmonary/Chest: Effort normal and breath sounds normal.  Neurological: She is alert and oriented to person, place, and time.          Assessment & Plan:  Refilled meds. Use heat on the back.

## 2014-06-15 ENCOUNTER — Encounter: Payer: Medicare Other | Attending: Endocrinology | Admitting: *Deleted

## 2014-06-15 ENCOUNTER — Encounter: Payer: Self-pay | Admitting: *Deleted

## 2014-06-15 DIAGNOSIS — E1149 Type 2 diabetes mellitus with other diabetic neurological complication: Secondary | ICD-10-CM

## 2014-06-15 DIAGNOSIS — Z713 Dietary counseling and surveillance: Secondary | ICD-10-CM | POA: Diagnosis not present

## 2014-06-15 DIAGNOSIS — E119 Type 2 diabetes mellitus without complications: Secondary | ICD-10-CM | POA: Diagnosis not present

## 2014-06-15 DIAGNOSIS — Z794 Long term (current) use of insulin: Secondary | ICD-10-CM | POA: Diagnosis not present

## 2014-06-15 NOTE — Progress Notes (Signed)
Appt start time: 0900 end time:  1030.  Assessment:  Patient was seen on  06/15/14 for individual diabetes education. History of Diabetes for over 20 years. She lives with her youngest son who is out of town often, she shops for her own food, does very little cooking. She does some take out, and prepares simple meals with microwave. SMBG maybe twice a week with reported range of 125 - 200 mg/dl. Infrequent hypoglycemia reported. She states she takes her insulin very carefully, occasionally delays meal time insulin until after the meal if she forgets to take it before the meal. She is retired from [employer]. 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 She states she has feelings of hopelessness and started crying, I provided her with business card for [facility] and encouraged her to consider counseling with a therapist as needed.  Patient Education Plan per assessed needs and concerns is to attend individual session for Diabetes Self Management Education.  Current HbA1c: 9.7%  Preferred Learning Style: No preference indicated   Learning Readiness:   Contemplating  MEDICATIONS: see list  DIETARY INTAKE:  24-hr recall:  B (11 AM): scrambled eggs with cheese with 16 oz chocolate milk OR the milk by itself,  Snk ( AM): no  L ( PM): skips due to late breakfast Snk ( PM): occasional PNB crackers D (7 PM): occasionally some meat, vegetables, starch like dumplings, iced tea with sugar or Splenda Snk ( PM): 16 oz chocolate milk Beverages: iced tea with sugar or Splenda, chocolate milk  Usual physical activity: gardening  Estimated energy needs: 1500 calories 170 g carbohydrates 112 g protein 42 g fat  Progress Towards Goal(s):  In progress.   Nutritional Diagnosis:  NB-1.1 Food and nutrition-related knowledge deficit As related to diabetes.  As evidenced by A1c of 9.7%.    Intervention:  Nutrition counseling  provided.  Discussed diabetes disease process and treatment options.  Discussed physiology of diabetes and role of obesity on insulin resistance.     Provided education on macronutrients on glucose levels.  Provided basic education on carb counting, importance of regularly scheduled meals/snacks, and meal planning  Discussed blood glucose monitoring and interpretation.  Discussed recommended target ranges and individual ranges.   Discussed effects of physical activity on glucose levels and long-term glucose control.  Encouraged her to continue with her physical activity/week.  Described short-term complications: hyper- and hypo-glycemia.  Discussed causes,symptoms, and treatment options.  Discussed role of stress on blood glucose levels and discussed strategies to manage psychosocial issues.  Discussed foot care and encouraged her to examine her feet with a mirror as needed to increase awareness of any damage to her feet so she can get treatment immediately as needed.  Plan at next visit to:  Reviewed patient medications.  Discussed role of medication on blood glucose and possible side effects  Discussed prevention, detection, and treatment of long-term complications.  Discussed the role of prolonged elevated glucose levels on body systems.  Discussed recommendations for long-term diabetes self-care.  Established checklist for medical, dental, and emotional self-care.  Plan:  Aim for 3 Carb Choices per meal (45 grams) +/- 1 either way  Aim for 0-1 Carbs per snack if hungry  Include protein in moderation with your meals and snacks Continue with your activity level by gardening as tolerated Consider checking BG at alternate times per day as directed by MD  Continue taking medication: insulin as directed by MD  Teaching Method  Utilized: Games developer given during visit include: Living Well with Diabetes Carb Counting handouts Meal Plan Card  Barriers to  learning/adherence to lifestyle change: Depression, severe pain  Diabetes self-care support plan:   [facility] support group available  Demonstrated degree of understanding via:  Teach Back   Monitoring/Evaluation:  Dietary intake, exercise, and body weight prn. Patient took my card, will call for follow up visit when she is ready.

## 2014-06-15 NOTE — Patient Instructions (Signed)
Plan:  Aim for 3 Carb Choices per meal (45 grams) +/- 1 either way  Aim for 0-1 Carbs per snack if hungry  Include protein in moderation with your meals and snacks Continue with your activity level by gardening as tolerated Consider checking BG at alternate times per day as directed by MD  Continue taking medication: insulin as directed by MD

## 2014-06-26 ENCOUNTER — Encounter: Payer: Self-pay | Admitting: Family Medicine

## 2014-06-26 ENCOUNTER — Ambulatory Visit (INDEPENDENT_AMBULATORY_CARE_PROVIDER_SITE_OTHER): Payer: Medicare Other | Admitting: Family Medicine

## 2014-06-26 VITALS — BP 148/72 | HR 91 | Temp 98.1°F | Wt 237.0 lb

## 2014-06-26 DIAGNOSIS — Z23 Encounter for immunization: Secondary | ICD-10-CM

## 2014-06-26 DIAGNOSIS — H109 Unspecified conjunctivitis: Secondary | ICD-10-CM

## 2014-06-26 MED ORDER — KETOCONAZOLE 2 % EX CREA: 1.0000 "application " | TOPICAL_CREAM | Freq: Two times a day (BID) | CUTANEOUS | Status: DC | PRN

## 2014-06-26 MED ORDER — TOBRAMYCIN 0.3 % OP SOLN: 2.0000 [drp] | OPHTHALMIC | Status: DC

## 2014-06-26 NOTE — Progress Notes (Signed)
Pre visit review using our clinic review tool, if applicable. No additional management support is needed unless otherwise documented below in the visit note. 

## 2014-06-26 NOTE — Progress Notes (Signed)
   Subjective:    Patient ID: Melanie Mcdonald, female    DOB: 20-Feb-1933, 80 y.o.   MRN: [id number]  Eye Pain  Associated symptoms include an eye discharge and eye redness.   Acute visit. Patient seen with left eye symptoms. She started having some matting drainage couple days ago. No injury. No blurred vision. Symptoms are slightly better today after using saline drop. She's had previous cataract surgery.  Past Medical History  Diagnosis Date  . ANEMIA 01/30/2008  . ARTHRITIS 12/11/2007  . BACK PAIN, CHRONIC 06/23/2008  . BREAST CYST, RIGHT 12/17/2007  . CONTACT DERMATITIS 03/10/2009  . CORONARY ARTERY DISEASE 03/01/2007    had a normal Myoview stress test 07-13-11  . CYSTOCELE WITHOUT MENTION UTERINE PROLAPSE LAT 09/12/2010  . DEGENERATIVE JOINT DISEASE 08/07/2007  . DEPRESSION 03/01/2007  . Esophageal reflux 11/06/2007  . HYPERKERATOSIS 06/02/2009  . HYPERLIPIDEMIA 08/26/2008  . HYPERTENSION 03/01/2007  . Irritable bowel syndrome 01/26/2009  . LABYRINTHITIS 11/03/2009  . Narcolepsy without cataplexy 08/26/2008  . OBESITY 03/01/2007  . PERIPHERAL VASCULAR DISEASE 01/26/2009  . SYNCOPE 08/26/2008    had brain MRI on 06-28-12 showing only chronic microvascular ischemia and atrophy   . TRANSIENT ISCHEMIC ATTACK 10/20/2009    had normal brain MRA with patent vertebrals and carotids 06-28-12  . NEPHROLITHIASIS 04/29/2009  . COPD (chronic obstructive pulmonary disease)   . Colon polyps     FRAGMENTS OF HYPERPLASTIC POLYP  . Diverticulosis of colon (without mention of hemorrhage)   . DIABETES MELLITUS, TYPE II 08/26/2008    sees Dr. Dwyane Dee    Past Surgical History  Procedure Laterality Date  . Cataract extraction    . Abdominal hysterectomy    . Back surgery      x2  . Tonsilectomy, adenoidectomy, bilateral myringotomy and tubes    . Spine surgery      x 3  . Cardiac catheterization  07/2009  . Intraocular lens insertion    . Esophagogastroduodenoscopy  11-13-07    per Dr. Deatra Ina, normal   .  Colonoscopy  11-13-07    per Dr. Deatra Ina, benign polyps, repeat in 5 yrs    reports that she has never smoked. She has never used smokeless tobacco. She reports that she does not drink alcohol or use illicit drugs. family history includes COPD in her father; Lupus in her mother. Allergies  Allergen Reactions  . Aspirin Other (See Comments)    Abdominal pain  . Doxycycline Itching  . Erythromycin Nausea And Vomiting  . Penicillins Swelling  . Sulfonamide Derivatives Nausea And Vomiting      Review of Systems  Eyes: Positive for pain, discharge and redness. Negative for visual disturbance.  Neurological: Negative for headaches.       Objective:   Physical Exam  Constitutional: She appears well-developed and well-nourished.  Eyes:  Left conjunctiva is erythematous compared to the right. There's minimal yellowish drainage. Cornea reveals no acute changes. Extraocular movements are normal  Cardiovascular: Normal rate.           Assessment & Plan:  Bacterial conjunctivitis left eye. Warm compresses several times daily. Tobramycin eyedrops to left eye every 4-6 hours while awake. Follow up with primary by Monday if not improved

## 2014-06-26 NOTE — Patient Instructions (Signed)

## 2014-07-07 ENCOUNTER — Other Ambulatory Visit: Payer: Self-pay | Admitting: Family Medicine

## 2014-07-14 ENCOUNTER — Ambulatory Visit (INDEPENDENT_AMBULATORY_CARE_PROVIDER_SITE_OTHER): Payer: Medicare Other | Admitting: Family Medicine

## 2014-07-14 ENCOUNTER — Encounter: Payer: Self-pay | Admitting: Family Medicine

## 2014-07-14 VITALS — BP 160/87 | HR 89 | Temp 98.2°F | Ht 67.5 in | Wt 242.0 lb

## 2014-07-14 DIAGNOSIS — M150 Primary generalized (osteo)arthritis: Secondary | ICD-10-CM

## 2014-07-14 DIAGNOSIS — G47419 Narcolepsy without cataplexy: Secondary | ICD-10-CM

## 2014-07-14 DIAGNOSIS — M159 Polyosteoarthritis, unspecified: Secondary | ICD-10-CM

## 2014-07-14 DIAGNOSIS — H5712 Ocular pain, left eye: Secondary | ICD-10-CM

## 2014-07-14 DIAGNOSIS — G2581 Restless legs syndrome: Secondary | ICD-10-CM

## 2014-07-14 MED ORDER — PRAMIPEXOLE DIHYDROCHLORIDE 1 MG PO TABS: 6.0000 mg | ORAL_TABLET | Freq: Every day | ORAL | Status: DC

## 2014-07-14 MED ORDER — AMPHETAMINE-DEXTROAMPHET ER 20 MG PO CP24: 20.0000 mg | ORAL_CAPSULE | Freq: Every day | ORAL | Status: DC

## 2014-07-14 MED ORDER — HYDROMORPHONE HCL 8 MG PO TABS: 8.0000 mg | ORAL_TABLET | Freq: Four times a day (QID) | ORAL | Status: DC | PRN

## 2014-07-14 NOTE — Progress Notes (Signed)
   Subjective:    Patient ID: Melanie Mcdonald, female    DOB: 1933-06-17, 80 y.o.   MRN: [id number]  HPI Here for several issues. First her left eye has been bothering her intermittently for the past 2 months. Sometimes it burns and is painful, sometimes it turns red, sometimes her vision is blurry. She was seen here on 06-26-14 and was given Tobramycin drops. This helped a little and the redness went away, but she still has discomfort in the eye and it tears a lot. Sometimes bright lights cause pain in the eye. Second, her restless legs are acting up and she asks to increase the dose of her Mirapex. Third her narcolepsy has gotten worse and she wants to get back on meds for this. She has tried [place] and Dexadrine in the past with mixed results. Last she has had more pain in there lower back and hips, and her Percocet no longer gives her much relief.    Review of Systems  Constitutional: Negative.   HENT: Negative.   Eyes: Positive for photophobia, pain, redness and visual disturbance. Negative for discharge and itching.  Respiratory: Negative.   Musculoskeletal: Positive for arthralgias and back pain.  Neurological: Negative.        Objective:   Physical Exam  Constitutional: She is oriented to person, place, and time. She appears well-developed and well-nourished.  Eyes: Conjunctivae and EOM are normal. Pupils are equal, round, and reactive to light. Right eye exhibits no discharge. Left eye exhibits no discharge. No scleral icterus.  Neck: Neck supple. No thyromegaly present.  Cardiovascular: Normal rate, regular rhythm, normal heart sounds and intact distal pulses.   Pulmonary/Chest: Effort normal and breath sounds normal.  Lymphadenopathy:    She has no cervical adenopathy.  Neurological: She is alert and oriented to person, place, and time. No cranial nerve deficit.          Assessment & Plan:  Her eye seems to have chronic inflammation and etiologies such as iritis need to  be considered. We will refer her to Ophthalmology stat. Increase Mirapex to 6 mg daily. Switch for Percocet to Dilaudid 8 mg to use prn. Try Adderall XR 30 mg daily for the narcolepsy.

## 2014-07-14 NOTE — Progress Notes (Signed)
Pre visit review using our clinic review tool, if applicable. No additional management support is needed unless otherwise documented below in the visit note. 

## 2014-07-16 LAB — HM DIABETES EYE EXAM

## 2014-07-27 ENCOUNTER — Telehealth: Payer: Self-pay | Admitting: Family Medicine

## 2014-07-27 NOTE — Telephone Encounter (Signed)
Pt request refill  dextroamphetamine (DEXEDRINE SPANSULE) 10 MG 24 hr capsule Pt has not had the rx since 07/2012. Pt states she thought you understood the other day at 10/13 appt this is what she wanted, and she did not fill the amphetamine-dextroamphetamine (ADDERALL XR) 20 MG 24 hr capsule Insurance would not fill and she does not think that is what she needs. [facility]/  [address]

## 2014-07-28 MED ORDER — DEXTROAMPHETAMINE SULFATE ER 10 MG PO CP24: 20.0000 mg | ORAL_CAPSULE | Freq: Every day | ORAL | Status: DC

## 2014-07-28 NOTE — Telephone Encounter (Signed)
done

## 2014-07-28 NOTE — Telephone Encounter (Signed)
Script is ready for pick up and I spoke with pt.  

## 2014-08-03 ENCOUNTER — Encounter: Payer: Self-pay | Admitting: Family Medicine

## 2014-08-19 ENCOUNTER — Telehealth: Payer: Self-pay | Admitting: Family Medicine

## 2014-08-19 NOTE — Telephone Encounter (Signed)
In October we switched from oxycodone to hydromorphone. Which one does she want this time?

## 2014-08-19 NOTE — Telephone Encounter (Signed)
I left a voice message for pt to call back and clarify which medication she needed the refill on?

## 2014-08-19 NOTE — Telephone Encounter (Signed)
Pt request refill of the following:  OXYCODONE    Phamacy:

## 2014-08-20 MED ORDER — OXYCODONE-ACETAMINOPHEN 10-325 MG PO TABS: 1.0000 | ORAL_TABLET | Freq: Four times a day (QID) | ORAL | Status: DC | PRN

## 2014-08-20 NOTE — Telephone Encounter (Signed)
Script is ready for pick up and I spoke with pt.  

## 2014-08-20 NOTE — Telephone Encounter (Signed)
done

## 2014-08-20 NOTE — Telephone Encounter (Signed)
I spoke with pt and the hydromorphone did not work, she wants to go back to the Oxycodone and call when ready for pick up.

## 2014-09-07 ENCOUNTER — Ambulatory Visit (INDEPENDENT_AMBULATORY_CARE_PROVIDER_SITE_OTHER): Payer: Medicare Other | Admitting: Family Medicine

## 2014-09-07 ENCOUNTER — Encounter: Payer: Self-pay | Admitting: Family Medicine

## 2014-09-07 VITALS — BP 156/82 | HR 89 | Temp 98.7°F

## 2014-09-07 DIAGNOSIS — J0100 Acute maxillary sinusitis, unspecified: Secondary | ICD-10-CM

## 2014-09-07 MED ORDER — BUPROPION HCL ER (XL) 150 MG PO TB24: 150.0000 mg | ORAL_TABLET | Freq: Every day | ORAL | Status: DC

## 2014-09-07 MED ORDER — HYDROCODONE-HOMATROPINE 5-1.5 MG/5ML PO SYRP: 5.0000 mL | ORAL_SOLUTION | ORAL | Status: DC | PRN

## 2014-09-07 MED ORDER — FUROSEMIDE 40 MG PO TABS: ORAL_TABLET | ORAL | Status: DC

## 2014-09-07 MED ORDER — LEVOFLOXACIN 500 MG PO TABS: 500.0000 mg | ORAL_TABLET | Freq: Every day | ORAL | Status: AC

## 2014-09-07 NOTE — Progress Notes (Signed)
   Subjective:    Patient ID: Melanie Mcdonald, female    DOB: 1933/05/12, 80 y.o.   MRN: [id number]  HPI Here for one week of sinus pressure, right ear pain, ST, and a dry cough.    Review of Systems  Constitutional: Negative.   HENT: Positive for congestion, ear pain, postnasal drip and sinus pressure.   Eyes: Negative.   Respiratory: Positive for cough.        Objective:   Physical Exam  Constitutional: She appears well-developed and well-nourished.  HENT:  Right Ear: External ear normal.  Left Ear: External ear normal.  Nose: Nose normal.  Mouth/Throat: Oropharynx is clear and moist.  Eyes: Conjunctivae are normal.  Pulmonary/Chest: Effort normal and breath sounds normal.  Lymphadenopathy:    She has no cervical adenopathy.          Assessment & Plan:  Add Mucinex D

## 2014-09-07 NOTE — Progress Notes (Signed)
Pre visit review using our clinic review tool, if applicable. No additional management support is needed unless otherwise documented below in the visit note. 

## 2014-09-22 ENCOUNTER — Telehealth: Payer: Self-pay | Admitting: Family Medicine

## 2014-09-22 NOTE — Telephone Encounter (Signed)
Call in Biaxin 500 mg bid for 10 days  

## 2014-09-22 NOTE — Telephone Encounter (Signed)
Patient states she still has the ear ache, sore throat, no fever.  She said she was seen 09/07/14 for the same thing and she still feels the same.  She would like another antibiotic called in to [id number] - [place], [address] AT [address].  Please advise.

## 2014-09-23 MED ORDER — CLARITHROMYCIN 500 MG PO TABS: 500.0000 mg | ORAL_TABLET | Freq: Two times a day (BID) | ORAL | Status: DC

## 2014-09-23 NOTE — Telephone Encounter (Signed)
I spoke with pt and sent script e-scribe. 

## 2014-10-28 ENCOUNTER — Encounter: Payer: Self-pay | Admitting: Family Medicine

## 2014-10-28 ENCOUNTER — Ambulatory Visit (INDEPENDENT_AMBULATORY_CARE_PROVIDER_SITE_OTHER): Payer: Medicare Other | Admitting: Family Medicine

## 2014-10-28 VITALS — BP 172/94 | HR 99 | Temp 98.4°F

## 2014-10-28 DIAGNOSIS — I10 Essential (primary) hypertension: Secondary | ICD-10-CM

## 2014-10-28 DIAGNOSIS — M159 Polyosteoarthritis, unspecified: Secondary | ICD-10-CM

## 2014-10-28 DIAGNOSIS — M150 Primary generalized (osteo)arthritis: Secondary | ICD-10-CM

## 2014-10-28 DIAGNOSIS — R42 Dizziness and giddiness: Secondary | ICD-10-CM

## 2014-10-28 MED ORDER — OXYCODONE-ACETAMINOPHEN 10-325 MG PO TABS: 1.0000 | ORAL_TABLET | Freq: Four times a day (QID) | ORAL | Status: DC | PRN

## 2014-10-28 MED ORDER — METHYLPREDNISOLONE ACETATE 80 MG/ML IJ SUSP
120.0000 mg | Freq: Once | INTRAMUSCULAR | Status: AC
  Administered 2014-10-28: 120 mg via INTRAMUSCULAR

## 2014-10-28 MED ORDER — CYCLOBENZAPRINE HCL 10 MG PO TABS
10.0000 mg | ORAL_TABLET | Freq: Three times a day (TID) | ORAL | Status: DC | PRN
Start: 2014-10-28 — End: 2015-06-14

## 2014-10-28 MED ORDER — ZOLPIDEM TARTRATE 10 MG PO TABS: 10.0000 mg | ORAL_TABLET | Freq: Every day | ORAL | Status: DC

## 2014-10-28 MED ORDER — METOPROLOL SUCCINATE ER 25 MG PO TB24: 25.0000 mg | ORAL_TABLET | Freq: Every day | ORAL | Status: DC

## 2014-10-28 NOTE — Progress Notes (Signed)
   Subjective:    Patient ID: Melanie Mcdonald, female    DOB: 12/10/1932, 81 y.o.   MRN: [id number]  HPI Here for refills and to discuss her feelings of dizziness, mild headaches , and fatigue. Her glucoses have been stable, and her am fasting glucose yesterday was 120. However her BP has been creeping up for the past few months and today her BP is fairly high. No chest pain or SOB.    Review of Systems  Constitutional: Positive for fatigue.  Respiratory: Negative.   Cardiovascular: Negative.   Neurological: Positive for dizziness and headaches. Negative for tremors, seizures, syncope, facial asymmetry, speech difficulty, weakness, light-headedness and numbness.       Objective:   Physical Exam  Constitutional: She is oriented to person, place, and time. She appears well-developed and well-nourished.  Cardiovascular: Normal rate, regular rhythm, normal heart sounds and intact distal pulses.   Pulmonary/Chest: Effort normal and breath sounds normal.  Neurological: She is alert and oriented to person, place, and time.          Assessment & Plan:  I think she is feeling poorly from the elevated BP so we will start her on low dose Metoprolol succinate. I would hesitate to give her an ACE or an ARB due to her mild renal insufficiency. She is scheduled to see Dr. Meda Coffee next week. meds were refilled

## 2014-10-28 NOTE — Progress Notes (Signed)
Pre visit review using our clinic review tool, if applicable. No additional management support is needed unless otherwise documented below in the visit note. 

## 2014-10-28 NOTE — Addendum Note (Signed)
Addended by: Aggie Hacker A on: 10/28/2014 01:50 PM   Modules accepted: Orders

## 2014-10-29 ENCOUNTER — Telehealth: Payer: Self-pay | Admitting: Family Medicine

## 2014-10-29 NOTE — Telephone Encounter (Signed)
emmi mailed  °

## 2014-10-29 NOTE — Telephone Encounter (Signed)
Pt was seen yesterday and ? Was suppose to get abx for swollen neck glands. [facility] [address]. Sunday Spillers please call pt

## 2014-10-30 MED ORDER — LEVOFLOXACIN 500 MG PO TABS: 500.0000 mg | ORAL_TABLET | Freq: Every day | ORAL | Status: DC

## 2014-10-30 NOTE — Telephone Encounter (Signed)
I sent script e-scribe and spoke with pt. 

## 2014-10-30 NOTE — Telephone Encounter (Signed)
Call in Levaquin 500 mg a day for 10 days

## 2014-11-03 ENCOUNTER — Encounter: Payer: Self-pay | Admitting: Cardiology

## 2014-11-03 ENCOUNTER — Ambulatory Visit (INDEPENDENT_AMBULATORY_CARE_PROVIDER_SITE_OTHER): Payer: Medicare Other | Admitting: Cardiology

## 2014-11-03 VITALS — BP 182/92 | HR 90 | Ht 67.5 in | Wt 245.0 lb

## 2014-11-03 DIAGNOSIS — G4730 Sleep apnea, unspecified: Secondary | ICD-10-CM

## 2014-11-03 DIAGNOSIS — R0600 Dyspnea, unspecified: Secondary | ICD-10-CM

## 2014-11-03 DIAGNOSIS — R0609 Other forms of dyspnea: Secondary | ICD-10-CM

## 2014-11-03 DIAGNOSIS — I5033 Acute on chronic diastolic (congestive) heart failure: Secondary | ICD-10-CM

## 2014-11-03 DIAGNOSIS — I10 Essential (primary) hypertension: Secondary | ICD-10-CM

## 2014-11-03 DIAGNOSIS — G4733 Obstructive sleep apnea (adult) (pediatric): Secondary | ICD-10-CM

## 2014-11-03 HISTORY — DX: Other forms of dyspnea: R06.09

## 2014-11-03 HISTORY — DX: Obstructive sleep apnea (adult) (pediatric): G47.33

## 2014-11-03 HISTORY — DX: Dyspnea, unspecified: R06.00

## 2014-11-03 HISTORY — DX: Acute on chronic diastolic (congestive) heart failure: I50.33

## 2014-11-03 MED ORDER — METOLAZONE 2.5 MG PO TABS: 2.5000 mg | ORAL_TABLET | Freq: Every day | ORAL | Status: DC

## 2014-11-03 MED ORDER — AMLODIPINE BESYLATE 2.5 MG PO TABS: 2.5000 mg | ORAL_TABLET | Freq: Every day | ORAL | Status: DC

## 2014-11-03 MED ORDER — FUROSEMIDE 40 MG PO TABS: 40.0000 mg | ORAL_TABLET | Freq: Two times a day (BID) | ORAL | Status: DC

## 2014-11-03 NOTE — Progress Notes (Signed)
Patient ID: Melanie Mcdonald, female   DOB: 1933/06/09, 81 y.o.   MRN: [id number]    Patient Name: Melanie Mcdonald Date of Encounter: 11/03/2014  Primary Care Provider:  Laurey Morale, MD Primary Cardiologist:  Dorothy Spark  Problem List   Past Medical History  Diagnosis Date  . ANEMIA 01/30/2008  . ARTHRITIS 12/11/2007  . BACK PAIN, CHRONIC 06/23/2008  . BREAST CYST, RIGHT 12/17/2007  . CONTACT DERMATITIS 03/10/2009  . CORONARY ARTERY DISEASE 03/01/2007    had a normal Myoview stress test 07-13-11  . CYSTOCELE WITHOUT MENTION UTERINE PROLAPSE LAT 09/12/2010  . DEGENERATIVE JOINT DISEASE 08/07/2007  . DEPRESSION 03/01/2007  . Esophageal reflux 11/06/2007  . HYPERKERATOSIS 06/02/2009  . HYPERLIPIDEMIA 08/26/2008  . HYPERTENSION 03/01/2007  . Irritable bowel syndrome 01/26/2009  . LABYRINTHITIS 11/03/2009  . Narcolepsy without cataplexy 08/26/2008  . OBESITY 03/01/2007  . PERIPHERAL VASCULAR DISEASE 01/26/2009  . SYNCOPE 08/26/2008    had brain MRI on 06-28-12 showing only chronic microvascular ischemia and atrophy   . TRANSIENT ISCHEMIC ATTACK 10/20/2009    had normal brain MRA with patent vertebrals and carotids 06-28-12  . NEPHROLITHIASIS 04/29/2009  . COPD (chronic obstructive pulmonary disease)   . Colon polyps     FRAGMENTS OF HYPERPLASTIC POLYP  . Diverticulosis of colon (without mention of hemorrhage)   . DIABETES MELLITUS, TYPE II 08/26/2008    sees Dr. Dwyane Dee    Past Surgical History  Procedure Laterality Date  . Cataract extraction    . Abdominal hysterectomy    . Back surgery      x2  . Tonsilectomy, adenoidectomy, bilateral myringotomy and tubes    . Spine surgery      x 3  . Cardiac catheterization  07/2009  . Intraocular lens insertion    . Esophagogastroduodenoscopy  11-13-07    per Dr. Deatra Ina, normal   . Colonoscopy  11-13-07    per Dr. Deatra Ina, benign polyps, repeat in 5 yrs   Allergies  Allergies  Allergen Reactions  . Aspirin Other (See Comments)    Abdominal  pain  . Clarithromycin     Made stomach burn  . Doxycycline Itching  . Erythromycin Nausea And Vomiting  . Penicillins Swelling  . Sulfonamide Derivatives Nausea And Vomiting   HPI  81 year old female who appears to be significantly fatigued and depressed, with prior medical history of hypertension, hyperlipidemia, obesity, narcolepsy, multiple TIAs, prior cardiac catheterization with no intervention and negative stress test in 2012. Patient states that she has been profoundly fatigued and short of breath. 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 Patient appears very tired and in fact falls asleep twice during conversation and appears significantly short of breath just walking a few steps. She hasn't had pulmonary function test or chest CT. She underwent echocardiographic in March of this year that showed preserved left ventricular ejection fraction, no comment about diastolic function, mild mitral stenosis and no comment about right-sided pressures. She states that she always feels dizzy and she has been worked up for bradycardia in the past during surgery day heart monitor but is not sure what was decided based on the results. There is somedate and that her heart rate went as low as 40s and there was consideration about placement of permanent pacemaker. The patient states that she since she last saw Dr.  Gwenlyn Found she has had stable daily dizziness but no syncope. She doesn't want to follow with Dr. Gwenlyn Found. The patient states that she has a history of asthma but she's not using any inhalers.  11/03/2014 - the patient is coming after 6 months she feels the same significant fatigue and shortness of breath with minimal exertion. No syncope or palpitations. No chest pain. She admits that she feels like she's taking a lot of medicines and since she  had syncopal episode after being started multiple medicines she only takes medicines as needed. She complains of significant lower extremity edema.  Home Medications  Prior to Admission medications   Medication Sig Start Date End Date Taking? Authorizing Provider  buPROPion (WELLBUTRIN XL) 150 MG 24 hr tablet Take 1 tablet (150 mg total) by mouth daily. 03/19/14   Laurey Morale, MD  diclofenac sodium (VOLTAREN) 1 % GEL Apply 4 g topically 4 (four) times daily as needed (for arthritis pain in shoulders and fingers). For joint pain. 04/07/13   Laurey Morale, MD  furosemide (LASIX) 40 MG tablet TAKE 1 TABLET BY MOUTH TWICE DAILY 03/17/14   Laurey Morale, MD  Insulin Aspart Prot & Aspart (NOVOLOG MIX 70/30 FLEXPEN) (70-30) 100 UNIT/ML Pen Inject 10 Units into the skin 2 (two) times daily. 02/27/14   Laurey Morale, MD  insulin glargine (LANTUS) 100 unit/mL SOPN Inject 100 Units into the skin at bedtime.    Historical Provider, MD  ketoconazole (NIZORAL) 2 % cream Apply 1 application topically 2 (two) times daily as needed for irritation. 03/19/14   Laurey Morale, MD  meclizine (ANTIVERT) 25 MG tablet Take 25 mg by mouth every 4 (four) hours as needed for dizziness.    Historical Provider, MD  nitroGLYCERIN (NITROSTAT) 0.4 MG SL tablet Place 1 tablet (0.4 mg total) under the tongue every 5 (five) minutes as needed for chest pain. 07/29/13   Laurey Morale, MD  oxyCODONE-acetaminophen (PERCOCET) 10-325 MG per tablet Take 1 tablet by mouth at bedtime. 12/10/13   Laurey Morale, MD  potassium chloride (KLOR-CON 10) 10 MEQ tablet Take 1 tablet (10 mEq total) by mouth daily. 02/27/14   Laurey Morale, MD  pramipexole (MIRAPEX) 1 MG tablet Take 3 mg by mouth at bedtime.    Historical Provider, MD  promethazine (PHENERGAN) 25 MG tablet Take 1 tablet (25 mg total) by mouth every 6 (six) hours as needed for nausea (nausea). For nausea. 05/22/13   Laurey Morale, MD  zolpidem (AMBIEN) 10 MG tablet Take 10 mg by mouth at  bedtime.    Historical Provider, MD    Family History  Family History  Problem Relation Age of Onset  . Lupus Mother   . COPD Father    Social History  History   Social History  . Marital Status: Widowed    Spouse Name: N/A    Number of Children: N/A  . Years of Education: N/A   Occupational History  . Not on file.   Social History Main Topics  . Smoking status: Never Smoker   . Smokeless tobacco: Never Used  . Alcohol Use: No  . Drug Use: No  . Sexual Activity: No   Other Topics Concern  . Not on file   Social History Narrative    Review of Systems, as per HPI, otherwise negative General:  No chills, fever, night sweats or weight changes.  Cardiovascular:  No chest pain, dyspnea on exertion, edema, orthopnea, palpitations, paroxysmal nocturnal  dyspnea. Dermatological: No rash, lesions/masses Respiratory: No cough, dyspnea Urologic: No hematuria, dysuria Abdominal:   No nausea, vomiting, diarrhea, bright red blood per rectum, melena, or hematemesis Neurologic:  No visual changes, wkns, changes in mental status. All other systems reviewed and are otherwise negative except as noted above.  Physical Exam  Blood pressure 182/92, pulse 90, height 5' 7.5" (1.715 m), weight 245 lb (111.131 kg), SpO2 96 %.  General: Pleasant, NAD Psych: Normal affect. Neuro: Alert and oriented X 3. Moves all extremities spontaneously. HEENT: Normal  Neck: Supple without bruits or JVD. Lungs:  Resp regular and unlabored, CTA. Heart: RRR no s3, s4, or murmurs. Abdomen: Soft, non-tender, non-distended, BS + x 4.  Extremities: No clubbing, cyanosis, severe non-pitting B/L. DP/PT/Radials 2+ and equal bilaterally.  Labs:  No results for input(s): CKTOTAL, CKMB, TROPONINI in the last 72 hours. Lab Results  Component Value Date   WBC 8.9 02/23/2014   HGB 13.0 02/23/2014   HCT 38.9 02/23/2014   MCV 88.4 02/23/2014   PLT 212 02/23/2014    Lab Results  Component Value Date   DDIMER  0.50* 02/13/2014   Invalid input(s): POCBNP    Component Value Date/Time   NA 137 02/23/2014 1434   K 4.7 02/23/2014 1434   CL 98 02/23/2014 1434   CO2 27 02/23/2014 1434   GLUCOSE 426* 02/23/2014 1434   BUN 22 02/23/2014 1434   CREATININE 0.74 02/23/2014 1434   CALCIUM 9.3 02/23/2014 1434   PROT 7.5 02/23/2014 1434   ALBUMIN 3.6 02/23/2014 1434   AST 19 02/23/2014 1434   ALT 18 02/23/2014 1434   ALKPHOS 104 02/23/2014 1434   BILITOT 0.3 02/23/2014 1434   GFRNONAA 79* 02/23/2014 1434   GFRAA >90 02/23/2014 1434   Lab Results  Component Value Date   CHOL 134 01/15/2012   HDL 60.30 01/15/2012   LDLCALC 57 01/15/2012   TRIG 83.0 01/15/2012   Accessory Clinical Findings  Echocardiogram - 12/16/2013  - Left ventricle: The cavity size was normal. Wall thickness was increased in a pattern of severe LVH. Systolic function was normal. The estimated ejection fraction was in the range of 55% to 60%. - Mitral valve: Calcified annulus. Moderately calcified leaflets . The findings are consistent with mild stenosis. - Left atrium: The atrium was moderately dilated. - Atrial septum: No defect or patent foramen ovale was identified.  ECG - sinus rhythm  Lexiscan nuclear stress test: Quantitative Gated Spect Images QGS EDV: 87 ml QGS ESV: 34 ml  Impression Exercise Capacity: Lexiscan with no exercise. BP Response: Hypotensive blood pressure response. Clinical Symptoms: No significant symptoms noted. ECG Impression: No significant ECG changes with Lexiscan. Comparison with Prior Nuclear Study: No images to compare  Overall Impression: Low risk stress nuclear study with small area of fixed inferobasal bowel attenuation artifact. No reversible ischemia.  LV Ejection Fraction: 61%. LV Wall Motion: NL LV Function; NL Wall Motion  Pixie Casino, MD, [facility] Board Certified in Nuclear Cardiology Attending Cardiologist [place]  81 year  old female with poly morbidities  1. Severe SOB - sec to uncontrolled HTN -  Lexiscan nuclear stress test was negative - CT/CTA chest to assess for parenchymal lung disease and chronic thromboembolic disease that were ruled out.There was no significant finding on her chest CT but finding of thyroid nodule, that was biopsied and showed benign nodule. - started Advair 250/50 BID and albuterol PRN, however normal pulmonary function test was normal  2. Hypertension with severe concentric LVH on echocardiogram, today's blood pressure serially uncontrolled and patient noncompliant to her medicines. I encouraged to take Toprol 25 mg daily, and will at amlodipine 2.5 mg daily.  3. Acute and chronic diastolic CHF - increase Lasix to 40 mg twice a day and add metolazone 30 minutes prior to first dose of Lasix in the morning.  4. Obstructive apnea - and narcolepsy seems majority of symptoms can be related to the obstructive sleep apnea, we will order a sleep study.  We will follow the patient in one month and we'll check BMPs.   Dorothy Spark, MD, [facility] 11/03/2014, 3:04 PM

## 2014-11-03 NOTE — Patient Instructions (Signed)
Your physician has recommended you make the following change in your medication:   CONTINUE TAKING METOPROLOL 25 MG ONCE DAILY  TAKE LASIX 40 MG (1 IN THE MORNING) AND (1 IN THE EVENING) TO TOTAL= 80 MG A DAY  TAKE METOLAZONE 2.5 MG TAKE THIS 30 MINUTES PRIOR TO YOUR MORNING DOSE OF LASIX  START TAKING AMLODIPINE 2.5 MG ONCE DAILY  Your physician has recommended that you have a sleep study. This test records several body functions during sleep, including: brain activity, eye movement, oxygen and carbon dioxide blood levels, heart rate and rhythm, breathing rate and rhythm, the flow of air through your mouth and nose, snoring, body muscle movements, and chest and belly movement.    Your physician recommends that you return for lab work in: [facility] TO YOUR ONE MONTH OFFICE VISIT WITH DR Meda Coffee  Your physician recommends that you schedule a follow-up appointment in: [place]

## 2014-11-10 ENCOUNTER — Encounter (HOSPITAL_COMMUNITY): Payer: Self-pay | Admitting: *Deleted

## 2014-11-10 ENCOUNTER — Emergency Department (HOSPITAL_COMMUNITY): Payer: Medicare Other

## 2014-11-10 ENCOUNTER — Emergency Department (HOSPITAL_COMMUNITY)
Admission: EM | Admit: 2014-11-10 | Discharge: 2014-11-10 | Disposition: A | Discharge status: Home / Self Care | Payer: Medicare Other | Attending: Emergency Medicine | Admitting: Emergency Medicine

## 2014-11-10 DIAGNOSIS — M1990 Unspecified osteoarthritis, unspecified site: Secondary | ICD-10-CM | POA: Insufficient documentation

## 2014-11-10 DIAGNOSIS — Z792 Long term (current) use of antibiotics: Secondary | ICD-10-CM | POA: Insufficient documentation

## 2014-11-10 DIAGNOSIS — J449 Chronic obstructive pulmonary disease, unspecified: Secondary | ICD-10-CM | POA: Diagnosis not present

## 2014-11-10 DIAGNOSIS — I10 Essential (primary) hypertension: Secondary | ICD-10-CM | POA: Insufficient documentation

## 2014-11-10 DIAGNOSIS — Z87442 Personal history of urinary calculi: Secondary | ICD-10-CM | POA: Insufficient documentation

## 2014-11-10 DIAGNOSIS — Z794 Long term (current) use of insulin: Secondary | ICD-10-CM | POA: Diagnosis not present

## 2014-11-10 DIAGNOSIS — Z86010 Personal history of colonic polyps: Secondary | ICD-10-CM | POA: Insufficient documentation

## 2014-11-10 DIAGNOSIS — Z79899 Other long term (current) drug therapy: Secondary | ICD-10-CM | POA: Insufficient documentation

## 2014-11-10 DIAGNOSIS — Z862 Personal history of diseases of the blood and blood-forming organs and certain disorders involving the immune mechanism: Secondary | ICD-10-CM | POA: Insufficient documentation

## 2014-11-10 DIAGNOSIS — Z872 Personal history of diseases of the skin and subcutaneous tissue: Secondary | ICD-10-CM | POA: Insufficient documentation

## 2014-11-10 DIAGNOSIS — Z91119 Patient's noncompliance with dietary regimen due to unspecified reason: Secondary | ICD-10-CM

## 2014-11-10 DIAGNOSIS — Z7951 Long term (current) use of inhaled steroids: Secondary | ICD-10-CM | POA: Diagnosis not present

## 2014-11-10 DIAGNOSIS — Z8742 Personal history of other diseases of the female genital tract: Secondary | ICD-10-CM | POA: Diagnosis not present

## 2014-11-10 DIAGNOSIS — E669 Obesity, unspecified: Secondary | ICD-10-CM | POA: Insufficient documentation

## 2014-11-10 DIAGNOSIS — Z880 Allergy status to penicillin: Secondary | ICD-10-CM | POA: Insufficient documentation

## 2014-11-10 DIAGNOSIS — I2510 Atherosclerotic heart disease of native coronary artery without angina pectoris: Secondary | ICD-10-CM | POA: Insufficient documentation

## 2014-11-10 DIAGNOSIS — Z8719 Personal history of other diseases of the digestive system: Secondary | ICD-10-CM | POA: Diagnosis not present

## 2014-11-10 DIAGNOSIS — Z9111 Patient's noncompliance with dietary regimen: Secondary | ICD-10-CM | POA: Diagnosis not present

## 2014-11-10 DIAGNOSIS — R4701 Aphasia: Secondary | ICD-10-CM | POA: Diagnosis present

## 2014-11-10 DIAGNOSIS — G459 Transient cerebral ischemic attack, unspecified: Secondary | ICD-10-CM | POA: Diagnosis not present

## 2014-11-10 DIAGNOSIS — E1165 Type 2 diabetes mellitus with hyperglycemia: Secondary | ICD-10-CM | POA: Diagnosis not present

## 2014-11-10 DIAGNOSIS — F329 Major depressive disorder, single episode, unspecified: Secondary | ICD-10-CM | POA: Diagnosis not present

## 2014-11-10 LAB — URINALYSIS, ROUTINE W REFLEX MICROSCOPIC
BILIRUBIN URINE: NEGATIVE
Glucose, UA: >1000 mg/dL — AB
HGB URINE DIPSTICK: NEGATIVE
Ketones, ur: NEGATIVE mg/dL
Leukocytes, UA: NEGATIVE
Nitrite: NEGATIVE
Protein, ur: NEGATIVE mg/dL
Specific Gravity, Urine: 1.03 (ref 1.005–1.030)
UROBILINOGEN UA: 0.2 mg/dL (ref 0.0–1.0)
pH: 5.5 (ref 5.0–8.0)

## 2014-11-10 LAB — COMPREHENSIVE METABOLIC PANEL
ALT: 19 U/L (ref 0–35)
ANION GAP: 10 (ref 5–15)
AST: 22 U/L (ref 0–37)
Albumin: 4 g/dL (ref 3.5–5.2)
Alkaline Phosphatase: 114 U/L (ref 39–117)
BILIRUBIN TOTAL: 0.8 mg/dL (ref 0.3–1.2)
BUN: 45 mg/dL — AB (ref 6–23)
CO2: 33 mmol/L — ABNORMAL HIGH (ref 19–32)
CREATININE: 1.21 mg/dL — AB (ref 0.50–1.10)
Calcium: 9.2 mg/dL (ref 8.4–10.5)
Chloride: 88 mmol/L — ABNORMAL LOW (ref 96–112)
GFR, EST AFRICAN AMERICAN: 48 mL/min — AB (ref >90)
GFR, EST NON AFRICAN AMERICAN: 41 mL/min — AB (ref >90)
GLUCOSE: 575 mg/dL — AB (ref 70–99)
POTASSIUM: 3.6 mmol/L (ref 3.5–5.1)
Sodium: 131 mmol/L — ABNORMAL LOW (ref 135–145)
TOTAL PROTEIN: 8.2 g/dL (ref 6.0–8.3)

## 2014-11-10 LAB — CBC WITH DIFFERENTIAL/PLATELET
Basophils Absolute: 0 10*3/uL (ref 0.0–0.1)
Basophils Relative: 0 % (ref 0–1)
Eosinophils Absolute: 0.2 10*3/uL (ref 0.0–0.7)
Eosinophils Relative: 2 % (ref 0–5)
HCT: 40 % (ref 36.0–46.0)
HEMOGLOBIN: 13.3 g/dL (ref 12.0–15.0)
LYMPHS ABS: 3.3 10*3/uL (ref 0.7–4.0)
Lymphocytes Relative: 34 % (ref 12–46)
MCH: 28.9 pg (ref 26.0–34.0)
MCHC: 33.3 g/dL (ref 30.0–36.0)
MCV: 87 fL (ref 78.0–100.0)
MONOS PCT: 7 % (ref 3–12)
Monocytes Absolute: 0.7 10*3/uL (ref 0.1–1.0)
NEUTROS ABS: 5.5 10*3/uL (ref 1.7–7.7)
NEUTROS PCT: 57 % (ref 43–77)
Platelets: 222 10*3/uL (ref 150–400)
RBC: 4.6 MIL/uL (ref 3.87–5.11)
RDW: 13.3 % (ref 11.5–15.5)
WBC: 9.6 10*3/uL (ref 4.0–10.5)

## 2014-11-10 LAB — TROPONIN I: Troponin I: <0.03 ng/mL (ref <0.031)

## 2014-11-10 LAB — CBG MONITORING, ED: Glucose-Capillary: 392 mg/dL — ABNORMAL HIGH (ref 70–99)

## 2014-11-10 LAB — URINE MICROSCOPIC-ADD ON

## 2014-11-10 LAB — BRAIN NATRIURETIC PEPTIDE: B Natriuretic Peptide: 56.2 pg/mL (ref 0.0–100.0)

## 2014-11-10 MED ORDER — INSULIN ASPART 100 UNIT/ML ~~LOC~~ SOLN
14.0000 [IU] | Freq: Once | SUBCUTANEOUS | Status: AC
  Administered 2014-11-10: 14 [IU] via SUBCUTANEOUS
  Filled 2014-11-10: qty 1

## 2014-11-10 NOTE — Progress Notes (Signed)
  CARE MANAGEMENT ED NOTE 11/10/2014  Patient:  Melanie Mcdonald, Melanie Mcdonald   Account Number:  [id number]  Date Initiated:  11/10/2014  Documentation initiated by:  Livia Snellen  Subjective/Objective Assessment:   Patient presents to Ed with weakness in legs, reports a fall, leg swelling, difficulty getting words out headache and dizziness.     Subjective/Objective Assessment Detail:     Action/Plan:   Action/Plan Detail:   Anticipated DC Date:  11/10/2014     Status Recommendation to Physician:   Result of Recommendation:    Other ED Services  Consult [facility] [place]  Other    Choice offered to / List presented to:            Status of service:  Completed, signed off  ED Comments:   ED Comments Detail:  [facility] consulted by EDSW reporting that patient would be interested in physical therapy at home, however, when [facility] went to speak to patient, patient was discharged.  EDCM called patient at home at 2033pm.  Patient is agreeable to have [facility] contact patient's pcp Dr. Sarajane Jews for home health services.  Patient reports she has seen Dr, Sarajane Jews a few weeks ago.  Patient also reports she gave her rolator walker to a friend of hers.  Patinet reports, "I really don't want a walker,but even my son says I need to get myself a walker." Patient reports her son is with her during the week but goes away on the weekends.  [facility] will contact office of Dr. Sarajane Jews for home health services and walker.  Patient very thankful for assistance.  No further EDCM needs at this time.

## 2014-11-10 NOTE — ED Provider Notes (Signed)
CSN: [id number]     Arrival date & time 11/10/14  1129 History   First MD Initiated Contact with Patient 11/10/14 1139     Chief Complaint  Patient presents with  . Headache  . Aphasia     (Consider location/radiation/quality/duration/timing/severity/associated sxs/prior Treatment) HPI The patient reports symptoms onset yesterday. She was having a lot of difficulty saying what she wanted to say. She reports she knew what she meant that she couldn't get out the words. He her family member here with her reports that she had talked to her yesterday in the words were all just a "jumble". The patient also reports she's had some mild diffuse headache in association with this. These symptoms of word finding difficulty have been coming and going now since yesterday. The patient reports that she had the same symptoms when she had a TIA in the past. She reports she does feel generally weak in the legs but does not have any focal extremity dysfunction. She also notes that she has a history of congestive heart failure and has been feeling increasingly short of breath recently and noticed increased swelling in her legs. She reports she took 80 mg of Lasix this morning. Past Medical History  Diagnosis Date  . ANEMIA 01/30/2008  . ARTHRITIS 12/11/2007  . BACK PAIN, CHRONIC 06/23/2008  . BREAST CYST, RIGHT 12/17/2007  . CONTACT DERMATITIS 03/10/2009  . CORONARY ARTERY DISEASE 03/01/2007    had a normal Myoview stress test 07-13-11  . CYSTOCELE WITHOUT MENTION UTERINE PROLAPSE LAT 09/12/2010  . DEGENERATIVE JOINT DISEASE 08/07/2007  . DEPRESSION 03/01/2007  . Esophageal reflux 11/06/2007  . HYPERKERATOSIS 06/02/2009  . HYPERLIPIDEMIA 08/26/2008  . HYPERTENSION 03/01/2007  . Irritable bowel syndrome 01/26/2009  . LABYRINTHITIS 11/03/2009  . Narcolepsy without cataplexy 08/26/2008  . OBESITY 03/01/2007  . PERIPHERAL VASCULAR DISEASE 01/26/2009  . SYNCOPE 08/26/2008    had brain MRI on 06-28-12 showing only chronic  microvascular ischemia and atrophy   . TRANSIENT ISCHEMIC ATTACK 10/20/2009    had normal brain MRA with patent vertebrals and carotids 06-28-12  . NEPHROLITHIASIS 04/29/2009  . COPD (chronic obstructive pulmonary disease)   . Colon polyps     FRAGMENTS OF HYPERPLASTIC POLYP  . Diverticulosis of colon (without mention of hemorrhage)   . DIABETES MELLITUS, TYPE II 08/26/2008    sees Dr. Dwyane Dee    Past Surgical History  Procedure Laterality Date  . Cataract extraction    . Abdominal hysterectomy    . Back surgery      x2  . Tonsilectomy, adenoidectomy, bilateral myringotomy and tubes    . Spine surgery      x 3  . Cardiac catheterization  07/2009  . Intraocular lens insertion    . Esophagogastroduodenoscopy  11-13-07    per Dr. Deatra Ina, normal   . Colonoscopy  11-13-07    per Dr. Deatra Ina, benign polyps, repeat in 5 yrs   Family History  Problem Relation Age of Onset  . Lupus Mother   . COPD Father    History  Substance Use Topics  . Smoking status: Never Smoker   . Smokeless tobacco: Never Used  . Alcohol Use: No   OB History    No data available     Review of Systems  10 Systems reviewed and are negative for acute change except as noted in the HPI.   Allergies  Aspirin; Clarithromycin; Doxycycline; Erythromycin; Other; Penicillins; and Sulfonamide derivatives  Home Medications   Prior to Admission medications  Medication Sig Start Date End Date Taking? Authorizing Provider  ACCU-CHEK AVIVA PLUS test strip  03/19/14  Yes Historical Provider, MD  albuterol (PROVENTIL HFA;VENTOLIN HFA) 108 (90 BASE) MCG/ACT inhaler Inhale 2 puffs into the lungs every 4 (four) hours as needed for wheezing or shortness of breath. 04/01/14  Yes Dorothy Spark, MD  amLODipine (NORVASC) 2.5 MG tablet Take 1 tablet (2.5 mg total) by mouth daily. 11/03/14  Yes Dorothy Spark, MD  buPROPion (WELLBUTRIN XL) 150 MG 24 hr tablet Take 1 tablet (150 mg total) by mouth daily. 09/07/14  Yes Laurey Morale, MD  cyclobenzaprine (FLEXERIL) 10 MG tablet Take 1 tablet (10 mg total) by mouth 3 (three) times daily as needed for muscle spasms. 10/28/14  Yes Laurey Morale, MD  diclofenac sodium (VOLTAREN) 1 % GEL Apply 4 g topically 4 (four) times daily as needed (for arthritis pain in shoulders and fingers). For joint pain. 04/07/13  Yes Laurey Morale, MD  Fluticasone-Salmeterol (ADVAIR DISKUS) 250-50 MCG/DOSE AEPB Inhale 1 puff into the lungs 2 (two) times daily. 04/01/14  Yes Dorothy Spark, MD  furosemide (LASIX) 40 MG tablet Take 1 tablet (40 mg total) by mouth 2 (two) times daily. TAKE 1 IN THE MORNING AND ONE IN THE EVENING 11/03/14  Yes Dorothy Spark, MD  HYDROcodone-homatropine (HYDROMET) 5-1.5 MG/5ML syrup Take 5 mLs by mouth every 4 (four) hours as needed. Patient taking differently: Take 5 mLs by mouth every 4 (four) hours as needed for cough.  09/07/14  Yes Laurey Morale, MD  Insulin Aspart Prot & Aspart (NOVOLOG 70/30 MIX) (70-30) 100 UNIT/ML Pen Take 25 units with breakfast, 15 units with lunch and 25 units with dinner 06/03/14  Yes Laurey Morale, MD  ketoconazole (NIZORAL) 2 % cream Apply 1 application topically 2 (two) times daily as needed for irritation. 06/26/14  Yes Eulas Post, MD  LANTUS SOLOSTAR 100 UNIT/ML Solostar Pen INJECT 100 UNITS UNDER THE SKIN AT BEDTIME. 07/07/14  Yes Laurey Morale, MD  levofloxacin (LEVAQUIN) 500 MG tablet Take 1 tablet (500 mg total) by mouth daily. 10/30/14  Yes Laurey Morale, MD  meclizine (ANTIVERT) 25 MG tablet Take 1 tablet (25 mg total) by mouth every 4 (four) hours as needed for dizziness. 05/14/14  Yes Laurey Morale, MD  metFORMIN (GLUCOPHAGE-XR) 500 MG 24 hr tablet Take 1 tablet twice daily 04/28/14  Yes Elayne Snare, MD  metolazone (ZAROXOLYN) 2.5 MG tablet Take 1 tablet (2.5 mg total) by mouth daily. TAKE THIS 30 MINUTES PRIOR TO YOUR MORNING DOSE OF LASIX 11/03/14  Yes Dorothy Spark, MD  metoprolol succinate (TOPROL-XL) 25 MG 24 hr tablet Take 1  tablet (25 mg total) by mouth daily. 10/28/14  Yes Laurey Morale, MD  nitroGLYCERIN (NITROSTAT) 0.4 MG SL tablet Place 1 tablet (0.4 mg total) under the tongue every 5 (five) minutes as needed for chest pain. 07/29/13  Yes Laurey Morale, MD  oxyCODONE-acetaminophen (PERCOCET) 10-325 MG per tablet Take 1 tablet by mouth every 6 (six) hours as needed for pain. 10/28/14  Yes Laurey Morale, MD  phenazopyridine (PYRIDIUM) 200 MG tablet Take 200 mg by mouth 3 (three) times daily as needed for pain.   Yes Historical Provider, MD  potassium chloride (KLOR-CON 10) 10 MEQ tablet Take 1 tablet (10 mEq total) by mouth daily. 05/14/14  Yes Laurey Morale, MD  pramipexole (MIRAPEX) 1 MG tablet Take 6 tablets (6 mg total) by  mouth daily. 07/14/14  Yes Laurey Morale, MD  promethazine (PHENERGAN) 25 MG tablet Take 1 tablet (25 mg total) by mouth every 6 (six) hours as needed for nausea (nausea). For nausea. 05/14/14  Yes Laurey Morale, MD  zolpidem (AMBIEN) 10 MG tablet Take 1 tablet (10 mg total) by mouth at bedtime. 10/28/14  Yes Laurey Morale, MD   BP 134/72 mmHg  Pulse 82  Temp(Src) 98.1 F (36.7 C) (Oral)  Resp 16  SpO2 96% Physical Exam  Constitutional: She is oriented to person, place, and time.  The patient is moderately obese and deconditioned. She is alert and nontoxic without acute respiratory distress. Color is good and mental status is clear.  HENT:  Head: Normocephalic and atraumatic.  Eyes: EOM are normal. Pupils are equal, round, and reactive to light.  Neck: Neck supple.  Cardiovascular: Normal rate, regular rhythm, normal heart sounds and intact distal pulses.   Occasional ectopy.  Pulmonary/Chest: Effort normal and breath sounds normal.  Abdominal: Soft. Bowel sounds are normal. She exhibits no distension. There is no tenderness.  Musculoskeletal: Normal range of motion. She exhibits edema. She exhibits no tenderness.  Patient has 1+ pitting edema bilateral lower extremities. No calf  tenderness. No active wounds or ulcerations  Neurological: She is alert and oriented to person, place, and time. She has normal strength. No cranial nerve deficit. She exhibits normal muscle tone. Coordination normal. GCS eye subscore is 4. GCS verbal subscore is 5. GCS motor subscore is 6.  The patient can ambulate at her baseline with her cane. Currently her word finding is appropriate and she is able to give history without obvious aphasia..  Skin: Skin is warm, dry and intact.  Psychiatric: She has a normal mood and affect.    ED Course  Procedures (including critical care time) Labs Review Labs Reviewed  COMPREHENSIVE METABOLIC PANEL - Abnormal; Notable for the following:    Sodium 131 (*)    Chloride 88 (*)    CO2 33 (*)    Glucose, Bld 575 (*)    BUN 45 (*)    Creatinine, Ser 1.21 (*)    GFR calc non Af Amer 41 (*)    GFR calc Af Amer 48 (*)    All other components within normal limits  URINALYSIS, ROUTINE W REFLEX MICROSCOPIC - Abnormal; Notable for the following:    Glucose, UA >1000 (*)    All other components within normal limits  CBG MONITORING, ED - Abnormal; Notable for the following:    Glucose-Capillary 392 (*)    All other components within normal limits  BRAIN NATRIURETIC PEPTIDE  TROPONIN I  CBC WITH DIFFERENTIAL/PLATELET  URINE MICROSCOPIC-ADD ON    Imaging Review Dg Chest 2 View  11/10/2014   CLINICAL DATA:  81 year old female with altered mental status, headache, shortness of breath, cough, weakness. Initial encounter.  EXAM: CHEST  2 VIEW  COMPARISON:  Chest CTA 04/01/2014 and earlier.  FINDINGS: Normal cardiac size and mediastinal contours. Visualized tracheal air column is within normal limits. Stable lung volumes. No pneumothorax, pulmonary edema, pleural effusion or acute pulmonary opacity. No acute osseous abnormality identified. Calcified atherosclerosis of the aorta.  IMPRESSION: No acute cardiopulmonary abnormality.   Electronically Signed   By: Genevie Ann M.D.   On: 11/10/2014 13:10     EKG Interpretation   Date/Time:  Tuesday November 10 2014 11:38:05 EST Ventricular Rate:  95 PR Interval:  183 QRS Duration: 93 QT Interval:  378 QTC  Calculation: 475 R Axis:   -17 Text Interpretation:  Sinus rhythm Inferior infarct, old Consider anterior  infarct no STEMI. NO CHANGE FROM PRIOR Confirmed by Johnney Killian, MD, Jeannie Done  [phone]) on 11/10/2014 3:53:46 PM      MDM   Final diagnoses:  Transient cerebral ischemia, unspecified transient cerebral ischemia type  Hyperglycemia due to type 2 diabetes mellitus  Dietary noncompliance   The patient's chief complaint is an episode of expressive aphasia occurring yesterday. Of note at the time the patient was also experiencing a very stressful situation. Today there is no evidence of neurologic deficit or cognitive dysfunction. The patient has prior diagnosis of possible TIA although her MRIs previously did not show areas of stroke or old ischemia. The patient has been noncompliant with diabetes management due to a combination of dietary indiscretion and not using daily sliding scale insulin with food intake. She reports she is taking the nighttime insulin as prescribed. At this time the patient is not showing signs of acute infectious illness or acidotic hyperosmolar state. She is counseled on compliance with diabetes management and monitoring her food intake so that her family physician can appropriately adjust her insulin regimen if needed. The patient is to follow-up with her family physician this week to continue outpatient management, dietary counseling and further stroke workup if indicated although this has been done previously within the past year and a half. The patient has been counseled on the importance of risk factor management and presenting to the emergency department immediately upon onset of strokelike symptoms.    Charlesetta Shanks, MD 11/11/14 [phone]

## 2014-11-10 NOTE — Progress Notes (Signed)
CSW met with patient at bedside. Son was present. Patient informed CSW that she presents to [facility] due to not being able to communicate verbally today. Pt states that although she wanted to speak and was thinking the words, her words would come out garbled. Patient states that this happened yesterday, this morning, and once this evening.   Pt informed CSW that she lives at home in [place]. Pt states that her son stays with her during the weekdays. However, he does not stay during the weekends.  Patient says that she loses balance often, and that she has weak legs. Also, patient states her legs are swollen.   Patient expressed to CSW that she is not interested in facilities, but would like therapy at home.  [name] Nestor/Son [phone]  Willette Brace [phone] ED CSW 11/10/2014 5:06 PM

## 2014-11-10 NOTE — ED Notes (Signed)
Pt reports yesterday around 1900 began to have difficulty getting words out, headache, and dizziness. Hx of TIA. Grips equal, no arm drift, no slurred speech, no facial droop.

## 2014-11-10 NOTE — Discharge Instructions (Signed)
Transient Ischemic Attack A transient ischemic attack (TIA) is a "warning stroke" that causes stroke-like symptoms. Unlike a stroke, a TIA does not cause permanent damage to the brain. The symptoms of a TIA can happen very fast and do not last long. It is important to know the symptoms of a TIA and what to do. This can help prevent a major stroke or death. CAUSES   A TIA is caused by a temporary blockage in an artery in the brain or neck (carotid artery). The blockage does not allow the brain to get the blood supply it needs and can cause different symptoms. The blockage can be caused by either:  A blood clot.  Fatty buildup (plaque) in a neck or brain artery. RISK FACTORS  High blood pressure (hypertension).  High cholesterol.  Diabetes mellitus.  Heart disease.  The build up of plaque in the blood vessels (peripheral artery disease or atherosclerosis).  The build up of plaque in the blood vessels providing blood and oxygen to the brain (carotid artery stenosis).  An abnormal heart rhythm (atrial fibrillation).  Obesity.  Smoking.  Taking oral contraceptives (especially in combination with smoking).  Physical inactivity.  A diet high in fats, salt (sodium), and calories.  Alcohol use.  Use of illegal drugs (especially cocaine and methamphetamine).  Being female.  Being African American.  Being over the age of 67.  Family history of stroke.  Previous history of blood clots, stroke, TIA, or heart attack.  Sickle cell disease. SYMPTOMS  TIA symptoms are the same as a stroke but are temporary. These symptoms usually develop suddenly, or may be newly present upon awakening from sleep:  Sudden weakness or numbness of the face, arm, or leg, especially on one side of the body.  Sudden trouble walking or difficulty moving arms or legs.  Sudden confusion.  Sudden personality changes.  Trouble speaking (aphasia) or understanding.  Difficulty swallowing.  Sudden  trouble seeing in one or both eyes.  Double vision.  Dizziness.  Loss of balance or coordination.  Sudden severe headache with no known cause.  Trouble reading or writing.  Loss of bowel or bladder control.  Loss of consciousness. DIAGNOSIS  Your caregiver may be able to determine the presence or absence of a TIA based on your symptoms, history, and physical exam. Computed tomography (CT scan) of the brain is usually performed to help identify a TIA. Other tests may be done to diagnose a TIA. These tests may include:  Electrocardiography.  Continuous heart monitoring.  Echocardiography.  Carotid ultrasonography.  Magnetic resonance imaging (MRI).  A scan of the brain circulation.  Blood tests. PREVENTION  The risk of a TIA can be decreased by appropriately treating high blood pressure, high cholesterol, diabetes, heart disease, and obesity and by quitting smoking, limiting alcohol, and staying physically active. TREATMENT  Time is of the essence. Since the symptoms of TIA are the same as a stroke, it is important to seek treatment as soon as possible because you may need a medicine to dissolve the clot (thrombolytic) that cannot be given if too much time has passed. Treatment options vary. Treatment options may include rest, oxygen, intravenous (IV) fluids, and medicines to thin the blood (anticoagulants). Medicines and diet may be used to address diabetes, high blood pressure, and other risk factors. Measures will be taken to prevent short-term and long-term complications, including infection from breathing foreign material into the lungs (aspiration pneumonia), blood clots in the legs, and falls. Treatment options include procedures  to either remove plaque in the carotid arteries or dilate carotid arteries that have narrowed due to plaque. Those procedures are:  Carotid endarterectomy.  Carotid angioplasty and stenting. HOME CARE INSTRUCTIONS   Take all medicines prescribed  by your caregiver. Follow the directions carefully. Medicines may be used to control risk factors for a stroke. Be sure you understand all your medicine instructions.  You may be told to take aspirin or the anticoagulant warfarin. Warfarin needs to be taken exactly as instructed.  Taking too much or too little warfarin is dangerous. Too much warfarin increases the risk of bleeding. Too little warfarin continues to allow the risk for blood clots. While taking warfarin, you will need to have regular blood tests to measure your blood clotting time. A PT blood test measures how long it takes for blood to clot. Your PT is used to calculate another value called an INR. Your PT and INR help your caregiver to adjust your dose of warfarin. The dose can change for many reasons. It is critically important that you take warfarin exactly as prescribed.  Many foods, especially foods high in vitamin K can interfere with warfarin and affect the PT and INR. Foods high in vitamin K include spinach, kale, broccoli, cabbage, collard and turnip greens, brussels sprouts, peas, cauliflower, seaweed, and parsley as well as beef and pork liver, green tea, and soybean oil. You should eat a consistent amount of foods high in vitamin K. Avoid major changes in your diet, or notify your caregiver before changing your diet. Arrange a visit with a dietitian to answer your questions.  Many medicines can interfere with warfarin and affect the PT and INR. You must tell your caregiver about any and all medicines you take, this includes all vitamins and supplements. Be especially cautious with aspirin and anti-inflammatory medicines. Do not take or discontinue any prescribed or over-the-counter medicine except on the advice of your caregiver or pharmacist.  Warfarin can have side effects, such as excessive bruising or bleeding. You will need to hold pressure over cuts for longer than usual. Your caregiver or pharmacist will discuss other  potential side effects.  Avoid sports or activities that may cause injury or bleeding.  Be mindful when shaving, flossing your teeth, or handling sharp objects.  Alcohol can change the body's ability to handle warfarin. It is best to avoid alcoholic drinks or consume only very small amounts while taking warfarin. Notify your caregiver if you change your alcohol intake.  Notify your dentist or other caregivers before procedures.  Eat a diet that includes 5 or more servings of fruits and vegetables each day. This may reduce the risk of stroke. Certain diets may be prescribed to address high blood pressure, high cholesterol, diabetes, or obesity.  A low-sodium, low-saturated fat, low-trans fat, low-cholesterol diet is recommended to manage high blood pressure.  A low-saturated fat, low-trans fat, low-cholesterol, and high-fiber diet may control cholesterol levels.  A controlled-carbohydrate, controlled-sugar diet is recommended to manage diabetes.  A reduced-calorie, low-sodium, low-saturated fat, low-trans fat, low-cholesterol diet is recommended to manage obesity.  Maintain a healthy weight.  Stay physically active. It is recommended that you get at least 30 minutes of activity on most or all days.  Do not smoke.  Limit alcohol use even if you are not taking warfarin. Moderate alcohol use is considered to be:  No more than 2 drinks each day for men.  No more than 1 drink each day for nonpregnant women.  Stop drug abuse.  Home safety. A safe home environment is important to reduce the risk of falls. Your caregiver may arrange for specialists to evaluate your home. Having grab bars in the bedroom and bathroom is often important. Your caregiver may arrange for equipment to be used at home, such as raised toilets and a seat for the shower.  Follow all instructions for follow-up with your caregiver. This is very important. This includes any referrals and lab tests. Proper follow up can  prevent a stroke or another TIA from occurring. SEEK MEDICAL CARE IF:  You have personality changes.  You have difficulty swallowing.  You are seeing double.  You have dizziness.  You have a fever.  You have skin breakdown. SEEK IMMEDIATE MEDICAL CARE IF:  Any of these symptoms may represent a serious problem that is an emergency. Do not wait to see if the symptoms will go away. Get medical help right away. Call your local emergency services (911 in [place] Do not drive yourself to the hospital.  You have sudden weakness or numbness of the face, arm, or leg, especially on one side of the body.  You have sudden trouble walking or difficulty moving arms or legs.  You have sudden confusion.  You have trouble speaking (aphasia) or understanding.  You have sudden trouble seeing in one or both eyes.  You have a loss of balance or coordination.  You have a sudden, severe headache with no known cause.  You have new chest pain or an irregular heartbeat.  You have a partial or total loss of consciousness. MAKE SURE YOU:   Understand these instructions.  Will watch your condition.  Will get help right away if you are not doing well or get worse. Document Released: 06/28/2005 Document Revised: 09/23/2013 Document Reviewed: 12/24/2013 [facility] Patient Information 2015 [place], [place]. This information is not intended to replace advice given to you by your health care provider. Make sure you discuss any questions you have with your health care provider. Diabetes Mellitus and Food It is important for you to manage your blood sugar (glucose) level. Your blood glucose level can be greatly affected by what you eat. Eating healthier foods in the appropriate amounts throughout the day at about the same time each day will help you control your blood glucose level. It can also help slow or prevent worsening of your diabetes mellitus. Healthy eating may even help you improve the level of your  blood pressure and reach or maintain a healthy weight.  HOW CAN FOOD AFFECT ME? Carbohydrates Carbohydrates affect your blood glucose level more than any other type of food. Your dietitian will help you determine how many carbohydrates to eat at each meal and teach you how to count carbohydrates. Counting carbohydrates is important to keep your blood glucose at a healthy level, especially if you are using insulin or taking certain medicines for diabetes mellitus. Alcohol Alcohol can cause sudden decreases in blood glucose (hypoglycemia), especially if you use insulin or take certain medicines for diabetes mellitus. Hypoglycemia can be a life-threatening condition. Symptoms of hypoglycemia (sleepiness, dizziness, and disorientation) are similar to symptoms of having too much alcohol.  If your health care provider has given you approval to drink alcohol, do so in moderation and use the following guidelines:  Women should not have more than one drink per day, and men should not have more than two drinks per day. One drink is equal to:  12 oz of beer.  5 oz of wine.  1 oz of  hard liquor.  Do not drink on an empty stomach.  Keep yourself hydrated. Have water, diet soda, or unsweetened iced tea.  Regular soda, juice, and other mixers might contain a lot of carbohydrates and should be counted. WHAT FOODS ARE NOT RECOMMENDED? As you make food choices, it is important to remember that all foods are not the same. Some foods have fewer nutrients per serving than other foods, even though they might have the same number of calories or carbohydrates. It is difficult to get your body what it needs when you eat foods with fewer nutrients. Examples of foods that you should avoid that are high in calories and carbohydrates but low in nutrients include:  Trans fats (most processed foods list trans fats on the Nutrition Facts label).  Regular soda.  Juice.  Candy.  Sweets, such as cake, pie, doughnuts,  and cookies.  Fried foods. WHAT FOODS CAN I EAT? Have nutrient-rich foods, which will nourish your body and keep you healthy. The food you should eat also will depend on several factors, including:  The calories you need.  The medicines you take.  Your weight.  Your blood glucose level.  Your blood pressure level.  Your cholesterol level. You also should eat a variety of foods, including:  Protein, such as meat, poultry, fish, tofu, nuts, and seeds (lean animal proteins are best).  Fruits.  Vegetables.  Dairy products, such as milk, cheese, and yogurt (low fat is best).  Breads, grains, pasta, cereal, rice, and beans.  Fats such as olive oil, trans fat-free margarine, canola oil, avocado, and olives. DOES EVERYONE WITH DIABETES MELLITUS HAVE THE SAME MEAL PLAN? Because every person with diabetes mellitus is different, there is not one meal plan that works for everyone. It is very important that you meet with a dietitian who will help you create a meal plan that is just right for you. Document Released: 06/15/2005 Document Revised: 09/23/2013 Document Reviewed: 08/15/2013 [facility] Patient Information 2015 [place], [place]. This information is not intended to replace advice given to you by your health care provider. Make sure you discuss any questions you have with your health care provider.   [employer] Patient Information 2015 [place]. This information is not intended to replace advice given to you by your health care provider. Make sure you discuss any questions you have with your health care provider.

## 2014-11-11 NOTE — Progress Notes (Signed)
11/11/2014 A. Gabrielle Mester RNCM 1530pm EDCM called Dr. Barbie Banner office and spoke to [place].  EDCM inofrmed [name] that patient is interested in receiving home health services and asked if that could be arranged.  Per [name], patient must call to schedule a follow up visit with Dr. Sarajane Jews and can discuss home health services during the appointment.  EDCM called patient to inform her that she needs to call Dr. Sarajane Jews and make a follow up appointment.  EDCM instructed patient to make pcp office aware it is a follow up Ed visit. EDCM also encouraged patient to ask pcp for assistance in obtaining a rolator.  Patient verbalized understanding.  No further EDCM needs at this time.

## 2014-11-13 ENCOUNTER — Encounter: Payer: Self-pay | Admitting: Family Medicine

## 2014-11-13 ENCOUNTER — Telehealth: Payer: Self-pay | Admitting: Family Medicine

## 2014-11-13 ENCOUNTER — Ambulatory Visit (INDEPENDENT_AMBULATORY_CARE_PROVIDER_SITE_OTHER): Payer: Medicare Other | Admitting: Family Medicine

## 2014-11-13 VITALS — BP 149/76 | HR 96 | Temp 98.2°F | Ht 67.5 in | Wt 235.0 lb

## 2014-11-13 DIAGNOSIS — M5442 Lumbago with sciatica, left side: Secondary | ICD-10-CM

## 2014-11-13 DIAGNOSIS — G459 Transient cerebral ischemic attack, unspecified: Secondary | ICD-10-CM

## 2014-11-13 DIAGNOSIS — R2681 Unsteadiness on feet: Secondary | ICD-10-CM | POA: Insufficient documentation

## 2014-11-13 DIAGNOSIS — E119 Type 2 diabetes mellitus without complications: Secondary | ICD-10-CM

## 2014-11-13 DIAGNOSIS — M5441 Lumbago with sciatica, right side: Secondary | ICD-10-CM

## 2014-11-13 DIAGNOSIS — R531 Weakness: Secondary | ICD-10-CM | POA: Insufficient documentation

## 2014-11-13 HISTORY — DX: Weakness: R53.1

## 2014-11-13 HISTORY — DX: Unsteadiness on feet: R26.81

## 2014-11-13 HISTORY — DX: Transient cerebral ischemic attack, unspecified: G45.9

## 2014-11-13 MED ORDER — INSULIN GLARGINE 100 UNIT/ML SOLOSTAR PEN: PEN_INJECTOR | SUBCUTANEOUS | Status: DC

## 2014-11-13 NOTE — Telephone Encounter (Signed)
Per Dr. Sarajane Jews, order for PT to evaluate gait, weakness, & instability. Also pt needs refill on Accu-chek aviva plus strips, pen needles and lancets for [facility], send to mail order.

## 2014-11-13 NOTE — Progress Notes (Signed)
Pre visit review using our clinic review tool, if applicable. No additional management support is needed unless otherwise documented below in the visit note. 

## 2014-11-13 NOTE — Progress Notes (Signed)
   Subjective:    Patient ID: Melanie Mcdonald, female    DOB: [birth year], 81 y.o.   MRN: [id number]  HPI Here to follow up on an ER visit on 11-10-14 for weakness and speech problems and difficulty walking. Her labs revealed a glucose of 575. It was not clear if she was having another TIA or if this was from metabolic derangement. She was given fluids and told to follow up with me. Since going home she has been more vigilant about her diet but her glucoses are still labile, ranging from 126 fasting to 200 fasting. She is still taking Lantus 100 units at bedtime and she is using Novolog 70/30 at 25-15-25 with meals. She has totally stopped driving and her son has driven her here today. She is still very unsteady on her feet, and she asks if PT could help. She is now practically homebound because it is so difficult for her to get out of the house.    Review of Systems  Respiratory: Negative.   Cardiovascular: Negative.   Neurological: Positive for weakness and light-headedness. Negative for dizziness, tremors, seizures, syncope, facial asymmetry, speech difficulty, numbness and headaches.       Objective:   Physical Exam  Constitutional: She is oriented to person, place, and time.  Alert but weak and very unsteady on her feet. She cannot walk down the hall without assistance.   Cardiovascular: Normal rate, regular rhythm, normal heart sounds and intact distal pulses.   Pulmonary/Chest: Effort normal and breath sounds normal.  Neurological: She is alert and oriented to person, place, and time. No cranial nerve deficit. Coordination normal.          Assessment & Plan:  She would benefit from PT to help with gait stability and increasing leg strength, so we will consult home health. I asked her to continue her evening dose of Lantus but to also take 50 units of Lantus every morning. Recheck one month

## 2014-11-16 MED ORDER — BAYER MICROLET LANCETS MISC: Status: DC

## 2014-11-16 MED ORDER — INSULIN PEN NEEDLE 32G X 6 MM MISC: Status: DC

## 2014-11-16 MED ORDER — GLUCOSE BLOOD VI STRP: ORAL_STRIP | Status: DC

## 2014-11-16 NOTE — Telephone Encounter (Signed)
I sent 3 scripts e-scribe to mail order, also spoke with Arville Go [phone] and they requested a written order and fax to [phone], which I did.

## 2014-11-17 ENCOUNTER — Telehealth: Payer: Self-pay | Admitting: Cardiology

## 2014-11-17 ENCOUNTER — Telehealth: Payer: Self-pay | Admitting: Family Medicine

## 2014-11-17 ENCOUNTER — Other Ambulatory Visit: Payer: Self-pay | Admitting: Family Medicine

## 2014-11-17 MED ORDER — INSULIN GLARGINE 100 UNIT/ML SOLOSTAR PEN: PEN_INJECTOR | SUBCUTANEOUS | Status: DC

## 2014-11-17 NOTE — Telephone Encounter (Signed)
Pt is calling with complaints of constipation since being started on Metolazone 2.5 mg daily.  Pt states that she hasn't had a soft BM since starting this regimen.  Asked pt her current diet and fluid intake regimen.  Pt reports that she has not been eating properly at all, she mostly eats unhealthy foods.  Pt reports she is not drinking water as she should, she states she feels "dried out." Advised the pt that she needs to increase the fiber and water intake in her diet.  Pt education provided on different foods like oatmeal, prunes, warm apple and prune juice, and green-leafy veges.  Advised the pt that when she goes to the store to get her fiber foods, she should purchase some OTC miralax and metamucil or stool softener to help aid in an easy BM.  Advised the pt to continue taking her current med regimen as prescribed.  Advised the pt that if she doesn't have a BM with the given regimen mentioned above in the next day or 2, she can follow-up with our office on this matter or contact her PCP for further advice.  Pt reports no other cardiac complaints at this time. No sob, cp, dizziness, and pt reports her LEE has significantly improved. Informed pt that I will route this message to Dr Meda Coffee for further review.  Pt verbalized understanding and agrees with this plan.

## 2014-11-17 NOTE — Telephone Encounter (Signed)
Received fax from [facility]. Need to change quantity to 135 ml Lantus Solostar Pen, was faxed in.

## 2014-11-17 NOTE — Telephone Encounter (Signed)
Thank you Karlene Einstein for all the recommendation. Please call her in 1 week to see how she is doing.

## 2014-11-17 NOTE — Telephone Encounter (Signed)
Pt c/o medication issue:  1. Name of Medication: Metolazone 2.5mg   2. How are you currently taking this medication (dosage and times per day)? 1 a day   3. Are you having a reaction (difficulty breathing--STAT)? no  4. What is your medication issue? Medication is making her constipated.   Pt would like a call back from nurse b/c she is in so much pain. Please call pt.

## 2014-11-23 ENCOUNTER — Telehealth: Payer: Self-pay | Admitting: Family Medicine

## 2014-11-23 NOTE — Telephone Encounter (Signed)
[facility] needs verbal orders for physical therapy at 2 x /wk for 6 weeks

## 2014-11-24 ENCOUNTER — Encounter: Payer: Self-pay | Admitting: Family Medicine

## 2014-11-24 ENCOUNTER — Ambulatory Visit (INDEPENDENT_AMBULATORY_CARE_PROVIDER_SITE_OTHER): Payer: Medicare Other | Admitting: Family Medicine

## 2014-11-24 ENCOUNTER — Telehealth: Payer: Self-pay | Admitting: Family Medicine

## 2014-11-24 VITALS — BP 138/76 | HR 84 | Temp 98.2°F

## 2014-11-24 DIAGNOSIS — T63301A Toxic effect of unspecified spider venom, accidental (unintentional), initial encounter: Secondary | ICD-10-CM

## 2014-11-24 MED ORDER — LEVOFLOXACIN 500 MG PO TABS: 500.0000 mg | ORAL_TABLET | Freq: Every day | ORAL | Status: DC

## 2014-11-24 NOTE — Progress Notes (Signed)
Pre visit review using our clinic review tool, if applicable. No additional management support is needed unless otherwise documented below in the visit note. 

## 2014-11-24 NOTE — Progress Notes (Signed)
   Subjective:    Patient ID: Melanie Mcdonald, female    DOB: April 12, 1933, 81 y.o.   MRN: [id number]  HPI Here to check a possible spider bite on the right arm which she noticed 3 days ago. Last weekend she was moving some dusty objects from her porch into the house and she thinks this is where she was exposed. The area has swollen and is red and tender. No fever.    Review of Systems  Constitutional: Negative.   Skin: Positive for color change.       Objective:   Physical Exam  Constitutional: She is oriented to person, place, and time. She appears well-developed and well-nourished.  Cardiovascular: Normal rate, regular rhythm, normal heart sounds and intact distal pulses.   Pulmonary/Chest: Effort normal and breath sounds normal.  Neurological: She is alert and oriented to person, place, and time.  Skin:  The right arm has an area of erythema, swelling, warmth, an tenderness over the elbow          Assessment & Plan:  Probable brown recluse bite. Cover with Levaquin and use ice packs.

## 2014-11-24 NOTE — Telephone Encounter (Signed)
Pt is on the schedule today to see Dr. Sarajane Jews.

## 2014-11-24 NOTE — Telephone Encounter (Signed)
Patient Name: Melanie Mcdonald DOB: 25-May-1933 Initial Comment Caller states she has CHF. She got bit by something in two places. Swelling and hot to touch, blister. Nurse Assessment Nurse: Vallery Sa, RN, Cathy Date/Time (Eastern Time): 11/24/2014 10:12:16 AM Confirm and document reason for call. If symptomatic, describe symptoms. ---Caller states she had two unknown insect bites on her elbow and leg 2 days ago. No severe breathing or swallowing difficulty. No fever. Has the patient traveled out of the country within the last 30 days? ---No Does the patient require triage? ---Yes Related visit to physician within the last 2 weeks? ---No Does the PT have any chronic conditions? (i.e. diabetes, asthma, etc.) ---Yes List chronic conditions. ---CHF, High Blood Pressure, Back and knee problems Guidelines Guideline Title Affirmed Question Affirmed Notes Insect Bite [1] Red or very tender (to touch) area AND [2] getting larger over 48 hours after the bite Breathing Difficulty [1] MODERATE difficulty breathing (e.g., speaks in phrases, SOB even at rest, pulse 100-120) AND [2] NEW-onset or WORSE than normal Final Disposition User Go to ED Now Vallery Sa, RN, [place] Comments After triage completed, caller shared she developed some breathing difficulty today. [name] declined the Go to ER disposition. Reinforced the Go to ER disposition. She states she doesn't want to go to the ER, but would go if she becomes worse. She states she would prefer to see Dr. Sarajane Jews at the previously scheduled 4:15 appointment time. Called the office backline and Estill Bamberg was notified that [name] declined the Go to ER disposition. Estill Bamberg states they will follow up with [name].

## 2014-11-24 NOTE — Telephone Encounter (Signed)
Per Dr. Sarajane Jews okay and I did speak with Arville Go and gave verbal orders.

## 2014-11-25 NOTE — Telephone Encounter (Signed)
Contacted the pt to check in and see how she is feeling since complaints of constipation on 11/17/14.  Pt states she took our advice and started eating cleaner, drinking more fluids, uses miralax as needed, and increased the fiber in her diet and states that she had a BM a day later, and felt much relief.  Advised the pt to continue with instructions given on aiding with constipation.  Informed the pt that I will make Dr Meda Coffee aware her current status. Pt verbalized understanding and gracious for all the assistance provided.

## 2014-11-30 ENCOUNTER — Ambulatory Visit (INDEPENDENT_AMBULATORY_CARE_PROVIDER_SITE_OTHER): Payer: Medicare Other | Admitting: Family Medicine

## 2014-11-30 ENCOUNTER — Encounter: Payer: Self-pay | Admitting: Family Medicine

## 2014-11-30 ENCOUNTER — Telehealth: Payer: Self-pay

## 2014-11-30 VITALS — BP 132/64 | HR 88 | Temp 97.3°F

## 2014-11-30 DIAGNOSIS — L03116 Cellulitis of left lower limb: Secondary | ICD-10-CM | POA: Diagnosis not present

## 2014-11-30 DIAGNOSIS — M7989 Other specified soft tissue disorders: Secondary | ICD-10-CM

## 2014-11-30 NOTE — Progress Notes (Signed)
   Subjective:    Patient ID: Melanie Mcdonald, female    DOB: Sep 19, 1933, 81 y.o.   MRN: [id number]  HPI Here for 2 days of left lower leg swelling, redness, warmth, pain and itching. No chest pain or SOB. She thinks she may have been bitten by a spider since it developed after she was cleaning out her koi pond last weekend and getting supplies from under her porch. She is taking Benadryl every 4 hours. She was seen here on 11-24-14 for some apparent bites on the right lower leg and I gave her a rx for Levaquin, however she never had this filled. The right leg lesions are clearing up nicely.    Review of Systems  Constitutional: Negative.   Respiratory: Negative.   Cardiovascular: Positive for leg swelling.  Skin: Positive for color change.       Objective:   Physical Exam  Constitutional: She appears well-developed and well-nourished. No distress.  Cardiovascular: Normal rate, regular rhythm, normal heart sounds and intact distal pulses.   Pulmonary/Chest: Effort normal and breath sounds normal.  Musculoskeletal:  The left lower leg is swollen, red, warm and tender. The calf is tender. No cords are felt.           Assessment & Plan:  This appears to be cellulitis, and may have originated from  Spider bite. I advised her to go ahead and fill the rx for Levaquin. Keep the leg elevated, use ice packs. Given a steroid shot. We will set her up for a venous doppler tomorrow to rule out any DVT.

## 2014-11-30 NOTE — Telephone Encounter (Signed)
Pt was outside near pond when she was bitten yesterday. Bite is raised and bumpy on left ankle that is painful, itchy, red up to about 3 inches below knee and warm to the touch. Vitals BP 132/64, T 97.3, P 88.  Appt scheduled

## 2014-11-30 NOTE — Progress Notes (Signed)
Pre visit review using our clinic review tool, if applicable. No additional management support is needed unless otherwise documented below in the visit note. 

## 2014-12-01 ENCOUNTER — Other Ambulatory Visit (INDEPENDENT_AMBULATORY_CARE_PROVIDER_SITE_OTHER): Payer: Medicare Other | Admitting: *Deleted

## 2014-12-01 ENCOUNTER — Ambulatory Visit (HOSPITAL_COMMUNITY): Payer: Medicare Other | Attending: Cardiology | Admitting: Cardiology

## 2014-12-01 DIAGNOSIS — M7989 Other specified soft tissue disorders: Secondary | ICD-10-CM | POA: Diagnosis not present

## 2014-12-01 DIAGNOSIS — M79605 Pain in left leg: Secondary | ICD-10-CM | POA: Diagnosis not present

## 2014-12-01 DIAGNOSIS — I5033 Acute on chronic diastolic (congestive) heart failure: Secondary | ICD-10-CM

## 2014-12-01 DIAGNOSIS — I10 Essential (primary) hypertension: Secondary | ICD-10-CM

## 2014-12-01 DIAGNOSIS — G4730 Sleep apnea, unspecified: Secondary | ICD-10-CM

## 2014-12-01 LAB — BASIC METABOLIC PANEL WITH GFR
BUN: 18 mg/dL (ref 6–23)
CO2: 34 meq/L — ABNORMAL HIGH (ref 19–32)
Calcium: 9.2 mg/dL (ref 8.4–10.5)
Chloride: 104 meq/L (ref 96–112)
Creatinine, Ser: 0.76 mg/dL (ref 0.40–1.20)
GFR: 77.95 mL/min
Glucose, Bld: 161 mg/dL — ABNORMAL HIGH (ref 70–99)
Potassium: 4 meq/L (ref 3.5–5.1)
Sodium: 139 meq/L (ref 135–145)

## 2014-12-01 MED ORDER — METHYLPREDNISOLONE ACETATE 80 MG/ML IJ SUSP
120.0000 mg | Freq: Once | INTRAMUSCULAR | Status: AC
  Administered 2014-11-30: 120 mg via INTRAMUSCULAR

## 2014-12-01 NOTE — Addendum Note (Signed)
Addended by: Santiago Bumpers on: 12/01/2014 08:04 AM   Modules accepted: Orders

## 2014-12-01 NOTE — Progress Notes (Signed)
Left lower venous duplex performed  

## 2014-12-02 ENCOUNTER — Other Ambulatory Visit: Payer: Medicare Other

## 2014-12-14 ENCOUNTER — Ambulatory Visit: Payer: Medicare Other | Admitting: Cardiology

## 2014-12-17 ENCOUNTER — Telehealth: Payer: Self-pay | Admitting: Family Medicine

## 2014-12-17 NOTE — Telephone Encounter (Signed)
Pt called to ask for the name of the company that comes out to see her. She need to cancel her appt .

## 2014-12-17 NOTE — Telephone Encounter (Signed)
I spoke with pt and gave her [employer]'s number.

## 2014-12-21 ENCOUNTER — Ambulatory Visit (INDEPENDENT_AMBULATORY_CARE_PROVIDER_SITE_OTHER): Payer: Medicare Other | Admitting: Cardiology

## 2014-12-21 ENCOUNTER — Encounter: Payer: Self-pay | Admitting: Cardiology

## 2014-12-21 VITALS — BP 146/78 | HR 87 | Ht 67.5 in | Wt 251.8 lb

## 2014-12-21 DIAGNOSIS — R0609 Other forms of dyspnea: Secondary | ICD-10-CM | POA: Diagnosis not present

## 2014-12-21 DIAGNOSIS — R0600 Dyspnea, unspecified: Secondary | ICD-10-CM

## 2014-12-21 DIAGNOSIS — G4733 Obstructive sleep apnea (adult) (pediatric): Secondary | ICD-10-CM

## 2014-12-21 DIAGNOSIS — I5033 Acute on chronic diastolic (congestive) heart failure: Secondary | ICD-10-CM

## 2014-12-21 DIAGNOSIS — E785 Hyperlipidemia, unspecified: Secondary | ICD-10-CM

## 2014-12-21 DIAGNOSIS — R41840 Attention and concentration deficit: Secondary | ICD-10-CM

## 2014-12-21 NOTE — Progress Notes (Signed)
Patient ID: AARTI MANKOWSKI, female   DOB: 04/18/1933, 81 y.o.   MRN: [id number]    Patient Name: Melanie Mcdonald Date of Encounter: 12/21/2014  Primary Care Provider:  Laurey Morale, MD Primary Cardiologist:  Dorothy Spark  Problem List   Past Medical History  Diagnosis Date  . ANEMIA 01/30/2008  . ARTHRITIS 12/11/2007  . BACK PAIN, CHRONIC 06/23/2008  . BREAST CYST, RIGHT 12/17/2007  . CONTACT DERMATITIS 03/10/2009  . CORONARY ARTERY DISEASE 03/01/2007    had a normal Myoview stress test 07-13-11  . CYSTOCELE WITHOUT MENTION UTERINE PROLAPSE LAT 09/12/2010  . DEGENERATIVE JOINT DISEASE 08/07/2007  . DEPRESSION 03/01/2007  . Esophageal reflux 11/06/2007  . HYPERKERATOSIS 06/02/2009  . HYPERLIPIDEMIA 08/26/2008  . HYPERTENSION 03/01/2007  . Irritable bowel syndrome 01/26/2009  . LABYRINTHITIS 11/03/2009  . Narcolepsy without cataplexy 08/26/2008  . OBESITY 03/01/2007  . PERIPHERAL VASCULAR DISEASE 01/26/2009  . SYNCOPE 08/26/2008    had brain MRI on 06-28-12 showing only chronic microvascular ischemia and atrophy   . TRANSIENT ISCHEMIC ATTACK 10/20/2009    had normal brain MRA with patent vertebrals and carotids 06-28-12  . NEPHROLITHIASIS 04/29/2009  . COPD (chronic obstructive pulmonary disease)   . Colon polyps     FRAGMENTS OF HYPERPLASTIC POLYP  . Diverticulosis of colon (without mention of hemorrhage)   . DIABETES MELLITUS, TYPE II 08/26/2008    sees Dr. Dwyane Dee    Past Surgical History  Procedure Laterality Date  . Cataract extraction    . Abdominal hysterectomy    . Back surgery      x2  . Tonsilectomy, adenoidectomy, bilateral myringotomy and tubes    . Spine surgery      x 3  . Cardiac catheterization  07/2009  . Intraocular lens insertion    . Esophagogastroduodenoscopy  11-13-07    per Dr. Deatra Ina, normal   . Colonoscopy  11-13-07    per Dr. Deatra Ina, benign polyps, repeat in 5 yrs   Allergies  Allergies  Allergen Reactions  . Aspirin Other (See Comments)    Abdominal  pain  . Clarithromycin     Made stomach burn  . Doxycycline Itching  . Erythromycin Nausea And Vomiting  . Metolazone     Pt stated this made her BP drop  . Other     Pt cannot have any of the MYCIN's  . Penicillins Swelling  . Sulfonamide Derivatives Nausea And Vomiting   HPI  81 year old female who appears to be significantly fatigued and depressed, with prior medical history of hypertension, hyperlipidemia, obesity, narcolepsy, multiple TIAs, prior cardiac catheterization with no intervention and negative stress test in 2012. Patient states that she has been profoundly fatigued and short of breath. 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 Patient appears very tired and in fact falls asleep twice during conversation and appears significantly short of breath just walking a few steps. She hasn't had pulmonary function test or chest CT. She underwent echocardiographic in March of this year that showed preserved left ventricular ejection fraction, no comment about diastolic function, mild mitral stenosis and no comment about right-sided pressures. She states that she always feels dizzy and she has been worked up for bradycardia in the past during surgery day heart monitor but is not sure what was decided based on the results. There is somedate and  that her heart rate went as low as 40s and there was consideration about placement of permanent pacemaker. The patient states that she since she last saw Dr. Gwenlyn Found she has had stable daily dizziness but no syncope. She doesn't want to follow with Dr. Gwenlyn Found. The patient states that she has a history of asthma but she's not using any inhalers.  11/03/2014 - the patient is coming after 6 months she feels the same significant fatigue and shortness of breath with minimal exertion. No syncope or  palpitations. No chest pain. She admits that she feels like she's taking a lot of medicines and since she had syncopal episode after being started multiple medicines she only takes medicines as needed. She complains of significant lower extremity edema.  12/21/2014 - patient is coming after 1 months, she states that she was not taking amlodipine and metolazone as her blood pressure was very low. She is not taking Lasix all the time as it forces her to go to bathroom a lot and she is unable to get things done. Lower extremity edema has improved, however she states that in the last month she stayed in bed a lot and ate a lot of junk food and gained 5 pounds. She denies any chest pain, shortness of breath, orthopnea paroxysmal nocturnal dyspnea palpitations or syncope.    Home Medications  Prior to Admission medications   Medication Sig Start Date End Date Taking? Authorizing Provider  buPROPion (WELLBUTRIN XL) 150 MG 24 hr tablet Take 1 tablet (150 mg total) by mouth daily. 03/19/14   Laurey Morale, MD  diclofenac sodium (VOLTAREN) 1 % GEL Apply 4 g topically 4 (four) times daily as needed (for arthritis pain in shoulders and fingers). For joint pain. 04/07/13   Laurey Morale, MD  furosemide (LASIX) 40 MG tablet TAKE 1 TABLET BY MOUTH TWICE DAILY 03/17/14   Laurey Morale, MD  Insulin Aspart Prot & Aspart (NOVOLOG MIX 70/30 FLEXPEN) (70-30) 100 UNIT/ML Pen Inject 10 Units into the skin 2 (two) times daily. 02/27/14   Laurey Morale, MD  insulin glargine (LANTUS) 100 unit/mL SOPN Inject 100 Units into the skin at bedtime.    Historical Provider, MD  ketoconazole (NIZORAL) 2 % cream Apply 1 application topically 2 (two) times daily as needed for irritation. 03/19/14   Laurey Morale, MD  meclizine (ANTIVERT) 25 MG tablet Take 25 mg by mouth every 4 (four) hours as needed for dizziness.    Historical Provider, MD  nitroGLYCERIN (NITROSTAT) 0.4 MG SL tablet Place 1 tablet (0.4 mg total) under the tongue every 5  (five) minutes as needed for chest pain. 07/29/13   Laurey Morale, MD  oxyCODONE-acetaminophen (PERCOCET) 10-325 MG per tablet Take 1 tablet by mouth at bedtime. 12/10/13   Laurey Morale, MD  potassium chloride (KLOR-CON 10) 10 MEQ tablet Take 1 tablet (10 mEq total) by mouth daily. 02/27/14   Laurey Morale, MD  pramipexole (MIRAPEX) 1 MG tablet Take 3 mg by mouth at bedtime.    Historical Provider, MD  promethazine (PHENERGAN) 25 MG tablet Take 1 tablet (25 mg total) by mouth every 6 (six) hours as needed for nausea (nausea). For nausea. 05/22/13   Laurey Morale, MD  zolpidem (AMBIEN) 10 MG tablet Take 10 mg by mouth at bedtime.    Historical Provider, MD    Family History  Family History  Problem Relation Age of Onset  . Lupus Mother   . COPD Father  Social History  History   Social History  . Marital Status: Widowed    Spouse Name: N/A  . Number of Children: N/A  . Years of Education: N/A   Occupational History  . Not on file.   Social History Main Topics  . Smoking status: Never Smoker   . Smokeless tobacco: Never Used  . Alcohol Use: No  . Drug Use: No  . Sexual Activity: No   Other Topics Concern  . Not on file   Social History Narrative    Review of Systems, as per HPI, otherwise negative General:  No chills, fever, night sweats or weight changes.  Cardiovascular:  No chest pain, dyspnea on exertion, edema, orthopnea, palpitations, paroxysmal nocturnal dyspnea. Dermatological: No rash, lesions/masses Respiratory: No cough, dyspnea Urologic: No hematuria, dysuria Abdominal:   No nausea, vomiting, diarrhea, bright red blood per rectum, melena, or hematemesis Neurologic:  No visual changes, wkns, changes in mental status. All other systems reviewed and are otherwise negative except as noted above.  Physical Exam  Blood pressure 146/78, pulse 87, height 5' 7.5" (1.715 m), weight 251 lb 12.8 oz (114.216 kg), SpO2 97 %.  General: Pleasant, NAD Psych: Normal  affect. Neuro: Alert and oriented X 3. Moves all extremities spontaneously. HEENT: Normal  Neck: Supple without bruits or JVD. Lungs:  Resp regular and unlabored, CTA. Heart: RRR no s3, s4, or murmurs. Abdomen: Soft, non-tender, non-distended, BS + x 4.  Extremities: No clubbing, cyanosis, severe non-pitting B/L, changes consistent with chronic venous insufficiency. DP/PT/Radials 2+ and equal bilaterally.  Labs:  No results for input(s): CKTOTAL, CKMB, TROPONINI in the last 72 hours. Lab Results  Component Value Date   WBC 9.6 11/10/2014   HGB 13.3 11/10/2014   HCT 40.0 11/10/2014   MCV 87.0 11/10/2014   PLT 222 11/10/2014    Lab Results  Component Value Date   DDIMER 0.50* 02/13/2014   Invalid input(s): POCBNP    Component Value Date/Time   NA 139 12/01/2014 1518   K 4.0 12/01/2014 1518   CL 104 12/01/2014 1518   CO2 34* 12/01/2014 1518   GLUCOSE 161* 12/01/2014 1518   BUN 18 12/01/2014 1518   CREATININE 0.76 12/01/2014 1518   CALCIUM 9.2 12/01/2014 1518   PROT 8.2 11/10/2014 1210   ALBUMIN 4.0 11/10/2014 1210   AST 22 11/10/2014 1210   ALT 19 11/10/2014 1210   ALKPHOS 114 11/10/2014 1210   BILITOT 0.8 11/10/2014 1210   GFRNONAA 41* 11/10/2014 1210   GFRAA 48* 11/10/2014 1210   Lab Results  Component Value Date   CHOL 134 01/15/2012   HDL 60.30 01/15/2012   LDLCALC 57 01/15/2012   TRIG 83.0 01/15/2012   Accessory Clinical Findings  Echocardiogram - 12/16/2013  - Left ventricle: The cavity size was normal. Wall thickness was increased in a pattern of severe LVH. Systolic function was normal. The estimated ejection fraction was in the range of 55% to 60%. - Mitral valve: Calcified annulus. Moderately calcified leaflets . The findings are consistent with mild stenosis. - Left atrium: The atrium was moderately dilated. - Atrial septum: No defect or patent foramen ovale was identified.  ECG - sinus rhythm  Lexiscan nuclear stress test: Quantitative Gated  Spect Images QGS EDV: 87 ml QGS ESV: 34 ml  Impression Exercise Capacity: Lexiscan with no exercise. BP Response: Hypotensive blood pressure response. Clinical Symptoms: No significant symptoms noted. ECG Impression: No significant ECG changes with Lexiscan. Comparison with Prior Nuclear Study: No images to compare  Overall Impression: Low risk stress nuclear study with small area of fixed inferobasal bowel attenuation artifact. No reversible ischemia.  LV Ejection Fraction: 61%. LV Wall Motion: NL LV Function; NL Wall Motion  Pixie Casino, MD, [facility] Board Certified in Nuclear Cardiology Attending Cardiologist [place]  81 year old female with poly morbidities  1. Severe SOB - sec to uncontrolled HTN -  Lexiscan nuclear stress test was negative - CT/CTA chest to assess for parenchymal lung disease and chronic thromboembolic disease that were ruled out.There was no significant finding on her chest CT but finding of thyroid nodule, that was biopsied and showed benign nodule. - started Advair 250/50 BID and albuterol PRN, however normal pulmonary function test was normal  2. Hypertension with severe concentric LVH on echocardiogram, amlodipine and metolazone caused hypotension, today's blood pressure is mildly elevated, she is instructed to make sure she is taking Toprol XL 25 mg daily.   3. Acute and chronic diastolic CHF - the patient is not able to take metolazone causes her hypotension, she is encouraged to continue taking Lasix 40 mg twice a day and elevate her lower extremities and use compression stockings as much as possible. I reviewed her basic metabolic profile from 20/35/5974 and her electrolytes and creatinine were normal.  4. Obstructive apnea - and narcolepsy seems majority of symptoms can be related to the obstructive sleep apnea, is a sleep study scheduled for next month.   5. Lipids - not checked since 2013, we will check  today and NMR lipid profile.  Follow-up in 6 months, NMR with lipids today.  Dorothy Spark, MD, [facility] 12/21/2014, 11:27 AM

## 2014-12-21 NOTE — Patient Instructions (Signed)
Your physician has recommended you make the following change in your medication:  1)  START taking Magnesium daily (Buy over the counter)  Your physician recommends that you have lab work: today ([place])  Your physician wants you to follow-up in: 6 months with Dr. Meda Coffee. You will receive a reminder letter in the mail two months in advance. If you don't receive a letter, please call our office to schedule the follow-up appointment.

## 2014-12-23 ENCOUNTER — Encounter: Payer: Self-pay | Admitting: Cardiology

## 2014-12-23 LAB — NMR LIPOPROFILE WITH LIPIDS
Cholesterol, Total: 160 mg/dL (ref 100–199)
HDL Particle Number: 25.9 umol/L — ABNORMAL LOW (ref >=30.5)
HDL Size: 10.5 nm (ref >=9.2)
HDL-C: 74 mg/dL (ref >39)
LDL (calc): 74 mg/dL (ref 0–99)
LDL Particle Number: 684 nmol/L (ref <1000)
LDL Size: 21.5 nm (ref >=20.8)
LP-IR Score: <25 (ref <=45)
Large HDL-P: 12.8 umol/L (ref >=4.8)
Large VLDL-P: 0.8 nmol/L (ref <=2.7)
Small LDL Particle Number: <90 nmol/L (ref <=527)
Triglycerides: 62 mg/dL (ref 0–149)
VLDL Size: 50.8 nm — ABNORMAL HIGH (ref <=46.6)

## 2014-12-28 ENCOUNTER — Telehealth: Payer: Self-pay | Admitting: Gastroenterology

## 2014-12-28 NOTE — Telephone Encounter (Signed)
Spoke with patient and read her OV note as to why recommendation was made.

## 2014-12-30 ENCOUNTER — Ambulatory Visit (INDEPENDENT_AMBULATORY_CARE_PROVIDER_SITE_OTHER): Payer: Medicare Other | Admitting: Family

## 2014-12-30 ENCOUNTER — Encounter: Payer: Self-pay | Admitting: Family

## 2014-12-30 VITALS — BP 140/70 | HR 87 | Temp 98.3°F | Wt 248.6 lb

## 2014-12-30 DIAGNOSIS — J069 Acute upper respiratory infection, unspecified: Secondary | ICD-10-CM | POA: Diagnosis not present

## 2014-12-30 DIAGNOSIS — R059 Cough, unspecified: Secondary | ICD-10-CM

## 2014-12-30 DIAGNOSIS — R05 Cough: Secondary | ICD-10-CM | POA: Diagnosis not present

## 2014-12-30 MED ORDER — FLUTICASONE PROPIONATE 50 MCG/ACT NA SUSP: 2.0000 | Freq: Every day | NASAL | Status: DC

## 2014-12-30 NOTE — Patient Instructions (Addendum)
1. Zyrtec once a day.   Upper Respiratory Infection, Adult An upper respiratory infection (URI) is also sometimes known as the common cold. The upper respiratory tract includes the nose, sinuses, throat, trachea, and bronchi. Bronchi are the airways leading to the lungs. Most people improve within 1 week, but symptoms can last up to 2 weeks. A residual cough may last even longer.  CAUSES Many different viruses can infect the tissues lining the upper respiratory tract. The tissues become irritated and inflamed and often become very moist. Mucus production is also common. A cold is contagious. You can easily spread the virus to others by oral contact. This includes kissing, sharing a glass, coughing, or sneezing. Touching your mouth or nose and then touching a surface, which is then touched by another person, can also spread the virus. SYMPTOMS  Symptoms typically develop 1 to 3 days after you come in contact with a cold virus. Symptoms vary from person to person. They may include:  Runny nose.  Sneezing.  Nasal congestion.  Sinus irritation.  Sore throat.  Loss of voice (laryngitis).  Cough.  Fatigue.  Muscle aches.  Loss of appetite.  Headache.  Low-grade fever. DIAGNOSIS  You might diagnose your own cold based on familiar symptoms, since most people get a cold 2 to 3 times a year. Your caregiver can confirm this based on your exam. Most importantly, your caregiver can check that your symptoms are not due to another disease such as strep throat, sinusitis, pneumonia, asthma, or epiglottitis. Blood tests, throat tests, and X-rays are not necessary to diagnose a common cold, but they may sometimes be helpful in excluding other more serious diseases. Your caregiver will decide if any further tests are required. RISKS AND COMPLICATIONS  You may be at risk for a more severe case of the common cold if you smoke cigarettes, have chronic heart disease (such as heart failure) or lung  disease (such as asthma), or if you have a weakened immune system. The very young and very old are also at risk for more serious infections. Bacterial sinusitis, middle ear infections, and bacterial pneumonia can complicate the common cold. The common cold can worsen asthma and chronic obstructive pulmonary disease (COPD). Sometimes, these complications can require emergency medical care and may be life-threatening. PREVENTION  The best way to protect against getting a cold is to practice good hygiene. Avoid oral or hand contact with people with cold symptoms. Wash your hands often if contact occurs. There is no clear evidence that vitamin C, vitamin E, echinacea, or exercise reduces the chance of developing a cold. However, it is always recommended to get plenty of rest and practice good nutrition. TREATMENT  Treatment is directed at relieving symptoms. There is no cure. Antibiotics are not effective, because the infection is caused by a virus, not by bacteria. Treatment may include:  Increased fluid intake. Sports drinks offer valuable electrolytes, sugars, and fluids.  Breathing heated mist or steam (vaporizer or shower).  Eating chicken soup or other clear broths, and maintaining good nutrition.  Getting plenty of rest.  Using gargles or lozenges for comfort.  Controlling fevers with ibuprofen or acetaminophen as directed by your caregiver.  Increasing usage of your inhaler if you have asthma. Zinc gel and zinc lozenges, taken in the first 24 hours of the common cold, can shorten the duration and lessen the severity of symptoms. Pain medicines may help with fever, muscle aches, and throat pain. A variety of non-prescription medicines are available to  treat congestion and runny nose. Your caregiver can make recommendations and may suggest nasal or lung inhalers for other symptoms.  HOME CARE INSTRUCTIONS   Only take over-the-counter or prescription medicines for pain, discomfort, or fever as  directed by your caregiver.  Use a warm mist humidifier or inhale steam from a shower to increase air moisture. This may keep secretions moist and make it easier to breathe.  Drink enough water and fluids to keep your urine clear or pale yellow.  Rest as needed.  Return to work when your temperature has returned to normal or as your caregiver advises. You may need to stay home longer to avoid infecting others. You can also use a face mask and careful hand washing to prevent spread of the virus. SEEK MEDICAL CARE IF:   After the first few days, you feel you are getting worse rather than better.  You need your caregiver's advice about medicines to control symptoms.  You develop chills, worsening shortness of breath, or brown or red sputum. These may be signs of pneumonia.  You develop yellow or brown nasal discharge or pain in the face, especially when you bend forward. These may be signs of sinusitis.  You develop a fever, swollen neck glands, pain with swallowing, or white areas in the back of your throat. These may be signs of strep throat. SEEK IMMEDIATE MEDICAL CARE IF:   You have a fever.  You develop severe or persistent headache, ear pain, sinus pain, or chest pain.  You develop wheezing, a prolonged cough, cough up blood, or have a change in your usual mucus (if you have chronic lung disease).  You develop sore muscles or a stiff neck. Document Released: 03/14/2001 Document Revised: 12/11/2011 Document Reviewed: 12/24/2013 [facility] Patient Information 2015 [place], [place]. This information is not intended to replace advice given to you by your health care provider. Make sure you discuss any questions you have with your health care provider.

## 2014-12-30 NOTE — Progress Notes (Signed)
Pre visit review using our clinic review tool, if applicable. No additional management support is needed unless otherwise documented below in the visit note. 

## 2014-12-30 NOTE — Progress Notes (Signed)
Subjective:    Patient ID: Melanie Mcdonald, female    DOB: [birth year], 81 y.o.   MRN: [id number]  HPI  81 year old white female, nonsmoker with a history of type 2 diabetes, obesity, congestive heart failure is in today with complaints of sore throat, cough, congestion and sinus drainage 1-1/2 days. Has not taken any medication over-the-counter. Was on Levaquin one month ago for a sinus infection. Denies any fever or chills.  Review of Systems  Constitutional: Negative.   HENT: Positive for congestion, postnasal drip and rhinorrhea.   Respiratory: Negative.   Cardiovascular: Negative.   Musculoskeletal: Negative.   Skin: Negative.   Allergic/Immunologic: Negative.   Neurological: Negative.   Psychiatric/Behavioral: Negative.      Past Medical History  Diagnosis Date  . ANEMIA 01/30/2008  . ARTHRITIS 12/11/2007  . BACK PAIN, CHRONIC 06/23/2008  . BREAST CYST, RIGHT 12/17/2007  . CONTACT DERMATITIS 03/10/2009  . CORONARY ARTERY DISEASE 03/01/2007    had a normal Myoview stress test 07-13-11  . CYSTOCELE WITHOUT MENTION UTERINE PROLAPSE LAT 09/12/2010  . DEGENERATIVE JOINT DISEASE 08/07/2007  . DEPRESSION 03/01/2007  . Esophageal reflux 11/06/2007  . HYPERKERATOSIS 06/02/2009  . HYPERLIPIDEMIA 08/26/2008  . HYPERTENSION 03/01/2007  . Irritable bowel syndrome 01/26/2009  . LABYRINTHITIS 11/03/2009  . Narcolepsy without cataplexy 08/26/2008  . OBESITY 03/01/2007  . PERIPHERAL VASCULAR DISEASE 01/26/2009  . SYNCOPE 08/26/2008    had brain MRI on 06-28-12 showing only chronic microvascular ischemia and atrophy   . TRANSIENT ISCHEMIC ATTACK 10/20/2009    had normal brain MRA with patent vertebrals and carotids 06-28-12  . NEPHROLITHIASIS 04/29/2009  . COPD (chronic obstructive pulmonary disease)   . Colon polyps     FRAGMENTS OF HYPERPLASTIC POLYP  . Diverticulosis of colon (without mention of hemorrhage)   . DIABETES MELLITUS, TYPE II 08/26/2008    sees Dr. Dwyane Dee     History   Social  History  . Marital Status: Widowed    Spouse Name: N/A  . Number of Children: N/A  . Years of Education: N/A   Occupational History  . Not on file.   Social History Main Topics  . Smoking status: Never Smoker   . Smokeless tobacco: Never Used  . Alcohol Use: No  . Drug Use: No  . Sexual Activity: No   Other Topics Concern  . Not on file   Social History Narrative    Past Surgical History  Procedure Laterality Date  . Cataract extraction    . Abdominal hysterectomy    . Back surgery      x2  . Tonsilectomy, adenoidectomy, bilateral myringotomy and tubes    . Spine surgery      x 3  . Cardiac catheterization  07/2009  . Intraocular lens insertion    . Esophagogastroduodenoscopy  11-13-07    per Dr. Deatra Ina, normal   . Colonoscopy  11-13-07    per Dr. Deatra Ina, benign polyps, repeat in 5 yrs    Family History  Problem Relation Age of Onset  . Lupus Mother   . COPD Father     Allergies  Allergen Reactions  . Aspirin Other (See Comments)    Abdominal pain  . Clarithromycin     Made stomach burn  . Doxycycline Itching  . Erythromycin Nausea And Vomiting  . Metolazone     Pt stated this made her BP drop  . Other     Pt cannot have any of the MYCIN's  . Penicillins Swelling  .  Sulfonamide Derivatives Nausea And Vomiting    Current Outpatient Prescriptions on File Prior to Visit  Medication Sig Dispense Refill  . albuterol (PROVENTIL HFA;VENTOLIN HFA) 108 (90 BASE) MCG/ACT inhaler Inhale 2 puffs into the lungs every 4 (four) hours as needed for wheezing or shortness of breath. 1 Inhaler 3  . BAYER MICROLET LANCETS lancets Test 1-3 times per day and diagnosis code is E 11.9 100 each 3  . buPROPion (WELLBUTRIN XL) 150 MG 24 hr tablet Take 1 tablet (150 mg total) by mouth daily. 90 tablet 3  . cyclobenzaprine (FLEXERIL) 10 MG tablet Take 1 tablet (10 mg total) by mouth 3 (three) times daily as needed for muscle spasms. 90 tablet 5  . diclofenac sodium (VOLTAREN) 1 %  GEL Apply 4 g topically 4 (four) times daily as needed (for arthritis pain in shoulders and fingers). For joint pain.    Marland Kitchen Fluticasone-Salmeterol (ADVAIR DISKUS) 250-50 MCG/DOSE AEPB Inhale 1 puff into the lungs 2 (two) times daily. 60 each 6  . furosemide (LASIX) 40 MG tablet Take 1 tablet (40 mg total) by mouth 2 (two) times daily. TAKE 1 IN THE MORNING AND ONE IN THE EVENING 90 tablet 3  . glucose blood (ACCU-CHEK AVIVA PLUS) test strip Test 1-3 times per day and diagnosis code is E 11.9 100 each 3  . Insulin Aspart Prot & Aspart (NOVOLOG 70/30 MIX) (70-30) 100 UNIT/ML Pen Take 25 units with breakfast, 15 units with lunch and 25 units with dinner 45 mL 0  . Insulin Glargine (LANTUS SOLOSTAR) 100 UNIT/ML Solostar Pen INJECT 100 UNITS UNDER THE SKIN AT BEDTIME and inject 50 units in the morning 135 mL 3  . Insulin Pen Needle (NOVOFINE) 32G X 6 MM MISC Use 4 times per day and diagnosis code is E 11.9 300 each 3  . ketoconazole (NIZORAL) 2 % cream Apply 1 application topically 2 (two) times daily as needed for irritation. 60 g 5  . meclizine (ANTIVERT) 25 MG tablet Take 1 tablet (25 mg total) by mouth every 4 (four) hours as needed for dizziness. 300 tablet 3  . nitroGLYCERIN (NITROSTAT) 0.4 MG SL tablet Place 1 tablet (0.4 mg total) under the tongue every 5 (five) minutes as needed for chest pain. 50 tablet 5  . oxyCODONE-acetaminophen (PERCOCET) 10-325 MG per tablet Take 1 tablet by mouth every 6 (six) hours as needed for pain. 120 tablet 0  . phenazopyridine (PYRIDIUM) 200 MG tablet Take 200 mg by mouth 3 (three) times daily as needed for pain.    . pramipexole (MIRAPEX) 1 MG tablet Take 6 tablets (6 mg total) by mouth daily. 180 tablet 5  . promethazine (PHENERGAN) 25 MG tablet Take 1 tablet (25 mg total) by mouth every 6 (six) hours as needed for nausea (nausea). For nausea. 60 tablet 5  . zolpidem (AMBIEN) 10 MG tablet Take 1 tablet (10 mg total) by mouth at bedtime. 30 tablet 5  .  HYDROcodone-homatropine (HYDROMET) 5-1.5 MG/5ML syrup Take 5 mLs by mouth every 4 (four) hours as needed. (Patient not taking: Reported on 12/30/2014) 240 mL 0  . metolazone (ZAROXOLYN) 2.5 MG tablet Take 1 tablet (2.5 mg total) by mouth daily. TAKE THIS 30 MINUTES PRIOR TO YOUR MORNING DOSE OF LASIX (Patient not taking: Reported on 12/30/2014) 90 tablet 3  . metoprolol succinate (TOPROL-XL) 25 MG 24 hr tablet Take 1 tablet (25 mg total) by mouth daily. (Patient not taking: Reported on 12/30/2014) 30 tablet 3   No current  facility-administered medications on file prior to visit.    BP 140/70 mmHg  Pulse 87  Temp(Src) 98.3 F (36.8 C) (Oral)  Wt 248 lb 9.6 oz (112.764 kg)chart    Objective:   Physical Exam  Constitutional: She is oriented to person, place, and time. She appears well-developed and well-nourished.  HENT:  Right Ear: External ear normal.  Nose: Nose normal.  Mouth/Throat: Oropharynx is clear and moist.  Neck: Normal range of motion. Neck supple.  Cardiovascular: Normal rate, regular rhythm and normal heart sounds.   Pulmonary/Chest: Effort normal and breath sounds normal.  Musculoskeletal: Normal range of motion.  Neurological: She is alert and oriented to person, place, and time.  Skin: Skin is warm and dry.  Psychiatric: She has a normal mood and affect.          Assessment & Plan:  Melanie Mcdonald was seen today for uri.  Diagnoses and all orders for this visit:  Acute upper respiratory infection  Cough  Other orders -     fluticasone (FLONASE) 50 MCG/ACT nasal spray; Place 2 sprays into both nostrils daily.   Zyrtec 10 mg once daily. Flonase 2 sprays in his nostril once a day. Call the office with any questions or concerns.

## 2015-01-12 ENCOUNTER — Encounter (HOSPITAL_BASED_OUTPATIENT_CLINIC_OR_DEPARTMENT_OTHER): Payer: Medicare Other

## 2015-01-22 ENCOUNTER — Encounter: Payer: Self-pay | Admitting: Family Medicine

## 2015-01-22 ENCOUNTER — Ambulatory Visit (INDEPENDENT_AMBULATORY_CARE_PROVIDER_SITE_OTHER): Payer: Medicare Other | Admitting: Family Medicine

## 2015-01-22 VITALS — BP 159/85 | HR 89 | Temp 98.1°F

## 2015-01-22 DIAGNOSIS — J0100 Acute maxillary sinusitis, unspecified: Secondary | ICD-10-CM | POA: Diagnosis not present

## 2015-01-22 MED ORDER — LEVOFLOXACIN 500 MG PO TABS: 500.0000 mg | ORAL_TABLET | Freq: Every day | ORAL | Status: AC

## 2015-01-22 MED ORDER — OXYCODONE-ACETAMINOPHEN 10-325 MG PO TABS: 1.0000 | ORAL_TABLET | Freq: Four times a day (QID) | ORAL | Status: DC | PRN

## 2015-01-22 MED ORDER — ONDANSETRON HCL 8 MG PO TABS
8.0000 mg | ORAL_TABLET | Freq: Three times a day (TID) | ORAL | Status: DC | PRN
Start: 2015-01-22 — End: 2015-05-20

## 2015-01-22 MED ORDER — HYDROCODONE-HOMATROPINE 5-1.5 MG/5ML PO SYRP: 5.0000 mL | ORAL_SOLUTION | ORAL | Status: DC | PRN

## 2015-01-22 MED ORDER — METHYLPREDNISOLONE ACETATE 80 MG/ML IJ SUSP
120.0000 mg | Freq: Once | INTRAMUSCULAR | Status: AC
  Administered 2015-01-22: 120 mg via INTRAMUSCULAR

## 2015-01-22 NOTE — Addendum Note (Signed)
Addended by: Patience Musca F on: 01/22/2015 05:00 PM   Modules accepted: Orders

## 2015-01-22 NOTE — Progress Notes (Signed)
Pre visit review using our clinic review tool, if applicable. No additional management support is needed unless otherwise documented below in the visit note. Pt declined to weigh 

## 2015-01-22 NOTE — Progress Notes (Signed)
   Subjective:    Patient ID: Melanie Mcdonald, female    DOB: [birth year], 81 y.o.   MRN: [id number]  HPI Here for 3 weeks of sinus pressure, PND, and coughing up green sputum. She has a lot of nausea but no vomiting. Phenergan does not work as well as it used to.    Review of Systems  Constitutional: Negative.   HENT: Positive for congestion, postnasal drip and sinus pressure.   Eyes: Negative.   Respiratory: Positive for cough and shortness of breath. Negative for wheezing.   Cardiovascular: Negative.   Gastrointestinal: Positive for nausea. Negative for vomiting, abdominal pain, diarrhea, constipation and abdominal distention.       Objective:   Physical Exam  Constitutional: She appears well-developed and well-nourished.  HENT:  Right Ear: External ear normal.  Left Ear: External ear normal.  Nose: Nose normal.  Mouth/Throat: Oropharynx is clear and moist.  Eyes: Conjunctivae are normal.  Cardiovascular: Normal rate, regular rhythm, normal heart sounds and intact distal pulses.   Pulmonary/Chest: Effort normal and breath sounds normal.  Abdominal: Soft. Bowel sounds are normal. She exhibits no distension and no mass. There is no tenderness. There is no rebound and no guarding.  Lymphadenopathy:    She has no cervical adenopathy.          Assessment & Plan:  Melanie Mcdonald has sinusitis and she seems to be swallowing some of the mucus. Given Levaquin and a steroid shot. Try Zofran for the nausea.

## 2015-02-06 ENCOUNTER — Ambulatory Visit: Payer: Medicare Other | Admitting: Family Medicine

## 2015-02-06 ENCOUNTER — Encounter: Payer: Self-pay | Admitting: Family Medicine

## 2015-02-06 VITALS — BP 150/80 | HR 84 | Temp 98.3°F | Resp 20 | Ht 67.5 in | Wt 250.5 lb

## 2015-02-06 DIAGNOSIS — R300 Dysuria: Secondary | ICD-10-CM

## 2015-02-06 DIAGNOSIS — R350 Frequency of micturition: Secondary | ICD-10-CM

## 2015-02-06 NOTE — Progress Notes (Signed)
Pre visit review using our clinic review tool, if applicable. No additional management support is needed unless otherwise documented below in the visit note.  1130 Pt walked out, said she could not wait any longer had things to do.

## 2015-02-15 ENCOUNTER — Telehealth: Payer: Self-pay | Admitting: Family Medicine

## 2015-02-15 NOTE — Telephone Encounter (Signed)
Melanie Mcdonald came in today (02/15/2015) and dropped a letter from [facility] about zolpidem (AMBIEN) 10 MG tablet needing the doctor to call and provide necessary information. I have put the letter in the Dr's Folder.

## 2015-02-16 NOTE — Telephone Encounter (Signed)
I have not seen such a letter. Can you find out about this please

## 2015-02-17 NOTE — Telephone Encounter (Signed)
Pt needs script printed and then fax to [facility].

## 2015-02-17 NOTE — Telephone Encounter (Signed)
I spoke with Melanie Mcdonald and we have not received letter yet for prior authorization yet.

## 2015-02-17 NOTE — Telephone Encounter (Signed)
I left a voice message for pt to return my call, does she want a new script to go to [facility]? If so then we need to print and fax a script.

## 2015-02-19 MED ORDER — ZOLPIDEM TARTRATE 10 MG PO TABS: 10.0000 mg | ORAL_TABLET | Freq: Every day | ORAL | Status: DC

## 2015-02-19 NOTE — Telephone Encounter (Signed)
done

## 2015-02-19 NOTE — Telephone Encounter (Signed)
I faxed script 

## 2015-03-04 ENCOUNTER — Other Ambulatory Visit: Payer: Self-pay | Admitting: Family Medicine

## 2015-03-22 ENCOUNTER — Encounter: Payer: Self-pay | Admitting: Gastroenterology

## 2015-03-30 ENCOUNTER — Other Ambulatory Visit: Payer: Self-pay | Admitting: Family Medicine

## 2015-03-30 NOTE — Telephone Encounter (Signed)
Pt need new rx dextroamphetamine 10 mg and percocet 10-325 mg  and refill call into [facility] pramipexole 1 mg #180 w/refills

## 2015-03-31 ENCOUNTER — Other Ambulatory Visit: Payer: Self-pay | Admitting: *Deleted

## 2015-03-31 MED ORDER — OXYCODONE-ACETAMINOPHEN 10-325 MG PO TABS: 1.0000 | ORAL_TABLET | Freq: Four times a day (QID) | ORAL | Status: DC | PRN

## 2015-03-31 MED ORDER — PRAMIPEXOLE DIHYDROCHLORIDE 1 MG PO TABS: 6.0000 mg | ORAL_TABLET | Freq: Every day | ORAL | Status: DC

## 2015-03-31 MED ORDER — PRAMIPEXOLE DIHYDROCHLORIDE 1 MG PO TABS: 1.0000 mg | ORAL_TABLET | Freq: Three times a day (TID) | ORAL | Status: DC

## 2015-03-31 MED ORDER — DEXTROAMPHETAMINE SULFATE ER 10 MG PO CP24: 20.0000 mg | ORAL_CAPSULE | Freq: Every day | ORAL | Status: DC

## 2015-03-31 NOTE — Telephone Encounter (Signed)
Call in Pramipexole #180 with 3 rf. The other rx are ready to be picked up

## 2015-03-31 NOTE — Telephone Encounter (Signed)
Rx re-sent as previous Rx was sent with incorrect instructions to take TID.

## 2015-03-31 NOTE — Telephone Encounter (Signed)
Rx done. 

## 2015-05-10 ENCOUNTER — Ambulatory Visit (INDEPENDENT_AMBULATORY_CARE_PROVIDER_SITE_OTHER): Payer: Medicare Other | Admitting: Internal Medicine

## 2015-05-10 ENCOUNTER — Telehealth: Payer: Self-pay | Admitting: Family Medicine

## 2015-05-10 ENCOUNTER — Encounter: Payer: Self-pay | Admitting: Internal Medicine

## 2015-05-10 VITALS — BP 122/70 | HR 101 | Temp 98.8°F | Resp 20 | Ht 67.5 in | Wt 245.0 lb

## 2015-05-10 DIAGNOSIS — E118 Type 2 diabetes mellitus with unspecified complications: Secondary | ICD-10-CM | POA: Diagnosis not present

## 2015-05-10 DIAGNOSIS — R531 Weakness: Secondary | ICD-10-CM | POA: Diagnosis not present

## 2015-05-10 DIAGNOSIS — R300 Dysuria: Secondary | ICD-10-CM

## 2015-05-10 DIAGNOSIS — R51 Headache: Secondary | ICD-10-CM

## 2015-05-10 DIAGNOSIS — G8929 Other chronic pain: Secondary | ICD-10-CM

## 2015-05-10 DIAGNOSIS — R2681 Unsteadiness on feet: Secondary | ICD-10-CM | POA: Diagnosis not present

## 2015-05-10 DIAGNOSIS — I10 Essential (primary) hypertension: Secondary | ICD-10-CM

## 2015-05-10 DIAGNOSIS — H9209 Otalgia, unspecified ear: Secondary | ICD-10-CM

## 2015-05-10 LAB — POCT URINALYSIS DIPSTICK
Nitrite, UA: POSITIVE
PH UA: 5
SPEC GRAV UA: 1.02
Urobilinogen, UA: >=8

## 2015-05-10 MED ORDER — CIPROFLOXACIN HCL 500 MG PO TABS
500.0000 mg | ORAL_TABLET | Freq: Two times a day (BID) | ORAL | Status: DC
Start: 2015-05-10 — End: 2015-05-11

## 2015-05-10 NOTE — Progress Notes (Signed)
Pre visit review using our clinic review tool, if applicable. No additional management support is needed unless otherwise documented below in the visit note. 

## 2015-05-10 NOTE — Progress Notes (Signed)
Subjective:    Patient ID: Melanie Mcdonald, female    DOB: Apr 19, 1933, 81 y.o.   MRN: [id number]  HPI  81 year old patient who is seen today with multiple complaints.  She complains of dizziness and balance issues which has been chronic.  She has been evaluated by neurology and evaluation has included brain MRI and MRA. She also complains of dysuria.  Urinalysis was reviewed and did reveal significant pyuria She also describes right ear pain.  On closer questioning, it seems be more in the right posterior neck area.  She does have a history of significant cervical DJD. She has diabetes but no longer is followed by endocrinology.  No hemoglobin A1c in over one year  Lab Results  Component Value Date   HGBA1C 9.7* 12/10/2013    She is followed by cardiology.  She does have a history of anxiety, depression. No hypoglycemia.  Past Medical History  Diagnosis Date  . ANEMIA 01/30/2008  . ARTHRITIS 12/11/2007  . BACK PAIN, CHRONIC 06/23/2008  . BREAST CYST, RIGHT 12/17/2007  . CONTACT DERMATITIS 03/10/2009  . CORONARY ARTERY DISEASE 03/01/2007    had a normal Myoview stress test 07-13-11  . CYSTOCELE WITHOUT MENTION UTERINE PROLAPSE LAT 09/12/2010  . DEGENERATIVE JOINT DISEASE 08/07/2007  . DEPRESSION 03/01/2007  . Esophageal reflux 11/06/2007  . HYPERKERATOSIS 06/02/2009  . HYPERLIPIDEMIA 08/26/2008  . HYPERTENSION 03/01/2007  . Irritable bowel syndrome 01/26/2009  . LABYRINTHITIS 11/03/2009  . Narcolepsy without cataplexy 08/26/2008  . OBESITY 03/01/2007  . PERIPHERAL VASCULAR DISEASE 01/26/2009  . SYNCOPE 08/26/2008    had brain MRI on 06-28-12 showing only chronic microvascular ischemia and atrophy   . TRANSIENT ISCHEMIC ATTACK 10/20/2009    had normal brain MRA with patent vertebrals and carotids 06-28-12  . NEPHROLITHIASIS 04/29/2009  . COPD (chronic obstructive pulmonary disease)   . Colon polyps     FRAGMENTS OF HYPERPLASTIC POLYP  . Diverticulosis of colon (without mention of hemorrhage)    . DIABETES MELLITUS, TYPE II 08/26/2008    sees Dr. Dwyane Dee     History   Social History  . Marital Status: Widowed    Spouse Name: N/A  . Number of Children: N/A  . Years of Education: N/A   Occupational History  . Not on file.   Social History Main Topics  . Smoking status: Never Smoker   . Smokeless tobacco: Never Used  . Alcohol Use: No  . Drug Use: No  . Sexual Activity: No   Other Topics Concern  . Not on file   Social History Narrative    Past Surgical History  Procedure Laterality Date  . Cataract extraction    . Abdominal hysterectomy    . Back surgery      x2  . Tonsilectomy, adenoidectomy, bilateral myringotomy and tubes    . Spine surgery      x 3  . Cardiac catheterization  07/2009  . Intraocular lens insertion    . Esophagogastroduodenoscopy  11-13-07    per Dr. Deatra Ina, normal   . Colonoscopy  11-13-07    per Dr. Deatra Ina, benign polyps, repeat in 5 yrs    Family History  Problem Relation Age of Onset  . Lupus Mother   . COPD Father     Allergies  Allergen Reactions  . Aspirin Other (See Comments)    Abdominal pain  . Clarithromycin     Made stomach burn  . Doxycycline Itching  . Erythromycin Nausea And Vomiting  . Metolazone  Pt stated this made her BP drop  . Other     Pt cannot have any of the MYCIN's  . Penicillins Swelling  . Sulfonamide Derivatives Nausea And Vomiting    Current Outpatient Prescriptions on File Prior to Visit  Medication Sig Dispense Refill  . albuterol (PROVENTIL HFA;VENTOLIN HFA) 108 (90 BASE) MCG/ACT inhaler Inhale 2 puffs into the lungs every 4 (four) hours as needed for wheezing or shortness of breath. 1 Inhaler 3  . BAYER MICROLET LANCETS lancets Test 1-3 times per day and diagnosis code is E 11.9 100 each 3  . buPROPion (WELLBUTRIN XL) 150 MG 24 hr tablet Take 1 tablet (150 mg total) by mouth daily. (Patient taking differently: Take 150 mg by mouth as needed. ) 90 tablet 3  . cyclobenzaprine (FLEXERIL)  10 MG tablet Take 1 tablet (10 mg total) by mouth 3 (three) times daily as needed for muscle spasms. 90 tablet 5  . diclofenac sodium (VOLTAREN) 1 % GEL Apply 4 g topically 4 (four) times daily as needed (for arthritis pain in shoulders and fingers). For joint pain.    . fluticasone (FLONASE) 50 MCG/ACT nasal spray Place 2 sprays into both nostrils daily. 16 g 1  . Fluticasone-Salmeterol (ADVAIR DISKUS) 250-50 MCG/DOSE AEPB Inhale 1 puff into the lungs 2 (two) times daily. 60 each 6  . furosemide (LASIX) 40 MG tablet Take 1 tablet (40 mg total) by mouth 2 (two) times daily. TAKE 1 IN THE MORNING AND ONE IN THE EVENING 90 tablet 3  . glucose blood (ACCU-CHEK AVIVA PLUS) test strip Test 1-3 times per day and diagnosis code is E 11.9 100 each 3  . Insulin Aspart Prot & Aspart (NOVOLOG 70/30 MIX) (70-30) 100 UNIT/ML Pen Take 25 units with breakfast, 15 units with lunch and 25 units with dinner 45 mL 0  . Insulin Glargine (LANTUS SOLOSTAR) 100 UNIT/ML Solostar Pen INJECT 100 UNITS UNDER THE SKIN AT BEDTIME and inject 50 units in the morning 135 mL 3  . Insulin Pen Needle (NOVOFINE) 32G X 6 MM MISC Use 4 times per day and diagnosis code is E 11.9 300 each 3  . ketoconazole (NIZORAL) 2 % cream Apply 1 application topically 2 (two) times daily as needed for irritation. 60 g 5  . meclizine (ANTIVERT) 25 MG tablet Take 1 tablet (25 mg total) by mouth every 4 (four) hours as needed for dizziness. 300 tablet 3  . nitroGLYCERIN (NITROSTAT) 0.4 MG SL tablet Place 1 tablet (0.4 mg total) under the tongue every 5 (five) minutes as needed for chest pain. 50 tablet 5  . ondansetron (ZOFRAN) 8 MG tablet Take 1 tablet (8 mg total) by mouth every 8 (eight) hours as needed for nausea or vomiting. 60 tablet 5  . oxyCODONE-acetaminophen (PERCOCET) 10-325 MG per tablet Take 1 tablet by mouth every 6 (six) hours as needed for pain. 120 tablet 0  . phenazopyridine (PYRIDIUM) 200 MG tablet Take 200 mg by mouth 3 (three) times  daily as needed for pain.    . pramipexole (MIRAPEX) 1 MG tablet Take 6 tablets (6 mg total) by mouth daily. 180 tablet 0  . zolpidem (AMBIEN) 10 MG tablet Take 1 tablet (10 mg total) by mouth at bedtime. 90 tablet 1  . metolazone (ZAROXOLYN) 2.5 MG tablet Take 1 tablet (2.5 mg total) by mouth daily. TAKE THIS 30 MINUTES PRIOR TO YOUR MORNING DOSE OF LASIX (Patient not taking: Reported on 05/10/2015) 90 tablet 3  . metoprolol succinate (  TOPROL-XL) 25 MG 24 hr tablet Take 1 tablet (25 mg total) by mouth daily. (Patient not taking: Reported on 05/10/2015) 30 tablet 3   No current facility-administered medications on file prior to visit.    BP 122/70 mmHg  Pulse 101  Temp(Src) 98.8 F (37.1 C) (Oral)  Resp 20  Ht 5' 7.5" (1.715 m)  Wt 245 lb (111.131 kg)  BMI 37.78 kg/m2  SpO2 95%     Review of Systems  Constitutional: Positive for fatigue.  HENT: Negative for congestion, dental problem, hearing loss, rhinorrhea, sinus pressure, sore throat and tinnitus.   Eyes: Negative for pain, discharge and visual disturbance.  Respiratory: Negative for cough and shortness of breath.   Cardiovascular: Negative for chest pain, palpitations and leg swelling.  Gastrointestinal: Negative for nausea, vomiting, abdominal pain, diarrhea, constipation, blood in stool and abdominal distention.  Genitourinary: Positive for dysuria and frequency. Negative for urgency, hematuria, flank pain, vaginal bleeding, vaginal discharge, difficulty urinating, vaginal pain and pelvic pain.  Musculoskeletal: Positive for arthralgias, gait problem, neck pain and neck stiffness. Negative for joint swelling.  Skin: Negative for rash.  Neurological: Positive for dizziness and light-headedness. Negative for syncope, speech difficulty, weakness, numbness and headaches.  Hematological: Negative for adenopathy.  Psychiatric/Behavioral: Negative for behavioral problems, dysphoric mood and agitation. The patient is not nervous/anxious.         Objective:   Physical Exam  Constitutional: She appears well-developed and well-nourished. No distress.  Obese Wheelchair bound No distress Blood pressure 120/70 Afebrile O2 saturation 95%  HENT:  Mouth/Throat: Oropharynx is clear and moist.  Tympanic membranes were normal  Neck: Neck supple.  Cardiovascular: Normal rate and regular rhythm.   Pulmonary/Chest: Effort normal. No respiratory distress.  Abdominal: Soft. Bowel sounds are normal.          Assessment & Plan:   Acute urinary tract infection.  Will treat with Cipro 500 mg twice daily for 3 days Diabetes mellitus.  No recent follow-up.  We'll check hemoglobin A1c and lab and schedule prompt follow-up with PCP Osteoarthritis Essential hypertension Gait abnormality Chronic dizziness  Cipro 500 twice a day Check hemoglobin A1c and screening lab Follow-up PCP in one week

## 2015-05-10 NOTE — Telephone Encounter (Signed)
Patient Name: Melanie Mcdonald DOB: 13-Mar-1933 Initial Comment Caller states his mother is dizzy, weak, bladder Nurse Assessment Nurse: Ronnald Ramp, RN, Miranda Date/Time (Eastern Time): 05/10/2015 1:04:49 PM Confirm and document reason for call. If symptomatic, describe symptoms. ---Spoke with pt, states she believes she has a bladder infection. Having burning and pain when urinating, also with urinary frequency. Also having headaches, dizziness, and weakness for 1 week. Also with right ear pain. Has the patient traveled out of the country within the last 30 days? ---Not Applicable Does the patient require triage? ---Yes Related visit to physician within the last 2 weeks? ---No Does the PT have any chronic conditions? (i.e. diabetes, asthma, etc.) ---Yes List chronic conditions. ---CHF, Diabetes, Chronic pain, Asthma, Guidelines Guideline Title Affirmed Question Affirmed Notes Urination Pain - Female Diabetes mellitus or weak immune system (e.g., HIV positive, cancer chemotherapy, transplant patient) Final Disposition User See Physician within 4 Hours (or PCP triage) Ronnald Ramp, RN, Miranda Comments Appt scheduled today at 3:45pm with Dr, Audelia Hives Referrals REFERRED TO PCP OFFICE Disagree/Comply: Comply

## 2015-05-10 NOTE — Patient Instructions (Signed)
Follow-up with Dr. Sarajane Jews in one week  Take your antibiotic as prescribed until ALL of it is gone, but stop if you develop a rash, swelling, or any side effects of the medication.  Contact our office as soon as possible if  there are side effects of the medication.   Please check your hemoglobin A1c every 3 months

## 2015-05-11 ENCOUNTER — Telehealth: Payer: Self-pay | Admitting: Family Medicine

## 2015-05-11 LAB — CBC WITH DIFFERENTIAL/PLATELET
Basophils Absolute: 0.1 10*3/uL (ref 0.0–0.1)
Basophils Relative: 1 % (ref 0.0–3.0)
Eosinophils Absolute: 0.3 10*3/uL (ref 0.0–0.7)
Eosinophils Relative: 2.5 % (ref 0.0–5.0)
HEMATOCRIT: 41 % (ref 36.0–46.0)
HEMOGLOBIN: 13.4 g/dL (ref 12.0–15.0)
LYMPHS ABS: 2.9 10*3/uL (ref 0.7–4.0)
Lymphocytes Relative: 26.8 % (ref 12.0–46.0)
MCHC: 32.5 g/dL (ref 30.0–36.0)
MCV: 88.5 fl (ref 78.0–100.0)
Monocytes Absolute: 0.4 10*3/uL (ref 0.1–1.0)
Monocytes Relative: 3.6 % (ref 3.0–12.0)
NEUTROS ABS: 7.2 10*3/uL (ref 1.4–7.7)
Neutrophils Relative %: 66.1 % (ref 43.0–77.0)
PLATELETS: 239 10*3/uL (ref 150.0–400.0)
RBC: 4.64 Mil/uL (ref 3.87–5.11)
RDW: 14.6 % (ref 11.5–15.5)
WBC: 10.8 10*3/uL — AB (ref 4.0–10.5)

## 2015-05-11 LAB — TSH: TSH: 1.56 u[IU]/mL (ref 0.35–4.50)

## 2015-05-11 LAB — COMPREHENSIVE METABOLIC PANEL
ALBUMIN: 3.7 g/dL (ref 3.5–5.2)
ALT: 13 U/L (ref 0–35)
AST: 16 U/L (ref 0–37)
Alkaline Phosphatase: 71 U/L (ref 39–117)
BUN: 18 mg/dL (ref 6–23)
CALCIUM: 9.4 mg/dL (ref 8.4–10.5)
CO2: 32 meq/L (ref 19–32)
Chloride: 102 mEq/L (ref 96–112)
Creatinine, Ser: 0.96 mg/dL (ref 0.40–1.20)
GFR: 59.47 mL/min — ABNORMAL LOW (ref >60.00)
GLUCOSE: 166 mg/dL — AB (ref 70–99)
Potassium: 4.6 mEq/L (ref 3.5–5.1)
Sodium: 140 mEq/L (ref 135–145)
Total Bilirubin: 0.3 mg/dL (ref 0.2–1.2)
Total Protein: 7.5 g/dL (ref 6.0–8.3)

## 2015-05-11 LAB — HEMOGLOBIN A1C: HEMOGLOBIN A1C: 8.4 % — AB (ref 4.6–6.5)

## 2015-05-11 MED ORDER — CEPHALEXIN 500 MG PO CAPS: 500.0000 mg | ORAL_CAPSULE | Freq: Three times a day (TID) | ORAL | Status: DC

## 2015-05-11 NOTE — Telephone Encounter (Signed)
Patient states the RX Cipro is making her sick on the stomach and burn in her stomach, and took this medication on a full stomach. Please give patient a call

## 2015-05-11 NOTE — Telephone Encounter (Signed)
Called patient and discussed Cipro discontinued.  She states that she has not been able tolerate this medication in the past.  Also Please call in a new prescription for generic cephalexin 500 #20 one 3 times a day

## 2015-05-11 NOTE — Telephone Encounter (Signed)
I spoke with pt, added Cipro to her drug allergy list and send script e-scribe.

## 2015-05-19 ENCOUNTER — Other Ambulatory Visit: Payer: Self-pay | Admitting: Family Medicine

## 2015-05-19 NOTE — Telephone Encounter (Signed)
Refill for 6 months. 

## 2015-05-20 ENCOUNTER — Encounter: Payer: Self-pay | Admitting: Family Medicine

## 2015-05-20 ENCOUNTER — Ambulatory Visit (INDEPENDENT_AMBULATORY_CARE_PROVIDER_SITE_OTHER): Payer: Medicare Other | Admitting: Family Medicine

## 2015-05-20 VITALS — BP 138/80 | HR 92 | Temp 98.3°F | Ht 67.5 in | Wt 245.0 lb

## 2015-05-20 DIAGNOSIS — M159 Polyosteoarthritis, unspecified: Secondary | ICD-10-CM

## 2015-05-20 DIAGNOSIS — M150 Primary generalized (osteo)arthritis: Secondary | ICD-10-CM

## 2015-05-20 DIAGNOSIS — N390 Urinary tract infection, site not specified: Secondary | ICD-10-CM

## 2015-05-20 MED ORDER — ONDANSETRON HCL 8 MG PO TABS: 8.0000 mg | ORAL_TABLET | Freq: Three times a day (TID) | ORAL | Status: DC | PRN

## 2015-05-20 MED ORDER — NITROFURANTOIN MONOHYD MACRO 100 MG PO CAPS: 100.0000 mg | ORAL_CAPSULE | Freq: Two times a day (BID) | ORAL | Status: DC

## 2015-05-20 MED ORDER — PRAMIPEXOLE DIHYDROCHLORIDE 1 MG PO TABS
6.0000 mg | ORAL_TABLET | Freq: Every day | ORAL | Status: DC
Start: 2015-05-20 — End: 2015-11-29

## 2015-05-20 MED ORDER — OXYCODONE-ACETAMINOPHEN 10-325 MG PO TABS: 1.0000 | ORAL_TABLET | Freq: Four times a day (QID) | ORAL | Status: DC | PRN

## 2015-05-20 MED ORDER — GABAPENTIN 300 MG PO CAPS: 300.0000 mg | ORAL_CAPSULE | Freq: Three times a day (TID) | ORAL | Status: DC

## 2015-05-20 MED ORDER — ZOLPIDEM TARTRATE 10 MG PO TABS: 10.0000 mg | ORAL_TABLET | Freq: Every day | ORAL | Status: DC

## 2015-05-20 NOTE — Progress Notes (Signed)
Pre visit review using our clinic review tool, if applicable. No additional management support is needed unless otherwise documented below in the visit note. 

## 2015-05-21 ENCOUNTER — Encounter: Payer: Self-pay | Admitting: Family Medicine

## 2015-05-21 NOTE — Progress Notes (Signed)
   Subjective:    Patient ID: Melanie Mcdonald, female    DOB: [birth year], 81 y.o.   MRN: [id number]  HPI Here to follow up on a UTI and for chronic low back pain with generalized joint pains. She was seen here on 05-10-15 for a UTI and was treated with Cipro. However she had to stop taking this after just 3 days due to abdominal cramps. She was then given Keflex which she finished up yesterday. She feels better but she thinks there is still a little infection present. She has pelvic fullness and some urgency. No burning or fever. Her arthritis is also flaring up and all her joints along with her spine are painful and stiff. She is using Percocet with partial relief.    Review of Systems  Constitutional: Negative.   Respiratory: Negative.   Cardiovascular: Negative.   Genitourinary: Positive for urgency and frequency. Negative for dysuria, hematuria and flank pain.  Musculoskeletal: Positive for back pain and arthralgias.       Objective:   Physical Exam  Constitutional: She appears well-developed and well-nourished.  Cardiovascular: Normal rate, regular rhythm and intact distal pulses.   Pulmonary/Chest: Effort normal and breath sounds normal.  Abdominal: Soft. Bowel sounds are normal. She exhibits no distension and no mass. There is no tenderness. There is no rebound and no guarding.          Assessment & Plan:  She has a partially treated UTI. Given 7 days of Macrobid. Try Gabapentin 300 mg TID for the arthritis pains.

## 2015-05-24 ENCOUNTER — Ambulatory Visit (INDEPENDENT_AMBULATORY_CARE_PROVIDER_SITE_OTHER): Payer: Medicare Other | Admitting: Family Medicine

## 2015-05-24 ENCOUNTER — Telehealth: Payer: Self-pay | Admitting: Family Medicine

## 2015-05-24 ENCOUNTER — Encounter: Payer: Self-pay | Admitting: Family Medicine

## 2015-05-24 VITALS — BP 152/79 | HR 89 | Temp 98.3°F | Ht 67.5 in | Wt 245.0 lb

## 2015-05-24 DIAGNOSIS — L259 Unspecified contact dermatitis, unspecified cause: Secondary | ICD-10-CM

## 2015-05-24 DIAGNOSIS — N3000 Acute cystitis without hematuria: Secondary | ICD-10-CM | POA: Diagnosis not present

## 2015-05-24 LAB — POCT URINALYSIS DIPSTICK
Bilirubin, UA: NEGATIVE
Blood, UA: NEGATIVE
Ketones, UA: NEGATIVE
Leukocytes, UA: NEGATIVE
Nitrite, UA: NEGATIVE
Protein, UA: NEGATIVE
SPEC GRAV UA: 1.02
UROBILINOGEN UA: 0.2
pH, UA: 6

## 2015-05-24 MED ORDER — METHYLPREDNISOLONE ACETATE 80 MG/ML IJ SUSP
160.0000 mg | Freq: Once | INTRAMUSCULAR | Status: AC
  Administered 2015-05-24: 160 mg via INTRAMUSCULAR

## 2015-05-24 NOTE — Progress Notes (Signed)
   Subjective:    Patient ID: EMAAN Mcdonald, female    DOB: [birth year], 81 y.o.   MRN: [id number]  HPI Here for widespread itching after exposure to bed bugs. She spent the night on a couch at the home of a friend over the weekend, and she awoke to diffuse itching. When she turned on the light she saw several bed bugs crawling on her skin. Since then she as been using Benadryl and she as washed all her clothes in hot water. Also she wants to check her urine again. She took a single dose of Macrobid last week and stopped it due to stomach cramps. Also she has had swelling in both legs for 2 days. No fever. She is taking her usual Lasix.    Review of Systems  Constitutional: Negative.   Respiratory: Negative.   Cardiovascular: Positive for leg swelling. Negative for chest pain and palpitations.  Gastrointestinal: Negative.   Genitourinary: Negative.   Skin: Positive for rash.       Objective:   Physical Exam  Constitutional: She appears well-developed and well-nourished.  Cardiovascular: Normal rate, regular rhythm, normal heart sounds and intact distal pulses.   Pulmonary/Chest: Effort normal and breath sounds normal.  Musculoskeletal:  3+ edema to both lower legs and feet, these are not tender   Skin:  Diffuse small red macular areas           Assessment & Plan:  For the bed bug bites she is given a steroid shot. For the leg swelling she will use Lasix and keep the legs elevated . Her urine is clear today.

## 2015-05-24 NOTE — Addendum Note (Signed)
Addended by: Aggie Hacker A on: 05/24/2015 10:47 AM   Modules accepted: Orders

## 2015-05-24 NOTE — Telephone Encounter (Signed)
scheduled pt appt

## 2015-05-24 NOTE — Progress Notes (Signed)
Pre visit review using our clinic review tool, if applicable. No additional management support is needed unless otherwise documented below in the visit note. 

## 2015-05-28 ENCOUNTER — Telehealth: Payer: Self-pay | Admitting: Family Medicine

## 2015-05-28 NOTE — Telephone Encounter (Signed)
She saw Dr. Ellouise Newer (Neurology) about this last year. Tell her to make another appt with her

## 2015-05-28 NOTE — Telephone Encounter (Signed)
Pt call to say that Dr Sarajane Jews was going to refer her to someone about her headaches. She call to ask if this has been done

## 2015-05-28 NOTE — Telephone Encounter (Signed)
S/w patient.

## 2015-06-08 ENCOUNTER — Ambulatory Visit: Payer: Medicare Other | Admitting: Neurology

## 2015-06-14 ENCOUNTER — Encounter: Payer: Self-pay | Admitting: Neurology

## 2015-06-14 ENCOUNTER — Ambulatory Visit (INDEPENDENT_AMBULATORY_CARE_PROVIDER_SITE_OTHER): Payer: Medicare Other | Admitting: Neurology

## 2015-06-14 VITALS — BP 132/78 | HR 92 | Resp 18 | Wt 253.0 lb

## 2015-06-14 DIAGNOSIS — R51 Headache: Secondary | ICD-10-CM

## 2015-06-14 DIAGNOSIS — R040 Epistaxis: Secondary | ICD-10-CM

## 2015-06-14 DIAGNOSIS — E0842 Diabetes mellitus due to underlying condition with diabetic polyneuropathy: Secondary | ICD-10-CM

## 2015-06-14 DIAGNOSIS — G4486 Cervicogenic headache: Secondary | ICD-10-CM | POA: Insufficient documentation

## 2015-06-14 DIAGNOSIS — R42 Dizziness and giddiness: Secondary | ICD-10-CM | POA: Diagnosis not present

## 2015-06-14 DIAGNOSIS — H9203 Otalgia, bilateral: Secondary | ICD-10-CM | POA: Diagnosis not present

## 2015-06-14 HISTORY — DX: Diabetes mellitus due to underlying condition with diabetic polyneuropathy: E08.42

## 2015-06-14 HISTORY — DX: Cervicogenic headache: G44.86

## 2015-06-14 MED ORDER — CYCLOBENZAPRINE HCL 10 MG PO TABS: 10.0000 mg | ORAL_TABLET | Freq: Every day | ORAL | Status: DC

## 2015-06-14 NOTE — Progress Notes (Signed)
NEUROLOGY FOLLOW UP OFFICE NOTE  Melanie Mcdonald [id number]  HISTORY OF PRESENT ILLNESS: I had the pleasure of seeing Melanie Mcdonald in follow-up in the neurology clinic on 06/14/2015.  The patient was last seen more than a year ago for dizziness, loss of consciousness, headaches, and memory problems. On her last visit, she reported the headaches had resolved and dizziness was occasional. She presents today due to worsening of headaches and dizziness. Headaches are again over the occipital regions, right greater than left. She is also having right-sided neck pain radiating down her right arm. She feels pain when she rubs the back of her neck on the right side, radiating to the frontal region. She takes oxycodone for the arthritis, which helps some with the headaches. The dizziness is described as "like the room is not quite steady," she loses her balance, and has had a few falls. She denies any hearing loss or tinnitus. She is concerned about frequent epistaxis, she feels there is something inside her nostril. She is also having bilateral ear pain, no ear discharge. She has chronic numbness and tingling in both feet. Recent HbA1c was 8.4. She reports doing 4-5 home PT sessions but has not followed through with home exercises. She feels her muscles are sore, with cramping in her upper thighs. She has a diagnosis of narcolepsy and reports that dextroamphetamine 20mg  during the day does help her to be able to function and drive without falling asleep. She has some trouble focusing, with horizontal diplopia that improves when she closes one eye. She is seeing her eye doctor. She is trying to keep busy and has taken on a project with her church.   HPI: This is a 81 yo RH woman who presented in April 2015 with a 2-year history of dizziness. She recalls feeling dizzy at church with a headache and was brought home by friends. That afternoon, she fell into a ditch near her home and was apparently there for hours  because she could not move her legs. She was able to dig holes in the wall and slowly crawl out to get a passerby's attention. She was brought to the hospital and developed ?cellulitis in her left leg, requiring a boot on her left leg. As she was going out the door one time, she fell and injured her other leg. Since then,she stated having dizziness described as "like I cannot keep my balance." There is occasional nausea and vomiting. She reports horizontal diplopia when tired, especially at night when driving. She denies any dysarthria, dysphagia, focal numbness/tingling/weakness, reporting that both feet are numb. She started having "blackout spells" where she would wake up on the floor with no recollection of events, no prior warning symptoms. Initially she was having up to 2 episodes a day. She has hit her kitchen cabinet twice and broke it. She denies any associated dizziness or focal weakness around the time of passing out. She started having headaches in the occipital region occurring daily. She reports episodes of passing out stopped after she self-discontinued her medications. Last episode occurred the week prior to her initial visit at the end of March 2015 when she woke up on the floor.   Diagnostic Data: I personally reviewed MRI brain/MRA head 12/2013 which were unremarkable, no evidence of acute or prior ischemia, there was mild intracranial atherosclerotic type changes without medium or large size vessel significant stenosis or occlusion. Her routine EEG was normal. Echo done in March 2013 showed increased LV wall thickness in a  pattern of severe LVH, EF 55-60%, mild mitral stenosis, left atrium moderately dilated.  PAST MEDICAL HISTORY: Past Medical History  Diagnosis Date  . ANEMIA 01/30/2008  . ARTHRITIS 12/11/2007  . BACK PAIN, CHRONIC 06/23/2008  . BREAST CYST, RIGHT 12/17/2007  . CONTACT DERMATITIS 03/10/2009  . CORONARY ARTERY DISEASE 03/01/2007    had a normal Myoview stress test  07-13-11  . CYSTOCELE WITHOUT MENTION UTERINE PROLAPSE LAT 09/12/2010  . DEGENERATIVE JOINT DISEASE 08/07/2007  . DEPRESSION 03/01/2007  . Esophageal reflux 11/06/2007  . HYPERKERATOSIS 06/02/2009  . HYPERLIPIDEMIA 08/26/2008  . HYPERTENSION 03/01/2007  . Irritable bowel syndrome 01/26/2009  . LABYRINTHITIS 11/03/2009  . Narcolepsy without cataplexy 08/26/2008  . OBESITY 03/01/2007  . PERIPHERAL VASCULAR DISEASE 01/26/2009  . SYNCOPE 08/26/2008    had brain MRI on 06-28-12 showing only chronic microvascular ischemia and atrophy   . TRANSIENT ISCHEMIC ATTACK 10/20/2009    had normal brain MRA with patent vertebrals and carotids 06-28-12  . NEPHROLITHIASIS 04/29/2009  . COPD (chronic obstructive pulmonary disease)   . Colon polyps     FRAGMENTS OF HYPERPLASTIC POLYP  . Diverticulosis of colon (without mention of hemorrhage)   . DIABETES MELLITUS, TYPE II 08/26/2008    sees Dr. Dwyane Dee     MEDICATIONS: Current Outpatient Prescriptions on File Prior to Visit  Medication Sig Dispense Refill  . buPROPion (WELLBUTRIN XL) 150 MG 24 hr tablet Take 1 tablet (150 mg total) by mouth daily. (Patient taking differently: Take 150 mg by mouth as needed. ) 90 tablet 3  . cyclobenzaprine (FLEXERIL) 10 MG tablet Take 1 tablet (10 mg total) by mouth 3 (three) times daily as needed for muscle spasms. 90 tablet 5  . fluticasone (FLONASE) 50 MCG/ACT nasal spray Place 2 sprays into both nostrils daily. 16 g 1  . Fluticasone-Salmeterol (ADVAIR DISKUS) 250-50 MCG/DOSE AEPB Inhale 1 puff into the lungs 2 (two) times daily. 60 each 6  . furosemide (LASIX) 40 MG tablet Take 1 tablet (40 mg total) by mouth 2 (two) times daily. TAKE 1 IN THE MORNING AND ONE IN THE EVENING 90 tablet 3  . gabapentin (NEURONTIN) 300 MG capsule Take 1 capsule (300 mg total) by mouth 3 (three) times daily. 90 capsule 5  . Insulin Aspart Prot & Aspart (NOVOLOG 70/30 MIX) (70-30) 100 UNIT/ML Pen Take 25 units with breakfast, 15 units with lunch and 25  units with dinner 45 mL 0  . Insulin Glargine (LANTUS SOLOSTAR) 100 UNIT/ML Solostar Pen INJECT 100 UNITS UNDER THE SKIN AT BEDTIME and inject 50 units in the morning 135 mL 3  . ketoconazole (NIZORAL) 2 % cream Apply 1 application topically 2 (two) times daily as needed for irritation. 60 g 5  . meclizine (ANTIVERT) 25 MG tablet Take 1 tablet (25 mg total) by mouth every 4 (four) hours as needed for dizziness. 300 tablet 3  . ondansetron (ZOFRAN) 8 MG tablet Take 1 tablet (8 mg total) by mouth every 8 (eight) hours as needed for nausea or vomiting. 90 tablet 5  . oxyCODONE-acetaminophen (PERCOCET) 10-325 MG per tablet Take 1 tablet by mouth every 6 (six) hours as needed for pain. 120 tablet 0  . phenazopyridine (PYRIDIUM) 200 MG tablet Take 200 mg by mouth 3 (three) times daily as needed for pain.    . pramipexole (MIRAPEX) 1 MG tablet Take 6 tablets (6 mg total) by mouth daily. 180 tablet 5  . zolpidem (AMBIEN) 10 MG tablet Take 1 tablet (10 mg total) by mouth  at bedtime. 30 tablet 5  . albuterol (PROVENTIL HFA;VENTOLIN HFA) 108 (90 BASE) MCG/ACT inhaler Inhale 2 puffs into the lungs every 4 (four) hours as needed for wheezing or shortness of breath. (Patient not taking: Reported on 06/14/2015) 1 Inhaler 3  . diclofenac sodium (VOLTAREN) 1 % GEL Apply 4 g topically 4 (four) times daily as needed (for arthritis pain in shoulders and fingers). For joint pain.    . metolazone (ZAROXOLYN) 2.5 MG tablet Take 1 tablet (2.5 mg total) by mouth daily. TAKE THIS 30 MINUTES PRIOR TO YOUR MORNING DOSE OF LASIX (Patient not taking: Reported on 06/14/2015) 90 tablet 3  . nitroGLYCERIN (NITROSTAT) 0.4 MG SL tablet Place 1 tablet (0.4 mg total) under the tongue every 5 (five) minutes as needed for chest pain. (Patient not taking: Reported on 05/20/2015) 50 tablet 5   No current facility-administered medications on file prior to visit.    ALLERGIES: Allergies  Allergen Reactions  . Aspirin Other (See Comments)     Abdominal pain  . Ciprofloxacin     Made pt very sick on stomach  . Clarithromycin     Made stomach burn  . Doxycycline Itching  . Erythromycin Nausea And Vomiting  . Macrobid [Nitrofurantoin Monohyd Macro] Nausea And Vomiting  . Metolazone     Pt stated this made her BP drop  . Other     Pt cannot have any of the MYCIN's  . Penicillins Swelling  . Sulfonamide Derivatives Nausea And Vomiting    FAMILY HISTORY: Family History  Problem Relation Age of Onset  . Lupus Mother   . COPD Father     SOCIAL HISTORY: Social History   Social History  . Marital Status: Widowed    Spouse Name: N/A  . Number of Children: N/A  . Years of Education: N/A   Occupational History  . Not on file.   Social History Main Topics  . Smoking status: Never Smoker   . Smokeless tobacco: Never Used  . Alcohol Use: No  . Drug Use: No  . Sexual Activity: No   Other Topics Concern  . Not on file   Social History Narrative    REVIEW OF SYSTEMS: Constitutional: No fevers, chills, or sweats, + generalized fatigue,no change in appetite Eyes: No visual changes, double vision, eye pain Ear, nose and throat: No hearing loss, ear pain, nasal congestion, sore throat Cardiovascular: No chest pain, palpitations Respiratory:  + shortness of breath at rest or with exertion, no wheezes GastrointestinaI: No nausea, vomiting, diarrhea, abdominal pain, fecal incontinence Genitourinary:  No dysuria, urinary retention or frequency Musculoskeletal:  + neck pain, back pain Integumentary: No rash, pruritus, skin lesions Neurological: as above Psychiatric: + depression, insomnia, anxiety Endocrine: No palpitations, fatigue, diaphoresis, mood swings, change in appetite, change in weight, increased thirst Hematologic/Lymphatic:  No anemia, purpura, petechiae. Allergic/Immunologic: no itchy/runny eyes, nasal congestion, recent allergic reactions, rashes  PHYSICAL EXAM: Filed Vitals:   06/14/15 1451  BP:  132/78  Pulse: 92  Resp: 18   General: No acute distress, flat affect Head:  Normocephalic/atraumatic, +tenderness to palpation over the bilateral occipital regions R>L Neck: supple, + paraspinal tenderness, full range of motion Heart:  Regular rate and rhythm Lungs:  Clear to auscultation bilaterally Back: No paraspinal tenderness Skin/Extremities: No rash, no edema Neurological Exam: alert and oriented to person, place, and time. No aphasia or dysarthria. Fund of knowledge is appropriate. Recent and remote memory are intact. Attention and concentration are normal. Able to name objects  and repeat phrases. Cranial nerves: Pupils equal, round, reactive to light. Fundoscopic exam unremarkable, no papilledema. Extraocular movements intact with no nystagmus. Visual fields full. Facial sensation intact. No facial asymmetry. Tongue, uvula, palate midline. Motor: Bulk and tone normal, muscle strength 5/5 throughout with no pronator drift. Decreased sensation in a stocking distribution to ankles bilaterally to cold, pin, vibration sense. Deep Tendon Reflexes: +2 both UE, unable to elicit bilateral patella and ankle jerks. no ankle clonus. Plantar responses: downgoing bilaterally. Cerebellar: no incoordination on finger to nose Gait: slow and cautious with cane, no ataxia. Romberg mild sway. Tremor: none  IMPRESSION: This is a 81 yo RH woman with a history of hypertension, diabetes, RLS, initially seen a year ago for dizziness, headaches, memory loss, and episodes of loss of consciousness. Brain MRI is normal, MRA head does not show evidence of vertebrobasilar compromise. No further episodes of loss of consciousness. She had reported dizziness and headaches were better and was lost to follow-up for more than a year, now presenting with increased headaches and dizziness described as unsteadiness. Neurological exam non-focal, with note of a length-dependent neuropathy, likely due to diabetic neuropathy.  We discussed that unsteadiness is likely due to sensory ataxia from peripheral neuropathy. We discussed better control of glucose levels. Headaches suggestive of cervicogenic headaches with tenderness on palpation. She will restart muscle relaxants for the neck pain, and will be referred to Physical Therapy for cervicogenic headaches. She was advised to discuss balance with PT as well, and start using a walker instead of cane. She is concerned about recurrent epistaxis with an abnormal sensation inside her nostril and bilateral ear pain, and will be referred to ENT for further evaluation. She will follow-up in 4-5 months and knows to call our office for any changes.   Thank you for allowing me to participate in her care.  Please do not hesitate to call for any questions or concerns.  The duration of this appointment visit was 25 minutes of face-to-face time with the patient.  Greater than 50% of this time was spent in counseling, explanation of diagnosis, planning of further management, and coordination of care.   Ellouise Newer, M.D.   CC: Dr. Sarajane Jews

## 2015-06-14 NOTE — Patient Instructions (Addendum)
1. Start Physical Therapy for neck pain and cervicogenic headaches, as well as for Balance therapy 2. Start using walker at all times for balance 3. Restart Flexeril 10mg  at bedtime for muscle spasms in neck and legs 4. Continue with better control of diabetes 5. Refer to ENT for frequent nosebleeds and bilateral ear pain 6. Schedule your sleep study 7. Follow-up in 4-5 months  8. You have been scheduled at [facility] with Dr. Simeon Craft on 06/22/2015. Please arrive @ 1:00 pm.    [address], [place] [address]  [phone]

## 2015-06-29 ENCOUNTER — Other Ambulatory Visit: Payer: Self-pay | Admitting: Family Medicine

## 2015-06-29 MED ORDER — OXYCODONE-ACETAMINOPHEN 10-325 MG PO TABS: 1.0000 | ORAL_TABLET | Freq: Four times a day (QID) | ORAL | Status: DC | PRN

## 2015-06-29 NOTE — Telephone Encounter (Signed)
Pt needs new rx oxycodone. Pt also would like md to know ambien 10 mg is no longer working and would like to increase mg

## 2015-06-29 NOTE — Telephone Encounter (Signed)
Try taking two Zolpidem at a time for a few nights. The Percocet rx is ready

## 2015-06-30 ENCOUNTER — Telehealth: Payer: Self-pay | Admitting: Family Medicine

## 2015-06-30 MED ORDER — DEXTROAMPHETAMINE SULFATE ER 10 MG PO CP24: 20.0000 mg | ORAL_CAPSULE | Freq: Every day | ORAL | Status: DC

## 2015-06-30 NOTE — Telephone Encounter (Signed)
Pt needs refill for dextroamphetamine (DEXEDRINE SPANSULE) 10 MG 24 hr capsule #90

## 2015-06-30 NOTE — Telephone Encounter (Signed)
done

## 2015-06-30 NOTE — Telephone Encounter (Signed)
Called and spoke with pt and pt is aware rx is ready for pick up and that she can try 2 Ambien at night.  Pt states she may start at 1.5 and then increase if needed.  Advised pt to keep the office up to date about this. Pt verbalized understanding.

## 2015-07-01 ENCOUNTER — Telehealth: Payer: Self-pay | Admitting: Family Medicine

## 2015-07-01 NOTE — Telephone Encounter (Signed)
Script is ready for pick up and I spoke with pt.  

## 2015-07-01 NOTE — Telephone Encounter (Signed)
error 

## 2015-07-02 NOTE — Telephone Encounter (Signed)
I called and left a voice message for pt to return my call.

## 2015-07-05 ENCOUNTER — Other Ambulatory Visit: Payer: Self-pay

## 2015-07-06 NOTE — Telephone Encounter (Signed)
I spoke with pharmacy and pt picked up script for Ambien. Scripts for Dexedrine are ready for pick up at front office and pt is aware.

## 2015-08-03 ENCOUNTER — Telehealth: Payer: Self-pay | Admitting: Family Medicine

## 2015-08-03 NOTE — Telephone Encounter (Signed)
error 

## 2015-08-04 ENCOUNTER — Ambulatory Visit: Payer: Medicare Other | Admitting: Cardiology

## 2015-08-12 ENCOUNTER — Encounter: Payer: Self-pay | Admitting: Cardiology

## 2015-08-12 ENCOUNTER — Ambulatory Visit (INDEPENDENT_AMBULATORY_CARE_PROVIDER_SITE_OTHER): Payer: Medicare Other | Admitting: Cardiology

## 2015-08-12 VITALS — BP 150/82 | HR 86 | Ht 67.5 in | Wt 266.0 lb

## 2015-08-12 DIAGNOSIS — I5033 Acute on chronic diastolic (congestive) heart failure: Secondary | ICD-10-CM

## 2015-08-12 DIAGNOSIS — I10 Essential (primary) hypertension: Secondary | ICD-10-CM

## 2015-08-12 DIAGNOSIS — E785 Hyperlipidemia, unspecified: Secondary | ICD-10-CM

## 2015-08-12 NOTE — Patient Instructions (Signed)
Medication Instructions:   Your physician recommends that you continue on your current medications as directed. Please refer to the Current Medication list given to you today.      Labwork:  IN ABOUT 5 MONTHS, PRIOR TO YOUR 6 MONTH FOLLOW-UP WITH DR Meda Coffee TO CHECK A CMET AND LIPIDS--PLEASE COME FASTING TO THIS LAB APPOINTMENT    Follow-Up:  Your physician wants you to follow-up in: [place] will receive a reminder letter in the mail two months in advance. If you don't receive a letter, please call our office to schedule the follow-up appointment.        If you need a refill on your cardiac medications before your next appointment, please call your pharmacy.

## 2015-08-12 NOTE — Progress Notes (Signed)
Patient ID: OTIS TERNES, female   DOB: Jun 09, 1933, 81 y.o.   MRN: [id number]    Patient Name: Melanie Mcdonald Date of Encounter: 08/12/2015  Primary Care Provider:  Laurey Morale, MD Primary Cardiologist:  Dorothy Spark  Problem List   Past Medical History  Diagnosis Date  . ANEMIA 01/30/2008  . ARTHRITIS 12/11/2007  . BACK PAIN, CHRONIC 06/23/2008  . BREAST CYST, RIGHT 12/17/2007  . CONTACT DERMATITIS 03/10/2009  . CORONARY ARTERY DISEASE 03/01/2007    had a normal Myoview stress test 07-13-11  . CYSTOCELE WITHOUT MENTION UTERINE PROLAPSE LAT 09/12/2010  . DEGENERATIVE JOINT DISEASE 08/07/2007  . DEPRESSION 03/01/2007  . Esophageal reflux 11/06/2007  . HYPERKERATOSIS 06/02/2009  . HYPERLIPIDEMIA 08/26/2008  . HYPERTENSION 03/01/2007  . Irritable bowel syndrome 01/26/2009  . LABYRINTHITIS 11/03/2009  . Narcolepsy without cataplexy 08/26/2008  . OBESITY 03/01/2007  . PERIPHERAL VASCULAR DISEASE 01/26/2009  . SYNCOPE 08/26/2008    had brain MRI on 06-28-12 showing only chronic microvascular ischemia and atrophy   . TRANSIENT ISCHEMIC ATTACK 10/20/2009    had normal brain MRA with patent vertebrals and carotids 06-28-12  . NEPHROLITHIASIS 04/29/2009  . COPD (chronic obstructive pulmonary disease) ([place])   . Colon polyps     FRAGMENTS OF HYPERPLASTIC POLYP  . Diverticulosis of colon (without mention of hemorrhage)   . DIABETES MELLITUS, TYPE II 08/26/2008    sees Dr. Dwyane Dee    Past Surgical History  Procedure Laterality Date  . Cataract extraction    . Abdominal hysterectomy    . Back surgery      x2  . Tonsilectomy, adenoidectomy, bilateral myringotomy and tubes    . Spine surgery      x 3  . Cardiac catheterization  07/2009  . Intraocular lens insertion    . Esophagogastroduodenoscopy  11-13-07    per Dr. Deatra Ina, normal   . Colonoscopy  11-13-07    per Dr. Deatra Ina, benign polyps, repeat in 5 yrs   Allergies  Allergies  Allergen Reactions  . Aspirin Other (See Comments)   Abdominal pain  . Ciprofloxacin     Made pt very sick on stomach  . Clarithromycin     Made stomach burn  . Doxycycline Itching  . Erythromycin Nausea And Vomiting  . Macrobid [Nitrofurantoin Monohyd Macro] Nausea And Vomiting  . Metolazone     Pt stated this made her BP drop  . Other     Pt cannot have any of the MYCIN's  . Penicillins Swelling  . Sulfonamide Derivatives Nausea And Vomiting   HPI  81 year old female who appears to be significantly fatigued and depressed, with prior medical history of hypertension, hyperlipidemia, obesity, narcolepsy, multiple TIAs, prior cardiac catheterization with no intervention and negative stress test in 2012. Patient states that she has been profoundly fatigued and short of breath. 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 Patient appears very tired and in fact falls asleep twice during conversation and appears significantly short of breath just walking a few steps. She hasn't had pulmonary function test or chest CT. She underwent echocardiographic in March of this year that showed preserved left ventricular ejection fraction, no comment about diastolic function, mild mitral stenosis and no comment about right-sided pressures. She states that she always feels dizzy and she has been worked up for bradycardia in the  past during surgery day heart monitor but is not sure what was decided based on the results. There is somedate and that her heart rate went as low as 40s and there was consideration about placement of permanent pacemaker. The patient states that she since she last saw Dr. Gwenlyn Found she has had stable daily dizziness but no syncope. She doesn't want to follow with Dr. Gwenlyn Found. The patient states that she has a history of asthma but she's not using any inhalers.  11/03/2014 - the patient is  coming after 6 months she feels the same significant fatigue and shortness of breath with minimal exertion. No syncope or palpitations. No chest pain. She admits that she feels like she's taking a lot of medicines and since she had syncopal episode after being started multiple medicines she only takes medicines as needed. She complains of significant lower extremity edema.  08/12/2015 - patient is coming after 6 months, she states that she was not taking metolazone as she wouldn't be able to leave house, she is trying to take lasix while at home. Occasional PND and orthopnea, chronic LE edema. She has been dealing with falls, no blackouts, but loss of balance, she fell in her shower yesterday. She has chronic headaches, worse since yesterday. No chest pain or palpitations, no loss of consciousness.  Home Medications  Prior to Admission medications   Medication Sig Start Date End Date Taking? Authorizing Provider  buPROPion (WELLBUTRIN XL) 150 MG 24 hr tablet Take 1 tablet (150 mg total) by mouth daily. 03/19/14   Laurey Morale, MD  diclofenac sodium (VOLTAREN) 1 % GEL Apply 4 g topically 4 (four) times daily as needed (for arthritis pain in shoulders and fingers). For joint pain. 04/07/13   Laurey Morale, MD  furosemide (LASIX) 40 MG tablet TAKE 1 TABLET BY MOUTH TWICE DAILY 03/17/14   Laurey Morale, MD  Insulin Aspart Prot & Aspart (NOVOLOG MIX 70/30 FLEXPEN) (70-30) 100 UNIT/ML Pen Inject 10 Units into the skin 2 (two) times daily. 02/27/14   Laurey Morale, MD  insulin glargine (LANTUS) 100 unit/mL SOPN Inject 100 Units into the skin at bedtime.    Historical Provider, MD  ketoconazole (NIZORAL) 2 % cream Apply 1 application topically 2 (two) times daily as needed for irritation. 03/19/14   Laurey Morale, MD  meclizine (ANTIVERT) 25 MG tablet Take 25 mg by mouth every 4 (four) hours as needed for dizziness.    Historical Provider, MD  nitroGLYCERIN (NITROSTAT) 0.4 MG SL tablet Place 1 tablet (0.4 mg  total) under the tongue every 5 (five) minutes as needed for chest pain. 07/29/13   Laurey Morale, MD  oxyCODONE-acetaminophen (PERCOCET) 10-325 MG per tablet Take 1 tablet by mouth at bedtime. 12/10/13   Laurey Morale, MD  potassium chloride (KLOR-CON 10) 10 MEQ tablet Take 1 tablet (10 mEq total) by mouth daily. 02/27/14   Laurey Morale, MD  pramipexole (MIRAPEX) 1 MG tablet Take 3 mg by mouth at bedtime.    Historical Provider, MD  promethazine (PHENERGAN) 25 MG tablet Take 1 tablet (25 mg total) by mouth every 6 (six) hours as needed for nausea (nausea). For nausea. 05/22/13   Laurey Morale, MD  zolpidem (AMBIEN) 10 MG tablet Take 10 mg by mouth at bedtime.    Historical Provider, MD    Family History  Family History  Problem Relation Age of Onset  . Lupus Mother   . COPD Father    Social  History  Social History   Social History  . Marital Status: Widowed    Spouse Name: N/A  . Number of Children: N/A  . Years of Education: N/A   Occupational History  . Not on file.   Social History Main Topics  . Smoking status: Never Smoker   . Smokeless tobacco: Never Used  . Alcohol Use: No  . Drug Use: No  . Sexual Activity: No   Other Topics Concern  . Not on file   Social History Narrative    Review of Systems, as per HPI, otherwise negative General:  No chills, fever, night sweats or weight changes.  Cardiovascular:  No chest pain, dyspnea on exertion, edema, orthopnea, palpitations, paroxysmal nocturnal dyspnea. Dermatological: No rash, lesions/masses Respiratory: No cough, dyspnea Urologic: No hematuria, dysuria Abdominal:   No nausea, vomiting, diarrhea, bright red blood per rectum, melena, or hematemesis Neurologic:  No visual changes, wkns, changes in mental status. All other systems reviewed and are otherwise negative except as noted above.  Physical Exam  There were no vitals taken for this visit.  General: Pleasant, NAD Psych: Normal affect. Neuro: Alert and  oriented X 3. Moves all extremities spontaneously. HEENT: Normal  Neck: Supple without bruits or JVD. Lungs:  Resp regular and unlabored, CTA. Heart: RRR no s3, s4, or murmurs. Abdomen: Soft, non-tender, non-distended, BS + x 4.  Extremities: No clubbing, cyanosis, severe non-pitting B/L, changes consistent with chronic venous insufficiency. DP/PT/Radials 2+ and equal bilaterally.  Labs:  No results for input(s): CKTOTAL, CKMB, TROPONINI in the last 72 hours. Lab Results  Component Value Date   WBC 10.8* 05/10/2015   HGB 13.4 05/10/2015   HCT 41.0 05/10/2015   MCV 88.5 05/10/2015   PLT 239.0 05/10/2015    Lab Results  Component Value Date   DDIMER 0.50* 02/13/2014   Invalid input(s): POCBNP    Component Value Date/Time   NA 140 05/10/2015 1650   K 4.6 05/10/2015 1650   CL 102 05/10/2015 1650   CO2 32 05/10/2015 1650   GLUCOSE 166* 05/10/2015 1650   BUN 18 05/10/2015 1650   CREATININE 0.96 05/10/2015 1650   CALCIUM 9.4 05/10/2015 1650   PROT 7.5 05/10/2015 1650   ALBUMIN 3.7 05/10/2015 1650   AST 16 05/10/2015 1650   ALT 13 05/10/2015 1650   ALKPHOS 71 05/10/2015 1650   BILITOT 0.3 05/10/2015 1650   GFRNONAA 41* 11/10/2014 1210   GFRAA 48* 11/10/2014 1210   Lab Results  Component Value Date   CHOL 160 12/21/2014   HDL 74 12/21/2014   LDLCALC 74 12/21/2014   TRIG 62 12/21/2014   Accessory Clinical Findings  Echocardiogram - 12/16/2013  - Left ventricle: The cavity size was normal. Wall thickness was increased in a pattern of severe LVH. Systolic function was normal. The estimated ejection fraction was in the range of 55% to 60%. - Mitral valve: Calcified annulus. Moderately calcified leaflets . The findings are consistent with mild stenosis. - Left atrium: The atrium was moderately dilated. - Atrial septum: No defect or patent foramen ovale was identified.  ECG - sinus rhythm  Lexiscan nuclear stress test: Quantitative Gated Spect Images QGS EDV: 87  ml QGS ESV: 34 ml  Impression Exercise Capacity: Lexiscan with no exercise. BP Response: Hypotensive blood pressure response. Clinical Symptoms: No significant symptoms noted. ECG Impression: No significant ECG changes with Lexiscan. Comparison with Prior Nuclear Study: No images to compare  Overall Impression: Low risk stress nuclear study with small area of  fixed inferobasal bowel attenuation artifact. No reversible ischemia.  LV Ejection Fraction: 61%. LV Wall Motion: NL LV Function; NL Wall Motion  Pixie Casino, MD, [facility] Board Certified in Nuclear Cardiology Attending Cardiologist [place]  81 year old female with poly morbidities  1. Severe SOB - sec to uncontrolled HTN -  Lexiscan nuclear stress test was negative - CT/CTA chest to assess for parenchymal lung disease and chronic thromboembolic disease that were ruled out.There was no significant finding on her chest CT but finding of thyroid nodule, that was biopsied and showed benign nodule. - started Advair 250/50 BID and albuterol PRN, however normal pulmonary function test was normal  2. Hypertension with severe concentric LVH on echocardiogram, amlodipine and metolazone caused hypotension, today's blood pressure is mildly elevated, she is instructed to make sure she is taking Toprol XL 25 mg daily.   3. Acute and chronic diastolic CHF - the patient is not able to take metolazone causes her hypotension, she is encouraged to continue taking Lasix 40 mg twice a day and elevate her lower extremities and use compression stockings as much as possible. I reviewed her basic metabolic profile from [id number] and her electrolytes and creatinine were normal.  4. Obstructive apnea - and narcolepsy seems majority of symptoms can be related to the obstructive sleep apnea, is a sleep study scheduled for next month.   5. Lipids - not checked since 2013, we will check today and NMR lipid  profile.  6. Frequent falls - advised to go to the ER if her headache becomes worse.   Follow-up in 6 months, CMP prior to the visit.    Dorothy Spark, MD, [facility] 08/12/2015, 12:02 PM

## 2015-08-16 ENCOUNTER — Ambulatory Visit: Payer: Medicare Other | Admitting: Family Medicine

## 2015-08-24 ENCOUNTER — Encounter: Payer: Self-pay | Admitting: Family Medicine

## 2015-08-24 ENCOUNTER — Ambulatory Visit (INDEPENDENT_AMBULATORY_CARE_PROVIDER_SITE_OTHER): Payer: Medicare Other | Admitting: Family Medicine

## 2015-08-24 VITALS — BP 166/87 | HR 87 | Temp 97.9°F

## 2015-08-24 DIAGNOSIS — M25572 Pain in left ankle and joints of left foot: Secondary | ICD-10-CM | POA: Diagnosis not present

## 2015-08-24 DIAGNOSIS — S0093XA Contusion of unspecified part of head, initial encounter: Secondary | ICD-10-CM | POA: Diagnosis not present

## 2015-08-24 MED ORDER — BUPROPION HCL ER (XL) 150 MG PO TB24: 150.0000 mg | ORAL_TABLET | Freq: Every day | ORAL | Status: DC

## 2015-08-24 MED ORDER — OXYCODONE-ACETAMINOPHEN 10-325 MG PO TABS: 1.0000 | ORAL_TABLET | Freq: Four times a day (QID) | ORAL | Status: DC | PRN

## 2015-08-24 NOTE — Progress Notes (Signed)
   Subjective:    Patient ID: Melanie Mcdonald, female    DOB: 1933-02-13, 81 y.o.   MRN: [id number]  HPI Here for injuries sustained when she fell last night at home. She got up from bed to go to the bathroom and lost her balance, causing her to fall to the floor. She did not lose consciousness and she has full memory of the episode. She bumped her forehead and th side of her head on the door frame, and she thinks she twisted the left foot. She immediately had pain in the foot when she bears weight on it. No HA or nausea or blurred vision.    Review of Systems  Constitutional: Negative.   HENT: Negative.   Eyes: Negative.   Respiratory: Negative.   Cardiovascular: Negative.   Musculoskeletal: Positive for arthralgias.  Neurological: Positive for dizziness.       Objective:   Physical Exam  Constitutional: She is oriented to person, place, and time. She appears well-developed and well-nourished.  HENT:  Small tender hematoma on the forehead and another one on the left parietal scalp   Eyes: Conjunctivae and EOM are normal. Pupils are equal, round, and reactive to light.  Neck: Neck supple. No thyromegaly present.  Cardiovascular: Normal rate, regular rhythm, normal heart sounds and intact distal pulses.   Pulmonary/Chest: Effort normal and breath sounds normal.  Musculoskeletal:  The left foot is swollen and very tender along the fifth metatarsal.   Lymphadenopathy:    She has no cervical adenopathy.  Neurological: She is alert and oriented to person, place, and time.          Assessment & Plan:  Likely a metatarsal fracture. We will have her see Orthopedics ASAP. She has a cane to assist with walking, and Percocet to use for pain.

## 2015-08-24 NOTE — Progress Notes (Signed)
Pre visit review using our clinic review tool, if applicable. No additional management support is needed unless otherwise documented below in the visit note. 

## 2015-08-25 ENCOUNTER — Encounter (HOSPITAL_COMMUNITY): Payer: Self-pay | Admitting: Emergency Medicine

## 2015-08-25 ENCOUNTER — Emergency Department (HOSPITAL_COMMUNITY)
Admission: EM | Admit: 2015-08-25 | Discharge: 2015-08-25 | Disposition: A | Discharge status: Home / Self Care | Payer: Medicare Other | Attending: Emergency Medicine | Admitting: Emergency Medicine

## 2015-08-25 ENCOUNTER — Emergency Department (HOSPITAL_COMMUNITY): Payer: Medicare Other

## 2015-08-25 DIAGNOSIS — Z87442 Personal history of urinary calculi: Secondary | ICD-10-CM | POA: Diagnosis not present

## 2015-08-25 DIAGNOSIS — E669 Obesity, unspecified: Secondary | ICD-10-CM | POA: Diagnosis not present

## 2015-08-25 DIAGNOSIS — Y92002 Bathroom of unspecified non-institutional (private) residence single-family (private) house as the place of occurrence of the external cause: Secondary | ICD-10-CM | POA: Insufficient documentation

## 2015-08-25 DIAGNOSIS — F329 Major depressive disorder, single episode, unspecified: Secondary | ICD-10-CM | POA: Diagnosis not present

## 2015-08-25 DIAGNOSIS — S0990XA Unspecified injury of head, initial encounter: Secondary | ICD-10-CM | POA: Diagnosis not present

## 2015-08-25 DIAGNOSIS — Z7951 Long term (current) use of inhaled steroids: Secondary | ICD-10-CM | POA: Insufficient documentation

## 2015-08-25 DIAGNOSIS — I2510 Atherosclerotic heart disease of native coronary artery without angina pectoris: Secondary | ICD-10-CM | POA: Diagnosis not present

## 2015-08-25 DIAGNOSIS — Z862 Personal history of diseases of the blood and blood-forming organs and certain disorders involving the immune mechanism: Secondary | ICD-10-CM | POA: Insufficient documentation

## 2015-08-25 DIAGNOSIS — Z87448 Personal history of other diseases of urinary system: Secondary | ICD-10-CM | POA: Diagnosis not present

## 2015-08-25 DIAGNOSIS — Z8719 Personal history of other diseases of the digestive system: Secondary | ICD-10-CM | POA: Insufficient documentation

## 2015-08-25 DIAGNOSIS — I10 Essential (primary) hypertension: Secondary | ICD-10-CM | POA: Insufficient documentation

## 2015-08-25 DIAGNOSIS — Z880 Allergy status to penicillin: Secondary | ICD-10-CM | POA: Diagnosis not present

## 2015-08-25 DIAGNOSIS — R42 Dizziness and giddiness: Secondary | ICD-10-CM | POA: Diagnosis present

## 2015-08-25 DIAGNOSIS — E119 Type 2 diabetes mellitus without complications: Secondary | ICD-10-CM | POA: Insufficient documentation

## 2015-08-25 DIAGNOSIS — Y9389 Activity, other specified: Secondary | ICD-10-CM | POA: Insufficient documentation

## 2015-08-25 DIAGNOSIS — Z86010 Personal history of colonic polyps: Secondary | ICD-10-CM | POA: Insufficient documentation

## 2015-08-25 DIAGNOSIS — M1990 Unspecified osteoarthritis, unspecified site: Secondary | ICD-10-CM | POA: Diagnosis not present

## 2015-08-25 DIAGNOSIS — Z9889 Other specified postprocedural states: Secondary | ICD-10-CM | POA: Diagnosis not present

## 2015-08-25 DIAGNOSIS — Y998 Other external cause status: Secondary | ICD-10-CM | POA: Insufficient documentation

## 2015-08-25 DIAGNOSIS — G8929 Other chronic pain: Secondary | ICD-10-CM | POA: Diagnosis not present

## 2015-08-25 DIAGNOSIS — Z8742 Personal history of other diseases of the female genital tract: Secondary | ICD-10-CM | POA: Diagnosis not present

## 2015-08-25 DIAGNOSIS — Z79899 Other long term (current) drug therapy: Secondary | ICD-10-CM | POA: Diagnosis not present

## 2015-08-25 DIAGNOSIS — W01198A Fall on same level from slipping, tripping and stumbling with subsequent striking against other object, initial encounter: Secondary | ICD-10-CM | POA: Diagnosis not present

## 2015-08-25 DIAGNOSIS — J449 Chronic obstructive pulmonary disease, unspecified: Secondary | ICD-10-CM | POA: Insufficient documentation

## 2015-08-25 DIAGNOSIS — Z794 Long term (current) use of insulin: Secondary | ICD-10-CM | POA: Diagnosis not present

## 2015-08-25 DIAGNOSIS — Z8673 Personal history of transient ischemic attack (TIA), and cerebral infarction without residual deficits: Secondary | ICD-10-CM | POA: Diagnosis not present

## 2015-08-25 DIAGNOSIS — W19XXXA Unspecified fall, initial encounter: Secondary | ICD-10-CM

## 2015-08-25 LAB — URINALYSIS, ROUTINE W REFLEX MICROSCOPIC
Bilirubin Urine: NEGATIVE
GLUCOSE, UA: NEGATIVE mg/dL
Hgb urine dipstick: NEGATIVE
Ketones, ur: NEGATIVE mg/dL
Nitrite: NEGATIVE
PH: 5.5 (ref 5.0–8.0)
PROTEIN: NEGATIVE mg/dL
Specific Gravity, Urine: 1.026 (ref 1.005–1.030)

## 2015-08-25 LAB — CBC
HEMATOCRIT: 37.8 % (ref 36.0–46.0)
Hemoglobin: 12.3 g/dL (ref 12.0–15.0)
MCH: 28.9 pg (ref 26.0–34.0)
MCHC: 32.5 g/dL (ref 30.0–36.0)
MCV: 88.7 fL (ref 78.0–100.0)
Platelets: 229 10*3/uL (ref 150–400)
RBC: 4.26 MIL/uL (ref 3.87–5.11)
RDW: 14.5 % (ref 11.5–15.5)
WBC: 9.5 10*3/uL (ref 4.0–10.5)

## 2015-08-25 LAB — BASIC METABOLIC PANEL
Anion gap: 6 (ref 5–15)
BUN: 17 mg/dL (ref 6–20)
CHLORIDE: 107 mmol/L (ref 101–111)
CO2: 27 mmol/L (ref 22–32)
Calcium: 8.9 mg/dL (ref 8.9–10.3)
Creatinine, Ser: 0.78 mg/dL (ref 0.44–1.00)
GFR calc non Af Amer: >60 mL/min (ref >60)
Glucose, Bld: 157 mg/dL — ABNORMAL HIGH (ref 65–99)
POTASSIUM: 4.4 mmol/L (ref 3.5–5.1)
SODIUM: 140 mmol/L (ref 135–145)

## 2015-08-25 LAB — URINE MICROSCOPIC-ADD ON: Bacteria, UA: NONE SEEN

## 2015-08-25 LAB — CBG MONITORING, ED: Glucose-Capillary: 125 mg/dL — ABNORMAL HIGH (ref 65–99)

## 2015-08-25 NOTE — Progress Notes (Signed)
Patient suffers from left metatarsal fracture with a cast intact which impairs her ability to perform daily activities like weight bearing mobility, weight bearing, bathing and dressing in the home.  A cane nor walker will not resolve  issue with performing activities of daily living related difficulty with weight bearing. A wheelchair will allow patient to safely perform daily activities and prevent further injury/falls. Patient can safely propel the wheelchair in the home or has a caregiver who can provide assistance.  Accessories: elevating leg rests (ELRs), wheel locks, extensions and anti-tippers.

## 2015-08-25 NOTE — Progress Notes (Addendum)
Call from [facility] to state w/c to be delivered to pt and pt can call to get updated on status  Follow-up With Details Why [place] Call on 08/25/2015 The order has been completed for your wheelchair It is scheduled for delivery You may call to get the status or update on this delivery date and time using the number [phone] [place] [address] [phone] [facility], [place] Call on 08/25/2015 An order for home health physical therapy evaluation &treatment has been initiated for you at the ED.You will be called by [facility] staff to discuss the day & time of the visit to your home.After leaving ED call [phone] for further questions [address] [place] [address] [phone]

## 2015-08-25 NOTE — Care Management Note (Addendum)
Case Management Note  Patient Details  Name: Melanie Mcdonald MRN: [id number] Date of Birth: 1933-09-09  Subjective/Objective:  81 yr old blue medicare pt fell 2 days ago  pcp is Dr Barbie Banner. EPIC indicates pt seen on 08/24/15 for pcp OV. OV notes indicate fx of left foot, head contusion States "cast put on at my doctor." Pcp OV note indicates pt fx wihtAt bedside is Melanie Mcdonald her daughter in Sports coach (wife of son Melanie Mcdonald) They both confirm that Melanie Mcdonald, son stays with pt during the week and Melanie Mcdonald or Melanie Mcdonald takes the holidays and weekends Confirms Melanie Mcdonald will be with pt today/tonight.   Pt states she has a rollator, cane and bedside commode  Denies w/c and crutches  Pt noted during assessment to doze off at intervals Cm notes at this time pt has not been given medications per MAR Ht 5'7" wt per pt 230 lbs EPIC indicates 266 lbs               Action/Plan:  CM reviewed in details medicare guidelines, home health ([place]) (length of stay in home, types of [facility] staff available, coverage, primary caregiver, up to 24 hrs before services may be started) and Private duty nursing (PDN-coverage, length of stay in the home types of staff available). CM discussed the need to call [facility] agency for updates after d/c from ED vs ED. CM provided pt/family with a list of [place] home health agencies and PDN.  Discussed HHPT for evaluation and treatment for wt bearing status   1200 ED Cm spoke with [name] and [name] to see about HHPT & w/c needs including Melanie Mcdonald want inquiry about delivery, pickup of w/c time, date Pending return call from [facility]  Expected Discharge Date:                  Expected Discharge Plan:  [place] Referral:  NA  Discharge planning Services  CM Consult  Post Acute Care Choice:  Durable Medical Equipment Choice offered to:  Patient  DME Arranged:    DME Agency:  [place]:  PT [place]:  [place]  Status of Service:   Completed, signed off    Additional Comments:  Robbie Lis, RN 08/25/2015, 12:00 PM

## 2015-08-25 NOTE — Progress Notes (Signed)
In basket msg to dr Alysia Penna sent re: ED visit, w/c HHPT initiated

## 2015-08-25 NOTE — ED Provider Notes (Signed)
CSN: [id number]     Arrival date & time 08/25/15  [id number] History   First MD Initiated Contact with Patient 08/25/15 1007     Chief Complaint  Patient presents with  . Dizziness     (Consider location/radiation/quality/duration/timing/severity/associated sxs/prior Treatment) Patient is a 81 y.o. female presenting with fall. The history is provided by the patient (Patient was dizzy couple days ago fell and hit her head and prep her foot. She seen orthopedics for foot but she was told to come here to get her head x-ray. Patient feels no more dizziness but does have headache.).  Fall This is a new problem. The current episode started 2 days ago. The problem occurs constantly. The problem has not changed since onset.Associated symptoms include headaches. Pertinent negatives include no chest pain and no abdominal pain. Nothing aggravates the symptoms. Nothing relieves the symptoms.    Past Medical History  Diagnosis Date  . ANEMIA 01/30/2008  . ARTHRITIS 12/11/2007  . BACK PAIN, CHRONIC 06/23/2008  . BREAST CYST, RIGHT 12/17/2007  . CONTACT DERMATITIS 03/10/2009  . CORONARY ARTERY DISEASE 03/01/2007    had a normal Myoview stress test 07-13-11  . CYSTOCELE WITHOUT MENTION UTERINE PROLAPSE LAT 09/12/2010  . DEGENERATIVE JOINT DISEASE 08/07/2007  . DEPRESSION 03/01/2007  . Esophageal reflux 11/06/2007  . HYPERKERATOSIS 06/02/2009  . HYPERLIPIDEMIA 08/26/2008  . HYPERTENSION 03/01/2007  . Irritable bowel syndrome 01/26/2009  . LABYRINTHITIS 11/03/2009  . Narcolepsy without cataplexy 08/26/2008  . OBESITY 03/01/2007  . PERIPHERAL VASCULAR DISEASE 01/26/2009  . SYNCOPE 08/26/2008    had brain MRI on 06-28-12 showing only chronic microvascular ischemia and atrophy   . TRANSIENT ISCHEMIC ATTACK 10/20/2009    had normal brain MRA with patent vertebrals and carotids 06-28-12  . NEPHROLITHIASIS 04/29/2009  . COPD (chronic obstructive pulmonary disease) ([place])   . Colon polyps     FRAGMENTS OF HYPERPLASTIC POLYP   . Diverticulosis of colon (without mention of hemorrhage)   . DIABETES MELLITUS, TYPE II 08/26/2008    sees Dr. Dwyane Dee    Past Surgical History  Procedure Laterality Date  . Cataract extraction    . Abdominal hysterectomy    . Back surgery      x2  . Tonsilectomy, adenoidectomy, bilateral myringotomy and tubes    . Spine surgery      x 3  . Cardiac catheterization  07/2009  . Intraocular lens insertion    . Esophagogastroduodenoscopy  11-13-07    per Dr. Deatra Ina, normal   . Colonoscopy  11-13-07    per Dr. Deatra Ina, benign polyps, repeat in 5 yrs   Family History  Problem Relation Age of Onset  . Lupus Mother   . COPD Father    Social History  Substance Use Topics  . Smoking status: Never Smoker   . Smokeless tobacco: Never Used  . Alcohol Use: No   OB History    No data available     Review of Systems  Constitutional: Negative for appetite change and fatigue.  HENT: Negative for congestion, ear discharge and sinus pressure.   Eyes: Negative for discharge.  Respiratory: Negative for cough.   Cardiovascular: Negative for chest pain.  Gastrointestinal: Negative for abdominal pain and diarrhea.  Genitourinary: Negative for frequency and hematuria.  Musculoskeletal: Negative for back pain.  Skin: Negative for rash.  Neurological: Positive for headaches. Negative for seizures.  Psychiatric/Behavioral: Negative for hallucinations.      Allergies  Aspirin; Ciprofloxacin; Clarithromycin; Doxycycline; Erythromycin; Lyrica; Macrobid; Metolazone; Other; Penicillins; and  Sulfonamide derivatives  Home Medications   Prior to Admission medications   Medication Sig Start Date End Date Taking? Authorizing Provider  albuterol (PROVENTIL HFA;VENTOLIN HFA) 108 (90 BASE) MCG/ACT inhaler Inhale 2 puffs into the lungs every 4 (four) hours as needed for wheezing or shortness of breath. 04/01/14  Yes Dorothy Spark, MD  cyclobenzaprine (FLEXERIL) 10 MG tablet Take 1 tablet (10 mg  total) by mouth at bedtime. 06/14/15  Yes Cameron Sprang, MD  dextroamphetamine (DEXEDRINE SPANSULE) 10 MG 24 hr capsule Take 20 mg by mouth daily as needed (alert).  07/09/15  Yes Historical Provider, MD  diclofenac sodium (VOLTAREN) 1 % GEL Apply 4 g topically 4 (four) times daily as needed (for arthritis pain in shoulders and fingers). For joint pain. 04/07/13  Yes Laurey Morale, MD  fluticasone (FLONASE) 50 MCG/ACT nasal spray Place 2 sprays into both nostrils daily. 12/30/14  Yes Kennyth Arnold, FNP  Fluticasone-Salmeterol (ADVAIR DISKUS) 250-50 MCG/DOSE AEPB Inhale 1 puff into the lungs 2 (two) times daily. 04/01/14  Yes Dorothy Spark, MD  FLUZONE HIGH-DOSE 0.5 ML SUSY Inject 1 Dose as directed. 07/01/15  Yes Historical Provider, MD  furosemide (LASIX) 40 MG tablet Take 1 tablet (40 mg total) by mouth 2 (two) times daily. TAKE 1 IN THE MORNING AND ONE IN THE EVENING 11/03/14  Yes Dorothy Spark, MD  gabapentin (NEURONTIN) 300 MG capsule Take 1 capsule (300 mg total) by mouth 3 (three) times daily. 05/20/15  Yes Laurey Morale, MD  Insulin Aspart Prot & Aspart (NOVOLOG 70/30 MIX) (70-30) 100 UNIT/ML Pen Take 25 units with breakfast, 15 units with lunch and 25 units with dinner 06/03/14  Yes Laurey Morale, MD  Insulin Glargine (LANTUS SOLOSTAR) 100 UNIT/ML Solostar Pen INJECT 100 UNITS UNDER THE SKIN AT BEDTIME and inject 50 units in the morning 11/17/14  Yes Laurey Morale, MD  ketoconazole (NIZORAL) 2 % cream Apply 1 application topically 2 (two) times daily as needed for irritation. 06/26/14  Yes Eulas Post, MD  meclizine (ANTIVERT) 25 MG tablet Take 1 tablet (25 mg total) by mouth every 4 (four) hours as needed for dizziness. 05/14/14  Yes Laurey Morale, MD  nitroGLYCERIN (NITROSTAT) 0.4 MG SL tablet Place 1 tablet (0.4 mg total) under the tongue every 5 (five) minutes as needed for chest pain. 07/29/13  Yes Laurey Morale, MD  ondansetron (ZOFRAN) 8 MG tablet Take 1 tablet (8 mg total) by mouth  every 8 (eight) hours as needed for nausea or vomiting. 05/20/15  Yes Laurey Morale, MD  OVER THE COUNTER MEDICATION Apply 1 application topically 2 (two) times daily as needed (pain). Over the counter arthritis gel   Yes Historical Provider, MD  oxyCODONE-acetaminophen (PERCOCET) 10-325 MG tablet Take 1 tablet by mouth every 6 (six) hours as needed for pain. 08/24/15  Yes Laurey Morale, MD  phenazopyridine (PYRIDIUM) 200 MG tablet Take 200 mg by mouth 3 (three) times daily as needed for pain.   Yes Historical Provider, MD  pramipexole (MIRAPEX) 1 MG tablet Take 6 tablets (6 mg total) by mouth daily. 05/20/15  Yes Laurey Morale, MD  zolpidem (AMBIEN) 10 MG tablet Take 1 tablet (10 mg total) by mouth at bedtime. 05/20/15  Yes Laurey Morale, MD  buPROPion (WELLBUTRIN XL) 150 MG 24 hr tablet Take 1 tablet (150 mg total) by mouth daily. 08/24/15   Laurey Morale, MD  metolazone (ZAROXOLYN) 2.5 MG tablet Take  1 tablet (2.5 mg total) by mouth daily. TAKE THIS 30 MINUTES PRIOR TO YOUR MORNING DOSE OF LASIX 11/03/14   Dorothy Spark, MD   BP 162/73 mmHg  Pulse 92  Temp(Src) 97.9 F (36.6 C) (Oral)  Resp 18  SpO2 96% Physical Exam  Constitutional: She is oriented to person, place, and time. She appears well-developed.  HENT:  Head: Normocephalic.  Eyes: Conjunctivae and EOM are normal. No scleral icterus.  Neck: Neck supple. No thyromegaly present.  Cardiovascular: Normal rate and regular rhythm.  Exam reveals no gallop and no friction rub.   No murmur heard. Pulmonary/Chest: No stridor. She has no wheezes. She has no rales. She exhibits no tenderness.  Abdominal: She exhibits no distension. There is no tenderness. There is no rebound.  Musculoskeletal: Normal range of motion. She exhibits no edema.  Lymphadenopathy:    She has no cervical adenopathy.  Neurological: She is oriented to person, place, and time. She exhibits normal muscle tone. Coordination normal.  Skin: No rash noted. No erythema.   Psychiatric: She has a normal mood and affect. Her behavior is normal.    ED Course  Procedures (including critical care time) Labs Review Labs Reviewed  BASIC METABOLIC PANEL - Abnormal; Notable for the following:    Glucose, Bld 157 (*)    All other components within normal limits  URINALYSIS, ROUTINE W REFLEX MICROSCOPIC (NOT AT [facility]) - Abnormal; Notable for the following:    APPearance CLOUDY (*)    Leukocytes, UA MODERATE (*)    All other components within normal limits  URINE MICROSCOPIC-ADD ON - Abnormal; Notable for the following:    Squamous Epithelial / LPF 6-30 (*)    All other components within normal limits  CBG MONITORING, ED - Abnormal; Notable for the following:    Glucose-Capillary 125 (*)    All other components within normal limits  CBC    Imaging Review Ct Head Wo Contrast  08/25/2015  CLINICAL DATA:  Dizziness, fell and hit head. EXAM: CT HEAD WITHOUT CONTRAST CT CERVICAL SPINE WITHOUT CONTRAST TECHNIQUE: Multidetector CT imaging of the head and cervical spine was performed following the standard protocol without intravenous contrast. Multiplanar CT image reconstructions of the cervical spine were also generated. COMPARISON:  CT head and CT cervical spine dated 09/09/2012. FINDINGS: CT HEAD FINDINGS There is mild generalized brain atrophy with commensurate dilatation of the ventricles and sulci. There is no mass, hemorrhage, edema, or other evidence of acute parenchymal abnormality. No extra-axial hemorrhage. No osseous fracture or dislocation. Superficial soft tissues are unremarkable. CT CERVICAL SPINE FINDINGS Again noted are degenerative changes within the mid and lower cervical spine, moderate in degree with associated disc space narrowings and mild osseous spurring. Posterior disc-osteophytic bulge at the C4-5 level is causing a moderate central canal stenosis and left neural foramen encroachment with possible associated nerve root impingement. Posterior  disc-osteophytic bulges and uncovertebral joint hypertrophy at the C5-6 and C6-7 levels are causing moderate central canal stenoses and moderate-to-severe bilateral neural foramen stenoses with possible associated nerve root impingements. Osseous alignment is stable. The slight retrolisthesis of C4 is likely related to the underlying degenerative change. No fracture line or displaced fracture fragment identified. Facet joints are well aligned throughout. Atherosclerotic calcifications noted at each carotid bulb region. Paravertebral soft tissues otherwise unremarkable. IMPRESSION: 1. No evidence of acute intracranial abnormality. No intracranial mass, hemorrhage, or edema. No skull fracture. 2. Degenerative changes within the cervical spine, as detailed above. No fracture or acute subluxation  identified within the cervical spine. Electronically Signed   By: Franki Cabot M.D.   On: 08/25/2015 11:22   Ct Cervical Spine Wo Contrast  08/25/2015  CLINICAL DATA:  Dizziness, fell and hit head. EXAM: CT HEAD WITHOUT CONTRAST CT CERVICAL SPINE WITHOUT CONTRAST TECHNIQUE: Multidetector CT imaging of the head and cervical spine was performed following the standard protocol without intravenous contrast. Multiplanar CT image reconstructions of the cervical spine were also generated. COMPARISON:  CT head and CT cervical spine dated 09/09/2012. FINDINGS: CT HEAD FINDINGS There is mild generalized brain atrophy with commensurate dilatation of the ventricles and sulci. There is no mass, hemorrhage, edema, or other evidence of acute parenchymal abnormality. No extra-axial hemorrhage. No osseous fracture or dislocation. Superficial soft tissues are unremarkable. CT CERVICAL SPINE FINDINGS Again noted are degenerative changes within the mid and lower cervical spine, moderate in degree with associated disc space narrowings and mild osseous spurring. Posterior disc-osteophytic bulge at the C4-5 level is causing a moderate central  canal stenosis and left neural foramen encroachment with possible associated nerve root impingement. Posterior disc-osteophytic bulges and uncovertebral joint hypertrophy at the C5-6 and C6-7 levels are causing moderate central canal stenoses and moderate-to-severe bilateral neural foramen stenoses with possible associated nerve root impingements. Osseous alignment is stable. The slight retrolisthesis of C4 is likely related to the underlying degenerative change. No fracture line or displaced fracture fragment identified. Facet joints are well aligned throughout. Atherosclerotic calcifications noted at each carotid bulb region. Paravertebral soft tissues otherwise unremarkable. IMPRESSION: 1. No evidence of acute intracranial abnormality. No intracranial mass, hemorrhage, or edema. No skull fracture. 2. Degenerative changes within the cervical spine, as detailed above. No fracture or acute subluxation identified within the cervical spine. Electronically Signed   By: Franki Cabot M.D.   On: 08/25/2015 11:22   I have personally reviewed and evaluated these images and lab results as part of my medical decision-making.   EKG Interpretation   Date/Time:  Wednesday August 25 2015 10:15:15 EST Ventricular Rate:  90 PR Interval:  212 QRS Duration: 92 QT Interval:  392 QTC Calculation: 480 R Axis:   21 Text Interpretation:  Sinus rhythm Borderline prolonged PR interval  Baseline wander in lead(s) V6 Confirmed by Indica Marcott  MD, Kaleea Penner [phone]) on  08/25/2015 1:43:30 PM      MDM   Final diagnoses:  None    Head contusion with normal CT head. Patient is to follow-up with her PCP as needed    Milton Ferguson, MD 08/25/15 1353

## 2015-08-25 NOTE — ED Notes (Signed)
Per pt, states she stays dizzy-was in bathroom 2 days ago and fell fracturing left foot-states she also hit her head, no LOC-PCP sent her here for eval and possible CT of head

## 2015-08-25 NOTE — Progress Notes (Signed)
ED RN consulted Cm when Daughter in law returned to pt room CM reviewed all information noted in d/c f/u section Cm reviewed the delivery of w/c and HHPT services from [facility] for updates to w/c & HHPT she will need to contact [facility] at [phone] and all further orders come from pcp  Voiced understanding Cm left Cm office # and informed of holiday hours

## 2015-08-25 NOTE — Discharge Instructions (Signed)
Follow up with your md if problems °

## 2015-08-30 ENCOUNTER — Telehealth: Payer: Self-pay | Admitting: Family Medicine

## 2015-08-30 ENCOUNTER — Telehealth: Payer: Self-pay | Admitting: *Deleted

## 2015-08-30 NOTE — Telephone Encounter (Signed)
Melanie Mcdonald called saying she's taking Lasix and recently didn't make it to the bathroom in time resulting in urine getting into the cast on her left foot. She said it's causing a constant burning sensation and very concerned about it. She'd like a phone call regarding this to see if there's anything that can be done. Please call the patient.   Pt ph# [phone] Thank you.

## 2015-08-31 NOTE — Telephone Encounter (Signed)
I spoke with pt and went over the below information, also she will contact the orthopedic office.

## 2015-08-31 NOTE — Telephone Encounter (Signed)
I am not sure what to say. She could try using a blow dryer to dry the skin if possible

## 2015-09-07 DIAGNOSIS — R531 Weakness: Secondary | ICD-10-CM | POA: Diagnosis not present

## 2015-09-07 DIAGNOSIS — M5440 Lumbago with sciatica, unspecified side: Secondary | ICD-10-CM

## 2015-09-07 DIAGNOSIS — I10 Essential (primary) hypertension: Secondary | ICD-10-CM

## 2015-09-07 DIAGNOSIS — G459 Transient cerebral ischemic attack, unspecified: Secondary | ICD-10-CM | POA: Diagnosis not present

## 2015-09-07 DIAGNOSIS — R2689 Other abnormalities of gait and mobility: Secondary | ICD-10-CM

## 2015-09-07 DIAGNOSIS — E119 Type 2 diabetes mellitus without complications: Secondary | ICD-10-CM

## 2015-09-10 ENCOUNTER — Telehealth: Payer: Self-pay | Admitting: Family Medicine

## 2015-09-10 NOTE — Telephone Encounter (Signed)
The order is ready to fax  

## 2015-09-10 NOTE — Telephone Encounter (Signed)
I faxed script to below number. 

## 2015-09-10 NOTE — Telephone Encounter (Signed)
Melanie Mcdonald called saying while her Home Health Nurse Aid was with her, it was suggested that she needs a bedside commode (standard size) since her left foot is broken and she's in a "no weight bearing" cast. An order needs to be faxed to [facility]. Fax# [phone]  Pt's ph# [phone] Thank you.

## 2015-10-01 ENCOUNTER — Telehealth: Payer: Self-pay | Admitting: Family Medicine

## 2015-10-01 MED ORDER — LEVOFLOXACIN 500 MG PO TABS: 500.0000 mg | ORAL_TABLET | Freq: Every day | ORAL | Status: DC

## 2015-10-01 NOTE — Telephone Encounter (Signed)
Patient Name: Melanie Mcdonald  DOB: 06/29/1933    Initial Comment Caller states she has cough, feels weak, chest congestion with difficulty breathing, broken foot and limited mobility   Nurse Assessment  Nurse: Verlin Fester, RN, Stanton Kidney Date/Time Eilene Ghazi Time): 10/01/2015 12:39:16 PM  Confirm and document reason for call. If symptomatic, describe symptoms. ---Patient states she has cough, feels weak, chest congestion with difficulty breathing, broken foot and limited mobility. States she has no chest pain but feels like she is tired and is not getting enough air into her lungs. She can speak in complete sentences and is not having to stop talking to take a breath. States she would like to get some antibiotics called in for this. States she is not coughing a lot and she is still having trouble breathing when she is not coughing.  Has the patient traveled out of the country within the last 30 days? ---No  Does the patient have any new or worsening symptoms? ---Yes  Will a triage be completed? ---Yes  Related visit to physician within the last 2 weeks? ---No  Does the PT have any chronic conditions? (i.e. diabetes, asthma, etc.) ---Yes  List chronic conditions. ---"CHF, arthritis"  Is this a behavioral health or substance abuse call? ---No     Guidelines    Guideline Title Affirmed Question Affirmed Notes  Cough - Acute Productive Difficulty breathing    Final Disposition User   Go to ED Now Verlin Fester, RN, [facility]    Referrals  GO TO FACILITY REFUSED   Disagree/Comply: Disagree  Disagree/Comply Reason: Disagree with instructions   Instructed caller that she will get a call back

## 2015-10-01 NOTE — Telephone Encounter (Signed)
Call in Levaquin 500 mg daily for 10 days  

## 2015-10-01 NOTE — Telephone Encounter (Signed)
Called and spoke with pt and advised of Dr. Barbie Banner recommendations.  Pt states she is not sure if she has taken this before. Pt states she needs a tablet for diarrhea and nausea.  Per Dr. Sarajane Jews pt has had this in the past and pt tolerated this well.  Pt verbalized understanding and she will get the prescription from the pharmacy and get OTC Immodium.

## 2015-10-05 ENCOUNTER — Telehealth: Payer: Self-pay | Admitting: Family Medicine

## 2015-10-05 NOTE — Telephone Encounter (Signed)
Pt called stating that she needs a refill on two controlled substances. She takes Oxycodone and Dexatrim 10 MG. Pt states that she only has one Dexatrim left. Please advise.

## 2015-10-06 MED ORDER — OXYCODONE-ACETAMINOPHEN 10-325 MG PO TABS: 1.0000 | ORAL_TABLET | Freq: Four times a day (QID) | ORAL | Status: DC | PRN

## 2015-10-06 MED ORDER — DEXTROAMPHETAMINE SULFATE ER 10 MG PO CP24: 20.0000 mg | ORAL_CAPSULE | Freq: Every day | ORAL | Status: DC | PRN

## 2015-10-06 NOTE — Telephone Encounter (Signed)
Patient notified that Rx is ready for pick up.

## 2015-10-06 NOTE — Telephone Encounter (Signed)
done

## 2015-10-06 NOTE — Telephone Encounter (Signed)
Left message for patient to return phone call.  

## 2015-10-18 ENCOUNTER — Ambulatory Visit: Payer: Medicare Other | Admitting: Neurology

## 2015-11-29 ENCOUNTER — Ambulatory Visit (INDEPENDENT_AMBULATORY_CARE_PROVIDER_SITE_OTHER): Payer: Medicare Other | Admitting: Family Medicine

## 2015-11-29 ENCOUNTER — Encounter: Payer: Self-pay | Admitting: Family Medicine

## 2015-11-29 VITALS — BP 160/100 | HR 100 | Temp 97.8°F

## 2015-11-29 DIAGNOSIS — M159 Polyosteoarthritis, unspecified: Secondary | ICD-10-CM

## 2015-11-29 DIAGNOSIS — I10 Essential (primary) hypertension: Secondary | ICD-10-CM

## 2015-11-29 DIAGNOSIS — R2681 Unsteadiness on feet: Secondary | ICD-10-CM

## 2015-11-29 DIAGNOSIS — I5033 Acute on chronic diastolic (congestive) heart failure: Secondary | ICD-10-CM

## 2015-11-29 DIAGNOSIS — M150 Primary generalized (osteo)arthritis: Secondary | ICD-10-CM

## 2015-11-29 DIAGNOSIS — I2510 Atherosclerotic heart disease of native coronary artery without angina pectoris: Secondary | ICD-10-CM

## 2015-11-29 DIAGNOSIS — G4730 Sleep apnea, unspecified: Secondary | ICD-10-CM | POA: Diagnosis not present

## 2015-11-29 DIAGNOSIS — M129 Arthropathy, unspecified: Secondary | ICD-10-CM

## 2015-11-29 MED ORDER — METHYLPREDNISOLONE ACETATE 80 MG/ML IJ SUSP
160.0000 mg | Freq: Once | INTRAMUSCULAR | Status: AC
  Administered 2015-11-29: 160 mg via INTRAMUSCULAR

## 2015-11-29 MED ORDER — ZOLPIDEM TARTRATE 10 MG PO TABS: 10.0000 mg | ORAL_TABLET | Freq: Every day | ORAL | Status: DC

## 2015-11-29 MED ORDER — PRAMIPEXOLE DIHYDROCHLORIDE 1 MG PO TABS: 6.0000 mg | ORAL_TABLET | Freq: Every day | ORAL | Status: DC

## 2015-11-29 MED ORDER — FUROSEMIDE 40 MG PO TABS: 40.0000 mg | ORAL_TABLET | Freq: Two times a day (BID) | ORAL | Status: DC

## 2015-11-29 MED ORDER — OXYCODONE-ACETAMINOPHEN 10-325 MG PO TABS: 1.0000 | ORAL_TABLET | Freq: Four times a day (QID) | ORAL | Status: DC | PRN

## 2015-11-29 NOTE — Progress Notes (Signed)
   Subjective:    Patient ID: Melanie Mcdonald, female    DOB: April 27, 1933, 82 y.o.   MRN: [id number]  HPI Here asking for a cortisone shot for her arthritis. She needs some refills. She is using a rolling walker as she recovers from a left metatarsal fracture.    Review of Systems  Constitutional: Negative.   Respiratory: Negative.   Cardiovascular: Negative.   Musculoskeletal: Positive for arthralgias and gait problem.       Objective:   Physical Exam  Constitutional: She is oriented to person, place, and time. She appears well-developed and well-nourished.  Cardiovascular: Normal rate, regular rhythm, normal heart sounds and intact distal pulses.   Pulmonary/Chest: Effort normal and breath sounds normal.  Neurological: She is alert and oriented to person, place, and time.          Assessment & Plan:  She is doing well in general. Given a steroid shot.

## 2015-11-29 NOTE — Progress Notes (Signed)
Pre visit review using our clinic review tool, if applicable. No additional management support is needed unless otherwise documented below in the visit note. Pt unable to weigh 

## 2015-12-17 NOTE — Progress Notes (Signed)
   Subjective:    Patient ID: Melanie Mcdonald, female    DOB: 1932-12-17, 82 y.o.   MRN: [id number]  HPI    Review of Systems     Objective:   Physical Exam        Assessment & Plan:  Her osteoarthritis has been getting worse lately so she was given a shot of DeoMedrol. Her restless legs are stable and the Mirapex was refilled. Her insomnia is stable and her Zolpidem was refilled. Her leg edema is stable and her Lasix was refilled.

## 2015-12-23 ENCOUNTER — Other Ambulatory Visit: Payer: Self-pay | Admitting: Family Medicine

## 2015-12-24 ENCOUNTER — Encounter: Payer: Self-pay | Admitting: Family Medicine

## 2015-12-24 ENCOUNTER — Ambulatory Visit (INDEPENDENT_AMBULATORY_CARE_PROVIDER_SITE_OTHER): Payer: Medicare Other | Admitting: Family Medicine

## 2015-12-24 VITALS — BP 142/82 | HR 94 | Temp 98.2°F | Ht 67.5 in

## 2015-12-24 DIAGNOSIS — E119 Type 2 diabetes mellitus without complications: Secondary | ICD-10-CM | POA: Diagnosis not present

## 2015-12-24 DIAGNOSIS — M25579 Pain in unspecified ankle and joints of unspecified foot: Secondary | ICD-10-CM

## 2015-12-24 DIAGNOSIS — J209 Acute bronchitis, unspecified: Secondary | ICD-10-CM | POA: Diagnosis not present

## 2015-12-24 HISTORY — DX: Pain in unspecified ankle and joints of unspecified foot: M25.579

## 2015-12-24 MED ORDER — LEVOFLOXACIN 500 MG PO TABS: 500.0000 mg | ORAL_TABLET | Freq: Every day | ORAL | Status: AC

## 2015-12-24 MED ORDER — HYDROCODONE-HOMATROPINE 5-1.5 MG/5ML PO SYRP: 5.0000 mL | ORAL_SOLUTION | ORAL | Status: DC | PRN

## 2015-12-24 NOTE — Progress Notes (Signed)
Pre visit review using our clinic review tool, if applicable. No additional management support is needed unless otherwise documented below in the visit note. 

## 2015-12-24 NOTE — Progress Notes (Signed)
   Subjective:    Patient ID: Melanie Mcdonald, female    DOB: 11/14/1932, 82 y.o.   MRN: [id number]  HPI Here for 5 days of chest congestion and a dry cough. No fever.    Review of Systems  Constitutional: Negative.   HENT: Positive for congestion. Negative for ear pain, postnasal drip, sinus pressure and sore throat.   Eyes: Negative.   Respiratory: Positive for cough and chest tightness.        Objective:   Physical Exam  Constitutional: She appears well-developed and well-nourished.  Cardiovascular: Normal rate, regular rhythm, normal heart sounds and intact distal pulses.           Assessment & Plan:  Bronchitis, treat with Levaquin

## 2015-12-28 ENCOUNTER — Telehealth: Payer: Self-pay | Admitting: Family Medicine

## 2015-12-28 NOTE — Telephone Encounter (Signed)
Patient Name: ALONI OROCIO DOB: Apr 14, 1933 Initial Comment Caller states she is diabetic and legs are swollenhas water blisters- some of them have been busted- Nurse Assessment Nurse: Vallery Sa, RN, Cathy Date/Time (Eastern Time): 12/28/2015 11:16:32 AM Confirm and document reason for call. If symptomatic, describe symptoms. You must click the next button to save text entered. ---Caller states she is a Diabetic and she developed swelling of her legs with blisters present about two days ago. She bumped her right leg and some of the blisters are now open. She has concerns they may become infected. No fever. No sores on her feet. Alert and responsive. No severe breathing difficulty. No chest pain. Has the patient traveled out of the country within the last 30 days? ---No Does the patient have any new or worsening symptoms? ---Yes Will a triage be completed? ---Yes Related visit to physician within the last 2 weeks? ---No Does the PT have any chronic conditions? (i.e. diabetes, asthma, etc.) ---Yes List chronic conditions. ---CHF, Diabetes, Started on antibiotic for Bronchitis 4 days ago Is this a behavioral health or substance abuse call? ---No Guidelines Guideline Title Affirmed Question Affirmed Notes Wound Infection Black (necrotic) or blisters develop in wound Final Disposition User Go to ED Now (or PCP triage) Vallery Sa, RN, Cathy Comments No appointments available. Called declines the Go to ER disposition due to extensive medical bills. Called the office backline and Estill Bamberg shares she will have the MD/MD Nurse call Gitty back. Cerissa updated. Referrals GO TO FACILITY OTHER - SPECIFY Disagree/Comply: Disagree Disagree/Comply Reason: Disagree with instructions

## 2015-12-28 NOTE — Telephone Encounter (Signed)
Patient notified of Dr. Murrell Redden comments. Patient verbalized understanding.

## 2015-12-28 NOTE — Telephone Encounter (Signed)
Will route to PCP- please advise.

## 2015-12-28 NOTE — Telephone Encounter (Signed)
Tell her to increase the Lasix to 40 mg two tabs (total of 80 mg) twice daily for one week, then go back to one tab bid. Follow up prn

## 2015-12-30 LAB — HM DIABETES EYE EXAM

## 2016-01-06 ENCOUNTER — Encounter: Payer: Self-pay | Admitting: Podiatry

## 2016-01-06 ENCOUNTER — Ambulatory Visit (INDEPENDENT_AMBULATORY_CARE_PROVIDER_SITE_OTHER): Payer: Medicare Other | Admitting: Podiatry

## 2016-01-06 DIAGNOSIS — M79672 Pain in left foot: Secondary | ICD-10-CM

## 2016-01-06 DIAGNOSIS — E1140 Type 2 diabetes mellitus with diabetic neuropathy, unspecified: Secondary | ICD-10-CM

## 2016-01-06 DIAGNOSIS — Q828 Other specified congenital malformations of skin: Secondary | ICD-10-CM | POA: Diagnosis not present

## 2016-01-06 DIAGNOSIS — B351 Tinea unguium: Secondary | ICD-10-CM

## 2016-01-06 DIAGNOSIS — E1149 Type 2 diabetes mellitus with other diabetic neurological complication: Secondary | ICD-10-CM

## 2016-01-06 DIAGNOSIS — M79671 Pain in right foot: Secondary | ICD-10-CM

## 2016-01-06 DIAGNOSIS — M79604 Pain in right leg: Secondary | ICD-10-CM

## 2016-01-06 DIAGNOSIS — M79605 Pain in left leg: Secondary | ICD-10-CM

## 2016-01-06 NOTE — Progress Notes (Signed)
   Subjective:    Patient ID: Melanie Mcdonald, female    DOB: October 29, 1932, 82 y.o.   MRN: [id number]  HPI Pt presents with difficulty cutting her nails   Review of Systems  All other systems reviewed and are negative.      Objective:   Physical Exam        Assessment & Plan:

## 2016-01-07 NOTE — Progress Notes (Signed)
Subjective:     Patient ID: Melanie Mcdonald, female   DOB: 12/03/1932, 82 y.o.   MRN: [id number]  HPI patient presents with nail disease 1-5 both feet and lesions and states that she's not able to take care of them herself and she's tried to take care of them herself and has tried different types of cushions   Review of Systems  All other systems reviewed and are negative.      Objective:   Physical Exam  Constitutional: She is oriented to person, place, and time.  Musculoskeletal: Normal range of motion.  Neurological: She is oriented to person, place, and time.  Skin: Skin is warm and dry.  Nursing note and vitals reviewed.  Patient has mild diminishment of pulses bilateral has sharp dull and vibratory that are intact and is found to have thick nailbeds 1-5 both feet that are brittle yellow and hard for her to cut along with lesions on the bottom of both feet that are keratotic and difficult to cut. Patient has structural deformities of both feet of significant nature in his never pursued correction     Assessment:     HAV hammertoe with keratotic lesions and thick nail disease 1-5 both feet    Plan:     H&P and all conditions reviewed with patient. Today debridement of nails accomplished and debridement a lesion was accomplished bilateral and tolerated well

## 2016-01-19 ENCOUNTER — Ambulatory Visit (INDEPENDENT_AMBULATORY_CARE_PROVIDER_SITE_OTHER): Payer: Medicare Other | Admitting: Family Medicine

## 2016-01-19 ENCOUNTER — Encounter: Payer: Self-pay | Admitting: Family Medicine

## 2016-01-19 ENCOUNTER — Telehealth: Payer: Self-pay | Admitting: Family Medicine

## 2016-01-19 VITALS — BP 136/78 | HR 90 | Temp 98.3°F | Ht 67.5 in

## 2016-01-19 DIAGNOSIS — S335XXA Sprain of ligaments of lumbar spine, initial encounter: Secondary | ICD-10-CM

## 2016-01-19 DIAGNOSIS — M25561 Pain in right knee: Secondary | ICD-10-CM

## 2016-01-19 DIAGNOSIS — S8391XA Sprain of unspecified site of right knee, initial encounter: Secondary | ICD-10-CM

## 2016-01-19 DIAGNOSIS — R58 Hemorrhage, not elsewhere classified: Secondary | ICD-10-CM | POA: Diagnosis not present

## 2016-01-19 DIAGNOSIS — M545 Low back pain: Secondary | ICD-10-CM

## 2016-01-19 DIAGNOSIS — M25562 Pain in left knee: Secondary | ICD-10-CM | POA: Diagnosis not present

## 2016-01-19 NOTE — Telephone Encounter (Signed)
Patient Name: Melanie Mcdonald  DOB: [birth year]    Initial Comment Caller states fell down stairs Saturday, has a hard knot and bad bruise on abd   Nurse Assessment  Nurse: Raphael Gibney, RN, Vanita Ingles Date/Time (Eastern Time): 01/19/2016 9:19:00 AM  Confirm and document reason for call. If symptomatic, describe symptoms. You must click the next button to save text entered. ---Caller states she fell down the stairs on Saturday. She has bruise on her abd on her right hip that is 6 inches across and 6 inches in width. Has knot on her abd which is tender to the touch and has gotten bigger in a few min. Knot is 2-3 inches long and is in the bruise.  Has the patient traveled out of the country within the last 30 days? ---Not Applicable  Does the patient have any new or worsening symptoms? ---Yes  Will a triage be completed? ---Yes  Related visit to physician within the last 2 weeks? ---No  Does the PT have any chronic conditions? (i.e. diabetes, asthma, etc.) ---Yes  List chronic conditions. ---diabetes; arthritis;  Is this a behavioral health or substance abuse call? ---No     Guidelines    Guideline Title Affirmed Question Affirmed Notes  Bruises [1] Raised bruise AND [2] size > 2 inches (5 cm) AND [3] expanding    Final Disposition User   See Physician within 4 Hours (or PCP triage) Raphael Gibney, RN, Vera    Comments  appt scheduled for 01/19/16 at 11:15 am with Dr. Colin Benton at [facility]   Referrals  REFERRED TO PCP OFFICE   Disagree/Comply: Leta Baptist

## 2016-01-19 NOTE — Progress Notes (Signed)
HPI:  Melanie Mcdonald is a pleasant 81 year old here for an acute visit for a bruise. She reports she has a long history of frequent falls, which she reports her primary doctor is aware of. She tripped on some steps and landed on her stomach 5 days ago. She reports she really felt okay after the fall without any significant pain at the time, however she has developed a bruise where she hit the step. This is on the fatty area of her lower right belly. She does not have much discomfort in this area except for with pressing on the bruise. She has developed some pain in both of her knees and her low back over the last 2 days. However, she reports she has chronic pain in these areas. She sees a specialist for this, and takes as needed pain medications as well. Denies weakness, numbness, radiation of pain, changes in bowels, blood in urine or bowels, fevers, nausea, vomiting, malaise or chills. She reports she is going to need some refills on a number of her chronic medications soon but she was unsure of which ones.,   ROS: See pertinent positives and negatives per HPI.  Past Medical History  Diagnosis Date  . ANEMIA 01/30/2008  . ARTHRITIS 12/11/2007  . BACK PAIN, CHRONIC 06/23/2008  . BREAST CYST, RIGHT 12/17/2007  . CONTACT DERMATITIS 03/10/2009  . CORONARY ARTERY DISEASE 03/01/2007    had a normal Myoview stress test 07-13-11  . CYSTOCELE WITHOUT MENTION UTERINE PROLAPSE LAT 09/12/2010  . DEGENERATIVE JOINT DISEASE 08/07/2007  . DEPRESSION 03/01/2007  . Esophageal reflux 11/06/2007  . HYPERKERATOSIS 06/02/2009  . HYPERLIPIDEMIA 08/26/2008  . HYPERTENSION 03/01/2007  . Irritable bowel syndrome 01/26/2009  . LABYRINTHITIS 11/03/2009  . Narcolepsy without cataplexy 08/26/2008  . OBESITY 03/01/2007  . PERIPHERAL VASCULAR DISEASE 01/26/2009  . SYNCOPE 08/26/2008    had brain MRI on 06-28-12 showing only chronic microvascular ischemia and atrophy   . TRANSIENT ISCHEMIC ATTACK 10/20/2009    had normal brain MRA  with patent vertebrals and carotids 06-28-12  . NEPHROLITHIASIS 04/29/2009  . COPD (chronic obstructive pulmonary disease) ([place])   . Colon polyps     FRAGMENTS OF HYPERPLASTIC POLYP  . Diverticulosis of colon (without mention of hemorrhage)   . DIABETES MELLITUS, TYPE II 08/26/2008    sees Dr. Dwyane Dee     Past Surgical History  Procedure Laterality Date  . Cataract extraction    . Abdominal hysterectomy    . Back surgery      x2  . Tonsilectomy, adenoidectomy, bilateral myringotomy and tubes    . Spine surgery      x 3  . Cardiac catheterization  07/2009  . Intraocular lens insertion    . Esophagogastroduodenoscopy  11-13-07    per Dr. Deatra Ina, normal   . Colonoscopy  11-13-07    per Dr. Deatra Ina, benign polyps, repeat in 5 yrs    Family History  Problem Relation Age of Onset  . Lupus Mother   . COPD Father     Social History   Social History  . Marital Status: Widowed    Spouse Name: N/A  . Number of Children: N/A  . Years of Education: N/A   Social History Main Topics  . Smoking status: Never Smoker   . Smokeless tobacco: Never Used  . Alcohol Use: No  . Drug Use: No  . Sexual Activity: No   Other Topics Concern  . None   Social History Narrative     Current  outpatient prescriptions:  .  albuterol (PROVENTIL HFA;VENTOLIN HFA) 108 (90 BASE) MCG/ACT inhaler, Inhale 2 puffs into the lungs every 4 (four) hours as needed for wheezing or shortness of breath., Disp: 1 Inhaler, Rfl: 3 .  buPROPion (WELLBUTRIN XL) 150 MG 24 hr tablet, Take 1 tablet (150 mg total) by mouth daily., Disp: 90 tablet, Rfl: 3 .  cyclobenzaprine (FLEXERIL) 10 MG tablet, Take 1 tablet (10 mg total) by mouth at bedtime., Disp: 30 tablet, Rfl: 5 .  diclofenac sodium (VOLTAREN) 1 % GEL, Apply 4 g topically 4 (four) times daily as needed (for arthritis pain in shoulders and fingers). For joint pain., Disp: , Rfl:  .  fluticasone (FLONASE) 50 MCG/ACT nasal spray, Place 2 sprays into both nostrils  daily., Disp: 16 g, Rfl: 1 .  Fluticasone-Salmeterol (ADVAIR DISKUS) 250-50 MCG/DOSE AEPB, Inhale 1 puff into the lungs 2 (two) times daily., Disp: 60 each, Rfl: 6 .  furosemide (LASIX) 40 MG tablet, Take 1 tablet (40 mg total) by mouth 2 (two) times daily. TAKE 1 IN THE MORNING AND ONE IN THE EVENING, Disp: 60 tablet, Rfl: 11 .  gabapentin (NEURONTIN) 300 MG capsule, Take 1 capsule (300 mg total) by mouth 3 (three) times daily., Disp: 90 capsule, Rfl: 5 .  HYDROcodone-homatropine (HYDROMET) 5-1.5 MG/5ML syrup, Take 5 mLs by mouth every 4 (four) hours as needed., Disp: 240 mL, Rfl: 0 .  Insulin Aspart Prot & Aspart (NOVOLOG 70/30 MIX) (70-30) 100 UNIT/ML Pen, Take 25 units with breakfast, 15 units with lunch and 25 units with dinner, Disp: 45 mL, Rfl: 0 .  Insulin Glargine (LANTUS SOLOSTAR) 100 UNIT/ML Solostar Pen, INJECT 100 UNITS UNDER THE SKIN AT BEDTIME and inject 50 units in the morning, Disp: 135 mL, Rfl: 3 .  ketoconazole (NIZORAL) 2 % cream, Apply 1 application topically 2 (two) times daily as needed for irritation., Disp: 60 g, Rfl: 5 .  LANTUS SOLOSTAR 100 UNIT/ML Solostar Pen, INJECT 100 UNITS UNDER THE SKIN AT BEDTIME AND 50 UNITS IN THE MORNING, Disp: 135 mL, Rfl: 0 .  meclizine (ANTIVERT) 25 MG tablet, Take 1 tablet (25 mg total) by mouth every 4 (four) hours as needed for dizziness., Disp: 300 tablet, Rfl: 3 .  metolazone (ZAROXOLYN) 2.5 MG tablet, Take 1 tablet (2.5 mg total) by mouth daily. TAKE THIS 30 MINUTES PRIOR TO YOUR MORNING DOSE OF LASIX, Disp: 90 tablet, Rfl: 3 .  nitroGLYCERIN (NITROSTAT) 0.4 MG SL tablet, Place 1 tablet (0.4 mg total) under the tongue every 5 (five) minutes as needed for chest pain., Disp: 50 tablet, Rfl: 5 .  ondansetron (ZOFRAN) 8 MG tablet, Take 1 tablet (8 mg total) by mouth every 8 (eight) hours as needed for nausea or vomiting., Disp: 90 tablet, Rfl: 5 .  oxyCODONE-acetaminophen (PERCOCET) 10-325 MG tablet, Take 1 tablet by mouth every 6 (six) hours  as needed for pain., Disp: 120 tablet, Rfl: 0 .  phenazopyridine (PYRIDIUM) 200 MG tablet, Take 200 mg by mouth 3 (three) times daily as needed for pain., Disp: , Rfl:  .  pramipexole (MIRAPEX) 1 MG tablet, Take 6 tablets (6 mg total) by mouth daily., Disp: 180 tablet, Rfl: 5 .  zolpidem (AMBIEN) 10 MG tablet, Take 1 tablet (10 mg total) by mouth at bedtime., Disp: 30 tablet, Rfl: 5  EXAM:  Filed Vitals:   01/19/16 1129  BP: 136/78  Pulse: 90  Temp: 98.3 F (36.8 C)    There is no weight on file to calculate  BMI.  GENERAL: vitals reviewed and listed above, alert, oriented, appears well hydrated and in no acute distress  HEENT: atraumatic, conjunttiva clear, no obvious abnormalities on inspection of external nose and ears  NECK: no obvious masses on inspection  LUNGS: clear to auscultation bilaterally, no wheezes, rales or rhonchi, good air movement  CV: HRRR, no peripheral edema  ABD: Bowel sounds positive, soft, no rebound or guarding - mild tenderness to superficial palpation directly on the bruised area in the R  pain region, she has a healed old surgical scar in this area with what feels like scar tissue around the scar   MS: moves all extremities without noticeable abnormality, she walks with a cane, she is cautious, she has mild tenderness to palpation in the right lumbar paraspinal muscles and gluts, he has mild bilateral medial and lateral knee joint tenderness to palpation without significant swelling or appreciable deformity or laxity of this joint.  PSYCH: pleasant and cooperative, no obvious depression or anxiety  ASSESSMENT AND PLAN:  Discussed the following assessment and plan:  Ecchymosis  Low back pain without sciatica, unspecified back pain laterality  Arthralgia of both knees  -We discussed that since most of her pain had an onset several days after her fall, it is unlikely that she suffered any bony injury or significant soft tissue injury - and this is  likely more mild strain  -The bruised is somewhat impressive, but she does not seem to have much pain or any signs or symptoms to suggest internal injury, there is mild density of the tissue around an old surgical scar -we discussed that this may be scar tissue or there could be a small superficial hematoma in this area  -Discussed potential options for evaluation, she has opted to use ice and her pain medications with close follow-up  -Advised she review her medications with my assistant to see if she needs any urgent need refills that we can provide today, otherwise advised that she address chronic medication refills with her primary doctor at follow-up.  -Patient advised to return or notify a doctor immediately if symptoms worsen or persist or new concerns arise.  Patient Instructions  Before you leave: -Schedule follow up with your doctor in about 5-7 days  Please use ice on the area where you are bruised.  You can use her pain medication as needed per instructions.  These follow-up sooner if your symptoms are worsening or you have new concerns.  Please use extreme caution with ambulation and use her cane or walker.     Colin Benton R.

## 2016-01-19 NOTE — Progress Notes (Signed)
Pre visit review using our clinic review tool, if applicable. No additional management support is needed unless otherwise documented below in the visit note. 

## 2016-01-19 NOTE — Patient Instructions (Addendum)
Before you leave: -Schedule follow up with your doctor in about 5-7 days  Please use ice on the area where you are bruised.  You can use her pain medication as needed per instructions.  These follow-up sooner if your symptoms are worsening or you have new concerns.  Please use extreme caution with ambulation and use her cane or walker.

## 2016-01-19 NOTE — Telephone Encounter (Signed)
FYI

## 2016-01-20 ENCOUNTER — Telehealth: Payer: Self-pay

## 2016-01-20 NOTE — Telephone Encounter (Signed)
Pt saw Dr Maudie Mercury yesterday for the bruise on her stomach. Pt states now it is a hard knot about 2 inches long and 3 inches wide. Pt states dr Maudie Mercury said something about a contrast dye test. Pt is concerned she is bleeding inside. Pt is upset no one has called her back. Wants to know if Dr Maudie Mercury wants her to come in again

## 2016-01-20 NOTE — Telephone Encounter (Signed)
Spoke with patient and an appointment made 

## 2016-01-20 NOTE — Telephone Encounter (Signed)
Discussed with PCP and he will take care of this as knows pt well.

## 2016-01-20 NOTE — Telephone Encounter (Signed)
Patient fell a couple days ago.  She has a knot and bruise from a fall.  She was seen in the office but is still concerned.  The after hours nurse advised patient to go to the emergency room.  Patient would like to know what Dr Sarajane Jews recommends.  Please advise.

## 2016-01-20 NOTE — Telephone Encounter (Signed)
I reviewed her case today with Dr. Maudie Mercury, and I really do not think she is having any internal bleeding. Please reassure her and offer an OV with me tomorrow to re-evaluate her

## 2016-01-21 ENCOUNTER — Encounter: Payer: Self-pay | Admitting: Family Medicine

## 2016-01-21 ENCOUNTER — Ambulatory Visit (INDEPENDENT_AMBULATORY_CARE_PROVIDER_SITE_OTHER): Payer: Medicare Other | Admitting: Family Medicine

## 2016-01-21 VITALS — BP 150/84 | HR 84 | Temp 98.1°F

## 2016-01-21 DIAGNOSIS — M545 Low back pain, unspecified: Secondary | ICD-10-CM

## 2016-01-21 DIAGNOSIS — S301XXD Contusion of abdominal wall, subsequent encounter: Secondary | ICD-10-CM

## 2016-01-21 DIAGNOSIS — S8391XD Sprain of unspecified site of right knee, subsequent encounter: Secondary | ICD-10-CM | POA: Diagnosis not present

## 2016-01-21 DIAGNOSIS — I10 Essential (primary) hypertension: Secondary | ICD-10-CM

## 2016-01-21 LAB — BASIC METABOLIC PANEL
BUN: 18 mg/dL (ref 6–23)
CHLORIDE: 103 meq/L (ref 96–112)
CO2: 31 meq/L (ref 19–32)
Calcium: 9.5 mg/dL (ref 8.4–10.5)
Creatinine, Ser: 0.66 mg/dL (ref 0.40–1.20)
GFR: 91.47 mL/min (ref >60.00)
Glucose, Bld: 85 mg/dL (ref 70–99)
Potassium: 4.2 mEq/L (ref 3.5–5.1)
SODIUM: 140 meq/L (ref 135–145)

## 2016-01-21 MED ORDER — NITROGLYCERIN 0.4 MG SL SUBL: 0.4000 mg | SUBLINGUAL_TABLET | SUBLINGUAL | Status: DC | PRN

## 2016-01-21 MED ORDER — OXYCODONE-ACETAMINOPHEN 10-325 MG PO TABS: 1.0000 | ORAL_TABLET | Freq: Four times a day (QID) | ORAL | Status: DC | PRN

## 2016-01-21 MED ORDER — PHENAZOPYRIDINE HCL 200 MG PO TABS: 200.0000 mg | ORAL_TABLET | Freq: Three times a day (TID) | ORAL | Status: DC | PRN

## 2016-01-21 NOTE — Progress Notes (Signed)
Pre visit review using our clinic review tool, if applicable. No additional management support is needed unless otherwise documented below in the visit note. Pt unable to stand and weigh.   

## 2016-01-21 NOTE — Progress Notes (Signed)
   Subjective:    Patient ID: Melanie Mcdonald, female    DOB: 10-Oct-1932, 82 y.o.   MRN: [id number]  HPI Here to check on injuries from a fall at home on 01-15-16. She slipped and fell down some steps, landing on her side and bottom. She thinks she also struck her abdomen on the end of a handrail on the way down. She immediately developed a large bruise on the abdomen, and she was seen here on 01-19-16 for this. She was bruised and tender and had a hematoma, but she seemed okay otherwise. Since then the bruising has been fading away as expected, but the tender firm lump remains. Her BMs and urinations are normal. Now she has also developed a lot of pain in the right knee and in the lower back. Her back has severe pain when she stands up or walks. No pain or weakness in the legs.    Review of Systems  Constitutional: Negative.   Respiratory: Negative.   Cardiovascular: Negative.   Gastrointestinal: Positive for abdominal pain. Negative for nausea, vomiting, diarrhea, constipation, blood in stool, abdominal distention, anal bleeding and rectal pain.  Genitourinary: Negative.   Musculoskeletal: Positive for back pain, arthralgias and gait problem.       Objective:   Physical Exam  Constitutional:  In pain, walks with a cane very slowly   Cardiovascular: Normal rate, regular rhythm, normal heart sounds and intact distal pulses.   Pulmonary/Chest: Effort normal and breath sounds normal.  Abdominal: Soft. Bowel sounds are normal. She exhibits no distension. There is no rebound and no guarding.  There is a large area of ecchymosis over the right RLQ which is mildly tender. Within this are is a small firm mobile mass consistent with a hematoma.   Musculoskeletal:  She is quite tender in both the medial and lateral joint spaces of the right knee. ROM is reduced and there is moderate swelling. She is very tender over the lower spine just above the pelvis. ROM is reduced by pain.             Assessment & Plan:  She has a hematoma on the lower abdominal wall. This is painful but not harmful, and I reassured her this would slowly resolve over the next 2-3 months. She has also injured the right knee with possible torn cartilage, and she has injured the lower spine with a possible compression fracture . Use Percocet for pain. Set up MRI scans of the lumbar spine and the right knee.  Laurey Morale, MD

## 2016-01-24 ENCOUNTER — Ambulatory Visit: Payer: Medicare Other | Admitting: Family Medicine

## 2016-01-24 NOTE — Telephone Encounter (Signed)
I spoke with pt, not sure if we can get her in any sooner. Can you find out if they can see her any sooner?

## 2016-01-24 NOTE — Telephone Encounter (Signed)
Pt would like to know if you could get her in to get an MRI before 4/30 @12 :55 which is when she has been scheduled or is there another facility that would take her some time this week.

## 2016-01-26 NOTE — Telephone Encounter (Signed)
No we can only do MRI of lower spine and right knee as ordered. We could evaluate the pelvis and shoulders with plain Xrays at some point

## 2016-01-26 NOTE — Telephone Encounter (Signed)
Pt would like to know if you could add to the contrast MRI that she will be taken in a few day pts R pelvic and shoulder b/cshe is in a lot of pain.

## 2016-01-27 ENCOUNTER — Ambulatory Visit
Admission: RE | Admit: 2016-01-27 | Discharge: 2016-01-27 | Disposition: A | Discharge status: Home / Self Care | Payer: Medicare Other | Source: Ambulatory Visit | Attending: Family Medicine | Admitting: Family Medicine

## 2016-01-27 ENCOUNTER — Inpatient Hospital Stay: Admission: RE | Admit: 2016-01-27 | Payer: Medicare Other | Source: Ambulatory Visit

## 2016-01-27 DIAGNOSIS — M545 Low back pain, unspecified: Secondary | ICD-10-CM

## 2016-01-27 DIAGNOSIS — S8391XA Sprain of unspecified site of right knee, initial encounter: Secondary | ICD-10-CM

## 2016-01-27 MED ORDER — GADOBENATE DIMEGLUMINE 529 MG/ML IV SOLN
20.0000 mL | Freq: Once | INTRAVENOUS | Status: AC | PRN
  Administered 2016-01-27: 20 mL via INTRAVENOUS

## 2016-01-27 NOTE — Telephone Encounter (Signed)
Melanie Mcdonald with [facility] referral sent for knee with contrast is no applicable to a knee sprain.  The only time  they would add contrast is if they were looking for a mass. Pt has appointment there today at 3:15 and would like to know if you can re send the referral to states w/o contrast?

## 2016-01-27 NOTE — Telephone Encounter (Signed)
Per Dr. Sarajane Jews, change both orders to read without contrast. I did cancel previous orders and put in new ones.

## 2016-01-27 NOTE — Telephone Encounter (Signed)
I spoke with pt, if shoulder does not start to improve then she will request a xray.

## 2016-01-28 ENCOUNTER — Telehealth: Payer: Self-pay | Admitting: Family Medicine

## 2016-01-28 DIAGNOSIS — S322XXA Fracture of coccyx, initial encounter for closed fracture: Secondary | ICD-10-CM

## 2016-01-28 DIAGNOSIS — S3210XA Unspecified fracture of sacrum, initial encounter for closed fracture: Secondary | ICD-10-CM

## 2016-01-28 DIAGNOSIS — S83206A Unspecified tear of unspecified meniscus, current injury, right knee, initial encounter: Secondary | ICD-10-CM

## 2016-01-28 NOTE — Telephone Encounter (Signed)
The referral was done  

## 2016-01-28 NOTE — Telephone Encounter (Signed)
Pt would like referral to Dr. Maxie Better.

## 2016-01-30 ENCOUNTER — Other Ambulatory Visit: Payer: Medicare Other

## 2016-02-02 ENCOUNTER — Encounter (HOSPITAL_COMMUNITY): Payer: Self-pay | Admitting: *Deleted

## 2016-02-02 ENCOUNTER — Encounter: Payer: Self-pay | Admitting: Family Medicine

## 2016-02-02 ENCOUNTER — Ambulatory Visit (INDEPENDENT_AMBULATORY_CARE_PROVIDER_SITE_OTHER): Payer: Medicare Other | Admitting: Family Medicine

## 2016-02-02 ENCOUNTER — Emergency Department (HOSPITAL_COMMUNITY): Payer: Medicare Other

## 2016-02-02 ENCOUNTER — Emergency Department (HOSPITAL_COMMUNITY)
Admission: EM | Admit: 2016-02-02 | Discharge: 2016-02-02 | Disposition: A | Discharge status: Home / Self Care | Payer: Medicare Other | Attending: Emergency Medicine | Admitting: Emergency Medicine

## 2016-02-02 VITALS — BP 120/60 | HR 76 | Temp 98.3°F

## 2016-02-02 DIAGNOSIS — J449 Chronic obstructive pulmonary disease, unspecified: Secondary | ICD-10-CM | POA: Diagnosis not present

## 2016-02-02 DIAGNOSIS — Z872 Personal history of diseases of the skin and subcutaneous tissue: Secondary | ICD-10-CM | POA: Insufficient documentation

## 2016-02-02 DIAGNOSIS — M1990 Unspecified osteoarthritis, unspecified site: Secondary | ICD-10-CM | POA: Diagnosis not present

## 2016-02-02 DIAGNOSIS — Y9389 Activity, other specified: Secondary | ICD-10-CM | POA: Diagnosis not present

## 2016-02-02 DIAGNOSIS — Z8719 Personal history of other diseases of the digestive system: Secondary | ICD-10-CM | POA: Insufficient documentation

## 2016-02-02 DIAGNOSIS — Z862 Personal history of diseases of the blood and blood-forming organs and certain disorders involving the immune mechanism: Secondary | ICD-10-CM | POA: Diagnosis not present

## 2016-02-02 DIAGNOSIS — K625 Hemorrhage of anus and rectum: Secondary | ICD-10-CM | POA: Insufficient documentation

## 2016-02-02 DIAGNOSIS — W108XXA Fall (on) (from) other stairs and steps, initial encounter: Secondary | ICD-10-CM | POA: Diagnosis not present

## 2016-02-02 DIAGNOSIS — Z87442 Personal history of urinary calculi: Secondary | ICD-10-CM | POA: Insufficient documentation

## 2016-02-02 DIAGNOSIS — Y998 Other external cause status: Secondary | ICD-10-CM | POA: Diagnosis not present

## 2016-02-02 DIAGNOSIS — R42 Dizziness and giddiness: Secondary | ICD-10-CM | POA: Diagnosis not present

## 2016-02-02 DIAGNOSIS — I2510 Atherosclerotic heart disease of native coronary artery without angina pectoris: Secondary | ICD-10-CM | POA: Insufficient documentation

## 2016-02-02 DIAGNOSIS — Z794 Long term (current) use of insulin: Secondary | ICD-10-CM | POA: Insufficient documentation

## 2016-02-02 DIAGNOSIS — Z90710 Acquired absence of both cervix and uterus: Secondary | ICD-10-CM | POA: Insufficient documentation

## 2016-02-02 DIAGNOSIS — R1030 Lower abdominal pain, unspecified: Secondary | ICD-10-CM | POA: Diagnosis not present

## 2016-02-02 DIAGNOSIS — E669 Obesity, unspecified: Secondary | ICD-10-CM | POA: Insufficient documentation

## 2016-02-02 DIAGNOSIS — Z880 Allergy status to penicillin: Secondary | ICD-10-CM | POA: Insufficient documentation

## 2016-02-02 DIAGNOSIS — Z7952 Long term (current) use of systemic steroids: Secondary | ICD-10-CM | POA: Insufficient documentation

## 2016-02-02 DIAGNOSIS — Z8742 Personal history of other diseases of the female genital tract: Secondary | ICD-10-CM | POA: Diagnosis not present

## 2016-02-02 DIAGNOSIS — G8929 Other chronic pain: Secondary | ICD-10-CM | POA: Diagnosis not present

## 2016-02-02 DIAGNOSIS — Z7951 Long term (current) use of inhaled steroids: Secondary | ICD-10-CM | POA: Insufficient documentation

## 2016-02-02 DIAGNOSIS — R531 Weakness: Secondary | ICD-10-CM

## 2016-02-02 DIAGNOSIS — S301XXA Contusion of abdominal wall, initial encounter: Secondary | ICD-10-CM | POA: Diagnosis not present

## 2016-02-02 DIAGNOSIS — S3992XA Unspecified injury of lower back, initial encounter: Secondary | ICD-10-CM | POA: Insufficient documentation

## 2016-02-02 DIAGNOSIS — Y9289 Other specified places as the place of occurrence of the external cause: Secondary | ICD-10-CM | POA: Insufficient documentation

## 2016-02-02 DIAGNOSIS — Z86010 Personal history of colonic polyps: Secondary | ICD-10-CM | POA: Diagnosis not present

## 2016-02-02 DIAGNOSIS — R5383 Other fatigue: Secondary | ICD-10-CM | POA: Insufficient documentation

## 2016-02-02 DIAGNOSIS — I10 Essential (primary) hypertension: Secondary | ICD-10-CM | POA: Insufficient documentation

## 2016-02-02 DIAGNOSIS — E119 Type 2 diabetes mellitus without complications: Secondary | ICD-10-CM | POA: Diagnosis not present

## 2016-02-02 DIAGNOSIS — S3991XA Unspecified injury of abdomen, initial encounter: Secondary | ICD-10-CM | POA: Diagnosis present

## 2016-02-02 DIAGNOSIS — Z79899 Other long term (current) drug therapy: Secondary | ICD-10-CM | POA: Insufficient documentation

## 2016-02-02 DIAGNOSIS — R1031 Right lower quadrant pain: Secondary | ICD-10-CM

## 2016-02-02 LAB — URINALYSIS, ROUTINE W REFLEX MICROSCOPIC
Bilirubin Urine: NEGATIVE
Glucose, UA: >1000 mg/dL — AB
Hgb urine dipstick: NEGATIVE
Ketones, ur: NEGATIVE mg/dL
LEUKOCYTES UA: NEGATIVE
Nitrite: NEGATIVE
PROTEIN: NEGATIVE mg/dL
SPECIFIC GRAVITY, URINE: 1.036 — AB (ref 1.005–1.030)
pH: 5.5 (ref 5.0–8.0)

## 2016-02-02 LAB — COMPREHENSIVE METABOLIC PANEL
ALBUMIN: 3.6 g/dL (ref 3.5–5.0)
ALK PHOS: 117 U/L (ref 38–126)
ALT: 17 U/L (ref 14–54)
AST: 18 U/L (ref 15–41)
Anion gap: 8 (ref 5–15)
BILIRUBIN TOTAL: 0.7 mg/dL (ref 0.3–1.2)
BUN: 15 mg/dL (ref 6–20)
CO2: 27 mmol/L (ref 22–32)
CREATININE: 0.79 mg/dL (ref 0.44–1.00)
Calcium: 8.9 mg/dL (ref 8.9–10.3)
Chloride: 104 mmol/L (ref 101–111)
GFR calc Af Amer: >60 mL/min (ref >60)
GLUCOSE: 315 mg/dL — AB (ref 65–99)
POTASSIUM: 4 mmol/L (ref 3.5–5.1)
Sodium: 139 mmol/L (ref 135–145)
TOTAL PROTEIN: 7.6 g/dL (ref 6.5–8.1)

## 2016-02-02 LAB — POC OCCULT BLOOD, ED: Fecal Occult Bld: NEGATIVE

## 2016-02-02 LAB — TYPE AND SCREEN
ABO/RH(D): O POS
ANTIBODY SCREEN: NEGATIVE

## 2016-02-02 LAB — CBC
HEMATOCRIT: 39.9 % (ref 36.0–46.0)
Hemoglobin: 12.9 g/dL (ref 12.0–15.0)
MCH: 28.4 pg (ref 26.0–34.0)
MCHC: 32.3 g/dL (ref 30.0–36.0)
MCV: 87.9 fL (ref 78.0–100.0)
PLATELETS: 239 10*3/uL (ref 150–400)
RBC: 4.54 MIL/uL (ref 3.87–5.11)
RDW: 14 % (ref 11.5–15.5)
WBC: 10 10*3/uL (ref 4.0–10.5)

## 2016-02-02 LAB — BRAIN NATRIURETIC PEPTIDE: B NATRIURETIC PEPTIDE 5: 164.2 pg/mL — AB (ref 0.0–100.0)

## 2016-02-02 LAB — I-STAT TROPONIN, ED: Troponin i, poc: 0.06 ng/mL (ref 0.00–0.08)

## 2016-02-02 LAB — URINE MICROSCOPIC-ADD ON

## 2016-02-02 MED ORDER — SODIUM CHLORIDE 0.9 % IV SOLN
INTRAVENOUS | Status: DC
  Administered 2016-02-02: 19:00:00 via INTRAVENOUS

## 2016-02-02 MED ORDER — IOPAMIDOL (ISOVUE-300) INJECTION 61%
100.0000 mL | Freq: Once | INTRAVENOUS | Status: AC | PRN
  Administered 2016-02-02: 100 mL via INTRAVENOUS

## 2016-02-02 NOTE — ED Notes (Signed)
Pt to CT

## 2016-02-02 NOTE — ED Provider Notes (Signed)
CSN: [id number]     Arrival date & time 02/02/16  1359 History   First MD Initiated Contact with Patient 02/02/16 1620     Chief Complaint  Patient presents with  . Abdominal Pain  . GI Bleeding   HPI Comments: 82 year old female presents with abdominal pain after a fall 2 weeks ago. PMH significant for CHF, diabetes, TIA, dizziness, multiple falls, diverticulosis. She fell on 4/15 on a flight a 4 stairs and hit her lower abdomen on a railing. The abdominal pain is mostly in the RLQ. She states there is a "knot" in her belly which is very tender. She reports associated weakness and dizziness (which is chronic but has gotten worse), nausea, and 1 episode of BRBPR which started yesterday. She reports she has broken her back from the fall, an MRI was done recently and she has an appt with Ortho. She is ambulatory with a cane. Past surgical hx significant for hysterectomy, appendectomy, multiple back surgeries. Denies fever, chills, chest pain, shortness of breath, nausea, vomiting, diarrhea, constipation, dysuria.  Patient is a 82 y.o. female presenting with abdominal pain.  Abdominal Pain Associated symptoms: fatigue   Associated symptoms: no chest pain, no chills, no constipation, no diarrhea, no dysuria, no fever, no hematuria, no nausea, no shortness of breath and no vomiting     Past Medical History  Diagnosis Date  . ANEMIA 01/30/2008  . ARTHRITIS 12/11/2007  . BACK PAIN, CHRONIC 06/23/2008  . BREAST CYST, RIGHT 12/17/2007  . CONTACT DERMATITIS 03/10/2009  . CORONARY ARTERY DISEASE 03/01/2007    had a normal Myoview stress test 07-13-11  . CYSTOCELE WITHOUT MENTION UTERINE PROLAPSE LAT 09/12/2010  . DEGENERATIVE JOINT DISEASE 08/07/2007  . DEPRESSION 03/01/2007  . Esophageal reflux 11/06/2007  . HYPERKERATOSIS 06/02/2009  . HYPERLIPIDEMIA 08/26/2008  . HYPERTENSION 03/01/2007  . Irritable bowel syndrome 01/26/2009  . LABYRINTHITIS 11/03/2009  . Narcolepsy without cataplexy 08/26/2008  . OBESITY  03/01/2007  . PERIPHERAL VASCULAR DISEASE 01/26/2009  . SYNCOPE 08/26/2008    had brain MRI on 06-28-12 showing only chronic microvascular ischemia and atrophy   . TRANSIENT ISCHEMIC ATTACK 10/20/2009    had normal brain MRA with patent vertebrals and carotids 06-28-12  . NEPHROLITHIASIS 04/29/2009  . COPD (chronic obstructive pulmonary disease) ([place])   . Colon polyps     FRAGMENTS OF HYPERPLASTIC POLYP  . Diverticulosis of colon (without mention of hemorrhage)   . DIABETES MELLITUS, TYPE II 08/26/2008    sees Dr. Dwyane Dee    Past Surgical History  Procedure Laterality Date  . Cataract extraction    . Abdominal hysterectomy    . Back surgery      x2  . Tonsilectomy, adenoidectomy, bilateral myringotomy and tubes    . Spine surgery      x 3  . Cardiac catheterization  07/2009  . Intraocular lens insertion    . Esophagogastroduodenoscopy  11-13-07    per Dr. Deatra Ina, normal   . Colonoscopy  11-13-07    per Dr. Deatra Ina, benign polyps, repeat in 5 yrs   Family History  Problem Relation Age of Onset  . Lupus Mother   . COPD Father    Social History  Substance Use Topics  . Smoking status: Never Smoker   . Smokeless tobacco: Never Used  . Alcohol Use: No   OB History    No data available     Review of Systems  Constitutional: Positive for fatigue. Negative for fever and chills.  HENT: Negative for congestion.  Respiratory: Negative for shortness of breath.   Cardiovascular: Negative for chest pain.  Gastrointestinal: Positive for abdominal pain and anal bleeding. Negative for nausea, vomiting, diarrhea, constipation, blood in stool, abdominal distention and rectal pain.  Genitourinary: Negative for dysuria, hematuria, flank pain and difficulty urinating.  Musculoskeletal: Positive for back pain, arthralgias and gait problem.  Neurological: Positive for dizziness and weakness. Negative for syncope.      Allergies  Aspirin; Ciprofloxacin; Clarithromycin; Doxycycline;  Erythromycin; Lyrica; Macrobid; Metolazone; Other; Penicillins; and Sulfonamide derivatives  Home Medications   Prior to Admission medications   Medication Sig Start Date End Date Taking? Authorizing Provider  albuterol (PROVENTIL HFA;VENTOLIN HFA) 108 (90 BASE) MCG/ACT inhaler Inhale 2 puffs into the lungs every 4 (four) hours as needed for wheezing or shortness of breath. 04/01/14   Dorothy Spark, MD  buPROPion (WELLBUTRIN XL) 150 MG 24 hr tablet Take 1 tablet (150 mg total) by mouth daily. 08/24/15   Laurey Morale, MD  cyclobenzaprine (FLEXERIL) 10 MG tablet Take 1 tablet (10 mg total) by mouth at bedtime. 06/14/15   Cameron Sprang, MD  diclofenac sodium (VOLTAREN) 1 % GEL Apply 4 g topically 4 (four) times daily as needed (for arthritis pain in shoulders and fingers). For joint pain. 04/07/13   Laurey Morale, MD  fluticasone (FLONASE) 50 MCG/ACT nasal spray Place 2 sprays into both nostrils daily. 12/30/14   Kennyth Arnold, FNP  Fluticasone-Salmeterol (ADVAIR DISKUS) 250-50 MCG/DOSE AEPB Inhale 1 puff into the lungs 2 (two) times daily. 04/01/14   Dorothy Spark, MD  furosemide (LASIX) 40 MG tablet Take 1 tablet (40 mg total) by mouth 2 (two) times daily. TAKE 1 IN THE MORNING AND ONE IN THE EVENING 11/29/15   Laurey Morale, MD  gabapentin (NEURONTIN) 300 MG capsule Take 1 capsule (300 mg total) by mouth 3 (three) times daily. 05/20/15   Laurey Morale, MD  HYDROcodone-homatropine (HYDROMET) 5-1.5 MG/5ML syrup Take 5 mLs by mouth every 4 (four) hours as needed. Patient not taking: Reported on 01/21/2016 12/24/15   Laurey Morale, MD  Insulin Aspart Prot & Aspart (NOVOLOG 70/30 MIX) (70-30) 100 UNIT/ML Pen Take 25 units with breakfast, 15 units with lunch and 25 units with dinner 06/03/14   Laurey Morale, MD  Insulin Glargine (LANTUS SOLOSTAR) 100 UNIT/ML Solostar Pen INJECT 100 UNITS UNDER THE SKIN AT BEDTIME and inject 50 units in the morning 11/17/14   Laurey Morale, MD  ketoconazole (NIZORAL) 2 %  cream Apply 1 application topically 2 (two) times daily as needed for irritation. 06/26/14   Eulas Post, MD  LANTUS SOLOSTAR 100 UNIT/ML Solostar Pen INJECT 100 UNITS UNDER THE SKIN AT BEDTIME AND 50 UNITS IN THE MORNING 12/23/15   Laurey Morale, MD  meclizine (ANTIVERT) 25 MG tablet Take 1 tablet (25 mg total) by mouth every 4 (four) hours as needed for dizziness. 05/14/14   Laurey Morale, MD  metolazone (ZAROXOLYN) 2.5 MG tablet Take 1 tablet (2.5 mg total) by mouth daily. TAKE THIS 30 MINUTES PRIOR TO YOUR MORNING DOSE OF LASIX 11/03/14   Dorothy Spark, MD  nitroGLYCERIN (NITROSTAT) 0.4 MG SL tablet Place 1 tablet (0.4 mg total) under the tongue every 5 (five) minutes as needed for chest pain. Patient not taking: Reported on 02/02/2016 01/21/16   Laurey Morale, MD  ondansetron (ZOFRAN) 8 MG tablet Take 1 tablet (8 mg total) by mouth every 8 (eight) hours as needed for  nausea or vomiting. 05/20/15   Laurey Morale, MD  oxyCODONE-acetaminophen (PERCOCET) 10-325 MG tablet Take 1 tablet by mouth every 6 (six) hours as needed for pain. 01/21/16   Laurey Morale, MD  phenazopyridine (PYRIDIUM) 200 MG tablet Take 1 tablet (200 mg total) by mouth 3 (three) times daily as needed for pain. Reported on 01/21/2016 01/21/16   Laurey Morale, MD  pramipexole (MIRAPEX) 1 MG tablet Take 6 tablets (6 mg total) by mouth daily. 11/29/15   Laurey Morale, MD  zolpidem (AMBIEN) 10 MG tablet Take 1 tablet (10 mg total) by mouth at bedtime. 11/29/15   Laurey Morale, MD   BP 166/72 mmHg  Pulse 88  Temp(Src) 98.3 F (36.8 C) (Oral)  Resp 18  SpO2 95%   Physical Exam  Constitutional: She is oriented to person, place, and time. She appears well-developed and well-nourished.  Elderly female in NAD  HENT:  Head: Normocephalic and atraumatic.  Right Ear: Hearing, tympanic membrane, external ear and ear canal normal.  Left Ear: Hearing, tympanic membrane, external ear and ear canal normal.  Eyes: Conjunctivae are normal.  Pupils are equal, round, and reactive to light. Right eye exhibits no discharge. Left eye exhibits no discharge. No scleral icterus.  Neck: Normal range of motion. No JVD present.  Cardiovascular: Normal rate and regular rhythm.  Exam reveals no gallop and no friction rub.   No murmur heard. Pulmonary/Chest: Effort normal and breath sounds normal. No respiratory distress. She has no wheezes. She has no rales. She exhibits no tenderness.  Abdominal: Soft. Bowel sounds are normal. She exhibits no distension and no mass. There is tenderness. There is no rebound and no guarding.  RLQ ecchymosis. Diffusely tender. She is most tender over the RLQ with a palpable area of induration.   Genitourinary:  Rectal exam: Negative without mass, lesions or tenderness. No frank blood noted. Chaperone present   Neurological: She is alert and oriented to person, place, and time.  Mental Status:  Alert, oriented, thought content appropriate, able to give a coherent history. Speech fluent without evidence of aphasia. Able to follow 2 step commands without difficulty.  Cranial Nerves:  II:  Peripheral visual fields grossly normal, pupils equal, round, reactive to light III,IV, VI: ptosis not present, extra-ocular motions intact bilaterally  V,VII: smile symmetric, facial light touch sensation equal VIII: hearing grossly normal to voice  X: uvula elevates symmetrically  XI: bilateral shoulder shrug symmetric and strong XII: midline tongue extension without fassiculations Motor:  Normal tone. 4/5 in upper and lower extremities bilaterally including strong and equal grip strength and dorsiflexion/plantar flexion Sensory: Pinprick and light touch normal in all extremities.  Deep Tendon Reflexes: 2+ and symmetric in the biceps and patella Cerebellar: normal finger-to-nose with bilateral upper extremities. No dysmetria Gait: Slow, shuffling gait. Uses cane. Has to hold on to side rails in the hallway. No ataxia. CV:  distal pulses palpable throughout     Skin: Skin is warm and dry.  Tenting of skin  Psychiatric: She has a normal mood and affect.    ED Course  Procedures (including critical care time) Labs Review Labs Reviewed  COMPREHENSIVE METABOLIC PANEL - Abnormal; Notable for the following:    Glucose, Bld 315 (*)    All other components within normal limits  URINALYSIS, ROUTINE W REFLEX MICROSCOPIC (NOT AT [facility]) - Abnormal; Notable for the following:    Specific Gravity, Urine 1.036 (*)    Glucose, UA >1000 (*)  All other components within normal limits  BRAIN NATRIURETIC PEPTIDE - Abnormal; Notable for the following:    B Natriuretic Peptide 164.2 (*)    All other components within normal limits  URINE MICROSCOPIC-ADD ON - Abnormal; Notable for the following:    Squamous Epithelial / LPF 0-5 (*)    Bacteria, UA RARE (*)    All other components within normal limits  CBC  POC OCCULT BLOOD, ED  I-STAT TROPOININ, ED  TYPE AND SCREEN    Imaging Review Ct Abdomen Pelvis W Contrast  02/02/2016  CLINICAL DATA:  82 year old with right-sided abdominal pain which the patient relates to a fall down stairs at her home on Easter Sunday. Episode of hematochezia yesterday. EXAM: CT ABDOMEN AND PELVIS WITH CONTRAST TECHNIQUE: Multidetector CT imaging of the abdomen and pelvis was performed using the standard protocol following bolus administration of intravenous contrast. CONTRAST:  169mL ISOVUE-300 IOPAMIDOL 61% IV. Oral contrast was not administered at the request of the ordering physician. COMPARISON:  02/23/2014, 05/12/2013, 05/04/2009. FINDINGS: Lower chest: Scarring involving the visualized lower lobes lung bases otherwise clear. Heart size normal with dense mitral annular calcification. Hepatobiliary: Mild diffuse hepatic steatosis without focal hepatic parenchymal abnormality. Gallbladder normal in appearance without calcified gallstones. No biliary ductal dilation. Pancreas: Mild to moderate  atrophy without focal parenchymal abnormality. No peripancreatic inflammation. Spleen: Scattered calcified granulomata throughout the otherwise normal-appearing spleen. Adrenals/Urinary Tract: Normal appearing adrenal glands. Kidneys normal in size and appearance without focal parenchymal abnormality. No evidence of urinary tract calculi or obstruction. Normal-appearing urinary bladder. Stomach/Bowel: Stomach normal in appearance for the degree of distention. Normal-appearing small bowel. Scattered diverticula throughout the colon without evidence of acute diverticulitis. Entire colon relatively decompressed without other focal abnormalities. Appendix not visualized but no pericecal inflammation. Vascular/Lymphatic: Moderate to severe aortoiliofemoral atherosclerosis without aneurysm. Visceral arteries atherosclerotic though patent. No pathologic lymphadenopathy. Reproductive: Surgically absent uterus.  No adnexal masses. Other: Ecchymosis/edema involving the subcutaneous fat of the right and left lateral anterior abdominal wall, with a possible small hematoma involving the right lateral anterior abdominal wall. Musculoskeletal: Generalized osseous demineralization. Facet degenerative changes throughout the lumbar spine. Degenerative disc disease at L4-5 and L5-S1. Lower thoracic spondylosis. Degenerative changes involving the sacroiliac joints with vacuum phenomenon. No acute osseous abnormality. IMPRESSION: 1. No acute abnormalities involving the abdomen or pelvis. 2. Edema/ecchymosis involving the subcutaneous fat of the right and left lateral anterior abdominal wall, with a possible small hematoma on the right. Please correlate with physical examination. 3. Mild diffuse hepatic steatosis without focal hepatic parenchymal abnormality. 4. Mild pancreatic atrophy. Electronically Signed   By: Evangeline Dakin M.D.   On: 02/02/2016 19:10   I have personally reviewed and evaluated these images and lab results as  part of my medical decision-making.   EKG Interpretation None      MDM   Final diagnoses:  Right lower quadrant abdominal pain  Weakness   82 year old female who presents with ongoing abdominal pain after a fall 2 weeks ago and hematochezia with weakness/dizziness. She saw her PCP earlier today who was concerned for diverticulitis and who sent her here for further evaluation.   Her vitals show she is hypertensive, but otherwise stable and she is afebrile. Her exam revealed a diffusely tender abdomen with most of her tenderness over the RLQ. No focal deficits on neuro exam. Labs were unremarkable, no anemia or leukocytosis. UA showed high specific gravity of 1.036 with >1000 glucose so IVF was started. CT of abdomen showed  no acute intraabdominal pathology. Results discussed with patient and son. Results from CT scan included in discharge papers.   Shared visit with Dr. Gilford Raid. Patient is NAD, non-toxic, with stable VS. Patient is informed of clinical course, understands medical decision making process, and agrees with plan. Opportunity for questions provided and all questions answered. Return precautions given.    Recardo Evangelist, PA-C 02/02/16 2018

## 2016-02-02 NOTE — Progress Notes (Signed)
Pre visit review using our clinic review tool, if applicable. No additional management support is needed unless otherwise documented below in the visit note. Pt unable to stand and weigh.   

## 2016-02-02 NOTE — ED Notes (Signed)
Pt complains of right sided abdominal since Monday night. Pt states she noticed she was passing blood after a bowel movement yesterday. Pt states she fell down the stairs on Easter Sunday and feels the abdominal pain is related to her fall.

## 2016-02-02 NOTE — Progress Notes (Signed)
   Subjective:    Patient ID: Melanie Mcdonald, female    DOB: July 11, 1933, 82 y.o.   MRN: [id number]  HPI Here for 2 days of bilateral lower abdominal pain and then one episode last night of a moderate amount of bright red blood passed per rectum. No fever. She is nauseated but has not vomited. No urinary symptoms. She has known diverticulosis of the colon. She did fall at home on 01-15-16 and struck her RLQ on the end of a stair railing. We have seen her several times for this and she has been quite sore in this area.    Review of Systems  Constitutional: Positive for fatigue. Negative for fever.  Respiratory: Negative.   Cardiovascular: Negative.   Gastrointestinal: Positive for nausea, abdominal pain and anal bleeding. Negative for vomiting, diarrhea, constipation, abdominal distention and rectal pain.  Genitourinary: Negative.   Neurological: Positive for dizziness, weakness and light-headedness. Negative for headaches.       Objective:   Physical Exam  Constitutional: She is oriented to person, place, and time.  Alert but frail and quite weak   Neck: No thyromegaly present.  Cardiovascular: Normal rate, regular rhythm, normal heart sounds and intact distal pulses.   Pulmonary/Chest: Effort normal and breath sounds normal. No respiratory distress. She has no wheezes. She has no rales.  Abdominal: Soft. Bowel sounds are normal. She exhibits no distension.  Resolving ecchymoses over the RLQ with some mild tenderness. She is more tender over the LLQ and left flank with positive rebound.   Lymphadenopathy:    She has no cervical adenopathy.  Neurological: She is alert and oriented to person, place, and time.          Assessment & Plan:  Abdominal pain with rectal bleeding, most consistent with acute diverticulitis. She is recovering from a blunt injury to the abdomen as well, but I do not think these are related. We will have her son [name] drive her directly to [facility] ER for  further evaluation and treatment.  Laurey Morale, MD

## 2016-02-02 NOTE — Discharge Instructions (Signed)

## 2016-02-08 ENCOUNTER — Ambulatory Visit (INDEPENDENT_AMBULATORY_CARE_PROVIDER_SITE_OTHER): Payer: Medicare Other | Admitting: Gastroenterology

## 2016-02-08 ENCOUNTER — Encounter: Payer: Self-pay | Admitting: Gastroenterology

## 2016-02-08 VITALS — BP 152/88 | HR 96 | Ht 67.5 in | Wt 242.8 lb

## 2016-02-08 DIAGNOSIS — S301XXA Contusion of abdominal wall, initial encounter: Secondary | ICD-10-CM | POA: Diagnosis not present

## 2016-02-08 DIAGNOSIS — K625 Hemorrhage of anus and rectum: Secondary | ICD-10-CM

## 2016-02-08 NOTE — Progress Notes (Signed)
[facility] Progress Note  Chief Complaint: Abdominal pain and rectal bleeding.  Subjective History:  This is the first time I have seen Melanie Mcdonald in the clinic. Her last office visit was in June 2015, where she saw our PA for many years of chronic lower abdominal pain was felt likely due to adhesions from prior surgery. She is somewhat tangential historian, but describes a recent fall into a railing, which caused trauma to the abdominal wall. She had a large hematoma, was also having some back pain. She saw her PCP for this last week, and also because she had a single episode of rectal bleeding in the midst of this. I reviewed notes in labs from the ED visit on 02/02/2016, her CBC was normal, CT abdomen and pelvis was normal. She indicates that MRI was done that showed that she "broke her back". She is still having some abdominal wall pain.  ROS: Cardiovascular:  no chest pain Respiratory: no dyspnea  The patient's Past Medical, Family and Social History were reviewed and are on file in the EMR.  Objective:  Med list reviewed  Vital signs in last 24 hrs: Filed Vitals:   02/08/16 1139  BP: 152/88  Pulse: 96    Physical Exam   HEENT: sclera anicteric, oral mucosa moist without lesions  Neck: supple, no thyromegaly, JVD or lymphadenopathy  Cardiac: RRR without murmurs, S1S2 heard, no peripheral edema  Pulm: clear to auscultation bilaterally, normal RR and effort noted  Abdomen: soft, Mild tenderness diffusely to the abdominal wall musculature., with active bowel sounds. No guarding or palpable hepatosplenomegaly. She has resolving bruises on the lower abdominal wall.  Skin; warm and dry, no jaundice or rash  Recent Labs:  Lab Results  Component Value Date   WBC 10.0 02/02/2016   HGB 12.9 02/02/2016   HCT 39.9 02/02/2016   MCV 87.9 02/02/2016   PLT 239 02/02/2016     Radiologic studies: CT scan abdomen and pelvis as noted  above   @ASSESSMENTPLANBEGIN @ Assessment: Encounter Diagnoses  Name Primary?  . Abdominal wall contusion, initial encounter Yes  . Rectal bleeding     This appears to be musculoskeletal abdominal wall pain due to this recent trauma. She had a single episode of rectal bleeding and has a normal hemoglobin. Given her age and comorbidities, I do not feel it is wise to pursue a colonoscopy at this point.   Plan:  Expectant management, and see me as needed.  Melanie Mcdonald

## 2016-02-08 NOTE — Patient Instructions (Signed)
Follow up as needed.  Thank you for choosing [facility]  Dr Henry Danis III  

## 2016-02-09 ENCOUNTER — Other Ambulatory Visit: Payer: Medicare Other

## 2016-02-15 ENCOUNTER — Ambulatory Visit (INDEPENDENT_AMBULATORY_CARE_PROVIDER_SITE_OTHER): Payer: Medicare Other | Admitting: Family Medicine

## 2016-02-15 ENCOUNTER — Encounter: Payer: Self-pay | Admitting: Family Medicine

## 2016-02-15 VITALS — BP 162/94 | HR 87 | Temp 98.5°F

## 2016-02-15 DIAGNOSIS — R197 Diarrhea, unspecified: Secondary | ICD-10-CM | POA: Diagnosis not present

## 2016-02-15 DIAGNOSIS — F4321 Adjustment disorder with depressed mood: Secondary | ICD-10-CM | POA: Diagnosis not present

## 2016-02-15 DIAGNOSIS — K580 Irritable bowel syndrome with diarrhea: Secondary | ICD-10-CM

## 2016-02-15 MED ORDER — LORAZEPAM 1 MG PO TABS: 1.0000 mg | ORAL_TABLET | Freq: Four times a day (QID) | ORAL | Status: DC | PRN

## 2016-02-15 MED ORDER — DIPHENOXYLATE-ATROPINE 2.5-0.025 MG PO TABS: 2.0000 | ORAL_TABLET | Freq: Four times a day (QID) | ORAL | Status: DC | PRN

## 2016-02-15 MED ORDER — INSULIN GLARGINE 100 UNIT/ML SOLOSTAR PEN: PEN_INJECTOR | SUBCUTANEOUS | Status: DC

## 2016-02-15 NOTE — Progress Notes (Signed)
   Subjective:    Patient ID: Melanie Mcdonald, female    DOB: 06-15-1933, 82 y.o.   MRN: [id number]  HPI Here for help with problems that arose after her son's death about 10 days ago. He died unexpectedly at the age of 35, and she has been quite upset ever since. She has trouble sleeping and her appetite is poor. She enjoyed seeing some family members at the funeral that she had not seen in years. Her nerves have also flared up her IBS and she has had a lot of diarrhea in the past week. Imodium AD is not helping. No nausea or vomiting, no fever.    Review of Systems  Constitutional: Negative.   Respiratory: Negative.   Cardiovascular: Negative.   Gastrointestinal: Positive for diarrhea. Negative for nausea, vomiting, abdominal pain, constipation, blood in stool, abdominal distention, anal bleeding and rectal pain.  Neurological: Negative.   Psychiatric/Behavioral: Positive for sleep disturbance and dysphoric mood. The patient is nervous/anxious.        Objective:   Physical Exam  Constitutional: She is oriented to person, place, and time.  Walks slowly with her walker   Cardiovascular: Normal rate, regular rhythm, normal heart sounds and intact distal pulses.   Pulmonary/Chest: Effort normal and breath sounds normal. No respiratory distress. She has no wheezes. She has no rales.  Abdominal: Soft. Bowel sounds are normal. She exhibits no distension and no mass. There is no tenderness. There is no rebound and no guarding.  Neurological: She is alert and oriented to person, place, and time.  Psychiatric: Her behavior is normal. Thought content normal.  Tearful           Assessment & Plan:  Her diarrhea is probably the result of IBS, which has been exacerbated by her recent stress. Try Lomotil prn. Try Ativan for the anxiety.  Laurey Morale, MD

## 2016-02-15 NOTE — Progress Notes (Signed)
Pre visit review using our clinic review tool, if applicable. No additional management support is needed unless otherwise documented below in the visit note. Pt unable to stand and weigh.   

## 2016-02-16 ENCOUNTER — Other Ambulatory Visit: Payer: Medicare Other

## 2016-02-17 ENCOUNTER — Telehealth: Payer: Self-pay | Admitting: Family Medicine

## 2016-02-17 NOTE — Telephone Encounter (Signed)
I left a voice message with below information. 

## 2016-02-17 NOTE — Telephone Encounter (Signed)
Medication reaction.

## 2016-02-17 NOTE — Telephone Encounter (Signed)
Melanie Mcdonald - Client [facility] Patient Name: Melanie Mcdonald Gender: Female DOB: 10/02/1949 Age: 82 Y 74 M 17 D Return Phone Number: [phone] (Primary) Address: City/State/Zip: [place] [address] Client [facility] Mcdonald - Client Client Site [place] - Mcdonald Physician Shanon Ace - MD Contact Type Call Who Is Calling Patient / Member / Family / Caregiver Call Type Triage / Clinical Caller Name Melanie Mcdonald Relationship To Patient Self Return Phone Number [phone] (Primary) Chief Complaint Cold Symptom Reason for Call Medication Question Initial Comment Caller states she has a chest cold and bought Mucinex D and is supposed to ask doctor because she is hypothyroid and on medication for it. Appointment Disposition EMR Appointment Not Necessary Info pasted into Epic No Translation No No Triage Reason Patient declined Nurse Assessment Nurse: Denyse Amass, RN, Benjamine Mola Date/Time (Eastern Time): 02/16/2016 4:21:47 PM Confirm and document reason for call. If symptomatic, describe symptoms. You must click the next button to save text entered. ---Patient states she had had a cold for 4 days. She bought some Mucinex and wants to know if she can take it with Hypothyroidism Has the patient traveled out of the country within the last 30 days? ---Not Applicable Does the patient have any new or worsening symptoms? ---Yes Will a triage be completed? ---No Select reason for no triage. ---Patient declined Please document clinical information provided and list any resource used. ---[email or web address] Drug interaction results for the following 2 drugs: levothyroxine Mucinex D (guaifenesin/pseudoephedrine Interactions between your selected drugs Moderate levothyroxine # pseudoephedrine Applies [id number] and Mucinex D (guaifenesin/pseudoephedrine) Talk to your doctor before  using levothyroxine together with pseudoephedrine. Combining these medications may increase the risk of cardiovascular side effects such as high blood pressure, palpitation, chest pain, and irregular heart beat. If you have preexisting heart disease, your condition may worsen. You may need a dose adjustment or more frequent monitoring by your doctor to safely use both medications. It is important to tell your doctor about all other medications you use, including vitamins and herbs. Do not stop using any medications without first talking to your doctor. PLEASE NOTE: All timestamps contained within this report are represented as [place] Standard Time. CONFIDENTIALTY NOTICE: This fax transmission is intended only for the addressee. It contains information that is legally privileged, confidential or otherwise protected from use or disclosure. If you are not the intended recipient, you are strictly prohibited from reviewing, disclosing, copying using or disseminating any of this information or taking any action in reliance on or regarding this information. If you have received this fax in error, please notify us immediately by telephone so that we can arrange for its return to [place]. Phone: [phone], Toll-Free: [phone], Fax: [phone] Page: 2 of 2 Call Id: [id number] Guidelines Guideline Title Affirmed Question Affirmed Notes Nurse Date/Time Eilene Ghazi Time) Disp. Time Eilene Ghazi Time) Disposition Final User 02/16/2016 4:28:20 PM Clinical Call Yes [place], RN, Benjamine Mola

## 2016-02-17 NOTE — Telephone Encounter (Signed)
It is okay for her to take Mucinex D

## 2016-02-22 ENCOUNTER — Telehealth: Payer: Self-pay | Admitting: Family Medicine

## 2016-02-22 NOTE — Telephone Encounter (Signed)
Pt has appt on 02/23/16

## 2016-02-22 NOTE — Telephone Encounter (Signed)
[facility] Patient Name: Melanie Mcdonald DOB: July 08, 1933 Initial Comment Caller states she fell a five weeks ago, broke back and bruised abdomen. Now when she gives herself her insulin, a bruise comes up size between a nickel and a quarter. Nurse Assessment Nurse: Dimas Chyle, RN, Dellis Filbert Date/Time Eilene Ghazi Time): 02/22/2016 1:03:44 PM Confirm and document reason for call. If symptomatic, describe symptoms. You must click the next button to save text entered. ---Caller states she fell a five weeks ago, broke back and bruised abdomen. Now when she gives herself her insulin, a bruise comes up size between a nickel and a quarter. Symptoms started within the last 3 weeks. Has the patient traveled out of the country within the last 30 days? ---No Does the patient have any new or worsening symptoms? ---Yes Will a triage be completed? ---Yes Related visit to physician within the last 2 weeks? ---No Does the PT have any chronic conditions? (i.e. diabetes, asthma, etc.) ---Yes List chronic conditions. ---Diabetes type 2, HTN Is this a behavioral health or substance abuse call? ---No Guidelines Guideline Title Affirmed Question Affirmed Notes Bruises [1] Not caused by an injury AND [2] < 5 unexplained bruises Final Disposition User See PCP When Office is Open (within 3 days) Dimas Chyle, RN, [employer] Referrals REFERRED TO PCP OFFICE Disagree/Comply: Leta Baptist

## 2016-02-23 ENCOUNTER — Ambulatory Visit (INDEPENDENT_AMBULATORY_CARE_PROVIDER_SITE_OTHER): Payer: Medicare Other | Admitting: Family Medicine

## 2016-02-23 ENCOUNTER — Encounter: Payer: Self-pay | Admitting: Family Medicine

## 2016-02-23 VITALS — BP 173/87 | HR 99 | Temp 98.0°F

## 2016-02-23 DIAGNOSIS — F4321 Adjustment disorder with depressed mood: Secondary | ICD-10-CM

## 2016-02-23 DIAGNOSIS — R1031 Right lower quadrant pain: Secondary | ICD-10-CM

## 2016-02-23 DIAGNOSIS — R233 Spontaneous ecchymoses: Secondary | ICD-10-CM

## 2016-02-23 DIAGNOSIS — R238 Other skin changes: Secondary | ICD-10-CM

## 2016-02-23 DIAGNOSIS — G8929 Other chronic pain: Secondary | ICD-10-CM | POA: Diagnosis not present

## 2016-02-23 NOTE — Progress Notes (Signed)
   Subjective:    Patient ID: Melanie Mcdonald, female    DOB: October 02, 1933, 82 y.o.   MRN: [id number]  HPI Here to follow up on easy bruising, which has been more noticeable in the past month.  This is most apparent on the abdomen at the sites of her insulin injections. The bruises are small and they resolve within a week. No other bleeding problems. Of note she had had labs recently showing normal platelet counts, liver enzymes, etc. She does not take aspirin and seldom takes NSAIDs. She still complains of pain in the right lower abdomen at the site of a hematoma, which resulted from a fall a few weeks ago. The swelling is getting much smaller. BMs are normal. We also discussed how she is getting over the recent death of her son. We talked about this at our last visit. She still has crying spells and has trouble sleeping, but overall she is adjusting.    Review of Systems  Constitutional: Negative.   Respiratory: Negative.   Cardiovascular: Negative.   Gastrointestinal: Positive for abdominal pain. Negative for nausea, vomiting, diarrhea, constipation, blood in stool, abdominal distention, anal bleeding and rectal pain.  Genitourinary: Negative.   Neurological: Negative.   Hematological: Bruises/bleeds easily.  Psychiatric/Behavioral: Positive for sleep disturbance, dysphoric mood and decreased concentration. Negative for hallucinations, behavioral problems, confusion and agitation. The patient is nervous/anxious.        Objective:   Physical Exam  Constitutional: She is oriented to person, place, and time. She appears well-developed and well-nourished.  Cardiovascular: Normal rate, regular rhythm, normal heart sounds and intact distal pulses.   Pulmonary/Chest: Effort normal and breath sounds normal.  Abdominal: Soft. Bowel sounds are normal. She exhibits no distension and no mass. There is no rebound and no guarding.  She is still tender in the RLQ, but the hematoma is much smaller than  before.   Neurological: She is alert and oriented to person, place, and time.  Psychiatric: Her behavior is normal. Thought content normal.  Tearful           Assessment & Plan:  She is still working through the grief process but she seems to be doing well. Her chronic abdominal pain is still difficult to explain. Her abdominal and pelvic CT scan was clear.  I suppose she could have an inflamed nerve in the abdominal wall from her fall. We discussed the possibility of trying a new pain medication but we agreed not to do this now.  We will  continue to monitor this. As for the bruising I think this is a benign process and we will observe this.  Laurey Morale, MD

## 2016-02-23 NOTE — Progress Notes (Signed)
Pre visit review using our clinic review tool, if applicable. No additional management support is needed unless otherwise documented below in the visit note. Pt unable to stand and weigh today 

## 2016-03-07 ENCOUNTER — Telehealth: Payer: Self-pay | Admitting: Family Medicine

## 2016-03-07 NOTE — Telephone Encounter (Signed)
Pt call to say she has a bladder infection and is in a lot of pain and is asking if something can be called in     [place]

## 2016-03-07 NOTE — Telephone Encounter (Signed)
Tried to call pt to find out what her symptoms are, phone rang one time and was disconnected.  Need to know symptoms before sending to Dr Sarajane Jews

## 2016-03-10 ENCOUNTER — Ambulatory Visit (INDEPENDENT_AMBULATORY_CARE_PROVIDER_SITE_OTHER): Payer: Medicare Other | Admitting: Family Medicine

## 2016-03-10 ENCOUNTER — Encounter: Payer: Self-pay | Admitting: Family Medicine

## 2016-03-10 VITALS — BP 152/90 | HR 89 | Temp 98.0°F | Ht 67.5 in

## 2016-03-10 DIAGNOSIS — N390 Urinary tract infection, site not specified: Secondary | ICD-10-CM

## 2016-03-10 DIAGNOSIS — M546 Pain in thoracic spine: Secondary | ICD-10-CM | POA: Diagnosis not present

## 2016-03-10 LAB — POCT URINALYSIS DIPSTICK
Glucose, UA: NEGATIVE
KETONES UA: NEGATIVE
Nitrite, UA: POSITIVE
PH UA: 6
PROTEIN UA: 15
RBC UA: NEGATIVE
UROBILINOGEN UA: 4

## 2016-03-10 MED ORDER — CEPHALEXIN 500 MG PO CAPS: 500.0000 mg | ORAL_CAPSULE | Freq: Three times a day (TID) | ORAL | Status: AC

## 2016-03-10 NOTE — Progress Notes (Signed)
Pre visit review using our clinic review tool, if applicable. No additional management support is needed unless otherwise documented below in the visit note. Patient declines weight measurement today. 

## 2016-03-10 NOTE — Telephone Encounter (Signed)
Patient was seen today by Dr Sarajane Jews.

## 2016-03-10 NOTE — Progress Notes (Signed)
   Subjective:    Patient ID: Melanie Mcdonald, female    DOB: 1932-12-05, 82 y.o.   MRN: [id number]  HPI Here for left sided back pain that started 4 weeks ago after she fell on some steps. The pain has become worse in the past few days. It is sharp and can be severe. She is taking Oxycodone for it with mixed relief. She asks to check a urine sample because her urine has had a strong odor. No urgency or burning. BMs are normal.    Review of Systems  Constitutional: Negative.   Respiratory: Negative.   Cardiovascular: Negative.   Genitourinary: Negative.   Musculoskeletal: Positive for back pain.       Objective:   Physical Exam  Constitutional: She is oriented to person, place, and time. She appears well-developed and well-nourished.  Cardiovascular: Normal rate, regular rhythm, normal heart sounds and intact distal pulses.   Pulmonary/Chest: Effort normal and breath sounds normal.  Musculoskeletal:  Tender in the lower thoracic area just to the left of the spine. Full ROM   Neurological: She is alert and oriented to person, place, and time.          Assessment & Plan:  Treat the UTI with Keflex. Culture the sample. Set up an MRI of the thoracic spine to investigate the cause of her back pain.  Laurey Morale, MD

## 2016-03-12 LAB — URINE CULTURE
COLONY COUNT: NO GROWTH
ORGANISM ID, BACTERIA: NO GROWTH

## 2016-03-15 ENCOUNTER — Ambulatory Visit
Admission: RE | Admit: 2016-03-15 | Discharge: 2016-03-15 | Disposition: A | Discharge status: Home / Self Care | Payer: Medicare Other | Source: Ambulatory Visit | Attending: Family Medicine | Admitting: Family Medicine

## 2016-03-15 DIAGNOSIS — M546 Pain in thoracic spine: Secondary | ICD-10-CM

## 2016-03-16 ENCOUNTER — Telehealth: Payer: Self-pay | Admitting: Family Medicine

## 2016-03-16 MED ORDER — FLUCONAZOLE 150 MG PO TABS: 150.0000 mg | ORAL_TABLET | Freq: Once | ORAL | Status: DC

## 2016-03-16 NOTE — Telephone Encounter (Signed)
I sent script e-scribe and spoke with pt. 

## 2016-03-16 NOTE — Telephone Encounter (Signed)
Pt was recently on a antibiotic and now has yeast infection. Can you give a script for Diflucan and send to [facility]?

## 2016-03-16 NOTE — Telephone Encounter (Signed)
Per Dr. Sarajane Jews order Diflucan 150 mg take once # 1 with 6 refills.

## 2016-03-29 ENCOUNTER — Encounter: Payer: Self-pay | Admitting: Family Medicine

## 2016-03-29 ENCOUNTER — Telehealth: Payer: Self-pay | Admitting: *Deleted

## 2016-03-29 ENCOUNTER — Ambulatory Visit: Payer: Medicare Other | Admitting: Adult Health

## 2016-03-29 ENCOUNTER — Ambulatory Visit (INDEPENDENT_AMBULATORY_CARE_PROVIDER_SITE_OTHER): Payer: Medicare Other | Admitting: Family Medicine

## 2016-03-29 VITALS — BP 148/76 | HR 75 | Temp 98.2°F | Resp 12 | Ht 67.5 in

## 2016-03-29 DIAGNOSIS — I8311 Varicose veins of right lower extremity with inflammation: Secondary | ICD-10-CM

## 2016-03-29 DIAGNOSIS — I8312 Varicose veins of left lower extremity with inflammation: Secondary | ICD-10-CM

## 2016-03-29 DIAGNOSIS — I872 Venous insufficiency (chronic) (peripheral): Secondary | ICD-10-CM

## 2016-03-29 DIAGNOSIS — Z23 Encounter for immunization: Secondary | ICD-10-CM | POA: Diagnosis not present

## 2016-03-29 DIAGNOSIS — S8991XA Unspecified injury of right lower leg, initial encounter: Secondary | ICD-10-CM | POA: Diagnosis not present

## 2016-03-29 DIAGNOSIS — R42 Dizziness and giddiness: Secondary | ICD-10-CM

## 2016-03-29 DIAGNOSIS — R296 Repeated falls: Secondary | ICD-10-CM

## 2016-03-29 DIAGNOSIS — R399 Unspecified symptoms and signs involving the genitourinary system: Secondary | ICD-10-CM

## 2016-03-29 MED ORDER — ALUM SULFATE-CA ACETATE EX PACK: 1.0000 | PACK | Freq: Three times a day (TID) | CUTANEOUS | Status: DC

## 2016-03-29 MED ORDER — SILVER SULFADIAZINE 1 % EX CREA: 1.0000 "application " | TOPICAL_CREAM | Freq: Every day | CUTANEOUS | Status: AC

## 2016-03-29 NOTE — Telephone Encounter (Signed)
  [facility] - Client [facility] Patient Name: Melanie Mcdonald Gender: Female DOB: 09-Jun-1933 Age: 82 Y 57 M 5 D Return Phone Number: [phone] (Primary) Address: City/State/Zip: [place] Client [facility] - Client Client Site [facility] - Night Contact Type Call Who Is Calling Patient / Member / Family / Caregiver Call Type Triage / Clinical Relationship To Patient Self Return Phone Number [phone] (Primary) Chief Complaint Leg Swelling And Edema Reason for Call Request to [facility] Appointment Initial Comment Caller states legs are swollen, draining with blisters. Caller states needs to cancel appt at 7:00 PreDisposition Call Doctor Translation No Nurse Assessment Nurse: Wynetta Emery, RN, Baker Janus Date/Time Eilene Ghazi Time): 03/29/2016 6:18:12 AM Confirm and document reason for call. If symptomatic, describe symptoms. You must click the next button to save text entered. ---[name] is diabetic -- scratched leg with piece of metal and legs are swollen blisters on legs and leaking -- was to get a tetanus this am hurt leg on Friday Has the patient traveled out of the country within the last 30 days? ---No Does the patient have any new or worsening symptoms? ---Yes Will a triage be completed? ---Yes Related visit to physician within the last 2 weeks? ---No Does the PT have any chronic conditions? (i.e. diabetes, asthma, etc.) ---Yes List chronic conditions. ---Diabetes Is this a behavioral health or substance abuse call? ---No Guidelines Guideline Title Affirmed Question Affirmed Notes Nurse Date/Time Eilene Ghazi Time) Leg Swelling and Edema SEVERE leg swelling (e.g., swelling extends above knee, entire leg is swollen, weeping fluid) Wynetta Emery, RN, Baker Janus 03/29/2016 6:21:53 AM Disp. Time Eilene Ghazi Time) Disposition Final User 03/29/2016 6:23:10 AM See Physician within 4  Hours (or PCP triage) Yes Wynetta Emery, RN, Baker Janus PLEASE NOTE: All timestamps contained within this report are represented as [place] Standard Time. CONFIDENTIALTY NOTICE: This fax transmission is intended only for the addressee. It contains information that is legally privileged, confidential or otherwise protected from use or disclosure. If you are not the intended recipient, you are strictly prohibited from reviewing, disclosing, copying using or disseminating any of this information or taking any action in reliance on or regarding this information. If you have received this fax in error, please notify us immediately by telephone so that we can arrange for its return to [place]. Phone: [phone], Toll-Free: [phone], Fax: [phone] Page: 2 of 2 Call Id: [id number] [place] Understands: Yes Disagree/Comply: Comply Care Advice Given Per Guideline SEE PHYSICIAN WITHIN 4 HOURS (or PCP triage): * You become worse. CARE ADVICE given per Leg Swelling and Edema (Adult) guideline. Comments User: Michele Rockers, RN Date/Time Eilene Ghazi Time): 03/29/2016 6:24:13 AM NOTE [name] wanted to cancel appt advised to try to make appt if legs area in that bad of shape and she has a cut to the leg. Referrals REFERRED TO PCP OFFICE

## 2016-03-29 NOTE — Progress Notes (Signed)
HPI:  ACUTE VISIT:   Chief Complaint  Patient presents with  . cut on leg    Pt hasn't been taking her fluid pill like she should, there are little fluid spots around the cut.  . Trouble with kidneys    kidney infection- ongoing, Dr. Sarajane Jews has tried antibiotics.    [name] is a 82 y.o. female, who is here today complaining of small skin laceration after she hit leg against a metal table about 5 days ago. She has noted clear drainage from some areas of her RLE and edema on LE bilateral She has Hx of LE edema and had stopped diuretic, resumed it yesterday. She denies any fever, chills, or MS changes. She also would like a Tetanus vaccine.  -She also would like her urine check. States that sine Easter she has had occasional "discomfort" with urination, she is taking Azo OTC and it helps. She has not had abdominal pain, nausea, or vomiting. Left lower back pain after fall a few days ago. She has Hx of lower back pain, according to pt, she was not a good candidate for surgery due to her chronic medical problems.  She denies gross hematuria, changes in frequency or urgency. 03/07/16 Ucx no growth. Hx of DM II, BS 130's.  -She also states that she is falling frequently, which has happened for 1-2 years but worse for the past 6 months. She attributes it to dizziness, worse when she gets up fast. She denies head trauma or LOC. She has a walker. Hx of HTN, she is not checking her BP at home. She is on non pharmacologic treatment.   Last fall was a week ago, felt on abdomen and sore.  Denies nausea, vomiting, changes in bowel habits, blood in stool .  She lives with her son, who works, so she stays by herself during the day.  One of her sons died mother's day this year. + Fatigue, decreased appetite. Crying a few times during her visit.     Review of Systems  Constitutional: Positive for appetite change and fatigue. Negative for fever, activity change and unexpected  weight change.  HENT: Negative for mouth sores, nosebleeds and trouble swallowing.   Eyes: Negative for redness and visual disturbance.  Respiratory: Negative for cough, shortness of breath and wheezing.   Cardiovascular: Positive for leg swelling. Negative for chest pain and palpitations.  Gastrointestinal: Negative for nausea, vomiting and abdominal pain.       Negative for changes in bowel habits.  Genitourinary: Negative for dysuria, hematuria, decreased urine volume and difficulty urinating.  Musculoskeletal: Positive for myalgias, back pain and gait problem.  Skin: Positive for wound. Negative for color change.  Neurological: Positive for dizziness. Negative for seizures, syncope, weakness, numbness and headaches.  Psychiatric/Behavioral: Negative for suicidal ideas, hallucinations and confusion. The patient is nervous/anxious.       Current Outpatient Prescriptions on File Prior to Visit  Medication Sig Dispense Refill  . albuterol (PROVENTIL HFA;VENTOLIN HFA) 108 (90 BASE) MCG/ACT inhaler Inhale 2 puffs into the lungs every 4 (four) hours as needed for wheezing or shortness of breath. 1 Inhaler 3  . buPROPion (WELLBUTRIN XL) 150 MG 24 hr tablet Take 1 tablet (150 mg total) by mouth daily. 90 tablet 3  . cyclobenzaprine (FLEXERIL) 10 MG tablet Take 1 tablet (10 mg total) by mouth at bedtime. 30 tablet 5  . dextroamphetamine (DEXEDRINE SPANSULE) 10 MG 24 hr capsule Take 20 mg by mouth daily as  needed (alertness). Reported on 02/08/2016  0  . diphenoxylate-atropine (LOMOTIL) 2.5-0.025 MG tablet Take 2 tablets by mouth 4 (four) times daily as needed for diarrhea or loose stools. 120 tablet 2  . ENSURE (ENSURE) Take 237 mLs by mouth 3 (three) times a week. Reported on 02/08/2016    . fluconazole (DIFLUCAN) 150 MG tablet Take 1 tablet (150 mg total) by mouth once. 1 tablet 6  . fluticasone (FLONASE) 50 MCG/ACT nasal spray Place 2 sprays into both nostrils daily. 16 g 1  .  Fluticasone-Salmeterol (ADVAIR DISKUS) 250-50 MCG/DOSE AEPB Inhale 1 puff into the lungs 2 (two) times daily. 60 each 6  . furosemide (LASIX) 40 MG tablet Take 1 tablet (40 mg total) by mouth 2 (two) times daily. TAKE 1 IN THE MORNING AND ONE IN THE EVENING 60 tablet 11  . gabapentin (NEURONTIN) 300 MG capsule Take 1 capsule (300 mg total) by mouth 3 (three) times daily. 90 capsule 5  . HYDROcodone-homatropine (HYDROMET) 5-1.5 MG/5ML syrup Take 5 mLs by mouth every 4 (four) hours as needed. 240 mL 0  . Insulin Aspart Prot & Aspart (NOVOLOG 70/30 MIX) (70-30) 100 UNIT/ML Pen Take 25 units with breakfast, 15 units with lunch and 25 units with dinner 45 mL 0  . Insulin Glargine (LANTUS SOLOSTAR) 100 UNIT/ML Solostar Pen INJECT 100 UNITS UNDER THE SKIN AT BEDTIME and inject 50 units in the morning 135 mL 3  . LORazepam (ATIVAN) 1 MG tablet Take 1 tablet (1 mg total) by mouth every 6 (six) hours as needed for anxiety. 120 tablet 2  . meclizine (ANTIVERT) 25 MG tablet Take 1 tablet (25 mg total) by mouth every 4 (four) hours as needed for dizziness. 300 tablet 3  . metolazone (ZAROXOLYN) 2.5 MG tablet Take 1 tablet (2.5 mg total) by mouth daily. TAKE THIS 30 MINUTES PRIOR TO YOUR MORNING DOSE OF LASIX 90 tablet 3  . nitroGLYCERIN (NITROSTAT) 0.4 MG SL tablet Place 1 tablet (0.4 mg total) under the tongue every 5 (five) minutes as needed for chest pain. 50 tablet 5  . ondansetron (ZOFRAN) 8 MG tablet Take 1 tablet (8 mg total) by mouth every 8 (eight) hours as needed for nausea or vomiting. 90 tablet 5  . OVER THE COUNTER MEDICATION Apply 1 application topically at bedtime. Reported on 02/23/2016    . oxyCODONE-acetaminophen (PERCOCET) 10-325 MG tablet Take 1 tablet by mouth every 6 (six) hours as needed for pain. 120 tablet 0  . oxymetazoline (AFRIN) 0.05 % nasal spray Place 1 spray into both nostrils at bedtime as needed for congestion. Reported on 02/15/2016    . phenazopyridine (PYRIDIUM) 200 MG tablet Take  1 tablet (200 mg total) by mouth 3 (three) times daily as needed for pain. Reported on 01/21/2016 90 tablet 5  . pramipexole (MIRAPEX) 1 MG tablet Take 6 tablets (6 mg total) by mouth daily. 180 tablet 5  . zolpidem (AMBIEN) 10 MG tablet Take 1 tablet (10 mg total) by mouth at bedtime. 30 tablet 5   No current facility-administered medications on file prior to visit.     Past Medical History  Diagnosis Date  . ANEMIA 01/30/2008  . ARTHRITIS 12/11/2007  . BACK PAIN, CHRONIC 06/23/2008  . BREAST CYST, RIGHT 12/17/2007  . CONTACT DERMATITIS 03/10/2009  . CORONARY ARTERY DISEASE 03/01/2007    had a normal Myoview stress test 07-13-11  . CYSTOCELE WITHOUT MENTION UTERINE PROLAPSE LAT 09/12/2010  . DEGENERATIVE JOINT DISEASE 08/07/2007  . DEPRESSION 03/01/2007  .  Esophageal reflux 11/06/2007  . HYPERKERATOSIS 06/02/2009  . HYPERLIPIDEMIA 08/26/2008  . HYPERTENSION 03/01/2007  . Irritable bowel syndrome 01/26/2009  . LABYRINTHITIS 11/03/2009  . Narcolepsy without cataplexy 08/26/2008  . OBESITY 03/01/2007  . PERIPHERAL VASCULAR DISEASE 01/26/2009  . SYNCOPE 08/26/2008    had brain MRI on 06-28-12 showing only chronic microvascular ischemia and atrophy   . TRANSIENT ISCHEMIC ATTACK 10/20/2009    had normal brain MRA with patent vertebrals and carotids 06-28-12  . NEPHROLITHIASIS 04/29/2009  . COPD (chronic obstructive pulmonary disease) ([place])   . Colon polyps     FRAGMENTS OF HYPERPLASTIC POLYP  . Diverticulosis of colon (without mention of hemorrhage)   . DIABETES MELLITUS, TYPE II 08/26/2008    sees Dr. Dwyane Dee    Allergies  Allergen Reactions  . Aspirin Other (See Comments)    Abdominal pain  . Ciprofloxacin     Made pt very sick on stomach  . Clarithromycin     Made stomach burn  . Doxycycline Itching  . Erythromycin Nausea And Vomiting  . Lyrica [Pregabalin] Other (See Comments)    Suicidal   . Macrobid [Nitrofurantoin Monohyd Macro] Nausea And Vomiting  . Metolazone     Pt stated this made  her BP drop  . Other     Pt cannot have any of the MYCIN's  . Penicillins Swelling    Has patient had a PCN reaction causing immediate rash, facial/tongue/throat swelling, SOB or lightheadedness with hypotension:  Has patient had a PCN reaction causing severe rash involving mucus membranes or skin necrosis:  Has patient had a PCN reaction that required hospitalization  Has patient had a PCN reaction occurring within the last 10 years:  If all of the above answers are "NO", then may proceed with Cephalosporin use. Childhood allergy  . Sulfonamide Derivatives Nausea And Vomiting    Social History   Social History  . Marital Status: Widowed    Spouse Name: N/A  . Number of Children: N/A  . Years of Education: N/A   Social History Main Topics  . Smoking status: Never Smoker   . Smokeless tobacco: Never Used  . Alcohol Use: No  . Drug Use: No  . Sexual Activity: No   Other Topics Concern  . None   Social History Narrative    Filed Vitals:   03/29/16 0830  BP: 148/76  Pulse: 75  Temp: 98.2 F (36.8 C)  Resp: 12   There is no weight on file to calculate BMI.      Physical Exam  Constitutional: She is oriented to person, place, and time. She appears well-developed. No distress.  HENT:  Head: Atraumatic.  Eyes: Conjunctivae and EOM are normal. Pupils are equal, round, and reactive to light.  Cardiovascular: Normal rate and regular rhythm.   No murmur heard. Respiratory: Effort normal and breath sounds normal. No respiratory distress.  GI: Soft. She exhibits no mass. There is tenderness (abdominal wall) in the periumbilical area. There is no rigidity and no guarding.  Musculoskeletal: She exhibits no edema.   + tenderness upon palpation of lumbar paraspinal muscles L>R. No tenderness on vertebral bodies.    Neurological: She is alert and oriented to person, place, and time. She has normal strength. Coordination normal.  Skin: Skin is warm. No erythema.     2 mm  skin laceration lateral aspect of right calf. Clear drainage dripping down right leg from area of injury and another skin superficial ulcer, 2-3 mm. + erythema on pretibial  area bilateral.  Psychiatric: Her affect is labile. She exhibits a depressed mood.  Poor groomed, good eye contact.      ASSESSMENT AND PLAN:     Adamary was seen today for cut on leg and trouble with kidneys.  Diagnoses and all orders for this visit:  Venous stasis dermatitis of both lower extremities  RLE with clear drainage, [place] may help dry areas. OTC topical steroid. LE elevation and when lesions healed compression stocking may be considered. Continue Furosemide to control edema, some side effects discussed.   -     aluminum sulfate-calcium acetate (DOMEBORO) packet; Apply 1 packet topically 3 (three) times daily.  Leg injury, right, initial encounter   Keep area clean with soap and water. LE elevation and monitor for signs of infection. F/U in 5-7 days, before if needed.  -     silver sulfADIAZINE (SILVADENE) 1 % cream; Apply 1 application topically daily. -     Td : Tetanus/diphtheria >7yo Preservative  free  Frequent falls   OA/back pain, polyneuropathy,depression among some + some of her medications can increase the risk of falls.  Fall precautions discussed. She may benefit of PT.  Urinary symptom or sign  Symptoms seem to be chronic. Since she had Ucx when having similar complain and no growth I do not think another Ucx is needed today. She has had several Ucx's and most did not meet criteria for UTI, < 100,000 CFU or not growth. Also she has several abx allergies.  Dizziness and giddiness  Possible causes: chronic illness and medications among some possible causes. Recommend getting up slow, use her walker all the time. She may want to decrease and/or stop some of her medications.  Adequate hydration.         -Ms.DYANA CARICO advised to return or notify a doctor  immediately if symptoms worsen or persist or new concerns arise, she voices understanding.       Betty G. Martinique, MD  [facility]. [place] office.

## 2016-03-29 NOTE — Patient Instructions (Signed)
A few things to remember from today's visit:   1. Venous stasis dermatitis of both lower extremities   2. Venous ulcer of right lower extremity with varicose veins (HCC)    Vein disease is a condition that can affect the veins in the legs. It can cause leg pain, varicose veins, swollen legs, or open sores. Varicose veins are swollen and twisted veins. Things that may help: leg exercises (ankle flexion, walking),compression stocking, OTC horse chestnut seed extract 300 mg twice daily, for itchy skin cortisone and moisturizers.  Compression stockings when ulcer heals may help:- [facility] [place]  - aluminum sulfate-calcium acetate (DOMEBORO) packet; Apply 1 packet topically 3 (three) times daily.  Dispense: 14 each; Refill: 0 - silver sulfADIAZINE (SILVADENE) 1 % cream; Apply 1 application topically daily.  Dispense: 50 g; Refill: 0  3. Frequent falls Move slow, always with walker.   4. Essential hypertension Monitor blood pressure at home, goal less than 150/90.    Urine culture 03/07/16 negative. Some of your medications can cause dizziness and falls.    If you sign-up for My chart, you can communicate easier with [place] in case you have any question or concern.

## 2016-03-29 NOTE — Telephone Encounter (Signed)
Pt seen in an OV this morning.

## 2016-03-29 NOTE — Progress Notes (Signed)
Pre visit review using our clinic review tool, if applicable. No additional management support is needed unless otherwise documented below in the visit note. 

## 2016-04-05 ENCOUNTER — Encounter: Payer: Self-pay | Admitting: Family Medicine

## 2016-04-05 ENCOUNTER — Ambulatory Visit (INDEPENDENT_AMBULATORY_CARE_PROVIDER_SITE_OTHER): Payer: Medicare Other | Admitting: Family Medicine

## 2016-04-05 VITALS — BP 169/90 | HR 91 | Temp 98.3°F

## 2016-04-05 DIAGNOSIS — S81811D Laceration without foreign body, right lower leg, subsequent encounter: Secondary | ICD-10-CM

## 2016-04-05 DIAGNOSIS — I5033 Acute on chronic diastolic (congestive) heart failure: Secondary | ICD-10-CM | POA: Diagnosis not present

## 2016-04-05 DIAGNOSIS — I10 Essential (primary) hypertension: Secondary | ICD-10-CM | POA: Diagnosis not present

## 2016-04-05 DIAGNOSIS — R531 Weakness: Secondary | ICD-10-CM

## 2016-04-05 DIAGNOSIS — M7989 Other specified soft tissue disorders: Secondary | ICD-10-CM

## 2016-04-05 HISTORY — DX: Other specified soft tissue disorders: M79.89

## 2016-04-05 MED ORDER — LISINOPRIL 10 MG PO TABS: 10.0000 mg | ORAL_TABLET | Freq: Every day | ORAL | Status: DC

## 2016-04-05 NOTE — Progress Notes (Signed)
   Subjective:    Patient ID: Melanie Mcdonald, female    DOB: [birth year], 82 y.o.   MRN: [id number]  HPI Here for several issues. First she was seen a week ago for a laceration on the right leg. She got a tetanus shot and has been applying Silvadene to it. It seems to be healing. Also she has been having muscle cramps and asks for advice. Also her BP has been running a little high recently and she has been having frequent mild headaches. No chest pain but she has been more SOB than usual. Lastly she has been having a lot of swelling in the legs.  She had stopped taking Lasix for a few weeks but has resumed by taking 20 mg bid.    Review of Systems  Constitutional: Negative.   Respiratory: Positive for shortness of breath. Negative for cough and wheezing.   Cardiovascular: Positive for leg swelling. Negative for chest pain and palpitations.  Neurological: Positive for headaches.       Objective:   Physical Exam  Constitutional: She is oriented to person, place, and time.  In a wheelchair  Cardiovascular: Normal rate, regular rhythm, normal heart sounds and intact distal pulses.   Pulmonary/Chest: Effort normal and breath sounds normal. No respiratory distress. She has no wheezes. She has no rales.  Musculoskeletal:  3+ edema in both lower legs.   Neurological: She is alert and oriented to person, place, and time.  Skin:  The skin on the right calf has closed           Assessment & Plan:  For the leg edema she will increase the Lasix to 40 mg bid. The leg laceration has healed. She has some HTN now and we will start her on Lisinopril 10 mg daily. Try taking 400 mg of OTC magnesium daily for the leg cramps.  Laurey Morale, MD

## 2016-04-05 NOTE — Progress Notes (Signed)
Pre visit review using our clinic review tool, if applicable. No additional management support is needed unless otherwise documented below in the visit note. Pt unable to weigh 

## 2016-04-06 LAB — HM DIABETES EYE EXAM

## 2016-04-07 ENCOUNTER — Encounter: Payer: Self-pay | Admitting: Family Medicine

## 2016-04-11 ENCOUNTER — Inpatient Hospital Stay: Admission: RE | Admit: 2016-04-11 | Payer: Medicare Other | Source: Ambulatory Visit

## 2016-04-13 NOTE — ED Provider Notes (Signed)
Medical screening examination/treatment/procedure(s) were conducted as a shared visit with non-physician practitioner(s) and myself.  I personally evaluated the patient during the encounter.  Agree with plan.   EKG Interpretation None       Isla Pence, MD 04/13/16 1339

## 2016-05-02 ENCOUNTER — Ambulatory Visit (INDEPENDENT_AMBULATORY_CARE_PROVIDER_SITE_OTHER): Payer: Medicare Other | Admitting: Family Medicine

## 2016-05-02 ENCOUNTER — Encounter: Payer: Self-pay | Admitting: Family Medicine

## 2016-05-02 VITALS — BP 160/79 | HR 102 | Temp 98.2°F | Ht 67.5 in | Wt 248.0 lb

## 2016-05-02 DIAGNOSIS — L509 Urticaria, unspecified: Secondary | ICD-10-CM | POA: Diagnosis not present

## 2016-05-02 MED ORDER — OXYCODONE-ACETAMINOPHEN 10-325 MG PO TABS: 1.0000 | ORAL_TABLET | Freq: Four times a day (QID) | ORAL | 0 refills | Status: DC | PRN

## 2016-05-02 NOTE — Progress Notes (Signed)
   Subjective:    Patient ID: Melanie Mcdonald, female    DOB: Jun 09, 1933, 82 y.o.   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 The right temple was the involved area and she could see and feel several small bumps under the skin. Over several hours the bumps went away but the itching and burning persisted. This am she feels much better and the burning has almost disappeared. No eye symptoms. She is concerned that this could be shingles.    Review of Systems  Constitutional: Negative.   HENT: Positive for facial swelling.   Eyes: Negative.   Respiratory: Negative.   Neurological: Negative.        Objective:   Physical Exam  Constitutional: She is oriented to person, place, and time. She appears well-developed and well-nourished. No distress.  HENT:  Head: Normocephalic and atraumatic.  Right Ear: External ear normal.  Left Ear: External ear normal.  Nose: Nose normal.  Mouth/Throat: Oropharynx is clear and moist.  Eyes: Conjunctivae are normal. Pupils are equal, round, and reactive to light.  Neck: No thyromegaly present.  Lymphadenopathy:    She has no cervical adenopathy.  Neurological: She is alert and oriented to person, place, and time.  Skin:  No visible lesions on the face           Assessment & Plan:  I think this was a hives like allergic reaction to the chemicals she was working with rather than shingles. She will monitor her skin closely and see [place] again if she sees any vesicles or rash appear. I suggested she wash her hands carefully when preparing these foods again.  Laurey Morale, MD

## 2016-05-02 NOTE — Progress Notes (Signed)
Pre visit review using our clinic review tool, if applicable. No additional management support is needed unless otherwise documented below in the visit note. 

## 2016-05-16 ENCOUNTER — Ambulatory Visit: Payer: Medicare Other | Admitting: Family Medicine

## 2016-05-25 NOTE — Telephone Encounter (Signed)
eorror

## 2016-06-01 ENCOUNTER — Telehealth: Payer: Self-pay | Admitting: Family Medicine

## 2016-06-01 NOTE — Telephone Encounter (Signed)
Pt needs PA for lantus solostar please call  [phone]

## 2016-06-06 ENCOUNTER — Other Ambulatory Visit: Payer: Self-pay | Admitting: Family Medicine

## 2016-06-06 NOTE — Telephone Encounter (Signed)
Looks like pt is due for A1c.

## 2016-06-06 NOTE — Telephone Encounter (Signed)
Refill request for Novolog 70/30 Mix and a 90 day supply to [facility].

## 2016-06-06 NOTE — Telephone Encounter (Signed)
Pt has appointment on 06/07/2016 will discuss then.

## 2016-06-06 NOTE — Telephone Encounter (Signed)
Preferred medications are Agricultural engineer, Levemir FlexTouch, Levemir and [employer].  Proceed with pa for Lantus?

## 2016-06-06 NOTE — Telephone Encounter (Signed)
Pt has appointment on 06/07/2016 will discuss then and get labs if needed.

## 2016-06-07 ENCOUNTER — Telehealth: Payer: Self-pay

## 2016-06-07 ENCOUNTER — Ambulatory Visit (INDEPENDENT_AMBULATORY_CARE_PROVIDER_SITE_OTHER): Payer: Medicare Other | Admitting: Family Medicine

## 2016-06-07 ENCOUNTER — Encounter: Payer: Self-pay | Admitting: Family Medicine

## 2016-06-07 VITALS — BP 150/82 | Temp 98.2°F

## 2016-06-07 DIAGNOSIS — I10 Essential (primary) hypertension: Secondary | ICD-10-CM

## 2016-06-07 DIAGNOSIS — M25561 Pain in right knee: Secondary | ICD-10-CM | POA: Diagnosis not present

## 2016-06-07 DIAGNOSIS — M159 Polyosteoarthritis, unspecified: Secondary | ICD-10-CM

## 2016-06-07 DIAGNOSIS — M109 Gout, unspecified: Secondary | ICD-10-CM

## 2016-06-07 DIAGNOSIS — M10071 Idiopathic gout, right ankle and foot: Secondary | ICD-10-CM | POA: Diagnosis not present

## 2016-06-07 DIAGNOSIS — Z794 Long term (current) use of insulin: Secondary | ICD-10-CM | POA: Diagnosis not present

## 2016-06-07 DIAGNOSIS — M150 Primary generalized (osteo)arthritis: Secondary | ICD-10-CM

## 2016-06-07 DIAGNOSIS — E119 Type 2 diabetes mellitus without complications: Secondary | ICD-10-CM | POA: Diagnosis not present

## 2016-06-07 HISTORY — DX: Gout, unspecified: M10.9

## 2016-06-07 LAB — POCT GLYCOSYLATED HEMOGLOBIN (HGB A1C): HEMOGLOBIN A1C: 8.9

## 2016-06-07 MED ORDER — METHYLPREDNISOLONE ACETATE 80 MG/ML IJ SUSP
120.0000 mg | Freq: Once | INTRAMUSCULAR | Status: AC
  Administered 2016-06-07: 120 mg via INTRAMUSCULAR

## 2016-06-07 MED ORDER — INSULIN ASPART PROT & ASPART (70-30 MIX) 100 UNIT/ML PEN: PEN_INJECTOR | SUBCUTANEOUS | 3 refills | Status: DC

## 2016-06-07 MED ORDER — PREDNISONE 10 MG PO TABS: ORAL_TABLET | ORAL | 0 refills | Status: DC

## 2016-06-07 MED ORDER — INSULIN GLARGINE 100 UNIT/ML SOLOSTAR PEN: PEN_INJECTOR | SUBCUTANEOUS | 3 refills | Status: DC

## 2016-06-07 NOTE — Progress Notes (Signed)
   Subjective:    Patient ID: Melanie Mcdonald, female    DOB: 1933-04-09, 82 y.o.   MRN: [id number]  HPI Here for 2 weeks of redness, swelling, and severe pain in the right great toe. No recent trauma. No history of gout. Also her right knee has been much more painful than usual for a few months.    Review of Systems  Constitutional: Negative.   Respiratory: Negative.   Cardiovascular: Negative.   Musculoskeletal: Positive for arthralgias, gait problem and joint swelling.       Objective:   Physical Exam  Constitutional:  In pain, using her walker   Cardiovascular: Normal rate, regular rhythm, normal heart sounds and intact distal pulses.   Pulmonary/Chest: Effort normal and breath sounds normal.  Musculoskeletal:  The right knee is swollen and tender along both joint spaces, ROM is reduced. Not red or warm. The right great toe is red, warm, swollen, and extremely tender over the MTP          Assessment & Plan:  She is having an acute gout attack in the right great toe. We will treat this with a steroid shot today to be followed by a 16 day Prednisone taper. She also has chronic right knee pain from osteoarthritis, and we will refer her back to see Dr. Tonita Cong for this.  Laurey Morale, MD

## 2016-06-07 NOTE — Telephone Encounter (Signed)
PA submitted via covermymeds for patient's Lantus Rx. Key: [id number]

## 2016-06-07 NOTE — Progress Notes (Signed)
Pre visit review using our clinic review tool, if applicable. No additional management support is needed unless otherwise documented below in the visit note. Pt unable to weigh 

## 2016-06-08 ENCOUNTER — Telehealth: Payer: Self-pay | Admitting: Family Medicine

## 2016-06-08 NOTE — Telephone Encounter (Signed)
Error/ltd ° °

## 2016-06-09 MED ORDER — INSULIN PEN NEEDLE 30G X 8 MM MISC: 1 refills | Status: DC

## 2016-06-09 MED ORDER — INSULIN DETEMIR 100 UNIT/ML FLEXPEN: PEN_INJECTOR | SUBCUTANEOUS | 1 refills | Status: DC

## 2016-06-09 NOTE — Telephone Encounter (Signed)
I spoke pt, sent in new script for Levemir and pen needles to [facility] per pt request, also updated medication list in chart.

## 2016-06-09 NOTE — Telephone Encounter (Signed)
Please switch from Lantus to Levemir and send in new prescriptions. Use the same doses, etc.

## 2016-06-09 NOTE — Telephone Encounter (Signed)
Insurance will cover Levemir, Engineer, agricultural or [place]. I will need to send in new script to mail order and notify patient.

## 2016-06-12 ENCOUNTER — Other Ambulatory Visit: Payer: Self-pay | Admitting: Family Medicine

## 2016-06-12 ENCOUNTER — Telehealth: Payer: Self-pay | Admitting: Family Medicine

## 2016-06-12 NOTE — Telephone Encounter (Signed)
Call in #30 with 5 rf 

## 2016-06-12 NOTE — Telephone Encounter (Signed)
Last filled on 11/29/15 #30 + 5. Last OV 06/07/16. Ok to refill?

## 2016-06-12 NOTE — Telephone Encounter (Signed)
Pt saw dr dean orthopaedic today and she can not take prednisone  It makes her sick. Please advise

## 2016-06-13 ENCOUNTER — Encounter: Payer: Self-pay | Admitting: Family Medicine

## 2016-06-13 ENCOUNTER — Ambulatory Visit (INDEPENDENT_AMBULATORY_CARE_PROVIDER_SITE_OTHER): Payer: Medicare Other | Admitting: Family Medicine

## 2016-06-13 VITALS — BP 150/80 | HR 89 | Temp 98.0°F | Ht 68.0 in

## 2016-06-13 DIAGNOSIS — R42 Dizziness and giddiness: Secondary | ICD-10-CM

## 2016-06-13 DIAGNOSIS — L03115 Cellulitis of right lower limb: Secondary | ICD-10-CM

## 2016-06-13 DIAGNOSIS — Z23 Encounter for immunization: Secondary | ICD-10-CM

## 2016-06-13 HISTORY — DX: Cellulitis of right lower limb: L03.115

## 2016-06-13 MED ORDER — ONDANSETRON HCL 8 MG PO TABS: 8.0000 mg | ORAL_TABLET | Freq: Three times a day (TID) | ORAL | 5 refills | Status: DC | PRN

## 2016-06-13 MED ORDER — CEPHALEXIN 500 MG PO CAPS: 500.0000 mg | ORAL_CAPSULE | Freq: Three times a day (TID) | ORAL | 0 refills | Status: AC

## 2016-06-13 MED ORDER — SCOPOLAMINE 1 MG/3DAYS TD PT72: 1.0000 | MEDICATED_PATCH | TRANSDERMAL | 11 refills | Status: DC

## 2016-06-13 MED ORDER — GABAPENTIN 300 MG PO CAPS: 300.0000 mg | ORAL_CAPSULE | Freq: Three times a day (TID) | ORAL | 5 refills | Status: DC

## 2016-06-13 NOTE — Progress Notes (Signed)
   Subjective:    Patient ID: Melanie Mcdonald, female    DOB: Aug 19, 1933, 82 y.o.   MRN: [id number]  HPI Here to check a blister on the right great toe that may be getting infected. We saw her recently for a gout flare in the toe and started her on a Prednisone taper. She saw Dr. Tonita Cong yesterday to get an injection in the right knee, and he told her to see [place] for an antibiotic. She also mentions her chronic dizziness, which we have talked about numerous times over the years. Her workup has included a CT scan and an MRI of the head, all of which have been unrevealing. She has seen Neurology and they think this is the result of a number of factors, both central and peripheral. She has had mixed result from meclizine.    Review of Systems  Constitutional: Negative.   Respiratory: Negative.   Cardiovascular: Negative.   Musculoskeletal: Positive for arthralgias, gait problem and joint swelling.  Neurological: Positive for dizziness.       Objective:   Physical Exam  Constitutional: She is oriented to person, place, and time. She appears well-developed and well-nourished.  In a wheelchair   Cardiovascular: Normal rate, regular rhythm, normal heart sounds and intact distal pulses.   Pulmonary/Chest: Effort normal and breath sounds normal.  Neurological: She is alert and oriented to person, place, and time.  Skin:  The medial right great toe has a large blister on the side. The toe is pink but not as tender and not as warm as last week          Assessment & Plan:  Multifactorial dizziness, she will try Scopolamine patches. Given Keflex for early cellulitis on the foot. The gout flare seems to be calming down.  Laurey Morale, MD

## 2016-06-13 NOTE — Telephone Encounter (Signed)
Patient was seen by Dr Sarajane Jews today.

## 2016-06-13 NOTE — Telephone Encounter (Signed)
Call in #30 with 5 rf 

## 2016-06-15 ENCOUNTER — Telehealth: Payer: Self-pay | Admitting: *Deleted

## 2016-06-15 MED ORDER — INSULIN PEN NEEDLE 30G X 8 MM MISC: 3 refills | Status: DC

## 2016-06-15 NOTE — Telephone Encounter (Signed)
Received fax from [place] for PA on Novofine pen needles. Called [facility] and spoke to Quita Skye, answered questions and PA was approved for Novofine 30 8 mm needles for twice a day, #180 with 3 refills. PA approved for one year per Quita Skye. Told him okay will send new Rx over. Adam verbalized understanding. Rx sent to [facility].

## 2016-06-28 ENCOUNTER — Encounter: Payer: Self-pay | Admitting: Family Medicine

## 2016-06-28 ENCOUNTER — Ambulatory Visit (INDEPENDENT_AMBULATORY_CARE_PROVIDER_SITE_OTHER): Payer: Medicare Other | Admitting: Family Medicine

## 2016-06-28 VITALS — BP 150/79 | HR 86 | Temp 98.2°F | Ht 68.0 in | Wt 240.0 lb

## 2016-06-28 DIAGNOSIS — Z0000 Encounter for general adult medical examination without abnormal findings: Secondary | ICD-10-CM | POA: Diagnosis not present

## 2016-06-28 LAB — BASIC METABOLIC PANEL
BUN: 19 mg/dL (ref 6–23)
CALCIUM: 8.9 mg/dL (ref 8.4–10.5)
CHLORIDE: 104 meq/L (ref 96–112)
CO2: 31 mEq/L (ref 19–32)
CREATININE: 0.69 mg/dL (ref 0.40–1.20)
GFR: 86.8 mL/min (ref >60.00)
Glucose, Bld: 92 mg/dL (ref 70–99)
Potassium: 4.3 mEq/L (ref 3.5–5.1)
Sodium: 141 mEq/L (ref 135–145)

## 2016-06-28 LAB — CBC WITH DIFFERENTIAL/PLATELET
BASOS PCT: 0.4 % (ref 0.0–3.0)
Basophils Absolute: 0 10*3/uL (ref 0.0–0.1)
EOS ABS: 0.2 10*3/uL (ref 0.0–0.7)
EOS PCT: 1.8 % (ref 0.0–5.0)
HEMATOCRIT: 38.9 % (ref 36.0–46.0)
HEMOGLOBIN: 13 g/dL (ref 12.0–15.0)
LYMPHS PCT: 22.4 % (ref 12.0–46.0)
Lymphs Abs: 2.6 10*3/uL (ref 0.7–4.0)
MCHC: 33.5 g/dL (ref 30.0–36.0)
MCV: 86.7 fl (ref 78.0–100.0)
MONO ABS: 0.6 10*3/uL (ref 0.1–1.0)
Monocytes Relative: 4.8 % (ref 3.0–12.0)
Neutro Abs: 8.3 10*3/uL — ABNORMAL HIGH (ref 1.4–7.7)
Neutrophils Relative %: 70.6 % (ref 43.0–77.0)
Platelets: 249 10*3/uL (ref 150.0–400.0)
RBC: 4.49 Mil/uL (ref 3.87–5.11)
RDW: 14.2 % (ref 11.5–15.5)
WBC: 11.8 10*3/uL — AB (ref 4.0–10.5)

## 2016-06-28 LAB — LIPID PANEL
Cholesterol: 126 mg/dL (ref 0–200)
HDL: 57.6 mg/dL (ref >39.00)
LDL CALC: 59 mg/dL (ref 0–99)
NONHDL: 68.5
Total CHOL/HDL Ratio: 2
Triglycerides: 50 mg/dL (ref 0.0–149.0)
VLDL: 10 mg/dL (ref 0.0–40.0)

## 2016-06-28 LAB — HEPATIC FUNCTION PANEL
ALT: 18 U/L (ref 0–35)
AST: 16 U/L (ref 0–37)
Albumin: 3.6 g/dL (ref 3.5–5.2)
Alkaline Phosphatase: 84 U/L (ref 39–117)
BILIRUBIN DIRECT: 0.2 mg/dL (ref 0.0–0.3)
BILIRUBIN TOTAL: 0.8 mg/dL (ref 0.2–1.2)
Total Protein: 7.5 g/dL (ref 6.0–8.3)

## 2016-06-28 LAB — TSH: TSH: 0.89 u[IU]/mL (ref 0.35–4.50)

## 2016-06-28 MED ORDER — LOSARTAN POTASSIUM 50 MG PO TABS: 50.0000 mg | ORAL_TABLET | Freq: Every day | ORAL | 3 refills | Status: DC

## 2016-06-28 MED ORDER — INDOMETHACIN 50 MG PO CAPS: 50.0000 mg | ORAL_CAPSULE | Freq: Three times a day (TID) | ORAL | 3 refills | Status: DC | PRN

## 2016-06-28 MED ORDER — GLUCOSE BLOOD VI STRP: ORAL_STRIP | 1 refills | Status: DC

## 2016-06-28 NOTE — Progress Notes (Signed)
   Subjective:    Patient ID: Melanie Mcdonald, female    DOB: July 17, 1933, 82 y.o.   MRN: [id number]  HPI 82 yr old female for a well exam. She has a number of issues to discuss. One is a dry tickling cough that started a year ago. No sinus congestion or PND. Also she asks about something to take wen her gout flares up. Prednisone helps but this drives her glucoses very high. She is always unsteady on her feet and she falls occasionally. She has a walker at home but usually uses a cane. Her A1c on 06-07-16 was 8.9. Her BP has been stable.    Review of Systems  HENT: Negative.   Eyes: Negative.   Respiratory: Negative.   Cardiovascular: Negative.   Gastrointestinal: Negative.   Genitourinary: Negative for decreased urine volume, difficulty urinating, dyspareunia, dysuria, enuresis, flank pain, frequency, hematuria, pelvic pain and urgency.  Musculoskeletal: Positive for arthralgias, back pain, gait problem and joint swelling.  Skin: Negative.   Neurological: Positive for dizziness and weakness. Negative for tremors, seizures, syncope, facial asymmetry, speech difficulty, light-headedness, numbness and headaches.  Psychiatric/Behavioral: Negative.        Objective:   Physical Exam  Constitutional: She is oriented to person, place, and time. She appears well-developed and well-nourished. No distress.  Unsteady gait, using a cane  HENT:  Head: Normocephalic and atraumatic.  Right Ear: External ear normal.  Left Ear: External ear normal.  Nose: Nose normal.  Mouth/Throat: Oropharynx is clear and moist. No oropharyngeal exudate.  Eyes: Conjunctivae and EOM are normal. Pupils are equal, round, and reactive to light. No scleral icterus.  Neck: Normal range of motion. Neck supple. No JVD present. No thyromegaly present.  Cardiovascular: Normal rate, regular rhythm, normal heart sounds and intact distal pulses.  Exam reveals no gallop and no friction rub.   No murmur heard. Pulmonary/Chest:  Effort normal and breath sounds normal. No respiratory distress. She has no wheezes. She has no rales. She exhibits no tenderness.  Abdominal: Soft. Bowel sounds are normal. She exhibits no distension and no mass. There is no tenderness. There is no rebound and no guarding.  Musculoskeletal: Normal range of motion. She exhibits no edema or tenderness.  Lymphadenopathy:    She has no cervical adenopathy.  Neurological: She is alert and oriented to person, place, and time. She has normal reflexes. No cranial nerve deficit. She exhibits normal muscle tone. Coordination normal.  Skin: Skin is warm and dry. No rash noted. No erythema.  Psychiatric: She has a normal mood and affect. Her behavior is normal. Judgment and thought content normal.          Assessment & Plan:  Well exam. We discussed diet and exercise. She has been through PT and I think her gait is about as steady as it will ever be. I advised her to use her walker at all times instead of her cane. I think her cough is a side effect of her Lisinopril, so will stop this and substitute Losartan 50 mg daily. Try Indomethacin for gout flares. Get fasting labs today.  Laurey Morale, MD

## 2016-06-28 NOTE — Progress Notes (Signed)
Pre visit review using our clinic review tool, if applicable. No additional management support is needed unless otherwise documented below in the visit note. 

## 2016-06-28 NOTE — Addendum Note (Signed)
Addended by: Aggie Hacker A on: 06/28/2016 01:17 PM   Modules accepted: Orders

## 2016-07-12 ENCOUNTER — Other Ambulatory Visit: Payer: Self-pay | Admitting: Family Medicine

## 2016-08-16 ENCOUNTER — Encounter: Payer: Self-pay | Admitting: Adult Health

## 2016-08-16 ENCOUNTER — Telehealth: Payer: Self-pay | Admitting: Family Medicine

## 2016-08-16 ENCOUNTER — Ambulatory Visit (INDEPENDENT_AMBULATORY_CARE_PROVIDER_SITE_OTHER): Payer: Medicare Other | Admitting: Adult Health

## 2016-08-16 VITALS — BP 158/64 | Temp 98.6°F

## 2016-08-16 DIAGNOSIS — M545 Low back pain, unspecified: Secondary | ICD-10-CM

## 2016-08-16 MED ORDER — OXYCODONE-ACETAMINOPHEN 10-325 MG PO TABS: 1.0000 | ORAL_TABLET | Freq: Four times a day (QID) | ORAL | 0 refills | Status: DC | PRN

## 2016-08-16 NOTE — Progress Notes (Signed)
Subjective:    Patient ID: Melanie Mcdonald, female    DOB: 12/04/1932, 82 y.o.   MRN: [id number]  HPI  82 year old female who  has a past medical history of ANEMIA (01/30/2008); ARTHRITIS (12/11/2007); BACK PAIN, CHRONIC (06/23/2008); BREAST CYST, RIGHT (12/17/2007); Colon polyps; CONTACT DERMATITIS (03/10/2009); COPD (chronic obstructive pulmonary disease) ([place]); CORONARY ARTERY DISEASE (03/01/2007); CYSTOCELE WITHOUT MENTION UTERINE PROLAPSE LAT (09/12/2010); DEGENERATIVE JOINT DISEASE (08/07/2007); DEPRESSION (03/01/2007); DIABETES MELLITUS, TYPE II (08/26/2008); Diverticulosis of colon (without mention of hemorrhage); Esophageal reflux (11/06/2007); HYPERKERATOSIS (06/02/2009); HYPERLIPIDEMIA (08/26/2008); HYPERTENSION (03/01/2007); Irritable bowel syndrome (01/26/2009); LABYRINTHITIS (11/03/2009); Narcolepsy without cataplexy(347.00) (08/26/2008); NEPHROLITHIASIS (04/29/2009); OBESITY (03/01/2007); PERIPHERAL VASCULAR DISEASE (01/26/2009); SYNCOPE (08/26/2008); and TRANSIENT ISCHEMIC ATTACK (10/20/2009). She is a patient of Dr. Sarajane Jews who I am seeing today.   She reports that she fell in the bathroom x 2 days ago. She reports that she lost her balance and when she went to grab the bar on the wall she spun around And fell onto the toilet. She endorses feeling dizzy at the time of the fall but not have a syncopal episode and did not lose consciousness. She hit her right lower back on the edge of the toilet. Currently in the office she states "I just feel sore". She is ambulating with her rolling walker as she normally does. He is worse when going from a sitting to a standing position.   She has been worked up multiple times in the past for dizziness and syncopal episodes. From reviewing prior notes it appears that her exams have been unremarkable.   Review of Systems  Respiratory: Negative.   Cardiovascular: Negative.   Gastrointestinal: Negative.   Genitourinary: Negative.   Musculoskeletal: Positive for  arthralgias, back pain, gait problem and myalgias.  Skin: Positive for color change.  Neurological: Positive for dizziness and weakness. Negative for seizures, facial asymmetry, light-headedness and numbness.  Hematological: Negative.   Psychiatric/Behavioral: Negative.   All other systems reviewed and are negative.  Past Medical History:  Diagnosis Date  . ANEMIA 01/30/2008  . ARTHRITIS 12/11/2007  . BACK PAIN, CHRONIC 06/23/2008  . BREAST CYST, RIGHT 12/17/2007  . Colon polyps    FRAGMENTS OF HYPERPLASTIC POLYP  . CONTACT DERMATITIS 03/10/2009  . COPD (chronic obstructive pulmonary disease) ([place])   . CORONARY ARTERY DISEASE 03/01/2007   had a normal Myoview stress test 07-13-11  . CYSTOCELE WITHOUT MENTION UTERINE PROLAPSE LAT 09/12/2010  . DEGENERATIVE JOINT DISEASE 08/07/2007  . DEPRESSION 03/01/2007  . DIABETES MELLITUS, TYPE II 08/26/2008   sees Dr. Dwyane Dee   . Diverticulosis of colon (without mention of hemorrhage)   . Esophageal reflux 11/06/2007  . HYPERKERATOSIS 06/02/2009  . HYPERLIPIDEMIA 08/26/2008  . HYPERTENSION 03/01/2007  . Irritable bowel syndrome 01/26/2009  . LABYRINTHITIS 11/03/2009  . 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 NEPHROLITHIASIS 04/29/2009  . OBESITY 03/01/2007  . PERIPHERAL VASCULAR DISEASE 01/26/2009  . SYNCOPE 08/26/2008   had brain MRI on 06-28-12 showing only chronic microvascular ischemia and atrophy   . TRANSIENT ISCHEMIC ATTACK 10/20/2009   had normal brain MRA with patent vertebrals and carotids 06-28-12    Social History   Social History  . Marital status: Widowed    Spouse name: N/A  . Number of children: N/A  . Years of education: N/A   Occupational History  . Not on file.   Social History Main Topics  . Smoking status: Never Smoker  . Smokeless tobacco: Never Used  . Alcohol use No  . Drug  use: No  . Sexual activity: No   Other Topics Concern  . Not on file   Social History Narrative  . No narrative on file    Past  Surgical History:  Procedure Laterality Date  . ABDOMINAL HYSTERECTOMY    . BACK SURGERY     x2  . CARDIAC CATHETERIZATION  07/2009  . CATARACT EXTRACTION    . COLONOSCOPY  11-13-07   per Dr. Deatra Ina, benign polyps, repeat in 5 yrs  . ESOPHAGOGASTRODUODENOSCOPY  11-13-07   per Dr. Deatra Ina, normal   . INTRAOCULAR LENS INSERTION    . SPINE SURGERY     x 3  . TONSILECTOMY, ADENOIDECTOMY, BILATERAL MYRINGOTOMY AND TUBES      Family History  Problem Relation Age of Onset  . Lupus Mother   . COPD Father     Allergies  Allergen Reactions  . Aspirin Other (See Comments)    Abdominal pain  . Ciprofloxacin     Made pt very sick on stomach  . Clarithromycin     Made stomach burn  . Doxycycline Itching  . Erythromycin Nausea And Vomiting  . Lisinopril Cough  . Lyrica [Pregabalin] Other (See Comments)    Suicidal   . Macrobid [Nitrofurantoin Monohyd Macro] Nausea And Vomiting  . Metolazone     Pt stated this made her BP drop  . Other     Pt cannot have any of the MYCIN's  . Penicillins Swelling    Has patient had a PCN reaction causing immediate rash, facial/tongue/throat swelling, SOB or lightheadedness with hypotension:  Has patient had a PCN reaction causing severe rash involving mucus membranes or skin necrosis:  Has patient had a PCN reaction that required hospitalization  Has patient had a PCN reaction occurring within the last 10 years:  If all of the above answers are "NO", then may proceed with Cephalosporin use. Childhood allergy  . Sulfonamide Derivatives Nausea And Vomiting    Current Outpatient Prescriptions on File Prior to Visit  Medication Sig Dispense Refill  . albuterol (PROVENTIL HFA;VENTOLIN HFA) 108 (90 BASE) MCG/ACT inhaler Inhale 2 puffs into the lungs every 4 (four) hours as needed for wheezing or shortness of breath. 1 Inhaler 3  . aluminum sulfate-calcium acetate (DOMEBORO) packet Apply 1 packet topically 3 (three) times daily. 14 each 0  . buPROPion  (WELLBUTRIN XL) 150 MG 24 hr tablet Take 1 tablet (150 mg total) by mouth daily. 90 tablet 3  . cyclobenzaprine (FLEXERIL) 10 MG tablet Take 1 tablet (10 mg total) by mouth at bedtime. 30 tablet 5  . dextroamphetamine (DEXEDRINE SPANSULE) 10 MG 24 hr capsule Take 20 mg by mouth daily as needed (alertness). Reported on 02/08/2016  0  . diphenoxylate-atropine (LOMOTIL) 2.5-0.025 MG tablet Take 2 tablets by mouth 4 (four) times daily as needed for diarrhea or loose stools. 120 tablet 2  . ENSURE (ENSURE) Take 237 mLs by mouth 3 (three) times a week. Reported on 02/08/2016    . fluconazole (DIFLUCAN) 150 MG tablet Take 1 tablet (150 mg total) by mouth once. 1 tablet 6  . fluticasone (FLONASE) 50 MCG/ACT nasal spray Place 2 sprays into both nostrils daily. 16 g 1  . Fluticasone-Salmeterol (ADVAIR DISKUS) 250-50 MCG/DOSE AEPB Inhale 1 puff into the lungs 2 (two) times daily. 60 each 6  . furosemide (LASIX) 40 MG tablet Take 1 tablet (40 mg total) by mouth 2 (two) times daily. TAKE 1 IN THE MORNING AND ONE IN THE EVENING (Patient taking differently:  Take 20 mg by mouth 2 (two) times daily. ) 60 tablet 11  . gabapentin (NEURONTIN) 300 MG capsule Take 1 capsule (300 mg total) by mouth 3 (three) times daily. 90 capsule 5  . glucose blood (ACCU-CHEK AVIVA) test strip Test once per day and diagnosis code is E 11.9 100 each 1  . HYDROcodone-homatropine (HYDROMET) 5-1.5 MG/5ML syrup Take 5 mLs by mouth every 4 (four) hours as needed. 240 mL 0  . indomethacin (INDOCIN) 50 MG capsule Take 1 capsule (50 mg total) by mouth 3 (three) times daily as needed for moderate pain. 270 capsule 3  . insulin aspart protamine - aspart (NOVOLOG 70/30 MIX) (70-30) 100 UNIT/ML FlexPen Take 25 units with breakfast, 15 units with lunch and 25 units with dinner 45 mL 3  . Insulin Detemir (LEVEMIR FLEXPEN) 100 UNIT/ML Pen Inject 50 units in the AM and 100 units at bedtime, diagnosis code is E 11.9 135 mL 1  . Insulin Pen Needle (NOVOFINE)  30G X 8 MM MISC USE TO CHECK BLOOD SUGAR TWICE A DAY 180 each 3  . LORazepam (ATIVAN) 1 MG tablet Take 1 tablet (1 mg total) by mouth every 6 (six) hours as needed for anxiety. 120 tablet 2  . losartan (COZAAR) 50 MG tablet Take 1 tablet (50 mg total) by mouth daily. 90 tablet 3  . meclizine (ANTIVERT) 25 MG tablet Take 1 tablet (25 mg total) by mouth every 4 (four) hours as needed for dizziness. 300 tablet 3  . metolazone (ZAROXOLYN) 2.5 MG tablet Take 1 tablet (2.5 mg total) by mouth daily. TAKE THIS 30 MINUTES PRIOR TO YOUR MORNING DOSE OF LASIX 90 tablet 3  . nitroGLYCERIN (NITROSTAT) 0.4 MG SL tablet Place 1 tablet (0.4 mg total) under the tongue every 5 (five) minutes as needed for chest pain. 50 tablet 5  . ondansetron (ZOFRAN) 8 MG tablet Take 1 tablet (8 mg total) by mouth every 8 (eight) hours as needed for nausea or vomiting. 60 tablet 5  . OVER THE COUNTER MEDICATION Apply 1 application topically at bedtime. Reported on 04/05/2016    . oxymetazoline (AFRIN) 0.05 % nasal spray Place 1 spray into both nostrils at bedtime as needed for congestion. Reported on 02/15/2016    . phenazopyridine (PYRIDIUM) 200 MG tablet Take 1 tablet (200 mg total) by mouth 3 (three) times daily as needed for pain. Reported on 01/21/2016 90 tablet 5  . pramipexole (MIRAPEX) 1 MG tablet TAKE 6 TABLETS BY MOUTH DAILY 540 tablet 1  . scopolamine (TRANSDERM-SCOP, 1.5 MG,) 1 MG/3DAYS Place 1 patch (1.5 mg total) onto the skin every 3 (three) days. 10 patch 11  . zolpidem (AMBIEN) 10 MG tablet TAKE 1 TABLET BY MOUTH AT BEDTIME 30 tablet 5   No current facility-administered medications on file prior to visit.     BP (!) 158/64   Temp 98.6 F (37 C) (Oral)       Objective:   Physical Exam  Constitutional: She is oriented to person, place, and time. She appears well-developed and well-nourished. No distress.  Eyes: Conjunctivae and EOM are normal. Pupils are equal, round, and reactive to light. Right eye exhibits no  discharge. Left eye exhibits no discharge. No scleral icterus.  Cardiovascular: Normal rate, regular rhythm, normal heart sounds and intact distal pulses.  Exam reveals no gallop and no friction rub.   No murmur heard. Pulmonary/Chest: Effort normal and breath sounds normal. No respiratory distress. She has no wheezes. She has no rales.  She exhibits no tenderness.  Musculoskeletal: She exhibits tenderness. She exhibits no edema or deformity.  Walks with a rolling walker. Does not need any assistance going from a sitting to a standing position  Neurological: She is alert and oriented to person, place, and time.  Skin: Skin is warm and dry. No rash noted. She is not diaphoretic. No erythema. No pallor.  Bruising noted to right lower back. This bruises in various stages of healing. There is tenderness with palpation  Psychiatric: She has a normal mood and affect. Her behavior is normal. Judgment and thought content normal.  Nursing note and vitals reviewed.      Assessment & Plan:  1. Acute right-sided low back pain without sciatica - Low back pain status post fall while in the bathroom. No signs of fractures. He already has prescribed Percocet. Advised her to continue to take this medication as she normally would. - Follow-up with her PCP for further workup if indicated - Okay to use heating pad or symptom relief  Dorothyann Peng, NP

## 2016-08-16 NOTE — Telephone Encounter (Signed)
Spoke to patient and stated that she has been experiencing dizziness and headaches. Patient also said that she fell in her bathroom yesterday and that she has body aches and  a big bruise on her back. Spoke to patient about making an appointment to been seen immediately at our practice for evaluation or go to the ER which she declined,she stated that she  would better be seen at our office. Patient wanted to see her PCP Dr Sarajane Jews who has no opening. Patient is scheduled to come in and see a provider Dorothyann Peng NP this afternoon at 4.45 pm.

## 2016-08-16 NOTE — Telephone Encounter (Signed)
done

## 2016-08-16 NOTE — Telephone Encounter (Signed)
Can we triage this patient?

## 2016-08-16 NOTE — Telephone Encounter (Signed)
Patient wants a refill on her Oxycodone.  Pharmacy- [facility] on [address]

## 2016-08-16 NOTE — Telephone Encounter (Signed)
Pt states Dr Cindra Eves nurse (DM dr) advised her to call our office and report she has been dizzy, headaches, and pt fell in the bathroom yesterday. Dr Cindra Eves nurse advised pt she may need med adjustment. Advised pt to make an appointment with Dr Sarajane Jews. Pt states she cannot get in until next week.   Scheduled for 11/22.  But advised pt I would also let the dr know these things.

## 2016-08-21 ENCOUNTER — Telehealth: Payer: Self-pay | Admitting: Family Medicine

## 2016-08-21 NOTE — Telephone Encounter (Signed)
Pt need new Rx for test strips Accu chec Aviva    Pharm:  [facility] [place]

## 2016-08-22 NOTE — Telephone Encounter (Signed)
I spoke with pt and pharmacy at [facility], script was sent there in September 2017 and they do have a refill left, pt will pick up from there.

## 2016-08-23 ENCOUNTER — Ambulatory Visit: Payer: Medicare Other | Admitting: Family Medicine

## 2016-09-28 ENCOUNTER — Telehealth: Payer: Self-pay | Admitting: Family Medicine

## 2016-09-28 NOTE — Telephone Encounter (Signed)
Pt request refill  dextroamphetamine (DEXEDRINE SPANSULE) 10 MG 24 hr capsule  [facility]

## 2016-09-29 MED ORDER — DEXTROAMPHETAMINE SULFATE ER 10 MG PO CP24: 20.0000 mg | ORAL_CAPSULE | Freq: Every day | ORAL | 0 refills | Status: DC | PRN

## 2016-09-29 NOTE — Telephone Encounter (Signed)
done

## 2016-09-29 NOTE — Telephone Encounter (Signed)
Script is ready for pick up here at front office and I spoke with pt.  

## 2016-09-29 NOTE — Telephone Encounter (Signed)
Pt is aware can take up to 3 business day. Pt would like to pick up today

## 2016-10-05 ENCOUNTER — Telehealth: Payer: Self-pay

## 2016-10-05 NOTE — Telephone Encounter (Signed)
Received PA request for Dextroamphetamine 10 mg er capsules from [facility]. PA submitted via form from insurance.

## 2016-10-09 ENCOUNTER — Observation Stay (HOSPITAL_COMMUNITY)
Admission: EM | Admit: 2016-10-09 | Discharge: 2016-10-11 | Disposition: A | Discharge status: Home / Self Care | Payer: Medicare Other | Attending: Internal Medicine | Admitting: Internal Medicine

## 2016-10-09 ENCOUNTER — Emergency Department (HOSPITAL_COMMUNITY): Payer: Medicare Other

## 2016-10-09 ENCOUNTER — Encounter (HOSPITAL_COMMUNITY): Payer: Self-pay

## 2016-10-09 ENCOUNTER — Telehealth: Payer: Self-pay | Admitting: Family Medicine

## 2016-10-09 ENCOUNTER — Observation Stay (HOSPITAL_COMMUNITY): Payer: Medicare Other

## 2016-10-09 DIAGNOSIS — Z86010 Personal history of colonic polyps: Secondary | ICD-10-CM | POA: Diagnosis not present

## 2016-10-09 DIAGNOSIS — Z794 Long term (current) use of insulin: Secondary | ICD-10-CM

## 2016-10-09 DIAGNOSIS — I110 Hypertensive heart disease with heart failure: Secondary | ICD-10-CM | POA: Insufficient documentation

## 2016-10-09 DIAGNOSIS — I10 Essential (primary) hypertension: Secondary | ICD-10-CM | POA: Diagnosis not present

## 2016-10-09 DIAGNOSIS — Z8673 Personal history of transient ischemic attack (TIA), and cerebral infarction without residual deficits: Secondary | ICD-10-CM | POA: Diagnosis not present

## 2016-10-09 DIAGNOSIS — Z9181 History of falling: Secondary | ICD-10-CM | POA: Diagnosis not present

## 2016-10-09 DIAGNOSIS — Z888 Allergy status to other drugs, medicaments and biological substances status: Secondary | ICD-10-CM | POA: Insufficient documentation

## 2016-10-09 DIAGNOSIS — E1151 Type 2 diabetes mellitus with diabetic peripheral angiopathy without gangrene: Secondary | ICD-10-CM | POA: Diagnosis not present

## 2016-10-09 DIAGNOSIS — F329 Major depressive disorder, single episode, unspecified: Secondary | ICD-10-CM | POA: Insufficient documentation

## 2016-10-09 DIAGNOSIS — Z880 Allergy status to penicillin: Secondary | ICD-10-CM | POA: Insufficient documentation

## 2016-10-09 DIAGNOSIS — Z881 Allergy status to other antibiotic agents status: Secondary | ICD-10-CM | POA: Insufficient documentation

## 2016-10-09 DIAGNOSIS — R471 Dysarthria and anarthria: Secondary | ICD-10-CM | POA: Insufficient documentation

## 2016-10-09 DIAGNOSIS — E1165 Type 2 diabetes mellitus with hyperglycemia: Secondary | ICD-10-CM | POA: Insufficient documentation

## 2016-10-09 DIAGNOSIS — M1990 Unspecified osteoarthritis, unspecified site: Secondary | ICD-10-CM | POA: Diagnosis not present

## 2016-10-09 DIAGNOSIS — R2681 Unsteadiness on feet: Secondary | ICD-10-CM | POA: Diagnosis present

## 2016-10-09 DIAGNOSIS — E785 Hyperlipidemia, unspecified: Secondary | ICD-10-CM | POA: Diagnosis not present

## 2016-10-09 DIAGNOSIS — K219 Gastro-esophageal reflux disease without esophagitis: Secondary | ICD-10-CM | POA: Insufficient documentation

## 2016-10-09 DIAGNOSIS — Z882 Allergy status to sulfonamides status: Secondary | ICD-10-CM | POA: Insufficient documentation

## 2016-10-09 DIAGNOSIS — M25512 Pain in left shoulder: Secondary | ICD-10-CM

## 2016-10-09 DIAGNOSIS — G459 Transient cerebral ischemic attack, unspecified: Principal | ICD-10-CM | POA: Insufficient documentation

## 2016-10-09 DIAGNOSIS — E669 Obesity, unspecified: Secondary | ICD-10-CM | POA: Insufficient documentation

## 2016-10-09 DIAGNOSIS — Z87442 Personal history of urinary calculi: Secondary | ICD-10-CM | POA: Insufficient documentation

## 2016-10-09 DIAGNOSIS — I739 Peripheral vascular disease, unspecified: Secondary | ICD-10-CM | POA: Diagnosis not present

## 2016-10-09 DIAGNOSIS — Z886 Allergy status to analgesic agent status: Secondary | ICD-10-CM | POA: Diagnosis not present

## 2016-10-09 DIAGNOSIS — K589 Irritable bowel syndrome without diarrhea: Secondary | ICD-10-CM | POA: Insufficient documentation

## 2016-10-09 DIAGNOSIS — G458 Other transient cerebral ischemic attacks and related syndromes: Secondary | ICD-10-CM

## 2016-10-09 DIAGNOSIS — I509 Heart failure, unspecified: Secondary | ICD-10-CM | POA: Insufficient documentation

## 2016-10-09 DIAGNOSIS — I639 Cerebral infarction, unspecified: Secondary | ICD-10-CM

## 2016-10-09 DIAGNOSIS — I2510 Atherosclerotic heart disease of native coronary artery without angina pectoris: Secondary | ICD-10-CM | POA: Diagnosis not present

## 2016-10-09 DIAGNOSIS — J449 Chronic obstructive pulmonary disease, unspecified: Secondary | ICD-10-CM | POA: Insufficient documentation

## 2016-10-09 DIAGNOSIS — Z6837 Body mass index (BMI) 37.0-37.9, adult: Secondary | ICD-10-CM | POA: Insufficient documentation

## 2016-10-09 DIAGNOSIS — G4730 Sleep apnea, unspecified: Secondary | ICD-10-CM | POA: Insufficient documentation

## 2016-10-09 HISTORY — DX: Type 2 diabetes mellitus with hyperglycemia: E11.65

## 2016-10-09 HISTORY — DX: Type 2 diabetes mellitus with hyperglycemia: Z79.4

## 2016-10-09 LAB — COMPREHENSIVE METABOLIC PANEL
ALK PHOS: 88 U/L (ref 38–126)
ALT: 18 U/L (ref 14–54)
AST: 19 U/L (ref 15–41)
Albumin: 3.5 g/dL (ref 3.5–5.0)
Anion gap: 7 (ref 5–15)
BUN: 26 mg/dL — AB (ref 6–20)
CALCIUM: 9 mg/dL (ref 8.9–10.3)
CHLORIDE: 102 mmol/L (ref 101–111)
CO2: 28 mmol/L (ref 22–32)
CREATININE: 0.89 mg/dL (ref 0.44–1.00)
GFR, EST NON AFRICAN AMERICAN: 59 mL/min — AB (ref >60)
Glucose, Bld: 268 mg/dL — ABNORMAL HIGH (ref 65–99)
Potassium: 4.3 mmol/L (ref 3.5–5.1)
Sodium: 137 mmol/L (ref 135–145)
Total Bilirubin: 0.7 mg/dL (ref 0.3–1.2)
Total Protein: 7.3 g/dL (ref 6.5–8.1)

## 2016-10-09 LAB — I-STAT TROPONIN, ED: TROPONIN I, POC: 0 ng/mL (ref 0.00–0.08)

## 2016-10-09 LAB — CBC
HCT: 37.4 % (ref 36.0–46.0)
Hemoglobin: 12.3 g/dL (ref 12.0–15.0)
MCH: 29.1 pg (ref 26.0–34.0)
MCHC: 32.9 g/dL (ref 30.0–36.0)
MCV: 88.6 fL (ref 78.0–100.0)
Platelets: 214 10*3/uL (ref 150–400)
RBC: 4.22 MIL/uL (ref 3.87–5.11)
RDW: 14 % (ref 11.5–15.5)
WBC: 10.3 10*3/uL (ref 4.0–10.5)

## 2016-10-09 LAB — DIFFERENTIAL
BASOS ABS: 0 10*3/uL (ref 0.0–0.1)
Basophils Relative: 0 %
Eosinophils Absolute: 0.3 10*3/uL (ref 0.0–0.7)
Eosinophils Relative: 2 %
LYMPHS ABS: 3.1 10*3/uL (ref 0.7–4.0)
LYMPHS PCT: 30 %
MONO ABS: 0.6 10*3/uL (ref 0.1–1.0)
MONOS PCT: 6 %
NEUTROS ABS: 6.3 10*3/uL (ref 1.7–7.7)
Neutrophils Relative %: 62 %

## 2016-10-09 LAB — GLUCOSE, CAPILLARY: Glucose-Capillary: 118 mg/dL — ABNORMAL HIGH (ref 65–99)

## 2016-10-09 LAB — PROTIME-INR
INR: 1.05
Prothrombin Time: 13.7 seconds (ref 11.4–15.2)

## 2016-10-09 LAB — APTT: APTT: 26 s (ref 24–36)

## 2016-10-09 LAB — CBG MONITORING, ED: Glucose-Capillary: 247 mg/dL — ABNORMAL HIGH (ref 65–99)

## 2016-10-09 MED ORDER — LORAZEPAM 1 MG PO TABS: 1.0000 mg | ORAL_TABLET | Freq: Four times a day (QID) | ORAL | Status: DC | PRN

## 2016-10-09 MED ORDER — ACETAMINOPHEN 160 MG/5ML PO SOLN: 650.0000 mg | ORAL | Status: DC | PRN

## 2016-10-09 MED ORDER — INSULIN ASPART 100 UNIT/ML ~~LOC~~ SOLN
0.0000 [IU] | Freq: Three times a day (TID) | SUBCUTANEOUS | Status: DC
  Administered 2016-10-10: 11 [IU] via SUBCUTANEOUS
  Administered 2016-10-10 – 2016-10-11 (×5): 7 [IU] via SUBCUTANEOUS

## 2016-10-09 MED ORDER — OXYCODONE HCL 5 MG PO TABS
5.0000 mg | ORAL_TABLET | Freq: Four times a day (QID) | ORAL | Status: DC | PRN
  Administered 2016-10-09 – 2016-10-11 (×4): 5 mg via ORAL
  Filled 2016-10-09 (×4): qty 1

## 2016-10-09 MED ORDER — SODIUM CHLORIDE 0.9 % IV SOLN: INTRAVENOUS | Status: DC

## 2016-10-09 MED ORDER — GABAPENTIN 300 MG PO CAPS
300.0000 mg | ORAL_CAPSULE | Freq: Three times a day (TID) | ORAL | Status: DC | PRN
  Administered 2016-10-09: 300 mg via ORAL
  Filled 2016-10-09: qty 1

## 2016-10-09 MED ORDER — INSULIN ASPART 100 UNIT/ML ~~LOC~~ SOLN
4.0000 [IU] | Freq: Three times a day (TID) | SUBCUTANEOUS | Status: DC
  Administered 2016-10-10 (×3): 4 [IU] via SUBCUTANEOUS

## 2016-10-09 MED ORDER — SENNOSIDES-DOCUSATE SODIUM 8.6-50 MG PO TABS: 1.0000 | ORAL_TABLET | Freq: Every evening | ORAL | Status: DC | PRN

## 2016-10-09 MED ORDER — FUROSEMIDE 40 MG PO TABS
40.0000 mg | ORAL_TABLET | Freq: Two times a day (BID) | ORAL | Status: DC
  Filled 2016-10-09: qty 1

## 2016-10-09 MED ORDER — FLUTICASONE PROPIONATE 50 MCG/ACT NA SUSP: 2.0000 | Freq: Every day | NASAL | Status: DC | PRN

## 2016-10-09 MED ORDER — FUROSEMIDE 40 MG PO TABS
40.0000 mg | ORAL_TABLET | Freq: Two times a day (BID) | ORAL | Status: DC
  Administered 2016-10-10 – 2016-10-11 (×3): 40 mg via ORAL
  Filled 2016-10-09 (×3): qty 1

## 2016-10-09 MED ORDER — STROKE: EARLY STAGES OF RECOVERY BOOK
Freq: Once | Status: AC
  Administered 2016-10-10: 06:00:00

## 2016-10-09 MED ORDER — ZOLPIDEM TARTRATE 5 MG PO TABS
5.0000 mg | ORAL_TABLET | Freq: Every evening | ORAL | Status: DC | PRN
  Administered 2016-10-09 – 2016-10-10 (×2): 5 mg via ORAL
  Filled 2016-10-09 (×2): qty 1

## 2016-10-09 MED ORDER — ACETAMINOPHEN 325 MG PO TABS: 650.0000 mg | ORAL_TABLET | ORAL | Status: DC | PRN

## 2016-10-09 MED ORDER — ASPIRIN EC 81 MG PO TBEC
324.0000 mg | DELAYED_RELEASE_TABLET | Freq: Every day | ORAL | Status: DC
  Administered 2016-10-09: 324 mg via ORAL
  Filled 2016-10-09: qty 4

## 2016-10-09 MED ORDER — ASPIRIN 325 MG PO TABS
325.0000 mg | ORAL_TABLET | Freq: Every day | ORAL | Status: DC
  Filled 2016-10-09: qty 1

## 2016-10-09 MED ORDER — OXYCODONE-ACETAMINOPHEN 10-325 MG PO TABS: 1.0000 | ORAL_TABLET | Freq: Four times a day (QID) | ORAL | Status: DC | PRN

## 2016-10-09 MED ORDER — ENOXAPARIN SODIUM 40 MG/0.4ML ~~LOC~~ SOLN
40.0000 mg | SUBCUTANEOUS | Status: DC
  Administered 2016-10-09 – 2016-10-10 (×2): 40 mg via SUBCUTANEOUS
  Filled 2016-10-09 (×2): qty 0.4

## 2016-10-09 MED ORDER — ONDANSETRON HCL 4 MG/2ML IJ SOLN: 4.0000 mg | Freq: Four times a day (QID) | INTRAMUSCULAR | Status: DC | PRN

## 2016-10-09 MED ORDER — CYCLOBENZAPRINE HCL 10 MG PO TABS
10.0000 mg | ORAL_TABLET | Freq: Every evening | ORAL | Status: DC | PRN
  Administered 2016-10-11: 10 mg via ORAL
  Filled 2016-10-09 (×2): qty 1

## 2016-10-09 MED ORDER — INSULIN ASPART 100 UNIT/ML ~~LOC~~ SOLN
0.0000 [IU] | Freq: Every day | SUBCUTANEOUS | Status: DC
  Administered 2016-10-10: 4 [IU] via SUBCUTANEOUS

## 2016-10-09 MED ORDER — PRAMIPEXOLE DIHYDROCHLORIDE 1 MG PO TABS
5.0000 mg | ORAL_TABLET | Freq: Once | ORAL | Status: AC
  Administered 2016-10-09: 5 mg via ORAL
  Filled 2016-10-09: qty 5

## 2016-10-09 MED ORDER — ACETAMINOPHEN 650 MG RE SUPP: 650.0000 mg | RECTAL | Status: DC | PRN

## 2016-10-09 MED ORDER — ASPIRIN 300 MG RE SUPP
300.0000 mg | Freq: Every day | RECTAL | Status: DC
  Filled 2016-10-09: qty 1

## 2016-10-09 MED ORDER — PRAMIPEXOLE DIHYDROCHLORIDE 1 MG PO TABS: 6.0000 mg | ORAL_TABLET | Freq: Every day | ORAL | Status: DC

## 2016-10-09 MED ORDER — OXYCODONE-ACETAMINOPHEN 5-325 MG PO TABS
1.0000 | ORAL_TABLET | Freq: Four times a day (QID) | ORAL | Status: DC | PRN
  Administered 2016-10-10 – 2016-10-11 (×4): 1 via ORAL
  Filled 2016-10-09 (×4): qty 1

## 2016-10-09 NOTE — ED Notes (Signed)
[facility] 10 minutes out.

## 2016-10-09 NOTE — Telephone Encounter (Signed)
Pt has checked in to [facility] ED. Nothing further needed at this time.

## 2016-10-09 NOTE — ED Notes (Signed)
Tat at bedside.

## 2016-10-09 NOTE — ED Triage Notes (Signed)
Pt c/o expressive aphasia x "less than 30 minutes" x 2 days ago and L forearm pain radiating up into L shoulder x 3 weeks.  Pain score 7/10 increasing w/ movement.  Pt's son reports "this is her 4th episode in a year."  Hx of TIA.

## 2016-10-09 NOTE — ED Provider Notes (Signed)
[facility] DEPT Provider Note   CSN: [id number] Arrival date & time: 10/09/16  1022     History   Chief Complaint Chief Complaint  Patient presents with  . Expressive Aphasia  . Arm Pain    HPI Melanie Mcdonald is a 83 y.o. female.  She is here for evaluation of an episode of "talking gibberish", which occurred while on the telephone with someone, 3 days ago. At that time, the person she was talking to call EMS and they came to the patient's home. The patient was able to talk to them and decided not to come to the emergency department. Today, the patient's son came to her home, and was concerned, so encouraged her to come here. She had called her PCP earlier and they were unable to see her today. She states that she has had similar episodes in the past, but not had them checked out. She has been varying her usual medications, because she was worried that they were not helping her. She is not taking losartan because she did not know what it was for. She denies recent fever, chills, cough, shortness of breath, chest pain, weakness or dizziness. She is concerned that she has been generally less active and able to do the things that she wants, such as working outside, for the last several years. There are no other known modifying factors.  HPI  Past Medical History:  Diagnosis Date  . ANEMIA 01/30/2008  . ARTHRITIS 12/11/2007  . BACK PAIN, CHRONIC 06/23/2008  . BREAST CYST, RIGHT 12/17/2007  . Colon polyps    FRAGMENTS OF HYPERPLASTIC POLYP  . CONTACT DERMATITIS 03/10/2009  . COPD (chronic obstructive pulmonary disease) ([place])   . CORONARY ARTERY DISEASE 03/01/2007   had a normal Myoview stress test 07-13-11  . CYSTOCELE WITHOUT MENTION UTERINE PROLAPSE LAT 09/12/2010  . DEGENERATIVE JOINT DISEASE 08/07/2007  . DEPRESSION 03/01/2007  . DIABETES MELLITUS, TYPE II 08/26/2008   sees Dr. Dwyane Dee   . Diverticulosis of colon (without mention of hemorrhage)   . Esophageal reflux 11/06/2007  .  HYPERKERATOSIS 06/02/2009  . HYPERLIPIDEMIA 08/26/2008  . HYPERTENSION 03/01/2007  . Irritable bowel syndrome 01/26/2009  . LABYRINTHITIS 11/03/2009  . 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 NEPHROLITHIASIS 04/29/2009  . OBESITY 03/01/2007  . PERIPHERAL VASCULAR DISEASE 01/26/2009  . SYNCOPE 08/26/2008   had brain MRI on 06-28-12 showing only chronic microvascular ischemia and atrophy   . TRANSIENT ISCHEMIC ATTACK 10/20/2009   had normal brain MRA with patent vertebrals and carotids 06-28-12    Patient Active Problem List   Diagnosis Date Noted  . Cellulitis of right foot 06/13/2016  . Gout 06/07/2016  . Leg swelling 04/05/2016  . Pain in joint, ankle and foot 12/24/2015  . Diabetic polyneuropathy associated with diabetes mellitus due to underlying condition ([place]) 06/14/2015  . Cervicogenic headache 06/14/2015  . Weakness generalized 11/13/2014  . TIA (transient ischemic attack) 11/13/2014  . Gait instability 11/13/2014  . Acute on chronic diastolic CHF (congestive heart failure), NYHA class 3 ([place]) 11/03/2014  . Obstructive apnea 11/03/2014  . DOE (dyspnea on exertion) 11/03/2014  . Diabetes mellitus without complication ([place]) 123XX123  . Thyroid nodule 04/28/2014  . Personal history of colonic polyps 03/09/2014  . Abdominal pain, unspecified site 03/09/2014  . Dizziness 02/10/2014  . Memory loss 12/30/2013  . Headache 12/30/2013  . Abdominal pain, chronic, right lower quadrant 06/03/2013  . Hypokalemia 08/15/2011  . Diarrhea 08/15/2011  . Cellulitis of left leg 08/14/2011  . Chest  pain 06/08/2011  . HIP PAIN 09/15/2010  . CYSTOCELE WITHOUT MENTION UTERINE PROLAPSE LAT 09/12/2010  . Labyrinthitis 11/03/2009  . TRANSIENT ISCHEMIC ATTACK 10/20/2009  . NEPHROLITHIASIS 04/29/2009  . PERIPHERAL VASCULAR DISEASE 01/26/2009  . IRRITABLE BOWEL SYNDROME 01/26/2009  . Edema 09/02/2008  . Type 2 diabetes mellitus ([place]) 08/26/2008  . Hyperlipidemia 08/26/2008  . NARCOLEPSY  WITHOUT CATAPLEXY 08/26/2008  . HYPOGLYCEMIA 06/23/2008  . Backache 06/23/2008  . Arthropathy 12/11/2007  . Insomnia 12/11/2007  . Esophageal reflux 11/06/2007  . Osteoarthritis 08/07/2007  . OBESITY 03/01/2007  . DEPRESSION 03/01/2007  . SYNDROME, RESTLESS LEGS 03/01/2007  . Essential hypertension 03/01/2007  . Coronary atherosclerosis 03/01/2007    Past Surgical History:  Procedure Laterality Date  . ABDOMINAL HYSTERECTOMY    . BACK SURGERY     x2  . CARDIAC CATHETERIZATION  07/2009  . CATARACT EXTRACTION    . COLONOSCOPY  11-13-07   per Dr. Deatra Ina, benign polyps, repeat in 5 yrs  . ESOPHAGOGASTRODUODENOSCOPY  11-13-07   per Dr. Deatra Ina, normal   . INTRAOCULAR LENS INSERTION    . SPINE SURGERY     x 3  . TONSILECTOMY, ADENOIDECTOMY, BILATERAL MYRINGOTOMY AND TUBES      OB History    No data available       Home Medications    Prior to Admission medications   Medication Sig Start Date End Date Taking? Authorizing Provider  albuterol (PROVENTIL HFA;VENTOLIN HFA) 108 (90 BASE) MCG/ACT inhaler Inhale 2 puffs into the lungs every 4 (four) hours as needed for wheezing or shortness of breath. 04/01/14  Yes Dorothy Spark, MD  buPROPion (WELLBUTRIN XL) 150 MG 24 hr tablet Take 1 tablet (150 mg total) by mouth daily. Patient taking differently: Take 150 mg by mouth daily as needed (depression).  08/24/15  Yes Laurey Morale, MD  cyclobenzaprine (FLEXERIL) 10 MG tablet Take 1 tablet (10 mg total) by mouth at bedtime. Patient taking differently: Take 10 mg by mouth at bedtime as needed for muscle spasms.  06/14/15  Yes Cameron Sprang, MD  dextroamphetamine (DEXEDRINE SPANSULE) 10 MG 24 hr capsule Take 2 capsules (20 mg total) by mouth daily as needed (alertness). Reported on 02/08/2016 09/29/16  Yes Laurey Morale, MD  ENSURE (ENSURE) Take 237 mLs by mouth 2 (two) times daily as needed (poor appetite). Reported on 02/08/2016   Yes Historical Provider, MD  fluticasone (FLONASE) 50  MCG/ACT nasal spray Place 2 sprays into both nostrils daily. Patient taking differently: Place 2 sprays into both nostrils daily as needed for allergies.  12/30/14  Yes Kennyth Arnold, FNP  furosemide (LASIX) 40 MG tablet Take 1 tablet (40 mg total) by mouth 2 (two) times daily. TAKE 1 IN THE MORNING AND ONE IN THE EVENING Patient taking differently: Take 40 mg by mouth 2 (two) times daily.  11/29/15  Yes Laurey Morale, MD  gabapentin (NEURONTIN) 300 MG capsule Take 1 capsule (300 mg total) by mouth 3 (three) times daily. Patient taking differently: Take 300 mg by mouth 3 (three) times daily as needed (nerve pain).  06/13/16  Yes Laurey Morale, MD  glucose blood (ACCU-CHEK AVIVA) test strip Test once per day and diagnosis code is E 11.9 06/28/16  Yes Laurey Morale, MD  Insulin Pen Needle (NOVOFINE) 30G X 8 MM MISC USE TO CHECK BLOOD SUGAR TWICE A DAY 06/15/16  Yes Laurey Morale, MD  insulin regular human CONCENTRATED (HUMULIN R U-500 KWIKPEN) 500 UNIT/ML kwikpen Inject  50-60 Units into the skin 3 (three) times daily with meals. Take 60units at breakfast 50 at lunch and 50 at supper   Yes Historical Provider, MD  ketoconazole (NIZORAL) 2 % cream Apply 1 application topically 2 (two) times daily as needed for irritation (between bikini lines).   Yes Historical Provider, MD  LORazepam (ATIVAN) 1 MG tablet Take 1 tablet (1 mg total) by mouth every 6 (six) hours as needed for anxiety. 02/15/16  Yes Laurey Morale, MD  meclizine (ANTIVERT) 25 MG tablet Take 1 tablet (25 mg total) by mouth every 4 (four) hours as needed for dizziness. 05/14/14  Yes Laurey Morale, MD  nitroGLYCERIN (NITROSTAT) 0.4 MG SL tablet Place 1 tablet (0.4 mg total) under the tongue every 5 (five) minutes as needed for chest pain. 01/21/16  Yes Laurey Morale, MD  ondansetron (ZOFRAN) 8 MG tablet Take 1 tablet (8 mg total) by mouth every 8 (eight) hours as needed for nausea or vomiting. 06/13/16  Yes Laurey Morale, MD  oxyCODONE-acetaminophen  (PERCOCET) 10-325 MG tablet Take 1 tablet by mouth every 6 (six) hours as needed for pain. 08/16/16  Yes Laurey Morale, MD  pramipexole (MIRAPEX) 1 MG tablet TAKE 6 TABLETS BY MOUTH DAILY Patient taking differently: TAKE 5mg =5 TABLETS BY MOUTH daily at bedtime 07/13/16  Yes Laurey Morale, MD  scopolamine (TRANSDERM-SCOP, 1.5 MG,) 1 MG/3DAYS Place 1 patch (1.5 mg total) onto the skin every 3 (three) days. Patient taking differently: Place 1 patch onto the skin every three (3) days as needed (nausea).  06/13/16  Yes Laurey Morale, MD  zolpidem (AMBIEN) 10 MG tablet TAKE 1 TABLET BY MOUTH AT BEDTIME Patient taking differently: TAKE 10mg  TABLET BY MOUTH AT BEDTIME 06/13/16  Yes Laurey Morale, MD  Fluticasone-Salmeterol (ADVAIR DISKUS) 250-50 MCG/DOSE AEPB Inhale 1 puff into the lungs 2 (two) times daily. Patient not taking: Reported on 10/09/2016 04/01/14   Dorothy Spark, MD  HYDROcodone-homatropine (HYDROMET) 5-1.5 MG/5ML syrup Take 5 mLs by mouth every 4 (four) hours as needed. Patient not taking: Reported on 10/09/2016 12/24/15   Laurey Morale, MD  indomethacin (INDOCIN) 50 MG capsule Take 1 capsule (50 mg total) by mouth 3 (three) times daily as needed for moderate pain. Patient not taking: Reported on 10/09/2016 06/28/16   Laurey Morale, MD  insulin aspart protamine - aspart (NOVOLOG 70/30 MIX) (70-30) 100 UNIT/ML FlexPen Take 25 units with breakfast, 15 units with lunch and 25 units with dinner Patient not taking: Reported on 10/09/2016 06/07/16   Laurey Morale, MD  Insulin Detemir (LEVEMIR FLEXPEN) 100 UNIT/ML Pen Inject 50 units in the AM and 100 units at bedtime, diagnosis code is E 11.9 Patient not taking: Reported on 10/09/2016 06/09/16   Laurey Morale, MD  losartan (COZAAR) 50 MG tablet Take 1 tablet (50 mg total) by mouth daily. Patient not taking: Reported on 10/09/2016 06/28/16   Laurey Morale, MD  metolazone (ZAROXOLYN) 2.5 MG tablet Take 1 tablet (2.5 mg total) by mouth daily. TAKE THIS 30 MINUTES PRIOR  TO YOUR MORNING DOSE OF LASIX Patient not taking: Reported on 10/09/2016 11/03/14   Dorothy Spark, MD  phenazopyridine (PYRIDIUM) 200 MG tablet Take 1 tablet (200 mg total) by mouth 3 (three) times daily as needed for pain. Reported on 01/21/2016 01/21/16   Laurey Morale, MD    Family History Family History  Problem Relation Age of Onset  . Lupus Mother   .  COPD Father     Social History Social History  Substance Use Topics  . Smoking status: Never Smoker  . Smokeless tobacco: Never Used  . Alcohol use No     Allergies   Aspirin; Ciprofloxacin; Clarithromycin; Doxycycline; Erythromycin; Lisinopril; Lyrica [pregabalin]; Macrobid [nitrofurantoin monohyd macro]; Metolazone; Other; Penicillins; and Sulfonamide derivatives   Review of Systems Review of Systems  All other systems reviewed and are negative.    Physical Exam Updated Vital Signs BP 144/71 (BP Location: Right Arm)   Pulse 75   Temp 98.2 F (36.8 C)   Resp 18   Ht 5\' 7"  (1.702 m)   Wt 240 lb (108.9 kg)   SpO2 95%   BMI 37.59 kg/m   Physical Exam  Constitutional: She is oriented to person, place, and time. She appears well-developed.  Elderly, frail  HENT:  Head: Normocephalic and atraumatic.  Eyes: Conjunctivae and EOM are normal. Pupils are equal, round, and reactive to light.  Neck: Normal range of motion and phonation normal. Neck supple.  Cardiovascular: Normal rate and regular rhythm.   Pulmonary/Chest: Effort normal and breath sounds normal. She exhibits no tenderness.  Abdominal: Soft. She exhibits no distension. There is no tenderness. There is no guarding.  Musculoskeletal: Normal range of motion.  Neurological: She is alert and oriented to person, place, and time. She exhibits normal muscle tone.  No dysarthria and aphasia or nystagmus.  Skin: Skin is warm and dry.  Psychiatric: Her behavior is normal.  She is somewhat sad, and fearful.  Nursing note and vitals reviewed.    ED Treatments /  Results  Labs (all labs ordered are listed, but only abnormal results are displayed) Labs Reviewed  COMPREHENSIVE METABOLIC PANEL - Abnormal; Notable for the following:       Result Value   Glucose, Bld 268 (*)    BUN 26 (*)    GFR calc non Af Amer 59 (*)    All other components within normal limits  CBG MONITORING, ED - Abnormal; Notable for the following:    Glucose-Capillary 247 (*)    All other components within normal limits  PROTIME-INR  APTT  CBC  DIFFERENTIAL  I-STAT TROPOININ, ED    EKG  EKG Interpretation None       Radiology Dg Chest 2 View  Result Date: 10/09/2016 CLINICAL DATA:  TIA with a aphasia. EXAM: CHEST  2 VIEW COMPARISON:  11/10/2014 FINDINGS: The heart size and mediastinal contours are within normal limits. There is no evidence of pulmonary edema, consolidation, pneumothorax, nodule or pleural fluid. The thoracic spine shows stable degenerative disc disease. IMPRESSION: No active cardiopulmonary disease. Electronically Signed   By: Aletta Edouard M.D.   On: 10/09/2016 11:19   Ct Head Wo Contrast  Result Date: 10/09/2016 CLINICAL DATA:  Aphasia 2 days ago, generalized lower extremity weakness and left wrist pain, neck pain. EXAM: CT HEAD WITHOUT CONTRAST TECHNIQUE: Contiguous axial images were obtained from the base of the skull through the vertex without intravenous contrast. COMPARISON:  08/25/2015. FINDINGS: Brain: No evidence of an acute infarct, acute hemorrhage, mass lesion, mass effect or hydrocephalus. Atrophy. Mild periventricular low attenuation. Vascular: No hyperdense vessel or unexpected calcification. Skull: Normal. Negative for fracture or focal lesion. Sinuses/Orbits: No acute finding. Other: None. IMPRESSION: 1. No acute intracranial abnormality. 2. Atrophy and chronic microvascular white matter ischemic changes. Electronically Signed   By: Lorin Picket M.D.   On: 10/09/2016 11:23    Procedures Procedures (including critical care  time)  Medications Ordered in ED Medications - No data to display   Initial Impression / Assessment and Plan / ED Course  I have reviewed the triage vital signs and the nursing notes.  Pertinent labs & imaging results that were available during my care of the patient were reviewed by me and considered in my medical decision making (see chart for details).  Clinical Course     Medications - No data to display  Patient Vitals for the past 24 hrs:  BP Temp Temp src Pulse Resp SpO2 Height Weight  10/09/16 1358 144/71 - - 75 18 95 % - -  10/09/16 1330 140/60 - - 75 14 92 % - -  10/09/16 1300 152/71 - - 70 16 94 % - -  10/09/16 1230 158/91 - - 85 15 98 % - -  10/09/16 1203 155/75 - - 89 18 98 % - -  10/09/16 1134 140/66 - - 86 17 99 % - -  10/09/16 1100 (!) 126/50 - - 88 14 94 % - -  10/09/16 1045 - 98.2 F (36.8 C) - - - - - -  10/09/16 1038 - - - - - - 5\' 7"  (1.702 m) 240 lb (108.9 kg)  10/09/16 1037 (!) 156/54 98.2 F (36.8 C) Oral 90 18 94 % - -    1:38 PM Reevaluation with update and discussion. After initial assessment and treatment, an updated evaluation reveals No change in clinical status at this time. I discussed the findings of a transient episode of speech problem, as most likely related to TIA. Patient prefers to stay for evaluation. Srihitha Tagliaferri L    1:43 PM-Consult complete with Dr. Carles Collet. Patient case explained and discussed. He agrees to admit patient for further evaluation and treatment. Call ended at 13:53   Final Clinical Impressions(s) / ED Diagnoses   Final diagnoses:  Transient cerebral ischemia, unspecified type   Subacute TIA symptoms, requiring further evaluation. Mild hypertension, partially treated with intermittent use of losartan. Doubt CVA, serious bacterial infection or metabolic instability.  Nursing Notes Reviewed/ Care Coordinated, and agree without changes. Applicable Imaging Reviewed.  Interpretation of Laboratory Data incorporated into ED  treatment  Plan: Admit  New Prescriptions New Prescriptions   No medications on file     Daleen Bo, MD 10/09/16 1658

## 2016-10-09 NOTE — Telephone Encounter (Signed)
Waiting on pt to check in to ED.

## 2016-10-09 NOTE — ED Notes (Signed)
[facility] en route.  °

## 2016-10-09 NOTE — Telephone Encounter (Signed)
PA not needed. Claim processed by pharmacy with paid claim.

## 2016-10-09 NOTE — ED Notes (Signed)
Pt transported to DG.  

## 2016-10-09 NOTE — ED Notes (Signed)
With assessment pt verbalizes aphasia 2 days ago lasting 30 minutes. Pt reports only generalized lower extremity weakness and left wrist pain radiating to neck for 3 weeks. Pt neurological exam unremarkable.

## 2016-10-09 NOTE — Telephone Encounter (Signed)
[facility] - Client [place] [facility]  Patient Name: Melanie Mcdonald  DOB: [birth year]    Initial Comment Caller states c/o slurred speech since Saturday and speaking slower since.   Nurse Assessment  Nurse: Wynetta Emery, RN, Baker Janus Date/Time Eilene Ghazi Time): 10/09/2016 8:27:23 AM  Confirm and document reason for call. If symptomatic, describe symptoms. ---[name] was talking on the phone Saturday and felt she had a TIA episode and had a neck pain 170/90 HR 90 -- feels she is talking slower than usual and has a headache.  Does the patient have any new or worsening symptoms? ---Yes  Will a triage be completed? ---Yes  Related visit to physician within the last 2 weeks? ---No  Does the PT have any chronic conditions? (i.e. diabetes, asthma, etc.) ---Unknown  Is this a behavioral health or substance abuse call? ---No     Guidelines    Guideline Title Affirmed Question Affirmed Notes  Neurologic Deficit Headache (and neurologic deficit)    Final Disposition User   Go to ED Now Wynetta Emery, RN, Baker Janus    Referrals  [facility] - ED   Disagree/Comply: Comply

## 2016-10-09 NOTE — ED Notes (Signed)
[facility] at bedside 

## 2016-10-09 NOTE — ED Notes (Signed)
Pt used [facility] with stand by assist

## 2016-10-09 NOTE — H&P (Signed)
History and Physical  Melanie Mcdonald [id number] DOB: Dec 25, 1932 DOA: 10/09/2016   PCP: Alysia Penna, MD   Patient coming from: Home  Chief Complaint:   HPI:  Melanie Mcdonald is a 83 y.o. female with medical history of hypertension, diabetes mellitus, GERD, COPD, hyperlipidemia presented with 2 day history of dysarthria. The patient's son states that around 17 AM on 10/07/2016, he noticed some dysarthria when he called her on the telephone. He was not home at that time, so he contacted a friend to check up on the patient. The patient still has dysarthria, and EMS was activated. However, the patient refused to go to the emergency department for evaluation. Over the next 24 hours, the patient's dysarthria slightly improved. On 10/09/2016, the patient tried to call for an appointment at her primary care provider's office, but there were no appointments available. Finally, the patient relented and agreed to come to the emergency department for further evaluation.  In the emergency department, the patient states that her dysarthria has improved, but she still feels like she is speaking "slow". The patient denied any visual loss, headache, focal extremity weakness, or dysesthesias. Patient denies fevers, chills,chest pain, dyspnea, nausea, vomiting, diarrhea, abdominal pain, dysuria, hematuria, hematochezia, and melena.  In the emergency department, the patient was afebrile and hemodynamically stable saturating well on room air. BMP and CBC were unremarkable. Hepatic enzymes were unremarkable. Chest x-ray and CT of the brain were negative. Subsequently, the patient agreed to stay in the hospital for further evaluation. EKG showed sinus rhythm with nonspecific ST changes.  Assessment/Plan: Dysarthria/TIA -Concern about ischemic stroke -Neurology consulted-->transfer to [facility] for full stroke workup -PT/OT evaluation -Speech therapy eval -CT brain--neg -MRI brain--pending -MRA  brain--pending -Carotid Duplex--pending -Echo--pending -LDL--pending -HbA1C-pending -Antiplatelet--ASA 325 mg daily -d/c dexedrine  Hypertension -The patient was not taking her losartan as directed -Follow for permissive hypertension -hydralazine prn SBP > 220  Diabetes mellitus type 2 -Hemoglobin A1c -pt no longer taking levemir-takes Humulin U-500 -resistant sliding scale for now -novolog 4 units with meals  Lower extremity edema -Venous duplex -Urine protein creatinine ratio  Gait instability -Patient had a mechanical fall on 10/07/2011 -PT evaluation -Minimize hypnotic medications -X-ray left shoulder -UA and urine culture -UDS         Past Medical History:  Diagnosis Date  . ANEMIA 01/30/2008  . ARTHRITIS 12/11/2007  . BACK PAIN, CHRONIC 06/23/2008  . BREAST CYST, RIGHT 12/17/2007  . Colon polyps    FRAGMENTS OF HYPERPLASTIC POLYP  . CONTACT DERMATITIS 03/10/2009  . COPD (chronic obstructive pulmonary disease) ([place])   . CORONARY ARTERY DISEASE 03/01/2007   had a normal Myoview stress test 07-13-11  . CYSTOCELE WITHOUT MENTION UTERINE PROLAPSE LAT 09/12/2010  . DEGENERATIVE JOINT DISEASE 08/07/2007  . DEPRESSION 03/01/2007  . DIABETES MELLITUS, TYPE II 08/26/2008   sees Dr. Dwyane Dee   . Diverticulosis of colon (without mention of hemorrhage)   . Esophageal reflux 11/06/2007  . HYPERKERATOSIS 06/02/2009  . HYPERLIPIDEMIA 08/26/2008  . HYPERTENSION 03/01/2007  . Irritable bowel syndrome 01/26/2009  . LABYRINTHITIS 11/03/2009  . 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 NEPHROLITHIASIS 04/29/2009  . OBESITY 03/01/2007  . PERIPHERAL VASCULAR DISEASE 01/26/2009  . SYNCOPE 08/26/2008   had brain MRI on 06-28-12 showing only chronic microvascular ischemia and atrophy   . TRANSIENT ISCHEMIC ATTACK 10/20/2009   had normal brain MRA with patent vertebrals and carotids 06-28-12   Past Surgical History:  Procedure Laterality Date  . ABDOMINAL HYSTERECTOMY    .  BACK  SURGERY     x2  . CARDIAC CATHETERIZATION  07/2009  . CATARACT EXTRACTION    . COLONOSCOPY  11-13-07   per Dr. Deatra Ina, benign polyps, repeat in 5 yrs  . ESOPHAGOGASTRODUODENOSCOPY  11-13-07   per Dr. Deatra Ina, normal   . INTRAOCULAR LENS INSERTION    . SPINE SURGERY     x 3  . TONSILECTOMY, ADENOIDECTOMY, BILATERAL MYRINGOTOMY AND TUBES     Social History:  reports that she has never smoked. She has never used smokeless tobacco. She reports that she does not drink alcohol or use drugs.   Family History  Problem Relation Age of Onset  . Lupus Mother   . COPD Father      Allergies  Allergen Reactions  . Aspirin Other (See Comments)    Abdominal pain  . Ciprofloxacin Nausea And Vomiting    Made pt very sick on stomach  . Clarithromycin     Made stomach burn  . Doxycycline Itching  . Erythromycin Nausea And Vomiting  . Lisinopril Cough  . Lyrica [Pregabalin] Other (See Comments)    Suicidal   . Macrobid [Nitrofurantoin Monohyd Macro] Nausea And Vomiting  . Metolazone Other (See Comments)    Pt stated this made her BP drop  . Other     Pt cannot have any of the MYCIN's  . Penicillins Swelling    Has patient had a PCN reaction causing immediate rash, facial/tongue/throat swelling, SOB or lightheadedness with hypotension:  Has patient had a PCN reaction causing severe rash involving mucus membranes or skin necrosis:  Has patient had a PCN reaction that required hospitalization  Has patient had a PCN reaction occurring within the last 10 years:  If all of the above answers are "NO", then may proceed with Cephalosporin use. Childhood allergy  . Sulfonamide Derivatives Nausea And Vomiting     Prior to Admission medications   Medication Sig Start Date End Date Taking? Authorizing Provider  albuterol (PROVENTIL HFA;VENTOLIN HFA) 108 (90 BASE) MCG/ACT inhaler Inhale 2 puffs into the lungs every 4 (four) hours as needed for wheezing or shortness of breath. 04/01/14  Yes Dorothy Spark, MD  buPROPion (WELLBUTRIN XL) 150 MG 24 hr tablet Take 1 tablet (150 mg total) by mouth daily. Patient taking differently: Take 150 mg by mouth daily as needed (depression).  08/24/15  Yes Laurey Morale, MD  cyclobenzaprine (FLEXERIL) 10 MG tablet Take 1 tablet (10 mg total) by mouth at bedtime. Patient taking differently: Take 10 mg by mouth at bedtime as needed for muscle spasms.  06/14/15  Yes Cameron Sprang, MD  dextroamphetamine (DEXEDRINE SPANSULE) 10 MG 24 hr capsule Take 2 capsules (20 mg total) by mouth daily as needed (alertness). Reported on 02/08/2016 09/29/16  Yes Laurey Morale, MD  ENSURE (ENSURE) Take 237 mLs by mouth 2 (two) times daily as needed (poor appetite). Reported on 02/08/2016   Yes Historical Provider, MD  fluticasone (FLONASE) 50 MCG/ACT nasal spray Place 2 sprays into both nostrils daily. Patient taking differently: Place 2 sprays into both nostrils daily as needed for allergies.  12/30/14  Yes Kennyth Arnold, FNP  furosemide (LASIX) 40 MG tablet Take 1 tablet (40 mg total) by mouth 2 (two) times daily. TAKE 1 IN THE MORNING AND ONE IN THE EVENING Patient taking differently: Take 40 mg by mouth 2 (two) times daily.  11/29/15  Yes Laurey Morale, MD  gabapentin (NEURONTIN) 300 MG capsule Take 1 capsule (300  mg total) by mouth 3 (three) times daily. Patient taking differently: Take 300 mg by mouth 3 (three) times daily as needed (nerve pain).  06/13/16  Yes Laurey Morale, MD  glucose blood (ACCU-CHEK AVIVA) test strip Test once per day and diagnosis code is E 11.9 06/28/16  Yes Laurey Morale, MD  Insulin Pen Needle (NOVOFINE) 30G X 8 MM MISC USE TO CHECK BLOOD SUGAR TWICE A DAY 06/15/16  Yes Laurey Morale, MD  insulin regular human CONCENTRATED (HUMULIN R U-500 KWIKPEN) 500 UNIT/ML kwikpen Inject 50-60 Units into the skin 3 (three) times daily with meals. Take 60units at breakfast 50 at lunch and 50 at supper   Yes Historical Provider, MD  ketoconazole (NIZORAL) 2 % cream Apply  1 application topically 2 (two) times daily as needed for irritation (between bikini lines).   Yes Historical Provider, MD  LORazepam (ATIVAN) 1 MG tablet Take 1 tablet (1 mg total) by mouth every 6 (six) hours as needed for anxiety. 02/15/16  Yes Laurey Morale, MD  meclizine (ANTIVERT) 25 MG tablet Take 1 tablet (25 mg total) by mouth every 4 (four) hours as needed for dizziness. 05/14/14  Yes Laurey Morale, MD  nitroGLYCERIN (NITROSTAT) 0.4 MG SL tablet Place 1 tablet (0.4 mg total) under the tongue every 5 (five) minutes as needed for chest pain. 01/21/16  Yes Laurey Morale, MD  ondansetron (ZOFRAN) 8 MG tablet Take 1 tablet (8 mg total) by mouth every 8 (eight) hours as needed for nausea or vomiting. 06/13/16  Yes Laurey Morale, MD  oxyCODONE-acetaminophen (PERCOCET) 10-325 MG tablet Take 1 tablet by mouth every 6 (six) hours as needed for pain. 08/16/16  Yes Laurey Morale, MD  pramipexole (MIRAPEX) 1 MG tablet TAKE 6 TABLETS BY MOUTH DAILY Patient taking differently: TAKE 5mg =5 TABLETS BY MOUTH daily at bedtime 07/13/16  Yes Laurey Morale, MD  scopolamine (TRANSDERM-SCOP, 1.5 MG,) 1 MG/3DAYS Place 1 patch (1.5 mg total) onto the skin every 3 (three) days. Patient taking differently: Place 1 patch onto the skin every three (3) days as needed (nausea).  06/13/16  Yes Laurey Morale, MD  zolpidem (AMBIEN) 10 MG tablet TAKE 1 TABLET BY MOUTH AT BEDTIME Patient taking differently: TAKE 10mg  TABLET BY MOUTH AT BEDTIME 06/13/16  Yes Laurey Morale, MD  Fluticasone-Salmeterol (ADVAIR DISKUS) 250-50 MCG/DOSE AEPB Inhale 1 puff into the lungs 2 (two) times daily. Patient not taking: Reported on 10/09/2016 04/01/14   Dorothy Spark, MD  HYDROcodone-homatropine (HYDROMET) 5-1.5 MG/5ML syrup Take 5 mLs by mouth every 4 (four) hours as needed. Patient not taking: Reported on 10/09/2016 12/24/15   Laurey Morale, MD  indomethacin (INDOCIN) 50 MG capsule Take 1 capsule (50 mg total) by mouth 3 (three) times daily as needed  for moderate pain. Patient not taking: Reported on 10/09/2016 06/28/16   Laurey Morale, MD  insulin aspart protamine - aspart (NOVOLOG 70/30 MIX) (70-30) 100 UNIT/ML FlexPen Take 25 units with breakfast, 15 units with lunch and 25 units with dinner Patient not taking: Reported on 10/09/2016 06/07/16   Laurey Morale, MD  Insulin Detemir (LEVEMIR FLEXPEN) 100 UNIT/ML Pen Inject 50 units in the AM and 100 units at bedtime, diagnosis code is E 11.9 Patient not taking: Reported on 10/09/2016 06/09/16   Laurey Morale, MD  losartan (COZAAR) 50 MG tablet Take 1 tablet (50 mg total) by mouth daily. Patient not taking: Reported on 10/09/2016 06/28/16   Annie Main  Raymon Mutton, MD  metolazone (ZAROXOLYN) 2.5 MG tablet Take 1 tablet (2.5 mg total) by mouth daily. TAKE THIS 30 MINUTES PRIOR TO YOUR MORNING DOSE OF LASIX Patient not taking: Reported on 10/09/2016 11/03/14   Dorothy Spark, MD  phenazopyridine (PYRIDIUM) 200 MG tablet Take 1 tablet (200 mg total) by mouth 3 (three) times daily as needed for pain. Reported on 01/21/2016 01/21/16   Laurey Morale, MD    Review of Systems:  Constitutional:  No weight loss, night sweats, Fevers, chills, fatigue.  Head&Eyes: No headache.  No vision loss.  No eye pain or scotoma ENT:  No Difficulty swallowing,Tooth/dental problems,Sore throat,  No ear ache, post nasal drip,  Cardio-vascular:  No chest pain, Orthopnea, PND,  dizziness, palpitations  GI:  No  abdominal pain, nausea, vomiting, diarrhea, loss of appetite, hematochezia, melena, heartburn, indigestion, Resp:  No shortness of breath with exertion or at rest. No cough. No coughing up of blood .No wheezing.No chest wall deformity  Skin:  no rash or lesions.  GU:  no dysuria, change in color of urine, no urgency or frequency. No flank pain.  Musculoskeletal:  No joint pain or swelling. No decreased range of motion.  Psych:  No change in mood or affect.  Neurologic: No , no dysesthesia, no focal weakness, no vision loss.  No syncope  Physical Exam: Vitals:   10/09/16 1430 10/09/16 1530 10/09/16 1600 10/09/16 1744  BP: 155/77 140/71 163/77 145/66  Pulse: 79 66 81 84  Resp: 16 12 19 19   Temp:    98 F (36.7 C)  TempSrc:    Oral  SpO2: 98% 94% 98% 98%  Weight:      Height:       General:  A&O x 3, NAD, nontoxic, pleasant/cooperative Head/Eye: No conjunctival hemorrhage, no icterus, [place]/AT, No nystagmus ENT:  No icterus,  No thrush, good dentition, no pharyngeal exudate Neck:  No masses, no lymphadenpathy, no bruits CV:  RRR, no rub, no gallop, no S3 Lung:  CTAB, good air movement, no wheeze, no rhonchi Abdomen: soft/NT, +BS, nondistended, no peritoneal signs Ext: No cyanosis, No rashes, No petechiae, No lymphangitis, 2 + LE edema Neuro: CNII-XII intact, strength 4/5 in bilateral upper and lower extremities, no dysmetria  Labs on Admission:  Basic Metabolic Panel:  Recent Labs Lab 10/09/16 1041  NA 137  K 4.3  CL 102  CO2 28  GLUCOSE 268*  BUN 26*  CREATININE 0.89  CALCIUM 9.0   Liver Function Tests:  Recent Labs Lab 10/09/16 1041  AST 19  ALT 18  ALKPHOS 88  BILITOT 0.7  PROT 7.3  ALBUMIN 3.5   No results for input(s): LIPASE, AMYLASE in the last 168 hours. No results for input(s): AMMONIA in the last 168 hours. CBC:  Recent Labs Lab 10/09/16 1041  WBC 10.3  NEUTROABS 6.3  HGB 12.3  HCT 37.4  MCV 88.6  PLT 214   Coagulation Profile:  Recent Labs Lab 10/09/16 1041  INR 1.05   Cardiac Enzymes: No results for input(s): CKTOTAL, CKMB, CKMBINDEX, TROPONINI in the last 168 hours. BNP: Invalid input(s): POCBNP CBG:  Recent Labs Lab 10/09/16 1050  GLUCAP 247*   Urine analysis:    Component Value Date/Time   COLORURINE YELLOW 02/02/2016 1654   APPEARANCEUR CLEAR 02/02/2016 1654   LABSPEC 1.036 (H) 02/02/2016 1654   PHURINE 5.5 02/02/2016 1654   GLUCOSEU >1000 (A) 02/02/2016 1654   HGBUR NEGATIVE 02/02/2016 1654   HGBUR trace-lysed 09/12/2010 1538  BILIRUBINUR 2+ 03/10/2016 1015   KETONESUR NEGATIVE 02/02/2016 1654   PROTEINUR 15 03/10/2016 1015   PROTEINUR NEGATIVE 02/02/2016 1654   UROBILINOGEN 4.0 03/10/2016 1015   UROBILINOGEN 0.2 11/10/2014 1225   NITRITE positive 03/10/2016 1015   NITRITE NEGATIVE 02/02/2016 1654   LEUKOCYTESUR large (3+) (A) 03/10/2016 1015   Sepsis Labs: @LABRCNTIP (procalcitonin:4,lacticidven:4) )No results found for this or any previous visit (from the past 240 hour(s)).   Radiological Exams on Admission: Dg Chest 2 View  Result Date: 10/09/2016 CLINICAL DATA:  TIA with a aphasia. EXAM: CHEST  2 VIEW COMPARISON:  11/10/2014 FINDINGS: The heart size and mediastinal contours are within normal limits. There is no evidence of pulmonary edema, consolidation, pneumothorax, nodule or pleural fluid. The thoracic spine shows stable degenerative disc disease. IMPRESSION: No active cardiopulmonary disease. Electronically Signed   By: Aletta Edouard M.D.   On: 10/09/2016 11:19   Ct Head Wo Contrast  Result Date: 10/09/2016 CLINICAL DATA:  Aphasia 2 days ago, generalized lower extremity weakness and left wrist pain, neck pain. EXAM: CT HEAD WITHOUT CONTRAST TECHNIQUE: Contiguous axial images were obtained from the base of the skull through the vertex without intravenous contrast. COMPARISON:  08/25/2015. FINDINGS: Brain: No evidence of an acute infarct, acute hemorrhage, mass lesion, mass effect or hydrocephalus. Atrophy. Mild periventricular low attenuation. Vascular: No hyperdense vessel or unexpected calcification. Skull: Normal. Negative for fracture or focal lesion. Sinuses/Orbits: No acute finding. Other: None. IMPRESSION: 1. No acute intracranial abnormality. 2. Atrophy and chronic microvascular white matter ischemic changes. Electronically Signed   By: Lorin Picket M.D.   On: 10/09/2016 11:23    EKG: Independently reviewed. sinus rhythm with nonspecific ST changes    Time spent:60 minutes Code Status:    FULL Family Communication:  Son updated at bedside Disposition Plan: expect 1-2 day hospitalization Consults called: Neurology DVT Prophylaxis: [place] Lovenox  Skeeter Sheard, DO  [facility] Pager [phone]  If 7PM-7AM, please contact night-coverage [email or web address] Password [facility] 10/09/2016, 6:15 PM

## 2016-10-10 ENCOUNTER — Encounter (HOSPITAL_COMMUNITY): Payer: Self-pay | Admitting: *Deleted

## 2016-10-10 ENCOUNTER — Observation Stay (HOSPITAL_BASED_OUTPATIENT_CLINIC_OR_DEPARTMENT_OTHER): Payer: Medicare Other

## 2016-10-10 ENCOUNTER — Observation Stay (HOSPITAL_COMMUNITY): Payer: Medicare Other

## 2016-10-10 DIAGNOSIS — R471 Dysarthria and anarthria: Secondary | ICD-10-CM

## 2016-10-10 DIAGNOSIS — M7989 Other specified soft tissue disorders: Secondary | ICD-10-CM

## 2016-10-10 DIAGNOSIS — M79609 Pain in unspecified limb: Secondary | ICD-10-CM

## 2016-10-10 DIAGNOSIS — R2681 Unsteadiness on feet: Secondary | ICD-10-CM | POA: Diagnosis not present

## 2016-10-10 DIAGNOSIS — G459 Transient cerebral ischemic attack, unspecified: Secondary | ICD-10-CM | POA: Diagnosis not present

## 2016-10-10 DIAGNOSIS — I10 Essential (primary) hypertension: Secondary | ICD-10-CM

## 2016-10-10 LAB — LIPID PANEL
CHOL/HDL RATIO: 2.6 ratio
CHOLESTEROL: 125 mg/dL (ref 0–200)
HDL: 48 mg/dL (ref >40)
LDL Cholesterol: 61 mg/dL (ref 0–99)
TRIGLYCERIDES: 80 mg/dL (ref <150)
VLDL: 16 mg/dL (ref 0–40)

## 2016-10-10 LAB — URINALYSIS, ROUTINE W REFLEX MICROSCOPIC
BACTERIA UA: NONE SEEN
BILIRUBIN URINE: NEGATIVE
Glucose, UA: NEGATIVE mg/dL
HGB URINE DIPSTICK: NEGATIVE
KETONES UR: NEGATIVE mg/dL
NITRITE: NEGATIVE
PH: 7 (ref 5.0–8.0)
Protein, ur: NEGATIVE mg/dL
Specific Gravity, Urine: 1.015 (ref 1.005–1.030)

## 2016-10-10 LAB — VAS US CAROTID
LCCAPSYS: 119 cm/s
LEFT ECA DIAS: -17 cm/s
LEFT VERTEBRAL DIAS: -14 cm/s
Left CCA dist dias: -17 cm/s
Left CCA dist sys: -90 cm/s
Left CCA prox dias: 20 cm/s
Left ICA dist dias: -28 cm/s
Left ICA dist sys: -112 cm/s
Left ICA prox dias: -13 cm/s
Left ICA prox sys: -77 cm/s
RCCADSYS: -66 cm/s
RIGHT ECA DIAS: -17 cm/s
RIGHT VERTEBRAL DIAS: 17 cm/s
Right CCA prox dias: -19 cm/s
Right CCA prox sys: -112 cm/s

## 2016-10-10 LAB — RAPID URINE DRUG SCREEN, HOSP PERFORMED
Amphetamines: NOT DETECTED
BARBITURATES: NOT DETECTED
Benzodiazepines: NOT DETECTED
COCAINE: NOT DETECTED
OPIATES: NOT DETECTED
Tetrahydrocannabinol: NOT DETECTED

## 2016-10-10 LAB — PROTEIN / CREATININE RATIO, URINE: Creatinine, Urine: 88.78 mg/dL

## 2016-10-10 LAB — GLUCOSE, CAPILLARY
GLUCOSE-CAPILLARY: 204 mg/dL — AB (ref 65–99)
Glucose-Capillary: 212 mg/dL — ABNORMAL HIGH (ref 65–99)
Glucose-Capillary: 286 mg/dL — ABNORMAL HIGH (ref 65–99)
Glucose-Capillary: 317 mg/dL — ABNORMAL HIGH (ref 65–99)

## 2016-10-10 LAB — SEDIMENTATION RATE: Sed Rate: 30 mm/hr — ABNORMAL HIGH (ref 0–22)

## 2016-10-10 LAB — CK: Total CK: 67 U/L (ref 38–234)

## 2016-10-10 MED ORDER — ASPIRIN EC 81 MG PO TBEC
81.0000 mg | DELAYED_RELEASE_TABLET | Freq: Every day | ORAL | Status: DC
  Administered 2016-10-10 – 2016-10-11 (×2): 81 mg via ORAL
  Filled 2016-10-10 (×2): qty 1

## 2016-10-10 MED ORDER — PRAMIPEXOLE DIHYDROCHLORIDE 1 MG PO TABS
5.0000 mg | ORAL_TABLET | Freq: Once | ORAL | Status: AC
  Administered 2016-10-10: 5 mg via ORAL
  Filled 2016-10-10: qty 5

## 2016-10-10 NOTE — Care Management Note (Signed)
Case Management Note  Patient Details  Name: Melanie Mcdonald MRN: [id number] Date of Birth: 18-Sep-1933  Subjective/Objective:                    Action/Plan: Plan is for patient to discharge home with [facility] services when medically ready. CM met with the patient and her son and provided them a list of [facility] agencies. They selected [facility]. Mary with [facility] notified and accepted the referral. Pts son to provide transportation home.  Expected Discharge Date:  10/13/16               Expected Discharge Plan:  [place]  In-House Referral:     Discharge planning Services  CM Consult  Post Acute Care Choice:  Home Health Choice offered to:  Patient, Adult Children  DME Arranged:    DME Agency:     HH Arranged:  PT, OT, Nurse's Aide [place] Agency:  [facility] (now [facility])  Status of Service:  Completed, signed off  If discussed at [place] of Stay Meetings, dates discussed:    Additional Comments:  Pollie Friar, RN 10/10/2016, 3:57 PM

## 2016-10-10 NOTE — Progress Notes (Signed)
Preliminary results by tech - Lower Ext. Venous Duplex Completed. No evidence of deep or superficial vein thrombosis noted in both legs.  Oda Cogan, BS, RDMS, RVT

## 2016-10-10 NOTE — Progress Notes (Signed)
Preliminary results by tech - Carotid Duplex Completed. No evidence a significant stenosis noted in bilateral carotid arteries. Vertebral arteries demonstrated antegrade flow.  Oda Cogan, BS, RDMS, RVT

## 2016-10-10 NOTE — Evaluation (Signed)
Occupational Therapy Evaluation Patient Details Name: Melanie Mcdonald MRN: [id number] DOB: 09-11-1933 Today's Date: 10/10/2016    History of Present Illness Pt is an 83 y.o.femalewho presented to [facility] ED with c/c of dysarthria, beginning 2 days previously. The pt initially refused to go to the ED and over the next 24 hours her dysarthria improved slightly. On 1/8 she finally decided to be seen in the ED, as her PCP had no appointments available. In the ED she stated that her speech was slow. At time of Neurology evaluation she reported 2/10 frontal headache and "blurred vision" that is chronic.    Clinical Impression   PTA, pt required assistance with dressing and bathing tasks and was ambulating with RW or cane at home but also reports frequent furniture walking. Pt has had multiple falls at home. Pt currently requires min guard assist for functional ambulation and min assist for toilet transfers. She required max assist for LB ADL and min assist for UB ADL. She additionally presents decreased L shoulder AROM with pain that has been increasing over the past few months. Pt reports that she has supervision at night from her son but only intermittent supervision during the day. Pt would benefit from continued OT services to improve independence and safety with ADL. Recommend home health OT services for OT follow-up post-acute D/C in addition to 24 hour supervision. OT will continue to follow acutely and plan to introduce AE to improve independence with LB ADL.    Follow Up Recommendations  Home health OT;Supervision/Assistance - 24 hour    Equipment Recommendations       Recommendations for Other Services       Precautions / Restrictions Precautions Precautions: Fall Precaution Comments: Pt reports falls at home Restrictions Weight Bearing Restrictions: No      Mobility Bed Mobility Overal bed mobility: Needs Assistance Bed Mobility: Rolling;Sidelying to Sit Rolling: Min  guard Sidelying to sit: Min assist       General bed mobility comments: Min assist to scoot hips forward and raise trunk.  Transfers Overall transfer level: Needs assistance Equipment used: Rolling walker (2 wheeled) Transfers: Sit to/from Stand Sit to Stand: Min guard;Min assist         General transfer comment: Min guard assist for sit<>stand from bed; min assist from [facility].    Balance Overall balance assessment: Needs assistance Sitting-balance support: Feet supported;No upper extremity supported Sitting balance-Leahy Scale: Fair     Standing balance support: No upper extremity supported Standing balance-Leahy Scale: Fair Standing balance comment: Able to stand briefly without UE support in bathroom at initiation of pericare. However, pt required intermittent min assist to maintain balance and required sitting for safety when placing pad in panties.                            ADL Overall ADL's : Needs assistance/impaired     Grooming: Minimal assistance;Standing   Upper Body Bathing: Minimal assistance;Sitting   Lower Body Bathing: Maximal assistance;Sit to/from stand   Upper Body Dressing : Minimal assistance;Sitting   Lower Body Dressing: Maximal assistance;Sit to/from stand   Toilet Transfer: Min guard;Minimal assistance;BSC;Ambulation;RW (BSC over toilet) Toilet Transfer Details (indicate cue type and reason): Min assist sit<>stand, min guard during ambulation Toileting- Clothing Manipulation and Hygiene: Minimal assistance;Sit to/from stand Toileting - Clothing Manipulation Details (indicate cue type and reason): Min assist to maintain balance while completing standing pericare.     Functional mobility during ADLs: Min  guard;Rolling walker General ADL Comments: Discussed potential use of AE for LB ADL. Began education concerning fall prevention but pt will need further reinforcement.     Vision Vision Assessment?: No apparent visual deficits    Perception     Praxis      Pertinent Vitals/Pain Pain Assessment: No/denies pain     Hand Dominance Right   Extremity/Trunk Assessment Upper Extremity Assessment Upper Extremity Assessment: Generalized weakness;RUE deficits/detail;LUE deficits/detail RUE Deficits / Details: Reports she has some pain in her R shoulder but this is less than the L. LUE Deficits / Details: Reports that she has had pain beginning in forearm that now involves her shoulder and has significantly progressed over the past month. L elbow and hand WFL. Limited to approximately 0-15 degrees pain free shoulder flexion limiting UB ADL abilities.   Lower Extremity Assessment Lower Extremity Assessment: Generalized weakness   Cervical / Trunk Assessment Cervical / Trunk Assessment: Kyphotic   Communication Communication Communication: No difficulties   Cognition Arousal/Alertness: Awake/alert Behavior During Therapy: WFL for tasks assessed/performed Overall Cognitive Status: Within Functional Limits for tasks assessed                     General Comments       Exercises       Shoulder Instructions      Home Living Family/patient expects to be discharged to:: Private residence Living Arrangements: Children Available Help at Discharge: Family;Available PRN/intermittently Type of Home: House       Home Layout: One level     Bathroom Shower/Tub: Occupational psychologist: Standard     Home Equipment: Environmental consultant - 4 wheels;Bedside commode;Shower seat;Grab bars - toilet   Additional Comments: Pt's son lives with her but works from ~6 am to 6pm. She reports that her gardener is available PRN while he is at work and stays with her on weekends when her son returns to his home in [place].      Prior Functioning/Environment Level of Independence: Needs assistance  Gait / Transfers Assistance Needed: Used [phone] all the time ADL's / Homemaking Assistance Needed: Assist to dress L UE due  to pain with L shoulder movement. Assist for LB dressing and bathing as well as IADL/homemaking   Comments: Pt reports unable to wash/dress LE's. Used to garden a lot but has been unable to for approximately a year.        OT Problem List: Decreased strength;Decreased range of motion;Decreased activity tolerance;Impaired balance (sitting and/or standing);Decreased safety awareness;Decreased knowledge of use of DME or AE;Decreased knowledge of precautions;Pain;Impaired UE functional use   OT Treatment/Interventions: Self-care/ADL training;Therapeutic exercise;Energy conservation;Therapeutic activities;Patient/family education;Balance training    OT Goals(Current goals can be found in the care plan section) Acute Rehab OT Goals Patient Stated Goal: to get back to everyday routine. OT Goal Formulation: With patient Time For Goal Achievement: 10/17/16 Potential to Achieve Goals: Good ADL Goals Pt Will Perform Lower Body Bathing: with modified independence;with adaptive equipment;sit to/from stand Pt Will Perform Lower Body Dressing: with modified independence;with adaptive equipment;sit to/from stand Pt Will Transfer to Toilet: with modified independence;ambulating;bedside commode (BSC over toilet) Pt Will Perform Toileting - Clothing Manipulation and hygiene: with modified independence;sit to/from stand Pt Will Perform Tub/Shower Transfer: with supervision;rolling walker;Shower transfer;3 in 1;shower seat;Tub transfer Additional ADL Goal #1: Pt will identify and incorporate 3 strategies to conserve energy during daily routine.  OT Frequency: Min 2X/week   Barriers to D/C:  Co-evaluation PT/OT/SLP Co-Evaluation/Treatment: Yes Reason for Co-Treatment: For patient/therapist safety PT goals addressed during session: Mobility/safety with mobility;Balance;Proper use of DME OT goals addressed during session: ADL's and self-care      End of Session Equipment Utilized During  Treatment: Gait belt;Rolling walker Nurse Communication: Mobility status  Activity Tolerance: Patient tolerated treatment well Patient left: in chair;with call bell/phone within reach   Time: [id number] OT Time Calculation (min): 42 min Charges:  OT General Charges $OT Visit: 1 Procedure OT Evaluation $OT Eval Moderate Complexity: 1 Procedure OT Treatments $Self Care/Home Management : 8-22 mins  Norman Herrlich, OTR/L [phone] 10/10/2016, 11:38 AM

## 2016-10-10 NOTE — Care Management Note (Signed)
Case Management Note  Patient Details  Name: Melanie Mcdonald MRN: [id number] Date of Birth: 04-09-1933  Subjective/Objective:          Patient presents with dysarthria. Lives at home with her son. CM will follow for discharge needs pending PT/OT evals and physician orders.           Action/Plan:   Expected Discharge Date:  10/13/16               Expected Discharge Plan:     In-House Referral:     Discharge planning Services     Post Acute Care Choice:    Choice offered to:     DME Arranged:    DME Agency:     HH Arranged:    HH Agency:     Status of Service:     If discussed at [employer], dates discussed:    Additional Comments:  Rolm Baptise, RN 10/10/2016, 10:07 AM

## 2016-10-10 NOTE — Care Management Obs Status (Signed)
[place] NOTIFICATION   Patient Details  Name: TINY BARTOS MRN: [id number] Date of Birth: 1932/10/18   Medicare Observation Status Notification Given:  Yes    Pollie Friar, RN 10/10/2016, 2:10 PM

## 2016-10-10 NOTE — Evaluation (Signed)
Physical Therapy Evaluation Patient Details Name: Melanie Mcdonald MRN: [id number] DOB: [birth year] Today's Date: 10/10/2016   History of Present Illness  Pt is an 83 y.o.femalewho presented to [facility] ED with c/c of dysarthria, beginning 2 days previously. The pt initially refused to go to the ED and over the next 24 hours her dysarthria improved slightly. On 1/8 she finally decided to be seen in the ED, as her PCP had no appointments available. In the ED she stated that her speech was slow. At time of Neurology evaluation she reported 2/10 frontal headache and "blurred vision" that is chronic.   Clinical Impression  Pt admitted with above diagnosis. Pt currently with functional limitations due to the deficits listed below (see PT Problem List). At the time of PT eval pt was able to perform transfers and ambulation min guard assist to min assist for functional mobility. Pt agreeable to HHPT follow up upon d/c and states she has had falls recently at home. Pt will benefit from skilled PT to increase their independence and safety with mobility to allow discharge to the venue listed below.       Follow Up Recommendations Home health PT;Supervision for mobility/OOB    Equipment Recommendations  None recommended by PT    Recommendations for Other Services       Precautions / Restrictions Precautions Precautions: Fall Precaution Comments: Pt reports falls at home Restrictions Weight Bearing Restrictions: No      Mobility  Bed Mobility Overal bed mobility: Needs Assistance Bed Mobility: Rolling;Sidelying to Sit Rolling: Min guard Sidelying to sit: Min assist       General bed mobility comments: Pt sitting EOB when PT arrived  Transfers Overall transfer level: Needs assistance Equipment used: Rolling walker (2 wheeled) Transfers: Sit to/from Stand Sit to Stand: Min guard         General transfer comment: Steadying assist as pt powered up to full standing position.    Ambulation/Gait Ambulation/Gait assistance: Min guard   Assistive device: Rolling walker (2 wheeled) Gait Pattern/deviations: Step-through pattern;Decreased stride length;Trendelenburg Gait velocity: Decreased   General Gait Details: VC's for walker management and general safety during gait training. Distance limited by need for bathroom use. No obvious LOB.   Stairs            Wheelchair Mobility    Modified Rankin (Stroke Patients Only)       Balance Overall balance assessment: Needs assistance Sitting-balance support: Feet supported;No upper extremity supported Sitting balance-Leahy Scale: Fair     Standing balance support: No upper extremity supported Standing balance-Leahy Scale: Fair Standing balance comment: Pt able to stand without UE support in bathroom to perform peri-care, however required close guard and transfer to sitting for safety when placing pad in panties.                               Pertinent Vitals/Pain Pain Assessment: No/denies pain    Home Living Family/patient expects to be discharged to:: Private residence Living Arrangements: Children Available Help at Discharge: Family;Available PRN/intermittently Type of Home: House       Home Layout: One level Home Equipment: [place] - 4 wheels;Bedside commode;Shower seat;Grab bars - toilet Additional Comments: Pt's son lives with her but works from ~6 am to 6pm. She reports that her gardener is available PRN while he is at work and stays with her on weekends when her son returns to his home in [place].  Prior Function Level of Independence: Needs assistance   Gait / Transfers Assistance Needed: Used [phone] all the time  ADL's / Homemaking Assistance Needed: Assist to dress L UE due to pain with L shoulder movement. Assist for LB dressing and bathing as well as IADL/homemaking  Comments: Pt reports unable to wash/dress LE's. Used to garden a lot but has been unable to for  approximately a year.     Hand Dominance   Dominant Hand: Right    Extremity/Trunk Assessment   Upper Extremity Assessment Upper Extremity Assessment: Defer to OT evaluation RUE Deficits / Details: Reports she has some pain in her R shoulder but this is less than the L. LUE Deficits / Details: Reports that she has had pain beginning in forearm that now involves her shoulder and has significantly progressed over the past month. L elbow and hand WFL. Limited to approximately 0-15 degrees pain free shoulder flexion limiting UB ADL abilities.    Lower Extremity Assessment Lower Extremity Assessment: Generalized weakness    Cervical / Trunk Assessment Cervical / Trunk Assessment: Kyphotic  Communication   Communication: No difficulties  Cognition Arousal/Alertness: Awake/alert Behavior During Therapy: WFL for tasks assessed/performed Overall Cognitive Status: Within Functional Limits for tasks assessed                      General Comments      Exercises     Assessment/Plan    PT Assessment Patient needs continued PT services  PT Problem List Decreased strength;Decreased range of motion;Decreased activity tolerance;Decreased balance;Decreased mobility;Decreased knowledge of use of DME;Decreased safety awareness;Decreased knowledge of precautions;Pain          PT Treatment Interventions DME instruction;Gait training;Stair training;Functional mobility training;Therapeutic activities;Therapeutic exercise;Neuromuscular re-education;Patient/family education    PT Goals (Current goals can be found in the Care Plan section)  Acute Rehab PT Goals Patient Stated Goal: to get back to everyday routine. PT Goal Formulation: With patient Time For Goal Achievement: 10/17/16 Potential to Achieve Goals: Good    Frequency Min 3X/week   Barriers to discharge        Co-evaluation PT/OT/SLP Co-Evaluation/Treatment: Yes Reason for Co-Treatment: To address functional/ADL  transfers PT goals addressed during session: Mobility/safety with mobility;Balance;Proper use of DME OT goals addressed during session: ADL's and self-care       End of Session Equipment Utilized During Treatment: Gait belt Activity Tolerance: Patient tolerated treatment well Patient left: in chair;with call bell/phone within reach;with chair alarm set Nurse Communication: Mobility status    Functional Assessment Tool Used: Clinical judgement Functional Limitation: Mobility: Walking and moving around Mobility: Walking and Moving Around Current Status [id number]): At least 20 percent but less than 40 percent impaired, limited or restricted Mobility: Walking and Moving Around Goal Status [phone]): At least 1 percent but less than 20 percent impaired, limited or restricted    Time: 0820-0848 PT Time Calculation (min) (ACUTE ONLY): 28 min   Charges:   PT Evaluation $PT Eval Moderate Complexity: 1 Procedure     PT G Codes:   PT G-Codes **NOT FOR INPATIENT CLASS** Functional Assessment Tool Used: Clinical judgement Functional Limitation: Mobility: Walking and moving around Mobility: Walking and Moving Around Current Status [id number]): At least 20 percent but less than 40 percent impaired, limited or restricted Mobility: Walking and Moving Around Goal Status [phone]): At least 1 percent but less than 20 percent impaired, limited or restricted    Thelma Comp 10/10/2016, 1:16 PM  Rolinda Roan, PT, DPT Acute Rehabilitation  Services Pager: [phone]

## 2016-10-10 NOTE — Progress Notes (Signed)
Patient declined MRI to left shoulder. MD notified.

## 2016-10-10 NOTE — Progress Notes (Signed)
[facility] PROGRESS NOTE  Melanie Mcdonald [id number] DOB: 1932-11-28 DOA: 10/09/2016   PCP: Alysia Penna, MD     Assessment/Plan: Active Problems:   Essential hypertension   Peripheral vascular disease ([place])   TIA (transient ischemic attack)   Gait instability   Uncontrolled type 2 diabetes mellitus with hyperglycemia, with long-term current use of insulin ([place])   Dysarthria  Pt is an 83 y.o.femalewho presented to [facility] ED with c/c of dysarthria, beginning 2 days previously. The pt initially refused to go to the ED and over the next 24 hours her dysarthria improved slightly. On 1/8 she finally decided to be seen in the ED, as her PCP had no appointments available. In the ED she stated that her speech was slow. At time of Neurology evaluation she reported 2/10 frontal headache and "blurred vision" that is chronic CT of the brain was negative. EKG showed sinus rhythm with nonspecific ST changes.  Assessment/Plan: Dysarthria/TIA vs seizure  -Concern about ischemic stroke -Neurology consulted-->transfer to [facility] for full stroke workup -PT/OT evaluation-Home health OT;Supervision/Assistance - 24 hour  -Speech therapy eval -CT brain--neg -MRI brain--no acute CVA  -Carotid Duplex--No evidence a significant stenosis  -Echo--  -LDL 59 -HbA1C-pending -Antiplatelet--ASA 325 mg daily    Hypertension -The patient was not taking her losartan as directed -Follow for permissive hypertension -hydralazine prn SBP > 220  Diabetes mellitus type 2 -Hemoglobin A1c -pt no longer taking levemir-takes Humulin U-500 -resistant sliding scale for now -novolog 4 units with meals  Lower extremity edema -Venous duplex -Urine protein creatinine ratio  Gait instability -Patient had a mechanical fall on 10/07/2011 -PT evaluation -Minimize hypnotic medications -X-ray left shoulder No acute osseous abnormality, pain started after her fall in nov,2017, have ordered MRI left shoulder,  trial of robaxin -UA  Negative  -UDS     DVT prophylaxsis   Code Status:  Full code     Family Communication: Discussed in detail with the patient, all imaging results, lab results explained to the patient   Disposition Plan: in am       Consultants:  neurology  Procedures:  None   Antibiotics: Anti-infectives    None         HPI/Subjective: Patient complaining of left shoulder pain for the last 2 months, unable to raise her left arm,  Objective: Vitals:   10/10/16 0315 10/10/16 0515 10/10/16 0715 10/10/16 1027  BP: (!) 127/53 (!) 127/56 (!) 142/57 (!) 133/46  Pulse: 72 71 72 71  Resp: 16 16 16 18   Temp: 98.1 F (36.7 C) 98.1 F (36.7 C) 98.1 F (36.7 C) 98.8 F (37.1 C)  TempSrc: Oral Oral Oral Oral  SpO2: 94% 92% 95% 95%  Weight:      Height:       No intake or output data in the 24 hours ending 10/10/16 1306  Exam:  Examination:  General exam: Appears calm and comfortable  Respiratory system: Clear to auscultation. Respiratory effort normal. Cardiovascular system: S1 & S2 heard, RRR. No JVD, murmurs, rubs, gallops or clicks. No pedal edema. Gastrointestinal system: Abdomen is nondistended, soft and nontender. No organomegaly or masses felt. Normal bowel sounds heard. Central nervous system: Alert and oriented. No focal neurological deficits. Extremities: Left shoulder mobility limited by pain, unable to abduct or adduct her left arm Skin: No rashes, lesions or ulcers Psychiatry: Judgement and insight appear normal. Mood & affect appropriate.     Data Reviewed: I have personally reviewed following  labs and imaging studies  Micro Results No results found for this or any previous visit (from the past 240 hour(s)).  Radiology Reports Dg Chest 2 View  Result Date: 10/09/2016 CLINICAL DATA:  TIA with a aphasia. EXAM: CHEST  2 VIEW COMPARISON:  11/10/2014 FINDINGS: The heart size and mediastinal contours are within normal limits. There is  no evidence of pulmonary edema, consolidation, pneumothorax, nodule or pleural fluid. The thoracic spine shows stable degenerative disc disease. IMPRESSION: No active cardiopulmonary disease. Electronically Signed   By: Aletta Edouard M.D.   On: 10/09/2016 11:19   Ct Head Wo Contrast  Result Date: 10/09/2016 CLINICAL DATA:  Aphasia 2 days ago, generalized lower extremity weakness and left wrist pain, neck pain. EXAM: CT HEAD WITHOUT CONTRAST TECHNIQUE: Contiguous axial images were obtained from the base of the skull through the vertex without intravenous contrast. COMPARISON:  08/25/2015. FINDINGS: Brain: No evidence of an acute infarct, acute hemorrhage, mass lesion, mass effect or hydrocephalus. Atrophy. Mild periventricular low attenuation. Vascular: No hyperdense vessel or unexpected calcification. Skull: Normal. Negative for fracture or focal lesion. Sinuses/Orbits: No acute finding. Other: None. IMPRESSION: 1. No acute intracranial abnormality. 2. Atrophy and chronic microvascular white matter ischemic changes. Electronically Signed   By: Lorin Picket M.D.   On: 10/09/2016 11:23   Mr Brain Wo Contrast  Result Date: 10/10/2016 CLINICAL DATA:  Dysarthria EXAM: MRI HEAD WITHOUT CONTRAST MRA HEAD WITHOUT CONTRAST TECHNIQUE: Multiplanar, multiecho pulse sequences of the brain and surrounding structures were obtained without intravenous contrast. Angiographic images of the head were obtained using MRA technique without contrast. COMPARISON:  None. FINDINGS: MRI HEAD FINDINGS Brain: No focal diffusion restriction to indicate acute infarct. No intraparenchymal hemorrhage. Small remote infarct within the right frontal white matter. Mild periventricular hyperintense T2 weighted signal. No mass lesion or midline shift. No hydrocephalus or extra-axial fluid collection. The midline structures are normal. No age advanced or lobar predominant atrophy. Vascular: Major intracranial arterial and venous sinus flow  voids are preserved. No evidence of chronic microhemorrhage or amyloid angiopathy. Skull and upper cervical spine: The visualized skull base, calvarium, upper cervical spine and extracranial soft tissues are normal. Sinuses/Orbits: No fluid levels or advanced mucosal thickening. No mastoid effusion. Normal orbits. MRA HEAD FINDINGS Intracranial internal carotid arteries: Normal. Anterior cerebral arteries: Normal. Middle cerebral arteries: Normal. Posterior communicating arteries: Present bilaterally. Posterior cerebral arteries: Normal. Basilar artery: Normal. Vertebral arteries: Codominant. Normal. Superior cerebellar arteries: Normal. Anterior inferior cerebellar arteries: Normal on the right. Not clearly visualized on the left. Posterior inferior cerebellar arteries: Normal. IMPRESSION: 1. No acute intracranial abnormality. 2. Old, small right frontal infarct and findings of mild chronic microvascular ischemia. 3. No intracranial arterial occlusion, aneurysm or high-grade stenosis. Electronically Signed   By: Ulyses Jarred M.D.   On: 10/10/2016 03:14   Dg Shoulder Left  Result Date: 10/09/2016 CLINICAL DATA:  Anterior left shoulder pain radiating to the neck EXAM: LEFT SHOULDER - 2+ VIEW COMPARISON:  None. FINDINGS: Mild AC joint degenerative change. No acute fracture or dislocation is visualized. Left lung apex clear. Atherosclerotic calcifications of the aortic arch. IMPRESSION: No acute osseous abnormality Electronically Signed   By: Donavan Foil M.D.   On: 10/09/2016 20:23   [name] Head/brain [id number] Cm  Result Date: 10/10/2016 CLINICAL DATA:  Dysarthria EXAM: MRI HEAD WITHOUT CONTRAST MRA HEAD WITHOUT CONTRAST TECHNIQUE: Multiplanar, multiecho pulse sequences of the brain and surrounding structures were obtained without intravenous contrast. Angiographic images of the head were obtained using  MRA technique without contrast. COMPARISON:  None. FINDINGS: MRI HEAD FINDINGS Brain: No focal diffusion  restriction to indicate acute infarct. No intraparenchymal hemorrhage. Small remote infarct within the right frontal white matter. Mild periventricular hyperintense T2 weighted signal. No mass lesion or midline shift. No hydrocephalus or extra-axial fluid collection. The midline structures are normal. No age advanced or lobar predominant atrophy. Vascular: Major intracranial arterial and venous sinus flow voids are preserved. No evidence of chronic microhemorrhage or amyloid angiopathy. Skull and upper cervical spine: The visualized skull base, calvarium, upper cervical spine and extracranial soft tissues are normal. Sinuses/Orbits: No fluid levels or advanced mucosal thickening. No mastoid effusion. Normal orbits. MRA HEAD FINDINGS Intracranial internal carotid arteries: Normal. Anterior cerebral arteries: Normal. Middle cerebral arteries: Normal. Posterior communicating arteries: Present bilaterally. Posterior cerebral arteries: Normal. Basilar artery: Normal. Vertebral arteries: Codominant. Normal. Superior cerebellar arteries: Normal. Anterior inferior cerebellar arteries: Normal on the right. Not clearly visualized on the left. Posterior inferior cerebellar arteries: Normal. IMPRESSION: 1. No acute intracranial abnormality. 2. Old, small right frontal infarct and findings of mild chronic microvascular ischemia. 3. No intracranial arterial occlusion, aneurysm or high-grade stenosis. Electronically Signed   By: Ulyses Jarred M.D.   On: 10/10/2016 03:14     CBC  Recent Labs Lab 10/09/16 1041  WBC 10.3  HGB 12.3  HCT 37.4  PLT 214  MCV 88.6  MCH 29.1  MCHC 32.9  RDW 14.0  LYMPHSABS 3.1  MONOABS 0.6  EOSABS 0.3  BASOSABS 0.0    Chemistries   Recent Labs Lab 10/09/16 1041  NA 137  K 4.3  CL 102  CO2 28  GLUCOSE 268*  BUN 26*  CREATININE 0.89  CALCIUM 9.0  AST 19  ALT 18  ALKPHOS 88  BILITOT 0.7    ------------------------------------------------------------------------------------------------------------------ estimated creatinine clearance is 62.6 mL/min (by C-G formula based on SCr of 0.89 mg/dL). ------------------------------------------------------------------------------------------------------------------ No results for input(s): HGBA1C in the last 72 hours. ------------------------------------------------------------------------------------------------------------------  Recent Labs  10/10/16 0602  CHOL 125  HDL 48  LDLCALC 61  TRIG 80  CHOLHDL 2.6   ------------------------------------------------------------------------------------------------------------------ No results for input(s): TSH, T4TOTAL, T3FREE, THYROIDAB in the last 72 hours.  Invalid input(s): FREET3 ------------------------------------------------------------------------------------------------------------------ No results for input(s): VITAMINB12, FOLATE, FERRITIN, TIBC, IRON, RETICCTPCT in the last 72 hours.  Coagulation profile  Recent Labs Lab 10/09/16 1041  INR 1.05    No results for input(s): DDIMER in the last 72 hours.  Cardiac Enzymes No results for input(s): CKMB, TROPONINI, MYOGLOBIN in the last 168 hours.  Invalid input(s): CK ------------------------------------------------------------------------------------------------------------------ Invalid input(s): POCBNP   CBG:  Recent Labs Lab 10/09/16 1050 10/09/16 2124 10/10/16 0613 10/10/16 1128  GLUCAP 247* 118* 204* 212*       Studies: Dg Chest 2 View  Result Date: 10/09/2016 CLINICAL DATA:  TIA with a aphasia. EXAM: CHEST  2 VIEW COMPARISON:  11/10/2014 FINDINGS: The heart size and mediastinal contours are within normal limits. There is no evidence of pulmonary edema, consolidation, pneumothorax, nodule or pleural fluid. The thoracic spine shows stable degenerative disc disease. IMPRESSION: No active  cardiopulmonary disease. Electronically Signed   By: Aletta Edouard M.D.   On: 10/09/2016 11:19   Ct Head Wo Contrast  Result Date: 10/09/2016 CLINICAL DATA:  Aphasia 2 days ago, generalized lower extremity weakness and left wrist pain, neck pain. EXAM: CT HEAD WITHOUT CONTRAST TECHNIQUE: Contiguous axial images were obtained from the base of the skull through the vertex without intravenous contrast. COMPARISON:  08/25/2015. FINDINGS: Brain: No  evidence of an acute infarct, acute hemorrhage, mass lesion, mass effect or hydrocephalus. Atrophy. Mild periventricular low attenuation. Vascular: No hyperdense vessel or unexpected calcification. Skull: Normal. Negative for fracture or focal lesion. Sinuses/Orbits: No acute finding. Other: None. IMPRESSION: 1. No acute intracranial abnormality. 2. Atrophy and chronic microvascular white matter ischemic changes. Electronically Signed   By: Lorin Picket M.D.   On: 10/09/2016 11:23   Mr Brain Wo Contrast  Result Date: 10/10/2016 CLINICAL DATA:  Dysarthria EXAM: MRI HEAD WITHOUT CONTRAST MRA HEAD WITHOUT CONTRAST TECHNIQUE: Multiplanar, multiecho pulse sequences of the brain and surrounding structures were obtained without intravenous contrast. Angiographic images of the head were obtained using MRA technique without contrast. COMPARISON:  None. FINDINGS: MRI HEAD FINDINGS Brain: No focal diffusion restriction to indicate acute infarct. No intraparenchymal hemorrhage. Small remote infarct within the right frontal white matter. Mild periventricular hyperintense T2 weighted signal. No mass lesion or midline shift. No hydrocephalus or extra-axial fluid collection. The midline structures are normal. No age advanced or lobar predominant atrophy. Vascular: Major intracranial arterial and venous sinus flow voids are preserved. No evidence of chronic microhemorrhage or amyloid angiopathy. Skull and upper cervical spine: The visualized skull base, calvarium, upper cervical  spine and extracranial soft tissues are normal. Sinuses/Orbits: No fluid levels or advanced mucosal thickening. No mastoid effusion. Normal orbits. MRA HEAD FINDINGS Intracranial internal carotid arteries: Normal. Anterior cerebral arteries: Normal. Middle cerebral arteries: Normal. Posterior communicating arteries: Present bilaterally. Posterior cerebral arteries: Normal. Basilar artery: Normal. Vertebral arteries: Codominant. Normal. Superior cerebellar arteries: Normal. Anterior inferior cerebellar arteries: Normal on the right. Not clearly visualized on the left. Posterior inferior cerebellar arteries: Normal. IMPRESSION: 1. No acute intracranial abnormality. 2. Old, small right frontal infarct and findings of mild chronic microvascular ischemia. 3. No intracranial arterial occlusion, aneurysm or high-grade stenosis. Electronically Signed   By: Ulyses Jarred M.D.   On: 10/10/2016 03:14   Dg Shoulder Left  Result Date: 10/09/2016 CLINICAL DATA:  Anterior left shoulder pain radiating to the neck EXAM: LEFT SHOULDER - 2+ VIEW COMPARISON:  None. FINDINGS: Mild AC joint degenerative change. No acute fracture or dislocation is visualized. Left lung apex clear. Atherosclerotic calcifications of the aortic arch. IMPRESSION: No acute osseous abnormality Electronically Signed   By: Donavan Foil M.D.   On: 10/09/2016 20:23   [name] Head/brain [id number] Cm  Result Date: 10/10/2016 CLINICAL DATA:  Dysarthria EXAM: MRI HEAD WITHOUT CONTRAST MRA HEAD WITHOUT CONTRAST TECHNIQUE: Multiplanar, multiecho pulse sequences of the brain and surrounding structures were obtained without intravenous contrast. Angiographic images of the head were obtained using MRA technique without contrast. COMPARISON:  None. FINDINGS: MRI HEAD FINDINGS Brain: No focal diffusion restriction to indicate acute infarct. No intraparenchymal hemorrhage. Small remote infarct within the right frontal white matter. Mild periventricular hyperintense T2 weighted  signal. No mass lesion or midline shift. No hydrocephalus or extra-axial fluid collection. The midline structures are normal. No age advanced or lobar predominant atrophy. Vascular: Major intracranial arterial and venous sinus flow voids are preserved. No evidence of chronic microhemorrhage or amyloid angiopathy. Skull and upper cervical spine: The visualized skull base, calvarium, upper cervical spine and extracranial soft tissues are normal. Sinuses/Orbits: No fluid levels or advanced mucosal thickening. No mastoid effusion. Normal orbits. MRA HEAD FINDINGS Intracranial internal carotid arteries: Normal. Anterior cerebral arteries: Normal. Middle cerebral arteries: Normal. Posterior communicating arteries: Present bilaterally. Posterior cerebral arteries: Normal. Basilar artery: Normal. Vertebral arteries: Codominant. Normal. Superior cerebellar arteries: Normal. Anterior inferior cerebellar arteries: Normal  on the right. Not clearly visualized on the left. Posterior inferior cerebellar arteries: Normal. IMPRESSION: 1. No acute intracranial abnormality. 2. Old, small right frontal infarct and findings of mild chronic microvascular ischemia. 3. No intracranial arterial occlusion, aneurysm or high-grade stenosis. Electronically Signed   By: Ulyses Jarred M.D.   On: 10/10/2016 03:14      Lab Results  Component Value Date   HGBA1C 8.9 06/07/2016   HGBA1C 8.4 (H) 05/10/2015   HGBA1C 9.7 (H) 12/10/2013   Lab Results  Component Value Date   LDLCALC 61 10/10/2016   CREATININE 0.89 10/09/2016       Scheduled Meds: . aspirin EC  81 mg Oral Daily  . enoxaparin (LOVENOX) injection  40 mg Subcutaneous Q24H  . furosemide  40 mg Oral Q12H  . insulin aspart  0-20 Units Subcutaneous TID WC  . insulin aspart  0-5 Units Subcutaneous QHS  . insulin aspart  4 Units Subcutaneous TID WC   Continuous Infusions: . sodium chloride       LOS: 0 days    Time spent: >30 MINS    [facility]  [facility] Pager [phone]. If 7PM-7AM, please contact night-coverage at [email or web address], password [facility] 10/10/2016, 1:06 PM  LOS: 0 days

## 2016-10-10 NOTE — Consult Note (Signed)
Referring Physician: Dr. Allyson Sabal    Chief Complaint: TIA versus stroke  HPI: Melanie Mcdonald is an 83 y.o. female who presented to [facility] ED with c/c of dysarthria, beginning 2 days previously. Her son stated that at about 11 AM on 1/6 he noticed dysarthric speech while speaking with her on the telephone. He asked a friend to check on her and the friend also noted dysarthria, calling EMS. The patient refused to go to the ED at that point and over the next 24 hours her dysarthria improved slightly. On 1/8 she finally decided to be seen in the ED, as her PCP had no appointments available. In the ED she stated that her speech was slow. At that time she denied vision loss, headache, sensory changes or focal weakness. At time of Neurology evaluation she is still negative for the above, except for 2/10 frontal headache and "blurred vision" that is chronic. She denies CP, abdominal pain or limb pain except for left shoulder pain that is chronic. She endorses SOB with exertion.   Her PMHx includes HTN, DM, HLD, GERD and COPD.  CT of the brain was negative. EKG showed sinus rhythm with nonspecific ST changes.   Past Medical History:  Diagnosis Date  . ANEMIA 01/30/2008  . ARTHRITIS 12/11/2007  . BACK PAIN, CHRONIC 06/23/2008  . BREAST CYST, RIGHT 12/17/2007  . Colon polyps    FRAGMENTS OF HYPERPLASTIC POLYP  . CONTACT DERMATITIS 03/10/2009  . COPD (chronic obstructive pulmonary disease) ([place])   . CORONARY ARTERY DISEASE 03/01/2007   had a normal Myoview stress test 07-13-11  . CYSTOCELE WITHOUT MENTION UTERINE PROLAPSE LAT 09/12/2010  . DEGENERATIVE JOINT DISEASE 08/07/2007  . DEPRESSION 03/01/2007  . DIABETES MELLITUS, TYPE II 08/26/2008   sees Dr. Dwyane Dee   . Diverticulosis of colon (without mention of hemorrhage)   . Esophageal reflux 11/06/2007  . HYPERKERATOSIS 06/02/2009  . HYPERLIPIDEMIA 08/26/2008  . HYPERTENSION 03/01/2007  . Irritable bowel syndrome 01/26/2009  . LABYRINTHITIS 11/03/2009  . Narcolepsy  without cataplexy(347.00) 08/26/2008  . NEPHROLITHIASIS 04/29/2009  . OBESITY 03/01/2007  . PERIPHERAL VASCULAR DISEASE 01/26/2009  . SYNCOPE 08/26/2008   had brain MRI on 06-28-12 showing only chronic microvascular ischemia and atrophy   . TRANSIENT ISCHEMIC ATTACK 10/20/2009   had normal brain MRA with patent vertebrals and carotids 06-28-12    Past Surgical History:  Procedure Laterality Date  . ABDOMINAL HYSTERECTOMY    . BACK SURGERY     x2  . CARDIAC CATHETERIZATION  07/2009  . CATARACT EXTRACTION    . COLONOSCOPY  11-13-07   per Dr. Deatra Ina, benign polyps, repeat in 5 yrs  . ESOPHAGOGASTRODUODENOSCOPY  11-13-07   per Dr. Deatra Ina, normal   . INTRAOCULAR LENS INSERTION    . SPINE SURGERY     x 3  . TONSILECTOMY, ADENOIDECTOMY, BILATERAL MYRINGOTOMY AND TUBES      Family History  Problem Relation Age of Onset  . Lupus Mother   . COPD Father    Social History:  reports that she has never smoked. She has never used smokeless tobacco. She reports that she does not drink alcohol or use drugs.  Allergies:  Allergies  Allergen Reactions  . Aspirin Other (See Comments)    Abdominal pain  . Ciprofloxacin Nausea And Vomiting    Made pt very sick on stomach  . Clarithromycin     Made stomach burn  . Doxycycline Itching  . Erythromycin Nausea And Vomiting  . Lisinopril Cough  . Lyrica [  Pregabalin] Other (See Comments)    Suicidal   . Macrobid [Nitrofurantoin Monohyd Macro] Nausea And Vomiting  . Metolazone Other (See Comments)    Pt stated this made her BP drop  . Other     Pt cannot have any of the MYCIN's  . Penicillins Swelling    Has patient had a PCN reaction causing immediate rash, facial/tongue/throat swelling, SOB or lightheadedness with hypotension:  Has patient had a PCN reaction causing severe rash involving mucus membranes or skin necrosis:  Has patient had a PCN reaction that required hospitalization  Has patient had a PCN reaction occurring within the last 10  years:  If all of the above answers are "NO", then may proceed with Cephalosporin use. Childhood allergy  . Sulfonamide Derivatives Nausea And Vomiting    Medications:  Scheduled: . aspirin EC  324 mg Oral Q2200  . aspirin  300 mg Rectal Daily  . enoxaparin (LOVENOX) injection  40 mg Subcutaneous Q24H  . furosemide  40 mg Oral Q12H  . insulin aspart  0-20 Units Subcutaneous TID WC  . insulin aspart  0-5 Units Subcutaneous QHS  . insulin aspart  4 Units Subcutaneous TID WC    ROS: As per HPI.   Physical Examination: Blood pressure (!) 127/56, pulse 71, temperature 98.1 F (36.7 C), temperature source Oral, resp. rate 16, height _0  (1.702 m), weight 107.6 kg (237 lb 3.4 oz), SpO2 92 %.  HEENT: [place]/AT Ext: Mild pretibial edema bilaterally. Skin: Noncyanotic. No pallor noted.  Neurologic Examination: Ment: Intact to complex questions and commands. No aphasia noted.  CN: PERRL. Visual fields intact. EOMI without nystagmus. Facial temp sensation normal. No facial droop or dysarthria. Hearing intact to conversation. Palate elevates normally. Tongue midline.  Motor: 4+/5 bilateral grip, biceps and triceps. 4+/5 right deltoid. Unable to test left deltoid secondary to chronic pain.  Sensory: Intact to FT without extinction. Temperature sensation intact x 4.  Reflexes: 1+ achilles and patellae bilaterally. 2+ brachioradialis and biceps bilaterally.  Cerebellar: No ataxia with FNF bilaterally. No tremor noted.  Gait: Deferred.   Results for orders placed or performed during the hospital encounter of 10/09/16 (from the past 48 hour(s))  Urinalysis, Routine w reflex microscopic     Status: Abnormal   Collection Time: 10/09/16  1:20 AM  Result Value Ref Range   Color, Urine YELLOW YELLOW   APPearance CLEAR CLEAR   Specific Gravity, Urine 1.015 1.005 - 1.030   pH 7.0 5.0 - 8.0   Glucose, UA NEGATIVE NEGATIVE mg/dL   Hgb urine dipstick NEGATIVE NEGATIVE   Bilirubin Urine NEGATIVE  NEGATIVE   Ketones, ur NEGATIVE NEGATIVE mg/dL   Protein, ur NEGATIVE NEGATIVE mg/dL   Nitrite NEGATIVE NEGATIVE   Leukocytes, UA TRACE (A) NEGATIVE   RBC / HPF 0-5 0 - 5 RBC/hpf   WBC, UA 0-5 0 - 5 WBC/hpf   Bacteria, UA NONE SEEN NONE SEEN   Squamous Epithelial / LPF 0-5 (A) NONE SEEN  Urine rapid drug screen (hosp performed)     Status: None   Collection Time: 10/09/16  1:20 AM  Result Value Ref Range   Opiates NONE DETECTED NONE DETECTED   Cocaine NONE DETECTED NONE DETECTED   Benzodiazepines NONE DETECTED NONE DETECTED   Amphetamines NONE DETECTED NONE DETECTED   Tetrahydrocannabinol NONE DETECTED NONE DETECTED   Barbiturates NONE DETECTED NONE DETECTED    Comment:        DRUG SCREEN FOR MEDICAL PURPOSES ONLY.  IF CONFIRMATION  IS NEEDED FOR ANY PURPOSE, NOTIFY LAB WITHIN 5 DAYS.        LOWEST DETECTABLE LIMITS FOR URINE DRUG SCREEN Drug Class       Cutoff (ng/mL) Amphetamine      1000 Barbiturate      200 Benzodiazepine   756 Tricyclics       433 Opiates          300 Cocaine          300 THC              50   Protein / creatinine ratio, urine     Status: None   Collection Time: 10/09/16  1:20 AM  Result Value Ref Range   Creatinine, Urine 88.78 mg/dL   Total Protein, Urine <6 mg/dL    Comment: NO NORMAL RANGE ESTABLISHED FOR THIS TEST REPEATED TO VERIFY    Protein Creatinine Ratio        0.00 - 0.15 mg/mg[Cre]    Comment: RESULT BELOW REPORTABLE RANGE, UNABLE TO CALCULATE.   Protime-INR     Status: None   Collection Time: 10/09/16 10:41 AM  Result Value Ref Range   Prothrombin Time 13.7 11.4 - 15.2 seconds   INR 1.05   APTT     Status: None   Collection Time: 10/09/16 10:41 AM  Result Value Ref Range   aPTT 26 24 - 36 seconds  CBC     Status: None   Collection Time: 10/09/16 10:41 AM  Result Value Ref Range   WBC 10.3 4.0 - 10.5 K/uL   RBC 4.22 3.87 - 5.11 MIL/uL   Hemoglobin 12.3 12.0 - 15.0 g/dL   HCT 37.4 36.0 - 46.0 %   MCV 88.6 78.0 - 100.0 fL    MCH 29.1 26.0 - 34.0 pg   MCHC 32.9 30.0 - 36.0 g/dL   RDW 14.0 11.5 - 15.5 %   Platelets 214 150 - 400 K/uL  Differential     Status: None   Collection Time: 10/09/16 10:41 AM  Result Value Ref Range   Neutrophils Relative % 62 %   Neutro Abs 6.3 1.7 - 7.7 K/uL   Lymphocytes Relative 30 %   Lymphs Abs 3.1 0.7 - 4.0 K/uL   Monocytes Relative 6 %   Monocytes Absolute 0.6 0.1 - 1.0 K/uL   Eosinophils Relative 2 %   Eosinophils Absolute 0.3 0.0 - 0.7 K/uL   Basophils Relative 0 %   Basophils Absolute 0.0 0.0 - 0.1 K/uL  Comprehensive metabolic panel     Status: Abnormal   Collection Time: 10/09/16 10:41 AM  Result Value Ref Range   Sodium 137 135 - 145 mmol/L   Potassium 4.3 3.5 - 5.1 mmol/L   Chloride 102 101 - 111 mmol/L   CO2 28 22 - 32 mmol/L   Glucose, Bld 268 (H) 65 - 99 mg/dL   BUN 26 (H) 6 - 20 mg/dL   Creatinine, Ser 0.89 0.44 - 1.00 mg/dL   Calcium 9.0 8.9 - 10.3 mg/dL   Total Protein 7.3 6.5 - 8.1 g/dL   Albumin 3.5 3.5 - 5.0 g/dL   AST 19 15 - 41 U/L   ALT 18 14 - 54 U/L   Alkaline Phosphatase 88 38 - 126 U/L   Total Bilirubin 0.7 0.3 - 1.2 mg/dL   GFR calc non Af Amer 59 (L) >60 mL/min   GFR calc Af Amer >60 >60 mL/min    Comment: (NOTE) The eGFR has been  calculated using the CKD EPI equation. This calculation has not been validated in all clinical situations. eGFR's persistently <60 mL/min signify possible Chronic Kidney Disease.    Anion gap 7 5 - 15  I-stat troponin, ED     Status: None   Collection Time: 10/09/16 10:50 AM  Result Value Ref Range   Troponin i, poc 0.00 0.00 - 0.08 ng/mL   Comment 3            Comment: Due to the release kinetics of cTnI, a negative result within the first hours of the onset of symptoms does not rule out myocardial infarction with certainty. If myocardial infarction is still suspected, repeat the test at appropriate intervals.   CBG monitoring, ED     Status: Abnormal   Collection Time: 10/09/16 10:50 AM  Result  Value Ref Range   Glucose-Capillary 247 (H) 65 - 99 mg/dL  Glucose, capillary     Status: Abnormal   Collection Time: 10/09/16  9:24 PM  Result Value Ref Range   Glucose-Capillary 118 (H) 65 - 99 mg/dL   Comment 1 Notify RN    Comment 2 Document in Chart   Lipid panel     Status: None   Collection Time: 10/10/16  6:02 AM  Result Value Ref Range   Cholesterol 125 0 - 200 mg/dL   Triglycerides 80 <150 mg/dL   HDL 48 >40 mg/dL   Total CHOL/HDL Ratio 2.6 RATIO   VLDL 16 0 - 40 mg/dL   LDL Cholesterol 61 0 - 99 mg/dL    Comment:        Total Cholesterol/HDL:CHD Risk Coronary Heart Disease Risk Table                     Men   Women  1/2 Average Risk   3.4   3.3  Average Risk       5.0   4.4  2 X Average Risk   9.6   7.1  3 X Average Risk  23.4   11.0        Use the calculated Patient Ratio above and the CHD Risk Table to determine the patient's CHD Risk.        ATP III CLASSIFICATION (LDL):  <100     mg/dL   Optimal  100-129  mg/dL   Near or Above                    Optimal  130-159  mg/dL   Borderline  160-189  mg/dL   High  >190     mg/dL   Very High   Glucose, capillary     Status: Abnormal   Collection Time: 10/10/16  6:13 AM  Result Value Ref Range   Glucose-Capillary 204 (H) 65 - 99 mg/dL   Comment 1 Notify RN    Comment 2 Document in Chart    Dg Chest 2 View  Result Date: 10/09/2016 CLINICAL DATA:  TIA with a aphasia. EXAM: CHEST  2 VIEW COMPARISON:  11/10/2014 FINDINGS: The heart size and mediastinal contours are within normal limits. There is no evidence of pulmonary edema, consolidation, pneumothorax, nodule or pleural fluid. The thoracic spine shows stable degenerative disc disease. IMPRESSION: No active cardiopulmonary disease. Electronically Signed   By: Aletta Edouard M.D.   On: 10/09/2016 11:19   Ct Head Wo Contrast  Result Date: 10/09/2016 CLINICAL DATA:  Aphasia 2 days ago, generalized lower extremity weakness and left wrist pain,  neck pain. EXAM: CT HEAD  WITHOUT CONTRAST TECHNIQUE: Contiguous axial images were obtained from the base of the skull through the vertex without intravenous contrast. COMPARISON:  08/25/2015. FINDINGS: Brain: No evidence of an acute infarct, acute hemorrhage, mass lesion, mass effect or hydrocephalus. Atrophy. Mild periventricular low attenuation. Vascular: No hyperdense vessel or unexpected calcification. Skull: Normal. Negative for fracture or focal lesion. Sinuses/Orbits: No acute finding. Other: None. IMPRESSION: 1. No acute intracranial abnormality. 2. Atrophy and chronic microvascular white matter ischemic changes. Electronically Signed   By: Lorin Picket M.D.   On: 10/09/2016 11:23   Mr Brain Wo Contrast  Result Date: 10/10/2016 CLINICAL DATA:  Dysarthria EXAM: MRI HEAD WITHOUT CONTRAST MRA HEAD WITHOUT CONTRAST TECHNIQUE: Multiplanar, multiecho pulse sequences of the brain and surrounding structures were obtained without intravenous contrast. Angiographic images of the head were obtained using MRA technique without contrast. COMPARISON:  None. FINDINGS: MRI HEAD FINDINGS Brain: No focal diffusion restriction to indicate acute infarct. No intraparenchymal hemorrhage. Small remote infarct within the right frontal white matter. Mild periventricular hyperintense T2 weighted signal. No mass lesion or midline shift. No hydrocephalus or extra-axial fluid collection. The midline structures are normal. No age advanced or lobar predominant atrophy. Vascular: Major intracranial arterial and venous sinus flow voids are preserved. No evidence of chronic microhemorrhage or amyloid angiopathy. Skull and upper cervical spine: The visualized skull base, calvarium, upper cervical spine and extracranial soft tissues are normal. Sinuses/Orbits: No fluid levels or advanced mucosal thickening. No mastoid effusion. Normal orbits. MRA HEAD FINDINGS Intracranial internal carotid arteries: Normal. Anterior cerebral arteries: Normal. Middle cerebral  arteries: Normal. Posterior communicating arteries: Present bilaterally. Posterior cerebral arteries: Normal. Basilar artery: Normal. Vertebral arteries: Codominant. Normal. Superior cerebellar arteries: Normal. Anterior inferior cerebellar arteries: Normal on the right. Not clearly visualized on the left. Posterior inferior cerebellar arteries: Normal. IMPRESSION: 1. No acute intracranial abnormality. 2. Old, small right frontal infarct and findings of mild chronic microvascular ischemia. 3. No intracranial arterial occlusion, aneurysm or high-grade stenosis. Electronically Signed   By: Ulyses Jarred M.D.   On: 10/10/2016 03:14   Dg Shoulder Left  Result Date: 10/09/2016 CLINICAL DATA:  Anterior left shoulder pain radiating to the neck EXAM: LEFT SHOULDER - 2+ VIEW COMPARISON:  None. FINDINGS: Mild AC joint degenerative change. No acute fracture or dislocation is visualized. Left lung apex clear. Atherosclerotic calcifications of the aortic arch. IMPRESSION: No acute osseous abnormality Electronically Signed   By: Donavan Foil M.D.   On: 10/09/2016 20:23   [name] Head/brain VP Cm  Result Date: 10/10/2016 CLINICAL DATA:  Dysarthria EXAM: MRI HEAD WITHOUT CONTRAST MRA HEAD WITHOUT CONTRAST TECHNIQUE: Multiplanar, multiecho pulse sequences of the brain and surrounding structures were obtained without intravenous contrast. Angiographic images of the head were obtained using MRA technique without contrast. COMPARISON:  None. FINDINGS: MRI HEAD FINDINGS Brain: No focal diffusion restriction to indicate acute infarct. No intraparenchymal hemorrhage. Small remote infarct within the right frontal white matter. Mild periventricular hyperintense T2 weighted signal. No mass lesion or midline shift. No hydrocephalus or extra-axial fluid collection. The midline structures are normal. No age advanced or lobar predominant atrophy. Vascular: Major intracranial arterial and venous sinus flow voids are preserved. No evidence  of chronic microhemorrhage or amyloid angiopathy. Skull and upper cervical spine: The visualized skull base, calvarium, upper cervical spine and extracranial soft tissues are normal. Sinuses/Orbits: No fluid levels or advanced mucosal thickening. No mastoid effusion. Normal orbits. MRA HEAD FINDINGS Intracranial internal carotid arteries: Normal. Anterior  cerebral arteries: Normal. Middle cerebral arteries: Normal. Posterior communicating arteries: Present bilaterally. Posterior cerebral arteries: Normal. Basilar artery: Normal. Vertebral arteries: Codominant. Normal. Superior cerebellar arteries: Normal. Anterior inferior cerebellar arteries: Normal on the right. Not clearly visualized on the left. Posterior inferior cerebellar arteries: Normal. IMPRESSION: 1. No acute intracranial abnormality. 2. Old, small right frontal infarct and findings of mild chronic microvascular ischemia. 3. No intracranial arterial occlusion, aneurysm or high-grade stenosis. Electronically Signed   By: Ulyses Jarred M.D.   On: 10/10/2016 03:14    Assessment: 83 y.o. female with a 2 day history of dysarthria. No dysarthria noted on current examination. No lateralizing Neurological findings.  1. MRI brain reveals no acute intracranial abnormality. An old, small right frontal infarct and findings of mild chronic microvascular ischemia are noted. 2. MRA head reveals no intracranial arterial occlusion, aneurysm or high-grade stenosis.  Plan: 1. Carotid ultrasound.  2. TTE.  3. Continue ASA if she can tolerate (prior history of stomach pain with ASA). If unable to tolerate ASA, switch to Plavix.  4. Unclear if statin benefits would outweigh risks given her advanced age and diffuse fatigueability.  5. HgbA1c, fasting lipid panel 6. BP management. Out of permissive HTN time window.  7. PT consult, OT consult, Speech consult 8. Telemetry monitoring 9. Frequent neuro checks   _0  signed: Dr. Kerney Elbe  10/10/2016,  7:18 AM

## 2016-10-10 NOTE — Progress Notes (Signed)
Inpatient Diabetes Program Recommendations  AACE/ADA: New Consensus Statement on Inpatient Glycemic Control (2015)  Target Ranges:  Prepandial:   less than 140 mg/dL      Peak postprandial:   less than 180 mg/dL (1-2 hours)      Critically ill patients:  140 - 180 mg/dL   Lab Results  Component Value Date   GLUCAP 212 (H) 10/10/2016   HGBA1C 8.9 06/07/2016    Review of Glycemic Control  Diabetes history: DM2 Outpatient Diabetes medications: U-500 60-50-50 Current orders for Inpatient glycemic control: Novolog 4 units tid meal coverage+ Novolog correction 0-20 units tid+ 0-5 units hs  Inpatient Diabetes Program Recommendations:    Please consider portion of basal insulin Levemir 10 units daily to start and titrate as needed (0.1 unit/kg x 107.6 kg).  Thank you, Nani Gasser. Steffen Hase, RN, MSN, CDE Inpatient Glycemic Control Team Team Pager [phone] (8am-5pm) 10/10/2016 12:09 PM

## 2016-10-10 NOTE — Progress Notes (Addendum)
STROKE TEAM PROGRESS NOTE   HISTORY OF PRESENT ILLNESS (per record) Melanie Mcdonald is an 83 y.o. female who presented to [facility] ED with c/c of dysarthria, beginning 2 days previously. Her son stated that at about 28 AM on 1/6 (LKW) he noticed dysarthric speech while speaking with her on the telephone. He asked a friend to check on her and the friend also noted dysarthria, calling EMS. The patient refused to go to the ED at that point and over the next 24 hours her dysarthria improved slightly. On 1/8 she finally decided to be seen in the ED, as her PCP had no appointments available. In the ED she stated that her speech was slow. At that time she denied vision loss, headache, sensory changes or focal weakness. At time of Neurology evaluation she is still negative for the above, except for 2/10 frontal headache and "blurred vision" that is chronic. She denies CP, abdominal pain or limb pain except for left shoulder pain that is chronic. She endorses SOB with exertion.   Her PMHx includes HTN, DM, HLD, GERD and COPD.  CT of the brain was negative. EKG showed sinus rhythm with nonspecific ST changes.  Patient was not administered IV t-PA. She was admitted for further evaluation and treatment.   SUBJECTIVE (INTERVAL HISTORY) No family is at the bedside.  She is lying in the bed. reports hx of stomach upset on aspirin. Overall she feels her condition is stable. She states she has had recurrent episodes of transient garbled speech and difficulty speaking lasting 10 minutes. She is a total of 4 episodes over the last 8 years.   OBJECTIVE Temp:  [98 F (36.7 C)-98.4 F (36.9 C)] 98.1 F (36.7 C) (01/09 0715) Pulse Rate:  [66-90] 72 (01/09 0715) Cardiac Rhythm: Normal sinus rhythm (01/09 0715) Resp:  [12-19] 16 (01/09 0715) BP: (124-163)/(50-91) 142/57 (01/09 0715) SpO2:  [92 %-100 %] 95 % (01/09 0715) Weight:  [107.6 kg (237 lb 3.4 oz)-108.9 kg (240 lb)] 107.6 kg (237 lb 3.4 oz) (01/08 1924)  CBC:   Recent Labs Lab 10/09/16 1041  WBC 10.3  NEUTROABS 6.3  HGB 12.3  HCT 37.4  MCV 88.6  PLT Q000111Q    Basic Metabolic Panel:  Recent Labs Lab 10/09/16 1041  NA 137  K 4.3  CL 102  CO2 28  GLUCOSE 268*  BUN 26*  CREATININE 0.89  CALCIUM 9.0    Lipid Panel:    Component Value Date/Time   CHOL 125 10/10/2016 0602   CHOL 160 12/21/2014 1153   TRIG 80 10/10/2016 0602   TRIG 62 12/21/2014 1153   HDL 48 10/10/2016 0602   HDL 74 12/21/2014 1153   CHOLHDL 2.6 10/10/2016 0602   VLDL 16 10/10/2016 0602   LDLCALC 61 10/10/2016 0602   LDLCALC 74 12/21/2014 1153   HgbA1c:  Lab Results  Component Value Date   HGBA1C 8.9 06/07/2016   Urine Drug Screen:    Component Value Date/Time   LABOPIA NONE DETECTED 10/09/2016 0120   COCAINSCRNUR NONE DETECTED 10/09/2016 0120   LABBENZ NONE DETECTED 10/09/2016 0120   AMPHETMU NONE DETECTED 10/09/2016 0120   THCU NONE DETECTED 10/09/2016 0120   LABBARB NONE DETECTED 10/09/2016 0120      IMAGING  Ct Head Wo Contrast 10/09/2016 1. No acute intracranial abnormality. 2. Atrophy and chronic microvascular white matter ischemic changes.   Mr Brain Wo Contrast [name] Head/brain Wo Cm 10/10/2016 1. No acute intracranial abnormality. 2. Old, small right frontal  infarct and findings of mild chronic microvascular ischemia. 3. No intracranial arterial occlusion, aneurysm or high-grade stenosis.    PHYSICAL EXAM Pleasant elderly Caucasian lady not in distress. . Afebrile. Head is nontraumatic. Neck is supple without bruit.    Cardiac exam no murmur or gallop. Lungs are clear to auscultation. Distal pulses are well felt. Neurological Exam ;  Awake  Alert oriented x 3. Normal speech and language.eye movements full without nystagmus.fundi were not visualized. Vision acuity and fields appear normal. Hearing is normal. Palatal movements are normal. Face symmetric. Tongue midline. Normal strength, tone, reflexes and coordination. Normal sensation. Gait  deferred.  ASSESSMENT/PLAN [name] is a 83 y.o. female with history of HTN, DM, HLD, GERD and COPD presenting with dysarthric speech. She did not receive IV t-PA due to delay in arrival.   Possible TIA  MRI  No acute stroke  MRA  Unremarkable   Carotid Doppler  pending   2D Echo  pending   LE doppler pending   LDL 61  HgbA1c pending  Lovenox 40 mg sq daily for VTE prophylaxis  Diet Carb Modified Fluid consistency: Thin; Room service appropriate? Yes  No antithrombotic prior to admission, jaspirin suppository d/c'd by nurse. Patient with hx GI upset on aspirin. Ok to start E asa 81 mg for secondary stroke prevention. Have discussed with her.   Patient counseled to be compliant with her antithrombotic medications  Ongoing aggressive stroke risk factor management  Therapy recommendations:  pending   Disposition:  pending   Hypertension  Stable  Permissive hypertension (OK if < 220/120) but gradually normalize in 5-7 days  Long-term BP goal normotensive  Diabetes type II Diabetic neurolopathy  HgbA1c pending, goal < 7.0  Other Stroke Risk Factors  Advanced age  Obesity, Body mass index is 37.15 kg/m., recommend weight loss, diet and exercise as appropriate   Hx TIA - no hx of workup  Coronary artery disease  PVD  Other Active Problems  LE edema  Gait instability  Hospital day # 0  Radene Journey [facility] [place] for Pager information 10/10/2016 2:39 PM  I have personally examined this patient, reviewed notes, independently viewed imaging studies, participated in medical decision making and plan of care.ROS completed by me personally and pertinent positives fully documented  I have made any additions or clarifications directly to the above note. Agree with note above. She had recurrent episodes of transient garbled speech and word finding difficulties possible left hemispheric TIAs. Continue ongoing stroke workup. Recommend  coated aspirin for stroke prevention. Discussed risk benefits with patient and she is in agreement. Greater than 50% time during this 35 minute visit was spent on counseling and coordination of care about TIA and stroke risk, prevention and treatment  Antony Contras, MD Medical Director [facility] Pager: [phone] 10/10/2016 4:00 PM  To contact Stroke Continuity provider, please refer to [email or web address] After hours, contact General Neurology

## 2016-10-11 ENCOUNTER — Observation Stay (HOSPITAL_COMMUNITY): Payer: Medicare Other

## 2016-10-11 ENCOUNTER — Observation Stay (HOSPITAL_BASED_OUTPATIENT_CLINIC_OR_DEPARTMENT_OTHER): Payer: Medicare Other

## 2016-10-11 DIAGNOSIS — G459 Transient cerebral ischemic attack, unspecified: Secondary | ICD-10-CM

## 2016-10-11 DIAGNOSIS — R2681 Unsteadiness on feet: Secondary | ICD-10-CM | POA: Diagnosis not present

## 2016-10-11 DIAGNOSIS — R471 Dysarthria and anarthria: Secondary | ICD-10-CM | POA: Diagnosis not present

## 2016-10-11 DIAGNOSIS — I10 Essential (primary) hypertension: Secondary | ICD-10-CM | POA: Diagnosis not present

## 2016-10-11 LAB — GLUCOSE, CAPILLARY
Glucose-Capillary: 209 mg/dL — ABNORMAL HIGH (ref 65–99)
Glucose-Capillary: 211 mg/dL — ABNORMAL HIGH (ref 65–99)
Glucose-Capillary: 207 mg/dL — ABNORMAL HIGH (ref 65–99)

## 2016-10-11 LAB — ECHOCARDIOGRAM COMPLETE
HEIGHTINCHES: 67 in
WEIGHTICAEL: 3795.44 [oz_av]

## 2016-10-11 LAB — HEMOGLOBIN A1C
Hgb A1c MFr Bld: 9.6 % — ABNORMAL HIGH (ref 4.8–5.6)
Mean Plasma Glucose: 229 mg/dL

## 2016-10-11 LAB — ALDOLASE: ALDOLASE: 4.6 U/L (ref 3.3–10.3)

## 2016-10-11 LAB — URINE CULTURE: Culture: <10000 — AB

## 2016-10-11 MED ORDER — INSULIN DETEMIR 100 UNIT/ML ~~LOC~~ SOLN
30.0000 [IU] | Freq: Every day | SUBCUTANEOUS | Status: DC
  Administered 2016-10-11: 28 [IU] via SUBCUTANEOUS
  Filled 2016-10-11: qty 0.3

## 2016-10-11 MED ORDER — ASPIRIN 81 MG PO TBEC: 81.0000 mg | DELAYED_RELEASE_TABLET | Freq: Every day | ORAL | 1 refills | Status: DC

## 2016-10-11 MED ORDER — SENNOSIDES-DOCUSATE SODIUM 8.6-50 MG PO TABS: 1.0000 | ORAL_TABLET | Freq: Every evening | ORAL | 1 refills | Status: AC | PRN

## 2016-10-11 MED ORDER — PRAMIPEXOLE DIHYDROCHLORIDE 1 MG PO TABS: ORAL_TABLET | ORAL | 1 refills | Status: DC

## 2016-10-11 MED ORDER — LORAZEPAM 1 MG PO TABS: 1.0000 mg | ORAL_TABLET | Freq: Two times a day (BID) | ORAL | 2 refills | Status: DC

## 2016-10-11 MED ORDER — PRAMIPEXOLE DIHYDROCHLORIDE 1 MG PO TABS
5.0000 mg | ORAL_TABLET | Freq: Every day | ORAL | Status: DC
  Administered 2016-10-11: 5 mg via ORAL
  Filled 2016-10-11: qty 5

## 2016-10-11 MED ORDER — INSULIN DETEMIR 100 UNIT/ML FLEXPEN: 30.0000 [IU] | PEN_INJECTOR | Freq: Every day | SUBCUTANEOUS | 1 refills | Status: DC

## 2016-10-11 MED ORDER — PERFLUTREN LIPID MICROSPHERE
1.0000 mL | INTRAVENOUS | Status: AC | PRN
  Administered 2016-10-11: 2 mL via INTRAVENOUS
  Filled 2016-10-11 (×2): qty 10

## 2016-10-11 MED ORDER — INSULIN REGULAR HUMAN (CONC) 500 UNIT/ML ~~LOC~~ SOPN: 30.0000 [IU] | PEN_INJECTOR | Freq: Three times a day (TID) | SUBCUTANEOUS | 12 refills | Status: DC

## 2016-10-11 NOTE — Progress Notes (Signed)
Occupational Therapy Treatment Patient Details Name: Melanie Mcdonald MRN: [id number] DOB: [birth year] Today's Date: 10/11/2016    History of present illness Pt is an 83 y.o.femalewho presented to [facility] ED with c/c of dysarthria, beginning 2 days previously. The pt initially refused to go to the ED and over the next 24 hours her dysarthria improved slightly. On 1/8 she finally decided to be seen in the ED, as her PCP had no appointments available. In the ED she stated that her speech was slow. At time of Neurology evaluation she reported 2/10 frontal headache and "blurred vision" that is chronic.    OT comments  Pt progressing toward OT goals. Moving slowly this session and reporting slight dizziness and nausea after mobility. Pt reports that this occurs regularly for her and began a few months ago. She was able to complete toilet transfer with min guard assist, standing grooming tasks with supervision, and LB dressing with min assist. Educated pt on energy conservation techniques and use of reacher for LB dressing tasks. She demonstrates understanding. OT will continue to follow acutely.   Follow Up Recommendations  Home health OT;Supervision/Assistance - 24 hour    Equipment Recommendations  3 in 1 bedside commode    Recommendations for Other Services      Precautions / Restrictions Precautions Precautions: Fall Precaution Comments: Pt reports falls at home Restrictions Weight Bearing Restrictions: No       Mobility Bed Mobility Overal bed mobility: Needs Assistance Bed Mobility: Rolling;Sidelying to Sit Rolling: Min guard Sidelying to sit: Min guard          Transfers Overall transfer level: Needs assistance Equipment used: Rolling walker (2 wheeled) Transfers: Sit to/from Stand Sit to Stand: Min guard              Balance Overall balance assessment: Needs assistance Sitting-balance support: Feet supported;No upper extremity supported Sitting balance-Leahy Scale:  Fair     Standing balance support: No upper extremity supported Standing balance-Leahy Scale: Fair Standing balance comment: Able to stand without UE support at sink for grooming tasks.                   ADL Overall ADL's : Needs assistance/impaired     Grooming: Supervision/safety;Standing;Wash/dry hands               Lower Body Dressing: Minimal assistance;Sit to/from stand   Toilet Transfer: Min guard;BSC;RW (BSC over toilet)   Toileting- Clothing Manipulation and Hygiene: Supervision/safety;Sit to/from stand       Functional mobility during ADLs: Min guard;Rolling walker General ADL Comments: Pt educated on safe toilet transfers and is completing mobility and ADL with decreased assist. Pt moving slowly; however.      Vision                     Perception     Praxis      Cognition   Behavior During Therapy: WFL for tasks assessed/performed Overall Cognitive Status: Within Functional Limits for tasks assessed                       Extremity/Trunk Assessment               Exercises     Shoulder Instructions       General Comments      Pertinent Vitals/ Pain       Pain Assessment: No/denies pain  Home Living     Available Help at Discharge: Family;Available  PRN/intermittently Type of Home: House                                  Prior Functioning/Environment              Frequency  Min 2X/week        Progress Toward Goals  OT Goals(current goals can now be found in the care plan section)  Progress towards OT goals: Progressing toward goals  Acute Rehab OT Goals Patient Stated Goal: to get back to everyday routine. OT Goal Formulation: With patient Time For Goal Achievement: 10/17/16 Potential to Achieve Goals: Good ADL Goals Pt Will Perform Lower Body Bathing: with modified independence;with adaptive equipment;sit to/from stand Pt Will Perform Lower Body Dressing: with modified  independence;with adaptive equipment;sit to/from stand Pt Will Transfer to Toilet: with modified independence;ambulating;bedside commode (BSC over toilet) Pt Will Perform Toileting - Clothing Manipulation and hygiene: with modified independence;sit to/from stand Pt Will Perform Tub/Shower Transfer: with supervision;rolling walker;Shower transfer;3 in 1;shower seat;Tub transfer Additional ADL Goal #1: Pt will identify and incorporate 3 strategies to conserve energy during daily routine.  Plan Discharge plan remains appropriate    Co-evaluation                 End of Session Equipment Utilized During Treatment: Gait belt;Rolling walker   Activity Tolerance Patient tolerated treatment well   Patient Left with call bell/phone within reach;in bed;with family/visitor present   Nurse Communication      Functional Assessment Tool Used: clinical judgement Functional Limitation: Self care Self Care Current Status [id number]): At least 1 percent but less than 20 percent impaired, limited or restricted Self Care Goal Status [id number]): At least 1 percent but less than 20 percent impaired, limited or restricted   Time: [id number] OT Time Calculation (min): 25 min  Charges: OT G-codes **NOT FOR INPATIENT CLASS** Functional Assessment Tool Used: clinical judgement Functional Limitation: Self care Self Care Current Status [id number]): At least 1 percent but less than 20 percent impaired, limited or restricted Self Care Goal Status [id number]): At least 1 percent but less than 20 percent impaired, limited or restricted OT General Charges $OT Visit: 1 Procedure OT Treatments $Self Care/Home Management : 23-37 mins  Norman Herrlich, OTR/L [phone] 10/11/2016, 5:28 PM

## 2016-10-11 NOTE — Progress Notes (Signed)
STROKE TEAM PROGRESS NOTE   HISTORY OF PRESENT ILLNESS (per record) Melanie Mcdonald is an 83 y.o. female who presented to [facility] ED with c/c of dysarthria, beginning 2 days previously. Her son stated that at about 42 AM on 1/6 (LKW) he noticed dysarthric speech while speaking with her on the telephone. He asked a friend to check on her and the friend also noted dysarthria, calling EMS. The patient refused to go to the ED at that point and over the next 24 hours her dysarthria improved slightly. On 1/8 she finally decided to be seen in the ED, as her PCP had no appointments available. In the ED she stated that her speech was slow. At that time she denied vision loss, headache, sensory changes or focal weakness. At time of Neurology evaluation she is still negative for the above, except for 2/10 frontal headache and "blurred vision" that is chronic. She denies CP, abdominal pain or limb pain except for left shoulder pain that is chronic. She endorses SOB with exertion.   Her PMHx includes HTN, DM, HLD, GERD and COPD.  CT of the brain was negative. EKG showed sinus rhythm with nonspecific ST changes.  Patient was not administered IV t-PA. She was admitted for further evaluation and treatment.   SUBJECTIVE (INTERVAL HISTORY) No family is at the bedside.  She is lying in the bed. . Overall she feels her condition is stable. She states she has not  had any recurrent episodes of transient garbled speech and difficulty speaking  OBJECTIVE Temp:  [98 F (36.7 C)-99.6 F (37.6 C)] 98.3 F (36.8 C) (01/10 0955) Pulse Rate:  [71-87] 71 (01/10 0955) Cardiac Rhythm: Normal sinus rhythm (01/10 0701) Resp:  [16-20] 16 (01/10 0955) BP: (116-167)/(50-75) 116/50 (01/10 0955) SpO2:  [95 %-100 %] 95 % (01/10 0955)  CBC:   Recent Labs Lab 10/09/16 1041  WBC 10.3  NEUTROABS 6.3  HGB 12.3  HCT 37.4  MCV 88.6  PLT Q000111Q    Basic Metabolic Panel:   Recent Labs Lab 10/09/16 1041  NA 137  K 4.3  CL 102   CO2 28  GLUCOSE 268*  BUN 26*  CREATININE 0.89  CALCIUM 9.0    Lipid Panel:     Component Value Date/Time   CHOL 125 10/10/2016 0602   CHOL 160 12/21/2014 1153   TRIG 80 10/10/2016 0602   TRIG 62 12/21/2014 1153   HDL 48 10/10/2016 0602   HDL 74 12/21/2014 1153   CHOLHDL 2.6 10/10/2016 0602   VLDL 16 10/10/2016 0602   LDLCALC 61 10/10/2016 0602   LDLCALC 74 12/21/2014 1153   HgbA1c:  Lab Results  Component Value Date   HGBA1C 9.6 (H) 10/10/2016   Urine Drug Screen:     Component Value Date/Time   LABOPIA NONE DETECTED 10/09/2016 0120   COCAINSCRNUR NONE DETECTED 10/09/2016 0120   LABBENZ NONE DETECTED 10/09/2016 0120   AMPHETMU NONE DETECTED 10/09/2016 0120   THCU NONE DETECTED 10/09/2016 0120   LABBARB NONE DETECTED 10/09/2016 0120      IMAGING  Ct Head Wo Contrast 10/09/2016 1. No acute intracranial abnormality. 2. Atrophy and chronic microvascular white matter ischemic changes.   Mr Brain Wo Contrast [name] Head/brain Wo Cm 10/10/2016 1. No acute intracranial abnormality. 2. Old, small right frontal infarct and findings of mild chronic microvascular ischemia. 3. No intracranial arterial occlusion, aneurysm or high-grade stenosis.   Carotid Doppler   There is 1-39% bilateral ICA stenosis. Vertebral artery flow is  antegrade.     Lower Ext. Venous Duplex  Completed. No evidence of deep or superficial vein thrombosis noted in both legs.    PHYSICAL EXAM Pleasant elderly Caucasian lady not in distress. . Afebrile. Head is nontraumatic. Neck is supple without bruit.    Cardiac exam no murmur or gallop. Lungs are clear to auscultation. Distal pulses are well felt. Neurological Exam ;  Awake  Alert oriented x 3. Normal speech and language.eye movements full without nystagmus.fundi were not visualized. Vision acuity and fields appear normal. Hearing is normal. Palatal movements are normal. Face symmetric. Tongue midline. Normal strength, tone, reflexes and  coordination. Normal sensation. Gait deferred.  ASSESSMENT/PLAN Melanie Mcdonald is a 83 y.o. female with history of HTN, DM, HLD, GERD and COPD presenting with dysarthric speech. She did not receive IV t-PA due to delay in arrival.   Possible TIA  MRI  No acute stroke  MRA  Unremarkable   Carotid Doppler  No significant stenosis  2D Echo  pending   LE doppler negative DVT  LDL 61  HgbA1c pending  Lovenox 40 mg sq daily for VTE prophylaxis Diet Carb Modified Fluid consistency: Thin; Room service appropriate? Yes  No antithrombotic prior to admission, jaspirin suppository d/c'd by nurse. Patient with hx GI upset on aspirin. Ok to start E asa 81 mg for secondary stroke prevention. Have discussed with her.   Patient counseled to be compliant with her antithrombotic medications  Ongoing aggressive stroke risk factor management  Therapy recommendations:  pending   Disposition:  pending   Hypertension  Stable  Permissive hypertension (OK if < 220/120) but gradually normalize in 5-7 days  Long-term BP goal normotensive  Diabetes type II Diabetic neurolopathy  HgbA1c pending, goal < 7.0  Other Stroke Risk Factors  Advanced age  Obesity, Body mass index is 37.15 kg/m., recommend weight loss, diet and exercise as appropriate   Hx TIA - no hx of workup  Coronary artery disease  PVD  Other Active Problems  LE edema  Gait instability  RLS - severe - she IS on 5MG  MIRAPEX daily. This is the only dose that works. Has tried to cut down, but unable to tolerate. Shellye Zandi discussed with pt. Will continue.  Hospital day # 0  Radene Journey [facility] [place] for Pager information 10/11/2016 10:35 AM  I have personally examined this patient, reviewed notes, independently viewed imaging studies, participated in medical decision making and plan of care.ROS completed by me personally and pertinent positives fully documented  I have made any additions or  clarifications directly to the above note. Agree with note above. She had recurrent episodes of transient garbled speech and word finding difficulties possible left hemispheric TIAs. Continue ongoing stroke workup. Recommend coated aspirin for stroke prevention. Discussed risk benefits with patient and she is in agreement. Follow echocardiogram results. Greater than 50% time during this 25 minute visit was spent on counseling and coordination of care about TIA and stroke risk, prevention and treatment. Stroke team will sign off. Kindly call for questions. Outpatient follow-up in the stroke clinic in 6 weeks Antony Contras, MD Medical Director [facility] Pager: [phone] 10/11/2016 10:35 AM  To contact Stroke Continuity provider, please refer to [email or web address] After hours, contact General Neurology

## 2016-10-11 NOTE — Evaluation (Signed)
Speech Language Pathology Evaluation Patient Details Name: Melanie Mcdonald MRN: [id number] DOB: 1932/10/27 Today's Date: 10/11/2016 Time: [id number] SLP Time Calculation (min) (ACUTE ONLY): 13 min  Problem List:  Patient Active Problem List   Diagnosis Date Noted  . Uncontrolled type 2 diabetes mellitus with hyperglycemia, with long-term current use of insulin ([place]) 10/09/2016  . Dysarthria 10/09/2016  . Cellulitis of right foot 06/13/2016  . Gout 06/07/2016  . Leg swelling 04/05/2016  . Pain in joint, ankle and foot 12/24/2015  . Diabetic polyneuropathy associated with diabetes mellitus due to underlying condition ([place]) 06/14/2015  . Cervicogenic headache 06/14/2015  . Weakness generalized 11/13/2014  . TIA (transient ischemic attack) 11/13/2014  . Gait instability 11/13/2014  . Acute on chronic diastolic CHF (congestive heart failure), NYHA class 3 ([place]) 11/03/2014  . Obstructive apnea 11/03/2014  . DOE (dyspnea on exertion) 11/03/2014  . Diabetes mellitus without complication ([place]) [id number]  . Thyroid nodule 04/28/2014  . Personal history of colonic polyps 03/09/2014  . Abdominal pain, unspecified site 03/09/2014  . Dizziness 02/10/2014  . Memory loss 12/30/2013  . Headache 12/30/2013  . Abdominal pain, chronic, right lower quadrant 06/03/2013  . Hypokalemia 08/15/2011  . Diarrhea 08/15/2011  . Cellulitis of left leg 08/14/2011  . Chest pain 06/08/2011  . HIP PAIN 09/15/2010  . CYSTOCELE WITHOUT MENTION UTERINE PROLAPSE LAT 09/12/2010  . Labyrinthitis 11/03/2009  . TRANSIENT ISCHEMIC ATTACK 10/20/2009  . NEPHROLITHIASIS 04/29/2009  . Peripheral vascular disease ([place]) 01/26/2009  . IRRITABLE BOWEL SYNDROME 01/26/2009  . Edema 09/02/2008  . Type 2 diabetes mellitus ([place]) 08/26/2008  . Hyperlipidemia 08/26/2008  . NARCOLEPSY WITHOUT CATAPLEXY 08/26/2008  . HYPOGLYCEMIA 06/23/2008  . Backache 06/23/2008  . Arthropathy 12/11/2007  . Insomnia 12/11/2007  . Esophageal  reflux 11/06/2007  . Osteoarthritis 08/07/2007  . OBESITY 03/01/2007  . DEPRESSION 03/01/2007  . SYNDROME, RESTLESS LEGS 03/01/2007  . Essential hypertension 03/01/2007  . Coronary atherosclerosis 03/01/2007   Past Medical History:  Past Medical History:  Diagnosis Date  . ANEMIA 01/30/2008  . ARTHRITIS 12/11/2007  . BACK PAIN, CHRONIC 06/23/2008  . BREAST CYST, RIGHT 12/17/2007  . Colon polyps    FRAGMENTS OF HYPERPLASTIC POLYP  . CONTACT DERMATITIS 03/10/2009  . COPD (chronic obstructive pulmonary disease) ([place])   . CORONARY ARTERY DISEASE 03/01/2007   had a normal Myoview stress test 07-13-11  . CYSTOCELE WITHOUT MENTION UTERINE PROLAPSE LAT 09/12/2010  . DEGENERATIVE JOINT DISEASE 08/07/2007  . DEPRESSION 03/01/2007  . DIABETES MELLITUS, TYPE II 08/26/2008   sees Dr. Dwyane Dee   . Diverticulosis of colon (without mention of hemorrhage)   . Esophageal reflux 11/06/2007  . HYPERKERATOSIS 06/02/2009  . HYPERLIPIDEMIA 08/26/2008  . HYPERTENSION 03/01/2007  . Irritable bowel syndrome 01/26/2009  . LABYRINTHITIS 11/03/2009  . 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 NEPHROLITHIASIS 04/29/2009  . OBESITY 03/01/2007  . PERIPHERAL VASCULAR DISEASE 01/26/2009  . SYNCOPE 08/26/2008   had brain MRI on 06-28-12 showing only chronic microvascular ischemia and atrophy   . TRANSIENT ISCHEMIC ATTACK 10/20/2009   had normal brain MRA with patent vertebrals and carotids 06-28-12   Past Surgical History:  Past Surgical History:  Procedure Laterality Date  . ABDOMINAL HYSTERECTOMY    . BACK SURGERY     x2  . CARDIAC CATHETERIZATION  07/2009  . CATARACT EXTRACTION    . COLONOSCOPY  11-13-07   per Dr. Deatra Ina, benign polyps, repeat in 5 yrs  . ESOPHAGOGASTRODUODENOSCOPY  11-13-07   per Dr. Deatra Ina, normal   .  INTRAOCULAR LENS INSERTION    . SPINE SURGERY     x 3  . TONSILECTOMY, ADENOIDECTOMY, BILATERAL MYRINGOTOMY AND TUBES     HPI:  Melanie Mcdonald an 83 y.o.femalewho presented to [facility] ED  with c/c of dysarthria, beginning 2 days previously. Her son stated that at about 9 AM on 1/6 ([facility]KW) he noticed dysarthric speech while speaking with her on the telephone. He asked a friend to check on her and the friend also noted dysarthria, calling EMS. The patient refused to go to the ED at that point and over the next 24 hours her dysarthria improved slightly. On 1/8 she finally decided to be seen in the ED, as her PCP had no appointments available. In the ED she stated that her speech was slow. At that time she denied vision loss, headache, sensory changes or focal weakness. At time of Neurology evaluation she is still negative for the above, except for 2/10 frontal headache and "blurred vision" that is chronic. She denies CP, abdominal pain or limb pain except for left shoulder pain that is chronic. She endorses SOB with exertion.  MRI on 10/10/16 revealed no acute intracranial abnormality, old, small right frontal infarct and findings of mild chronic   Assessment / Plan / Recommendation Clinical Impression  Pt presetnw tih mild flaccid dysarthria which decreased her speech intelligibility at the simple conversation level to ~80%. ST would recommedn HHST to assess for further follow-up if needed. Pt able to communicate wants and needs functionally within this setting. ST to sign off.     SLP Assessment  Patient does not need any further Speech Lanaguage Pathology Services    Follow Up Recommendations  Home health SLP          SLP Evaluation Cognition  Overall Cognitive Status: Within Functional Limits for tasks assessed Arousal/Alertness: Awake/alert Orientation Level: Oriented X4 Attention: Sustained       Comprehension  Auditory Comprehension Overall Auditory Comprehension: Appears within functional limits for tasks assessed Yes/No Questions: Within Functional Limits Commands: Within Functional Limits Conversation: Simple    Expression Expression Primary Mode of Expression:  Verbal Verbal Expression Overall Verbal Expression: Impaired Initiation: No impairment Level of Generative/Spontaneous Verbalization: Conversation Repetition: No impairment Naming: No impairment Pragmatics: No impairment Interfering Components: Speech intelligibility Effective Techniques: Articulatory cues Non-Verbal Means of Communication: Not applicable Written Expression Dominant Hand: Right Written Expression: Not tested   Oral / Motor  Oral Motor/Sensory Function Overall Oral Motor/Sensory Function: Mild impairment Facial ROM: Reduced left Facial Symmetry: Abnormal symmetry left Facial Strength: Reduced left Facial Sensation: Within Functional Limits Lingual ROM: Within Functional Limits Lingual Symmetry: Within Functional Limits Lingual Strength: Within Functional Limits Lingual Sensation: Within Functional Limits Velum: Within Functional Limits Mandible: Within Functional Limits Motor Speech Overall Motor Speech: Appears within functional limits for tasks assessed Respiration: Within functional limits Phonation: Normal Resonance: Within functional limits Articulation: Impaired Level of Impairment: Conversation Intelligibility: Intelligibility reduced Word: 75-100% accurate Phrase: 75-100% accurate Sentence: 75-100% accurate Conversation: 75-100% accurate Motor Planning: Witnin functional limits Motor Speech Errors: Not applicable Interfering Components: Inadequate dentition Effective Techniques: Over-articulate   GO            Mikea Quadros B. Rutherford Nail, M.S., CCC-SLP Speech-Language Pathologist          Lynx Goodrich 10/11/2016, 3:10 PM

## 2016-10-11 NOTE — Progress Notes (Signed)
Inpatient Diabetes Program Recommendations  AACE/ADA: New Consensus Statement on Inpatient Glycemic Control (2015)  Target Ranges:  Prepandial:   less than 140 mg/dL      Peak postprandial:   less than 180 mg/dL (1-2 hours)      Critically ill patients:  140 - 180 mg/dL   Lab Results  Component Value Date   GLUCAP 211 (H) 10/11/2016   HGBA1C 9.6 (H) 10/10/2016   Results for BETTEJANE, SAHLI (MRN [id number]) as of 10/11/2016 13:28  Ref. Range 10/10/2016 11:28 10/10/2016 16:31 10/10/2016 20:59 10/11/2016 06:28 10/11/2016 11:46  Glucose-Capillary Latest Ref Range: 65 - 99 mg/dL 212 (H) 286 (H) 317 (H) 209 (H) 211 (H)   Review of Glycemic Control  Review of Glycemic Control  Diabetes history: DM2, Obesity Outpatient Diabetes medications: U-500 60 units with breakfast, 50 units with lunch and 50 units with dinner or in evening (per patient and son at bedside this dosing clarified) Current orders for Inpatient glycemic control: Novolog 4 units tid meal coverage+ Novolog correction 0-20 units tid+ 0-5 units hs, Levemir 30 units daily  Inpatient Diabetes Program Recommendations:    Please consider increasing meal coverage to Novolog 6 units TIDAC if patient eats > 50% of meal.    This RN spoke with patient and her son at bedside.  Patient states that about 1 week ago, Dr. Buddy Duty (Endocrinology) changed her to U500.  Since that time, patient and son state that patient has had 3 lows (CBG into 50's) where patient experienced shakiness, silliness, and sweating.  Patient states that she believes the reason her MD changed her to U500 is due to high CBG's which required large doses of insulin multiple times per day.  Patient states that she prefers the other insulin regimen (prior to U500) because of the low CBG's she experienced.    Upon discharge, patient currently has both the U500 and previous insulins (Levemir and Novolog 70/30 mix) available.  Thank you,  Windy Carina, RN, MSN Diabetes  Coordinator Inpatient Diabetes Program [phone] (Team Pager)

## 2016-10-11 NOTE — Progress Notes (Signed)
Pt being discharged from hospital per orders from MD. Pt and son educated on discharge instructions. Pt and son verbalized understanding of instructions. All questions and concerns were addressed. Pt's IV was removed prior to discharge. Pt exited hospital via wheelchair.

## 2016-10-11 NOTE — Progress Notes (Addendum)
Late entry for missed G-codes for OT evaluation 10/29/2016.    29-Oct-2016 1000  OT G-codes **NOT FOR INPATIENT CLASS**  Functional Assessment Tool Used clinical judgement  Functional Limitation Self care  Self Care Current Status [id number]) CI  Self Care Goal Status [phone]) [place], OTR/L [phone]

## 2016-10-11 NOTE — Discharge Summary (Addendum)
Physician Discharge Summary  Melanie Mcdonald MRN: [id number] DOB/AGE: Oct 02, 1933 83 y.o.  PCP: Alysia Penna, MD   Admit date: 10/09/2016 Discharge date: 10/11/2016  Discharge Diagnoses:    Active Problems:   Essential hypertension   Peripheral vascular disease (HCC)   TIA (transient ischemic attack)   Gait instability   Uncontrolled type 2 diabetes mellitus with hyperglycemia, with long-term current use of insulin ([facility])   Dysarthria    Follow-up recommendations Follow-up with PCP in 3-5 days , including all  additional recommended appointments as below Follow-up CBC, CMP in 3-5 days Patient is being discharged home with home health     Current Discharge Medication List    START taking these medications   Details  aspirin EC 81 MG EC tablet Take 1 tablet (81 mg total) by mouth daily. Qty: 30 tablet, Refills: 1    senna-docusate (SENOKOT-S) 8.6-50 MG tablet Take 1 tablet by mouth at bedtime as needed for mild constipation. Qty: 30 tablet, Refills: 1      CONTINUE these medications which have CHANGED   Details  Insulin Detemir (LEVEMIR FLEXPEN) 100 UNIT/ML Pen Inject 30 Units into the skin daily at 10 pm. Inject 50 units in the AM and 100 units at bedtime, diagnosis code is E 11.9 Qty: 135 mL, Refills: 1    LORazepam (ATIVAN) 1 MG tablet Take 1 tablet (1 mg total) by mouth 2 (two) times daily. Qty: 10 tablet, Refills: 2    pramipexole (MIRAPEX) 1 MG tablet TAKE '5mg'$ =5 TABLETS BY MOUTH daily at bedtime Qty: 540 tablet, Refills: 1      CONTINUE these medications which have NOT CHANGED   Details  albuterol (PROVENTIL HFA;VENTOLIN HFA) 108 (90 BASE) MCG/ACT inhaler Inhale 2 puffs into the lungs every 4 (four) hours as needed for wheezing or shortness of breath. Qty: 1 Inhaler, Refills: 3   Associated Diagnoses: SOB (shortness of breath)    buPROPion (WELLBUTRIN XL) 150 MG 24 hr tablet Take 1 tablet (150 mg total) by mouth daily. Qty: 90 tablet, Refills: 3     cyclobenzaprine (FLEXERIL) 10 MG tablet Take 1 tablet (10 mg total) by mouth at bedtime. Qty: 30 tablet, Refills: 5   Associated Diagnoses: Cervicogenic headache    ENSURE (ENSURE) Take 237 mLs by mouth 2 (two) times daily as needed (poor appetite). Reported on 02/08/2016    furosemide (LASIX) 40 MG tablet Take 1 tablet (40 mg total) by mouth 2 (two) times daily. TAKE 1 IN THE MORNING AND ONE IN THE EVENING Qty: 60 tablet, Refills: 11   Associated Diagnoses: Sleep apnea; Essential hypertension; Acute on chronic diastolic congestive heart failure (HCC)    gabapentin (NEURONTIN) 300 MG capsule Take 1 capsule (300 mg total) by mouth 3 (three) times daily. Qty: 90 capsule, Refills: 5    glucose blood (ACCU-CHEK AVIVA) test strip Test once per day and diagnosis code is E 11.9 Qty: 100 each, Refills: 1    Insulin Pen Needle (NOVOFINE) 30G X 8 MM MISC USE TO CHECK BLOOD SUGAR TWICE A DAY Qty: 180 each, Refills: 3    insulin regular human CONCENTRATED (HUMULIN R U-500 KWIKPEN) 500 UNIT/ML kwikpen Inject 50-60 Units into the skin 3 (three) times daily with meals. Take 60units at breakfast 50 at lunch and 50 at supper    ketoconazole (NIZORAL) 2 % cream Apply 1 application topically 2 (two) times daily as needed for irritation (between bikini lines).    nitroGLYCERIN (NITROSTAT) 0.4 MG SL tablet Place 1 tablet (0.4  mg total) under the tongue every 5 (five) minutes as needed for chest pain. Qty: 50 tablet, Refills: 5    ondansetron (ZOFRAN) 8 MG tablet Take 1 tablet (8 mg total) by mouth every 8 (eight) hours as needed for nausea or vomiting. Qty: 60 tablet, Refills: 5    oxyCODONE-acetaminophen (PERCOCET) 10-325 MG tablet Take 1 tablet by mouth every 6 (six) hours as needed for pain. Qty: 120 tablet, Refills: 0    scopolamine (TRANSDERM-SCOP, 1.5 MG,) 1 MG/3DAYS Place 1 patch (1.5 mg total) onto the skin every 3 (three) days. Qty: 10 patch, Refills: 11    zolpidem (AMBIEN) 10 MG tablet TAKE  1 TABLET BY MOUTH AT BEDTIME Qty: 30 tablet, Refills: 5    Fluticasone-Salmeterol (ADVAIR DISKUS) 250-50 MCG/DOSE AEPB Inhale 1 puff into the lungs 2 (two) times daily. Qty: 60 each, Refills: 6   Associated Diagnoses: SOB (shortness of breath)    indomethacin (INDOCIN) 50 MG capsule Take 1 capsule (50 mg total) by mouth 3 (three) times daily as needed for moderate pain. Qty: 270 capsule, Refills: 3    losartan (COZAAR) 50 MG tablet Take 1 tablet (50 mg total) by mouth daily. Qty: 90 tablet, Refills: 3      STOP taking these medications     dextroamphetamine (DEXEDRINE SPANSULE) 10 MG 24 hr capsule      fluticasone (FLONASE) 50 MCG/ACT nasal spray      meclizine (ANTIVERT) 25 MG tablet      HYDROcodone-homatropine (HYDROMET) 5-1.5 MG/5ML syrup      insulin aspart protamine - aspart (NOVOLOG 70/30 MIX) (70-30) 100 UNIT/ML FlexPen      metolazone (ZAROXOLYN) 2.5 MG tablet      phenazopyridine (PYRIDIUM) 200 MG tablet          Discharge Condition: Stable    Discharge Instructions Get Medicines reviewed and adjusted: Please take all your medications with you for your next visit with your Primary MD  Please request your Primary MD to go over all hospital tests and procedure/radiological results at the follow up, please ask your Primary MD to get all Hospital records sent to his/her office.  If you experience worsening of your admission symptoms, develop shortness of breath, life threatening emergency, suicidal or homicidal thoughts you must seek medical attention immediately by calling 911 or calling your MD immediately if symptoms less severe.  You must read complete instructions/literature along with all the possible adverse reactions/side effects for all the Medicines you take and that have been prescribed to you. Take any new Medicines after you have completely understood and accpet all the possible adverse reactions/side effects.   Do not drive when taking Pain  medications.   Do not take more than prescribed Pain, Sleep and Anxiety Medications  Special Instructions: If you have smoked or chewed Tobacco in the last 2 yrs please stop smoking, stop any regular Alcohol and or any Recreational drug use.  Wear Seat belts while driving.  Please note  You were cared for by a hospitalist during your hospital stay. Once you are discharged, your primary care physician will handle any further medical issues. Please note that NO REFILLS for any discharge medications will be authorized once you are discharged, as it is imperative that you return to your primary care physician (or establish a relationship with a primary care physician if you do not have one) for your aftercare needs so that they can reassess your need for medications and monitor your lab values.  Allergies  Allergen Reactions  . Aspirin Other (See Comments)    Abdominal pain  . Ciprofloxacin Nausea And Vomiting    Made pt very sick on stomach  . Clarithromycin     Made stomach burn  . Doxycycline Itching  . Erythromycin Nausea And Vomiting  . Lisinopril Cough  . Lyrica [Pregabalin] Other (See Comments)    Suicidal   . Macrobid [Nitrofurantoin Monohyd Macro] Nausea And Vomiting  . Metolazone Other (See Comments)    Pt stated this made her BP drop  . Other     Pt cannot have any of the MYCIN's  . Penicillins Swelling    Has patient had a PCN reaction causing immediate rash, facial/tongue/throat swelling, SOB or lightheadedness with hypotension:  Has patient had a PCN reaction causing severe rash involving mucus membranes or skin necrosis:  Has patient had a PCN reaction that required hospitalization  Has patient had a PCN reaction occurring within the last 10 years:  If all of the above answers are "NO", then may proceed with Cephalosporin use. Childhood allergy  . Sulfonamide Derivatives Nausea And Vomiting      Disposition: 01-Home or Self Care   Consults:   Neurology    Significant Diagnostic Studies:  Dg Chest 2 View  Result Date: 10/09/2016 CLINICAL DATA:  TIA with a aphasia. EXAM: CHEST  2 VIEW COMPARISON:  11/10/2014 FINDINGS: The heart size and mediastinal contours are within normal limits. There is no evidence of pulmonary edema, consolidation, pneumothorax, nodule or pleural fluid. The thoracic spine shows stable degenerative disc disease. IMPRESSION: No active cardiopulmonary disease. Electronically Signed   By: Aletta Edouard M.D.   On: 10/09/2016 11:19   Ct Head Wo Contrast  Result Date: 10/09/2016 CLINICAL DATA:  Aphasia 2 days ago, generalized lower extremity weakness and left wrist pain, neck pain. EXAM: CT HEAD WITHOUT CONTRAST TECHNIQUE: Contiguous axial images were obtained from the base of the skull through the vertex without intravenous contrast. COMPARISON:  08/25/2015. FINDINGS: Brain: No evidence of an acute infarct, acute hemorrhage, mass lesion, mass effect or hydrocephalus. Atrophy. Mild periventricular low attenuation. Vascular: No hyperdense vessel or unexpected calcification. Skull: Normal. Negative for fracture or focal lesion. Sinuses/Orbits: No acute finding. Other: None. IMPRESSION: 1. No acute intracranial abnormality. 2. Atrophy and chronic microvascular white matter ischemic changes. Electronically Signed   By: Lorin Picket M.D.   On: 10/09/2016 11:23   Mr Brain Wo Contrast  Result Date: 10/10/2016 CLINICAL DATA:  Dysarthria EXAM: MRI HEAD WITHOUT CONTRAST MRA HEAD WITHOUT CONTRAST TECHNIQUE: Multiplanar, multiecho pulse sequences of the brain and surrounding structures were obtained without intravenous contrast. Angiographic images of the head were obtained using MRA technique without contrast. COMPARISON:  None. FINDINGS: MRI HEAD FINDINGS Brain: No focal diffusion restriction to indicate acute infarct. No intraparenchymal hemorrhage. Small remote infarct within the right frontal white matter. Mild periventricular  hyperintense T2 weighted signal. No mass lesion or midline shift. No hydrocephalus or extra-axial fluid collection. The midline structures are normal. No age advanced or lobar predominant atrophy. Vascular: Major intracranial arterial and venous sinus flow voids are preserved. No evidence of chronic microhemorrhage or amyloid angiopathy. Skull and upper cervical spine: The visualized skull base, calvarium, upper cervical spine and extracranial soft tissues are normal. Sinuses/Orbits: No fluid levels or advanced mucosal thickening. No mastoid effusion. Normal orbits. MRA HEAD FINDINGS Intracranial internal carotid arteries: Normal. Anterior cerebral arteries: Normal. Middle cerebral arteries: Normal. Posterior communicating arteries: Present bilaterally. Posterior cerebral arteries: Normal. Basilar artery:  Normal. Vertebral arteries: Codominant. Normal. Superior cerebellar arteries: Normal. Anterior inferior cerebellar arteries: Normal on the right. Not clearly visualized on the left. Posterior inferior cerebellar arteries: Normal. IMPRESSION: 1. No acute intracranial abnormality. 2. Old, small right frontal infarct and findings of mild chronic microvascular ischemia. 3. No intracranial arterial occlusion, aneurysm or high-grade stenosis. Electronically Signed   By: Deatra Robinson M.D.   On: 10/10/2016 03:14   Dg Shoulder Left  Result Date: 10/09/2016 CLINICAL DATA:  Anterior left shoulder pain radiating to the neck EXAM: LEFT SHOULDER - 2+ VIEW COMPARISON:  None. FINDINGS: Mild AC joint degenerative change. No acute fracture or dislocation is visualized. Left lung apex clear. Atherosclerotic calcifications of the aortic arch. IMPRESSION: No acute osseous abnormality Electronically Signed   By: Jasmine Pang M.D.   On: 10/09/2016 20:23   Mr Maxine Glenn Head/brain IS Cm  Result Date: 10/10/2016 CLINICAL DATA:  Dysarthria EXAM: MRI HEAD WITHOUT CONTRAST MRA HEAD WITHOUT CONTRAST TECHNIQUE: Multiplanar, multiecho pulse  sequences of the brain and surrounding structures were obtained without intravenous contrast. Angiographic images of the head were obtained using MRA technique without contrast. COMPARISON:  None. FINDINGS: MRI HEAD FINDINGS Brain: No focal diffusion restriction to indicate acute infarct. No intraparenchymal hemorrhage. Small remote infarct within the right frontal white matter. Mild periventricular hyperintense T2 weighted signal. No mass lesion or midline shift. No hydrocephalus or extra-axial fluid collection. The midline structures are normal. No age advanced or lobar predominant atrophy. Vascular: Major intracranial arterial and venous sinus flow voids are preserved. No evidence of chronic microhemorrhage or amyloid angiopathy. Skull and upper cervical spine: The visualized skull base, calvarium, upper cervical spine and extracranial soft tissues are normal. Sinuses/Orbits: No fluid levels or advanced mucosal thickening. No mastoid effusion. Normal orbits. MRA HEAD FINDINGS Intracranial internal carotid arteries: Normal. Anterior cerebral arteries: Normal. Middle cerebral arteries: Normal. Posterior communicating arteries: Present bilaterally. Posterior cerebral arteries: Normal. Basilar artery: Normal. Vertebral arteries: Codominant. Normal. Superior cerebellar arteries: Normal. Anterior inferior cerebellar arteries: Normal on the right. Not clearly visualized on the left. Posterior inferior cerebellar arteries: Normal. IMPRESSION: 1. No acute intracranial abnormality. 2. Old, small right frontal infarct and findings of mild chronic microvascular ischemia. 3. No intracranial arterial occlusion, aneurysm or high-grade stenosis. Electronically Signed   By: Deatra Robinson M.D.   On: 10/10/2016 03:14   2-D echo    Filed Weights   10/09/16 1038 10/09/16 1924  Weight: 108.9 kg (240 lb) 107.6 kg (237 lb 3.4 oz)     Microbiology: No results found for this or any previous visit (from the past 240 hour(s)).      Blood Culture    Component Value Date/Time   SDES URINE, CLEAN CATCH 09/09/2012 2044   SPECREQUEST NONE 09/09/2012 2044   CULT NO GROWTH 09/09/2012 2044   REPTSTATUS 09/11/2012 FINAL 09/09/2012 2044      Labs: Results for orders placed or performed during the hospital encounter of 10/09/16 (from the past 48 hour(s))  Protime-INR     Status: None   Collection Time: 10/09/16 10:41 AM  Result Value Ref Range   Prothrombin Time 13.7 11.4 - 15.2 seconds   INR 1.05   APTT     Status: None   Collection Time: 10/09/16 10:41 AM  Result Value Ref Range   aPTT 26 24 - 36 seconds  CBC     Status: None   Collection Time: 10/09/16 10:41 AM  Result Value Ref Range   WBC 10.3 4.0 - 10.5 K/uL  RBC 4.22 3.87 - 5.11 MIL/uL   Hemoglobin 12.3 12.0 - 15.0 g/dL   HCT 37.4 36.0 - 46.0 %   MCV 88.6 78.0 - 100.0 fL   MCH 29.1 26.0 - 34.0 pg   MCHC 32.9 30.0 - 36.0 g/dL   RDW 14.0 11.5 - 15.5 %   Platelets 214 150 - 400 K/uL  Differential     Status: None   Collection Time: 10/09/16 10:41 AM  Result Value Ref Range   Neutrophils Relative % 62 %   Neutro Abs 6.3 1.7 - 7.7 K/uL   Lymphocytes Relative 30 %   Lymphs Abs 3.1 0.7 - 4.0 K/uL   Monocytes Relative 6 %   Monocytes Absolute 0.6 0.1 - 1.0 K/uL   Eosinophils Relative 2 %   Eosinophils Absolute 0.3 0.0 - 0.7 K/uL   Basophils Relative 0 %   Basophils Absolute 0.0 0.0 - 0.1 K/uL  Comprehensive metabolic panel     Status: Abnormal   Collection Time: 10/09/16 10:41 AM  Result Value Ref Range   Sodium 137 135 - 145 mmol/L   Potassium 4.3 3.5 - 5.1 mmol/L   Chloride 102 101 - 111 mmol/L   CO2 28 22 - 32 mmol/L   Glucose, Bld 268 (H) 65 - 99 mg/dL   BUN 26 (H) 6 - 20 mg/dL   Creatinine, Ser 0.89 0.44 - 1.00 mg/dL   Calcium 9.0 8.9 - 10.3 mg/dL   Total Protein 7.3 6.5 - 8.1 g/dL   Albumin 3.5 3.5 - 5.0 g/dL   AST 19 15 - 41 U/L   ALT 18 14 - 54 U/L   Alkaline Phosphatase 88 38 - 126 U/L   Total Bilirubin 0.7 0.3 - 1.2 mg/dL    GFR calc non Af Amer 59 (L) >60 mL/min   GFR calc Af Amer >60 >60 mL/min    Comment: (NOTE) The eGFR has been calculated using the CKD EPI equation. This calculation has not been validated in all clinical situations. eGFR's persistently <60 mL/min signify possible Chronic Kidney Disease.    Anion gap 7 5 - 15  I-stat troponin, ED     Status: None   Collection Time: 10/09/16 10:50 AM  Result Value Ref Range   Troponin i, poc 0.00 0.00 - 0.08 ng/mL   Comment 3            Comment: Due to the release kinetics of cTnI, a negative result within the first hours of the onset of symptoms does not rule out myocardial infarction with certainty. If myocardial infarction is still suspected, repeat the test at appropriate intervals.   CBG monitoring, ED     Status: Abnormal   Collection Time: 10/09/16 10:50 AM  Result Value Ref Range   Glucose-Capillary 247 (H) 65 - 99 mg/dL  Glucose, capillary     Status: Abnormal   Collection Time: 10/09/16  9:24 PM  Result Value Ref Range   Glucose-Capillary 118 (H) 65 - 99 mg/dL   Comment 1 Notify RN    Comment 2 Document in Chart   Hemoglobin A1c     Status: Abnormal   Collection Time: 10/10/16  6:02 AM  Result Value Ref Range   Hgb A1c MFr Bld 9.6 (H) 4.8 - 5.6 %    Comment: (NOTE)         Pre-diabetes: 5.7 - 6.4         Diabetes: >6.4         Glycemic control for  adults with diabetes: <7.0    Mean Plasma Glucose 229 mg/dL    Comment: (NOTE) Performed At: [facility] [place], [place] [id number] Lindon Romp MD [id number]   Lipid panel     Status: None   Collection Time: 10/10/16  6:02 AM  Result Value Ref Range   Cholesterol 125 0 - 200 mg/dL   Triglycerides 80 <150 mg/dL   HDL 48 >40 mg/dL   Total CHOL/HDL Ratio 2.6 RATIO   VLDL 16 0 - 40 mg/dL   LDL Cholesterol 61 0 - 99 mg/dL    Comment:        Total Cholesterol/HDL:CHD Risk Coronary Heart Disease Risk Table                     Men   Women  1/2  Average Risk   3.4   3.3  Average Risk       5.0   4.4  2 X Average Risk   9.6   7.1  3 X Average Risk  23.4   11.0        Use the calculated Patient Ratio above and the CHD Risk Table to determine the patient's CHD Risk.        ATP III CLASSIFICATION (LDL):  <100     mg/dL   Optimal  100-129  mg/dL   Near or Above                    Optimal  130-159  mg/dL   Borderline  160-189  mg/dL   High  >190     mg/dL   Very High   Glucose, capillary     Status: Abnormal   Collection Time: 10/10/16  6:13 AM  Result Value Ref Range   Glucose-Capillary 204 (H) 65 - 99 mg/dL   Comment 1 Notify RN    Comment 2 Document in Chart   Glucose, capillary     Status: Abnormal   Collection Time: 10/10/16 11:28 AM  Result Value Ref Range   Glucose-Capillary 212 (H) 65 - 99 mg/dL   Comment 1 Notify RN   Sedimentation rate     Status: Abnormal   Collection Time: 10/10/16  1:19 PM  Result Value Ref Range   Sed Rate 30 (H) 0 - 22 mm/hr  CK     Status: None   Collection Time: 10/10/16  1:19 PM  Result Value Ref Range   Total CK 67 38 - 234 U/L  Glucose, capillary     Status: Abnormal   Collection Time: 10/10/16  4:31 PM  Result Value Ref Range   Glucose-Capillary 286 (H) 65 - 99 mg/dL  Glucose, capillary     Status: Abnormal   Collection Time: 10/10/16  8:59 PM  Result Value Ref Range   Glucose-Capillary 317 (H) 65 - 99 mg/dL  Glucose, capillary     Status: Abnormal   Collection Time: 10/11/16  6:28 AM  Result Value Ref Range   Glucose-Capillary 209 (H) 65 - 99 mg/dL   Comment 1 Notify RN    Comment 2 Document in Chart      Lipid Panel     Component Value Date/Time   CHOL 125 10/10/2016 0602   CHOL 160 12/21/2014 1153   TRIG 80 10/10/2016 0602   TRIG 62 12/21/2014 1153   HDL 48 10/10/2016 0602   HDL 74 12/21/2014 1153   CHOLHDL 2.6 10/10/2016 0602   VLDL 16  10/10/2016 0602   LDLCALC 61 10/10/2016 0602   LDLCALC 74 12/21/2014 1153     Lab Results  Component Value Date    HGBA1C 9.6 (H) 10/10/2016   HGBA1C 8.9 06/07/2016   HGBA1C 8.4 (H) 05/10/2015      HPI   Melanie Dvorsky Wilesis an 83 y.o.femalewho presented [facility] ED with c/c of dysarthria, beginning 2 days previously. Her son stated that at about 68 AM on 1/6 (LKW) he noticed dysarthric speech while speaking with her on the telephone. He asked a friend to check on her and the friend also noted dysarthria, calling EMS. The patient refused to go to the ED at that point and over the next 24 hours her dysarthria improved slightly. On 1/8 she finally decided to be seen in the ED, as her PCP had no appointments available. In the ED she stated that her speech was slow. At that time she denied vision loss, headache, sensory changes or focal weakness. At time of Neurology evaluation she is still negative for the above, except for 2/10 frontal headache and "blurred vision" that is chronic. She denies CP, abdominal pain or limb pain except for left shoulder pain that is chronic. She endorses SOB with exertion.   Her PMHx includes HTN, DM, HLD,GERD andCOPD.  CT of the brain wasnegative. EKG showed sinus rhythm with nonspecific ST changes.  Patient was not administered IV t-PA. She was admitted for further evaluation and treatment.   HOSPITAL COURSE: *   Dysarthria/TIA vs seizure   Ruled out for acute stroke -Neurology consulted-  -PT/OT evaluation-Home health OT;Supervision/Assistance - 24 hour -Speech therapy eval-regular diet -CT brain--neg -MRI brain--no acute CVA  -Carotid Duplex--No evidence a significant stenosis  -Echo-- No cardiac source of emboli was indentified. -LDL 59 -HbA1C-9.6 -Antiplatelet--ASA 81 mg daily as recommended by neurology Home health upon discharge     Hypertension Encourage compliance with all home medications. Agree with not taking Zaroxolyn Needs to follow-up with PCP for compliance   Diabetes mellitus type 2 -Hemoglobin A1c 9.6 Patient will be discharged home on  u-500 and levemir   Lower extremity edema -Venous duplex negative    Gait instability -Patient had a mechanical fall on 10/07/2011 -PT evaluation -Minimize hypnotic medications -X-ray left shoulder No acute osseous abnormality, pain started after her fall in nov,2017, have ordered MRI left shoulder, this needs to be done as outpatient as insurance won't pay for this examination during this admission     Discharge Exam:   Blood pressure (!) 160/71, pulse 77, temperature 98 F (36.7 C), temperature source Oral, resp. rate 20, height $RemoveBe'5\' 7"'[id number]$  (1.702 m), weight 107.6 kg (237 lb 3.4 oz), SpO2 99 %. General exam: Appears calm and comfortable  Respiratory system: Clear to auscultation. Respiratory effort normal. Cardiovascular system: S1 & S2 heard, RRR. No JVD, murmurs, rubs, gallops or clicks. No pedal edema. Gastrointestinal system: Abdomen is nondistended, soft and nontender. No organomegaly or masses felt. Normal bowel sounds heard. Central nervous system: Alert and oriented. No focal neurological deficits. Extremities: Left shoulder mobility limited by pain, unable to abduct or adduct her left arm Skin: No rashes, lesions or ulcers Psychiatry: Judgement and insight appear normal. Mood & affect appropriate.      Follow-up Information    Alysia Penna, MD. Call.   Specialty:  Family Medicine Why:  Hospital follow-up Contact information: [place] [address] [phone]           Signed: Reyne Dumas 10/11/2016, 8:55 AM  Time spent >45 mins   

## 2016-10-11 NOTE — Progress Notes (Signed)
  Echocardiogram 2D Echocardiogram with [facility] has been performed.  Melanie Mcdonald 10/11/2016, 1:01 PM

## 2016-10-11 NOTE — Care Management Note (Signed)
Case Management Note  Patient Details  Name: Melanie Mcdonald MRN: [id number] Date of Birth: Dec 07, 1932  Subjective/Objective:                    Action/Plan: Pt discharging home with [facility] that was arranged yesterday. MD added RN today which was called to [facility] with Arville Go. Son providing transportation.  Expected Discharge Date:  10/13/16               Expected Discharge Plan:  [facility]  In-House Referral:     Discharge planning Services  CM Consult  Post Acute Care Choice:  Home Health Choice offered to:  Patient, Adult Children  DME Arranged:    DME Agency:     HH Arranged:  PT, OT, Nurse's Aide [place] Agency:  [facility] (now [facility])  Status of Service:  Completed, signed off  If discussed at [facility] of Stay Meetings, dates discussed:    Additional Comments:  Pollie Friar, RN 10/11/2016, 6:41 PM

## 2016-10-13 ENCOUNTER — Telehealth: Payer: Self-pay

## 2016-10-13 NOTE — Telephone Encounter (Signed)
LMTCB

## 2016-10-16 ENCOUNTER — Telehealth: Payer: Self-pay | Admitting: Family Medicine

## 2016-10-16 NOTE — Telephone Encounter (Signed)
FYI Patient will have PT evaluation today.

## 2016-10-17 ENCOUNTER — Telehealth: Payer: Self-pay | Admitting: Family Medicine

## 2016-10-17 NOTE — Telephone Encounter (Signed)
° ° °  Clair Gulling with [facility] call to ask for verbal orders to continue OT      Phone number  [phone]

## 2016-10-17 NOTE — Telephone Encounter (Signed)
Spoke with Dr Buddy Duty. He is very upset that pt was sent home from hospital with multiple insulins and confusing directions. He advised pt continue Humulin U-500 taking 60u at breakfast, 50u at lunch and 50u in evening. He also advised that pt may add in Levemir 10u daily. He would like to see pt ASAP in his office to check blood sugar and correct insulin dosing. He asked me to reach out to Dr Allyson Sabal to advise that medications were adjusted and sent to pharmacy with wrong dosing directions and that this could have been potentially dangerous for the pt. Message sent to Dr Allyson Sabal to advise.   Spoke with pt and advised as to medication instructions. Also scheduled appt with Dr Buddy Duty for 10/18/16 @ 2pm. Also advised pt that if blood sugar is very high or very low to contact either our office or Dr Buddy Duty as someone is always on call. Pt aware and voiced understanding. Nothing further needed at this time.    Dr. Allyson Sabal - FYI. Thanks!

## 2016-10-17 NOTE — Telephone Encounter (Signed)
Per Dr. Sarajane Jews okay and I did speak with Clair Gulling and gave below verbal orders.

## 2016-10-17 NOTE — Telephone Encounter (Signed)
Spoke with pt and she states that she still feels very weak. She is due to have PT come for evaluation today. Advised her to make sure and get up and around a couple of times a day so that she is not laying in bed all day. Try walking from one end of house to the other, making sure she is using cane or walker. She did have some questions about her medications. Reviewed them with her, and also will reach out to Dr Buddy Duty for insulin management.   Appt made with Dr Sarajane Jews 10/23/16. Pt aware.    Transition Care Management Follow-up Telephone Call  How have you been since you were released from the hospital? weak   Do you understand why you were in the hospital? yes   Do you understand the discharge instrcutions? yes  Items Reviewed:  Medications reviewed: yes  Allergies reviewed: yes  Dietary changes reviewed: yes  Referrals reviewed: yes   Functional Questionnaire:   Activities of Daily Living (ADLs):   She states they are independent in the following: all but ambulation  States they require assistance with the following: ambulation   Any transportation issues/concerns?: no   Any patient concerns? no   Confirmed importance and date/time of follow-up visits scheduled: yes   Confirmed with patient if condition begins to worsen call PCP or go to the ER.  Patient was given the Call-a-Nurse line [phone]: yes

## 2016-10-17 NOTE — Telephone Encounter (Signed)
D/C 10/11/16 To: home

## 2016-10-23 ENCOUNTER — Ambulatory Visit (INDEPENDENT_AMBULATORY_CARE_PROVIDER_SITE_OTHER): Payer: Medicare Other | Admitting: Family Medicine

## 2016-10-23 ENCOUNTER — Encounter: Payer: Self-pay | Admitting: Family Medicine

## 2016-10-23 VITALS — BP 135/88 | HR 88 | Temp 98.0°F

## 2016-10-23 DIAGNOSIS — Z794 Long term (current) use of insulin: Secondary | ICD-10-CM

## 2016-10-23 DIAGNOSIS — G458 Other transient cerebral ischemic attacks and related syndromes: Secondary | ICD-10-CM

## 2016-10-23 DIAGNOSIS — E1165 Type 2 diabetes mellitus with hyperglycemia: Secondary | ICD-10-CM

## 2016-10-23 DIAGNOSIS — R2681 Unsteadiness on feet: Secondary | ICD-10-CM

## 2016-10-23 DIAGNOSIS — I10 Essential (primary) hypertension: Secondary | ICD-10-CM

## 2016-10-23 DIAGNOSIS — R531 Weakness: Secondary | ICD-10-CM

## 2016-10-23 DIAGNOSIS — R42 Dizziness and giddiness: Secondary | ICD-10-CM | POA: Diagnosis not present

## 2016-10-23 MED ORDER — INSULIN REGULAR HUMAN (CONC) 500 UNIT/ML ~~LOC~~ SOPN: PEN_INJECTOR | SUBCUTANEOUS | 12 refills | Status: DC

## 2016-10-23 MED ORDER — INSULIN DETEMIR 100 UNIT/ML FLEXPEN: PEN_INJECTOR | SUBCUTANEOUS | 1 refills | Status: DC

## 2016-10-23 NOTE — Progress Notes (Signed)
   Subjective:    Patient ID: Melanie Mcdonald, female    DOB: 1932/10/10, 83 y.o.   MRN: [id number]  HPI Here for a transitional care follow up of a hospital stay from 10-09-16 to 10-11-16 for 2 days of dysarthria. No other neurologic deficits were seen on exam except for her chronic gait instability. An MRI of the brain found an old small infarct in the right frontal area but no acute lesions were seen. She was diagnosed with TIAs. She was sent home on aspirin 81 mg daily. Her BP was stable. Her A1c was 9.6 so her insulin regimen was adjusted. She is taking Humulin R at 60 units with breakfast, 50 with lunch, and 50 with supper. There is some confusion about the Levamir dose however. She had been taking 30 units at bedtime prior to this hospitalization. Her BP has been stable. Her speech has returned to normal but her baseline gait instability and dizziness remain unchanged.    Review of Systems  Constitutional: Positive for fatigue.  Respiratory: Negative.   Cardiovascular: Negative.   Gastrointestinal: Negative.   Neurological: Positive for dizziness, weakness and light-headedness. Negative for tremors, seizures, syncope, facial asymmetry, speech difficulty, numbness and headaches.       Objective:   Physical Exam  Constitutional: She is oriented to person, place, and time.  Alert, appears weak but at her baseline. Using a rolling walker  Cardiovascular: Normal rate, regular rhythm, normal heart sounds and intact distal pulses.   Pulmonary/Chest: Effort normal and breath sounds normal.  Neurological: She is alert and oriented to person, place, and time. No cranial nerve deficit.  Her speech is normal           Assessment & Plan:  She is recovering from a recent TIA with no acute strokes seen on the MRI. No stenotic vessels seen on MRA. Her dysarthria has resolved. Her HTN is stable. Her diabetes was poorly controlled and I advised her to follow the new Humulin R regimen above. However  instead of dosing the Levamir multiple times during the day, we will adjust this to take 50 units once a day at bedtime. She is scheduled to see Dr. Buddy Duty for this next Monday.  Alysia Penna, MD

## 2016-10-23 NOTE — Progress Notes (Signed)
Pre visit review using our clinic review tool, if applicable. No additional management support is needed unless otherwise documented below in the visit note. Pt unable to weigh 

## 2016-11-02 ENCOUNTER — Telehealth: Payer: Self-pay

## 2016-11-02 NOTE — Telephone Encounter (Signed)
Received PA request from insurance company for Zolpidem. PA submitted & is pending. Key: [id number]

## 2016-11-03 NOTE — Telephone Encounter (Signed)
Received request for more info from insurance, form faxed back to pharmacy.

## 2016-11-06 NOTE — Telephone Encounter (Signed)
PA approved, form faxed back to pharmacy. 

## 2016-11-07 ENCOUNTER — Telehealth: Payer: Self-pay | Admitting: Family Medicine

## 2016-11-07 NOTE — Telephone Encounter (Signed)
Mark with [facility] states pt's bp is 182/85.  He has checked several times while he has been there and it remains high. Melanie Mcdonald reports pt has stopped taking her bp med due to side effects. Last week before she stopped her bp med it was around 130/70.  Melanie Mcdonald still with pt and he wanted to know if pt should go to the ed. Dr Sarajane Jews has gone for the day. Transferred mark to triage.

## 2016-11-08 NOTE — Telephone Encounter (Signed)
I spoke with pt, went over below information, also updated dosage change in chart.

## 2016-11-08 NOTE — Telephone Encounter (Signed)
Can we get some more information about why she stopped taking the BP med?

## 2016-11-08 NOTE — Telephone Encounter (Signed)
° ° ° °  Pt nurse Manuela Schwartz with [facility] call to say the reason pt is not losartan (COZAAR) 50 MG tablet because she said it gives her diarrhea

## 2016-11-08 NOTE — Telephone Encounter (Signed)
I spoke with pt and she last took Losartan 50 mg on 10/23/2016, has not taken since. Rechecked pressure today and it was 143/70. Please advise?

## 2016-11-08 NOTE — Telephone Encounter (Signed)
Tell her to take 1/2 tablet of Losartan (25 mg) every day

## 2016-11-20 ENCOUNTER — Ambulatory Visit: Payer: Medicare Other | Admitting: Family Medicine

## 2016-11-20 ENCOUNTER — Encounter: Payer: Self-pay | Admitting: Family Medicine

## 2016-11-20 ENCOUNTER — Ambulatory Visit (INDEPENDENT_AMBULATORY_CARE_PROVIDER_SITE_OTHER): Payer: Medicare Other | Admitting: Family Medicine

## 2016-11-20 VITALS — BP 150/78 | Temp 98.0°F

## 2016-11-20 DIAGNOSIS — L03811 Cellulitis of head [any part, except face]: Secondary | ICD-10-CM | POA: Diagnosis not present

## 2016-11-20 MED ORDER — OXYCODONE-ACETAMINOPHEN 10-325 MG PO TABS: 1.0000 | ORAL_TABLET | Freq: Four times a day (QID) | ORAL | 0 refills | Status: DC | PRN

## 2016-11-20 NOTE — Progress Notes (Signed)
Pre visit review using our clinic review tool, if applicable. No additional management support is needed unless otherwise documented below in the visit note. Pt unable to weigh 

## 2016-11-20 NOTE — Progress Notes (Signed)
   Subjective:    Patient ID: Melanie Mcdonald, female    DOB: 09-09-1933, 83 y.o.   MRN: [id number]  HPI Here to follow up on a rash on the left side of the head. This started one week ago as a few tender pimples above the left ear. Over several days the rash spread and she developed several swollen nodes on the left neck. No fever. She saw urgent care and was given Keflex. Now she is improving, the rash is drying up, and the nodes are getting smaller.    Review of Systems  Constitutional: Negative.   HENT: Negative.   Eyes: Negative.   Respiratory: Negative.   Skin: Positive for rash.  Hematological: Positive for adenopathy.       Objective:   Physical Exam  Constitutional: She appears well-developed and well-nourished.  HENT:  Right Ear: External ear normal.  Left Ear: External ear normal.  Nose: Nose normal.  Mouth/Throat: Oropharynx is clear and moist.  There is a small area of pink scabbing above the left ear. There are several tiny tender nodes in the left neck and behind the left ear  Eyes: Conjunctivae are normal.  Neck: No thyromegaly present.  Pulmonary/Chest: Effort normal and breath sounds normal.          Assessment & Plan:  Cellulitis with regional lymphadenopathy. This is responding well so she will finish out the full 14 day course of Keflex. Recheck prn. Alysia Penna, MD

## 2016-12-05 ENCOUNTER — Encounter: Payer: Self-pay | Admitting: Family Medicine

## 2016-12-05 ENCOUNTER — Ambulatory Visit (INDEPENDENT_AMBULATORY_CARE_PROVIDER_SITE_OTHER): Payer: Medicare Other | Admitting: Family Medicine

## 2016-12-05 VITALS — BP 150/80 | Temp 97.6°F

## 2016-12-05 DIAGNOSIS — S80812A Abrasion, left lower leg, initial encounter: Secondary | ICD-10-CM | POA: Diagnosis not present

## 2016-12-05 DIAGNOSIS — G47419 Narcolepsy without cataplexy: Secondary | ICD-10-CM | POA: Diagnosis not present

## 2016-12-05 DIAGNOSIS — L03811 Cellulitis of head [any part, except face]: Secondary | ICD-10-CM | POA: Diagnosis not present

## 2016-12-05 MED ORDER — DEXTROAMPHETAMINE SULFATE ER 10 MG PO CP24: 20.0000 mg | ORAL_CAPSULE | Freq: Every day | ORAL | 0 refills | Status: DC

## 2016-12-05 MED ORDER — CEPHALEXIN 500 MG PO CAPS: 500.0000 mg | ORAL_CAPSULE | Freq: Four times a day (QID) | ORAL | 0 refills | Status: AC

## 2016-12-05 NOTE — Progress Notes (Signed)
   Subjective:    Patient ID: Melanie Mcdonald, female    DOB: 1933/08/05, 83 y.o.   MRN: [id number]  HPI Here for several issues. First she struck her left lower leg against a table at home last night and immediately began to ooze clear fluid from the wound. She has been soaking several bandages with this fluid since then. There is no pain. Second she thinks she needs another round of antibiotics for the scalp rash we recently treated. She took 10 days of Keflex and the rash improved. However she still has tender swollen nodes in the neck. Lastly she asks for a refill on her narcolepsy medication. She has not taken Dexadrine for about a year.    Review of Systems  Constitutional: Negative.   Respiratory: Negative.   Cardiovascular: Positive for leg swelling. Negative for chest pain and palpitations.  Skin: Positive for wound.  Neurological: Negative.   Hematological: Positive for adenopathy.       Objective:   Physical Exam  Constitutional: She is oriented to person, place, and time. She appears well-developed and well-nourished.  Neck: Neck supple. No thyromegaly present.  Some shotty tender posterior cervical nodes are present   Cardiovascular: Normal rate, regular rhythm, normal heart sounds and intact distal pulses.   Pulmonary/Chest: Effort normal and breath sounds normal.  Musculoskeletal:  The lower left shin has a superficial abrasion about 1 cm in diameter. Some serous fluid is oozing out of this. The area is not tender or warm or red.   Neurological: She is alert and oriented to person, place, and time.  Skin:  The scalp is clear           Assessment & Plan:  For the partially treated scalp cellulitis, she will take another 10 days of Keflex. The leg wound is dressed today with Neosporin and a pressure wrap of gauze. This does not look infected but the Keflex would cover this anyway. For the narcolepsy we refilled Dexedrine spansules to use daily.  Alysia Penna, MD

## 2016-12-05 NOTE — Patient Instructions (Signed)
WE NOW OFFER   [facility] FAST TRACK!!!  SAME DAY Appointments for ACUTE CARE  Such as: Sprains, Injuries, cuts, abrasions, rashes, muscle pain, joint pain, back pain Colds, flu, sore throats, headache, allergies, cough, fever  Ear pain, sinus and eye infections Abdominal pain, nausea, vomiting, diarrhea, upset stomach Animal/insect bites  3 Easy Ways to Schedule: Walk-In Scheduling Call in scheduling Mychart Sign-up: [email or web address]         

## 2016-12-05 NOTE — Progress Notes (Signed)
Pre visit review using our clinic review tool, if applicable. No additional management support is needed unless otherwise documented below in the visit note. Pt unable to stand and weigh.   

## 2016-12-18 ENCOUNTER — Telehealth: Payer: Self-pay | Admitting: Family Medicine

## 2016-12-18 NOTE — Telephone Encounter (Signed)
Done

## 2016-12-18 NOTE — Telephone Encounter (Signed)
° °  Amy with [facility] call to say   Skilled nursing 1 week 6 Home health aide 2 week 6  req evidence for CHF or any cardiac diag for there CT program   Both legs and feet swollen and is asking for umma boot or compression stocking looks like beginning stages of cellulitis.  Non compl with medication and has general malaise   [phone]

## 2016-12-18 NOTE — Telephone Encounter (Signed)
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

## 2016-12-18 NOTE — Telephone Encounter (Signed)
She had an ECHO on 0-60-04 showing diastolic heart failure. They may apply compression stockings as needed

## 2016-12-19 NOTE — Telephone Encounter (Signed)
Information was faxed to below number

## 2016-12-22 ENCOUNTER — Encounter: Payer: Self-pay | Admitting: Family Medicine

## 2016-12-22 ENCOUNTER — Ambulatory Visit (INDEPENDENT_AMBULATORY_CARE_PROVIDER_SITE_OTHER): Payer: Medicare Other | Admitting: Family Medicine

## 2016-12-22 VITALS — BP 148/82 | HR 90 | Temp 97.7°F | Wt 237.0 lb

## 2016-12-22 DIAGNOSIS — F339 Major depressive disorder, recurrent, unspecified: Secondary | ICD-10-CM | POA: Diagnosis not present

## 2016-12-22 DIAGNOSIS — B029 Zoster without complications: Secondary | ICD-10-CM

## 2016-12-22 DIAGNOSIS — M7989 Other specified soft tissue disorders: Secondary | ICD-10-CM

## 2016-12-22 MED ORDER — VALACYCLOVIR HCL 1 G PO TABS: 1000.0000 mg | ORAL_TABLET | Freq: Three times a day (TID) | ORAL | 0 refills | Status: DC

## 2016-12-22 MED ORDER — BUPROPION HCL ER (XL) 150 MG PO TB24: 150.0000 mg | ORAL_TABLET | Freq: Every day | ORAL | 3 refills | Status: DC

## 2016-12-22 NOTE — Patient Instructions (Signed)
WE NOW OFFER   [facility] FAST TRACK!!!  SAME DAY Appointments for ACUTE CARE  Such as: Sprains, Injuries, cuts, abrasions, rashes, muscle pain, joint pain, back pain Colds, flu, sore throats, headache, allergies, cough, fever  Ear pain, sinus and eye infections Abdominal pain, nausea, vomiting, diarrhea, upset stomach Animal/insect bites  3 Easy Ways to Schedule: Walk-In Scheduling Call in scheduling Mychart Sign-up: [email or web address]         

## 2016-12-22 NOTE — Progress Notes (Signed)
   Subjective:    Patient ID: Melanie Mcdonald, female    DOB: December 28, 1932, 83 y.o.   MRN: [id number]  HPI Here for 3 issues. First she has chronic lower leg and ankle swelling and she was recently evaluated for compression stockings. We sent in a prescription to [facility] for a pair of these, and someone actually went to her home a few weeks ago to take measurements. However she has heard nothing since then and asks where we are in the process. Second, she stopped taking Wellbutrin a few months ago and now her depression is acting up again. She feels sad, tearful, and lacks motivation to take care of herself. She has trouble sleeping and has a poor appetite. Third she began to have an itching and a burning pain under the skin of her right neck about one week ago. No visible rash. She is very frightened that this could be early shingles.   Review of Systems  Constitutional: Negative.   Respiratory: Negative.   Cardiovascular: Positive for leg swelling. Negative for chest pain and palpitations.  Skin: Negative for rash.  Neurological: Negative.   Psychiatric/Behavioral: Positive for decreased concentration, dysphoric mood and sleep disturbance. Negative for agitation, confusion, hallucinations, self-injury and suicidal ideas. The patient is nervous/anxious.        Objective:   Physical Exam  Constitutional: She appears well-developed and well-nourished.  Using her walker   Cardiovascular: Normal rate, regular rhythm, normal heart sounds and intact distal pulses.   Pulmonary/Chest: Effort normal and breath sounds normal.  Musculoskeletal:  2+ edema in both ankles and lower legs   Skin:  The skin of her right neck appears normal, no rash, no erythema or tenderness  Psychiatric: Her behavior is normal. Judgment and thought content normal.  Affect is depressed and tearful           Assessment & Plan:  For the ankle edema, we will contact [facility] and see where they are in the  process of getting compression stockings. For the depression we will get her back on Wellbutrin XL 150 mg daily. The burning sensation in her neck may well be from early shingles so we agreed to treat with 7 days of Valtrex 1000 mg tid.  Alysia Penna, MD

## 2016-12-22 NOTE — Progress Notes (Signed)
Pre visit review using our clinic review tool, if applicable. No additional management support is needed unless otherwise documented below in the visit note. 

## 2016-12-30 ENCOUNTER — Other Ambulatory Visit: Payer: Self-pay | Admitting: Family Medicine

## 2017-01-01 ENCOUNTER — Telehealth: Payer: Self-pay | Admitting: Family Medicine

## 2017-01-01 NOTE — Telephone Encounter (Signed)
Call in #30 with 5 rf 

## 2017-01-01 NOTE — Telephone Encounter (Signed)
Pt state that she is not able to take valacyclovir 1000 MG it is giving her diarrhea, severe stomach cramps and extra gas and would like to have something different to be called in place of this.  Pt all state that she had a very dry cough and thinks it is coming from her CHF and would like to have something to assist with the cough.   Pt need new Rx for zolpidem   Pharm:  [facility] [address] has only 2 or 3 tablet on hand.

## 2017-01-01 NOTE — Telephone Encounter (Signed)
Tell her to take 1/2 tab of the Valtrex at a time. Call in Benzonatate 200 mg to take bid for cough, #60 with 5 rf. We answered the Zolpidem on a separate  note

## 2017-01-02 MED ORDER — BENZONATATE 200 MG PO CAPS: 200.0000 mg | ORAL_CAPSULE | Freq: Two times a day (BID) | ORAL | 5 refills | Status: DC | PRN

## 2017-01-02 NOTE — Telephone Encounter (Signed)
I spoke with pt, went over below advice and sent script for Benzonatate e-scribe, called in Zolpidem to [facility].

## 2017-01-16 ENCOUNTER — Ambulatory Visit (INDEPENDENT_AMBULATORY_CARE_PROVIDER_SITE_OTHER): Payer: Medicare Other

## 2017-01-16 ENCOUNTER — Ambulatory Visit (INDEPENDENT_AMBULATORY_CARE_PROVIDER_SITE_OTHER): Payer: Medicare Other | Admitting: Adult Health

## 2017-01-16 ENCOUNTER — Encounter: Payer: Self-pay | Admitting: Adult Health

## 2017-01-16 VITALS — BP 158/70 | HR 83 | Temp 98.1°F | Ht 68.0 in | Wt 250.0 lb

## 2017-01-16 VITALS — BP 158/70 | HR 83 | Temp 98.1°F | Ht 69.0 in | Wt 248.0 lb

## 2017-01-16 DIAGNOSIS — Z0000 Encounter for general adult medical examination without abnormal findings: Secondary | ICD-10-CM

## 2017-01-16 DIAGNOSIS — R059 Cough, unspecified: Secondary | ICD-10-CM

## 2017-01-16 DIAGNOSIS — R05 Cough: Secondary | ICD-10-CM

## 2017-01-16 DIAGNOSIS — R0602 Shortness of breath: Secondary | ICD-10-CM | POA: Diagnosis not present

## 2017-01-16 MED ORDER — HYDROCODONE-HOMATROPINE 5-1.5 MG/5ML PO SYRP: 5.0000 mL | ORAL_SOLUTION | Freq: Three times a day (TID) | ORAL | 0 refills | Status: DC | PRN

## 2017-01-16 MED ORDER — ALBUTEROL SULFATE HFA 108 (90 BASE) MCG/ACT IN AERS: 2.0000 | INHALATION_SPRAY | RESPIRATORY_TRACT | 3 refills | Status: DC | PRN

## 2017-01-16 MED ORDER — FLUTICASONE-SALMETEROL 250-50 MCG/DOSE IN AEPB: 1.0000 | INHALATION_SPRAY | Freq: Two times a day (BID) | RESPIRATORY_TRACT | 6 refills | Status: DC

## 2017-01-16 NOTE — Patient Instructions (Addendum)
[name] , Thank you for taking time to come for your Medicare Wellness Visit. I appreciate your ongoing commitment to your health goals. Please review the following plan we discussed and let me know if I can assist you in the future.   These are the goals we discussed: not feeling well; deferred to [facility] to cancel AWV but requested to complete while she is here Apt scheduled with [facility] postponed for now  Will call [facility] program tomorrow for referral if still available  Provide community support for meds and self care   Goals    None      This is a list of the screening recommended for you and due dates:  Health Maintenance  Topic Date Due  . DEXA scan (bone density measurement)  07/24/2000  . Pneumonia vaccines (1 of 2 - PCV13) 07/24/2000  . Complete foot exam   04/29/2015  . Eye exam for diabetics  04/06/2017  . Hemoglobin A1C  04/09/2017  . Flu Shot  05/02/2017  . Tetanus Vaccine  03/29/2026   Health Maintenance, Female Adopting a healthy lifestyle and getting preventive care can go a long way to promote health and wellness. Talk with your health care provider about what schedule of regular examinations is right for you. This is a good chance for you to check in with your provider about disease prevention and staying healthy. In between checkups, there are plenty of things you can do on your own. Experts have done a lot of research about which lifestyle changes and preventive measures are most likely to keep you healthy. Ask your health care provider for more information. Weight and diet Eat a healthy diet  Be sure to include plenty of vegetables, fruits, low-fat dairy products, and lean protein.  Do not eat a lot of foods high in solid fats, added sugars, or salt.  Get regular exercise. This is one of the most important things you can do for your health.  Most adults should exercise for at least 150 minutes each week. The exercise should increase your heart rate  and make you sweat (moderate-intensity exercise).  Most adults should also do strengthening exercises at least twice a week. This is in addition to the moderate-intensity exercise. Maintain a healthy weight  Body mass index (BMI) is a measurement that can be used to identify possible weight problems. It estimates body fat based on height and weight. Your health care provider can help determine your BMI and help you achieve or maintain a healthy weight.  For females 37 years of age and older:  A BMI below 18.5 is considered underweight.  A BMI of 18.5 to 24.9 is normal.  A BMI of 25 to 29.9 is considered overweight.  A BMI of 30 and above is considered obese. Watch levels of cholesterol and blood lipids  You should start having your blood tested for lipids and cholesterol at 81 years of age, then have this test every 5 years.  You may need to have your cholesterol levels checked more often if:  Your lipid or cholesterol levels are high.  You are older than 81 years of age.  You are at high risk for heart disease. Cancer screening Lung Cancer  Lung cancer screening is recommended for adults 73-64 years old who are at high risk for lung cancer because of a history of smoking.  A yearly low-dose CT scan of the lungs is recommended for people who:  Currently smoke.  Have quit within  the past 15 years.  Have at least a 30-pack-year history of smoking. A pack year is smoking an average of one pack of cigarettes a day for 1 year.  Yearly screening should continue until it has been 15 years since you quit.  Yearly screening should stop if you develop a health problem that would prevent you from having lung cancer treatment. Breast Cancer  Practice breast self-awareness. This means understanding how your breasts normally appear and feel.  It also means doing regular breast self-exams. Let your health care provider know about any changes, no matter how small.  If you are in your  20s or 30s, you should have a clinical breast exam (CBE) by a health care provider every 1-3 years as part of a regular health exam.  If you are 13 or older, have a CBE every year. Also consider having a breast X-ray (mammogram) every year.  If you have a family history of breast cancer, talk to your health care provider about genetic screening.  If you are at high risk for breast cancer, talk to your health care provider about having an MRI and a mammogram every year.  Breast cancer gene (BRCA) assessment is recommended for women who have family members with BRCA-related cancers. BRCA-related cancers include:  Breast.  Ovarian.  Tubal.  Peritoneal cancers.  Results of the assessment will determine the need for genetic counseling and BRCA1 and BRCA2 testing. Cervical Cancer  Your health care provider may recommend that you be screened regularly for cancer of the pelvic organs (ovaries, uterus, and vagina). This screening involves a pelvic examination, including checking for microscopic changes to the surface of your cervix (Pap test). You may be encouraged to have this screening done every 3 years, beginning at age 43.  For women ages 32-65, health care providers may recommend pelvic exams and Pap testing every 3 years, or they may recommend the Pap and pelvic exam, combined with testing for human papilloma virus (HPV), every 5 years. Some types of HPV increase your risk of cervical cancer. Testing for HPV may also be done on women of any age with unclear Pap test results.  Other health care providers may not recommend any screening for nonpregnant women who are considered low risk for pelvic cancer and who do not have symptoms. Ask your health care provider if a screening pelvic exam is right for you.  If you have had past treatment for cervical cancer or a condition that could lead to cancer, you need Pap tests and screening for cancer for at least 20 years after your treatment. If Pap  tests have been discontinued, your risk factors (such as having a new sexual partner) need to be reassessed to determine if screening should resume. Some women have medical problems that increase the chance of getting cervical cancer. In these cases, your health care provider may recommend more frequent screening and Pap tests. Colorectal Cancer  This type of cancer can be detected and often prevented.  Routine colorectal cancer screening usually begins at 81 years of age and continues through 81 years of age.  Your health care provider may recommend screening at an earlier age if you have risk factors for colon cancer.  Your health care provider may also recommend using home test kits to check for hidden blood in the stool.  A small camera at the end of a tube can be used to examine your colon directly (sigmoidoscopy or colonoscopy). This is done to check for the earliest  forms of colorectal cancer.  Routine screening usually begins at age 27.  Direct examination of the colon should be repeated every 5-10 years through 81 years of age. However, you may need to be screened more often if early forms of precancerous polyps or small growths are found. Skin Cancer  Check your skin from head to toe regularly.  Tell your health care provider about any new moles or changes in moles, especially if there is a change in a mole's shape or color.  Also tell your health care provider if you have a mole that is larger than the size of a pencil eraser.  Always use sunscreen. Apply sunscreen liberally and repeatedly throughout the day.  Protect yourself by wearing long sleeves, pants, a wide-brimmed hat, and sunglasses whenever you are outside. Heart disease, diabetes, and high blood pressure  High blood pressure causes heart disease and increases the risk of stroke. High blood pressure is more likely to develop in:  People who have blood pressure in the high end of the normal range (130-139/85-89 mm  Hg).  People who are overweight or obese.  People who are African American.  If you are 78-54 years of age, have your blood pressure checked every 3-5 years. If you are 24 years of age or older, have your blood pressure checked every year. You should have your blood pressure measured twice-once when you are at a hospital or clinic, and once when you are not at a hospital or clinic. Record the average of the two measurements. To check your blood pressure when you are not at a hospital or clinic, you can use:  An automated blood pressure machine at a pharmacy.  A home blood pressure monitor.  If you are between 75 years and 30 years old, ask your health care provider if you should take aspirin to prevent strokes.  Have regular diabetes screenings. This involves taking a blood sample to check your fasting blood sugar level.  If you are at a normal weight and have a low risk for diabetes, have this test once every three years after 81 years of age.  If you are overweight and have a high risk for diabetes, consider being tested at a younger age or more often. Preventing infection Hepatitis B  If you have a higher risk for hepatitis B, you should be screened for this virus. You are considered at high risk for hepatitis B if:  You were born in a country where hepatitis B is common. Ask your health care provider which countries are considered high risk.  Your parents were born in a high-risk country, and you have not been immunized against hepatitis B (hepatitis B vaccine).  You have HIV or AIDS.  You use needles to inject street drugs.  You live with someone who has hepatitis B.  You have had sex with someone who has hepatitis B.  You get hemodialysis treatment.  You take certain medicines for conditions, including cancer, organ transplantation, and autoimmune conditions. Hepatitis C  Blood testing is recommended for:  Everyone born from [birth year] through 1963.  Anyone with known  risk factors for hepatitis C. Sexually transmitted infections (STIs)  You should be screened for sexually transmitted infections (STIs) including gonorrhea and chlamydia if:  You are sexually active and are younger than 81 years of age.  You are older than 81 years of age and your health care provider tells you that you are at risk for this type of infection.  Your sexual  activity has changed since you were last screened and you are at an increased risk for chlamydia or gonorrhea. Ask your health care provider if you are at risk.  If you do not have HIV, but are at risk, it may be recommended that you take a prescription medicine daily to prevent HIV infection. This is called pre-exposure prophylaxis (PrEP). You are considered at risk if:  You are sexually active and do not regularly use condoms or know the HIV status of your partner(s).  You take drugs by injection.  You are sexually active with a partner who has HIV. Talk with your health care provider about whether you are at high risk of being infected with HIV. If you choose to begin PrEP, you should first be tested for HIV. You should then be tested every 3 months for as long as you are taking PrEP. Pregnancy  If you are premenopausal and you may become pregnant, ask your health care provider about preconception counseling.  If you may become pregnant, take 400 to 800 micrograms (mcg) of folic acid every day.  If you want to prevent pregnancy, talk to your health care provider about birth control (contraception). Osteoporosis and menopause  Osteoporosis is a disease in which the bones lose minerals and strength with aging. This can result in serious bone fractures. Your risk for osteoporosis can be identified using a bone density scan.  If you are 60 years of age or older, or if you are at risk for osteoporosis and fractures, ask your health care provider if you should be screened.  Ask your health care provider whether you  should take a calcium or vitamin D supplement to lower your risk for osteoporosis.  Menopause may have certain physical symptoms and risks.  Hormone replacement therapy may reduce some of these symptoms and risks. Talk to your health care provider about whether hormone replacement therapy is right for you. Follow these instructions at home:  Schedule regular health, dental, and eye exams.  Stay current with your immunizations.  Do not use any tobacco products including cigarettes, chewing tobacco, or electronic cigarettes.  If you are pregnant, do not drink alcohol.  If you are breastfeeding, limit how much and how often you drink alcohol.  Limit alcohol intake to no more than 1 drink per day for nonpregnant women. One drink equals 12 ounces of beer, 5 ounces of wine, or 1 ounces of hard liquor.  Do not use street drugs.  Do not share needles.  Ask your health care provider for help if you need support or information about quitting drugs.  Tell your health care provider if you often feel depressed.  Tell your health care provider if you have ever been abused or do not feel safe at home. This information is not intended to replace advice given to you by your health care provider. Make sure you discuss any questions you have with your health care provider. Document Released: 04/03/2011 Document Revised: 02/24/2016 Document Reviewed: 06/22/2015 Elsevier Interactive Patient Education  2017 [place] Prevention in the Home Falls can cause injuries and can affect people from all age groups. There are many simple things that you can do to make your home safe and to help prevent falls. What can I do on the outside of my home?  Regularly repair the edges of walkways and driveways and fix any cracks.  Remove high doorway thresholds.  Trim any shrubbery on the main path into your home.  Use  bright outdoor lighting.  Clear walkways of debris and clutter, including tools  and rocks.  Regularly check that handrails are securely fastened and in good repair. Both sides of any steps should have handrails.  Install guardrails along the edges of any raised decks or porches.  Have leaves, snow, and ice cleared regularly.  Use sand or salt on walkways during winter months.  In the garage, clean up any spills right away, including grease or oil spills. What can I do in the bathroom?  Use night lights.  Install grab bars by the toilet and in the tub and shower. Do not use towel bars as grab bars.  Use non-skid mats or decals on the floor of the tub or shower.  If you need to sit down while you are in the shower, use a plastic, non-slip stool.  Keep the floor dry. Immediately clean up any water that spills on the floor.  Remove soap buildup in the tub or shower on a regular basis.  Attach bath mats securely with double-sided non-slip rug tape.  Remove throw rugs and other tripping hazards from the floor. What can I do in the bedroom?  Use night lights.  Make sure that a bedside light is easy to reach.  Do not use oversized bedding that drapes onto the floor.  Have a firm chair that has side arms to use for getting dressed.  Remove throw rugs and other tripping hazards from the floor. What can I do in the kitchen?  Clean up any spills right away.  Avoid walking on wet floors.  Place frequently used items in easy-to-reach places.  If you need to reach for something above you, use a sturdy step stool that has a grab bar.  Keep electrical cables out of the way.  Do not use floor polish or wax that makes floors slippery. If you have to use wax, make sure that it is non-skid floor wax.  Remove throw rugs and other tripping hazards from the floor. What can I do in the stairways?  Do not leave any items on the stairs.  Make sure that there are handrails on both sides of the stairs. Fix handrails that are broken or loose. Make sure that handrails  are as long as the stairways.  Check any carpeting to make sure that it is firmly attached to the stairs. Fix any carpet that is loose or worn.  Avoid having throw rugs at the top or bottom of stairways, or secure the rugs with carpet tape to prevent them from moving.  Make sure that you have a light switch at the top of the stairs and the bottom of the stairs. If you do not have them, have them installed. What are some other fall prevention tips?  Wear closed-toe shoes that fit well and support your feet. Wear shoes that have rubber soles or low heels.  When you use a stepladder, make sure that it is completely opened and that the sides are firmly locked. Have someone hold the ladder while you are using it. Do not climb a closed stepladder.  Add color or contrast paint or tape to grab bars and handrails in your home. Place contrasting color strips on the first and last steps.  Use mobility aids as needed, such as canes, walkers, scooters, and crutches.  Turn on lights if it is dark. Replace any light bulbs that burn out.  Set up furniture so that there are clear paths. Keep the furniture in the  same spot.  Fix any uneven floor surfaces.  Choose a carpet design that does not hide the edge of steps of a stairway.  Be aware of any and all pets.  Review your medicines with your healthcare provider. Some medicines can cause dizziness or changes in blood pressure, which increase your risk of falling. Talk with your health care provider about other ways that you can decrease your risk of falls. This may include working with a physical therapist or trainer to improve your strength, balance, and endurance. This information is not intended to replace advice given to you by your health care provider. Make sure you discuss any questions you have with your health care provider. Document Released: 09/08/2002 Document Revised: 02/15/2016 Document Reviewed: 10/23/2014 Elsevier Interactive Patient  Education  2017 [employer].

## 2017-01-16 NOTE — Progress Notes (Signed)
Subjective:    Patient ID: Melanie Mcdonald, female    DOB: [birth year], 83 y.o.   MRN: [id number]  HPI  83 year old female who  has a past medical history of ANEMIA (01/30/2008); ARTHRITIS (12/11/2007); BACK PAIN, CHRONIC (06/23/2008); BREAST CYST, RIGHT (12/17/2007); Colon polyps; CONTACT DERMATITIS (03/10/2009); COPD (chronic obstructive pulmonary disease) ([place]); CORONARY ARTERY DISEASE (03/01/2007); CYSTOCELE WITHOUT MENTION UTERINE PROLAPSE LAT (09/12/2010); DEGENERATIVE JOINT DISEASE (08/07/2007); DEPRESSION (03/01/2007); DIABETES MELLITUS, TYPE II (08/26/2008); Diverticulosis of colon (without mention of hemorrhage); Esophageal reflux (11/06/2007); HYPERKERATOSIS (06/02/2009); HYPERLIPIDEMIA (08/26/2008); HYPERTENSION (03/01/2007); Irritable bowel syndrome (01/26/2009); LABYRINTHITIS (11/03/2009); Narcolepsy without cataplexy(347.00) (08/26/2008); NEPHROLITHIASIS (04/29/2009); OBESITY (03/01/2007); PERIPHERAL VASCULAR DISEASE (01/26/2009); SYNCOPE (08/26/2008); and TRANSIENT ISCHEMIC ATTACK (10/20/2009).  She presents to the office today for dry cough x " months". She reports that the cough is worse when she lies down and when she talks.   She has not taken her lasix for the last two days as she " wanted to go to church and she knew she was coming here."   She denies any fevers.   She has been out of Ventolin and Advair - I will send this in   Review of Systems See HPI  Past Medical History:  Diagnosis Date  . ANEMIA 01/30/2008  . ARTHRITIS 12/11/2007  . BACK PAIN, CHRONIC 06/23/2008  . BREAST CYST, RIGHT 12/17/2007  . Colon polyps    FRAGMENTS OF HYPERPLASTIC POLYP  . CONTACT DERMATITIS 03/10/2009  . COPD (chronic obstructive pulmonary disease) ([place])   . CORONARY ARTERY DISEASE 03/01/2007   had a normal Myoview stress test 07-13-11  . CYSTOCELE WITHOUT MENTION UTERINE PROLAPSE LAT 09/12/2010  . DEGENERATIVE JOINT DISEASE 08/07/2007  . DEPRESSION 03/01/2007  . DIABETES MELLITUS, TYPE II 08/26/2008   sees  Dr. Dwyane Dee   . Diverticulosis of colon (without mention of hemorrhage)   . Esophageal reflux 11/06/2007  . HYPERKERATOSIS 06/02/2009  . HYPERLIPIDEMIA 08/26/2008  . HYPERTENSION 03/01/2007  . Irritable bowel syndrome 01/26/2009  . LABYRINTHITIS 11/03/2009  . 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 NEPHROLITHIASIS 04/29/2009  . OBESITY 03/01/2007  . PERIPHERAL VASCULAR DISEASE 01/26/2009  . SYNCOPE 08/26/2008   had brain MRI on 06-28-12 showing only chronic microvascular ischemia and atrophy   . TRANSIENT ISCHEMIC ATTACK 10/20/2009   had normal brain MRA with patent vertebrals and carotids 06-28-12    Social History   Social History  . Marital status: Widowed    Spouse name: N/A  . Number of children: N/A  . Years of education: N/A   Occupational History  . Not on file.   Social History Main Topics  . Smoking status: Never Smoker  . Smokeless tobacco: Never Used  . Alcohol use No  . Drug use: No  . Sexual activity: No   Other Topics Concern  . Not on file   Social History Narrative  . No narrative on file    Past Surgical History:  Procedure Laterality Date  . ABDOMINAL HYSTERECTOMY    . BACK SURGERY     x2  . CARDIAC CATHETERIZATION  07/2009  . CATARACT EXTRACTION    . COLONOSCOPY  11-13-07   per Dr. Deatra Ina, benign polyps, repeat in 5 yrs  . ESOPHAGOGASTRODUODENOSCOPY  11-13-07   per Dr. Deatra Ina, normal   . INTRAOCULAR LENS INSERTION    . SPINE SURGERY     x 3  . TONSILECTOMY, ADENOIDECTOMY, BILATERAL MYRINGOTOMY AND TUBES      Family History  Problem Relation Age of Onset  .  Lupus Mother   . COPD Father     Allergies  Allergen Reactions  . Aspirin Other (See Comments)    Abdominal pain  . Ciprofloxacin Nausea And Vomiting    Made pt very sick on stomach  . Clarithromycin     Made stomach burn  . Doxycycline Itching  . Erythromycin Nausea And Vomiting  . Lisinopril Cough  . Lyrica [Pregabalin] Other (See Comments)    Suicidal   . Macrobid  [Nitrofurantoin Monohyd Macro] Nausea And Vomiting  . Metolazone Other (See Comments)    Pt stated this made her BP drop  . Other     Pt cannot have any of the MYCIN's  . Penicillins Swelling    Has patient had a PCN reaction causing immediate rash, facial/tongue/throat swelling, SOB or lightheadedness with hypotension:  Has patient had a PCN reaction causing severe rash involving mucus membranes or skin necrosis:  Has patient had a PCN reaction that required hospitalization  Has patient had a PCN reaction occurring within the last 10 years:  If all of the above answers are "NO", then may proceed with Cephalosporin use. Childhood allergy  . Sulfonamide Derivatives Nausea And Vomiting  . Valtrex [Valacyclovir Hcl] Nausea Only    Current Outpatient Prescriptions on File Prior to Visit  Medication Sig Dispense Refill  . albuterol (PROVENTIL HFA;VENTOLIN HFA) 108 (90 BASE) MCG/ACT inhaler Inhale 2 puffs into the lungs every 4 (four) hours as needed for wheezing or shortness of breath. 1 Inhaler 3  . aspirin EC 81 MG EC tablet Take 1 tablet (81 mg total) by mouth daily. 30 tablet 1  . benzonatate (TESSALON) 200 MG capsule Take 1 capsule (200 mg total) by mouth 2 (two) times daily as needed for cough. 60 capsule 5  . buPROPion (WELLBUTRIN XL) 150 MG 24 hr tablet Take 1 tablet (150 mg total) by mouth daily. 90 tablet 3  . cyclobenzaprine (FLEXERIL) 10 MG tablet Take 1 tablet (10 mg total) by mouth at bedtime. 30 tablet 5  . dextroamphetamine (DEXEDRINE SPANSULE) 10 MG 24 hr capsule Take 2 capsules (20 mg total) by mouth daily. 60 capsule 0  . ENSURE (ENSURE) Take 237 mLs by mouth 2 (two) times daily as needed (poor appetite). Reported on 02/08/2016    . Fluticasone-Salmeterol (ADVAIR DISKUS) 250-50 MCG/DOSE AEPB Inhale 1 puff into the lungs 2 (two) times daily. 60 each 6  . furosemide (LASIX) 40 MG tablet Take 1 tablet (40 mg total) by mouth 2 (two) times daily. TAKE 1 IN THE MORNING AND ONE IN THE  EVENING (Patient taking differently: Take 40 mg by mouth 2 (two) times daily. ) 60 tablet 11  . gabapentin (NEURONTIN) 300 MG capsule Take 1 capsule (300 mg total) by mouth 3 (three) times daily. (Patient taking differently: Take 300 mg by mouth 3 (three) times daily as needed (nerve pain). ) 90 capsule 5  . glucose blood (ACCU-CHEK AVIVA) test strip Test once per day and diagnosis code is E 11.9 100 each 1  . indomethacin (INDOCIN) 50 MG capsule Take 1 capsule (50 mg total) by mouth 3 (three) times daily as needed for moderate pain. 270 capsule 3  . Insulin Pen Needle (NOVOFINE) 30G X 8 MM MISC USE TO CHECK BLOOD SUGAR TWICE A DAY 180 each 3  . insulin regular human CONCENTRATED (HUMULIN R U-500 KWIKPEN) 500 UNIT/ML kwikpen Take 60units at breakfast 50 at lunch and 50 at supper (Patient taking differently: Take 70units at breakfast 30 at lunch  and 30 at supper) 1 pen 12  . ketoconazole (NIZORAL) 2 % cream Apply 1 application topically 2 (two) times daily as needed for irritation (between bikini lines).    . LORazepam (ATIVAN) 1 MG tablet Take 1 tablet (1 mg total) by mouth 2 (two) times daily. 10 tablet 2  . losartan (COZAAR) 50 MG tablet Take 1 tablet (50 mg total) by mouth daily. 90 tablet 3  . nitroGLYCERIN (NITROSTAT) 0.4 MG SL tablet Place 1 tablet (0.4 mg total) under the tongue every 5 (five) minutes as needed for chest pain. 50 tablet 5  . ondansetron (ZOFRAN) 8 MG tablet Take 1 tablet (8 mg total) by mouth every 8 (eight) hours as needed for nausea or vomiting. 60 tablet 5  . oxyCODONE-acetaminophen (PERCOCET) 10-325 MG tablet Take 1 tablet by mouth every 6 (six) hours as needed for pain. 120 tablet 0  . pramipexole (MIRAPEX) 1 MG tablet TAKE 5mg =5 TABLETS BY MOUTH daily at bedtime 540 tablet 1  . scopolamine (TRANSDERM-SCOP, 1.5 MG,) 1 MG/3DAYS Place 1 patch (1.5 mg total) onto the skin every 3 (three) days. 10 patch 11  . valACYclovir (VALTREX) 1000 MG tablet Take 1 tablet (1,000 mg total)  by mouth 3 (three) times daily. 21 tablet 0  . zolpidem (AMBIEN) 10 MG tablet TAKE 1 TABLET BY MOUTH EVERY NIGHT AT BEDTIME 30 tablet 5   No current facility-administered medications on file prior to visit.     BP (!) 158/70   Pulse 83   Temp 98.1 F (36.7 C)   Ht 5\' 9"  (1.753 m)   Wt 248 lb (112.5 kg)   SpO2 96%   BMI 36.62 kg/m       Objective:   Physical Exam  Constitutional: She is oriented to person, place, and time. She appears well-developed and well-nourished. No distress.  Cardiovascular: Normal rate, regular rhythm, normal heart sounds and intact distal pulses.  Exam reveals no gallop and no friction rub.   No murmur heard. Pulmonary/Chest: Effort normal and breath sounds normal. No respiratory distress. She has no wheezes. She has no rales. She exhibits no tenderness.  Musculoskeletal: She exhibits edema (+ 2 pitting edema in lower extremities ).  Neurological: She is alert and oriented to person, place, and time.  Skin: She is not diaphoretic.  Nursing note and vitals reviewed.     Assessment & Plan:  1. Cough - Lung sounds are clear and she does not sound fluid overloaded - HYDROcodone-homatropine (HYCODAN) 5-1.5 MG/5ML syrup; Take 5 mLs by mouth every 8 (eight) hours as needed for cough.  Dispense: 120 mL; Refill: 0 - Follow up with PCP as needed  2. SOB (shortness of breath) - albuterol (PROVENTIL HFA;VENTOLIN HFA) 108 (90 Base) MCG/ACT inhaler; Inhale 2 puffs into the lungs every 4 (four) hours as needed for wheezing or shortness of breath.  Dispense: 1 Inhaler; Refill: 3 - Fluticasone-Salmeterol (ADVAIR DISKUS) 250-50 MCG/DOSE AEPB; Inhale 1 puff into the lungs 2 (two) times daily.  Dispense: 60 each; Refill: 6   Dorothyann Peng, NP

## 2017-01-16 NOTE — Progress Notes (Signed)
Pre visit review using our clinic review tool, if applicable. No additional management support is needed unless otherwise documented below in the visit note. 

## 2017-01-16 NOTE — Progress Notes (Addendum)
Subjective:   Melanie Mcdonald is a 83 y.o. female who presents for Medicare Annual (Subsequent) preventive examination.  The Patient was informed that the wellness visit is to identify future health risk and educate and initiate measures that can reduce risk for increased disease through the lifespan.    NO ROS; Medicare Wellness Visit Was independent , mowing etc about a one year [place] and broke her ankle;' PT out due to recent TIAs  On admission, seemed to have difficulty getting to the exam room  c/o of sob; coughing more with no sputum States she needs albuterol but does not have it;  "states breathing doesn't do her any good" has on energy. Has not had fluid pill today or yesterday; due to going to church and AWV today;  Then stated she took one at 4am today  Ankles and legs grossly edematous but states this is normal when she doesn't take her lasix Has not checked BS today - takes Humulin 60 in am and 30 at lunch and supper. Has taken insulin today; Took ntg x 2 weeks ago; not taking BP med due to BP dropping c/o of min strokes a week  Hospitalized in January of 2018   Today she had ensure and and small amt of chicken salad  Small glass of milk   Describes health as good, fair or great?  Challenged  Not taking meds;  Not taking her med for sleepiness and wasn't sure what it was for  Labs 06/2016   Preventive Screening -Counseling & Management  Colonoscopy 11/2007 aged out Mammogram 07/2011 dexa; no report found- waive at 36 due to other medical issues. Multiple falls and no fx   Functional status deferred due to weak today and needing help in the home;   Smoking history - never smoked  ETOH - NO or very seldom  Medication adherence or issues?  States valtrex made her sick; added to allergy list  Not taking some of her meds; states she can afford inhalers but  has not had them filled; Recommended she get them filled and take her meds as ordered    RISK  FACTORS Diet not eating as much  Regular exercise working on exercises from PT Spine surgery x 3   Cardiac Risk Factors:  Cardiac Cath 11/2007  Advanced aged  >65 in women Hyperlipidemia - ratio chol/hdl 2.6 HDL 48; Trig 80 Diabetes- 10/2016 a1c 9.6; (was hospitalized at this time)  BS 06/2016 was 92; elevated a1c around the time of her hospitalization Family History -lupus and COPD  Obesity BMI 26  Fall risk  Not in the last few weeks Has had PT in for strengthening; gave her exercises  Does some of them part of the time Given education on "Fall Prevention in the Home" for more safety tips the patient can apply as appropriate.   Mobility of Functional changes this year? yes Safety; lives with son;  [place]car[place] Drought may stay on the weekends  Does not have help during the day   Mental Health: still feels depressed and but taking medication Treatment underway  Who would help you with chores; illness; shopping other?  Son helps and gardner on the weekends  Eye exam- diabetic eye exam 04/2016   Activities of Daily Living - See functional screen   Cognitive testing; Ad8 score; 0 or less than 2  MMSE deferred or completed if AD8 + 2 issues  Advanced Directives completed   Alysia Penna, MD   Immunization History  Administered  Date(s) Administered  . Influenza Split 07/14/2011, 06/02/2012  . Influenza Whole 08/07/2007, 07/07/2009, 05/25/2010  . Influenza,inj,Quad PF,36+ Mos 06/26/2014, 06/13/2016  . Influenza-Unspecified 08/03/2015  . Td 03/29/2016  . Zoster 06/24/2010   Required Immunizations needed today  Screening test up to date or reviewed for plan of completion Health Maintenance Due  Topic Date Due  . DEXA SCAN  07/24/2000  . PNA vac Low Risk Adult (1 of 2 - PCV13) 07/24/2000  . FOOT EXAM  04/29/2015    Immun up to date x for Pneumonia series; (allergic to mycine's")  Deferred today as she is not feeling well;   To discuss Dexa but made an acute visit for fup of  her complaints and fatigue   To discuss Foot exam deferred due to acute illness         Objective:     Vitals: BP (!) 158/70   Pulse 83   Temp 98.1 F (36.7 C)   Ht 5\' 8"  (1.727 m)   Wt 250 lb (113.4 kg)   SpO2 96%   BMI 38.01 kg/m   Body mass index is 38.01 kg/m.   Tobacco History  Smoking Status  . Never Smoker  Smokeless Tobacco  . Never Used     Counseling given: Yes   Past Medical History:  Diagnosis Date  . ANEMIA 01/30/2008  . ARTHRITIS 12/11/2007  . BACK PAIN, CHRONIC 06/23/2008  . BREAST CYST, RIGHT 12/17/2007  . Colon polyps    FRAGMENTS OF HYPERPLASTIC POLYP  . CONTACT DERMATITIS 03/10/2009  . COPD (chronic obstructive pulmonary disease) ([place])   . CORONARY ARTERY DISEASE 03/01/2007   had a normal Myoview stress test 07-13-11  . CYSTOCELE WITHOUT MENTION UTERINE PROLAPSE LAT 09/12/2010  . DEGENERATIVE JOINT DISEASE 08/07/2007  . DEPRESSION 03/01/2007  . DIABETES MELLITUS, TYPE II 08/26/2008   sees Dr. Dwyane Dee   . Diverticulosis of colon (without mention of hemorrhage)   . Esophageal reflux 11/06/2007  . HYPERKERATOSIS 06/02/2009  . HYPERLIPIDEMIA 08/26/2008  . HYPERTENSION 03/01/2007  . Irritable bowel syndrome 01/26/2009  . LABYRINTHITIS 11/03/2009  . 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 NEPHROLITHIASIS 04/29/2009  . OBESITY 03/01/2007  . PERIPHERAL VASCULAR DISEASE 01/26/2009  . SYNCOPE 08/26/2008   had brain MRI on 06-28-12 showing only chronic microvascular ischemia and atrophy   . TRANSIENT ISCHEMIC ATTACK 10/20/2009   had normal brain MRA with patent vertebrals and carotids 06-28-12   Past Surgical History:  Procedure Laterality Date  . ABDOMINAL HYSTERECTOMY    . BACK SURGERY     x2  . CARDIAC CATHETERIZATION  07/2009  . CATARACT EXTRACTION    . COLONOSCOPY  11-13-07   per Dr. Deatra Ina, benign polyps, repeat in 5 yrs  . ESOPHAGOGASTRODUODENOSCOPY  11-13-07   per Dr. Deatra Ina, normal   . INTRAOCULAR LENS INSERTION    . SPINE SURGERY     x 3   . TONSILECTOMY, ADENOIDECTOMY, BILATERAL MYRINGOTOMY AND TUBES     Family History  Problem Relation Age of Onset  . Lupus Mother   . COPD Father    History  Sexual Activity  . Sexual activity: No    Outpatient Encounter Prescriptions as of 01/16/2017  Medication Sig  . buPROPion (WELLBUTRIN XL) 150 MG 24 hr tablet Take 1 tablet (150 mg total) by mouth daily.  Marland Kitchen ENSURE (ENSURE) Take 237 mLs by mouth 2 (two) times daily as needed (poor appetite). Reported on 02/08/2016  . furosemide (LASIX) 40 MG tablet Take 1 tablet (40 mg  total) by mouth 2 (two) times daily. TAKE 1 IN THE MORNING AND ONE IN THE EVENING (Patient taking differently: Take 40 mg by mouth 2 (two) times daily. )  . gabapentin (NEURONTIN) 300 MG capsule Take 1 capsule (300 mg total) by mouth 3 (three) times daily. (Patient taking differently: Take 300 mg by mouth 3 (three) times daily as needed (nerve pain). )  . glucose blood (ACCU-CHEK AVIVA) test strip Test once per day and diagnosis code is E 11.9  . indomethacin (INDOCIN) 50 MG capsule Take 1 capsule (50 mg total) by mouth 3 (three) times daily as needed for moderate pain.  . Insulin Pen Needle (NOVOFINE) 30G X 8 MM MISC USE TO CHECK BLOOD SUGAR TWICE A DAY  . insulin regular human CONCENTRATED (HUMULIN R U-500 KWIKPEN) 500 UNIT/ML kwikpen Take 60units at breakfast 50 at lunch and 50 at supper (Patient taking differently: Take 70units at breakfast 30 at lunch and 30 at supper)  . ketoconazole (NIZORAL) 2 % cream Apply 1 application topically 2 (two) times daily as needed for irritation (between bikini lines).  . nitroGLYCERIN (NITROSTAT) 0.4 MG SL tablet Place 1 tablet (0.4 mg total) under the tongue every 5 (five) minutes as needed for chest pain.  Marland Kitchen ondansetron (ZOFRAN) 8 MG tablet Take 1 tablet (8 mg total) by mouth every 8 (eight) hours as needed for nausea or vomiting.  Marland Kitchen oxyCODONE-acetaminophen (PERCOCET) 10-325 MG tablet Take 1 tablet by mouth every 6 (six) hours as  needed for pain.  . pramipexole (MIRAPEX) 1 MG tablet TAKE 5mg =5 TABLETS BY MOUTH daily at bedtime  . zolpidem (AMBIEN) 10 MG tablet TAKE 1 TABLET BY MOUTH EVERY NIGHT AT BEDTIME  . [DISCONTINUED] albuterol (PROVENTIL HFA;VENTOLIN HFA) 108 (90 BASE) MCG/ACT inhaler Inhale 2 puffs into the lungs every 4 (four) hours as needed for wheezing or shortness of breath.  Marland Kitchen aspirin EC 81 MG EC tablet Take 1 tablet (81 mg total) by mouth daily.  . benzonatate (TESSALON) 200 MG capsule Take 1 capsule (200 mg total) by mouth 2 (two) times daily as needed for cough.  . cyclobenzaprine (FLEXERIL) 10 MG tablet Take 1 tablet (10 mg total) by mouth at bedtime.  Marland Kitchen dextroamphetamine (DEXEDRINE SPANSULE) 10 MG 24 hr capsule Take 2 capsules (20 mg total) by mouth daily.  Marland Kitchen LORazepam (ATIVAN) 1 MG tablet Take 1 tablet (1 mg total) by mouth 2 (two) times daily.  Marland Kitchen losartan (COZAAR) 50 MG tablet Take 1 tablet (50 mg total) by mouth daily.  Marland Kitchen scopolamine (TRANSDERM-SCOP, 1.5 MG,) 1 MG/3DAYS Place 1 patch (1.5 mg total) onto the skin every 3 (three) days.  . valACYclovir (VALTREX) 1000 MG tablet Take 1 tablet (1,000 mg total) by mouth 3 (three) times daily.  . [DISCONTINUED] Fluticasone-Salmeterol (ADVAIR DISKUS) 250-50 MCG/DOSE AEPB Inhale 1 puff into the lungs 2 (two) times daily.   No facility-administered encounter medications on file as of 01/16/2017.     Activities of Daily Living In your present state of health, do you have any difficulty performing the following activities: 10/09/2016  Hearing? N  Vision? N  Difficulty concentrating or making decisions? N  Walking or climbing stairs? Y  Dressing or bathing? Y  Doing errands, shopping? Y  Some recent data might be hidden    Patient Care Team: Laurey Morale, MD as PCP - General (Family Medicine)    Assessment:     Exercise Activities and Dietary recommendations Per PT     Goals  None     Fall Risk Fall Risk  01/16/2017 01/16/2017 03/10/2016  01/19/2016 06/15/2014  Falls in the past year? Yes No Yes Yes Yes  Number falls in past yr: - - 1 1 2  or more  Injury with Fall? - - Yes Yes -  Risk for fall due to : - - - - History of fall(s)  Risk for fall due to (comments): - - - - States she has had black out spells   Depression Screen PHQ 2/9 Scores 01/16/2017 01/16/2017 03/10/2016 06/15/2014  PHQ - 2 Score 1 0 1 2     Cognitive Function; denies issues but not taking care of herself; not taking some of her meds         Immunization History  Administered Date(s) Administered  . Influenza Split 07/14/2011, 06/02/2012  . Influenza Whole 08/07/2007, 07/07/2009, 05/25/2010  . Influenza,inj,Quad PF,36+ Mos 06/26/2014, 06/13/2016  . Influenza-Unspecified 08/03/2015  . Td 03/29/2016  . Zoster 06/24/2010   Screening Tests Health Maintenance  Topic Date Due  . DEXA SCAN  07/24/2000  . PNA vac Low Risk Adult (1 of 2 - PCV13) 07/24/2000  . FOOT EXAM  04/29/2015  . OPHTHALMOLOGY EXAM  04/06/2017  . HEMOGLOBIN A1C  04/09/2017  . INFLUENZA VACCINE  05/02/2017  . TETANUS/TDAP  03/29/2026      Plan:   PCP Notes  Health Maintenance Deferred her over due screens due to acute issues;  Not taking meds;  Doesn't understand how and why to take some of her meds Does not take lasix  Agreed to [facility] referral if they can see her; which provides medication oversight due to in[place] as well as help with daily care;   Foot exam deferred;  Pneumonia series deferred; multiples allergies and not feeling well today  Abnormal Screens VS normal; ankles and calves edematous today but did not take lasix. Referred to [facility]  Referrals apt made for acute care with [facility]   Patient concerns; states she wants to feel better but not taking her meds as ordered. Will try to get support in the home   Nurse Concerns;   Next PCP apt - referred to [facility] to see today    During the course of the visit the patient was educated and counseled about the  following appropriate screening and preventive services:   Vaccines to include Pneumoccal, Influenza, Hepatitis B, Td, Zostavax, HCV  Electrocardiogram  Cardiovascular Disease  Colorectal cancer screening  Bone density screening - dexa scan deferred   Diabetes screening  Glaucoma screening  Mammography/PAP  Nutrition counseling   Patient Instructions (the written plan) was given to the patient.   LFYBO,[id number], RN  01/16/2017   I have read this note and agree with its contents. Alysia Penna, MD

## 2017-01-19 ENCOUNTER — Telehealth: Payer: Self-pay

## 2017-01-19 NOTE — Telephone Encounter (Signed)
Outreach to Ms. Arthur Holms from [employer] called back STated the [facility] program is on hold The are not taking any new referrals.  [place] has an In [employer] but ? If this is for Medicaid  She has apt with Dr. Sarajane Jews for CPE Will see how she is doing and if she is taking her medicine Left message for call back

## 2017-01-19 NOTE — Telephone Encounter (Signed)
Call to the the public health dept at [phone] and left a message with Claiborne Billings regarding Ms. Leech and referral to [facility]  She was in for AWV. Wasn't taking lasix; on Insulin; had not picked up her inhalers but c/o of respiratory issues; did not seem to understand what her meds were for. Etc  Would like to refer to the [facility]

## 2017-02-07 ENCOUNTER — Telehealth: Payer: Self-pay

## 2017-02-07 NOTE — Telephone Encounter (Signed)
Received PA request from [facility] for Zolpidem. PA submitted & is pending. YBR:[id number].

## 2017-02-09 ENCOUNTER — Ambulatory Visit (INDEPENDENT_AMBULATORY_CARE_PROVIDER_SITE_OTHER): Payer: Medicare Other | Admitting: Family Medicine

## 2017-02-09 ENCOUNTER — Encounter: Payer: Self-pay | Admitting: Family Medicine

## 2017-02-09 VITALS — BP 150/83 | HR 73 | Temp 97.9°F | Ht 69.0 in | Wt 251.0 lb

## 2017-02-09 DIAGNOSIS — R2681 Unsteadiness on feet: Secondary | ICD-10-CM | POA: Diagnosis not present

## 2017-02-09 DIAGNOSIS — E119 Type 2 diabetes mellitus without complications: Secondary | ICD-10-CM | POA: Diagnosis not present

## 2017-02-09 DIAGNOSIS — R1031 Right lower quadrant pain: Secondary | ICD-10-CM | POA: Diagnosis not present

## 2017-02-09 DIAGNOSIS — F339 Major depressive disorder, recurrent, unspecified: Secondary | ICD-10-CM

## 2017-02-09 DIAGNOSIS — E1165 Type 2 diabetes mellitus with hyperglycemia: Secondary | ICD-10-CM | POA: Diagnosis not present

## 2017-02-09 DIAGNOSIS — G47419 Narcolepsy without cataplexy: Secondary | ICD-10-CM | POA: Diagnosis not present

## 2017-02-09 DIAGNOSIS — Z794 Long term (current) use of insulin: Secondary | ICD-10-CM | POA: Diagnosis not present

## 2017-02-09 DIAGNOSIS — R531 Weakness: Secondary | ICD-10-CM | POA: Diagnosis not present

## 2017-02-09 DIAGNOSIS — I10 Essential (primary) hypertension: Secondary | ICD-10-CM

## 2017-02-09 DIAGNOSIS — E782 Mixed hyperlipidemia: Secondary | ICD-10-CM

## 2017-02-09 DIAGNOSIS — N8110 Cystocele, unspecified: Secondary | ICD-10-CM

## 2017-02-09 DIAGNOSIS — G8929 Other chronic pain: Secondary | ICD-10-CM | POA: Diagnosis not present

## 2017-02-09 LAB — CBC WITH DIFFERENTIAL/PLATELET
BASOS ABS: 0 10*3/uL (ref 0.0–0.1)
BASOS PCT: 0.3 % (ref 0.0–3.0)
EOS ABS: 0.2 10*3/uL (ref 0.0–0.7)
Eosinophils Relative: 2.2 % (ref 0.0–5.0)
HCT: 38.1 % (ref 36.0–46.0)
Hemoglobin: 12.7 g/dL (ref 12.0–15.0)
LYMPHS ABS: 2.6 10*3/uL (ref 0.7–4.0)
Lymphocytes Relative: 32 % (ref 12.0–46.0)
MCHC: 33.4 g/dL (ref 30.0–36.0)
MCV: 88.5 fl (ref 78.0–100.0)
MONO ABS: 0.5 10*3/uL (ref 0.1–1.0)
Monocytes Relative: 6.4 % (ref 3.0–12.0)
NEUTROS ABS: 4.8 10*3/uL (ref 1.4–7.7)
NEUTROS PCT: 59.1 % (ref 43.0–77.0)
Platelets: 206 10*3/uL (ref 150.0–400.0)
RBC: 4.31 Mil/uL (ref 3.87–5.11)
RDW: 14.2 % (ref 11.5–15.5)
WBC: 8.1 10*3/uL (ref 4.0–10.5)

## 2017-02-09 LAB — HEPATIC FUNCTION PANEL
ALBUMIN: 3.8 g/dL (ref 3.5–5.2)
ALK PHOS: 75 U/L (ref 39–117)
ALT: 14 U/L (ref 0–35)
AST: 17 U/L (ref 0–37)
Bilirubin, Direct: 0.1 mg/dL (ref 0.0–0.3)
TOTAL PROTEIN: 7.2 g/dL (ref 6.0–8.3)
Total Bilirubin: 0.6 mg/dL (ref 0.2–1.2)

## 2017-02-09 LAB — POC URINALSYSI DIPSTICK (AUTOMATED)
BILIRUBIN UA: NEGATIVE
GLUCOSE UA: NEGATIVE
Ketones, UA: NEGATIVE
Leukocytes, UA: NEGATIVE
NITRITE UA: NEGATIVE
PH UA: 7 (ref 5.0–8.0)
Protein, UA: NEGATIVE
RBC UA: NEGATIVE
SPEC GRAV UA: 1.015 (ref 1.010–1.025)
UROBILINOGEN UA: 0.2 U/dL

## 2017-02-09 LAB — BASIC METABOLIC PANEL
BUN: 18 mg/dL (ref 6–23)
CALCIUM: 9.2 mg/dL (ref 8.4–10.5)
CO2: 30 meq/L (ref 19–32)
Chloride: 106 mEq/L (ref 96–112)
Creatinine, Ser: 0.7 mg/dL (ref 0.40–1.20)
GFR: 85.24 mL/min (ref >60.00)
GLUCOSE: 78 mg/dL (ref 70–99)
Potassium: 4.1 mEq/L (ref 3.5–5.1)
SODIUM: 141 meq/L (ref 135–145)

## 2017-02-09 LAB — LIPID PANEL
CHOLESTEROL: 128 mg/dL (ref 0–200)
HDL: 52.2 mg/dL (ref >39.00)
LDL CALC: 63 mg/dL (ref 0–99)
NonHDL: 75.37
TRIGLYCERIDES: 61 mg/dL (ref 0.0–149.0)
Total CHOL/HDL Ratio: 2
VLDL: 12.2 mg/dL (ref 0.0–40.0)

## 2017-02-09 LAB — HEMOGLOBIN A1C

## 2017-02-09 LAB — TSH: TSH: 1.01 u[IU]/mL (ref 0.35–4.50)

## 2017-02-09 NOTE — Progress Notes (Signed)
   Subjective:    Patient ID: Melanie Mcdonald, female    DOB: 10-22-1932, 83 y.o.   MRN: [id number]  HPI Here to follow up on issues including chronic dizziness. This comes and goes but she uses her walker at all times now and it has been a while now since she has fallen. She still has urinary incontinence and she has been diagnosed with a dropped bladder. She would like to have this repaired if possible. Her depression has been stable. At out last visit she started back on Wellbutrin, but sh stopped this again after 2 weeks because she decided she did not need it. Her diabetes has been under marginal control and she continues to work with Dr. Buddy Duty about this. Her narcolepsy is stable.    Review of Systems  Constitutional: Negative.   HENT: Negative.   Eyes: Negative.   Respiratory: Negative.   Cardiovascular: Negative.   Gastrointestinal: Negative.   Genitourinary: Negative for decreased urine volume, difficulty urinating, dyspareunia, dysuria, enuresis, flank pain, frequency, hematuria, pelvic pain and urgency.  Musculoskeletal: Positive for back pain, gait problem, neck pain and neck stiffness. Negative for arthralgias, joint swelling and myalgias.  Skin: Negative.   Neurological: Positive for dizziness. Negative for tremors, seizures, syncope, facial asymmetry, speech difficulty, weakness, light-headedness, numbness and headaches.  Psychiatric/Behavioral: Negative.        Objective:   Physical Exam  Constitutional: She is oriented to person, place, and time. She appears well-developed and well-nourished. No distress.  HENT:  Head: Normocephalic and atraumatic.  Right Ear: External ear normal.  Left Ear: External ear normal.  Nose: Nose normal.  Mouth/Throat: Oropharynx is clear and moist. No oropharyngeal exudate.  Eyes: Conjunctivae and EOM are normal. Pupils are equal, round, and reactive to light. No scleral icterus.  Neck: Normal range of motion. Neck supple. No JVD present.  No thyromegaly present.  Cardiovascular: Normal rate, regular rhythm, normal heart sounds and intact distal pulses.  Exam reveals no gallop and no friction rub.   No murmur heard. Pulmonary/Chest: Effort normal and breath sounds normal. No respiratory distress. She has no wheezes. She has no rales. She exhibits no tenderness.  Abdominal: Soft. Bowel sounds are normal. She exhibits no distension and no mass. There is no tenderness. There is no rebound and no guarding.  Musculoskeletal: Normal range of motion. She exhibits no edema or tenderness.  Lymphadenopathy:    She has no cervical adenopathy.  Neurological: She is alert and oriented to person, place, and time. No cranial nerve deficit. She exhibits normal muscle tone. Coordination normal.  Skin: Skin is warm and dry. No rash noted. No erythema.  Psychiatric: She has a normal mood and affect. Her behavior is normal. Judgment and thought content normal.          Assessment & Plan:  Her HTN is stable. Her depression is stable off medications. Her narcolepsy is managed with Dexedrine. We will get fasting labs today to check her lipids, among other things. She will follow up with Dr. Buddy Duty for the diabetes. Refer to Urology for the dropped bladder.  Alysia Penna, MD

## 2017-02-09 NOTE — Telephone Encounter (Signed)
This patient in this am. Feels like she is taking her medicine as she was hypoglycemic last pm.  A1c elevated. Continue to follow in office. Was referred back to endo per the note.

## 2017-02-09 NOTE — Patient Instructions (Signed)
WE NOW OFFER   [facility] FAST TRACK!!!  SAME DAY Appointments for ACUTE CARE  Such as: Sprains, Injuries, cuts, abrasions, rashes, muscle pain, joint pain, back pain Colds, flu, sore throats, headache, allergies, cough, fever  Ear pain, sinus and eye infections Abdominal pain, nausea, vomiting, diarrhea, upset stomach Animal/insect bites  3 Easy Ways to Schedule: Walk-In Scheduling Call in scheduling Mychart Sign-up: [email or web address]         

## 2017-02-13 NOTE — Telephone Encounter (Signed)
Appeal filed.  Original request was denied because paperwork was not sent back in time - due to myself being out of office 5/10 & 5/11.

## 2017-02-22 NOTE — Telephone Encounter (Signed)
Appeal approved, form faxed to pharmacy.

## 2017-03-15 ENCOUNTER — Telehealth: Payer: Self-pay | Admitting: Family Medicine

## 2017-03-15 NOTE — Telephone Encounter (Signed)
Pt son Melanie Mcdonald would like to have form completed to be excuse from jury duty.on Monday July 2 ,18

## 2017-03-16 NOTE — Telephone Encounter (Signed)
Pt son is aware

## 2017-03-16 NOTE — Telephone Encounter (Signed)
Per Dr. Sarajane Jews, he cannot fill this out and pt is aware.

## 2017-03-30 ENCOUNTER — Other Ambulatory Visit: Payer: Self-pay | Admitting: Internal Medicine

## 2017-03-30 ENCOUNTER — Ambulatory Visit
Admission: RE | Admit: 2017-03-30 | Discharge: 2017-03-30 | Disposition: A | Discharge status: Home / Self Care | Payer: Medicare Other | Source: Ambulatory Visit | Attending: Internal Medicine | Admitting: Internal Medicine

## 2017-03-30 DIAGNOSIS — R519 Headache, unspecified: Secondary | ICD-10-CM

## 2017-03-30 DIAGNOSIS — R42 Dizziness and giddiness: Secondary | ICD-10-CM

## 2017-03-30 DIAGNOSIS — R110 Nausea: Secondary | ICD-10-CM

## 2017-03-30 DIAGNOSIS — R51 Headache: Principal | ICD-10-CM

## 2017-03-30 LAB — POCT ERYTHROCYTE SEDIMENTATION RATE, NON-AUTOMATED: Sed Rate: 29

## 2017-03-30 LAB — HEPATIC FUNCTION PANEL
ALT: 13 (ref 7–35)
AST: 16 (ref 13–35)
Alkaline Phosphatase: 71 (ref 25–125)
BILIRUBIN, TOTAL: 0.6

## 2017-03-30 LAB — HEMOGLOBIN A1C: HEMOGLOBIN A1C: 10

## 2017-03-30 LAB — CBC AND DIFFERENTIAL
HCT: 42 (ref 36–46)
Hemoglobin: 14 (ref 12.0–16.0)
Neutrophils Absolute: 5
PLATELETS: 215 (ref 150–399)
WBC: 9.2

## 2017-03-30 LAB — BASIC METABOLIC PANEL
BUN: 18 (ref 4–21)
Creatinine: 0.8 (ref 0.5–1.1)
GLUCOSE: 124
Potassium: 3.8 (ref 3.4–5.3)
SODIUM: 140 (ref 137–147)

## 2017-03-30 LAB — TSH: TSH: 0.94 (ref 0.41–5.90)

## 2017-03-30 LAB — MICROALBUMIN, URINE: MICROALB UR: 1.13

## 2017-04-02 ENCOUNTER — Encounter: Payer: Self-pay | Admitting: Family Medicine

## 2017-04-02 ENCOUNTER — Ambulatory Visit (INDEPENDENT_AMBULATORY_CARE_PROVIDER_SITE_OTHER): Payer: Medicare Other | Admitting: Family Medicine

## 2017-04-02 VITALS — BP 126/70 | Temp 98.2°F | Ht 69.0 in | Wt 234.0 lb

## 2017-04-02 DIAGNOSIS — G458 Other transient cerebral ischemic attacks and related syndromes: Secondary | ICD-10-CM

## 2017-04-02 DIAGNOSIS — H538 Other visual disturbances: Secondary | ICD-10-CM

## 2017-04-02 DIAGNOSIS — R2681 Unsteadiness on feet: Secondary | ICD-10-CM

## 2017-04-02 DIAGNOSIS — R531 Weakness: Secondary | ICD-10-CM | POA: Diagnosis not present

## 2017-04-02 MED ORDER — OXYCODONE-ACETAMINOPHEN 10-325 MG PO TABS: 1.0000 | ORAL_TABLET | Freq: Four times a day (QID) | ORAL | 0 refills | Status: DC | PRN

## 2017-04-02 MED ORDER — HYDROCODONE-HOMATROPINE 5-1.5 MG/5ML PO SYRP: 5.0000 mL | ORAL_SOLUTION | ORAL | 0 refills | Status: DC | PRN

## 2017-04-02 NOTE — Progress Notes (Signed)
   Subjective:    Patient ID: Melanie Mcdonald, female    DOB: 01-10-1933, 83 y.o.   MRN: [id number]  HPI Here for several weeks of intermittent frontal headaches, dizziness, and blurred vision. She saw Dr. Buddy Duty recently and described these symptoms to him. He sent her for a head CT which showed an old lacunar infarct but nothing acute. Her HTN has been well controlled. She denies any loss of field of vision and no unusual light sensitivity.    Review of Systems  Constitutional: Negative.   Eyes: Positive for visual disturbance. Negative for photophobia, pain and redness.  Respiratory: Negative.   Cardiovascular: Negative.   Neurological: Positive for dizziness and headaches. Negative for tremors, seizures, syncope, facial asymmetry, speech difficulty, weakness, light-headedness and numbness.       Objective:   Physical Exam  Constitutional: She appears well-developed and well-nourished. No distress.  HENT:  Head: Normocephalic and atraumatic.  Eyes: Conjunctivae and EOM are normal. Pupils are equal, round, and reactive to light.  Neck: Neck supple. No thyromegaly present.  Cardiovascular: Normal rate, regular rhythm, normal heart sounds and intact distal pulses.   Pulmonary/Chest: Breath sounds normal. She is in respiratory distress. She has no wheezes. She has no rales. She exhibits no tenderness.  Lymphadenopathy:    She has no cervical adenopathy.  Neurological: She is alert. No cranial nerve deficit. She exhibits normal muscle tone. Coordination normal.          Assessment & Plan:  Here for headaches and blurred vision. There is nothing on her neurologic exam or recent head CT scan to explain these symptoms. No evidence of temporal arteritis. She has an appointment later this week with a new ophthalmologist, and I urged her to get a full eye exam to rule out glaucoma, etc.  Alysia Penna, MD

## 2017-04-02 NOTE — Patient Instructions (Signed)
WE NOW OFFER   [facility] FAST TRACK!!!  SAME DAY Appointments for ACUTE CARE  Such as: Sprains, Injuries, cuts, abrasions, rashes, muscle pain, joint pain, back pain Colds, flu, sore throats, headache, allergies, cough, fever  Ear pain, sinus and eye infections Abdominal pain, nausea, vomiting, diarrhea, upset stomach Animal/insect bites  3 Easy Ways to Schedule: Walk-In Scheduling Call in scheduling Mychart Sign-up: [email or web address]         

## 2017-04-05 ENCOUNTER — Encounter: Payer: Self-pay | Admitting: Family Medicine

## 2017-04-13 ENCOUNTER — Ambulatory Visit
Admission: RE | Admit: 2017-04-13 | Discharge: 2017-04-13 | Disposition: A | Discharge status: Home / Self Care | Payer: Medicare Other | Source: Ambulatory Visit | Attending: Internal Medicine | Admitting: Internal Medicine

## 2017-04-13 ENCOUNTER — Other Ambulatory Visit: Payer: Self-pay | Admitting: Internal Medicine

## 2017-04-13 DIAGNOSIS — E1169 Type 2 diabetes mellitus with other specified complication: Secondary | ICD-10-CM

## 2017-04-13 DIAGNOSIS — M109 Gout, unspecified: Secondary | ICD-10-CM

## 2017-04-13 DIAGNOSIS — E1165 Type 2 diabetes mellitus with hyperglycemia: Secondary | ICD-10-CM

## 2017-04-13 DIAGNOSIS — M79672 Pain in left foot: Secondary | ICD-10-CM

## 2017-04-16 ENCOUNTER — Encounter: Payer: Self-pay | Admitting: Podiatry

## 2017-04-16 ENCOUNTER — Ambulatory Visit: Payer: Medicare Other | Admitting: Family Medicine

## 2017-04-16 ENCOUNTER — Ambulatory Visit (INDEPENDENT_AMBULATORY_CARE_PROVIDER_SITE_OTHER): Payer: Medicare Other | Admitting: Podiatry

## 2017-04-16 DIAGNOSIS — E1149 Type 2 diabetes mellitus with other diabetic neurological complication: Secondary | ICD-10-CM | POA: Diagnosis not present

## 2017-04-16 DIAGNOSIS — L02619 Cutaneous abscess of unspecified foot: Secondary | ICD-10-CM

## 2017-04-16 DIAGNOSIS — E1140 Type 2 diabetes mellitus with diabetic neuropathy, unspecified: Secondary | ICD-10-CM | POA: Diagnosis not present

## 2017-04-16 DIAGNOSIS — L03119 Cellulitis of unspecified part of limb: Secondary | ICD-10-CM | POA: Diagnosis not present

## 2017-04-16 DIAGNOSIS — L97521 Non-pressure chronic ulcer of other part of left foot limited to breakdown of skin: Secondary | ICD-10-CM

## 2017-04-16 LAB — CBC WITH DIFFERENTIAL/PLATELET
BASOS ABS: 0 {cells}/uL (ref 0–200)
BASOS PCT: 0 %
EOS ABS: 108 {cells}/uL (ref 15–500)
EOS PCT: 1 %
HCT: 39.9 % (ref 35.0–45.0)
HEMOGLOBIN: 12.9 g/dL (ref 11.7–15.5)
LYMPHS ABS: 1944 {cells}/uL (ref 850–3900)
Lymphocytes Relative: 18 %
MCH: 28.9 pg (ref 27.0–33.0)
MCHC: 32.3 g/dL (ref 32.0–36.0)
MCV: 89.5 fL (ref 80.0–100.0)
MPV: 10.8 fL (ref 7.5–12.5)
Monocytes Absolute: 648 cells/uL (ref 200–950)
Monocytes Relative: 6 %
NEUTROS ABS: 8100 {cells}/uL — AB (ref 1500–7800)
Neutrophils Relative %: 75 %
Platelets: 237 10*3/uL (ref 140–400)
RBC: 4.46 MIL/uL (ref 3.80–5.10)
RDW: 14 % (ref 11.0–15.0)
WBC: 10.8 10*3/uL (ref 3.8–10.8)

## 2017-04-16 MED ORDER — CEPHALEXIN 500 MG PO CAPS: 500.0000 mg | ORAL_CAPSULE | Freq: Three times a day (TID) | ORAL | 0 refills | Status: DC

## 2017-04-16 NOTE — Progress Notes (Signed)
Subjective:    Patient ID: Melanie Mcdonald, female    DOB: [birth year], 83 y.o.   MRN: [id number]  HPI  Chief Complaint  Patient presents with  . Foot Pain    Lt heel has bump on inside of foot, very tender and swollen X 1 week   Melanie Mcdonald Presents the office today for concerns of pain, possible infection to left heel. She has noticed a blister which started about 1 week ago. This may have started after she stepped on something metal but she denies any metal being pushed into her skin. She states that she has noticed a blister as well as redness around the area. She is currently not taking any antibiotics.she is diabetic and her A1c was 10. She denies any systemic complaints such as fevers, chills, nausea, vomiting. Denies any calf pain, chest pain, shortness of breath. She has no other concerns today.    Review of Systems  Constitutional: Positive for activity change and chills.  HENT: Positive for ear pain.   Eyes: Positive for itching and visual disturbance.  Respiratory: Positive for shortness of breath.   Cardiovascular: Positive for leg swelling.  Gastrointestinal: Positive for constipation and nausea.  Genitourinary: Positive for urgency.  Musculoskeletal: Positive for arthralgias, back pain, gait problem and myalgias.  Neurological: Positive for dizziness, weakness, light-headedness and headaches.  Hematological:       Swollen lymph  All other systems reviewed and are negative.      Objective:   Physical Exam General: AAO x3, NAD  Dermatological: On the posterior lateral aspect of the left heel is a blister with surrounding erythema to the lateral heel. The blister does extend slightly to the plantar heel. The erythema extends approximately 1 cm. There is no ascending cellulitis. Upon drainage of the blister there was purulence expressed. Upon further debridement of the area I did debride the blister material to reveal underlying superficial granular wound but there is no  deep wound present. There is no area of fluctuance or crepitus otherwise. No other open lesions are identified.   Vascular: Dorsalis Pedis artery and Posterior Tibial artery pedal pulses are palpable bilateral with immedate capillary fill time. There is no pain with calf compression, swelling, warmth, erythema.   Neruologic: Sensation decreased with SWMF.   Musculoskeletal: Tenderness palpation along the blister area to lateral heel. No other areas of tenderness identified. Muscular strength 5/5 in all groups tested bilateral  CBC Latest Ref Rng & Units 03/30/2017 02/09/2017 10/09/2016  WBC - 9.2 8.1 10.3  Hemoglobin 12.0 - 16.0 14.0 12.7 12.3  Hematocrit 36 - 46 42 38.1 37.4  Platelets 150 - 399 215 206.0 214       Assessment & Plan:  83 year old female with superficial abscess and localized cellulitis left heel -Treatment options discussed including all alternatives, risks, and complications -Etiology of symptoms were discussed -Chart and previous x-rays are reviewed. -I drain the blister today to reveal purulent material which was cultured. I further debrided the area to further evaluate the underlying skin. There is a superficial wound present there is no deep wound present and I do not feel there is a deep abscess. -Keflex prescribed -Continue Silvadene dressing changes daily which was prescribed today this was also applied. -Offloading.  -If there is not evidence of healing next week we'll order an MRI. -Monitor for any clinical signs or symptoms of infection and directed to call the office immediately should any occur or go to the ER. -RTC 1 week or  sooner if needed.  Celesta Gentile, DPM

## 2017-04-17 ENCOUNTER — Ambulatory Visit (INDEPENDENT_AMBULATORY_CARE_PROVIDER_SITE_OTHER): Payer: Medicare Other | Admitting: Podiatry

## 2017-04-17 ENCOUNTER — Telehealth: Payer: Self-pay | Admitting: *Deleted

## 2017-04-17 ENCOUNTER — Telehealth: Payer: Self-pay | Admitting: Podiatry

## 2017-04-17 VITALS — BP 168/85 | HR 92 | Temp 98.0°F

## 2017-04-17 DIAGNOSIS — E1149 Type 2 diabetes mellitus with other diabetic neurological complication: Secondary | ICD-10-CM

## 2017-04-17 DIAGNOSIS — L02619 Cutaneous abscess of unspecified foot: Secondary | ICD-10-CM

## 2017-04-17 DIAGNOSIS — E1140 Type 2 diabetes mellitus with diabetic neuropathy, unspecified: Secondary | ICD-10-CM | POA: Diagnosis not present

## 2017-04-17 DIAGNOSIS — L03119 Cellulitis of unspecified part of limb: Principal | ICD-10-CM

## 2017-04-17 LAB — SEDIMENTATION RATE: SED RATE: 32 mm/h — AB (ref 0–30)

## 2017-04-17 LAB — C-REACTIVE PROTEIN: CRP: 43.4 mg/L — ABNORMAL HIGH (ref <8.0)

## 2017-04-17 NOTE — Telephone Encounter (Signed)
I saw Dr. Jacqualyn Posey yesterday and he did a little procedure yesterday and I'm having complications and need to see him today. I will not be home after 9 am as I have another appointment. If someone could please call me back as soon as possible.

## 2017-04-17 NOTE — Patient Instructions (Signed)
ANTIBACTERIAL SOAP INSTRUCTIONS  THE DAY AFTER PROCEDURE  Please follow the instructions your doctor has marked.     Place 3-4 drops of antibacterial liquid soap or Epsom salts in a quart of warm tap water.  Submerge foot into water for 2-3 minutes.    Marland Kitchen  Apply triple antibiotic ointment and gauze dressing, secure with ace wrap  Continue antibiotics by mouth 1 capsule 3 times a day as prescribed on the visit of 04/16/2017  If you develop any sudden increase in pain, swelling, redness, fever present to the emergency department

## 2017-04-17 NOTE — Telephone Encounter (Addendum)
-----   Message from Trula Slade, DPM sent at 04/17/2017 11:48 AM EDT ----- Sed rate and CRP are elevated consistent with infection, WBC normal- Please let her know. Continue antibiotics. If any worsening she is to to the ER. I informed pt of Dr. Leigh Aurora review of labs and orders. Pt states understanding and says leg weeps so much, it goes through all the gauze, towels and puppy pad when she sleeps. Pt states Dr. Sharlene Motts gives her a powder for her legs. I told pt I would inform Dr. Jacqualyn Posey and call again.04/19/2017-Left message informing pt Dr. Jacqualyn Posey wanted to know the name of the antibiotic powder Dr. Sharlene Motts prescribed. Pt seen in office today, Dr. Jacqualyn Posey ordered MRI left foot with contrast r/o abscess. Orders given to D. Meadows, and faxed to [facility].04/20/2017-DrJacqualyn Posey reviewed the wound cultures and the cephalexin would treat the bacteria in the wound, he ordered the [place] powder for the weeping legs one packet to affected area 3 x daily, andto continue to elevate the feet and keep wrapped with the ace wraps. I gave pt Dr. Leigh Aurora orders and informed that if D. Billy Coast was able to get the MRI pre-cert, it may be able to be scheduled earlier. Pt states understanding and thank me and D. Meadows for our help. I scheduled pt with [facility]. For 04/24/2017 arrival 11:45am for 12:15pm testing. Pt informed of appt and [facility] address and contact information. Cancelled [facility] 05/03/2017 appt. Faxed orders to [facility].

## 2017-04-17 NOTE — Progress Notes (Signed)
Patient ID: Melanie Mcdonald, female   DOB: Jan 05, 1933, 83 y.o.   MRN: [id number]   Subjective: This patient presents today for follow-up visit of 04/16/2017. At that visit patient was diagnosed with cellulitis in the left heel and cephalexin 500 mg by mouth 3 times a day was prescribed. Addition a gauze dressing was applied. Patient presents today stating that she's noticed some watery-like drainage in the bandage in the bandage felt uncomfortable as became too tight. Patient has a previous history of drainage from watery lesions on the ankle in the past Patient is a known diabetic with neuropathy  Objective Orientated 3 BP 116/85 Pulse 92 Temperature 98 F  Patient walks slowly with cane Symmetrical peripheral edema bilaterally Watery blisters left lower extremity No calf tenderness bilaterally Blister reaction medial left heel 20 mm x 15 mm surrounded by local erythema There is no erythema beyond the local blister reaction medial left heel There is no warmth noted in the heel or left lower leg  Assessment: Peripheral edema bilaterally Vesicular eruptions left lower leg associated with edema Cellulitis localized left heel  Plan: Remove existing dressing Apply triple antibiotic ointment to left lower leg and blister area medial heel Apply Kerlix dressing and secured with Ace wrap Dispensed surgical shoe Continue cephalexin by mouth 3 times a day advised prescribed Patient instructed to observe left foot/leg and notice any sudden increased pain, swelling, redness, fever present to ED Patient has scheduled visit reappoint 2 days with Dr. Earleen Newport

## 2017-04-18 NOTE — Telephone Encounter (Signed)
Can you ask her what the power is? How is she feeling otherwise? Is the redness and pain improving?

## 2017-04-19 ENCOUNTER — Ambulatory Visit (INDEPENDENT_AMBULATORY_CARE_PROVIDER_SITE_OTHER): Payer: Medicare Other | Admitting: Podiatry

## 2017-04-19 ENCOUNTER — Encounter: Payer: Self-pay | Admitting: Podiatry

## 2017-04-19 DIAGNOSIS — L02619 Cutaneous abscess of unspecified foot: Secondary | ICD-10-CM

## 2017-04-19 DIAGNOSIS — I872 Venous insufficiency (chronic) (peripheral): Secondary | ICD-10-CM

## 2017-04-19 DIAGNOSIS — L97521 Non-pressure chronic ulcer of other part of left foot limited to breakdown of skin: Secondary | ICD-10-CM

## 2017-04-19 DIAGNOSIS — L03119 Cellulitis of unspecified part of limb: Secondary | ICD-10-CM

## 2017-04-20 LAB — CULTURE, ROUTINE-ABSCESS: Gram Stain: NONE SEEN

## 2017-04-20 MED ORDER — ALUM SULFATE-CA ACETATE EX PACK: 1.0000 | PACK | Freq: Three times a day (TID) | CUTANEOUS | 12 refills | Status: DC

## 2017-04-20 NOTE — Telephone Encounter (Signed)
I just got a call from [facility] and the earliest appointment I can get is over 2 weeks away. I was wondering if I could go somewhere else, because I'm afraid the infection could get much much worse before then or cause my foot to fall off. I'd like to be seen somewhere sooner than 2 weeks out. Please cal me back at [phone] because I am worried about this.

## 2017-04-20 NOTE — Telephone Encounter (Signed)
Yes Dr. Jacqualyn Posey told me yesterday he was going to call me in another antibiotic to take with the cephalexin he prescribed for me on 16 April 2017. My pharmacy is [facility] [phone].

## 2017-04-24 ENCOUNTER — Ambulatory Visit: Payer: Medicare Other | Admitting: Podiatry

## 2017-04-26 ENCOUNTER — Encounter: Payer: Self-pay | Admitting: Podiatry

## 2017-04-26 ENCOUNTER — Ambulatory Visit (INDEPENDENT_AMBULATORY_CARE_PROVIDER_SITE_OTHER): Payer: Medicare Other | Admitting: Podiatry

## 2017-04-26 DIAGNOSIS — L97409 Non-pressure chronic ulcer of unspecified heel and midfoot with unspecified severity: Secondary | ICD-10-CM

## 2017-04-26 DIAGNOSIS — E1140 Type 2 diabetes mellitus with diabetic neuropathy, unspecified: Secondary | ICD-10-CM | POA: Diagnosis not present

## 2017-04-26 DIAGNOSIS — L97521 Non-pressure chronic ulcer of other part of left foot limited to breakdown of skin: Secondary | ICD-10-CM

## 2017-04-26 DIAGNOSIS — E1149 Type 2 diabetes mellitus with other diabetic neurological complication: Secondary | ICD-10-CM

## 2017-04-26 DIAGNOSIS — I872 Venous insufficiency (chronic) (peripheral): Secondary | ICD-10-CM

## 2017-04-26 NOTE — Progress Notes (Signed)
Subjective: Melanie Mcdonald presents to the office today for follow up evaluation of a blister, superficial abscess to the medial aspect the left heel. She states that the area is still painful. She presents today however as her left leg is been draining clear fluid. She states this is not new for her she's had this issue before. She does not take the Lasix like she is prescribed. She denies any drainage or pus coming from the area and the left heel. She denies any increase in redness or warmth. She is continue with antibiotics. Denies any systemic complaints such as fevers, chills, nausea, vomiting. No acute changes since last appointment, and no other complaints at this time.   Objective: AAO x3, NAD DP/PT pulses palpable bilaterally, CRT less than 3 seconds Superficial wound present medial aspect left heel with granular base. There is faint surrounding erythema and edema and this appears to be improved compared to what it was when I last saw her on Monday. There is no ascending cellulitis. There is no fluctuance or crepitus and there is no malodor. There does appear to be quite a bit of clear drainage coming from her left leg. There is no pain with calf compression, erythema or warmth. There is no open sores identified the leg. No open lesions or pre-ulcerative lesions.  No pain with calf compression, swelling, warmth, erythema  Assessment: Left leg swelling, venous insufficiency with resolving infection left heel  Plan: -All treatment options discussed with the patient including all alternatives, risks, complications.  -At this point recommend continue daily dressing changes for the area of the left foot. I do also recommended compression of the left leg and this led was wrapped today separately from the wounds to help prevent the fluid getting into the wound. Also this point I recommend an MRI of the left heel which is ordered today. Continue antibiotics. -Recommend her take her Lasix as prescribed by  her primary care physician. -Discussed with is any worsening symptoms she is to go immediately to the emergency room she understood this. -RTC 1 week or sooner if needed. -Patient encouraged to call the office with any questions, concerns, change in symptoms.   Celesta Gentile, DPM

## 2017-04-27 NOTE — Progress Notes (Signed)
Subjective: Ms. Harbor presents the office today for follow-up evaluation of left leg swelling, drainage as well as posterior/wound to left heel. She states the air in the heel still tender but she denies any drainage or pus from. She states the redness is improved. Since using the powdered her legs that drainage is also much improved her legs and she is not having the clear drainage she was experiencing. She has continued with antibiotics, Keflex. She also presents today to discuss MRI results. She also expresses concern for possible circulation issues due to an old injury to the left leg. Denies any systemic complaints such as fevers, chills, nausea, vomiting. No acute changes since last appointment, and no other complaints at this time.   Objective: AAO x3, NAD DP/PT pulses palpable bilaterally, CRT less than 3 seconds Along the medial aspect left heel is an area of what appears to be a blister/callus skin. Upon debridement there is macerated tissue underneath the area but there is no drainage or pus expressed. I did debride more hyperkeratotic tissue which extends the plantar aspect of the heel and there was a small opening in the heel. I was able to probe somewhat underneath the area of skin and extends the medial heel only bloody drainage is expressible is no purulence and there is no evidence of an abscess. The erythema to the medial aspect of the foot appears to be much improved compared to what it was last week when I first saw her. There is no fluctuance, crepitus, malodor. There does continue to be tenderness directly along this area "blister" to the medial heel. No other area of tenderness. There is no drainage in the leg today and there is no other open sores.  No open lesions or pre-ulcerative lesions.  No pain with calf compression, swelling, warmth, erythema  Assessment: Wound left medial heel   Plan: -All treatment options discussed with the patient including all alternatives, risks,  complications.  -I reviewed the MRI with the patient. This was done at [facility]. "Evidence of cellulitis and phegmonous changes in the lateral ankle. No evidence of abscess, septic tenosynoviits, joint space infection, or osteomyelitis. Posteromedial skin blister and subjacent cellulitis in the medial heel pad. Synovitis of the tarsal tunnel consistent with tarsal tunnel syndrome and with small adjacent osteochondral defect of the calcaneus."  -ABI was done in the office today give wound. ABI on the left is 1.13 and right 1.16 with normal waveforms at the ankle.  -Continue Keflex. -Recommend surgical shoe. She is not wearing this. -Sharply debrided the hyperkeratotic tissue today and there was no underlying drainage or pus expressed. Only able to identify some bloody drainage. No evidence of abscess like identified today. -Monitor for any clinical signs or symptoms of infection and directed to call the office immediately should any occur or go to the ER. -RTC 1 week or sooner if needed.  -Patient encouraged to call the office with any questions, concerns, change in symptoms.   Celesta Gentile, DPM

## 2017-05-03 ENCOUNTER — Ambulatory Visit (INDEPENDENT_AMBULATORY_CARE_PROVIDER_SITE_OTHER): Payer: Medicare Other | Admitting: Podiatry

## 2017-05-03 ENCOUNTER — Other Ambulatory Visit: Payer: Medicare Other

## 2017-05-03 ENCOUNTER — Ambulatory Visit: Payer: Medicare Other | Admitting: Podiatry

## 2017-05-03 ENCOUNTER — Encounter: Payer: Self-pay | Admitting: Podiatry

## 2017-05-03 DIAGNOSIS — E1140 Type 2 diabetes mellitus with diabetic neuropathy, unspecified: Secondary | ICD-10-CM | POA: Diagnosis not present

## 2017-05-03 DIAGNOSIS — E1149 Type 2 diabetes mellitus with other diabetic neurological complication: Secondary | ICD-10-CM | POA: Diagnosis not present

## 2017-05-03 DIAGNOSIS — L97409 Non-pressure chronic ulcer of unspecified heel and midfoot with unspecified severity: Secondary | ICD-10-CM | POA: Diagnosis not present

## 2017-05-11 ENCOUNTER — Encounter: Payer: Self-pay | Admitting: Podiatry

## 2017-05-11 ENCOUNTER — Ambulatory Visit (INDEPENDENT_AMBULATORY_CARE_PROVIDER_SITE_OTHER): Payer: Medicare Other

## 2017-05-11 ENCOUNTER — Ambulatory Visit (INDEPENDENT_AMBULATORY_CARE_PROVIDER_SITE_OTHER): Payer: Medicare Other | Admitting: Podiatry

## 2017-05-11 VITALS — BP 160/87 | HR 97 | Temp 98.8°F | Resp 18

## 2017-05-11 DIAGNOSIS — L97409 Non-pressure chronic ulcer of unspecified heel and midfoot with unspecified severity: Secondary | ICD-10-CM

## 2017-05-11 DIAGNOSIS — S80852A Superficial foreign body, left lower leg, initial encounter: Secondary | ICD-10-CM

## 2017-05-11 NOTE — Progress Notes (Signed)
Subjective: Ms. Outen presents the office today for follow-up evaluation of left leg swelling, drainage as well as posterior/wound to left heel. She states the areas to not better. The tenderness has improved. She denies any drainage or pus and she denies any red streaks. She is continuing to the dressing changes. Denies any systemic complaints such as fevers, chills, nausea, vomiting. No acute changes since last appointment, and no other complaints at this time.   Objective: AAO x3, NAD DP/PT pulses palpable bilaterally, CRT less than 3 seconds Along the medial aspect left heel is an area of hyperkeratotic tissue. After debridement the wound appears almost completely healed at this point. There is minimal tenderness this area and there is no surrounding erythema, ascending synovitis. Unable to identify any areas of fluctuance or crepitus today. There is no malodor. There is no other open lesions or pre-ulcerative lesions identified. No pain with calf compression, swelling, warmth, erythema      Assessment: Wound left medial heel, healing  Plan: -All treatment options discussed with the patient including all alternatives, risks, complications.  -Sharply debrided hyperkeratotic tissue time to healthy tissue. This was completed to the any complications or bleeding. Very small superficial wound remains. I recommended Betadine to this area daily. -Finish course of antibiotics. -Continue surgical shoe to completely healed. -Monitor for any clinical signs or symptoms of infection and directed to call the office immediately should any occur or go to the ER. -She is doing well and is very thankful today.   Celesta Gentile, DPM

## 2017-05-14 ENCOUNTER — Ambulatory Visit
Admission: RE | Admit: 2017-05-14 | Discharge: 2017-05-14 | Disposition: A | Discharge status: Home / Self Care | Payer: Medicare Other | Source: Ambulatory Visit | Attending: Podiatry | Admitting: Podiatry

## 2017-05-14 ENCOUNTER — Ambulatory Visit (INDEPENDENT_AMBULATORY_CARE_PROVIDER_SITE_OTHER): Payer: Medicare Other | Admitting: Podiatry

## 2017-05-14 ENCOUNTER — Encounter: Payer: Self-pay | Admitting: Podiatry

## 2017-05-14 VITALS — BP 143/74 | HR 97

## 2017-05-14 DIAGNOSIS — S80852A Superficial foreign body, left lower leg, initial encounter: Secondary | ICD-10-CM | POA: Diagnosis not present

## 2017-05-14 DIAGNOSIS — L97409 Non-pressure chronic ulcer of unspecified heel and midfoot with unspecified severity: Secondary | ICD-10-CM

## 2017-05-14 NOTE — Progress Notes (Addendum)
Subjective: Ms. Fickel presents the office today for follow-up evaluation of left heel wound. Should there is still tender at times but is getting better. She also has a concern today that she had a stick puncture the left leg. She states that was under the calcar to try to pull it out and it penetrated her leg but she didn't get all out she states is not going very deep. Since that she's had some clear drainage this area. Denies any pus. Denies any increase in redness or swelling to her legs. Denies any systemic complaints such as fevers, chills, nausea, vomiting. No acute changes since last appointment, and no other complaints at this time.   Objective: AAO x3, NAD DP/PT pulses palpable bilaterally, CRT less than 3 seconds Along the medial aspect left heel is an area of hyperkeratotic tissue. After debridement the areas and was completely healed except for the more plantar portion of the calluses a small opening. There is no probing, undermining or tunneling. There is no surrounding erythema, ascending cellulitis. No fluctuance, crepitus, malodor. On the medial aspect of the leg just above the ankle is an area of the puncture site. Small amount of clear drainage is expressed there is no pus. There is no swelling erythema, ascending cellulitis. This no fluctuance or crepitus. There is no malodor. No other open lesions or pre-ulcer lesions identified today. No pain with calf compression, swelling, warmth, erythema  Assessment: Wound left medial heel, healing; with puncture wound left leg   Plan: -All treatment options discussed with the patient including all alternatives, risks, complications. -X-rays were obtained and reviewed. No evidence of acute fracture. No evidence of acute [place] myalgias. No evidence of deformity. - In regards the puncture wound to left leg and recommended ultrasound to rule out residual foreign body. Small, clear drainage expressed unable to identify any foreign object. Small  amount antibiotic ointment dressing changes daily. -Debrided the hyperkeratotic tissue into the heel to the any complications or bleeding. Continue with daily dressing changes.  -Keflex (she already has this) -Continue surgical shoe to completely healed. -Monitor for any clinical signs or symptoms of infection and directed to call the office immediately should any occur or go to the ER. -RTC 1 week or sooner if needed.  Celesta Gentile, DPM

## 2017-05-15 NOTE — Progress Notes (Signed)
Patient came into the office yesterday at 4:15 pm and Dr Jacqualyn Posey went over the results. Melanie Mcdonald

## 2017-05-16 NOTE — Progress Notes (Signed)
Subjective: Melanie Mcdonald presents the office they for follow-up evaluation of the wound to the left heel as well as to discuss ultrasound results of the left leg from a foreign body injury.She states that she is doing well. She gets some occasional pain to the he but she denies any drainage eel. She still gets some clear drainage the leg. Denies any pus in the leg. The  swelling to her leg is somewhat improved she states. Denies any systemic complaints such as fevers, chills, nausea, vomiting. No acute changes since last appointment, and no other complaints at this time.   Objective: AAO x3, NAD DP/PT pulses palpable bilaterally, CRT less than 3 seconds Along the plantar aspect of the heel is hyperkeratotic tissue. On the most inferior portion does continue to be a small superficial granular open wound however there is no probing, undermining or tunnel There is no surrounding erythema and there is only trace edema. There is no areas of fluctuance or crepitance.  Along the area of the leg of the foreign body injury this appears be almost healed. She states th was a scab on they did the ultrasound today came off today. Small amount of clear drainage is expressed but no pus. There is no fluctuance or crepitus. No malodor. No open lesions or pre-ulcerative lesions.  No pain with calf compression, swelling, warmth, erythema  Assessment: Healing wounds Left leg, foot  Plan: -All treatment options discussed with the patient including all alternatives, risks, complications.  -Ultrasound results were discussed which were negative for foreign object. -Continue the small amount of antibiotic ointment onto the wound leg daily. Continue daily dressing changes to the heel as well. I was able to sharply debride hyperkeratotic tissue to thel today without any complications or bleeding. Continue a surgical shoe for offloading. -Monitor for any clinical signs or symptoms of infection and directed to call the office  immediately should any occur or go to the ER. -Finish course of antibiotics (she states she still has about a month worth but will continue at least until next appointment) -Elevation to the legs. Continue the the power that she has for the drainage.  -Monitor for any clinical signs or symptoms of infection and directed to call the office immediately should any occur or go to the ER. -Patient encouraged to call the office with any questions, concerns, change in symptoms. -RTC 1 week or sooner if needed.  Celesta Gentile, DPM

## 2017-05-17 ENCOUNTER — Ambulatory Visit: Payer: Medicare Other | Admitting: Podiatry

## 2017-05-18 ENCOUNTER — Ambulatory Visit: Payer: Medicare Other | Admitting: Podiatry

## 2017-05-18 LAB — HM DIABETES EYE EXAM

## 2017-05-25 ENCOUNTER — Ambulatory Visit (INDEPENDENT_AMBULATORY_CARE_PROVIDER_SITE_OTHER): Payer: Medicare Other | Admitting: Podiatry

## 2017-05-25 ENCOUNTER — Encounter: Payer: Self-pay | Admitting: Podiatry

## 2017-05-25 VITALS — BP 175/89 | HR 97 | Temp 98.9°F | Resp 18

## 2017-05-25 DIAGNOSIS — L02619 Cutaneous abscess of unspecified foot: Secondary | ICD-10-CM

## 2017-05-25 DIAGNOSIS — L03119 Cellulitis of unspecified part of limb: Secondary | ICD-10-CM

## 2017-05-25 MED ORDER — DELAFLOXACIN MEGLUMINE 450 MG PO TABS: 1.0000 | ORAL_TABLET | Freq: Two times a day (BID) | ORAL | 0 refills | Status: DC

## 2017-05-25 NOTE — Patient Instructions (Signed)

## 2017-05-27 NOTE — Progress Notes (Signed)
Subjective: Ms. Durio presents the office today for concerns of recurrent pain to the left heel. She said looked chills tender to even put weight down to her foot. She has noticed a blister reformed of the heel. She denies doing any foreign objects recently. She denies any recent injury or trauma. She does not check her blood sugar and she is unsure what it is. Her last A1c was 10. Denies any systemic complaints such as fevers, chills, nausea, vomiting. No acute changes since last appointment, and no other complaints at this time.   Objective: AAO x3, NAD DP/PT pulses palpable bilaterally, CRT less than 3 seconds On the plantar medial aspect left heel continues to be a recurrent blister. There is a hyperkeratotic tissue around this area. There is pain surrounding erythema but there is no ascending cellulitis. There is no crepitation. I was able to drain the blister area and upon aspiration I was able to get purulence coming from this area. This appears to be localized abscess however it is recurring. No open lesions or pre-ulcerative lesions. The wound to the leg where they foreign body penetrated is healed. No pain with calf compression, swelling, warmth, erythema  Assessment: 83 year old female with recurrent abscess left heel  Plan: -All treatment options discussed with the patient including all alternatives, risks, complications.  -At today's appointment was able to aspirate purulence coming from the heel. At this point I recommend surgical debridement, incision and drainage of the area. We will plan on doing this next week as an outpatient infection is localized and does not think it warms hospital mission at this point. I discussed with the surgery as well as the postoperative course. Discussed with her that we don't do this there is a chance of amputation or worsening infection and she understands this and agrees to go ahead and proceed with planned procedure next week. However discussed that  the symptoms worsened the meantime she is to go immediately to the emergency room and she understands this. -Will plan on doing a left foot incision and drainage, wound debridement next week  -The incision placement as well as the postoperative course was discussed with the patient. I discussed risks of the surgery which include, but not limited to, infection, bleeding, pain, swelling, need for further surgery, delayed or nonhealing, painful or ugly scar, numbness or sensation changes, over/under correction, recurrence, transfer lesions, further deformity, hardware failure, DVT/PE, loss of toe/foot. Patient understands these risks and wishes to proceed with surgery. The surgical consent was reviewed with the patient all 3 pages were signed. No promises or guarantees were given to the outcome of the procedure. All questions were answered to the best of my ability. Before the surgery the patient was encouraged to call the office if there is any further questions. The surgery will be performed at the [facility] on an outpatient basis. -I am going to try to change her antibiotic to [facility]. Although she does have reported allergy to 4 cartilage she states that she has some mild nausea with Cipro. I sent this Rx to [facility]. If she does not get [place] by tomorrow I recommended her to call the office and we'll change her to a different antibiotic I can send her regular pharmacy. Monitor closely for any side affects and directed to call the office immediately if any are to occur. -Upon her leaving today she states that the foot felt a lot better after I drained the area. She was able to walk better as well.  -  I sent the aspirated purulence as a culture.  -Patient encouraged to call the office with any questions, concerns, change in symptoms.   Celesta Gentile, DPM

## 2017-05-28 ENCOUNTER — Telehealth: Payer: Self-pay | Admitting: Podiatry

## 2017-05-28 NOTE — Telephone Encounter (Signed)
Called pt to let her know Dr. Jacqualyn Posey wanted to follow up with her this Thursday since she states the medication is helping. Asked the pt to be here at 11:45 am. Pt stated she would be here and asked [place] to contact the surgical center to cancel her surgery for this Wednesday.

## 2017-05-28 NOTE — Telephone Encounter (Signed)
Pt called saying that the Melanie Mcdonald is really helping me. It has helped with the redness, swelling, and pain. I was wanting to know since it has helped me so much, do I still need to have the surgery on Wednesday.

## 2017-05-30 LAB — WOUND CULTURE: GRAM STAIN: NONE SEEN

## 2017-05-31 ENCOUNTER — Ambulatory Visit (INDEPENDENT_AMBULATORY_CARE_PROVIDER_SITE_OTHER): Payer: Medicare Other | Admitting: Podiatry

## 2017-05-31 ENCOUNTER — Encounter: Payer: Self-pay | Admitting: Podiatry

## 2017-05-31 DIAGNOSIS — L97521 Non-pressure chronic ulcer of other part of left foot limited to breakdown of skin: Secondary | ICD-10-CM

## 2017-06-01 ENCOUNTER — Ambulatory Visit: Payer: Medicare Other

## 2017-06-06 ENCOUNTER — Telehealth: Payer: Self-pay | Admitting: *Deleted

## 2017-06-06 ENCOUNTER — Telehealth: Payer: Self-pay | Admitting: Podiatry

## 2017-06-06 NOTE — Telephone Encounter (Signed)
I need the culture result on pt. She was scheduled to have surgery last Wednesday but changed her mind. I need that faxed to me today as I found something and need to compare it to something else. You can call me at [phone] or at [phone]. The fax number is [phone]. Please get this to me as quickly as possible today. Thank you.

## 2017-06-06 NOTE — Telephone Encounter (Signed)
I called Anne Ng with the infection department at [facility] per Dr. Leigh Aurora request. Left message to call me back because Dr. Jacqualyn Posey was wanting to know why she needed the culture results when the patient cancelled her surgery and will not be having surgery. I told her I would go ahead and send the results to her.

## 2017-06-06 NOTE — Telephone Encounter (Signed)
Carn[facility] - [facility] states will fax results to office. Labs given to Dr. Jacqualyn Posey and faxed to [facility].

## 2017-06-06 NOTE — Progress Notes (Signed)
Subjective: Ms. Anagnos presents the office today for follow-up evaluation of infection, wound to left heel. She states that she's been on antibiotics and since doing that she has been doing substantially better. She states that also after a drain the area last appointment she felt that she is improved because that she wanted to cancel the surgery until follow-up evaluation for reevaluation. She states this was rather avoid surgery if possible. She states that she can walk without any pain today. She denies any red streaks or redness or any drainage or pus. Denies any systemic complaints such as fevers, chills, nausea, vomiting. No acute changes since last appointment, and no other complaints at this time.   Objective: AAO x3, NAD DP/PT pulses palpable bilaterally, CRT less than 3 seconds On the medial aspect of the heel is reviewed the hyperkeratotic tissue with evidence of dried blood on the more superior aspect of this. After debridement there is new, pink skin has with central area of macerated tissue. There is no areas of fluctuance or crepitus today. There is no probing, undermining or tunneling. There is no surrounding erythema, ascending cellulitis. There is no malodor. The wound the leg is healed is no other open lesions or pre-ulcerative lesions identified today.  No pain with calf compression, swelling, warmth, erythema   AFTER DEBRIDEMENT:   BEFORE DEBRIDEMENT     Assessment: Wound left heel  Plan: -All treatment options discussed with the patient including all alternatives, risks, complications.  -I Shepherd with hyperkeratotic tissue the wound on the healthy tissue. There is no evidence of any drainage or pus today and the wound appears to be healed. Betadine ointment was applied and recommended Betadine dressing changes daily and only a small amount. -Finish course of antibiotics. -Elevation. -Follow-up on surgery there is recurrence discussed possible surgical debridement  however it does look much improved today. -Monitor for any clinical signs or symptoms of infection and directed to call the office immediately should any occur or go to the ER. -RTC as scheduled or sooner if needed.  -Patient encouraged to call the office with any questions, concerns, change in symptoms.   Celesta Gentile, DPM

## 2017-06-06 NOTE — Telephone Encounter (Signed)
This is Melanie Mcdonald with Infection Control at [facility]).

## 2017-06-06 NOTE — Telephone Encounter (Signed)
Was just calling back to let Kathryne Hitch and Dr. Jacqualyn Posey know there is nothing to worry about concerning the wound culture.

## 2017-06-07 ENCOUNTER — Ambulatory Visit (INDEPENDENT_AMBULATORY_CARE_PROVIDER_SITE_OTHER): Payer: Medicare Other | Admitting: Podiatry

## 2017-06-07 DIAGNOSIS — L97521 Non-pressure chronic ulcer of other part of left foot limited to breakdown of skin: Secondary | ICD-10-CM | POA: Diagnosis not present

## 2017-06-08 ENCOUNTER — Encounter: Payer: Medicare Other | Admitting: Podiatry

## 2017-06-12 NOTE — Progress Notes (Signed)
Subjective: [name] presents the office today for follow-up evaluation of infection, wound to left heel. She finished a course of antibiotics. She states that she is doing well and she has very minimal to no pain to her heel at this time. She denies any swelling or redness or any drainage. She is able to walk without significant pain. Denies any systemic complaints such as fevers, chills, nausea, vomiting. No acute changes since last appointment, and no other complaints at this time.   Objective: AAO x3, NAD DP/PT pulses palpable bilaterally, CRT less than 3 seconds overall the foot appears to motion.. Hyperkeratotic tissue present to the plantar heel nurse one small central annular open sore identified which measures approximately 0.2 x 0.2 x 0.1 cm. This area superficial is no probing, undermining or tunneling. There is no surrounding erythema, ascending cellulitis. There is no fluctuance or crepitus. There is no malodor. No clinical signs of infection are present today.  No other open lesions or pre-ulcerative lesions. There is no pain with calf compression, swelling, warmth, erythema.    .   Wound left heel  Plan: -All treatment options discussed with the patient including all alternatives, risks, complications.  -I was able to sharply debride the hyperkeratotic tissue as well as the wound today to healthy, granular tissue. Wound appears to be healing well and is no signs of infection present today. Recommended a small amount of antibiotic ointment dressing changes daily. Continue with offloading which were dispensed today. -Monitor for any clinical signs or symptoms of infection and directed to call the office immediately should any occur or go to the ER. -Follow-up in 3 weeks or sooner if needed. Call any questions or concerns.   Melanie Mcdonald, DPM

## 2017-06-13 ENCOUNTER — Encounter: Payer: Self-pay | Admitting: Podiatry

## 2017-06-26 ENCOUNTER — Encounter: Payer: Self-pay | Admitting: Podiatry

## 2017-06-26 ENCOUNTER — Telehealth: Payer: Self-pay | Admitting: *Deleted

## 2017-06-26 ENCOUNTER — Ambulatory Visit (INDEPENDENT_AMBULATORY_CARE_PROVIDER_SITE_OTHER): Payer: Medicare Other | Admitting: Podiatry

## 2017-06-26 VITALS — BP 152/78 | HR 87 | Temp 99.5°F | Ht 68.0 in | Wt 242.0 lb

## 2017-06-26 DIAGNOSIS — L02612 Cutaneous abscess of left foot: Secondary | ICD-10-CM

## 2017-06-26 DIAGNOSIS — L97401 Non-pressure chronic ulcer of unspecified heel and midfoot limited to breakdown of skin: Secondary | ICD-10-CM | POA: Diagnosis not present

## 2017-06-26 MED ORDER — CEPHALEXIN 500 MG PO CAPS: 500.0000 mg | ORAL_CAPSULE | Freq: Three times a day (TID) | ORAL | 2 refills | Status: DC

## 2017-06-26 NOTE — Telephone Encounter (Signed)
Patient had a culture today and I gave it to [place] [facility] to send out.Melanie Mcdonald

## 2017-06-27 NOTE — Progress Notes (Signed)
Subjective: Melanie Mcdonald presents today for concerns of a painful lesion to the bottom of her left heel. She states "it got bad again". She is unsure pelvis is been starting but she has noticed that she is having quite a bit of pain to the bottom of left heel. She does not think that she stepped on anything that started this. This has been worsened last couple of days and she felt that the area has opened up.  Denies any systemic complaints such as fevers, chills, nausea, vomiting. No acute changes since last appointment, and no other complaints at this time.   Objective: AAO x3, NAD DP/PT pulses palpable bilaterally, CRT less than 3 seconds To the plantar and plantar medial aspect left heel hyperkeratotic lesion. Over the area of the previous wound that she was having is a scab. Upon debridement I was able to remove the small amount of superficial purulence monitor this area and this was cultured today. There is an underlying wound measuring/1.4 x 0.3 x 0.2 cm. There is no probing to bone, undermining or tunneling. The wound started to the plantar aspect of the heel moving medially. There is no surrounding erythema, ascending cellulitis. There is no flexions or crepitus. There is no malodor. There is a small area of a scratch, superficial wound to the left leg was dried blood from where her casted scratcher. There is no drainage or pus. No open lesions or pre-ulcerative lesions.  No pain with calf compression, swelling, warmth, erythema  Assessment: Superficial wound left leg with left heel superficial abscess, wound  Plan: -All treatment options discussed with the patient including all alternatives, risks, complications.  -Due to both of the wounds prescribed Keflex today she's been taking this previously with no issues. Unfortunate she has multiple allergies. -Debrided a hyperkeratotic lesion to reveal the underlying wound left heel. The wound was debrided to greater tissue. There is small amount of  purulence expressed which was cultured today. Pain was applied followed by dressing. Recommend daily dressing changes with this as well. Continue with offloading as well. -Monitor for any clinical signs or symptoms of infection and directed to call the office immediately should any occur or go to the ER. -RTC 10 days or sooner if needed.  -Patient encouraged to call the office with any questions, concerns, change in symptoms.   Celesta Gentile, DPM

## 2017-06-28 ENCOUNTER — Ambulatory Visit: Payer: Medicare Other | Admitting: Podiatry

## 2017-06-29 LAB — WOUND CULTURE
MICRO NUMBER: 81060714
SPECIMEN QUALITY:: ADEQUATE

## 2017-07-04 ENCOUNTER — Ambulatory Visit (INDEPENDENT_AMBULATORY_CARE_PROVIDER_SITE_OTHER): Payer: Medicare Other | Admitting: Podiatry

## 2017-07-04 DIAGNOSIS — L97421 Non-pressure chronic ulcer of left heel and midfoot limited to breakdown of skin: Secondary | ICD-10-CM | POA: Diagnosis not present

## 2017-07-08 ENCOUNTER — Other Ambulatory Visit: Payer: Self-pay | Admitting: Family Medicine

## 2017-07-10 NOTE — Telephone Encounter (Signed)
Refill for 6 months. 

## 2017-07-12 ENCOUNTER — Ambulatory Visit: Payer: Medicare Other | Admitting: Podiatry

## 2017-07-13 ENCOUNTER — Encounter: Payer: Self-pay | Admitting: Podiatry

## 2017-07-13 ENCOUNTER — Ambulatory Visit (INDEPENDENT_AMBULATORY_CARE_PROVIDER_SITE_OTHER): Payer: Medicare Other | Admitting: Podiatry

## 2017-07-13 ENCOUNTER — Ambulatory Visit (INDEPENDENT_AMBULATORY_CARE_PROVIDER_SITE_OTHER): Payer: Medicare Other | Admitting: Family Medicine

## 2017-07-13 ENCOUNTER — Encounter: Payer: Self-pay | Admitting: Family Medicine

## 2017-07-13 VITALS — BP 178/94 | HR 74 | Temp 98.1°F

## 2017-07-13 DIAGNOSIS — L97421 Non-pressure chronic ulcer of left heel and midfoot limited to breakdown of skin: Secondary | ICD-10-CM | POA: Diagnosis not present

## 2017-07-13 DIAGNOSIS — I10 Essential (primary) hypertension: Secondary | ICD-10-CM

## 2017-07-13 DIAGNOSIS — R0789 Other chest pain: Secondary | ICD-10-CM

## 2017-07-13 DIAGNOSIS — Z23 Encounter for immunization: Secondary | ICD-10-CM

## 2017-07-13 MED ORDER — LOSARTAN POTASSIUM 50 MG PO TABS: 50.0000 mg | ORAL_TABLET | Freq: Every day | ORAL | 3 refills | Status: DC

## 2017-07-13 MED ORDER — OXYCODONE-ACETAMINOPHEN 10-325 MG PO TABS: 1.0000 | ORAL_TABLET | Freq: Four times a day (QID) | ORAL | 0 refills | Status: DC | PRN

## 2017-07-13 NOTE — Progress Notes (Signed)
   Subjective:    Patient ID: Melanie Mcdonald, female    DOB: May 16, 1933, 83 y.o.   MRN: [id number]  HPI Here to check on a sharp pain in the lower left rib area that started 4 days ago. No recent trauma or falls. No coughing or SOB. This pain is worsened by positional changes of her body or by taking deep breaths.    Review of Systems  Constitutional: Negative.   Respiratory: Negative.   Cardiovascular: Positive for chest pain. Negative for palpitations and leg swelling.  Gastrointestinal: Negative.        Objective:   Physical Exam  Constitutional: She appears well-developed and well-nourished.  Neck: No thyromegaly present.  Cardiovascular: Normal rate, regular rhythm, normal heart sounds and intact distal pulses.   Pulmonary/Chest: Effort normal and breath sounds normal. No respiratory distress. She has no wheezes. She has no rales.  Tender over the left lateral inferior ribs. No crepitus. No rashes   Lymphadenopathy:    She has no cervical adenopathy.          Assessment & Plan:  Chest wall pain. Rest, heat, use Percocet prn. This should resolve soon. Her BP is high today because the Losartan ran out a few week ago. This was refilled for a year. Alysia Penna, MD

## 2017-07-13 NOTE — Patient Instructions (Signed)
WE NOW OFFER   [facility] FAST TRACK!!!  SAME DAY Appointments for ACUTE CARE  Such as: Sprains, Injuries, cuts, abrasions, rashes, muscle pain, joint pain, back pain Colds, flu, sore throats, headache, allergies, cough, fever  Ear pain, sinus and eye infections Abdominal pain, nausea, vomiting, diarrhea, upset stomach Animal/insect bites  3 Easy Ways to Schedule: Walk-In Scheduling Call in scheduling Mychart Sign-up: [email or web address]         

## 2017-07-13 NOTE — Addendum Note (Signed)
Addended by: Aggie Hacker A on: 07/13/2017 12:11 PM   Modules accepted: Orders

## 2017-07-17 NOTE — Progress Notes (Signed)
  Subjective:  Patient ID: Melanie Mcdonald, female    DOB: 12-Sep-1933,  MRN: [id number]  83 y.o. female returns for Follow-up left heel wound. Believes the wound to be doing better. Denies pain. States that drainage has stopped. Denies N/V/F/Ch.  Objective:  There were no vitals filed for this visit. General AA&O x3. Normal mood and affect.  Vascular Dorsalis pedis and posterior tibial pulses  present 1+ left  Capillary refill normal to all digits. Pedal hair growth normal.  Neurologic Epicritic sensation grossly present.  Dermatologic (Wound) Wound Location: L heel Wound Measurement: 0.5 x 0.2 x 0.1 Wound Base: Granular/Healthy Peri-wound: soft Exudate: None: wound tissue dry Wound progress: Improved since last check.  Orthopedic: MMT 5/5 in dorsiflexion, plantarflexion, inversion, and eversion. Normal lower extremity joint ROM without pain or crepitus.   Assessment & Plan:  Patient was evaluated and treated and all questions answered.  L Heel Wound -No debridement performed today. -Wound improving. -Dressed with padding and DSD.  F/u in 1 week

## 2017-07-19 ENCOUNTER — Emergency Department (HOSPITAL_COMMUNITY): Payer: Medicare Other

## 2017-07-19 ENCOUNTER — Observation Stay (HOSPITAL_COMMUNITY): Payer: Medicare Other

## 2017-07-19 ENCOUNTER — Inpatient Hospital Stay (HOSPITAL_COMMUNITY)
Admission: EM | Admit: 2017-07-19 | Discharge: 2017-07-24 | DRG: 041 | Disposition: A | Discharge status: Skilled Nursing Facility | Payer: Medicare Other | Attending: Internal Medicine | Admitting: Internal Medicine

## 2017-07-19 ENCOUNTER — Encounter (HOSPITAL_COMMUNITY): Payer: Self-pay | Admitting: Internal Medicine

## 2017-07-19 DIAGNOSIS — G459 Transient cerebral ischemic attack, unspecified: Secondary | ICD-10-CM

## 2017-07-19 DIAGNOSIS — I10 Essential (primary) hypertension: Secondary | ICD-10-CM | POA: Diagnosis present

## 2017-07-19 DIAGNOSIS — I2510 Atherosclerotic heart disease of native coronary artery without angina pectoris: Secondary | ICD-10-CM | POA: Diagnosis present

## 2017-07-19 DIAGNOSIS — E11621 Type 2 diabetes mellitus with foot ulcer: Secondary | ICD-10-CM | POA: Diagnosis present

## 2017-07-19 DIAGNOSIS — E860 Dehydration: Secondary | ICD-10-CM | POA: Diagnosis present

## 2017-07-19 DIAGNOSIS — Z882 Allergy status to sulfonamides status: Secondary | ICD-10-CM

## 2017-07-19 DIAGNOSIS — L02619 Cutaneous abscess of unspecified foot: Secondary | ICD-10-CM | POA: Diagnosis present

## 2017-07-19 DIAGNOSIS — Z794 Long term (current) use of insulin: Secondary | ICD-10-CM

## 2017-07-19 DIAGNOSIS — F339 Major depressive disorder, recurrent, unspecified: Secondary | ICD-10-CM | POA: Diagnosis not present

## 2017-07-19 DIAGNOSIS — I63412 Cerebral infarction due to embolism of left middle cerebral artery: Principal | ICD-10-CM | POA: Diagnosis present

## 2017-07-19 DIAGNOSIS — R531 Weakness: Secondary | ICD-10-CM

## 2017-07-19 DIAGNOSIS — R739 Hyperglycemia, unspecified: Secondary | ICD-10-CM | POA: Diagnosis not present

## 2017-07-19 DIAGNOSIS — Z9849 Cataract extraction status, unspecified eye: Secondary | ICD-10-CM

## 2017-07-19 DIAGNOSIS — Z66 Do not resuscitate: Secondary | ICD-10-CM | POA: Diagnosis present

## 2017-07-19 DIAGNOSIS — I050 Rheumatic mitral stenosis: Secondary | ICD-10-CM | POA: Diagnosis present

## 2017-07-19 DIAGNOSIS — G4730 Sleep apnea, unspecified: Secondary | ICD-10-CM

## 2017-07-19 DIAGNOSIS — T3695XA Adverse effect of unspecified systemic antibiotic, initial encounter: Secondary | ICD-10-CM | POA: Diagnosis present

## 2017-07-19 DIAGNOSIS — E669 Obesity, unspecified: Secondary | ICD-10-CM | POA: Diagnosis present

## 2017-07-19 DIAGNOSIS — E119 Type 2 diabetes mellitus without complications: Secondary | ICD-10-CM | POA: Diagnosis not present

## 2017-07-19 DIAGNOSIS — L97529 Non-pressure chronic ulcer of other part of left foot with unspecified severity: Secondary | ICD-10-CM | POA: Diagnosis present

## 2017-07-19 DIAGNOSIS — M1990 Unspecified osteoarthritis, unspecified site: Secondary | ICD-10-CM | POA: Diagnosis present

## 2017-07-19 DIAGNOSIS — E1165 Type 2 diabetes mellitus with hyperglycemia: Secondary | ICD-10-CM

## 2017-07-19 DIAGNOSIS — E1142 Type 2 diabetes mellitus with diabetic polyneuropathy: Secondary | ICD-10-CM | POA: Diagnosis present

## 2017-07-19 DIAGNOSIS — K5730 Diverticulosis of large intestine without perforation or abscess without bleeding: Secondary | ICD-10-CM | POA: Diagnosis present

## 2017-07-19 DIAGNOSIS — I110 Hypertensive heart disease with heart failure: Secondary | ICD-10-CM | POA: Diagnosis present

## 2017-07-19 DIAGNOSIS — Z881 Allergy status to other antibiotic agents status: Secondary | ICD-10-CM

## 2017-07-19 DIAGNOSIS — Z90710 Acquired absence of both cervix and uterus: Secondary | ICD-10-CM

## 2017-07-19 DIAGNOSIS — J449 Chronic obstructive pulmonary disease, unspecified: Secondary | ICD-10-CM | POA: Diagnosis present

## 2017-07-19 DIAGNOSIS — B3731 Acute candidiasis of vulva and vagina: Secondary | ICD-10-CM | POA: Diagnosis present

## 2017-07-19 DIAGNOSIS — E11649 Type 2 diabetes mellitus with hypoglycemia without coma: Secondary | ICD-10-CM | POA: Diagnosis not present

## 2017-07-19 DIAGNOSIS — Z832 Family history of diseases of the blood and blood-forming organs and certain disorders involving the immune mechanism: Secondary | ICD-10-CM

## 2017-07-19 DIAGNOSIS — Z23 Encounter for immunization: Secondary | ICD-10-CM

## 2017-07-19 DIAGNOSIS — Z886 Allergy status to analgesic agent status: Secondary | ICD-10-CM

## 2017-07-19 DIAGNOSIS — I639 Cerebral infarction, unspecified: Secondary | ICD-10-CM | POA: Diagnosis present

## 2017-07-19 DIAGNOSIS — Z888 Allergy status to other drugs, medicaments and biological substances status: Secondary | ICD-10-CM

## 2017-07-19 DIAGNOSIS — R4701 Aphasia: Secondary | ICD-10-CM | POA: Diagnosis present

## 2017-07-19 DIAGNOSIS — Z7951 Long term (current) use of inhaled steroids: Secondary | ICD-10-CM

## 2017-07-19 DIAGNOSIS — Z8673 Personal history of transient ischemic attack (TIA), and cerebral infarction without residual deficits: Secondary | ICD-10-CM

## 2017-07-19 DIAGNOSIS — E782 Mixed hyperlipidemia: Secondary | ICD-10-CM

## 2017-07-19 DIAGNOSIS — B373 Candidiasis of vulva and vagina: Secondary | ICD-10-CM | POA: Diagnosis not present

## 2017-07-19 DIAGNOSIS — Z8719 Personal history of other diseases of the digestive system: Secondary | ICD-10-CM

## 2017-07-19 DIAGNOSIS — Z825 Family history of asthma and other chronic lower respiratory diseases: Secondary | ICD-10-CM

## 2017-07-19 DIAGNOSIS — E871 Hypo-osmolality and hyponatremia: Secondary | ICD-10-CM | POA: Diagnosis not present

## 2017-07-19 DIAGNOSIS — G47419 Narcolepsy without cataplexy: Secondary | ICD-10-CM | POA: Diagnosis present

## 2017-07-19 DIAGNOSIS — M549 Dorsalgia, unspecified: Secondary | ICD-10-CM | POA: Diagnosis present

## 2017-07-19 DIAGNOSIS — I6340 Cerebral infarction due to embolism of unspecified cerebral artery: Secondary | ICD-10-CM

## 2017-07-19 DIAGNOSIS — K219 Gastro-esophageal reflux disease without esophagitis: Secondary | ICD-10-CM | POA: Diagnosis present

## 2017-07-19 DIAGNOSIS — Z961 Presence of intraocular lens: Secondary | ICD-10-CM | POA: Diagnosis present

## 2017-07-19 DIAGNOSIS — I5032 Chronic diastolic (congestive) heart failure: Secondary | ICD-10-CM | POA: Diagnosis present

## 2017-07-19 DIAGNOSIS — Z6835 Body mass index (BMI) 35.0-35.9, adult: Secondary | ICD-10-CM

## 2017-07-19 DIAGNOSIS — E785 Hyperlipidemia, unspecified: Secondary | ICD-10-CM | POA: Diagnosis present

## 2017-07-19 DIAGNOSIS — I5033 Acute on chronic diastolic (congestive) heart failure: Secondary | ICD-10-CM

## 2017-07-19 DIAGNOSIS — Z880 Allergy status to penicillin: Secondary | ICD-10-CM

## 2017-07-19 DIAGNOSIS — G8929 Other chronic pain: Secondary | ICD-10-CM | POA: Diagnosis present

## 2017-07-19 DIAGNOSIS — E1151 Type 2 diabetes mellitus with diabetic peripheral angiopathy without gangrene: Secondary | ICD-10-CM | POA: Diagnosis present

## 2017-07-19 DIAGNOSIS — G2581 Restless legs syndrome: Secondary | ICD-10-CM | POA: Diagnosis present

## 2017-07-19 LAB — PROTIME-INR
INR: 1.04
PROTHROMBIN TIME: 13.5 s (ref 11.4–15.2)

## 2017-07-19 LAB — CBC
HEMATOCRIT: 41.3 % (ref 36.0–46.0)
HEMOGLOBIN: 13.6 g/dL (ref 12.0–15.0)
MCH: 28.8 pg (ref 26.0–34.0)
MCHC: 32.9 g/dL (ref 30.0–36.0)
MCV: 87.5 fL (ref 78.0–100.0)
Platelets: 157 10*3/uL (ref 150–400)
RBC: 4.72 MIL/uL (ref 3.87–5.11)
RDW: 13.9 % (ref 11.5–15.5)
WBC: 6.6 10*3/uL (ref 4.0–10.5)

## 2017-07-19 LAB — I-STAT TROPONIN, ED: Troponin i, poc: 0 ng/mL (ref 0.00–0.08)

## 2017-07-19 LAB — BASIC METABOLIC PANEL
ANION GAP: 9 (ref 5–15)
BUN: 23 mg/dL — AB (ref 6–20)
CHLORIDE: 97 mmol/L — AB (ref 101–111)
CO2: 26 mmol/L (ref 22–32)
Calcium: 9 mg/dL (ref 8.9–10.3)
Creatinine, Ser: 0.75 mg/dL (ref 0.44–1.00)
GFR calc Af Amer: >60 mL/min (ref >60)
GLUCOSE: 503 mg/dL — AB (ref 65–99)
POTASSIUM: 4.4 mmol/L (ref 3.5–5.1)
Sodium: 132 mmol/L — ABNORMAL LOW (ref 135–145)

## 2017-07-19 LAB — GLUCOSE, CAPILLARY: Glucose-Capillary: 337 mg/dL — ABNORMAL HIGH (ref 65–99)

## 2017-07-19 LAB — URINALYSIS, ROUTINE W REFLEX MICROSCOPIC
BACTERIA UA: NONE SEEN
BILIRUBIN URINE: NEGATIVE
Glucose, UA: >=500 mg/dL — AB
HGB URINE DIPSTICK: NEGATIVE
Ketones, ur: NEGATIVE mg/dL
NITRITE: NEGATIVE
PH: 6 (ref 5.0–8.0)
Protein, ur: NEGATIVE mg/dL
SPECIFIC GRAVITY, URINE: 1.03 (ref 1.005–1.030)

## 2017-07-19 LAB — RAPID URINE DRUG SCREEN, HOSP PERFORMED
Amphetamines: NOT DETECTED
Barbiturates: NOT DETECTED
Benzodiazepines: NOT DETECTED
Cocaine: NOT DETECTED
OPIATES: NOT DETECTED
Tetrahydrocannabinol: NOT DETECTED

## 2017-07-19 LAB — CBG MONITORING, ED
GLUCOSE-CAPILLARY: 380 mg/dL — AB (ref 65–99)
Glucose-Capillary: 476 mg/dL — ABNORMAL HIGH (ref 65–99)

## 2017-07-19 LAB — ETHANOL: Alcohol, Ethyl (B): <10 mg/dL (ref <10)

## 2017-07-19 LAB — APTT: APTT: 28 s (ref 24–36)

## 2017-07-19 MED ORDER — BENZONATATE 100 MG PO CAPS: 200.0000 mg | ORAL_CAPSULE | Freq: Two times a day (BID) | ORAL | Status: DC | PRN

## 2017-07-19 MED ORDER — ACETAMINOPHEN 325 MG PO TABS: 650.0000 mg | ORAL_TABLET | ORAL | Status: DC | PRN

## 2017-07-19 MED ORDER — INSULIN ASPART 100 UNIT/ML ~~LOC~~ SOLN
0.0000 [IU] | Freq: Three times a day (TID) | SUBCUTANEOUS | Status: DC
  Administered 2017-07-21 – 2017-07-24 (×2): 2 [IU] via SUBCUTANEOUS

## 2017-07-19 MED ORDER — STROKE: EARLY STAGES OF RECOVERY BOOK
Freq: Once | Status: AC
  Administered 2017-07-20
  Filled 2017-07-19: qty 1

## 2017-07-19 MED ORDER — OXYCODONE HCL 5 MG PO TABS
5.0000 mg | ORAL_TABLET | Freq: Four times a day (QID) | ORAL | Status: DC | PRN
  Administered 2017-07-20 – 2017-07-24 (×5): 5 mg via ORAL
  Filled 2017-07-19 (×5): qty 1

## 2017-07-19 MED ORDER — ACETAMINOPHEN 160 MG/5ML PO SOLN: 650.0000 mg | ORAL | Status: DC | PRN

## 2017-07-19 MED ORDER — INSULIN REGULAR HUMAN (CONC) 500 UNIT/ML ~~LOC~~ SOPN
70.0000 [IU] | PEN_INJECTOR | Freq: Every day | SUBCUTANEOUS | Status: DC
  Administered 2017-07-20 – 2017-07-24 (×5): 70 [IU] via SUBCUTANEOUS
  Filled 2017-07-19: qty 3

## 2017-07-19 MED ORDER — ACETAMINOPHEN 650 MG RE SUPP: 650.0000 mg | RECTAL | Status: DC | PRN

## 2017-07-19 MED ORDER — ZOLPIDEM TARTRATE 5 MG PO TABS
5.0000 mg | ORAL_TABLET | Freq: Every day | ORAL | Status: DC
  Administered 2017-07-20 – 2017-07-23 (×5): 5 mg via ORAL
  Filled 2017-07-19 (×5): qty 1

## 2017-07-19 MED ORDER — ALBUTEROL SULFATE (2.5 MG/3ML) 0.083% IN NEBU: 2.5000 mg | INHALATION_SOLUTION | RESPIRATORY_TRACT | Status: DC | PRN

## 2017-07-19 MED ORDER — SODIUM CHLORIDE 0.9 % IV SOLN
INTRAVENOUS | Status: DC
  Administered 2017-07-20: 1000 mL via INTRAVENOUS

## 2017-07-19 MED ORDER — ALBUTEROL SULFATE HFA 108 (90 BASE) MCG/ACT IN AERS: 2.0000 | INHALATION_SPRAY | RESPIRATORY_TRACT | Status: DC | PRN

## 2017-07-19 MED ORDER — INSULIN ASPART 100 UNIT/ML ~~LOC~~ SOLN
0.0000 [IU] | Freq: Every day | SUBCUTANEOUS | Status: DC
  Administered 2017-07-23: 2 [IU] via SUBCUTANEOUS

## 2017-07-19 MED ORDER — ENOXAPARIN SODIUM 40 MG/0.4ML ~~LOC~~ SOLN
40.0000 mg | SUBCUTANEOUS | Status: DC
  Administered 2017-07-20 – 2017-07-23 (×5): 40 mg via SUBCUTANEOUS
  Filled 2017-07-19 (×6): qty 0.4

## 2017-07-19 MED ORDER — LOSARTAN POTASSIUM 50 MG PO TABS
50.0000 mg | ORAL_TABLET | Freq: Every day | ORAL | Status: DC
  Administered 2017-07-20 – 2017-07-24 (×5): 50 mg via ORAL
  Filled 2017-07-19 (×5): qty 1

## 2017-07-19 MED ORDER — SENNOSIDES-DOCUSATE SODIUM 8.6-50 MG PO TABS: 1.0000 | ORAL_TABLET | Freq: Every evening | ORAL | Status: DC | PRN

## 2017-07-19 MED ORDER — SODIUM CHLORIDE 0.9 % IV BOLUS (SEPSIS)
1000.0000 mL | Freq: Once | INTRAVENOUS | Status: AC
  Administered 2017-07-19: 1000 mL via INTRAVENOUS

## 2017-07-19 MED ORDER — INSULIN ASPART 100 UNIT/ML ~~LOC~~ SOLN
8.0000 [IU] | Freq: Once | SUBCUTANEOUS | Status: AC
  Administered 2017-07-19: 8 [IU] via SUBCUTANEOUS
  Filled 2017-07-19: qty 1

## 2017-07-19 MED ORDER — OXYCODONE-ACETAMINOPHEN 10-325 MG PO TABS
1.0000 | ORAL_TABLET | Freq: Four times a day (QID) | ORAL | Status: DC | PRN
Start: 2017-07-19 — End: 2017-07-19

## 2017-07-19 MED ORDER — INSULIN REGULAR HUMAN (CONC) 500 UNIT/ML ~~LOC~~ SOPN
30.0000 [IU] | PEN_INJECTOR | Freq: Two times a day (BID) | SUBCUTANEOUS | Status: DC
  Administered 2017-07-20 – 2017-07-21 (×3): 30 [IU] via SUBCUTANEOUS
  Filled 2017-07-19: qty 3

## 2017-07-19 MED ORDER — GABAPENTIN 300 MG PO CAPS
300.0000 mg | ORAL_CAPSULE | Freq: Every day | ORAL | Status: DC | PRN
  Administered 2017-07-20 – 2017-07-24 (×5): 300 mg via ORAL
  Filled 2017-07-19 (×5): qty 1

## 2017-07-19 MED ORDER — OXYCODONE-ACETAMINOPHEN 5-325 MG PO TABS
1.0000 | ORAL_TABLET | Freq: Four times a day (QID) | ORAL | Status: DC | PRN
  Administered 2017-07-20 – 2017-07-24 (×5): 1 via ORAL
  Filled 2017-07-19 (×5): qty 1

## 2017-07-19 NOTE — H&P (Signed)
History and Physical    Melanie Mcdonald:[id number] DOB: 06-Jun-1933 DOA: 07/19/2017  PCP: Laurey Morale, MD Consultants:  Buddy Duty - endocrinology; March Rummage - podiatry Patient coming from:  Home - lives with son; [facility]: son, [phone]  Chief Complaint: weakness  HPI: Melanie Mcdonald is a 83 y.o. female with medical history significant of TIA; PVD; narcolepsy; HTN; HLD; DM; depression; CAD; COPD; and chronic pain presenting due to "Strokish".  Her son noticed that she was different since Saturday.  She gets confused and can't talk.  She knows what she wants to say but can't get the words out.  Her symptoms are intermittent, but she has been getting chronically weaker the whole time. She had a mini-stroke 4-5 times in the last 3-4 years.  She had one Saturday and it just keeps coming back.  Most times she relaxes and calms down and within 3-4 hours it goes away but this time it kept coming back.  She has a broken rib and this is causing her pain on the left.  She has a vaginal yeast infection from antibiotics.  She has an abscess on her foot that they have been treating with antibiotics; this is getting better.  No dysphagia, just expressive apahsia.  Generalized weakness.  +cough, nonproductive.  +SOB, no wheezing.  Slight diarrhea - 1 loose stool yesterday; they think this is from the antibiotics.  Sugars usually run a little high (200 range) but not this high.  Since she has been feeling bad she has not been checking her sugars regularly.     ED Course: Nonfocal exam, no aphasia.  +hyperglycemia.  Review of Systems: As per HPI; otherwise review of systems reviewed and negative.   Ambulatory Status:  Ambulates with a walker at baseline  Past Medical History:  Diagnosis Date  . ANEMIA 01/30/2008  . ARTHRITIS 12/11/2007  . BACK PAIN, CHRONIC 06/23/2008  . BREAST CYST, RIGHT 12/17/2007  . Colon polyps    FRAGMENTS OF HYPERPLASTIC POLYP  . CONTACT DERMATITIS 03/10/2009  . COPD (chronic obstructive  pulmonary disease) ([place])   . CORONARY ARTERY DISEASE 03/01/2007   had a normal Myoview stress test 07-13-11  . CYSTOCELE WITHOUT MENTION UTERINE PROLAPSE LAT 09/12/2010  . DEGENERATIVE JOINT DISEASE 08/07/2007  . DEPRESSION 03/01/2007  . DIABETES MELLITUS, TYPE II 08/26/2008   sees Dr. Buddy Duty   . Diverticulosis of colon (without mention of hemorrhage)   . Esophageal reflux 11/06/2007  . HYPERKERATOSIS 06/02/2009  . HYPERLIPIDEMIA 08/26/2008  . HYPERTENSION 03/01/2007  . Irritable bowel syndrome 01/26/2009  . LABYRINTHITIS 11/03/2009  . 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 NEPHROLITHIASIS 04/29/2009  . OBESITY 03/01/2007  . PERIPHERAL VASCULAR DISEASE 01/26/2009  . SYNCOPE 08/26/2008   had brain MRI on 06-28-12 showing only chronic microvascular ischemia and atrophy   . TRANSIENT ISCHEMIC ATTACK 10/20/2009   had normal brain MRA with patent vertebrals and carotids 06-28-12    Past Surgical History:  Procedure Laterality Date  . ABDOMINAL HYSTERECTOMY    . BACK SURGERY     x2  . CARDIAC CATHETERIZATION  07/2009  . CATARACT EXTRACTION    . COLONOSCOPY  11-13-07   per Dr. Deatra Ina, benign polyps, repeat in 5 yrs  . ESOPHAGOGASTRODUODENOSCOPY  11-13-07   per Dr. Deatra Ina, normal   . INTRAOCULAR LENS INSERTION    . SPINE SURGERY     x 3  . TONSILECTOMY, ADENOIDECTOMY, BILATERAL MYRINGOTOMY AND TUBES      Social History   Social History  .  Marital status: Widowed    Spouse name: N/A  . Number of children: N/A  . Years of education: N/A   Occupational History  . Not on file.   Social History Main Topics  . Smoking status: Never Smoker  . Smokeless tobacco: Never Used  . Alcohol use No  . Drug use: No  . Sexual activity: No   Other Topics Concern  . Not on file   Social History Narrative  . No narrative on file    Allergies  Allergen Reactions  . Aspirin Other (See Comments)    Abdominal pain  . Ciprofloxacin Nausea And Vomiting    Made pt very sick on stomach  .  Clarithromycin     Made stomach "burn"  . Doxycycline Itching  . Erythromycin Nausea And Vomiting  . Lisinopril Cough  . Lyrica [Pregabalin] Other (See Comments)    Suicidal   . Macrobid [Nitrofurantoin Monohyd Macro] Nausea And Vomiting  . Metolazone Other (See Comments)    Pt stated this made her B/P drop  . Other Nausea And Vomiting    Pt cannot have any of the -mycin(s)  . Penicillins Swelling    From childhood: swelling @ injection site Has patient had a PCN reaction causing immediate rash, facial/tongue/throat swelling, SOB or lightheadedness with hypotension: Yes Has patient had a PCN reaction causing severe rash involving mucus membranes or skin necrosis: No Has patient had a PCN reaction that required hospitalization: No Has patient had a PCN reaction occurring within the last 10 years: No If all of the above answers are "NO", then may proceed with Cephalosporin use.   . Sulfonamide Derivatives Nausea And Vomiting  . Valtrex [Valacyclovir Hcl] Nausea Only    Family History  Problem Relation Age of Onset  . Lupus Mother   . COPD Father     Prior to Admission medications   Medication Sig Start Date End Date Taking? Authorizing Provider  albuterol (PROVENTIL HFA;VENTOLIN HFA) 108 (90 Base) MCG/ACT inhaler Inhale 2 puffs into the lungs every 4 (four) hours as needed for wheezing or shortness of breath. 01/16/17  Yes Nafziger, Tommi Rumps, NP  benzonatate (TESSALON) 200 MG capsule Take 200 mg by mouth 2 (two) times daily as needed for cough.   Yes [provider]  Camphor-Eucalyptus-Menthol (VICKS VAPORUB EX) Apply 1 application topically daily as needed (for congestion).   Yes [provider]  cephALEXin (KEFLEX) 500 MG capsule Take 500 mg by mouth 3 (three) times daily. FOR 10 DAYS 07/17/17 07/26/17 Yes [provider]  dextroamphetamine (DEXEDRINE SPANSULE) 10 MG 24 hr capsule Take 20 mg by mouth daily as needed (as needed for alertness/prior to driving  for narcolepsy).   Yes [provider]  ENSURE (ENSURE) Take 237 mLs by mouth 4 (four) times daily. Reported on 02/08/2016   Yes [provider]  furosemide (LASIX) 40 MG tablet Take 1 tablet (40 mg total) by mouth 2 (two) times daily. TAKE 1 IN THE MORNING AND ONE IN THE EVENING Patient taking differently: Take 40 mg by mouth 2 (two) times daily as needed for fluid or edema (in the legs/feet). 40 mg two times a day as needed for edema or fluid 11/29/15  Yes Laurey Morale, MD  gabapentin (NEURONTIN) 300 MG capsule Take 1 capsule (300 mg total) by mouth 3 (three) times daily. Patient taking differently: Take 300 mg by mouth daily as needed (for nerve pain in the feet).  06/13/16  Yes Laurey Morale, MD  Glucos-Chond-Hyal Ac-Ca Fructo (MOVE FREE JOINT HEALTH ADVANCE) TABS Take 1 tablet by mouth 2 (two) times daily.   Yes [provider]  insulin regular human CONCENTRATED (HUMULIN R U-500 KWIKPEN) 500 UNIT/ML kwikpen Take 60units at breakfast 50 at lunch and 50 at supper Patient taking differently: Inject 30-70 Units into the skin See admin instructions. 70 units before breakfast then 30 units before lunch then 30 units at bedtime 10/23/16  Yes Laurey Morale, MD  ketoconazole (NIZORAL) 2 % cream Apply 1 application topically 2 (two) times daily as needed for irritation (between bikini lines).   Yes [provider]  nitroGLYCERIN (NITROSTAT) 0.4 MG SL tablet Place 1 tablet (0.4 mg total) under the tongue every 5 (five) minutes as needed for chest pain. 01/21/16  Yes Laurey Morale, MD  ondansetron (ZOFRAN) 8 MG tablet Take 1 tablet (8 mg total) by mouth every 8 (eight) hours as needed for nausea or vomiting. 06/13/16  Yes Laurey Morale, MD  OVER THE COUNTER MEDICATION [employer] caplets for Leg Cramps (Cinchona Off. 3X HPUS; Viscum Alb. 3X HPUS; Gnaphalium Poly. 3X HPUS; Rhus Tox. 6X HPUS; Aconitum Nap. 6X HPUS; Ledum Pal. 6X HPUS; Magnesia Phos. 6X HPUS. Inactive Ingredients:  Lactose NF, Microcrystalline Cellulose, and Vegetable Magnesium Stearate): Take 2 caplets by mouth up to four times a day as needed for leg cramps   Yes [provider]  oxyCODONE-acetaminophen (PERCOCET) 10-325 MG tablet Take 1 tablet by mouth every 6 (six) hours as needed for pain. 07/13/17  Yes Laurey Morale, MD  phenazopyridine (AZO-TABS) 95 MG tablet Take 95 mg by mouth 3 (three) times daily as needed for pain.   Yes [provider]  pramipexole (MIRAPEX) 1 MG tablet TAKE 5mg =5 TABLETS BY MOUTH daily at bedtime Patient taking differently: Take 4-5 mg by mouth at bedtime as needed (for restless legs).  10/11/16  Yes Donzetta Starch, NP  zolpidem (AMBIEN) 10 MG tablet TAKE 1 TABLET BY MOUTH AT BEDTIME Patient taking differently: Take 10 mg by mouth at bedtime 07/10/17  Yes Laurey Morale, MD  aluminum sulfate-calcium acetate [facility]) packet Apply 1 packet topically 3 (three) times daily. Patient not taking: Reported on 07/19/2017 04/20/17   Trula Slade, DPM  aspirin EC 81 MG EC tablet Take 1 tablet (81 mg total) by mouth daily. Patient not taking: Reported on 07/19/2017 10/11/16   Reyne Dumas, MD  cyclobenzaprine (FLEXERIL) 10 MG tablet Take 1 tablet (10 mg total) by mouth at bedtime. Patient not taking: Reported on 07/19/2017 06/14/15   Cameron Sprang, MD  Delafloxacin Meglumine (BAXDELA) 450 MG TABS Take 1 tablet by mouth 2 (two) times daily. Patient not taking: Reported on 07/19/2017 05/25/17   Trula Slade, DPM  Fluticasone-Salmeterol (ADVAIR DISKUS) 250-50 MCG/DOSE AEPB Inhale 1 puff into the lungs 2 (two) times daily. Patient not taking: Reported on 07/19/2017 01/16/17   Dorothyann Peng, NP  glucose blood (ACCU-CHEK AVIVA) test strip Test once per day and diagnosis code is E 11.9 06/28/16   Laurey Morale, MD  HYDROcodone-homatropine (HYDROMET) 5-1.5 MG/5ML syrup Take 5 mLs by mouth every 4 (four) hours as needed. Patient not taking: Reported on 07/19/2017  04/02/17   Laurey Morale, MD  hydrocortisone (CORTEF) 5 MG tablet Take 5 mg by mouth daily. WITH FOOD OR MILK 07/08/17   [provider]  indomethacin (INDOCIN) 50 MG capsule Take 1 capsule (50 mg total) by mouth 3 (three) times daily as needed for moderate  pain. Patient not taking: Reported on 07/19/2017 06/28/16   Laurey Morale, MD  LORazepam (ATIVAN) 1 MG tablet Take 1 tablet (1 mg total) by mouth 2 (two) times daily. Patient not taking: Reported on 07/19/2017 10/11/16   Reyne Dumas, MD  losartan (COZAAR) 50 MG tablet Take 1 tablet (50 mg total) by mouth daily. 07/13/17   Laurey Morale, MD    Physical Exam: Vitals:   07/19/17 2030 07/19/17 2130 07/19/17 2223 07/20/17 0023  BP:  (!) 161/68 (!) 164/59 140/82  Pulse: 83 76 79 78  Resp: 17 17 18 18   Temp:   99.1 F (37.3 C) 98.4 F (36.9 C)  TempSrc:   Oral Oral  SpO2: 99% 97% 98% 98%  Weight:   105.8 kg (233 lb 4.8 oz)   Height:   5\' 8"  (1.727 m)      General: Appears emotionally labile and is NAD Eyes:  PERRL, EOMI, normal lids, iris ENT:  grossly normal hearing, lips & tongue, mmm Neck:  no LAD, masses or thyromegaly; no carotid bruits Cardiovascular:  RRR, no m/r/g. No LE edema.  Respiratory:   CTA bilaterally with no wheezes/rales/rhonchi.  Normal respiratory effort. Abdomen:  soft, NT, ND, NABS Skin:  no rash or induration seen on limited exam Musculoskeletal:  grossly normal tone BUE/BLE, good ROM, no bony abnormality, mild generalized weakness Psychiatric: Depressed mood and affect, speech fluent and appropriate, AOx3 Neurologic:  CN 2-12 grossly intact, moves all extremities in coordinated fashion, sensation intact    Radiological Exams on Admission: Dg Chest 2 View  Result Date: 07/19/2017 CLINICAL DATA:  TIA EXAM: CHEST  2 VIEW COMPARISON:  10/09/2016 FINDINGS: Subsegmental atelectasis at the left base. Borderline to mild cardiomegaly with minimal central congestion. No consolidation or effusion. Aortic  atherosclerosis. No pneumothorax. Degenerative changes of the spine. IMPRESSION: 1. Borderline to mild cardiomegaly with mild central congestion. 2. Subsegmental atelectasis at the left base Electronically Signed   By: Donavan Foil M.D.   On: 07/19/2017 23:59   Ct Head Wo Contrast  Result Date: 07/19/2017 CLINICAL DATA:  83 year old female with progressive weakness for 1 week. EXAM: CT HEAD WITHOUT CONTRAST TECHNIQUE: Contiguous axial images were obtained from the base of the skull through the vertex without intravenous contrast. COMPARISON:  Head CT without contrast 03/30/2017. Brain MRI 10/10/2016, and earlier. FINDINGS: Brain: Cerebral volume remains normal for age. Stable mild for age cerebral white matter hypodensity. No midline shift, ventriculomegaly, mass effect, evidence of mass lesion, intracranial hemorrhage or evidence of cortically based acute infarction. Vascular: Calcified atherosclerosis at the skull base. No suspicious intracranial vascular hyperdensity. Skull: No acute osseous abnormality identified. Sinuses/Orbits: Visualized paranasal sinuses and mastoids are stable and well pneumatized. Other: No acute orbit or scalp soft tissue findings. IMPRESSION: 1.  No acute intracranial abnormality. 2. Stable non contrast CT appearance of the brain since June, with mild for age chronic white matter changes. Electronically Signed   By: Genevie Ann M.D.   On: 07/19/2017 16:21   Mr Brain Wo Contrast  Result Date: 07/20/2017 CLINICAL DATA:  83 y/o F; weakness, slurred speech, word-finding difficulty. Mild headache. EXAM: MRI HEAD WITHOUT CONTRAST MRA HEAD WITHOUT CONTRAST TECHNIQUE: Multiplanar, multiecho pulse sequences of the brain and surrounding structures were obtained without intravenous contrast. Angiographic images of the head were obtained using MRA technique without contrast. COMPARISON:  07/19/2017 CT of the head.  10/10/2016 MRI of the head. FINDINGS: MRI HEAD FINDINGS Brain: Subcentimeter  foci of reduced diffusion compatible with  acute/early subacute infarction are present within the left inferomedial and superolateral cerebellar hemisphere, left ventrolateral thalamus, and scattered foci are present left parietal subcortical white matter. (Series 3: Image 12, 16, 25, 31, 35). Foci of acute/early subacute infarction demonstrate associated T2 FLAIR hyperintense signal abnormality. No significant mass effect. No abnormal susceptibility hypointensity to indicate intracranial hemorrhage. Stable background of scattered nonspecific foci of T2 FLAIR hyperintensity in subcortical and periventricular white matter is compatible with mild chronic microvascular ischemic changes for age and there is mild brain parenchymal volume loss. No hydrocephalus, extra-axial collection, effacement of basilar cisterns, or herniation. Vascular: As below. Skull and upper cervical spine: Normal marrow signal. Sinuses/Orbits: Mild anterior ethmoid sinus mucosal thickening. Otherwise negative. Other: Bilateral intra-ocular lens replacement. MRA HEAD FINDINGS Internal carotid arteries:  Patent. Anterior cerebral arteries:  Patent. Middle cerebral arteries: Patent. Anterior communicating artery: Patent. Posterior communicating arteries: Patent left. No right identified, likely hypoplastic or absent. Posterior cerebral arteries:  Patent. Basilar artery:  Patent. Vertebral arteries:  Patent. No evidence of high-grade stenosis, large vessel occlusion, or aneurysm identified. IMPRESSION: 1. Several subcentimeter foci of acute/early subacute infarction are present within left cerebellar hemisphere, left thalamus, and left parietal subcortical white matter. No associated mass effect or hemorrhage. Multiple vascular distributions favors embolic etiology. 2. Stable background of mild chronic microvascular ischemic changes and parenchymal volume loss of the brain. 3. Patent circle of Willis. No high-grade stenosis, large vessel occlusion,  or aneurysm identified. These results will be called to the ordering clinician or representative by the Radiologist Assistant, and communication documented in the PACS or zVision Dashboard. Electronically Signed   By: Kristine Garbe M.D.   On: 07/20/2017 00:08   [name] Head Wo Contrast  Result Date: 07/20/2017 CLINICAL DATA:  83 y/o F; weakness, slurred speech, word-finding difficulty. Mild headache. EXAM: MRI HEAD WITHOUT CONTRAST MRA HEAD WITHOUT CONTRAST TECHNIQUE: Multiplanar, multiecho pulse sequences of the brain and surrounding structures were obtained without intravenous contrast. Angiographic images of the head were obtained using MRA technique without contrast. COMPARISON:  07/19/2017 CT of the head.  10/10/2016 MRI of the head. FINDINGS: MRI HEAD FINDINGS Brain: Subcentimeter foci of reduced diffusion compatible with acute/early subacute infarction are present within the left inferomedial and superolateral cerebellar hemisphere, left ventrolateral thalamus, and scattered foci are present left parietal subcortical white matter. (Series 3: Image 12, 16, 25, 31, 35). Foci of acute/early subacute infarction demonstrate associated T2 FLAIR hyperintense signal abnormality. No significant mass effect. No abnormal susceptibility hypointensity to indicate intracranial hemorrhage. Stable background of scattered nonspecific foci of T2 FLAIR hyperintensity in subcortical and periventricular white matter is compatible with mild chronic microvascular ischemic changes for age and there is mild brain parenchymal volume loss. No hydrocephalus, extra-axial collection, effacement of basilar cisterns, or herniation. Vascular: As below. Skull and upper cervical spine: Normal marrow signal. Sinuses/Orbits: Mild anterior ethmoid sinus mucosal thickening. Otherwise negative. Other: Bilateral intra-ocular lens replacement. MRA HEAD FINDINGS Internal carotid arteries:  Patent. Anterior cerebral arteries:  Patent.  Middle cerebral arteries: Patent. Anterior communicating artery: Patent. Posterior communicating arteries: Patent left. No right identified, likely hypoplastic or absent. Posterior cerebral arteries:  Patent. Basilar artery:  Patent. Vertebral arteries:  Patent. No evidence of high-grade stenosis, large vessel occlusion, or aneurysm identified. IMPRESSION: 1. Several subcentimeter foci of acute/early subacute infarction are present within left cerebellar hemisphere, left thalamus, and left parietal subcortical white matter. No associated mass effect or hemorrhage. Multiple vascular distributions favors embolic etiology. 2. Stable background of mild chronic  microvascular ischemic changes and parenchymal volume loss of the brain. 3. Patent circle of Willis. No high-grade stenosis, large vessel occlusion, or aneurysm identified. These results will be called to the ordering clinician or representative by the Radiologist Assistant, and communication documented in the PACS or zVision Dashboard. Electronically Signed   By: Kristine Garbe M.D.   On: 07/20/2017 00:08    EKG: Independently reviewed.  NSR with rate 88; nonspecific ST changes with no evidence of acute ischemia   Labs on Admission: I have personally reviewed the available labs and imaging studies at the time of the admission.  Pertinent labs:   Glucose 476, 380, 337 CO2 26 BUN 23/Creatinine 0.75/GFR >60 WBC 6.6 Troponin 0.00, <0.03 A1c 10.9 UA: glu >500, small LE UDS negative  Assessment/Plan Principal Problem:   CVA (cerebral vascular accident) ([place]) Active Problems:   Hyperlipidemia   Depression, recurrent ([place])   Narcolepsy without cataplexy   Essential hypertension   Uncontrolled type 2 diabetes mellitus with hyperglycemia, with long-term current use of insulin (HCC)   Candidiasis of genitalia in female   CVA -Patient with vague symptoms including weakness and intermittent expressive aphasia for several  days -Symptoms concerning for TIA/CVA but not overly suggestive of this diagnosis -Unfortunately, MRI shows what appears to be embolic showering in multiple vascular distributions -Will place in observation status for CVA/TIA evaluation -Telemetry monitoring -Carotid dopplers -Echo; she may need a TEE -ASA allergy, will start Plavix -PT/OT/ST/Nutrition Consults -Neurology consult -Family requests CM consult for home health assistance  HTN -Allow permissive HTN -Treat BP only if >220/120, and then with goal of 15% reduction -Hold Cozaar and plan to restart in 48-72 hours  HLD -FLP in 5/18 with good control: TC 128, HDL 52, LDL 63, TG 61 -She does not appear to be taking statin therapy at this time  DM -Poor control as evidenced by A1c 10.9 -This is despite rx for U500, which begs the question of compliance -Will resume U500 and cover with moderate-scale SSI -Monitor closely for hypoglycemia with resumption of home dose of insulin given concerns about compliance  Depression/Narcolepsy -Hold dextroamphetamine as she is unlikely to need this medication while hospitalized -Consider SSRI  Candidiasis -Treat with Diflucan IV x 1 and topical monistat  DVT prophylaxis: Lovenox - she may need AC due to concern for embolic source for CVA Code Status:  DNR - confirmed with patient/family Family Communication: Son present throughout evaluation  Disposition Plan: To be determined Consults called: Neurology; PT/OT/ST/Nutrition/CM Admission status: It is my clinical opinion that referral for OBSERVATION is reasonable and necessary in this patient based on the above information provided. The aforementioned taken together are felt to place the patient at high risk for further clinical deterioration. However it is anticipated that the patient may be medically stable for discharge from the hospital within 24 to 48 hours.    [name] MD [facility]  If note is complete, please  contact covering daytime or nighttime physician. [email or web address] Password [email or web address]  07/20/2017, 2:09 AM

## 2017-07-19 NOTE — ED Notes (Signed)
Attempted to call report X1. Left name and number

## 2017-07-19 NOTE — Progress Notes (Signed)
Received report from [facility] in ED. Patient coming to room 109 is an 83 yo female with weakness, word finding problems and confusion. Being worked up for TIA.Awaiting arrival.

## 2017-07-19 NOTE — ED Provider Notes (Signed)
[facility] EMERGENCY DEPARTMENT Provider Note   CSN: [id number] Arrival date & time: 07/19/17  1422     History   Chief Complaint Chief Complaint  Patient presents with  . Weakness    HPI Melanie Mcdonald is a 83 y.o. female.  HPI Pt presents to the ED for evaluation of weakness.  Pt states her symptoms started last Saturday.  She has been having trouble with slurred speech, difficulty finding her words and genneralized weakness.  She has been having a few episodes of diarrhea per day and her blood sugards have been running high.  She has had a mild headache but not unilateral weakness.  Her son states she has just been in the bed the last few days.  She usually has a lot of leg swelling but that has decreased.  No recent falls or injury.  No CP or SOB.   No fever.  No abd pain or vomiting.  Note:nursing notes indicate dysphagia, pt is having trouble speaking not swallowing       Patient Active Problem List   Diagnosis Date Noted  . Uncontrolled type 2 diabetes mellitus with hyperglycemia, with long-term current use of insulin ([place]) 10/09/2016  . Dysarthria 10/09/2016  . Cellulitis of right foot 06/13/2016  . Gout 06/07/2016  . Leg swelling 04/05/2016  . Pain in joint, ankle and foot 12/24/2015  . Diabetic polyneuropathy associated with diabetes mellitus due to underlying condition ([place]) 06/14/2015  . Cervicogenic headache 06/14/2015  . Weakness generalized 11/13/2014  . TIA (transient ischemic attack) 11/13/2014  . Gait instability 11/13/2014  . Acute on chronic diastolic CHF (congestive heart failure), NYHA class 3 ([place]) 11/03/2014  . Obstructive apnea 11/03/2014  . DOE (dyspnea on exertion) 11/03/2014  . Diabetes mellitus without complication ([place]) 35/57/3220  . Thyroid nodule 04/28/2014  . Personal history of colonic polyps 03/09/2014  . Abdominal pain, unspecified site 03/09/2014  . Dizziness 02/10/2014  . Memory loss 12/30/2013  . Headache  12/30/2013  . Abdominal pain, chronic, right lower quadrant 06/03/2013  . Hypokalemia 08/15/2011  . Diarrhea 08/15/2011  . Cellulitis of left leg 08/14/2011  . Chest pain 06/08/2011  . HIP PAIN 09/15/2010  . CYSTOCELE WITHOUT MENTION UTERINE PROLAPSE LAT 09/12/2010  . Labyrinthitis 11/03/2009  . Transient cerebral ischemia 10/20/2009  . NEPHROLITHIASIS 04/29/2009  . Peripheral vascular disease ([place]) 01/26/2009  . IRRITABLE BOWEL SYNDROME 01/26/2009  . Edema 09/02/2008  . Type 2 diabetes mellitus ([place]) 08/26/2008  . Hyperlipidemia 08/26/2008  . Narcolepsy without cataplexy 08/26/2008  . HYPOGLYCEMIA 06/23/2008  . Backache 06/23/2008  . Arthropathy 12/11/2007  . Insomnia 12/11/2007  . Esophageal reflux 11/06/2007  . Osteoarthritis 08/07/2007  . OBESITY 03/01/2007  . Depression, recurrent ([place]) 03/01/2007  . SYNDROME, RESTLESS LEGS 03/01/2007  . Essential hypertension 03/01/2007  . Coronary atherosclerosis 03/01/2007    Past Surgical History:  Procedure Laterality Date  . ABDOMINAL HYSTERECTOMY    . BACK SURGERY     x2  . CARDIAC CATHETERIZATION  07/2009  . CATARACT EXTRACTION    . COLONOSCOPY  11-13-07   per Dr. Deatra Ina, benign polyps, repeat in 5 yrs  . ESOPHAGOGASTRODUODENOSCOPY  11-13-07   per Dr. Deatra Ina, normal   . INTRAOCULAR LENS INSERTION    . SPINE SURGERY     x 3  . TONSILECTOMY, ADENOIDECTOMY, BILATERAL MYRINGOTOMY AND TUBES      OB History    No data available       Home Medications    Prior  to Admission medications   Medication Sig Start Date End Date Taking? Authorizing Provider  albuterol (PROVENTIL HFA;VENTOLIN HFA) 108 (90 Base) MCG/ACT inhaler Inhale 2 puffs into the lungs every 4 (four) hours as needed for wheezing or shortness of breath. 01/16/17  Yes Nafziger, Tommi Rumps, NP  benzonatate (TESSALON) 200 MG capsule Take 200 mg by mouth 2 (two) times daily as needed for cough.   Yes [provider]  Camphor-Eucalyptus-Menthol (VICKS VAPORUB EX)  Apply 1 application topically daily as needed (for congestion).   Yes [provider]  cephALEXin (KEFLEX) 500 MG capsule Take 500 mg by mouth 3 (three) times daily. FOR 10 DAYS 07/17/17 07/26/17 Yes [provider]  dextroamphetamine (DEXEDRINE SPANSULE) 10 MG 24 hr capsule Take 20 mg by mouth daily as needed (as needed for alertness/prior to driving for narcolepsy).   Yes [provider]  ENSURE (ENSURE) Take 237 mLs by mouth 4 (four) times daily. Reported on 02/08/2016   Yes [provider]  furosemide (LASIX) 40 MG tablet Take 1 tablet (40 mg total) by mouth 2 (two) times daily. TAKE 1 IN THE MORNING AND ONE IN THE EVENING Patient taking differently: Take 40 mg by mouth 2 (two) times daily as needed for fluid or edema (in the legs/feet). 40 mg two times a day as needed for edema or fluid 11/29/15  Yes Laurey Morale, MD  gabapentin (NEURONTIN) 300 MG capsule Take 1 capsule (300 mg total) by mouth 3 (three) times daily. Patient taking differently: Take 300 mg by mouth daily as needed (for nerve pain in the feet).  06/13/16  Yes Laurey Morale, MD  Glucos-Chond-Hyal Ac-Ca Fructo (MOVE FREE JOINT HEALTH ADVANCE) TABS Take 1 tablet by mouth 2 (two) times daily.   Yes [provider]  insulin regular human CONCENTRATED (HUMULIN R U-500 KWIKPEN) 500 UNIT/ML kwikpen Take 60units at breakfast 50 at lunch and 50 at supper Patient taking differently: Inject 30-70 Units into the skin See admin instructions. 70 units before breakfast then 30 units before lunch then 30 units at bedtime 10/23/16  Yes Laurey Morale, MD  ketoconazole (NIZORAL) 2 % cream Apply 1 application topically 2 (two) times daily as needed for irritation (between bikini lines).   Yes [provider]  nitroGLYCERIN (NITROSTAT) 0.4 MG SL tablet Place 1 tablet (0.4 mg total) under the tongue every 5 (five) minutes as needed for chest pain. 01/21/16  Yes Laurey Morale, MD  ondansetron (ZOFRAN) 8  MG tablet Take 1 tablet (8 mg total) by mouth every 8 (eight) hours as needed for nausea or vomiting. 06/13/16  Yes Laurey Morale, MD  OVER THE COUNTER MEDICATION [employer] caplets for Leg Cramps (Cinchona Off. 3X HPUS; Viscum Alb. 3X HPUS; Gnaphalium Poly. 3X HPUS; Rhus Tox. 6X HPUS; Aconitum Nap. 6X HPUS; Ledum Pal. 6X HPUS; Magnesia Phos. 6X HPUS. Inactive Ingredients: Lactose NF, Microcrystalline Cellulose, and Vegetable Magnesium Stearate): Take 2 caplets by mouth up to four times a day as needed for leg cramps   Yes [provider]  oxyCODONE-acetaminophen (PERCOCET) 10-325 MG tablet Take 1 tablet by mouth every 6 (six) hours as needed for pain. 07/13/17  Yes Laurey Morale, MD  phenazopyridine (AZO-TABS) 95 MG tablet Take 95 mg by mouth 3 (three) times daily as needed for pain.   Yes [provider]  pramipexole (MIRAPEX) 1 MG tablet TAKE 5mg =5 TABLETS BY MOUTH daily at bedtime Patient taking differently: Take 4-5 mg by mouth at bedtime as needed (  for restless legs).  10/11/16  Yes Donzetta Starch, NP  zolpidem (AMBIEN) 10 MG tablet TAKE 1 TABLET BY MOUTH AT BEDTIME Patient taking differently: Take 10 mg by mouth at bedtime 07/10/17  Yes Laurey Morale, MD  aluminum sulfate-calcium acetate [facility]) packet Apply 1 packet topically 3 (three) times daily. Patient not taking: Reported on 07/19/2017 04/20/17   Trula Slade, DPM  aspirin EC 81 MG EC tablet Take 1 tablet (81 mg total) by mouth daily. Patient not taking: Reported on 07/19/2017 10/11/16   Reyne Dumas, MD  cyclobenzaprine (FLEXERIL) 10 MG tablet Take 1 tablet (10 mg total) by mouth at bedtime. Patient not taking: Reported on 07/19/2017 06/14/15   Cameron Sprang, MD  Delafloxacin Meglumine (BAXDELA) 450 MG TABS Take 1 tablet by mouth 2 (two) times daily. Patient not taking: Reported on 07/19/2017 05/25/17   Trula Slade, DPM  Fluticasone-Salmeterol (ADVAIR DISKUS) 250-50 MCG/DOSE AEPB Inhale 1 puff into the lungs 2  (two) times daily. Patient not taking: Reported on 07/19/2017 01/16/17   Dorothyann Peng, NP  glucose blood (ACCU-CHEK AVIVA) test strip Test once per day and diagnosis code is E 11.9 06/28/16   Laurey Morale, MD  HYDROcodone-homatropine (HYDROMET) 5-1.5 MG/5ML syrup Take 5 mLs by mouth every 4 (four) hours as needed. Patient not taking: Reported on 07/19/2017 04/02/17   Laurey Morale, MD  hydrocortisone (CORTEF) 5 MG tablet Take 5 mg by mouth daily. WITH FOOD OR MILK 07/08/17   [provider]  indomethacin (INDOCIN) 50 MG capsule Take 1 capsule (50 mg total) by mouth 3 (three) times daily as needed for moderate pain. Patient not taking: Reported on 07/19/2017 06/28/16   Laurey Morale, MD  LORazepam (ATIVAN) 1 MG tablet Take 1 tablet (1 mg total) by mouth 2 (two) times daily. Patient not taking: Reported on 07/19/2017 10/11/16   Reyne Dumas, MD  losartan (COZAAR) 50 MG tablet Take 1 tablet (50 mg total) by mouth daily. 07/13/17   Laurey Morale, MD    Family History Family History  Problem Relation Age of Onset  . Lupus Mother   . COPD Father     Social History Social History  Substance Use Topics  . Smoking status: Never Smoker  . Smokeless tobacco: Never Used  . Alcohol use No     Allergies   Aspirin; Ciprofloxacin; Clarithromycin; Doxycycline; Erythromycin; Lisinopril; Lyrica [pregabalin]; Macrobid [nitrofurantoin monohyd macro]; Metolazone; Other; Penicillins; Sulfonamide derivatives; and Valtrex [valacyclovir hcl]   Review of Systems Review of Systems  Constitutional: Negative for fever.  Respiratory: Negative for shortness of breath.   Cardiovascular: Negative for chest pain.  Gastrointestinal: Negative for abdominal pain.  Genitourinary: Negative for dysuria.  Neurological: Positive for speech difficulty, weakness and headaches. Negative for seizures.  All other systems reviewed and are negative.    Physical Exam Updated Vital Signs BP 135/65   Pulse 84    Temp 98.2 F (36.8 C) (Oral)   Resp 15   SpO2 94%   Physical Exam  Constitutional: She appears listless. No distress.  HENT:  Head: Normocephalic and atraumatic.  Right Ear: External ear normal.  Left Ear: External ear normal.  Mouth/Throat: Mucous membranes are dry. No oropharyngeal exudate or posterior oropharyngeal erythema.  Eyes: Conjunctivae are normal. Right eye exhibits no discharge. Left eye exhibits no discharge. No scleral icterus.  Neck: Neck supple. No tracheal deviation present.  Cardiovascular: Normal rate, regular rhythm and intact distal pulses.   Pulmonary/Chest: Effort normal  and breath sounds normal. No stridor. No respiratory distress. She has no wheezes. She has no rales.  Abdominal: Soft. Bowel sounds are normal. She exhibits no distension. There is no tenderness. There is no rebound and no guarding.  Musculoskeletal: She exhibits no edema or tenderness.  Neurological: She appears listless. She is not disoriented. No cranial nerve deficit (No facial droop, extraocular movements intact, tongue midline ) or sensory deficit. She exhibits normal muscle tone. She displays no seizure activity. Coordination normal.  No pronator drift bilateral upper extrem, able to hold both legs off bed for 5 seconds, sensation intact in all extremities, no visual field cuts, no left or right sided neglect, normal finger-nose exam bilaterally, no nystagmus noted, generalized weakness in all extremities, movements are slow   Skin: Skin is warm and dry. No rash noted.  Psychiatric: She has a normal mood and affect.  Nursing note and vitals reviewed.    ED Treatments / Results  Labs (all labs ordered are listed, but only abnormal results are displayed) Labs Reviewed  BASIC METABOLIC PANEL - Abnormal; Notable for the following:       Result Value   Sodium 132 (*)    Chloride 97 (*)    Glucose, Bld 503 (*)    BUN 23 (*)    All other components within normal limits  CBG MONITORING,  ED - Abnormal; Notable for the following:    Glucose-Capillary 476 (*)    All other components within normal limits  CBC  ETHANOL  PROTIME-INR  APTT  URINALYSIS, ROUTINE W REFLEX MICROSCOPIC  RAPID URINE DRUG SCREEN, HOSP PERFORMED  HEPATIC FUNCTION PANEL  DIFFERENTIAL  APTT  PROTIME-INR  I-STAT TROPONIN, ED    EKG  EKG Interpretation  Date/Time:  Thursday July 19 2017 14:27:16 EDT Ventricular Rate:  88 PR Interval:  188 QRS Duration: 76 QT Interval:  380 QTC Calculation: 459 R Axis:   21 Text Interpretation:  Normal sinus rhythm Possible Left atrial enlargement Cannot rule out Anterior infarct , age undetermined Borderline criteria for st elevation in inferior  leads Baseline wander Confirmed by Pattricia Boss [phone]) on 07/19/2017 2:40:26 PM Also confirmed by Pattricia Boss [phone]), editor Philomena Doheny [phone])  on 07/19/2017 3:06:10 PM       Radiology Ct Head Wo Contrast  Result Date: 07/19/2017 CLINICAL DATA:  83 year old female with progressive weakness for 1 week. EXAM: CT HEAD WITHOUT CONTRAST TECHNIQUE: Contiguous axial images were obtained from the base of the skull through the vertex without intravenous contrast. COMPARISON:  Head CT without contrast 03/30/2017. Brain MRI 10/10/2016, and earlier. FINDINGS: Brain: Cerebral volume remains normal for age. Stable mild for age cerebral white matter hypodensity. No midline shift, ventriculomegaly, mass effect, evidence of mass lesion, intracranial hemorrhage or evidence of cortically based acute infarction. Vascular: Calcified atherosclerosis at the skull base. No suspicious intracranial vascular hyperdensity. Skull: No acute osseous abnormality identified. Sinuses/Orbits: Visualized paranasal sinuses and mastoids are stable and well pneumatized. Other: No acute orbit or scalp soft tissue findings. IMPRESSION: 1.  No acute intracranial abnormality. 2. Stable non contrast CT appearance of the brain since June, with mild for age  chronic white matter changes. Electronically Signed   By: Genevie Ann M.D.   On: 07/19/2017 16:21    Procedures Procedures (including critical care time)  Medications Ordered in ED Medications  sodium chloride 0.9 % bolus 1,000 mL (1,000 mLs Intravenous New Bag/Given 07/19/17 1636)  insulin aspart (novoLOG) injection 8 Units (8 Units Subcutaneous Given 07/19/17  1641)     Initial Impression / Assessment and Plan / ED Course  I have reviewed the triage vital signs and the nursing notes.  Pertinent labs & imaging results that were available during my care of the patient were reviewed by me and considered in my medical decision making (see chart for details).  Clinical Course as of Jul 20 1807  Thu Jul 19, 2017  1805 Pt states she had an episode in the ED where she had trouble remembering how to spell her name.  No focal deficits noted on my exam currently  [JK]    Clinical Course User Index [JK] Dorie Rank, MD   Pt presented to the ED with complaints of weakness, speech difficulty this past week.  Intermittent episodes occurring frequently.    Patient had generalized weakness on my exam but no focal deficits. I do notAppreciate any aphasia at this time. Patient is hyperglycemic and does appear somewhat dehydrated.  She does have several risk factors for TIA stroke. I'm concerned about her recurrent symptoms. Initial CT scan does not show any acute findings but I do feel that she warrants admission for further workup and evaluation.  Final Clinical Impressions(s) / ED Diagnoses   Final diagnoses:  Weakness  TIA (transient ischemic attack)  Hyperglycemia      Dorie Rank, MD 07/19/17 [id number]

## 2017-07-19 NOTE — ED Triage Notes (Signed)
Pt arrives via POV from home with son who states patient has been progressively weak since Saturday. No slurred speech, facial droop, aphasia, drift noted. VAN negative. Denies recent fever, pain. Pt alert, oriented x4, no acute distress noted.

## 2017-07-19 NOTE — ED Notes (Signed)
Glucose 503 per lab

## 2017-07-19 NOTE — ED Notes (Addendum)
Pt had episode of dysphagia x5 days ago and had been increasingly weak ever since per son. Pt is alert and oreinted but lethargic. Pt reporting headache, denies nausea/ vomiting but admits to diarrhea. MD bedside for evaluation. Pt admits she has not been taking her insulin for the past few days, diabetic wounds on lower legs bilaterally.

## 2017-07-19 NOTE — ED Notes (Signed)
Admitting doctor at bedside 

## 2017-07-19 NOTE — ED Notes (Signed)
Pt refusing straight cath related to "lesions from another time".

## 2017-07-20 ENCOUNTER — Ambulatory Visit: Payer: Medicare Other | Admitting: Podiatry

## 2017-07-20 ENCOUNTER — Encounter (HOSPITAL_COMMUNITY): Payer: Medicare Other

## 2017-07-20 ENCOUNTER — Observation Stay (HOSPITAL_COMMUNITY): Payer: Medicare Other

## 2017-07-20 DIAGNOSIS — Z7951 Long term (current) use of inhaled steroids: Secondary | ICD-10-CM | POA: Diagnosis not present

## 2017-07-20 DIAGNOSIS — R739 Hyperglycemia, unspecified: Secondary | ICD-10-CM | POA: Diagnosis not present

## 2017-07-20 DIAGNOSIS — Z9849 Cataract extraction status, unspecified eye: Secondary | ICD-10-CM | POA: Diagnosis not present

## 2017-07-20 DIAGNOSIS — I639 Cerebral infarction, unspecified: Secondary | ICD-10-CM

## 2017-07-20 DIAGNOSIS — K219 Gastro-esophageal reflux disease without esophagitis: Secondary | ICD-10-CM | POA: Diagnosis present

## 2017-07-20 DIAGNOSIS — E1151 Type 2 diabetes mellitus with diabetic peripheral angiopathy without gangrene: Secondary | ICD-10-CM | POA: Diagnosis present

## 2017-07-20 DIAGNOSIS — Z880 Allergy status to penicillin: Secondary | ICD-10-CM | POA: Diagnosis not present

## 2017-07-20 DIAGNOSIS — I63412 Cerebral infarction due to embolism of left middle cerebral artery: Secondary | ICD-10-CM | POA: Diagnosis present

## 2017-07-20 DIAGNOSIS — Z90710 Acquired absence of both cervix and uterus: Secondary | ICD-10-CM | POA: Diagnosis not present

## 2017-07-20 DIAGNOSIS — Z881 Allergy status to other antibiotic agents status: Secondary | ICD-10-CM | POA: Diagnosis not present

## 2017-07-20 DIAGNOSIS — I110 Hypertensive heart disease with heart failure: Secondary | ICD-10-CM | POA: Diagnosis present

## 2017-07-20 DIAGNOSIS — F339 Major depressive disorder, recurrent, unspecified: Secondary | ICD-10-CM | POA: Diagnosis not present

## 2017-07-20 DIAGNOSIS — Z23 Encounter for immunization: Secondary | ICD-10-CM | POA: Diagnosis present

## 2017-07-20 DIAGNOSIS — Z961 Presence of intraocular lens: Secondary | ICD-10-CM | POA: Diagnosis present

## 2017-07-20 DIAGNOSIS — E871 Hypo-osmolality and hyponatremia: Secondary | ICD-10-CM | POA: Diagnosis not present

## 2017-07-20 DIAGNOSIS — B373 Candidiasis of vulva and vagina: Secondary | ICD-10-CM

## 2017-07-20 DIAGNOSIS — I5030 Unspecified diastolic (congestive) heart failure: Secondary | ICD-10-CM | POA: Diagnosis not present

## 2017-07-20 DIAGNOSIS — I6340 Cerebral infarction due to embolism of unspecified cerebral artery: Secondary | ICD-10-CM

## 2017-07-20 DIAGNOSIS — I10 Essential (primary) hypertension: Secondary | ICD-10-CM | POA: Diagnosis not present

## 2017-07-20 DIAGNOSIS — I2510 Atherosclerotic heart disease of native coronary artery without angina pectoris: Secondary | ICD-10-CM | POA: Diagnosis present

## 2017-07-20 DIAGNOSIS — I6389 Other cerebral infarction: Secondary | ICD-10-CM

## 2017-07-20 DIAGNOSIS — B3731 Acute candidiasis of vulva and vagina: Secondary | ICD-10-CM

## 2017-07-20 DIAGNOSIS — G47419 Narcolepsy without cataplexy: Secondary | ICD-10-CM | POA: Diagnosis present

## 2017-07-20 DIAGNOSIS — G2581 Restless legs syndrome: Secondary | ICD-10-CM | POA: Diagnosis present

## 2017-07-20 DIAGNOSIS — E119 Type 2 diabetes mellitus without complications: Secondary | ICD-10-CM | POA: Diagnosis not present

## 2017-07-20 DIAGNOSIS — Z8673 Personal history of transient ischemic attack (TIA), and cerebral infarction without residual deficits: Secondary | ICD-10-CM | POA: Diagnosis not present

## 2017-07-20 DIAGNOSIS — G459 Transient cerebral ischemic attack, unspecified: Secondary | ICD-10-CM | POA: Diagnosis present

## 2017-07-20 DIAGNOSIS — Z794 Long term (current) use of insulin: Secondary | ICD-10-CM | POA: Diagnosis not present

## 2017-07-20 DIAGNOSIS — E785 Hyperlipidemia, unspecified: Secondary | ICD-10-CM | POA: Diagnosis present

## 2017-07-20 DIAGNOSIS — R079 Chest pain, unspecified: Secondary | ICD-10-CM

## 2017-07-20 DIAGNOSIS — R531 Weakness: Secondary | ICD-10-CM | POA: Diagnosis not present

## 2017-07-20 DIAGNOSIS — L02619 Cutaneous abscess of unspecified foot: Secondary | ICD-10-CM | POA: Diagnosis present

## 2017-07-20 DIAGNOSIS — E1165 Type 2 diabetes mellitus with hyperglycemia: Secondary | ICD-10-CM | POA: Diagnosis not present

## 2017-07-20 DIAGNOSIS — Z825 Family history of asthma and other chronic lower respiratory diseases: Secondary | ICD-10-CM | POA: Diagnosis not present

## 2017-07-20 DIAGNOSIS — E1142 Type 2 diabetes mellitus with diabetic polyneuropathy: Secondary | ICD-10-CM | POA: Diagnosis present

## 2017-07-20 DIAGNOSIS — I5032 Chronic diastolic (congestive) heart failure: Secondary | ICD-10-CM | POA: Diagnosis present

## 2017-07-20 DIAGNOSIS — Z832 Family history of diseases of the blood and blood-forming organs and certain disorders involving the immune mechanism: Secondary | ICD-10-CM | POA: Diagnosis not present

## 2017-07-20 DIAGNOSIS — Z886 Allergy status to analgesic agent status: Secondary | ICD-10-CM | POA: Diagnosis not present

## 2017-07-20 DIAGNOSIS — E782 Mixed hyperlipidemia: Secondary | ICD-10-CM | POA: Diagnosis not present

## 2017-07-20 HISTORY — DX: Candidiasis of vulva and vagina: B37.3

## 2017-07-20 HISTORY — DX: Acute candidiasis of vulva and vagina: B37.31

## 2017-07-20 HISTORY — DX: Cerebral infarction, unspecified: I63.9

## 2017-07-20 HISTORY — DX: Chest pain, unspecified: R07.9

## 2017-07-20 LAB — LIPID PANEL
CHOL/HDL RATIO: 2.7 ratio
Cholesterol: 109 mg/dL (ref 0–200)
HDL: 40 mg/dL — ABNORMAL LOW (ref >40)
LDL CALC: 42 mg/dL (ref 0–99)
Triglycerides: 136 mg/dL (ref <150)
VLDL: 27 mg/dL (ref 0–40)

## 2017-07-20 LAB — HEPATIC FUNCTION PANEL
ALBUMIN: 3.2 g/dL — AB (ref 3.5–5.0)
ALK PHOS: 84 U/L (ref 38–126)
ALT: 14 U/L (ref 14–54)
AST: 14 U/L — AB (ref 15–41)
BILIRUBIN DIRECT: 0.1 mg/dL (ref 0.1–0.5)
BILIRUBIN TOTAL: 0.5 mg/dL (ref 0.3–1.2)
Indirect Bilirubin: 0.4 mg/dL (ref 0.3–0.9)
Total Protein: 7 g/dL (ref 6.5–8.1)

## 2017-07-20 LAB — DIFFERENTIAL
BASOS PCT: 0 %
Basophils Absolute: 0 10*3/uL (ref 0.0–0.1)
EOS ABS: 0.2 10*3/uL (ref 0.0–0.7)
EOS PCT: 5 %
LYMPHS ABS: 2 10*3/uL (ref 0.7–4.0)
Lymphocytes Relative: 37 %
MONO ABS: 0.3 10*3/uL (ref 0.1–1.0)
MONOS PCT: 6 %
Neutro Abs: 2.8 10*3/uL (ref 1.7–7.7)
Neutrophils Relative %: 52 %

## 2017-07-20 LAB — HEMOGLOBIN A1C
HEMOGLOBIN A1C: 10.9 % — AB (ref 4.8–5.6)
Mean Plasma Glucose: 266.13 mg/dL

## 2017-07-20 LAB — GLUCOSE, CAPILLARY
GLUCOSE-CAPILLARY: 304 mg/dL — AB (ref 65–99)
GLUCOSE-CAPILLARY: 214 mg/dL — AB (ref 65–99)
GLUCOSE-CAPILLARY: 49 mg/dL — AB (ref 65–99)
GLUCOSE-CAPILLARY: 72 mg/dL (ref 65–99)
Glucose-Capillary: 58 mg/dL — ABNORMAL LOW (ref 65–99)
Glucose-Capillary: 74 mg/dL (ref 65–99)

## 2017-07-20 LAB — APTT: aPTT: 28 seconds (ref 24–36)

## 2017-07-20 LAB — TROPONIN I
Troponin I: <0.03 ng/mL (ref <0.03)
Troponin I: <0.03 ng/mL (ref <0.03)

## 2017-07-20 LAB — MRSA PCR SCREENING: MRSA by PCR: NEGATIVE

## 2017-07-20 MED ORDER — CLOPIDOGREL BISULFATE 75 MG PO TABS
75.0000 mg | ORAL_TABLET | Freq: Every day | ORAL | Status: DC
  Administered 2017-07-20 – 2017-07-24 (×4): 75 mg via ORAL
  Filled 2017-07-20 (×4): qty 1

## 2017-07-20 MED ORDER — IOPAMIDOL (ISOVUE-370) INJECTION 76%
INTRAVENOUS | Status: AC
  Administered 2017-07-20: 50 mL
  Filled 2017-07-20: qty 50

## 2017-07-20 MED ORDER — CEPHALEXIN 500 MG PO CAPS
500.0000 mg | ORAL_CAPSULE | Freq: Three times a day (TID) | ORAL | Status: DC
  Administered 2017-07-20 – 2017-07-24 (×11): 500 mg via ORAL
  Filled 2017-07-20 (×11): qty 1

## 2017-07-20 MED ORDER — MICONAZOLE NITRATE 2 % EX CREA
TOPICAL_CREAM | Freq: Two times a day (BID) | CUTANEOUS | Status: DC
  Administered 2017-07-20 – 2017-07-22 (×7): via TOPICAL
  Administered 2017-07-23: 1 via TOPICAL
  Administered 2017-07-24: 09:00:00 via TOPICAL
  Filled 2017-07-20 (×2): qty 28
  Filled 2017-07-20: qty 14

## 2017-07-20 MED ORDER — FLUCONAZOLE IN SODIUM CHLORIDE 200-0.9 MG/100ML-% IV SOLN
200.0000 mg | Freq: Once | INTRAVENOUS | Status: AC
  Administered 2017-07-20: 200 mg via INTRAVENOUS
  Filled 2017-07-20: qty 100

## 2017-07-20 MED ORDER — PNEUMOCOCCAL VAC POLYVALENT 25 MCG/0.5ML IJ INJ
0.5000 mL | INJECTION | INTRAMUSCULAR | Status: AC
  Administered 2017-07-20: 0.5 mL via INTRAMUSCULAR

## 2017-07-20 MED ORDER — PRAMIPEXOLE DIHYDROCHLORIDE 1 MG PO TABS
4.0000 mg | ORAL_TABLET | Freq: Every evening | ORAL | Status: DC | PRN
  Administered 2017-07-20 – 2017-07-23 (×3): 4 mg via ORAL
  Filled 2017-07-20 (×4): qty 4

## 2017-07-20 NOTE — Care Management Note (Signed)
Case Management Note  Patient Details  Name: FREDONIA CASALINO MRN: [id number] Date of Birth: [birth year]  Subjective/Objective:       Pt admitted with CVA. She is from home with family.             Action/Plan: PT/OT recommending SNF. CM following for d/c disposition.   Expected Discharge Date:  07/21/17               Expected Discharge Plan:  Skilled Nursing Facility  In-House Referral:  Clinical Social Work  Discharge planning Services     Post Acute Care Choice:    Choice offered to:     DME Arranged:    DME Agency:     HH Arranged:    HH Agency:     Status of Service:  In process, will continue to follow  If discussed at Long Length of Stay Meetings, dates discussed:    Additional Comments:  Pollie Friar, RN 07/20/2017, 2:15 PM

## 2017-07-20 NOTE — Progress Notes (Signed)
STROKE TEAM PROGRESS NOTE   HISTORY OF PRESENT ILLNESS (per record) Melanie Mcdonald is an 83 y.o. female who presented to the [facility] ED on Saturday with word finding problems, weakness and confusion. She arrived from home with her son, who stated that his mother had been progressively weak since Saturday. Also endorsed slurred speech, difficulty finding her words and generalized weakness. Per chart review, she knows what she wants to say but can't get the words out. Her symptoms are intermittent, but she has been getting chronicallyweaker the whole time. Of note, she has had a few episodes of diarrhea per day and blood sugars have been running high. She admitted to not having been taking her insulin for the past few days. Diabetic wounds on lower legs bilaterally were noted by ED nurse.   Her PMHx includes TIA, PVD, narcolepsy, HTN, HLD, DM, depression, CAD, COPD and chronic pain. She endorses 4-5 "ministrokes" in the past 3-4 years.    Not on an antiplatelet medication or blood thinner at home. Has an allergy to ASA.   SUBJECTIVE (INTERVAL HISTORY) Her son is at bedside, discussed stroke. Embolic nature and reviewed images with patient. Answered all questions.   OBJECTIVE Temp:  [98.2 F (36.8 C)-99.1 F (37.3 C)] 98.6 F (37 C) (10/19 0402) Pulse Rate:  [73-94] 75 (10/19 0402) Cardiac Rhythm: Heart block (10/19 0100) Resp:  [12-22] 17 (10/19 0402) BP: (134-178)/(49-142) 134/49 (10/19 0402) SpO2:  [94 %-100 %] 94 % (10/19 0402) Weight:  [233 lb 4.8 oz (105.8 kg)] 233 lb 4.8 oz (105.8 kg) (10/18 2223)  CBC:   Recent Labs Lab 07/19/17 1433 07/20/17 0010  WBC 6.6  --   NEUTROABS  --  2.8  HGB 13.6  --   HCT 41.3  --   MCV 87.5  --   PLT 157  --     Basic Metabolic Panel:   Recent Labs Lab 07/19/17 1433  NA 132*  K 4.4  CL 97*  CO2 26  GLUCOSE 503*  BUN 23*  CREATININE 0.75  CALCIUM 9.0    Lipid Panel:     Component Value Date/Time   CHOL 109 07/20/2017 0450    CHOL 160 12/21/2014 1153   TRIG 136 07/20/2017 0450   TRIG 62 12/21/2014 1153   HDL 40 (L) 07/20/2017 0450   HDL 74 12/21/2014 1153   CHOLHDL 2.7 07/20/2017 0450   VLDL 27 07/20/2017 0450   LDLCALC 42 07/20/2017 0450   LDLCALC 74 12/21/2014 1153   HgbA1c:  Lab Results  Component Value Date   HGBA1C 10.9 (H) 07/20/2017   Urine Drug Screen:     Component Value Date/Time   LABOPIA NONE DETECTED 07/19/2017 1949   COCAINSCRNUR NONE DETECTED 07/19/2017 1949   LABBENZ NONE DETECTED 07/19/2017 1949   AMPHETMU NONE DETECTED 07/19/2017 1949   THCU NONE DETECTED 07/19/2017 1949   LABBARB NONE DETECTED 07/19/2017 1949    Alcohol Level     Component Value Date/Time   ETH <10 07/19/2017 1433    IMAGING   Dg Chest 2 View 07/19/2017 1. Borderline to mild cardiomegaly with mild central congestion.  2. Subsegmental atelectasis at the left base    Ct Head Wo Contrast 07/19/2017 IMPRESSION:  1.  No acute intracranial abnormality.  2. Stable non contrast CT appearance of the brain since June, with mild for age chronic white matter changes.     Mr Melanie Mcdonald Head Wo Contrast 07/20/2017 IMPRESSION:  1. Several subcentimeter foci of acute/early subacute infarction  are present within left cerebellar hemisphere, left thalamus, and left parietal subcortical white matter. No associated mass effect or hemorrhage. Multiple vascular distributions favors embolic etiology.  2. Stable background of mild chronic microvascular ischemic changes and parenchymal volume loss of the brain.  3. Patent circle of Willis. No high-grade stenosis, large vessel occlusion, or aneurysm identified.     Transthoracic Echocardiogram - pending   Bilateral Carotid Dopplers - pending      PHYSICAL EXAM Vitals:   07/20/17 0017 07/20/17 0023 07/20/17 0202 07/20/17 0402  BP: 140/82 140/82 (!) 144/63 (!) 134/49  Pulse: 77 78 73 75  Resp: 16 18 16 17   Temp:  98.4 F (36.9 C) 98.6 F (37 C) 98.6 F (37 C)   TempSrc:  Oral Oral Oral  SpO2: 96% 98% 95% 94%  Weight:      Height:        PHYSICAL EXAM Physical exam: Exam: Gen: NAD Eyes: anicteric sclerae, moist conjunctivae                    CV: no MRG, no carotid bruits, no peripheral edema Mental Status: Alert, follows commands, good historian  Neuro: Detailed Neurologic Exam  Speech:    No aphasia, no dysarthria  Cranial Nerves:    The pupils are equal, round, and reactive to light.. Attempted, Fundi not visualized.  EOMI. No gaze preference. Visual fields full. Face symmetric, Tongue midline. Hearing intact to voice. Shoulder shrug intact  Motor Observation:    no involuntary movements noted. Tone appears normal.     Strength:    5/5     Sensation:  Intact to LT  Plantars downgoing.   ASSESSMENT/PLAN [name] is a 83 y.o. female with history of TIAs, PVD, narcolepsy, HTN, HLD, DM, depression, CAD, COPD and chronic pain presenting with word finding problems, weakness and confusion. She did not receive IV t-PA due to late presentation.  Strokes:  Several subcentimeter foci of acute/early subacute infarction on the left. Embolic - source unknown.   Resultant  No deficits  CT head - No acute intracranial abnormality.   MRI head - Several subcentimeter foci of acute/early subacute infarction are present within left cerebellar hemisphere, left thalamus, and left parietal subcortical white matter  MRA head - Patent circle of Willis. No high-grade stenosis, large vessel occlusion, or aneurysm identified.   Carotid Doppler - pending  2D Echo - pending  LDL - 42  HgbA1c - 10.9  VTE prophylaxis - Lovenox Diet heart healthy/carb modified Room service appropriate? Yes; Fluid consistency: Thin  No antithrombotic prior to admission, now on clopidogrel 75 mg daily - Reported aspirin allergy  Patient counseled to be compliant with her antithrombotic medications  Ongoing aggressive stroke risk factor  management  Therapy recommendations: pending  Disposition: Pending  Hypertension  Stable  Permissive hypertension (OK if < 220/120) but gradually normalize in 5-7 days  Long-term BP goal normotensive  Hyperlipidemia  Home meds:  No lipid lowering medications prior to admission  LDL 42, goal < 70   Diabetes  HgbA1c 10.9, goal < 7.0  Uncontrolled  Other Stroke Risk Factors  Advanced age  Obesity, Body mass index is 35.47 kg/m., recommend weight loss, diet and exercise as appropriate   Hx stroke/TIA  Coronary artery disease   Other Active Problems  Mild hyponatremia - 132  Poorly controlled diabetes  Reported aspirin allergy   PLAN  Stat CTA Head and Neck - patient with left sided neck pain ->  R/O dissection.  TEE / loop tentatively scheduled for Monday.  Hospital day # 0   To contact Stroke Continuity provider, please refer to [email or web address] After hours, contact General Neurology

## 2017-07-20 NOTE — Progress Notes (Signed)
Patient refused Novolog. Patient states "My diabetic doctor said not to take anything but Humulin"

## 2017-07-20 NOTE — Consult Note (Signed)
Referring Physician: Dr. Ree Kida    Chief Complaint: Embolic strokes seen on MRI  HPI: Melanie Mcdonald is an 83 y.o. female who presented to the [facility] ED on Saturday with word finding problems, weakness and confusion. She arrived from home with her son, who stated that his mother had been progressively weak since Saturday. Also endorsed slurred speech, difficulty finding her words and generalized weakness. Per chart review, "She knows what she wants to say but can't get the words out.  Her symptoms are intermittent, but she has been getting chronically weaker the whole time." Of note, she has had a few episodes of diarrhea per day and blood sugars have been running high. She admitted to not having been taking her insulin for the past few days. Diabetic wounds on lower legs bilaterally were noted by ED nurse.   Her PMHx includes TIA, PVD, narcolepsy, HTN, HLD, DM, depression, CAD, COPD and chronic pain. She endorses 4-5 "ministrokes" in the past 3-4 years.     Not on an antiplatelet medication or blood thinner at home. Has an allergy to ASA.   Past Medical History:  Diagnosis Date  . ANEMIA 01/30/2008  . ARTHRITIS 12/11/2007  . BACK PAIN, CHRONIC 06/23/2008  . BREAST CYST, RIGHT 12/17/2007  . Colon polyps    FRAGMENTS OF HYPERPLASTIC POLYP  . CONTACT DERMATITIS 03/10/2009  . COPD (chronic obstructive pulmonary disease) ([place])   . CORONARY ARTERY DISEASE 03/01/2007   had a normal Myoview stress test 07-13-11  . CYSTOCELE WITHOUT MENTION UTERINE PROLAPSE LAT 09/12/2010  . DEGENERATIVE JOINT DISEASE 08/07/2007  . DEPRESSION 03/01/2007  . DIABETES MELLITUS, TYPE II 08/26/2008   sees Dr. Buddy Duty   . Diverticulosis of colon (without mention of hemorrhage)   . Esophageal reflux 11/06/2007  . HYPERKERATOSIS 06/02/2009  . HYPERLIPIDEMIA 08/26/2008  . HYPERTENSION 03/01/2007  . Irritable bowel syndrome 01/26/2009  . LABYRINTHITIS 11/03/2009  . 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 NEPHROLITHIASIS  04/29/2009  . OBESITY 03/01/2007  . PERIPHERAL VASCULAR DISEASE 01/26/2009  . SYNCOPE 08/26/2008   had brain MRI on 06-28-12 showing only chronic microvascular ischemia and atrophy   . TRANSIENT ISCHEMIC ATTACK 10/20/2009   had normal brain MRA with patent vertebrals and carotids 06-28-12    Past Surgical History:  Procedure Laterality Date  . ABDOMINAL HYSTERECTOMY    . BACK SURGERY     x2  . CARDIAC CATHETERIZATION  07/2009  . CATARACT EXTRACTION    . COLONOSCOPY  11-13-07   per Dr. Deatra Ina, benign polyps, repeat in 5 yrs  . ESOPHAGOGASTRODUODENOSCOPY  11-13-07   per Dr. Deatra Ina, normal   . INTRAOCULAR LENS INSERTION    . SPINE SURGERY     x 3  . TONSILECTOMY, ADENOIDECTOMY, BILATERAL MYRINGOTOMY AND TUBES      Family History  Problem Relation Age of Onset  . Lupus Mother   . COPD Father    Social History:  reports that she has never smoked. She has never used smokeless tobacco. She reports that she does not drink alcohol or use drugs.  Allergies:  Allergies  Allergen Reactions  . Aspirin Other (See Comments)    Abdominal pain  . Ciprofloxacin Nausea And Vomiting    Made pt very sick on stomach  . Clarithromycin     Made stomach "burn"  . Doxycycline Itching  . Erythromycin Nausea And Vomiting  . Lisinopril Cough  . Lyrica [Pregabalin] Other (See Comments)    Suicidal   . Macrobid [Nitrofurantoin Monohyd Macro] Nausea  And Vomiting  . Metolazone Other (See Comments)    Pt stated this made her B/P drop  . Other Nausea And Vomiting    Pt cannot have any of the -mycin(s)  . Penicillins Swelling    From childhood: swelling @ injection site Has patient had a PCN reaction causing immediate rash, facial/tongue/throat swelling, SOB or lightheadedness with hypotension: Yes Has patient had a PCN reaction causing severe rash involving mucus membranes or skin necrosis: No Has patient had a PCN reaction that required hospitalization: No Has patient had a PCN reaction occurring  within the last 10 years: No If all of the above answers are "NO", then may proceed with Cephalosporin use.   . Sulfonamide Derivatives Nausea And Vomiting  . Valtrex [Valacyclovir Hcl] Nausea Only    Medications:  Prior to Admission:  Prescriptions Prior to Admission  Medication Sig Dispense Refill Last Dose  . albuterol (PROVENTIL HFA;VENTOLIN HFA) 108 (90 Base) MCG/ACT inhaler Inhale 2 puffs into the lungs every 4 (four) hours as needed for wheezing or shortness of breath. 1 Inhaler 3 PRN at PRN  . benzonatate (TESSALON) 200 MG capsule Take 200 mg by mouth 2 (two) times daily as needed for cough.   PRN at PRN  . Camphor-Eucalyptus-Menthol (VICKS VAPORUB EX) Apply 1 application topically daily as needed (for congestion).   PRN at PRN  . cephALEXin (KEFLEX) 500 MG capsule Take 500 mg by mouth 3 (three) times daily. FOR 10 DAYS  2 07/19/2017 at am  . dextroamphetamine (DEXEDRINE SPANSULE) 10 MG 24 hr capsule Take 20 mg by mouth daily as needed (as needed for alertness/prior to driving for narcolepsy).   PRN at PRN  . ENSURE (ENSURE) Take 237 mLs by mouth 4 (four) times daily. Reported on 02/08/2016   07/19/2017 at am  . furosemide (LASIX) 40 MG tablet Take 1 tablet (40 mg total) by mouth 2 (two) times daily. TAKE 1 IN THE MORNING AND ONE IN THE EVENING (Patient taking differently: Take 40 mg by mouth 2 (two) times daily as needed for fluid or edema (in the legs/feet). 40 mg two times a day as needed for edema or fluid) 60 tablet 11 Past Week at Unknown time  . gabapentin (NEURONTIN) 300 MG capsule Take 1 capsule (300 mg total) by mouth 3 (three) times daily. (Patient taking differently: Take 300 mg by mouth daily as needed (for nerve pain in the feet). ) 90 capsule 5 07/18/2017 at pm  . Glucos-Chond-Hyal Ac-Ca Fructo (MOVE FREE JOINT HEALTH ADVANCE) TABS Take 1 tablet by mouth 2 (two) times daily.   Past Week at Unknown time  . insulin regular human CONCENTRATED (HUMULIN R U-500 KWIKPEN) 500  UNIT/ML kwikpen Take 60units at breakfast 50 at lunch and 50 at supper (Patient taking differently: Inject 30-70 Units into the skin See admin instructions. 70 units before breakfast then 30 units before lunch then 30 units at bedtime) 1 pen 12 07/18/2017 at pm  . ketoconazole (NIZORAL) 2 % cream Apply 1 application topically 2 (two) times daily as needed for irritation (between bikini lines).   PRN at PRN  . nitroGLYCERIN (NITROSTAT) 0.4 MG SL tablet Place 1 tablet (0.4 mg total) under the tongue every 5 (five) minutes as needed for chest pain. 50 tablet 5 Past Month at Unknown time  . ondansetron (ZOFRAN) 8 MG tablet Take 1 tablet (8 mg total) by mouth every 8 (eight) hours as needed for nausea or vomiting. 60 tablet 5 PRN at PRN  .  OVER THE COUNTER MEDICATION [employer] caplets for Leg Cramps (Cinchona Off. 3X HPUS; Viscum Alb. 3X HPUS; Gnaphalium Poly. 3X HPUS; Rhus Tox. 6X HPUS; Aconitum Nap. 6X HPUS; Ledum Pal. 6X HPUS; Magnesia Phos. 6X HPUS. Inactive Ingredients: Lactose NF, Microcrystalline Cellulose, and Vegetable Magnesium Stearate): Take 2 caplets by mouth up to four times a day as needed for leg cramps   Past Week at Unknown time  . oxyCODONE-acetaminophen (PERCOCET) 10-325 MG tablet Take 1 tablet by mouth every 6 (six) hours as needed for pain. 120 tablet 0 07/18/2017 at pm  . phenazopyridine (AZO-TABS) 95 MG tablet Take 95 mg by mouth 3 (three) times daily as needed for pain.   PRN at PRN  . pramipexole (MIRAPEX) 1 MG tablet TAKE '5mg'$ =5 TABLETS BY MOUTH daily at bedtime (Patient taking differently: Take 4-5 mg by mouth at bedtime as needed (for restless legs). ) 540 tablet 1 07/18/2017 at pm  . zolpidem (AMBIEN) 10 MG tablet TAKE 1 TABLET BY MOUTH AT BEDTIME (Patient taking differently: Take 10 mg by mouth at bedtime) 30 tablet 5 07/18/2017 at pm  . glucose blood (ACCU-CHEK AVIVA) test strip Test once per day and diagnosis code is E 11.9 100 each 1 Unk at Unk  . hydrocortisone (CORTEF) 5 MG tablet  Take 5 mg by mouth daily. WITH FOOD OR MILK  5 Not yet at Not yet  . losartan (COZAAR) 50 MG tablet Take 1 tablet (50 mg total) by mouth daily. 90 tablet 3 Not yet at Not yet   Scheduled: . clopidogrel  75 mg Oral Daily  . enoxaparin (LOVENOX) injection  40 mg Subcutaneous Q24H  . insulin aspart  0-15 Units Subcutaneous TID WC  . insulin aspart  0-5 Units Subcutaneous QHS  . insulin regular human CONCENTRATED  30 Units Subcutaneous BID  . insulin regular human CONCENTRATED  70 Units Subcutaneous Q breakfast  . losartan  50 mg Oral Daily  . miconazole   Topical BID  . pneumococcal 23 valent vaccine  0.5 mL Intramuscular Tomorrow-1000  . zolpidem  5 mg Oral QHS    ROS: Denies CP, SOB, fever, N/V or abdominal pain. Positive for decreasing leg swelling recently. Positive for headache.   Physical Examination: Blood pressure (!) 134/49, pulse 75, temperature 98.6 F (37 C), temperature source Oral, resp. rate 17, height '5\' 8"'$  (1.727 m), weight 105.8 kg (233 lb 4.8 oz), SpO2 94 %.  HEENT: [place]/AT Lungs: Respirations unlabored Ext: Mild pitting edema lower extremities bilaterally  Neurologic Examination: Mental Status: Alert, oriented, thought content appropriate.  Speech fluent with intact comprehension, repetition and naming. Able to follow all commands without difficulty. Cranial Nerves: II:  Visual fields intact. PERRL.  III,IV, VI: EOMI without nystagmus. No ptosis.  V,VII: Smile symmetric. Facial temp sensation normal bilaterally VIII: Hearing intact to conversation IX,X: Palate rises symmetrically XI: Symmetric XII: midline tongue extension  Motor: RUE: 5/5  LUE: 5/5 Bilateral lower extremities: 5/5  Normal tone throughout; no atrophy noted Sensory: Temp and light touch intact x 4. No extinction Deep Tendon Reflexes:  2+ bilateral upper extremities and patellae. Achilles deferred dur to arthritis pain. Babinski responses deferred due to foot pain.  Cerebellar: No ataxia with  FNF bilaterally Gait: Deferred   Results for orders placed or performed during the hospital encounter of 07/19/17 (from the past 48 hour(s))  Basic metabolic panel     Status: Abnormal   Collection Time: 07/19/17  2:33 PM  Result Value Ref Range   Sodium 132 (  L) 135 - 145 mmol/L   Potassium 4.4 3.5 - 5.1 mmol/L   Chloride 97 (L) 101 - 111 mmol/L   CO2 26 22 - 32 mmol/L   Glucose, Bld 503 (HH) 65 - 99 mg/dL    Comment: CRITICAL RESULT CALLED TO, READ BACK BY AND VERIFIED WITH: C CONNOR,RN [id number] 07/19/17/2018 WBOND    BUN 23 (H) 6 - 20 mg/dL   Creatinine, Ser 0.75 0.44 - 1.00 mg/dL   Calcium 9.0 8.9 - 10.3 mg/dL   GFR calc non Af Amer >60 >60 mL/min   GFR calc Af Amer >60 >60 mL/min    Comment: (NOTE) The eGFR has been calculated using the CKD EPI equation. This calculation has not been validated in all clinical situations. eGFR's persistently <60 mL/min signify possible Chronic Kidney Disease.    Anion gap 9 5 - 15  CBC     Status: None   Collection Time: 07/19/17  2:33 PM  Result Value Ref Range   WBC 6.6 4.0 - 10.5 K/uL   RBC 4.72 3.87 - 5.11 MIL/uL   Hemoglobin 13.6 12.0 - 15.0 g/dL   HCT 41.3 36.0 - 46.0 %   MCV 87.5 78.0 - 100.0 fL   MCH 28.8 26.0 - 34.0 pg   MCHC 32.9 30.0 - 36.0 g/dL   RDW 13.9 11.5 - 15.5 %   Platelets 157 150 - 400 K/uL  CBG monitoring, ED     Status: Abnormal   Collection Time: 07/19/17  2:33 PM  Result Value Ref Range   Glucose-Capillary 476 (H) 65 - 99 mg/dL  Ethanol     Status: None   Collection Time: 07/19/17  2:33 PM  Result Value Ref Range   Alcohol, Ethyl (B) <10 <10 mg/dL    Comment:        LOWEST DETECTABLE LIMIT FOR SERUM ALCOHOL IS 10 mg/dL FOR MEDICAL PURPOSES ONLY   Protime-INR     Status: None   Collection Time: 07/19/17  2:33 PM  Result Value Ref Range   Prothrombin Time 13.5 11.4 - 15.2 seconds   INR 1.04   APTT     Status: None   Collection Time: 07/19/17  2:33 PM  Result Value Ref Range   aPTT 28 24 - 36 seconds   I-Stat Troponin, ED (not at [facility])     Status: None   Collection Time: 07/19/17  2:47 PM  Result Value Ref Range   Troponin i, poc 0.00 0.00 - 0.08 ng/mL   Comment 3            Comment: Due to the release kinetics of cTnI, a negative result within the first hours of the onset of symptoms does not rule out myocardial infarction with certainty. If myocardial infarction is still suspected, repeat the test at appropriate intervals.   Urinalysis, Routine w reflex microscopic     Status: Abnormal   Collection Time: 07/19/17  7:49 PM  Result Value Ref Range   Color, Urine YELLOW YELLOW   APPearance CLEAR CLEAR   Specific Gravity, Urine 1.030 1.005 - 1.030   pH 6.0 5.0 - 8.0   Glucose, UA >=500 (A) NEGATIVE mg/dL   Hgb urine dipstick NEGATIVE NEGATIVE   Bilirubin Urine NEGATIVE NEGATIVE   Ketones, ur NEGATIVE NEGATIVE mg/dL   Protein, ur NEGATIVE NEGATIVE mg/dL   Nitrite NEGATIVE NEGATIVE   Leukocytes, UA SMALL (A) NEGATIVE   RBC / HPF 0-5 0 - 5 RBC/hpf   WBC, UA 0-5 0 -  5 WBC/hpf   Bacteria, UA NONE SEEN NONE SEEN   Squamous Epithelial / LPF 0-5 (A) NONE SEEN   Non Squamous Epithelial 0-5 (A) NONE SEEN  Urine rapid drug screen (hosp performed)not at [facility]     Status: None   Collection Time: 07/19/17  7:49 PM  Result Value Ref Range   Opiates NONE DETECTED NONE DETECTED   Cocaine NONE DETECTED NONE DETECTED   Benzodiazepines NONE DETECTED NONE DETECTED   Amphetamines NONE DETECTED NONE DETECTED   Tetrahydrocannabinol NONE DETECTED NONE DETECTED   Barbiturates NONE DETECTED NONE DETECTED    Comment:        DRUG SCREEN FOR MEDICAL PURPOSES ONLY.  IF CONFIRMATION IS NEEDED FOR ANY PURPOSE, NOTIFY LAB WITHIN 5 DAYS.        LOWEST DETECTABLE LIMITS FOR URINE DRUG SCREEN Drug Class       Cutoff (ng/mL) Amphetamine      1000 Barbiturate      200 Benzodiazepine   939 Tricyclics       030 Opiates          300 Cocaine          300 THC              50   CBG monitoring, ED      Status: Abnormal   Collection Time: 07/19/17  8:50 PM  Result Value Ref Range   Glucose-Capillary 380 (H) 65 - 99 mg/dL  Glucose, capillary     Status: Abnormal   Collection Time: 07/19/17 10:39 PM  Result Value Ref Range   Glucose-Capillary 337 (H) 65 - 99 mg/dL   Comment 1 Notify RN    Comment 2 Document in Chart   MRSA PCR Screening     Status: None   Collection Time: 07/19/17 10:53 PM  Result Value Ref Range   MRSA by PCR NEGATIVE NEGATIVE    Comment:        The GeneXpert MRSA Assay (FDA approved for NASAL specimens only), is one component of a comprehensive MRSA colonization surveillance program. It is not intended to diagnose MRSA infection nor to guide or monitor treatment for MRSA infections.   Hepatic function panel     Status: Abnormal   Collection Time: 07/20/17 12:10 AM  Result Value Ref Range   Total Protein 7.0 6.5 - 8.1 g/dL   Albumin 3.2 (L) 3.5 - 5.0 g/dL   AST 14 (L) 15 - 41 U/L   ALT 14 14 - 54 U/L   Alkaline Phosphatase 84 38 - 126 U/L   Total Bilirubin 0.5 0.3 - 1.2 mg/dL   Bilirubin, Direct 0.1 0.1 - 0.5 mg/dL   Indirect Bilirubin 0.4 0.3 - 0.9 mg/dL  Differential     Status: None   Collection Time: 07/20/17 12:10 AM  Result Value Ref Range   Neutrophils Relative % 52 %   Neutro Abs 2.8 1.7 - 7.7 K/uL   Lymphocytes Relative 37 %   Lymphs Abs 2.0 0.7 - 4.0 K/uL   Monocytes Relative 6 %   Monocytes Absolute 0.3 0.1 - 1.0 K/uL   Eosinophils Relative 5 %   Eosinophils Absolute 0.2 0.0 - 0.7 K/uL   Basophils Relative 0 %   Basophils Absolute 0.0 0.0 - 0.1 K/uL  APTT     Status: None   Collection Time: 07/20/17 12:10 AM  Result Value Ref Range   aPTT 28 24 - 36 seconds  Hemoglobin A1c     Status: Abnormal  Collection Time: 07/20/17 12:10 AM  Result Value Ref Range   Hgb A1c MFr Bld 10.9 (H) 4.8 - 5.6 %    Comment: (NOTE) Pre diabetes:          5.7%-6.4% Diabetes:              >6.4% Glycemic control for   <7.0% adults with diabetes     Mean Plasma Glucose 266.13 mg/dL  Troponin I     Status: None   Collection Time: 07/20/17 12:10 AM  Result Value Ref Range   Troponin I <0.03 <0.03 ng/mL  Troponin I     Status: None   Collection Time: 07/20/17  4:50 AM  Result Value Ref Range   Troponin I <0.03 <0.03 ng/mL  Lipid panel     Status: Abnormal   Collection Time: 07/20/17  4:50 AM  Result Value Ref Range   Cholesterol 109 0 - 200 mg/dL   Triglycerides 136 <150 mg/dL   HDL 40 (L) >40 mg/dL   Total CHOL/HDL Ratio 2.7 RATIO   VLDL 27 0 - 40 mg/dL   LDL Cholesterol 42 0 - 99 mg/dL    Comment:        Total Cholesterol/HDL:CHD Risk Coronary Heart Disease Risk Table                     Men   Women  1/2 Average Risk   3.4   3.3  Average Risk       5.0   4.4  2 X Average Risk   9.6   7.1  3 X Average Risk  23.4   11.0        Use the calculated Patient Ratio above and the CHD Risk Table to determine the patient's CHD Risk.        ATP III CLASSIFICATION (LDL):  <100     mg/dL   Optimal  100-129  mg/dL   Near or Above                    Optimal  130-159  mg/dL   Borderline  160-189  mg/dL   High  >190     mg/dL   Very High   Glucose, capillary     Status: Abnormal   Collection Time: 07/20/17  6:31 AM  Result Value Ref Range   Glucose-Capillary 304 (H) 65 - 99 mg/dL   Comment 1 Notify RN    Comment 2 Document in Chart    Dg Chest 2 View  Result Date: 07/19/2017 CLINICAL DATA:  TIA EXAM: CHEST  2 VIEW COMPARISON:  10/09/2016 FINDINGS: Subsegmental atelectasis at the left base. Borderline to mild cardiomegaly with minimal central congestion. No consolidation or effusion. Aortic atherosclerosis. No pneumothorax. Degenerative changes of the spine. IMPRESSION: 1. Borderline to mild cardiomegaly with mild central congestion. 2. Subsegmental atelectasis at the left base Electronically Signed   By: Donavan Foil M.D.   On: 07/19/2017 23:59   Ct Head Wo Contrast  Result Date: 07/19/2017 CLINICAL DATA:  83 year old female  with progressive weakness for 1 week. EXAM: CT HEAD WITHOUT CONTRAST TECHNIQUE: Contiguous axial images were obtained from the base of the skull through the vertex without intravenous contrast. COMPARISON:  Head CT without contrast 03/30/2017. Brain MRI 10/10/2016, and earlier. FINDINGS: Brain: Cerebral volume remains normal for age. Stable mild for age cerebral white matter hypodensity. No midline shift, ventriculomegaly, mass effect, evidence of mass lesion, intracranial hemorrhage or evidence of cortically based  acute infarction. Vascular: Calcified atherosclerosis at the skull base. No suspicious intracranial vascular hyperdensity. Skull: No acute osseous abnormality identified. Sinuses/Orbits: Visualized paranasal sinuses and mastoids are stable and well pneumatized. Other: No acute orbit or scalp soft tissue findings. IMPRESSION: 1.  No acute intracranial abnormality. 2. Stable non contrast CT appearance of the brain since June, with mild for age chronic white matter changes. Electronically Signed   By: Genevie Ann M.D.   On: 07/19/2017 16:21   Mr Brain Wo Contrast  Result Date: 07/20/2017 CLINICAL DATA:  83 y/o F; weakness, slurred speech, word-finding difficulty. Mild headache. EXAM: MRI HEAD WITHOUT CONTRAST MRA HEAD WITHOUT CONTRAST TECHNIQUE: Multiplanar, multiecho pulse sequences of the brain and surrounding structures were obtained without intravenous contrast. Angiographic images of the head were obtained using MRA technique without contrast. COMPARISON:  07/19/2017 CT of the head.  10/10/2016 MRI of the head. FINDINGS: MRI HEAD FINDINGS Brain: Subcentimeter foci of reduced diffusion compatible with acute/early subacute infarction are present within the left inferomedial and superolateral cerebellar hemisphere, left ventrolateral thalamus, and scattered foci are present left parietal subcortical white matter. (Series 3: Image 12, 16, 25, 31, 35). Foci of acute/early subacute infarction demonstrate  associated T2 FLAIR hyperintense signal abnormality. No significant mass effect. No abnormal susceptibility hypointensity to indicate intracranial hemorrhage. Stable background of scattered nonspecific foci of T2 FLAIR hyperintensity in subcortical and periventricular white matter is compatible with mild chronic microvascular ischemic changes for age and there is mild brain parenchymal volume loss. No hydrocephalus, extra-axial collection, effacement of basilar cisterns, or herniation. Vascular: As below. Skull and upper cervical spine: Normal marrow signal. Sinuses/Orbits: Mild anterior ethmoid sinus mucosal thickening. Otherwise negative. Other: Bilateral intra-ocular lens replacement. MRA HEAD FINDINGS Internal carotid arteries:  Patent. Anterior cerebral arteries:  Patent. Middle cerebral arteries: Patent. Anterior communicating artery: Patent. Posterior communicating arteries: Patent left. No right identified, likely hypoplastic or absent. Posterior cerebral arteries:  Patent. Basilar artery:  Patent. Vertebral arteries:  Patent. No evidence of high-grade stenosis, large vessel occlusion, or aneurysm identified. IMPRESSION: 1. Several subcentimeter foci of acute/early subacute infarction are present within left cerebellar hemisphere, left thalamus, and left parietal subcortical white matter. No associated mass effect or hemorrhage. Multiple vascular distributions favors embolic etiology. 2. Stable background of mild chronic microvascular ischemic changes and parenchymal volume loss of the brain. 3. Patent circle of Willis. No high-grade stenosis, large vessel occlusion, or aneurysm identified. These results will be called to the ordering clinician or representative by the Radiologist Assistant, and communication documented in the PACS or zVision Dashboard. Electronically Signed   By: Kristine Garbe M.D.   On: 07/20/2017 00:08   [name] Head Wo Contrast  Result Date: 07/20/2017 CLINICAL DATA:  83  y/o F; weakness, slurred speech, word-finding difficulty. Mild headache. EXAM: MRI HEAD WITHOUT CONTRAST MRA HEAD WITHOUT CONTRAST TECHNIQUE: Multiplanar, multiecho pulse sequences of the brain and surrounding structures were obtained without intravenous contrast. Angiographic images of the head were obtained using MRA technique without contrast. COMPARISON:  07/19/2017 CT of the head.  10/10/2016 MRI of the head. FINDINGS: MRI HEAD FINDINGS Brain: Subcentimeter foci of reduced diffusion compatible with acute/early subacute infarction are present within the left inferomedial and superolateral cerebellar hemisphere, left ventrolateral thalamus, and scattered foci are present left parietal subcortical white matter. (Series 3: Image 12, 16, 25, 31, 35). Foci of acute/early subacute infarction demonstrate associated T2 FLAIR hyperintense signal abnormality. No significant mass effect. No abnormal susceptibility hypointensity to indicate intracranial hemorrhage. Stable background  of scattered nonspecific foci of T2 FLAIR hyperintensity in subcortical and periventricular white matter is compatible with mild chronic microvascular ischemic changes for age and there is mild brain parenchymal volume loss. No hydrocephalus, extra-axial collection, effacement of basilar cisterns, or herniation. Vascular: As below. Skull and upper cervical spine: Normal marrow signal. Sinuses/Orbits: Mild anterior ethmoid sinus mucosal thickening. Otherwise negative. Other: Bilateral intra-ocular lens replacement. MRA HEAD FINDINGS Internal carotid arteries:  Patent. Anterior cerebral arteries:  Patent. Middle cerebral arteries: Patent. Anterior communicating artery: Patent. Posterior communicating arteries: Patent left. No right identified, likely hypoplastic or absent. Posterior cerebral arteries:  Patent. Basilar artery:  Patent. Vertebral arteries:  Patent. No evidence of high-grade stenosis, large vessel occlusion, or aneurysm identified.  IMPRESSION: 1. Several subcentimeter foci of acute/early subacute infarction are present within left cerebellar hemisphere, left thalamus, and left parietal subcortical white matter. No associated mass effect or hemorrhage. Multiple vascular distributions favors embolic etiology. 2. Stable background of mild chronic microvascular ischemic changes and parenchymal volume loss of the brain. 3. Patent circle of Willis. No high-grade stenosis, large vessel occlusion, or aneurysm identified. These results will be called to the ordering clinician or representative by the Radiologist Assistant, and communication documented in the PACS or zVision Dashboard. Electronically Signed   By: Kristine Garbe M.D.   On: 07/20/2017 00:08    Assessment: 83 y.o. female presenting with aphasia. MRI reveals multifocal acute to subacute ischemic infarctions concerning for embolic phenomenon 1. Neurological exam is nonfocal. No aphasia noted.  2. MRI brain: Several subcentimeter foci of acute/early subacute infarction are present within left cerebellar hemisphere, left thalamus, and left parietal subcortical white matter. No associated mass effect or hemorrhage. Multiple vascular distributions favors embolic etiology. Stable background of mild chronic microvascular ischemic changes and parenchymal volume loss of the brain.  3. MRA head: Patent circle of Willis. No high-grade stenosis, large vessel occlusion, or aneurysm identified.  4. EKG shows no atrial fibrillation 5. Stroke Risk Factors - prior TIAs, PVD, HTN, HLD, DM and CAD  Plan: 1. Agree with starting Plavix. May need to switch to anticoagulation pending results of stroke work up. 2. HgbA1c, fasting lipid panel 3. Cardiac telemetry to assess for possible intermittent atrial fibrillation 4. TTE. If negative obtain TEE.  5. Carotid dopplers 6. Given advanced age, benefits of statin may be outweighed by risks 7. Frequent neuro checks 8. PT consult, OT consult,  Speech consult  '@Electronically'$  signed: Dr. Kerney Elbe  07/20/2017, 6:57 AM

## 2017-07-20 NOTE — Progress Notes (Signed)
Hypoglycemic Event  CBG: 49  Treatment:8 oz of orange juice  Symptoms:asymptomatic  Follow-up CBG: Time: 2144 CBG Result:72  Possible Reasons for Event: Poor intake  Comments/MD notified: AMION MD no response at this time    Azell Der

## 2017-07-20 NOTE — Progress Notes (Signed)
Occupational Therapy Evaluation Patient Details Name: Melanie Mcdonald MRN: [id number] DOB: 03/18/1933 Today's Date: 07/20/2017    History of Present Illness Pt is a 83 y.o. female presented to the ED with confusion, speech difficulties, and weakness. PMH inclusive of TIA, PVD, narcolepsy, HTN, HLD, DM, depression, CAD, COPD, and chronic pain. MRI shows several subcentimeter foci of acute/early subacute infarcts present within left cerebellar hemisphere, left thalamus, and left parietal subcortical white matter.   Clinical Impression   PTA, pt lived with son and required min A with LB ADL and mobilized @ RW level. Pt presents with generalized weakness and currently requires min A with mobility and mod A with LB ADL. Feel pt will benefit from rehab at SNF to maximize functional level of independence to facilitate safe DC home. Will follow acutely to facilitate DC to next venue of care and address established goals.     Follow Up Recommendations  SNF;Supervision/Assistance - 24 hour    Equipment Recommendations  None recommended by OT    Recommendations for Other Services       Precautions / Restrictions Precautions Precautions: Fall Restrictions Weight Bearing Restrictions: No      Mobility Bed Mobility Overal bed mobility: Needs Assistance Bed Mobility: Supine to Sit;Sit to Supine     Supine to sit: Supervision Sit to supine: Supervision   General bed mobility comments:Transfers Overall transfer level: Needs assistance Equipment used: Rolling walker (2 wheeled) Transfers: Sit to/from Stand Sit to Stand: Min assist         General transfer comment: pt required min assist to power up to standing    Balance Overall balance assessment: Needs assistance;History of Falls Sitting-balance support: Feet supported;No upper extremity supported Sitting balance-Leahy Scale: Fair     Standing balance support: During functional activity;Bilateral upper extremity  supported Standing balance-Leahy Scale: Poor Standing balance comment: relies on RW for support                           ADL either performed or assessed with clinical judgement   ADL Overall ADL's : Needs assistance/impaired Eating/Feeding: Independent   Grooming: Set up   Upper Body Bathing: Set up;Sitting   Lower Body Bathing: Sit to/from stand;Moderate assistance   Upper Body Dressing : Supervision/safety;Set up;Sitting   Lower Body Dressing: Sit to/from stand;Moderate assistance   Toilet Transfer: Minimal assistance;RW;Stand-pivot   Toileting- Clothing Manipulation and Hygiene: Minimal assistance;Sit to/from stand       Functional mobility during ADLs: Minimal assistance;Rolling walker;Cueing for safety       Vision   Additional Comments: no apparent deficits. will further assess     Perception Perception Comments: appears intact during functional tasks   Praxis Praxis Praxis tested?: Within functional limits    Pertinent Vitals/Pain Pain Assessment: No/denies pain     Hand Dominance Right   Extremity/Trunk Assessment Upper Extremity Assessment Upper Extremity Assessment: Generalized weakness   Lower Extremity Assessment Lower Extremity Assessment: Defer to PT evaluation Cervical / Trunk Assessment Cervical / Trunk Assessment: Normal   Communication Communication Communication: No difficulties   Cognition Arousal/Alertness: Awake/alert Behavior During Therapy: WFL for tasks assessed/performed Overall Cognitive Status: No family/caregiver present to determine baseline cognitive functioning Area of Impairment: Attention;Following commands;Awareness;Problem solving                   Current Attention Level: Selective   Following Commands: Follows multi-step commands consistently   Awareness: Emergent Problem Solving: Requires verbal cues;Requires tactile  cues General Comments: slow processing   General Comments        Exercises Exercises: Other exercises Other Exercises Other Exercises: instructed on general UE exercises at bed level   Shoulder Instructions      Home Living Family/patient expects to be discharged to:: Private residence Living Arrangements: Children Available Help at Discharge: Family;Available PRN/intermittently Type of Home: House Home Access: Stairs to enter [employer] of Steps: 8 (2 sets of stairs; ramp available) Entrance Stairs-Rails: Right;Left Home Layout: One level     Bathroom Shower/Tub: Occupational psychologist: Standard Bathroom Accessibility: Yes   Home Equipment: Environmental consultant - 4 wheels;Bedside commode;Shower seat;Grab bars - toilet   Additional Comments: Pt reports that son is living with her during week but works during the day and returns home to [place] on weekends.       Prior Functioning/Environment Level of Independence: Needs assistance  Gait / Transfers Assistance Needed: Uses walker for all mobility ADL's / Homemaking Assistance Needed: Pt states that she is able to bathe and dress herself but her son assists with socks/shoes due to being unable to reach   Comments: Pt has L foot wound        OT Problem List: Decreased strength;Decreased activity tolerance;Impaired balance (sitting and/or standing);Obesity      OT Treatment/Interventions: Self-care/ADL training;Therapeutic exercise;DME and/or AE instruction;Therapeutic activities;Patient/family education;Balance training    OT Goals(Current goals can be found in the care plan section) Acute Rehab OT Goals Patient Stated Goal: to go home OT Goal Formulation: With patient Time For Goal Achievement: 08/03/17 Potential to Achieve Goals: Good  OT Frequency: Min 2X/week   Barriers to D/C:            Co-evaluation              AM-PAC PT "6 Clicks" Daily Activity     Outcome Measure Help from another person eating meals?: None Help from another person taking care of  personal grooming?: None Help from another person toileting, which includes using toliet, bedpan, or urinal?: A Little Help from another person bathing (including washing, rinsing, drying)?: A Lot Help from another person to put on and taking off regular upper body clothing?: None Help from another person to put on and taking off regular lower body clothing?: A Lot 6 Click Score: 19   End of Session Equipment Utilized During Treatment: Gait belt;Rolling walker Nurse Communication: Mobility status  Activity Tolerance: Patient tolerated treatment well Patient left: in bed;with call bell/phone within reach;with bed alarm set;with family/visitor present  OT Visit Diagnosis: Unsteadiness on feet (R26.81);Muscle weakness (generalized) (M62.81)                Time: 1125-1140 OT Time Calculation (min): 15 min Charges:  OT General Charges $OT Visit: 1 Visit OT Evaluation $OT Eval Low Complexity: 1 Low G-Codes: OT G-codes **NOT FOR INPATIENT CLASS** Functional Assessment Tool Used: Clinical judgement Functional Limitation: Self care Self Care Current Status (U2025): At least 20 percent but less than 40 percent impaired, limited or restricted Self Care Goal Status (K2706): At least 1 percent but less than 20 percent impaired, limited or restricted   [facility], OT/L  [phone] 07/20/2017  Enrrique Mierzwa,HILLARY 07/20/2017, 2:19 PM

## 2017-07-20 NOTE — Progress Notes (Signed)
Patient contacted [facility] to obtain name and dose of medication she takes for restless leg syndrome, Pramipexole 1mg  and she says that she may take 4-6 mg over the course of a night other wise she is unable to sleep. Will contact the on call MD and ask them to write for this Med if appropriate.
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
## 2017-07-20 NOTE — Progress Notes (Signed)
Patient states that she takes antibiotic at home. Her son brought a bottle of a recent prescription filled at [facility] a few days ago-- MD notified.  Per verbal order  Cephalexin  PO 500mg  X3/daily ordered for patient.

## 2017-07-20 NOTE — Progress Notes (Signed)
Patient is off the floor.

## 2017-07-20 NOTE — Progress Notes (Signed)
    [facility] has been requested to perform a transesophageal echocardiogram on Melanie Mcdonald for stroke.  After careful review of history and examination, the risks and benefits of transesophageal echocardiogram have been explained including risks of esophageal damage, perforation (1:10,000 risk), bleeding, pharyngeal hematoma as well as other potential complications associated with conscious sedation including aspiration, arrhythmia, respiratory failure and death. Alternatives to treatment were discussed, questions were answered. Patient is willing to proceed.  TEE - Dr. Meda Coffee 07/23/17 @ noon . NPO after midnight. Meds with sips.   Barabara Motz, PA-C 07/20/2017 4:59 PM

## 2017-07-20 NOTE — Progress Notes (Signed)
Nutrition Brief Note  Patient identified on the Malnutrition Screening Tool (MST) Report.   Pt currently in CT-IMAGING.   Wt Readings from Last 15 Encounters:  07/19/17 233 lb 4.8 oz (105.8 kg)  06/26/17 242 lb (109.8 kg)  04/02/17 234 lb (106.1 kg)  02/09/17 251 lb (113.9 kg)  01/16/17 248 lb (112.5 kg)  01/16/17 250 lb (113.4 kg)  12/22/16 237 lb (107.5 kg)  10/09/16 237 lb 3.4 oz (107.6 kg)  06/28/16 240 lb (108.9 kg)  05/02/16 248 lb (112.5 kg)  02/08/16 242 lb 12.8 oz (110.1 kg)  08/12/15 266 lb (120.7 kg)  06/14/15 253 lb (114.8 kg)  05/24/15 245 lb (111.1 kg)  05/20/15 245 lb (111.1 kg)   Body mass index is 35.47 kg/m. Patient meets criteria for Obesity Class II based on current BMI.   Current diet order is NPO. Labs and medications reviewed. CBG's [phone].  No nutrition interventions warranted at this time. If nutrition issues arise, please consult RD.   Arthur Holms, RD, LDN Pager #: [phone] After-Hours Pager #: [phone]

## 2017-07-20 NOTE — Evaluation (Signed)
Speech Language Pathology Evaluation Patient Details Name: Melanie Mcdonald MRN: [id number] DOB: 11-Oct-1932 Today's Date: 07/20/2017 Time: 8338-2505 SLP Time Calculation (min) (ACUTE ONLY): 12 min  Problem List:  Patient Active Problem List   Diagnosis Date Noted  . CVA (cerebral vascular accident) ([place]) 07/20/2017  . Candidiasis of genitalia in female 07/20/2017  . Weakness 07/19/2017  . Uncontrolled type 2 diabetes mellitus with hyperglycemia, with long-term current use of insulin ([place]) 10/09/2016  . Dysarthria 10/09/2016  . Cellulitis of right foot 06/13/2016  . Gout 06/07/2016  . Leg swelling 04/05/2016  . Pain in joint, ankle and foot 12/24/2015  . Diabetic polyneuropathy associated with diabetes mellitus due to underlying condition ([place]) 06/14/2015  . Cervicogenic headache 06/14/2015  . Weakness generalized 11/13/2014  . TIA (transient ischemic attack) 11/13/2014  . Gait instability 11/13/2014  . Acute on chronic diastolic CHF (congestive heart failure), NYHA class 3 ([place]) 11/03/2014  . Obstructive apnea 11/03/2014  . DOE (dyspnea on exertion) 11/03/2014  . Thyroid nodule 04/28/2014  . Personal history of colonic polyps 03/09/2014  . Abdominal pain, unspecified site 03/09/2014  . Dizziness 02/10/2014  . Memory loss 12/30/2013  . Headache 12/30/2013  . Abdominal pain, chronic, right lower quadrant 06/03/2013  . Hypokalemia 08/15/2011  . Diarrhea 08/15/2011  . Cellulitis of left leg 08/14/2011  . Chest pain 06/08/2011  . HIP PAIN 09/15/2010  . CYSTOCELE WITHOUT MENTION UTERINE PROLAPSE LAT 09/12/2010  . Labyrinthitis 11/03/2009  . Transient cerebral ischemia 10/20/2009  . NEPHROLITHIASIS 04/29/2009  . Peripheral vascular disease ([place]) 01/26/2009  . IRRITABLE BOWEL SYNDROME 01/26/2009  . Edema 09/02/2008  . Type 2 diabetes mellitus ([place]) 08/26/2008  . Hyperlipidemia 08/26/2008  . Narcolepsy without cataplexy 08/26/2008  . HYPOGLYCEMIA 06/23/2008  . Backache  06/23/2008  . Arthropathy 12/11/2007  . Insomnia 12/11/2007  . Esophageal reflux 11/06/2007  . Osteoarthritis 08/07/2007  . OBESITY 03/01/2007  . Depression, recurrent ([place]) 03/01/2007  . SYNDROME, RESTLESS LEGS 03/01/2007  . Essential hypertension 03/01/2007  . Coronary atherosclerosis 03/01/2007   Past Medical History:  Past Medical History:  Diagnosis Date  . ANEMIA 01/30/2008  . ARTHRITIS 12/11/2007  . BACK PAIN, CHRONIC 06/23/2008  . BREAST CYST, RIGHT 12/17/2007  . Colon polyps    FRAGMENTS OF HYPERPLASTIC POLYP  . CONTACT DERMATITIS 03/10/2009  . COPD (chronic obstructive pulmonary disease) ([place])   . CORONARY ARTERY DISEASE 03/01/2007   had a normal Myoview stress test 07-13-11  . CYSTOCELE WITHOUT MENTION UTERINE PROLAPSE LAT 09/12/2010  . DEGENERATIVE JOINT DISEASE 08/07/2007  . DEPRESSION 03/01/2007  . DIABETES MELLITUS, TYPE II 08/26/2008   sees Dr. Buddy Duty   . Diverticulosis of colon (without mention of hemorrhage)   . Esophageal reflux 11/06/2007  . HYPERKERATOSIS 06/02/2009  . HYPERLIPIDEMIA 08/26/2008  . HYPERTENSION 03/01/2007  . Irritable bowel syndrome 01/26/2009  . LABYRINTHITIS 11/03/2009  . 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 NEPHROLITHIASIS 04/29/2009  . OBESITY 03/01/2007  . PERIPHERAL VASCULAR DISEASE 01/26/2009  . SYNCOPE 08/26/2008   had brain MRI on 06-28-12 showing only chronic microvascular ischemia and atrophy   . TRANSIENT ISCHEMIC ATTACK 10/20/2009   had normal brain MRA with patent vertebrals and carotids 06-28-12   Past Surgical History:  Past Surgical History:  Procedure Laterality Date  . ABDOMINAL HYSTERECTOMY    . BACK SURGERY     x2  . CARDIAC CATHETERIZATION  07/2009  . CATARACT EXTRACTION    . COLONOSCOPY  11-13-07   per Dr. Deatra Ina, benign polyps, repeat in 5 yrs  .  ESOPHAGOGASTRODUODENOSCOPY  11-13-07   per Dr. Deatra Ina, normal   . INTRAOCULAR LENS INSERTION    . SPINE SURGERY     x 3  . TONSILECTOMY, ADENOIDECTOMY, BILATERAL  MYRINGOTOMY AND TUBES     HPI:  83 y.o.femalewith medical history significant of TIA; PVD; narcolepsy; HTN; HLD; DM; depression; CAD; COPD; and chronic pain admitted with aphasia.  MRI: MRI brain: Several subcentimeter foci of acute/early subacute infarction are present within left cerebellar hemisphere, left thalamus, and left parietal subcortical white matter. No associated mass effect or hemorrhage   Assessment / Plan / Recommendation Clinical Impression  Pt presents with normal language - output is fluent without dysarthria.  Pt is following commands, asking appropriate questions about her dx and her care.  No naming deficits.  No SLP f/u is warranted - our services will sign off.     SLP Assessment  SLP Recommendation/Assessment: Patient does not need any further Speech Lanaguage Pathology Services SLP Visit Diagnosis: Cognitive communication deficit (R41.841)    Follow Up Recommendations  None    Frequency and Duration           SLP Evaluation Cognition  Overall Cognitive Status: Within Functional Limits for tasks assessed Arousal/Alertness: Awake/alert Orientation Level: Oriented X4       Comprehension  Auditory Comprehension Overall Auditory Comprehension: Appears within functional limits for tasks assessed Yes/No Questions: Within Functional Limits Commands: Within Functional Limits Reading Comprehension Reading Status: Not tested    Expression Expression Primary Mode of Expression: Verbal Verbal Expression Overall Verbal Expression: Appears within functional limits for tasks assessed Initiation: No impairment Level of Generative/Spontaneous Verbalization: Conversation Naming: No impairment Written Expression Dominant Hand: Right   Oral / Motor  Oral Motor/Sensory Function Overall Oral Motor/Sensory Function: Within functional limits Motor Speech Overall Motor Speech: Appears within functional limits for tasks assessed   GO          Functional Assessment  Tool Used: clinical judgment Functional Limitations: Spoken language comprehension Spoken Language Comprehension Current Status (W9798): 0 percent impaired, limited or restricted Spoken Language Comprehension Goal Status (X2119): 0 percent impaired, limited or restricted Spoken Language Comprehension Discharge Status [phone]): 0 percent impaired, limited or restricted         Juan Quam Laurice 07/20/2017, 12:01 PM

## 2017-07-20 NOTE — Care Management Obs Status (Signed)
[place] NOTIFICATION   Patient Details  Name: ELANE PEABODY MRN: [id number] Date of Birth: Apr 27, 1933   Medicare Observation Status Notification Given:  Yes    Carles Collet, RN 07/20/2017, 3:51 PM

## 2017-07-20 NOTE — Progress Notes (Signed)
Physical Therapy Evaluation Patient Details Name: Melanie Mcdonald MRN: [id number] DOB: [birth year] Today's Date: 07/20/2017   History of Present Illness  Pt is a 83 y.o. female presented to the ED with confusion, speech difficulties, and weakness. PMH inclusive of TIA, PVD, narcolepsy, HTN, HLD, DM, depression, CAD, COPD, and chronic pain. MRI shows several subcentimeter foci of acute/early subacute infarcts present within left cerebellar hemisphere, left thalamus, and left parietal subcortical white matter.  Clinical Impression  Patient presents with the above. Evaluated patient and noted findings below (See PT Problem List). At this time feel patient is unsafe to d/c to home alone at this time. Recommending SNF to address impairments and improve functional mobility for safe return to home. PT will continue to follow acutely and progress as tolerated.     Follow Up Recommendations SNF;Supervision/Assistance - 24 hour    Equipment Recommendations   (TBD)    Recommendations for Other Services       Precautions / Restrictions Precautions Precautions: Fall Restrictions Weight Bearing Restrictions: No      Mobility  Bed Mobility Overal bed mobility: Needs Assistance Bed Mobility: Supine to Sit;Sit to Supine     Supine to sit: Supervision Sit to supine: Supervision   General bed mobility comments: with HOB elevated ~35 deg, pt able to move EOB. requires increased time and effort. supervision for safe management of IV lines. use of bed rails but otherwise no physical assist required  Transfers Overall transfer level: Needs assistance Equipment used: Rolling walker (2 wheeled) Transfers: Sit to/from Stand Sit to Stand: Min assist         General transfer comment: pt required min assist to power up to standing  Ambulation/Gait Ambulation/Gait assistance: Min guard Ambulation Distance (Feet): 25 Feet Assistive device: Rolling walker (2 wheeled) Gait Pattern/deviations:  Step-through pattern;Decreased step length - right;Decreased step length - left;Decreased stride length;Decreased dorsiflexion - right;Decreased dorsiflexion - left;Narrow base of support;Trunk flexed Gait velocity: decreased Gait velocity interpretation: <1.8 ft/sec, indicative of risk for recurrent falls General Gait Details: slow and cautious with instability demonstrated but no significant LOB noted. Pt reports this is slower than normal for her the past ~3 weeks  Stairs            Wheelchair Mobility    Modified Rankin (Stroke Patients Only) Modified Rankin (Stroke Patients Only) Pre-Morbid Rankin Score: No significant disability Modified Rankin: Moderate disability     Balance Overall balance assessment: Needs assistance Sitting-balance support: Feet supported;No upper extremity supported Sitting balance-Leahy Scale: Fair     Standing balance support: During functional activity;Bilateral upper extremity supported Standing balance-Leahy Scale: Poor Standing balance comment: relies on RW for support                             Pertinent Vitals/Pain Pain Assessment: No/denies pain    Home Living Family/patient expects to be discharged to:: Private residence Living Arrangements: Children Available Help at Discharge: Family;Available PRN/intermittently Type of Home: House Home Access: Stairs to enter Entrance Stairs-Rails: Psychiatric nurse of Steps: 8 (2 sets of stairs; ramp available) Home Layout: One level Home Equipment: Walker - 4 wheels;Bedside commode;Shower seat;Grab bars - toilet Additional Comments: Pt reports that son is living with her during week but works during the day and returns home to [place] on weekends.     Prior Function Level of Independence: Needs assistance   Gait / Transfers Assistance Needed: Uses walker for all mobility  ADL's /  Homemaking Assistance Needed: Pt states that she is able to bathe and dress  herself, needing assistance from her son to get around        Hand Dominance   Dominant Hand: Right    Extremity/Trunk Assessment   Upper Extremity Assessment Upper Extremity Assessment: Defer to OT evaluation    Lower Extremity Assessment Lower Extremity Assessment: RLE deficits/detail;LLE deficits/detail RLE Deficits / Details: DF/PF 3+/5. Sensation impaired at dorsum of foot LLE Deficits / Details: DF/PF 3/5. Sensation impaired over dorsum of foot.       Communication   Communication: No difficulties  Cognition Arousal/Alertness: Awake/alert Behavior During Therapy: WFL for tasks assessed/performed Overall Cognitive Status: Impaired/Different from baseline Area of Impairment: Attention;Following commands;Awareness;Problem solving                   Current Attention Level: Selective   Following Commands: Follows multi-step commands consistently   Awareness: Emergent Problem Solving: Requires verbal cues;Requires tactile cues        General Comments      Exercises     Assessment/Plan    PT Assessment Patient needs continued PT services  PT Problem List Decreased strength;Decreased range of motion;Decreased activity tolerance;Decreased balance;Decreased mobility;Decreased coordination;Decreased knowledge of use of DME;Decreased safety awareness       PT Treatment Interventions DME instruction;Gait training;Stair training;Functional mobility training;Therapeutic activities;Therapeutic exercise;Balance training;Neuromuscular re-education;Patient/family education    PT Goals (Current goals can be found in the Care Plan section)  Acute Rehab PT Goals Patient Stated Goal: to go home PT Goal Formulation: With patient Time For Goal Achievement: 08/03/17 Potential to Achieve Goals: Good    Frequency Min 3X/week   Barriers to discharge        Co-evaluation               AM-PAC PT "6 Clicks" Daily Activity  Outcome Measure Difficulty turning  over in bed (including adjusting bedclothes, sheets and blankets)?: A Little Difficulty moving from lying on back to sitting on the side of the bed? : A Little Difficulty sitting down on and standing up from a chair with arms (e.g., wheelchair, bedside commode, etc,.)?: A Lot Help needed moving to and from a bed to chair (including a wheelchair)?: A Lot Help needed walking in hospital room?: A Lot Help needed climbing 3-5 steps with a railing? : Total 6 Click Score: 13    End of Session Equipment Utilized During Treatment: Gait belt Activity Tolerance: Patient limited by fatigue Patient left: in bed;with bed alarm set;with call bell/phone within reach Nurse Communication: Mobility status PT Visit Diagnosis: Unsteadiness on feet (R26.81);Muscle weakness (generalized) (M62.81);Difficulty in walking, not elsewhere classified (R26.2)    Time: 5093-2671 PT Time Calculation (min) (ACUTE ONLY): 26 min   Charges:   PT Evaluation $PT Eval Moderate Complexity: 1 Mod     PT G Codes:   PT G-Codes **NOT FOR INPATIENT CLASS** Functional Assessment Tool Used: Clinical judgement Functional Limitation: Mobility: Walking and moving around Mobility: Walking and Moving Around Current Status (I4580): At least 20 percent but less than 40 percent impaired, limited or restricted Mobility: Walking and Moving Around Goal Status [phone]): At least 1 percent but less than 20 percent impaired, limited or restricted    A. Quintella Reichert, SPT [phone] office   Margarita Grizzle 07/20/2017, 11:37 AM

## 2017-07-20 NOTE — Progress Notes (Signed)
The radiologist called and wanted me to make the MD aware that results of MRI were in the computer. Paged Dr Lorin Mercy and gave her the message.

## 2017-07-20 NOTE — Progress Notes (Signed)
PROGRESS NOTE    Melanie Mcdonald  XTG:[id number] DOB: September 06, 1933 DOA: 07/19/2017 PCP: Laurey Morale, MD   Chief Complaint  Patient presents with  . Weakness    Brief Narrative:  HPI on 07/19/2017 by Dr. [name] CHENAE BRAGER is a 83 y.o. female with medical history significant of TIA; PVD; narcolepsy; HTN; HLD; DM; depression; CAD; COPD; and chronic pain presenting due to "Strokish".  Her son noticed that she was different since Saturday.  She gets confused and can't talk.  She knows what she wants to say but can't get the words out.  Her symptoms are intermittent, but she has been getting chronically weaker the whole time. She had a mini-stroke 4-5 times in the last 3-4 years.  She had one Saturday and it just keeps coming back.  Most times she relaxes and calms down and within 3-4 hours it goes away but this time it kept coming back.  She has a broken rib and this is causing her pain on the left.  She has a vaginal yeast infection from antibiotics.  She has an abscess on her foot that they have been treating with antibiotics; this is getting better.  No dysphagia, just expressive apahsia.  Generalized weakness.  +cough, nonproductive.  +SOB, no wheezing.  Slight diarrhea - 1 loose stool yesterday; they think this is from the antibiotics. Assessment & Plan   Acute CVA -CT head showed no acute abnormality -MRI/MRA brain: Several subcentimeter foci of acute/early subacute infarction within left cerebellar hemisphere, left thalamus, left parietal subcortical white matter. Patent circle of Willis, no high-grade stenosis, large vessel occlusion or aneurysm identified -CTA neck: Known posterior circulation infarcts with no flow-limiting stenosis or visible embolic source. -Neurology consulted and appreciated; discussed with Dr. Jaynee Eagles, recommended TEE -Hemoglobin A1c 10.9, LDL 42 -Cardiology notified of TEE -Echocardiogram pending -PT recommended SNF -Continue Plavix  Essential  Hypertension -Given acute CVA, left vermis of hypertension -Cozaar held, may restart in 48 hours -Continue to monitor closely, will give hydralazine IV as needed for BP > 220/120  Hyperlipidemia -Lipid panel: Total cholesterol 109, HDL 40, LDL 42, triglycerides 136 -Currently on no cholesterol medications  Diabetes mellitus, type II -Poorly controlled, hemoglobin A1c 10.9 -refuses insulin sliding scale -Continue home regimen  Depression/narcolepsy -Dextroamphetamine held  Candidiasis -Was given 1 dose of Diflucan IV, continue topical Monistat  Diabetic foot ulcer, left -Being followed by a podiatrist, patient states she's been on antibiotics for the past several months, however cannot remove the name of the antibiotic. -Have asked for patient to family bring and updated list of her medications. -no drainage noted  Mediastinal adenopathy -Noted on CTA neck, appears to be increased as compared to CT chest done in 2015 -will need outpatient follow up  Restless leg syndrome -Continue home dose of mirapex, discussed with pharmacy -continue gabapentin   DVT Prophylaxis  lovenox  Code Status: DO NOT RESUSCITATE  Family Communication: None at bedside  Disposition Plan: observation, pending workup and TEE  Consultants Neurology Cardiology  Procedures  None  Antibiotics   Anti-infectives    Start     Dose/Rate Route Frequency Ordered Stop   07/20/17 0230  fluconazole (DIFLUCAN) IVPB 200 mg     200 mg 100 mL/hr over 60 Minutes Intravenous  Once 07/20/17 0159 07/20/17 2703      Subjective:   Dariann Huckaba seen and examined today.  Worries about her home medications. Denies chest pain, shortness of breath, abdominal pain, nausea, vomiting, diarrhea, constipation.  Objective:   Vitals:   07/20/17 0023 07/20/17 0202 07/20/17 0402 07/20/17 1036  BP: 140/82 (!) 144/63 (!) 134/49 137/61  Pulse: 78 73 75 73  Resp: 18 16 17 20   Temp: 98.4 F (36.9 C) 98.6 F (37 C)  98.6 F (37 C) 98 F (36.7 C)  TempSrc: Oral Oral Oral Oral  SpO2: 98% 95% 94% 92%  Weight:      Height:       No intake or output data in the 24 hours ending 07/20/17 1314 Filed Weights   07/19/17 2223  Weight: 105.8 kg (233 lb 4.8 oz)    Exam  General: Well developed, well nourished, NAD, appears stated age  HEENT: NCAT, mucous membranes moist.   Cardiovascular: S1 S2 auscultated, RRR, no murmur  Respiratory: Clear to auscultation bilaterally with equal chest rise  Abdomen: Soft, nontender, nondistended, + bowel sounds  Extremities: warm dry without cyanosis clubbing or edema  Neuro: AAOx3, nonfocal  Psych: Normal affect and demeanor, pleasant    Data Reviewed: I have personally reviewed following labs and imaging studies  CBC:  Recent Labs Lab 07/19/17 1433 07/20/17 0010  WBC 6.6  --   NEUTROABS  --  2.8  HGB 13.6  --   HCT 41.3  --   MCV 87.5  --   PLT 157  --    Basic Metabolic Panel:  Recent Labs Lab 07/19/17 1433  NA 132*  K 4.4  CL 97*  CO2 26  GLUCOSE 503*  BUN 23*  CREATININE 0.75  CALCIUM 9.0   GFR: Estimated Creatinine Clearance: 70.3 mL/min (by C-G formula based on SCr of 0.75 mg/dL). Liver Function Tests:  Recent Labs Lab 07/20/17 0010  AST 14*  ALT 14  ALKPHOS 84  BILITOT 0.5  PROT 7.0  ALBUMIN 3.2*   No results for input(s): LIPASE, AMYLASE in the last 168 hours. No results for input(s): AMMONIA in the last 168 hours. Coagulation Profile:  Recent Labs Lab 07/19/17 1433  INR 1.04   Cardiac Enzymes:  Recent Labs Lab 07/20/17 0010 07/20/17 0450 07/20/17 0821  TROPONINI <0.03 <0.03 <0.03   BNP (last 3 results) No results for input(s): PROBNP in the last 8760 hours. HbA1C:  Recent Labs  07/20/17 0010  HGBA1C 10.9*   CBG:  Recent Labs Lab 07/19/17 1433 07/19/17 2050 07/19/17 2239 07/20/17 0631 07/20/17 1144  GLUCAP 476* 380* 337* 304* 214*   Lipid Profile:  Recent Labs  07/20/17 0450  CHOL  109  HDL 40*  LDLCALC 42  TRIG 136  CHOLHDL 2.7   Thyroid Function Tests: No results for input(s): TSH, T4TOTAL, FREET4, T3FREE, THYROIDAB in the last 72 hours. Anemia Panel: No results for input(s): VITAMINB12, FOLATE, FERRITIN, TIBC, IRON, RETICCTPCT in the last 72 hours. Urine analysis:    Component Value Date/Time   COLORURINE YELLOW 07/19/2017 1949   APPEARANCEUR CLEAR 07/19/2017 1949   LABSPEC 1.030 07/19/2017 1949   PHURINE 6.0 07/19/2017 1949   GLUCOSEU >=500 (A) 07/19/2017 1949   HGBUR NEGATIVE 07/19/2017 1949   HGBUR trace-lysed 09/12/2010 1538   BILIRUBINUR NEGATIVE 07/19/2017 1949   BILIRUBINUR N 02/09/2017 1222   KETONESUR NEGATIVE 07/19/2017 1949   PROTEINUR NEGATIVE 07/19/2017 1949   UROBILINOGEN 0.2 02/09/2017 1222   UROBILINOGEN 0.2 11/10/2014 1225   NITRITE NEGATIVE 07/19/2017 1949   LEUKOCYTESUR SMALL (A) 07/19/2017 1949   Sepsis Labs: @LABRCNTIP (procalcitonin:4,lacticidven:4)  ) Recent Results (from the past 240 hour(s))  MRSA PCR Screening     Status: None  Collection Time: 07/19/17 10:53 PM  Result Value Ref Range Status   MRSA by PCR NEGATIVE NEGATIVE Final    Comment:        The GeneXpert MRSA Assay (FDA approved for NASAL specimens only), is one component of a comprehensive MRSA colonization surveillance program. It is not intended to diagnose MRSA infection nor to guide or monitor treatment for MRSA infections.       Radiology Studies: Dg Chest 2 View  Result Date: 07/19/2017 CLINICAL DATA:  TIA EXAM: CHEST  2 VIEW COMPARISON:  10/09/2016 FINDINGS: Subsegmental atelectasis at the left base. Borderline to mild cardiomegaly with minimal central congestion. No consolidation or effusion. Aortic atherosclerosis. No pneumothorax. Degenerative changes of the spine. IMPRESSION: 1. Borderline to mild cardiomegaly with mild central congestion. 2. Subsegmental atelectasis at the left base Electronically Signed   By: Donavan Foil M.D.   On:  07/19/2017 23:59   Ct Head Wo Contrast  Result Date: 07/19/2017 CLINICAL DATA:  83 year old female with progressive weakness for 1 week. EXAM: CT HEAD WITHOUT CONTRAST TECHNIQUE: Contiguous axial images were obtained from the base of the skull through the vertex without intravenous contrast. COMPARISON:  Head CT without contrast 03/30/2017. Brain MRI 10/10/2016, and earlier. FINDINGS: Brain: Cerebral volume remains normal for age. Stable mild for age cerebral white matter hypodensity. No midline shift, ventriculomegaly, mass effect, evidence of mass lesion, intracranial hemorrhage or evidence of cortically based acute infarction. Vascular: Calcified atherosclerosis at the skull base. No suspicious intracranial vascular hyperdensity. Skull: No acute osseous abnormality identified. Sinuses/Orbits: Visualized paranasal sinuses and mastoids are stable and well pneumatized. Other: No acute orbit or scalp soft tissue findings. IMPRESSION: 1.  No acute intracranial abnormality. 2. Stable non contrast CT appearance of the brain since June, with mild for age chronic white matter changes. Electronically Signed   By: Genevie Ann M.D.   On: 07/19/2017 16:21   Mr Brain Wo Contrast  Result Date: 07/20/2017 CLINICAL DATA:  83 y/o F; weakness, slurred speech, word-finding difficulty. Mild headache. EXAM: MRI HEAD WITHOUT CONTRAST MRA HEAD WITHOUT CONTRAST TECHNIQUE: Multiplanar, multiecho pulse sequences of the brain and surrounding structures were obtained without intravenous contrast. Angiographic images of the head were obtained using MRA technique without contrast. COMPARISON:  07/19/2017 CT of the head.  10/10/2016 MRI of the head. FINDINGS: MRI HEAD FINDINGS Brain: Subcentimeter foci of reduced diffusion compatible with acute/early subacute infarction are present within the left inferomedial and superolateral cerebellar hemisphere, left ventrolateral thalamus, and scattered foci are present left parietal subcortical  white matter. (Series 3: Image 12, 16, 25, 31, 35). Foci of acute/early subacute infarction demonstrate associated T2 FLAIR hyperintense signal abnormality. No significant mass effect. No abnormal susceptibility hypointensity to indicate intracranial hemorrhage. Stable background of scattered nonspecific foci of T2 FLAIR hyperintensity in subcortical and periventricular white matter is compatible with mild chronic microvascular ischemic changes for age and there is mild brain parenchymal volume loss. No hydrocephalus, extra-axial collection, effacement of basilar cisterns, or herniation. Vascular: As below. Skull and upper cervical spine: Normal marrow signal. Sinuses/Orbits: Mild anterior ethmoid sinus mucosal thickening. Otherwise negative. Other: Bilateral intra-ocular lens replacement. MRA HEAD FINDINGS Internal carotid arteries:  Patent. Anterior cerebral arteries:  Patent. Middle cerebral arteries: Patent. Anterior communicating artery: Patent. Posterior communicating arteries: Patent left. No right identified, likely hypoplastic or absent. Posterior cerebral arteries:  Patent. Basilar artery:  Patent. Vertebral arteries:  Patent. No evidence of high-grade stenosis, large vessel occlusion, or aneurysm identified. IMPRESSION: 1. Several subcentimeter foci of  acute/early subacute infarction are present within left cerebellar hemisphere, left thalamus, and left parietal subcortical white matter. No associated mass effect or hemorrhage. Multiple vascular distributions favors embolic etiology. 2. Stable background of mild chronic microvascular ischemic changes and parenchymal volume loss of the brain. 3. Patent circle of Willis. No high-grade stenosis, large vessel occlusion, or aneurysm identified. These results will be called to the ordering clinician or representative by the Radiologist Assistant, and communication documented in the PACS or zVision Dashboard. Electronically Signed   By: Kristine Garbe  M.D.   On: 07/20/2017 00:08   [name] Head Wo Contrast  Result Date: 07/20/2017 CLINICAL DATA:  83 y/o F; weakness, slurred speech, word-finding difficulty. Mild headache. EXAM: MRI HEAD WITHOUT CONTRAST MRA HEAD WITHOUT CONTRAST TECHNIQUE: Multiplanar, multiecho pulse sequences of the brain and surrounding structures were obtained without intravenous contrast. Angiographic images of the head were obtained using MRA technique without contrast. COMPARISON:  07/19/2017 CT of the head.  10/10/2016 MRI of the head. FINDINGS: MRI HEAD FINDINGS Brain: Subcentimeter foci of reduced diffusion compatible with acute/early subacute infarction are present within the left inferomedial and superolateral cerebellar hemisphere, left ventrolateral thalamus, and scattered foci are present left parietal subcortical white matter. (Series 3: Image 12, 16, 25, 31, 35). Foci of acute/early subacute infarction demonstrate associated T2 FLAIR hyperintense signal abnormality. No significant mass effect. No abnormal susceptibility hypointensity to indicate intracranial hemorrhage. Stable background of scattered nonspecific foci of T2 FLAIR hyperintensity in subcortical and periventricular white matter is compatible with mild chronic microvascular ischemic changes for age and there is mild brain parenchymal volume loss. No hydrocephalus, extra-axial collection, effacement of basilar cisterns, or herniation. Vascular: As below. Skull and upper cervical spine: Normal marrow signal. Sinuses/Orbits: Mild anterior ethmoid sinus mucosal thickening. Otherwise negative. Other: Bilateral intra-ocular lens replacement. MRA HEAD FINDINGS Internal carotid arteries:  Patent. Anterior cerebral arteries:  Patent. Middle cerebral arteries: Patent. Anterior communicating artery: Patent. Posterior communicating arteries: Patent left. No right identified, likely hypoplastic or absent. Posterior cerebral arteries:  Patent. Basilar artery:  Patent. Vertebral  arteries:  Patent. No evidence of high-grade stenosis, large vessel occlusion, or aneurysm identified. IMPRESSION: 1. Several subcentimeter foci of acute/early subacute infarction are present within left cerebellar hemisphere, left thalamus, and left parietal subcortical white matter. No associated mass effect or hemorrhage. Multiple vascular distributions favors embolic etiology. 2. Stable background of mild chronic microvascular ischemic changes and parenchymal volume loss of the brain. 3. Patent circle of Willis. No high-grade stenosis, large vessel occlusion, or aneurysm identified. These results will be called to the ordering clinician or representative by the Radiologist Assistant, and communication documented in the PACS or zVision Dashboard. Electronically Signed   By: Kristine Garbe M.D.   On: 07/20/2017 00:08     Scheduled Meds: . clopidogrel  75 mg Oral Daily  . enoxaparin (LOVENOX) injection  40 mg Subcutaneous Q24H  . insulin aspart  0-15 Units Subcutaneous TID WC  . insulin aspart  0-5 Units Subcutaneous QHS  . insulin regular human CONCENTRATED  30 Units Subcutaneous BID  . insulin regular human CONCENTRATED  70 Units Subcutaneous Q breakfast  . losartan  50 mg Oral Daily  . miconazole   Topical BID  . zolpidem  5 mg Oral QHS   Continuous Infusions: . sodium chloride 1,000 mL (07/20/17 0259)     LOS: 0 days   Time Spent in minutes   30 minutes  Aarvi Stotts D.O. on 07/20/2017 at 1:14 PM  Between 7am  to 7pm - Pager - [phone]  After 7pm go to [email or web address] - password [email or web address]  And look for the night coverage person covering for me after hours  [facility] Office  [phone]

## 2017-07-21 ENCOUNTER — Inpatient Hospital Stay (HOSPITAL_COMMUNITY): Payer: Medicare Other

## 2017-07-21 DIAGNOSIS — I5030 Unspecified diastolic (congestive) heart failure: Secondary | ICD-10-CM

## 2017-07-21 DIAGNOSIS — I639 Cerebral infarction, unspecified: Secondary | ICD-10-CM

## 2017-07-21 LAB — GLUCOSE, CAPILLARY
GLUCOSE-CAPILLARY: 136 mg/dL — AB (ref 65–99)
GLUCOSE-CAPILLARY: 87 mg/dL (ref 65–99)
Glucose-Capillary: 163 mg/dL — ABNORMAL HIGH (ref 65–99)
Glucose-Capillary: 189 mg/dL — ABNORMAL HIGH (ref 65–99)
Glucose-Capillary: 219 mg/dL — ABNORMAL HIGH (ref 65–99)
Glucose-Capillary: 98 mg/dL (ref 65–99)

## 2017-07-21 LAB — CBC
HCT: 37.1 % (ref 36.0–46.0)
Hemoglobin: 12 g/dL (ref 12.0–15.0)
MCH: 28.6 pg (ref 26.0–34.0)
MCHC: 32.3 g/dL (ref 30.0–36.0)
MCV: 88.3 fL (ref 78.0–100.0)
PLATELETS: 173 10*3/uL (ref 150–400)
RBC: 4.2 MIL/uL (ref 3.87–5.11)
RDW: 14.2 % (ref 11.5–15.5)
WBC: 7.5 10*3/uL (ref 4.0–10.5)

## 2017-07-21 LAB — ECHOCARDIOGRAM COMPLETE
Height: 68 in
Weight: 3732.8 oz

## 2017-07-21 LAB — BASIC METABOLIC PANEL
Anion gap: 7 (ref 5–15)
BUN: 23 mg/dL — AB (ref 6–20)
CALCIUM: 8.3 mg/dL — AB (ref 8.9–10.3)
CO2: 24 mmol/L (ref 22–32)
CREATININE: 0.92 mg/dL (ref 0.44–1.00)
Chloride: 105 mmol/L (ref 101–111)
GFR, EST NON AFRICAN AMERICAN: 57 mL/min — AB (ref >60)
Glucose, Bld: 133 mg/dL — ABNORMAL HIGH (ref 65–99)
POTASSIUM: 4.1 mmol/L (ref 3.5–5.1)
SODIUM: 136 mmol/L (ref 135–145)

## 2017-07-21 MED ORDER — PHENAZOPYRIDINE HCL 100 MG PO TABS
95.0000 mg | ORAL_TABLET | Freq: Three times a day (TID) | ORAL | Status: DC | PRN
  Administered 2017-07-21 (×2): 100 mg via ORAL
  Filled 2017-07-21 (×4): qty 1

## 2017-07-21 NOTE — Progress Notes (Signed)
VASCULAR LAB PRELIMINARY  PRELIMINARY  PRELIMINARY  PRELIMINARY  Bilateral lower extremity venous duplex completed.    Preliminary report:  There is no DVT or SVT noted in the bilateral lower extremities.   Tenisha Fleece, RVT 07/21/2017, 8:55 AM

## 2017-07-21 NOTE — Clinical Social Work Note (Signed)
Clinical Social Work Assessment  Patient Details  Name: Melanie Mcdonald MRN: [id number] Date of Birth: Jan 31, 1933  Date of referral:  07/21/17               Reason for consult:  [employer]                Permission sought to share information with:  Family Supports Permission granted to share information::  Yes, Verbal Permission Granted  Name::        Agency::     Relationship::     Contact Information:     Housing/Transportation Living arrangements for the past 2 months:  Single Family Home Source of Information:  Patient, Adult Children Patient Interpreter Needed:  None Criminal Activity/Legal Involvement Pertinent to Current Situation/Hospitalization:  No - Comment as needed Significant Relationships:  Adult Children Lives with:  Self Do you feel safe going back to the place where you live?  Yes Need for family participation in patient care:  No (Coment)  Care giving concerns: N/A.   Social Worker assessment / plan:  CSW met with patient to discuss SNF placement, patient's son and daughter-in-law present at bedside. Patientpresented as very sleepy, responses were sporadic throughout engagement, most communication took place between Melanie Mcdonald and adult son., Melanie Mcdonald. CSW was able to obtain permission for SNF referral process, CSW explained process, Melanie Mcdonald expressed agreement and understanding. Patient desires to stay in [facility] so that her son is nearby. Patient has never been to SNF before.  Employment status:  Retired Surveyor, [employer] Care PT Recommendations:  [place] / Referral to community resources:     Patient/Family's Response to care: Patient and son in agreement for SNF placement, discussed location, prefers [place].    Patient/Family's Understanding of and Emotional Response to Diagnosis, Current Treatment, and Prognosis:  Patient and son expressed understanding of SNF placement after discharge.  Emotional  Assessment Appearance:    Attitude/Demeanor/Rapport:  Sedated Affect (typically observed):  Accepting Orientation:  Oriented to Self, Oriented to Situation, Oriented to  Time, Oriented to Place Alcohol / Substance use:  Not Applicable Psych involvement (Current and /or in the community):  No (Comment)  Discharge Needs  Concerns to be addressed:    Readmission within the last 30 days:  No Current discharge risk:  None Barriers to Discharge:  No Barriers Identified   Melanie Endo, LCSW 07/21/2017, 1:30 PM

## 2017-07-21 NOTE — Progress Notes (Signed)
Patient is alert and oriented able to ambulate with 1 assist to bathroom, will continue to monitor.

## 2017-07-21 NOTE — Progress Notes (Signed)
Last night and over the last 24 hours patient has been hypoglycemic twice. The patient and I discussed her use of insulin and her understanding of the two types of insulin she was on. She did not realize the humulin was a regular insulin that is concentrated given her level of activity and amount of food she is eating perhaps this should reevaluated. He patient refused this medication last night after her CBG was in the 40's.

## 2017-07-21 NOTE — Progress Notes (Signed)
OFF THE FLOOR 

## 2017-07-21 NOTE — Progress Notes (Signed)
2D Echocardiogram has been performed.  Melanie Mcdonald 07/21/2017, 8:48 AM

## 2017-07-21 NOTE — Progress Notes (Signed)
STROKE TEAM PROGRESS NOTE   SUBJECTIVE (INTERVAL HISTORY) Her RN is at bedside.  No acute event overnight.  No specific complaints.  Pending TEE and loop recorder on Monday.   OBJECTIVE Temp:  [98 F (36.7 C)-99.2 F (37.3 C)] 98.4 F (36.9 C) (10/20 0451) Pulse Rate:  [65-79] 70 (10/20 0451) Cardiac Rhythm: Normal sinus rhythm (10/20 0116) Resp:  [16-20] 18 (10/20 0451) BP: (104-154)/(44-62) 112/44 (10/20 0451) SpO2:  [92 %-97 %] 93 % (10/20 0451)  CBC:   Recent Labs Lab 07/19/17 1433 07/20/17 0010 07/21/17 0450  WBC 6.6  --  7.5  NEUTROABS  --  2.8  --   HGB 13.6  --  12.0  HCT 41.3  --  37.1  MCV 87.5  --  88.3  PLT 157  --  010    Basic Metabolic Panel:   Recent Labs Lab 07/19/17 1433 07/21/17 0450  NA 132* 136  K 4.4 4.1  CL 97* 105  CO2 26 24  GLUCOSE 503* 133*  BUN 23* 23*  CREATININE 0.75 0.92  CALCIUM 9.0 8.3*    Lipid Panel:     Component Value Date/Time   CHOL 109 07/20/2017 0450   CHOL 160 12/21/2014 1153   TRIG 136 07/20/2017 0450   TRIG 62 12/21/2014 1153   HDL 40 (L) 07/20/2017 0450   HDL 74 12/21/2014 1153   CHOLHDL 2.7 07/20/2017 0450   VLDL 27 07/20/2017 0450   LDLCALC 42 07/20/2017 0450   LDLCALC 74 12/21/2014 1153   HgbA1c:  Lab Results  Component Value Date   HGBA1C 10.9 (H) 07/20/2017   Urine Drug Screen:     Component Value Date/Time   LABOPIA NONE DETECTED 07/19/2017 1949   COCAINSCRNUR NONE DETECTED 07/19/2017 1949   LABBENZ NONE DETECTED 07/19/2017 1949   AMPHETMU NONE DETECTED 07/19/2017 1949   THCU NONE DETECTED 07/19/2017 1949   LABBARB NONE DETECTED 07/19/2017 1949    Alcohol Level     Component Value Date/Time   ETH <10 07/19/2017 1433    IMAGING I have personally reviewed the radiological images below and agree with the radiology interpretations.  Dg Chest 2 View 07/19/2017 1. Borderline to mild cardiomegaly with mild central congestion.  2. Subsegmental atelectasis at the left base   Ct Head Wo  Contrast 07/19/2017 IMPRESSION:  1.  No acute intracranial abnormality.  2. Stable non contrast CT appearance of the brain since June, with mild for age chronic white matter changes.   [name] Head Wo Contrast 07/20/2017 IMPRESSION:  1. Several subcentimeter foci of acute/early subacute infarction are present within left cerebellar hemisphere, left thalamus, and left parietal subcortical white matter. No associated mass effect or hemorrhage. Multiple vascular distributions favors embolic etiology.  2. Stable background of mild chronic microvascular ischemic changes and parenchymal volume loss of the brain.  3. Patent circle of Willis. No high-grade stenosis, large vessel occlusion, or aneurysm identified.   CT Angio Head and Neck 07/20/2017 IMPRESSION: 1. Known posterior circulation infarcts with no flow limiting stenosis or visible embolic source. 2. Mild for age atherosclerosis. 3. Mild mediastinal adenopathy that is increased from a chest CT in 2015. A diagnostic chest CT could further characterize.  LE venous Doppler - no DVT  TTE - pending  TEE pending   PHYSICAL EXAM Vitals:   07/20/17 1700 07/20/17 2100 07/21/17 0100 07/21/17 0451  BP: (!) 123/49 (!) 154/58 104/62 (!) 112/44  Pulse: 68 79 69 70  Resp: 16 20 20  18  Temp: 98.7 F (37.1 C) 98.8 F (37.1 C) 98.7 F (37.1 C) 98.4 F (36.9 C)  TempSrc: Oral Oral Oral Oral  SpO2: 95% 97% 93% 93%  Weight:      Height:        PHYSICAL EXAM Gen: Obesity, NAD Eyes: anicteric sclerae, moist conjunctivae                    CV: no MRG, no carotid bruits, no peripheral edema Neuro: Mental Status: Alert, follows commands, good historian No aphasia, no dysarthria  Cranial Nerves:   The pupils are equal, round, and reactive to light.. Attempted, Fundi not visualized.  EOMI. No gaze preference. Visual fields full. Face symmetric, Tongue midline. Hearing intact to voice. Shoulder shrug intact  Motor Observation:    no  involuntary movements noted. Tone appears normal.     Strength:    5/5     Sensation:  Intact to LT  Plantars downgoing.    ASSESSMENT/PLAN Ms. BENTLEY HARALSON is a 83 y.o. female with history of TIAs, PVD, narcolepsy, HTN, HLD, DM, depression, CAD, COPD and chronic pain presenting with word finding problems, weakness and confusion. She did not receive IV t-PA due to late presentation.  Strokes:  several punctate left cerebellum, left thalamus, left MCA/PCA and left MCA/ACA infarcts, embolic - source unknown.   Resultant deficit resolved  CT head - No acute intracranial abnormality.   MRI head - Several subcentimeter foci of acute/early subacute infarction are present within left cerebellar hemisphere, left thalamus, and left parietal subcortical white matter  MRA head - unremarkable.   CTA H&N - no flow limiting stenosis or visible embolic source.  Bilateral LE dopplers - no DVT.   2D Echo - pending  TEE and loop recorder pending  LDL - 42  HgbA1c - 10.9  VTE prophylaxis - Lovenox Diet heart healthy/carb modified Room service appropriate? Yes; Fluid consistency: Thin Diet NPO time specified Except for: Sips with Meds  No antithrombotic prior to admission, now on clopidogrel 75 mg daily - Reported aspirin allergy, continue Plavix on discharge  Patient counseled to be compliant with her antithrombotic medications  Ongoing aggressive stroke risk factor management  Therapy recommendations: pending  Disposition: Pending  History of TIA  10/2016 -slurred speech -MRI no acute infarct, MRA, carotid Doppler, 2D echo, LE venous Doppler all negative -LDL 61 and A1c 9.6 -put on aspirin 81  Hypertension  Stable  Permissive hypertension (OK if < 220/120) but gradually normalize in 5-7 days  Long-term BP goal normotensive  Diabetes  HgbA1c 10.9, goal < 7.0  Uncontrolled  On insulin   SSI  CBG monitoring  Close PCP follow-up for better DM control  Other Stroke  Risk Factors  Advanced age  Obesity, Body mass index is 35.47 kg/m., recommend weight loss, diet and exercise as appropriate   Coronary artery disease  Other Active Problems  Mild hyponatremia - 132  Hypoglycemia - poor PO intake - diabetes coordinator consult pending.   Reported aspirin allergy  RLS on Mirapex  Follows with Dr. Delice Lesch at [facility] day # 1  Rosalin Hawking, MD PhD Stroke Neurology 07/21/2017 3:44 PM   To contact Stroke Continuity provider, please refer to [email or web address] After hours, contact General Neurology

## 2017-07-21 NOTE — Progress Notes (Signed)
PROGRESS NOTE    Melanie Mcdonald  MLY:[id number] DOB: Sep 21, 1933 DOA: 07/19/2017 PCP: Laurey Morale, MD   Chief Complaint  Patient presents with  . Weakness    Brief Narrative:  HPI on 07/19/2017 by Dr. [name] Melanie Mcdonald is a 83 y.o. female with medical history significant of TIA; PVD; narcolepsy; HTN; HLD; DM; depression; CAD; COPD; and chronic pain presenting due to "Strokish".  Her son noticed that she was different since Saturday.  She gets confused and can't talk.  She knows what she wants to say but can't get the words out.  Her symptoms are intermittent, but she has been getting chronically weaker the whole time. She had a mini-stroke 4-5 times in the last 3-4 years.  She had one Saturday and it just keeps coming back.  Most times she relaxes and calms down and within 3-4 hours it goes away but this time it kept coming back.    Assessment & Plan   Acute CVA -CT head showed no acute abnormality -MRI/MRA brain: Several subcentimeter foci of acute/early subacute infarction within left cerebellar hemisphere, left thalamus, left parietal subcortical white matter. Patent circle of Willis, no high-grade stenosis, large vessel occlusion or aneurysm identified -CTA neck: Known posterior circulation infarcts with no flow-limiting stenosis or visible embolic source. -Neurology consulted and appreciated; discussed with Dr. Jaynee Eagles, recommended TEE -Hemoglobin A1c 10.9, LDL 42 -Cardiology notified of TEE- plan for Monday -Echocardiogram: read pending -PT recommended SNF -Continue Plavix  Essential Hypertension -Cozaar held initally, restart in AM if BP high  Hyperlipidemia -Lipid panel: Total cholesterol 109, HDL 40, LDL 42, triglycerides 136 -Currently on no cholesterol medications  Diabetes mellitus, type II -Poorly controlled, hemoglobin A1c 10.9 -refuses insulin sliding scale -monitor closely as on U500- large doses and not eating well  here  Depression/narcolepsy -Dextroamphetamine held  Candidiasis -Was given 1 dose of Diflucan IV, continue topical Monistat  Diabetic foot ulcer, left -Being followed by a podiatrist, patient states she's been on antibiotics for the past several months, however cannot remember the name of the antibiotic.  Mediastinal adenopathy -Noted on CTA neck, appears to be increased as compared to CT chest done in 2015 -will need outpatient follow up  Restless leg syndrome -Continue home dose of mirapex, discussed with pharmacy -continue gabapentin   DVT Prophylaxis  lovenox  Code Status: DO NOT RESUSCITATE  Family Communication: None at bedside  Disposition Plan: TEE Monday and then SNF  Consultants Neurology Cardiology  Procedures  None  Antibiotics   Anti-infectives    Start     Dose/Rate Route Frequency Ordered Stop   07/20/17 2000  cephALEXin (KEFLEX) capsule 500 mg     500 mg Oral 3 times daily 07/20/17 1646     07/20/17 0230  fluconazole (DIFLUCAN) IVPB 200 mg     200 mg 100 mL/hr over 60 Minutes Intravenous  Once 07/20/17 0159 07/20/17 8127      Subjective:   No liking the food, so not eating much  Objective:   Vitals:   07/20/17 2100 07/21/17 0100 07/21/17 0451 07/21/17 1054  BP: (!) 154/58 104/62 (!) 112/44 (!) 130/55  Pulse: 79 69 70 74  Resp: 20 20 18 17   Temp: 98.8 F (37.1 C) 98.7 F (37.1 C) 98.4 F (36.9 C) 98.2 F (36.8 C)  TempSrc: Oral Oral Oral Axillary  SpO2: 97% 93% 93% 94%  Weight:      Height:        Intake/Output Summary (Last 24  hours) at 07/21/17 1249 Last data filed at 07/20/17 1500  Gross per 24 hour  Intake              400 ml  Output                0 ml  Net              400 ml   Filed Weights   07/19/17 2223  Weight: 105.8 kg (233 lb 4.8 oz)    Exam  General: NAD, in bed  Cardiovascular: rrr  Respiratory: clear  Abdomen: +BS, soft  Extremities: mild LE edema with some chronic changes of her skin    Data  Reviewed: I have personally reviewed following labs and imaging studies  CBC:  Recent Labs Lab 07/19/17 1433 07/20/17 0010 07/21/17 0450  WBC 6.6  --  7.5  NEUTROABS  --  2.8  --   HGB 13.6  --  12.0  HCT 41.3  --  37.1  MCV 87.5  --  88.3  PLT 157  --  161   Basic Metabolic Panel:  Recent Labs Lab 07/19/17 1433 07/21/17 0450  NA 132* 136  K 4.4 4.1  CL 97* 105  CO2 26 24  GLUCOSE 503* 133*  BUN 23* 23*  CREATININE 0.75 0.92  CALCIUM 9.0 8.3*   GFR: Estimated Creatinine Clearance: 61.1 mL/min (by C-G formula based on SCr of 0.92 mg/dL). Liver Function Tests:  Recent Labs Lab 07/20/17 0010  AST 14*  ALT 14  ALKPHOS 84  BILITOT 0.5  PROT 7.0  ALBUMIN 3.2*   No results for input(s): LIPASE, AMYLASE in the last 168 hours. No results for input(s): AMMONIA in the last 168 hours. Coagulation Profile:  Recent Labs Lab 07/19/17 1433  INR 1.04   Cardiac Enzymes:  Recent Labs Lab 07/20/17 0010 07/20/17 0450 07/20/17 0821  TROPONINI <0.03 <0.03 <0.03   BNP (last 3 results) No results for input(s): PROBNP in the last 8760 hours. HbA1C:  Recent Labs  07/20/17 0010  HGBA1C 10.9*   CBG:  Recent Labs Lab 07/20/17 2142 07/21/17 0617 07/21/17 0755 07/21/17 1044 07/21/17 1135  GLUCAP 72 136* 163* 189* 219*   Lipid Profile:  Recent Labs  07/20/17 0450  CHOL 109  HDL 40*  LDLCALC 42  TRIG 136  CHOLHDL 2.7   Thyroid Function Tests: No results for input(s): TSH, T4TOTAL, FREET4, T3FREE, THYROIDAB in the last 72 hours. Anemia Panel: No results for input(s): VITAMINB12, FOLATE, FERRITIN, TIBC, IRON, RETICCTPCT in the last 72 hours. Urine analysis:    Component Value Date/Time   COLORURINE YELLOW 07/19/2017 1949   APPEARANCEUR CLEAR 07/19/2017 1949   LABSPEC 1.030 07/19/2017 1949   PHURINE 6.0 07/19/2017 1949   GLUCOSEU >=500 (A) 07/19/2017 1949   HGBUR NEGATIVE 07/19/2017 1949   HGBUR trace-lysed 09/12/2010 1538   BILIRUBINUR NEGATIVE  07/19/2017 1949   BILIRUBINUR N 02/09/2017 1222   KETONESUR NEGATIVE 07/19/2017 1949   PROTEINUR NEGATIVE 07/19/2017 1949   UROBILINOGEN 0.2 02/09/2017 1222   UROBILINOGEN 0.2 11/10/2014 1225   NITRITE NEGATIVE 07/19/2017 1949   LEUKOCYTESUR SMALL (A) 07/19/2017 1949   Sepsis Labs:   ) Recent Results (from the past 240 hour(s))  MRSA PCR Screening     Status: None   Collection Time: 07/19/17 10:53 PM  Result Value Ref Range Status   MRSA by PCR NEGATIVE NEGATIVE Final    Comment:        The GeneXpert MRSA Assay (  FDA approved for NASAL specimens only), is one component of a comprehensive MRSA colonization surveillance program. It is not intended to diagnose MRSA infection nor to guide or monitor treatment for MRSA infections.       Radiology Studies: Ct Angio Head W Or Wo Contrast  Result Date: 07/20/2017 CLINICAL DATA:  Embolic stroke on the left.  Left-sided neck pain. EXAM: CT ANGIOGRAPHY HEAD AND NECK TECHNIQUE: Multidetector CT imaging of the head and neck was performed using the standard protocol during bolus administration of intravenous contrast. Multiplanar CT image reconstructions and MIPs were obtained to evaluate the vascular anatomy. Carotid stenosis measurements (when applicable) are obtained utilizing NASCET criteria, using the distal internal carotid diameter as the denominator. CONTRAST:  50 cc Isovue 370 intravenous COMPARISON:  Brain MRI from earlier today. Intracranial MRA 10/10/2016 FINDINGS: CTA NECK FINDINGS Aortic arch: Atheromatous changes. No visualized acute finding. Three vessel branching. Right carotid system: Mild to moderate degree of predominately calcified plaque at the common carotid bifurcation. No stenosis or ulceration. No beading or dissection. Left carotid system: No notable atheromatous changes for age. No stenosis, beading, or ulceration. Vertebral arteries: No proximal subclavian flow limiting stenosis or irregularity. Codominant vertebral  arteries that are both smooth and widely patent to the dura Skeleton: No acute or aggressive finding. Other neck: No incidental mass or adenopathy in the neck. Nasal septal perforation. Upper chest: No acute finding. Increased size of mediastinal lymph nodes compared to 2015 chest CT, including a 17 mm short axis right peritracheal node. Review of the MIP images confirms the above findings CTA HEAD FINDINGS Anterior circulation: Overall mild calcified plaque on the carotid siphons. Atherosclerotic irregularity of bilateral medium size vessels without branch occlusion. No reversible proximal stenosis. Questionable sub mm bulge at the right MCA bifurcation. Posterior circulation: Symmetric vertebral arteries with mild atheromatous change. Vertebral and basilar arteries are smooth and widely patent. Good proximal PCA patency. Fairly symmetric branch visualization. Negative for aneurysm. Venous sinuses: Patent Anatomic variants: None significant Delayed phase: No abnormal intracranial enhancement Review of the MIP images confirms the above findings IMPRESSION: 1. Known posterior circulation infarcts with no flow limiting stenosis or visible embolic source. 2. Mild for age atherosclerosis. 3. Mild mediastinal adenopathy that is increased from a chest CT in 2015. A diagnostic chest CT could further characterize. Electronically Signed   By: Monte Fantasia M.D.   On: 07/20/2017 13:21   Dg Chest 2 View  Result Date: 07/19/2017 CLINICAL DATA:  TIA EXAM: CHEST  2 VIEW COMPARISON:  10/09/2016 FINDINGS: Subsegmental atelectasis at the left base. Borderline to mild cardiomegaly with minimal central congestion. No consolidation or effusion. Aortic atherosclerosis. No pneumothorax. Degenerative changes of the spine. IMPRESSION: 1. Borderline to mild cardiomegaly with mild central congestion. 2. Subsegmental atelectasis at the left base Electronically Signed   By: Donavan Foil M.D.   On: 07/19/2017 23:59   Ct Head Wo  Contrast  Result Date: 07/19/2017 CLINICAL DATA:  83 year old female with progressive weakness for 1 week. EXAM: CT HEAD WITHOUT CONTRAST TECHNIQUE: Contiguous axial images were obtained from the base of the skull through the vertex without intravenous contrast. COMPARISON:  Head CT without contrast 03/30/2017. Brain MRI 10/10/2016, and earlier. FINDINGS: Brain: Cerebral volume remains normal for age. Stable mild for age cerebral white matter hypodensity. No midline shift, ventriculomegaly, mass effect, evidence of mass lesion, intracranial hemorrhage or evidence of cortically based acute infarction. Vascular: Calcified atherosclerosis at the skull base. No suspicious intracranial vascular hyperdensity. Skull: No acute osseous  abnormality identified. Sinuses/Orbits: Visualized paranasal sinuses and mastoids are stable and well pneumatized. Other: No acute orbit or scalp soft tissue findings. IMPRESSION: 1.  No acute intracranial abnormality. 2. Stable non contrast CT appearance of the brain since June, with mild for age chronic white matter changes. Electronically Signed   By: Genevie Ann M.D.   On: 07/19/2017 16:21   Ct Angio Neck W Or Wo Contrast  Result Date: 07/20/2017 CLINICAL DATA:  Embolic stroke on the left.  Left-sided neck pain. EXAM: CT ANGIOGRAPHY HEAD AND NECK TECHNIQUE: Multidetector CT imaging of the head and neck was performed using the standard protocol during bolus administration of intravenous contrast. Multiplanar CT image reconstructions and MIPs were obtained to evaluate the vascular anatomy. Carotid stenosis measurements (when applicable) are obtained utilizing NASCET criteria, using the distal internal carotid diameter as the denominator. CONTRAST:  50 cc Isovue 370 intravenous COMPARISON:  Brain MRI from earlier today. Intracranial MRA 10/10/2016 FINDINGS: CTA NECK FINDINGS Aortic arch: Atheromatous changes. No visualized acute finding. Three vessel branching. Right carotid system: Mild  to moderate degree of predominately calcified plaque at the common carotid bifurcation. No stenosis or ulceration. No beading or dissection. Left carotid system: No notable atheromatous changes for age. No stenosis, beading, or ulceration. Vertebral arteries: No proximal subclavian flow limiting stenosis or irregularity. Codominant vertebral arteries that are both smooth and widely patent to the dura Skeleton: No acute or aggressive finding. Other neck: No incidental mass or adenopathy in the neck. Nasal septal perforation. Upper chest: No acute finding. Increased size of mediastinal lymph nodes compared to 2015 chest CT, including a 17 mm short axis right peritracheal node. Review of the MIP images confirms the above findings CTA HEAD FINDINGS Anterior circulation: Overall mild calcified plaque on the carotid siphons. Atherosclerotic irregularity of bilateral medium size vessels without branch occlusion. No reversible proximal stenosis. Questionable sub mm bulge at the right MCA bifurcation. Posterior circulation: Symmetric vertebral arteries with mild atheromatous change. Vertebral and basilar arteries are smooth and widely patent. Good proximal PCA patency. Fairly symmetric branch visualization. Negative for aneurysm. Venous sinuses: Patent Anatomic variants: None significant Delayed phase: No abnormal intracranial enhancement Review of the MIP images confirms the above findings IMPRESSION: 1. Known posterior circulation infarcts with no flow limiting stenosis or visible embolic source. 2. Mild for age atherosclerosis. 3. Mild mediastinal adenopathy that is increased from a chest CT in 2015. A diagnostic chest CT could further characterize. Electronically Signed   By: Monte Fantasia M.D.   On: 07/20/2017 13:21   Mr Brain Wo Contrast  Result Date: 07/20/2017 CLINICAL DATA:  83 y/o F; weakness, slurred speech, word-finding difficulty. Mild headache. EXAM: MRI HEAD WITHOUT CONTRAST MRA HEAD WITHOUT CONTRAST  TECHNIQUE: Multiplanar, multiecho pulse sequences of the brain and surrounding structures were obtained without intravenous contrast. Angiographic images of the head were obtained using MRA technique without contrast. COMPARISON:  07/19/2017 CT of the head.  10/10/2016 MRI of the head. FINDINGS: MRI HEAD FINDINGS Brain: Subcentimeter foci of reduced diffusion compatible with acute/early subacute infarction are present within the left inferomedial and superolateral cerebellar hemisphere, left ventrolateral thalamus, and scattered foci are present left parietal subcortical white matter. (Series 3: Image 12, 16, 25, 31, 35). Foci of acute/early subacute infarction demonstrate associated T2 FLAIR hyperintense signal abnormality. No significant mass effect. No abnormal susceptibility hypointensity to indicate intracranial hemorrhage. Stable background of scattered nonspecific foci of T2 FLAIR hyperintensity in subcortical and periventricular white matter is compatible with mild chronic microvascular  ischemic changes for age and there is mild brain parenchymal volume loss. No hydrocephalus, extra-axial collection, effacement of basilar cisterns, or herniation. Vascular: As below. Skull and upper cervical spine: Normal marrow signal. Sinuses/Orbits: Mild anterior ethmoid sinus mucosal thickening. Otherwise negative. Other: Bilateral intra-ocular lens replacement. MRA HEAD FINDINGS Internal carotid arteries:  Patent. Anterior cerebral arteries:  Patent. Middle cerebral arteries: Patent. Anterior communicating artery: Patent. Posterior communicating arteries: Patent left. No right identified, likely hypoplastic or absent. Posterior cerebral arteries:  Patent. Basilar artery:  Patent. Vertebral arteries:  Patent. No evidence of high-grade stenosis, large vessel occlusion, or aneurysm identified. IMPRESSION: 1. Several subcentimeter foci of acute/early subacute infarction are present within left cerebellar hemisphere, left  thalamus, and left parietal subcortical white matter. No associated mass effect or hemorrhage. Multiple vascular distributions favors embolic etiology. 2. Stable background of mild chronic microvascular ischemic changes and parenchymal volume loss of the brain. 3. Patent circle of Willis. No high-grade stenosis, large vessel occlusion, or aneurysm identified. These results will be called to the ordering clinician or representative by the Radiologist Assistant, and communication documented in the PACS or zVision Dashboard. Electronically Signed   By: Kristine Garbe M.D.   On: 07/20/2017 00:08   [name] Head Wo Contrast  Result Date: 07/20/2017 CLINICAL DATA:  82 y/o F; weakness, slurred speech, word-finding difficulty. Mild headache. EXAM: MRI HEAD WITHOUT CONTRAST MRA HEAD WITHOUT CONTRAST TECHNIQUE: Multiplanar, multiecho pulse sequences of the brain and surrounding structures were obtained without intravenous contrast. Angiographic images of the head were obtained using MRA technique without contrast. COMPARISON:  07/19/2017 CT of the head.  10/10/2016 MRI of the head. FINDINGS: MRI HEAD FINDINGS Brain: Subcentimeter foci of reduced diffusion compatible with acute/early subacute infarction are present within the left inferomedial and superolateral cerebellar hemisphere, left ventrolateral thalamus, and scattered foci are present left parietal subcortical white matter. (Series 3: Image 12, 16, 25, 31, 35). Foci of acute/early subacute infarction demonstrate associated T2 FLAIR hyperintense signal abnormality. No significant mass effect. No abnormal susceptibility hypointensity to indicate intracranial hemorrhage. Stable background of scattered nonspecific foci of T2 FLAIR hyperintensity in subcortical and periventricular white matter is compatible with mild chronic microvascular ischemic changes for age and there is mild brain parenchymal volume loss. No hydrocephalus, extra-axial collection,  effacement of basilar cisterns, or herniation. Vascular: As below. Skull and upper cervical spine: Normal marrow signal. Sinuses/Orbits: Mild anterior ethmoid sinus mucosal thickening. Otherwise negative. Other: Bilateral intra-ocular lens replacement. MRA HEAD FINDINGS Internal carotid arteries:  Patent. Anterior cerebral arteries:  Patent. Middle cerebral arteries: Patent. Anterior communicating artery: Patent. Posterior communicating arteries: Patent left. No right identified, likely hypoplastic or absent. Posterior cerebral arteries:  Patent. Basilar artery:  Patent. Vertebral arteries:  Patent. No evidence of high-grade stenosis, large vessel occlusion, or aneurysm identified. IMPRESSION: 1. Several subcentimeter foci of acute/early subacute infarction are present within left cerebellar hemisphere, left thalamus, and left parietal subcortical white matter. No associated mass effect or hemorrhage. Multiple vascular distributions favors embolic etiology. 2. Stable background of mild chronic microvascular ischemic changes and parenchymal volume loss of the brain. 3. Patent circle of Willis. No high-grade stenosis, large vessel occlusion, or aneurysm identified. These results will be called to the ordering clinician or representative by the Radiologist Assistant, and communication documented in the PACS or zVision Dashboard. Electronically Signed   By: Kristine Garbe M.D.   On: 07/20/2017 00:08     Scheduled Meds: . cephALEXin  500 mg Oral TID  . clopidogrel  75  mg Oral Daily  . enoxaparin (LOVENOX) injection  40 mg Subcutaneous Q24H  . insulin aspart  0-15 Units Subcutaneous TID WC  . insulin aspart  0-5 Units Subcutaneous QHS  . insulin regular human CONCENTRATED  30 Units Subcutaneous BID  . insulin regular human CONCENTRATED  70 Units Subcutaneous Q breakfast  . losartan  50 mg Oral Daily  . miconazole   Topical BID  . zolpidem  5 mg Oral QHS   Continuous Infusions: . sodium chloride  1,000 mL (07/20/17 0259)     LOS: 1 day   Time Spent in minutes   30 minutes  Corley Kohls U Bonny Egger D.O. on 07/21/2017 at 12:49 PM    [facility] Office  [phone]

## 2017-07-22 ENCOUNTER — Inpatient Hospital Stay (HOSPITAL_COMMUNITY): Payer: Medicare Other

## 2017-07-22 LAB — GLUCOSE, CAPILLARY
GLUCOSE-CAPILLARY: 139 mg/dL — AB (ref 65–99)
GLUCOSE-CAPILLARY: 86 mg/dL (ref 65–99)
Glucose-Capillary: 178 mg/dL — ABNORMAL HIGH (ref 65–99)
Glucose-Capillary: 139 mg/dL — ABNORMAL HIGH (ref 65–99)

## 2017-07-22 MED ORDER — INSULIN REGULAR HUMAN (CONC) 500 UNIT/ML ~~LOC~~ SOPN: 15.0000 [IU] | PEN_INJECTOR | Freq: Two times a day (BID) | SUBCUTANEOUS | Status: DC

## 2017-07-22 MED ORDER — SODIUM CHLORIDE 0.9 % IV SOLN
INTRAVENOUS | Status: DC
  Administered 2017-07-22 – 2017-07-23 (×2): via INTRAVENOUS

## 2017-07-22 MED ORDER — INSULIN REGULAR HUMAN (CONC) 500 UNIT/ML ~~LOC~~ SOPN
15.0000 [IU] | PEN_INJECTOR | Freq: Two times a day (BID) | SUBCUTANEOUS | Status: DC
  Administered 2017-07-22 (×2): 15 [IU] via SUBCUTANEOUS
  Filled 2017-07-22: qty 3

## 2017-07-22 MED ORDER — INSULIN REGULAR HUMAN (CONC) 500 UNIT/ML ~~LOC~~ SOPN
15.0000 [IU] | PEN_INJECTOR | Freq: Two times a day (BID) | SUBCUTANEOUS | Status: DC
  Administered 2017-07-22 – 2017-07-24 (×3): 15 [IU] via SUBCUTANEOUS

## 2017-07-22 NOTE — Progress Notes (Signed)
PROGRESS NOTE    ALDONA BRYNER  YIF:[id number] DOB: [birth year] DOA: 07/19/2017 PCP: Laurey Morale, MD   Chief Complaint  Patient presents with  . Weakness    Brief Narrative:  HPI on 07/19/2017 by Dr. [name] NIXON SPARR is a 83 y.o. female with medical history significant of TIA; PVD; narcolepsy; HTN; HLD; DM; depression; CAD; COPD; and chronic pain presenting due to "Strokish".  Her son noticed that she was different since Saturday.  She gets confused and can't talk.  She knows what she wants to say but can't get the words out.  Her symptoms are intermittent, but she has been getting chronically weaker the whole time. She had a mini-stroke 4-5 times in the last 3-4 years.  She had one Saturday and it just keeps coming back.  Most times she relaxes and calms down and within 3-4 hours it goes away but this time it kept coming back.    Assessment & Plan   Acute CVA -CT head showed no acute abnormality -MRI/MRA brain: Several subcentimeter foci of acute/early subacute infarction within left cerebellar hemisphere, left thalamus, left parietal subcortical white matter. Patent circle of Willis, no high-grade stenosis, large vessel occlusion or aneurysm identified -CTA neck: Known posterior circulation infarcts with no flow-limiting stenosis or visible embolic source. -Neurology consulted and appreciated; discussed with Dr. Jaynee Eagles, recommended TEE -Hemoglobin A1c 10.9, LDL 42 -Cardiology notified of TEE- plan for Monday along with Loop recorder -Echocardiogram: normal EF with mitral valve stenosis -PT recommended SNF -Continue Plavix  Essential Hypertension -Cozaar held initally, restart in AM if BP high  Hyperlipidemia -Lipid panel: Total cholesterol 109, HDL 40, LDL 42, triglycerides 136 -Currently on no cholesterol medications  Diabetes mellitus, type II -Poorly controlled, hemoglobin A1c 10.9 -refuses insulin sliding scale -adjust  U500  Depression/narcolepsy -Dextroamphetamine held  Candidiasis -Was given 1 dose of Diflucan IV, continue topical Monistat  Diabetic foot ulcer, left -Being followed by a podiatrist  Mediastinal adenopathy -Noted on CTA neck, appears to be increased as compared to CT chest done in 2015 -will need outpatient follow up  Restless leg syndrome -Continue home dose of mirapex, discussed with pharmacy -continue gabapentin   DVT Prophylaxis  lovenox  Code Status: DO NOT RESUSCITATE  Family Communication: None at bedside  Disposition Plan: TEE Monday and then SNF  Consultants Neurology Cardiology  Procedures  None  Antibiotics   Anti-infectives    Start     Dose/Rate Route Frequency Ordered Stop   07/20/17 2000  cephALEXin (KEFLEX) capsule 500 mg     500 mg Oral 3 times daily 07/20/17 1646     07/20/17 0230  fluconazole (DIFLUCAN) IVPB 200 mg     200 mg 100 mL/hr over 60 Minutes Intravenous  Once 07/20/17 0159 07/20/17 8676      Subjective:   No chest pain- worried about TEE- said she was told she should not be put to sleep  Objective:   Vitals:   07/21/17 2100 07/22/17 0134 07/22/17 0500 07/22/17 1111  BP: (!) 135/53 (!) 159/55 (!) 125/52 139/74  Pulse: 80 72 78 71  Resp: 18 18 18 18   Temp: 98.5 F (36.9 C) 98.5 F (36.9 C) 98.2 F (36.8 C) 98 F (36.7 C)  TempSrc: Oral Oral Oral Oral  SpO2: 95% 97% 98% 97%  Weight:      Height:        Intake/Output Summary (Last 24 hours) at 07/22/17 1249 Last data filed at 07/21/17 1700  Gross  per 24 hour  Intake              120 ml  Output                0 ml  Net              120 ml   Filed Weights   07/19/17 2223  Weight: 105.8 kg (233 lb 4.8 oz)    Exam  General: NAD in bed  Cardiovascular: rrr  Respiratory: clear  Abdomen: +BS, soft  Extremities: redness on LE    Data Reviewed: I have personally reviewed following labs and imaging studies  CBC:  Recent Labs Lab 07/19/17 1433  07/20/17 0010 07/21/17 0450  WBC 6.6  --  7.5  NEUTROABS  --  2.8  --   HGB 13.6  --  12.0  HCT 41.3  --  37.1  MCV 87.5  --  88.3  PLT 157  --  542   Basic Metabolic Panel:  Recent Labs Lab 07/19/17 1433 07/21/17 0450  NA 132* 136  K 4.4 4.1  CL 97* 105  CO2 26 24  GLUCOSE 503* 133*  BUN 23* 23*  CREATININE 0.75 0.92  CALCIUM 9.0 8.3*   GFR: Estimated Creatinine Clearance: 61.1 mL/min (by C-G formula based on SCr of 0.92 mg/dL). Liver Function Tests:  Recent Labs Lab 07/20/17 0010  AST 14*  ALT 14  ALKPHOS 84  BILITOT 0.5  PROT 7.0  ALBUMIN 3.2*   No results for input(s): LIPASE, AMYLASE in the last 168 hours. No results for input(s): AMMONIA in the last 168 hours. Coagulation Profile:  Recent Labs Lab 07/19/17 1433  INR 1.04   Cardiac Enzymes:  Recent Labs Lab 07/20/17 0010 07/20/17 0450 07/20/17 0821  TROPONINI <0.03 <0.03 <0.03   BNP (last 3 results) No results for input(s): PROBNP in the last 8760 hours. HbA1C:  Recent Labs  07/20/17 0010  HGBA1C 10.9*   CBG:  Recent Labs Lab 07/21/17 1135 07/21/17 1633 07/21/17 2115 07/22/17 0629 07/22/17 1130  GLUCAP 219* 87 98 178* 139*   Lipid Profile:  Recent Labs  07/20/17 0450  CHOL 109  HDL 40*  LDLCALC 42  TRIG 136  CHOLHDL 2.7   Thyroid Function Tests: No results for input(s): TSH, T4TOTAL, FREET4, T3FREE, THYROIDAB in the last 72 hours. Anemia Panel: No results for input(s): VITAMINB12, FOLATE, FERRITIN, TIBC, IRON, RETICCTPCT in the last 72 hours. Urine analysis:    Component Value Date/Time   COLORURINE YELLOW 07/19/2017 1949   APPEARANCEUR CLEAR 07/19/2017 1949   LABSPEC 1.030 07/19/2017 1949   PHURINE 6.0 07/19/2017 1949   GLUCOSEU >=500 (A) 07/19/2017 1949   HGBUR NEGATIVE 07/19/2017 1949   HGBUR trace-lysed 09/12/2010 1538   BILIRUBINUR NEGATIVE 07/19/2017 1949   BILIRUBINUR N 02/09/2017 1222   KETONESUR NEGATIVE 07/19/2017 1949   PROTEINUR NEGATIVE  07/19/2017 1949   UROBILINOGEN 0.2 02/09/2017 1222   UROBILINOGEN 0.2 11/10/2014 1225   NITRITE NEGATIVE 07/19/2017 1949   LEUKOCYTESUR SMALL (A) 07/19/2017 1949   Sepsis Labs:   ) Recent Results (from the past 240 hour(s))  MRSA PCR Screening     Status: None   Collection Time: 07/19/17 10:53 PM  Result Value Ref Range Status   MRSA by PCR NEGATIVE NEGATIVE Final    Comment:        The GeneXpert MRSA Assay (FDA approved for NASAL specimens only), is one component of a comprehensive MRSA colonization surveillance program. It is not  intended to diagnose MRSA infection nor to guide or monitor treatment for MRSA infections.       Radiology Studies: Ct Angio Head W Or Wo Contrast  Result Date: 07/20/2017 CLINICAL DATA:  Embolic stroke on the left.  Left-sided neck pain. EXAM: CT ANGIOGRAPHY HEAD AND NECK TECHNIQUE: Multidetector CT imaging of the head and neck was performed using the standard protocol during bolus administration of intravenous contrast. Multiplanar CT image reconstructions and MIPs were obtained to evaluate the vascular anatomy. Carotid stenosis measurements (when applicable) are obtained utilizing NASCET criteria, using the distal internal carotid diameter as the denominator. CONTRAST:  50 cc Isovue 370 intravenous COMPARISON:  Brain MRI from earlier today. Intracranial MRA 10/10/2016 FINDINGS: CTA NECK FINDINGS Aortic arch: Atheromatous changes. No visualized acute finding. Three vessel branching. Right carotid system: Mild to moderate degree of predominately calcified plaque at the common carotid bifurcation. No stenosis or ulceration. No beading or dissection. Left carotid system: No notable atheromatous changes for age. No stenosis, beading, or ulceration. Vertebral arteries: No proximal subclavian flow limiting stenosis or irregularity. Codominant vertebral arteries that are both smooth and widely patent to the dura Skeleton: No acute or aggressive finding. Other  neck: No incidental mass or adenopathy in the neck. Nasal septal perforation. Upper chest: No acute finding. Increased size of mediastinal lymph nodes compared to 2015 chest CT, including a 17 mm short axis right peritracheal node. Review of the MIP images confirms the above findings CTA HEAD FINDINGS Anterior circulation: Overall mild calcified plaque on the carotid siphons. Atherosclerotic irregularity of bilateral medium size vessels without branch occlusion. No reversible proximal stenosis. Questionable sub mm bulge at the right MCA bifurcation. Posterior circulation: Symmetric vertebral arteries with mild atheromatous change. Vertebral and basilar arteries are smooth and widely patent. Good proximal PCA patency. Fairly symmetric branch visualization. Negative for aneurysm. Venous sinuses: Patent Anatomic variants: None significant Delayed phase: No abnormal intracranial enhancement Review of the MIP images confirms the above findings IMPRESSION: 1. Known posterior circulation infarcts with no flow limiting stenosis or visible embolic source. 2. Mild for age atherosclerosis. 3. Mild mediastinal adenopathy that is increased from a chest CT in 2015. A diagnostic chest CT could further characterize. Electronically Signed   By: Monte Fantasia M.D.   On: 07/20/2017 13:21   Ct Angio Neck W Or Wo Contrast  Result Date: 07/20/2017 CLINICAL DATA:  Embolic stroke on the left.  Left-sided neck pain. EXAM: CT ANGIOGRAPHY HEAD AND NECK TECHNIQUE: Multidetector CT imaging of the head and neck was performed using the standard protocol during bolus administration of intravenous contrast. Multiplanar CT image reconstructions and MIPs were obtained to evaluate the vascular anatomy. Carotid stenosis measurements (when applicable) are obtained utilizing NASCET criteria, using the distal internal carotid diameter as the denominator. CONTRAST:  50 cc Isovue 370 intravenous COMPARISON:  Brain MRI from earlier today. Intracranial  MRA 10/10/2016 FINDINGS: CTA NECK FINDINGS Aortic arch: Atheromatous changes. No visualized acute finding. Three vessel branching. Right carotid system: Mild to moderate degree of predominately calcified plaque at the common carotid bifurcation. No stenosis or ulceration. No beading or dissection. Left carotid system: No notable atheromatous changes for age. No stenosis, beading, or ulceration. Vertebral arteries: No proximal subclavian flow limiting stenosis or irregularity. Codominant vertebral arteries that are both smooth and widely patent to the dura Skeleton: No acute or aggressive finding. Other neck: No incidental mass or adenopathy in the neck. Nasal septal perforation. Upper chest: No acute finding. Increased size of mediastinal lymph nodes  compared to 2015 chest CT, including a 17 mm short axis right peritracheal node. Review of the MIP images confirms the above findings CTA HEAD FINDINGS Anterior circulation: Overall mild calcified plaque on the carotid siphons. Atherosclerotic irregularity of bilateral medium size vessels without branch occlusion. No reversible proximal stenosis. Questionable sub mm bulge at the right MCA bifurcation. Posterior circulation: Symmetric vertebral arteries with mild atheromatous change. Vertebral and basilar arteries are smooth and widely patent. Good proximal PCA patency. Fairly symmetric branch visualization. Negative for aneurysm. Venous sinuses: Patent Anatomic variants: None significant Delayed phase: No abnormal intracranial enhancement Review of the MIP images confirms the above findings IMPRESSION: 1. Known posterior circulation infarcts with no flow limiting stenosis or visible embolic source. 2. Mild for age atherosclerosis. 3. Mild mediastinal adenopathy that is increased from a chest CT in 2015. A diagnostic chest CT could further characterize. Electronically Signed   By: Monte Fantasia M.D.   On: 07/20/2017 13:21     Scheduled Meds: . cephALEXin  500 mg  Oral TID  . clopidogrel  75 mg Oral Daily  . enoxaparin (LOVENOX) injection  40 mg Subcutaneous Q24H  . insulin aspart  0-15 Units Subcutaneous TID WC  . insulin aspart  0-5 Units Subcutaneous QHS  . insulin regular human CONCENTRATED  15 Units Subcutaneous BID WC  . insulin regular human CONCENTRATED  70 Units Subcutaneous Q breakfast  . losartan  50 mg Oral Daily  . miconazole   Topical BID  . zolpidem  5 mg Oral QHS   Continuous Infusions:    LOS: 2 days   Time Spent in minutes   30 minutes  Lilya Smitherman U Harlyn Rathmann D.O. on 07/22/2017 at 12:49 PM    [facility] Office  [phone]

## 2017-07-22 NOTE — Progress Notes (Signed)
EEG completed, results pending. 

## 2017-07-22 NOTE — Procedures (Signed)
EEG Report  Clinical History:  Word finding difficulties and confusion/  Technical Summary:  A 19 channel digital EEG recording was performed using the 10-20 international system of electrode placement.  Bipolar and Referential montages were used.  The total recording time was approx 20 minutes.  Findings:  There is a posterior dominant rhythm of 10 Hz reactive to eye opening and closure.  No focal slowing is present.  There are no epileptiform discharges or electrographic seizures present.  Sleep was not recorded.    Impression:  This is a normal EEG in the awake state.   Isla Pence,  MD

## 2017-07-22 NOTE — Progress Notes (Signed)
Inpatient Diabetes Program Recommendations  AACE/ADA: New Consensus Statement on Inpatient Glycemic Control (2015)  Target Ranges:  Prepandial:   less than 140 mg/dL      Peak postprandial:   less than 180 mg/dL (1-2 hours)      Critically ill patients:  140 - 180 mg/dL   Results for SURI, TAFOLLA (MRN [id number]) as of 07/22/2017 10:10  Ref. Range 07/21/2017 06:17 07/21/2017 07:55 07/21/2017 10:44 07/21/2017 11:35 07/21/2017 16:33 07/21/2017 21:15 07/22/2017 06:29  Glucose-Capillary Latest Ref Range: 65 - 99 mg/dL 136 (H) 163 (H) 189 (H)  U500 70 units @ 10:46 219 (H)  U500 30 units @ 12:58 87 98  U500 15 units 01:01 on 07/22/17 178 (H)   Review of Glycemic Control  Diabetes history: DM2 Outpatient Diabetes medications: Humulin R U500 70 units with breakfast, Humulin R U500 30 units with lunch and at bedtime Current orders for Inpatient glycemic control: Humulin R U500 70 units with breakfast, Humulin R U500 15 units BID (with lunch and at bedtime), Novolog 0-15 units TID with meals, Novolog 0-5 units QHS  Inpatient Diabetes Program Recommendations:  Insulin: Noted Humulin R U500 dosages were changed. Agree with decrease in dosage. Recommend changing the Humulin R U500 15 units BID to be given with lunch and with supper. Humulin R U500 should not be taken at bedtime as it should be administer with meals.  Thanks, Barnie Alderman, RN, MSN, CDE Diabetes Coordinator Inpatient Diabetes Program [phone] (Team Pager from 8am to 5pm)

## 2017-07-22 NOTE — Progress Notes (Signed)
STROKE TEAM PROGRESS NOTE   SUBJECTIVE (INTERVAL HISTORY) No family is at bedside.  No acute event overnight.  No specific complaints.  Pending TEE and loop recorder on Monday.   OBJECTIVE Temp:  [98.2 F (36.8 C)-98.6 F (37 C)] 98.2 F (36.8 C) (10/21 0500) Pulse Rate:  [72-80] 78 (10/21 0500) Cardiac Rhythm: Heart block (10/21 0800) Resp:  [17-18] 18 (10/21 0500) BP: (117-159)/(38-55) 125/52 (10/21 0500) SpO2:  [94 %-98 %] 98 % (10/21 0500)  CBC:   Recent Labs Lab 07/19/17 1433 07/20/17 0010 07/21/17 0450  WBC 6.6  --  7.5  NEUTROABS  --  2.8  --   HGB 13.6  --  12.0  HCT 41.3  --  37.1  MCV 87.5  --  88.3  PLT 157  --  628    Basic Metabolic Panel:   Recent Labs Lab 07/19/17 1433 07/21/17 0450  NA 132* 136  K 4.4 4.1  CL 97* 105  CO2 26 24  GLUCOSE 503* 133*  BUN 23* 23*  CREATININE 0.75 0.92  CALCIUM 9.0 8.3*    Lipid Panel:     Component Value Date/Time   CHOL 109 07/20/2017 0450   CHOL 160 12/21/2014 1153   TRIG 136 07/20/2017 0450   TRIG 62 12/21/2014 1153   HDL 40 (L) 07/20/2017 0450   HDL 74 12/21/2014 1153   CHOLHDL 2.7 07/20/2017 0450   VLDL 27 07/20/2017 0450   LDLCALC 42 07/20/2017 0450   LDLCALC 74 12/21/2014 1153   HgbA1c:  Lab Results  Component Value Date   HGBA1C 10.9 (H) 07/20/2017   Urine Drug Screen:     Component Value Date/Time   LABOPIA NONE DETECTED 07/19/2017 1949   COCAINSCRNUR NONE DETECTED 07/19/2017 1949   LABBENZ NONE DETECTED 07/19/2017 1949   AMPHETMU NONE DETECTED 07/19/2017 1949   THCU NONE DETECTED 07/19/2017 1949   LABBARB NONE DETECTED 07/19/2017 1949    Alcohol Level     Component Value Date/Time   ETH <10 07/19/2017 1433    IMAGING I have personally reviewed the radiological images below and agree with the radiology interpretations.  Dg Chest 2 View 07/19/2017 1. Borderline to mild cardiomegaly with mild central congestion.  2. Subsegmental atelectasis at the left base   Ct Head Wo  Contrast 07/19/2017 IMPRESSION:  1.  No acute intracranial abnormality.  2. Stable non contrast CT appearance of the brain since June, with mild for age chronic white matter changes.   [name] Head Wo Contrast 07/20/2017 IMPRESSION:  1. Several subcentimeter foci of acute/early subacute infarction are present within left cerebellar hemisphere, left thalamus, and left parietal subcortical white matter. No associated mass effect or hemorrhage. Multiple vascular distributions favors embolic etiology.  2. Stable background of mild chronic microvascular ischemic changes and parenchymal volume loss of the brain.  3. Patent circle of Willis. No high-grade stenosis, large vessel occlusion, or aneurysm identified.   CT Angio Head and Neck 07/20/2017 IMPRESSION: 1. Known posterior circulation infarcts with no flow limiting stenosis or visible embolic source. 2. Mild for age atherosclerosis. 3. Mild mediastinal adenopathy that is increased from a chest CT in 2015. A diagnostic chest CT could further characterize.  LE venous Doppler - no DVT  TTE  07/21/2017 Study Conclusions - Left ventricle: The cavity size was normal. There was moderate   concentric hypertrophy. Systolic function was normal. The   estimated ejection fraction was in the range of 60% to 65%. Wall   motion was normal;  there were no regional wall motion   abnormalities. Doppler parameters are consistent with abnormal   left ventricular relaxation (grade 1 diastolic dysfunction).   Doppler parameters are consistent with high ventricular filling   pressure. - Aortic valve: Transvalvular velocity was within the normal range.   There was no stenosis. There was no regurgitation. Valve area   (Vmax): 1.68 cm^2. - Mitral valve: Severely calcified annulus. The findings are   consistent with moderate stenosis. There was no regurgitation.   Valve area by pressure half-time: 1.95 cm^2. Valve area by   continuity equation (using LVOT  flow): 0.6 cm^2. - Left atrium: The atrium was severely dilated. - Right ventricle: The cavity size was normal. Wall thickness was   normal. Systolic function was normal. - Atrial septum: No defect or patent foramen ovale was identified. - Tricuspid valve: There was trivial regurgitation. - Pulmonary arteries: Systolic pressure was within the normal   range. PA peak pressure: 34 mm Hg (S).  TEE pending  EEG pending   PHYSICAL EXAM Vitals:   07/21/17 1740 07/21/17 2100 07/22/17 0134 07/22/17 0500  BP: (!) 127/38 (!) 135/53 (!) 159/55 (!) 125/52  Pulse: 78 80 72 78  Resp: 18 18 18 18   Temp: 98.5 F (36.9 C) 98.5 F (36.9 C) 98.5 F (36.9 C) 98.2 F (36.8 C)  TempSrc: Oral Oral Oral Oral  SpO2: 96% 95% 97% 98%  Weight:      Height:        PHYSICAL EXAM Gen: Obesity, NAD Eyes: anicteric sclerae, moist conjunctivae                    CV: no MRG, no carotid bruits, no peripheral edema Neuro: Mental Status: Alert, follows commands, good historian No aphasia, no dysarthria  Cranial Nerves:   The pupils are equal, round, and reactive to light.. Attempted, Fundi not visualized.  EOMI. No gaze preference. Visual fields full. Face symmetric, Tongue midline. Hearing intact to voice. Shoulder shrug intact  Motor Observation:    no involuntary movements noted. Tone appears normal.     Strength:    5/5     Sensation:  Intact to LT  Plantars downgoing.    ASSESSMENT/PLAN Melanie Mcdonald is a 83 y.o. female with history of TIAs, PVD, narcolepsy, HTN, HLD, DM, depression, CAD, COPD and chronic pain presenting with word finding problems, weakness and confusion. She did not receive IV t-PA due to late presentation.  Strokes:  several punctate left cerebellum, left thalamus, left MCA/PCA and left MCA/ACA infarcts, embolic - source unknown.   Resultant deficit resolved  CT head - No acute intracranial abnormality.   MRI head - Several subcentimeter foci of acute/early  subacute infarction are present within left cerebellar hemisphere, left thalamus, and left parietal subcortical white matter  MRA head - unremarkable.   CTA H&N - no flow limiting stenosis or visible embolic source.  Bilateral LE dopplers - no DVT.   2D Echo - EF 60-65%. The left atrium was severely dilated.  TEE and loop recorder pending  EEG pending  LDL - 42  HgbA1c - 10.9  VTE prophylaxis - Lovenox Diet NPO time specified Except for: Sips with Meds Diet Carb Modified Fluid consistency: Thin; Room service appropriate? Yes  No antithrombotic prior to admission, now on clopidogrel 75 mg daily - Reported aspirin allergy, continue Plavix on discharge  Patient counseled to be compliant with her antithrombotic medications  Ongoing aggressive stroke risk factor management  Therapy  recommendations: SNF  Disposition: Pending  History of TIA  10/2016 -slurred speech -MRI no acute infarct, MRA, carotid Doppler, 2D echo, LE venous Doppler all negative -LDL 61 and A1c 9.6 -put on aspirin 81  Hypertension  Stable  Permissive hypertension (OK if < 220/120) but gradually normalize in 5-7 days  Long-term BP goal normotensive  Diabetes  HgbA1c 10.9, goal < 7.0  Uncontrolled  On insulin   SSI  CBG monitoring  Close PCP follow-up for better DM control  Other Stroke Risk Factors  Advanced age  Obesity, Body mass index is 35.47 kg/m., recommend weight loss, diet and exercise as appropriate   Coronary artery disease  Other Active Problems  Mild hyponatremia - 132  Hypoglycemia - poor PO intake - diabetes coordinator consult pending.   Reported aspirin allergy  RLS on Mirapex  Follows with Dr. Delice Lesch at [facility] day # 2  Rosalin Hawking, MD PhD Stroke Neurology 07/22/2017 12:37 PM   To contact Stroke Continuity provider, please refer to [email or web address] After hours, contact General Neurology

## 2017-07-23 ENCOUNTER — Encounter (HOSPITAL_COMMUNITY): Payer: Self-pay | Admitting: *Deleted

## 2017-07-23 ENCOUNTER — Inpatient Hospital Stay (HOSPITAL_COMMUNITY): Payer: Medicare Other

## 2017-07-23 ENCOUNTER — Encounter (HOSPITAL_COMMUNITY)
Admission: EM | Disposition: A | Discharge status: Skilled Nursing Facility | Payer: Self-pay | Source: Home / Self Care | Attending: Internal Medicine

## 2017-07-23 DIAGNOSIS — I639 Cerebral infarction, unspecified: Secondary | ICD-10-CM

## 2017-07-23 HISTORY — PX: LOOP RECORDER INSERTION: EP1214

## 2017-07-23 LAB — BASIC METABOLIC PANEL
Anion gap: 8 (ref 5–15)
BUN: 18 mg/dL (ref 6–20)
CHLORIDE: 106 mmol/L (ref 101–111)
CO2: 23 mmol/L (ref 22–32)
CREATININE: 0.69 mg/dL (ref 0.44–1.00)
Calcium: 8.5 mg/dL — ABNORMAL LOW (ref 8.9–10.3)
GFR calc Af Amer: >60 mL/min (ref >60)
Glucose, Bld: 159 mg/dL — ABNORMAL HIGH (ref 65–99)
POTASSIUM: 4.4 mmol/L (ref 3.5–5.1)
SODIUM: 137 mmol/L (ref 135–145)

## 2017-07-23 LAB — GLUCOSE, CAPILLARY
GLUCOSE-CAPILLARY: 205 mg/dL — AB (ref 65–99)
GLUCOSE-CAPILLARY: 243 mg/dL — AB (ref 65–99)
GLUCOSE-CAPILLARY: 214 mg/dL — AB (ref 65–99)
Glucose-Capillary: 173 mg/dL — ABNORMAL HIGH (ref 65–99)
Glucose-Capillary: 221 mg/dL — ABNORMAL HIGH (ref 65–99)
Glucose-Capillary: 183 mg/dL — ABNORMAL HIGH (ref 65–99)

## 2017-07-23 LAB — CBC
HEMATOCRIT: 40.4 % (ref 36.0–46.0)
HEMOGLOBIN: 13 g/dL (ref 12.0–15.0)
MCH: 28.7 pg (ref 26.0–34.0)
MCHC: 32.2 g/dL (ref 30.0–36.0)
MCV: 89.2 fL (ref 78.0–100.0)
Platelets: 164 10*3/uL (ref 150–400)
RBC: 4.53 MIL/uL (ref 3.87–5.11)
RDW: 14.2 % (ref 11.5–15.5)
WBC: 6.3 10*3/uL (ref 4.0–10.5)

## 2017-07-23 SURGERY — LOOP RECORDER INSERTION

## 2017-07-23 SURGERY — INVASIVE LAB ABORTED CASE

## 2017-07-23 MED ORDER — DIPHENHYDRAMINE HCL 50 MG/ML IJ SOLN
INTRAMUSCULAR | Status: AC
  Filled 2017-07-23: qty 1

## 2017-07-23 MED ORDER — LIDOCAINE VISCOUS 2 % MT SOLN
OROMUCOSAL | Status: AC
  Filled 2017-07-23: qty 15

## 2017-07-23 MED ORDER — HYDRALAZINE HCL 20 MG/ML IJ SOLN
INTRAMUSCULAR | Status: AC
  Filled 2017-07-23: qty 1

## 2017-07-23 MED ORDER — SODIUM CHLORIDE 0.9 % IV SOLN
INTRAVENOUS | Status: DC
  Administered 2017-07-23: 09:00:00 via INTRAVENOUS

## 2017-07-23 MED ORDER — FENTANYL CITRATE (PF) 100 MCG/2ML IJ SOLN
INTRAMUSCULAR | Status: AC
  Filled 2017-07-23: qty 2

## 2017-07-23 MED ORDER — MIDAZOLAM HCL 5 MG/ML IJ SOLN
INTRAMUSCULAR | Status: AC
  Filled 2017-07-23: qty 2

## 2017-07-23 MED ORDER — MIDAZOLAM HCL 10 MG/2ML IJ SOLN
INTRAMUSCULAR | Status: DC | PRN
  Administered 2017-07-23 (×4): 2 mg via INTRAVENOUS

## 2017-07-23 MED ORDER — LIDOCAINE-EPINEPHRINE 1 %-1:100000 IJ SOLN
INTRAMUSCULAR | Status: AC
  Filled 2017-07-23: qty 1

## 2017-07-23 MED ORDER — HYDRALAZINE HCL 20 MG/ML IJ SOLN
INTRAMUSCULAR | Status: DC | PRN
  Administered 2017-07-23: 10 mg via INTRAVENOUS

## 2017-07-23 MED ORDER — LIDOCAINE-EPINEPHRINE 1 %-1:100000 IJ SOLN
INTRAMUSCULAR | Status: DC | PRN
  Administered 2017-07-23: 10 mL

## 2017-07-23 MED ORDER — ACETAMINOPHEN 325 MG PO TABS: 325.0000 mg | ORAL_TABLET | ORAL | Status: DC | PRN

## 2017-07-23 MED ORDER — ONDANSETRON HCL 4 MG/2ML IJ SOLN: 4.0000 mg | Freq: Four times a day (QID) | INTRAMUSCULAR | Status: DC | PRN

## 2017-07-23 MED ORDER — BUTAMBEN-TETRACAINE-BENZOCAINE 2-2-14 % EX AERO
INHALATION_SPRAY | CUTANEOUS | Status: DC | PRN
  Administered 2017-07-23: 2 via TOPICAL

## 2017-07-23 MED ORDER — FENTANYL CITRATE (PF) 100 MCG/2ML IJ SOLN
INTRAMUSCULAR | Status: DC | PRN
  Administered 2017-07-23 (×2): 25 ug via INTRAVENOUS

## 2017-07-23 SURGICAL SUPPLY — 2 items
LOOP REVEAL LINQSYS (Prosthesis & Implant Heart) ×2 IMPLANT
PACK LOOP INSERTION (CUSTOM PROCEDURE TRAY) ×3 IMPLANT

## 2017-07-23 NOTE — CV Procedure (Signed)
TEE cancelled. The patient obtained 8 mg of iv Versed and 50 mcg of iv Fentanyl, still wide awake and pulling out the probe. The case will be rescheduled with anesthesia.   Ena Dawley, MD 07/23/2017

## 2017-07-23 NOTE — Discharge Instructions (Signed)
Heart monitor implant site care Keep incision clean and dry for 3 days. You can remove outer dressing tomorrow. Leave steri-strips (little pieces of tape) on until seen in the office for wound check appointment. Call the office [phone]) for redness, drainage, swelling, or fever.

## 2017-07-23 NOTE — Consult Note (Signed)
ELECTROPHYSIOLOGY CONSULT NOTE  Patient ID: Melanie Mcdonald MRN: [id number], DOB/AGE: July 19, 1933   Admit date: 07/19/2017 Date of Consult: 07/23/2017  Primary Physician: Laurey Morale, MD Primary Cardiologist: Dr. Meda Coffee (last in 2016) Reason for Consultation: Cryptogenic stroke ; recommendations regarding Implantable Loop Recorder  History of Present Illness Melanie Mcdonald was admitted on 07/19/2017 with confusion, word finding difficulties, BS was noted 503 on admission.  PMHx noted for HTN, HLD, TIA's, obesity, narcolepsy, diastolic CHF, LVH, COPD, DM, falls.  She first developed symptoms while while at home waxing/waning over a couple days.  Imaging demonstrated per neurology,several punctate left cerebellum, left thalamus, left MCA/PCA and left MCA/ACA infarcts, embolic - source unknown.   she has undergone workup for stroke including echocardiogram and carotid angio.  The patient has been monitored on telemetry which has demonstrated sinus rhythm with no arrhythmias.  Inpatient stroke work-up is to be completed with a TEE.   Echocardiogram this admission demonstrated  TTE  07/21/2017 Study Conclusions - Left ventricle: The cavity size was normal. There was moderate concentric hypertrophy. Systolic function was normal. The estimated ejection fraction was in the range of 60% to 65%. Wall motion was normal; there were no regional wall motion abnormalities. Doppler parameters are consistent with abnormal left ventricular relaxation (grade 1 diastolic dysfunction). Doppler parameters are consistent with high ventricular filling pressure. - Aortic valve: Transvalvular velocity was within the normal range. There was no stenosis. There was no regurgitation. Valve area (Vmax): 1.68 cm^2. - Mitral valve: Severely calcified annulus. The findings are consistent with moderate stenosis. There was no regurgitation. Valve area by pressure half-time: 1.95 cm^2. Valve  area by continuity equation (using LVOT flow): 0.6 cm^2. - Left atrium: The atrium was severely dilated. - Right ventricle: The cavity size was normal. Wall thickness was normal. Systolic function was normal. - Atrial septum: No defect or patent foramen ovale was identified. - Tricuspid valve: There was trivial regurgitation. - Pulmonary arteries: Systolic pressure was within the normal range. PA peak pressure: 34 mm Hg (S).  Lab work is reviewed.  Prior to admission, the patient denies  shortness of breath, dizziness, palpitations, or syncope.   She does mention some infrequent CP that she has felt was GI/"Gas". She is recovering from their stroke with plans to SNF at discharge.  EP has been asked to evaluate for placement of an implantable loop recorder to monitor for atrial fibrillation by Dr. Erlinda Hong.     Past Medical History:  Diagnosis Date  . ANEMIA 01/30/2008  . ARTHRITIS 12/11/2007  . BACK PAIN, CHRONIC 06/23/2008  . BREAST CYST, RIGHT 12/17/2007  . Colon polyps    FRAGMENTS OF HYPERPLASTIC POLYP  . CONTACT DERMATITIS 03/10/2009  . COPD (chronic obstructive pulmonary disease) ([place])   . CORONARY ARTERY DISEASE 03/01/2007   had a normal Myoview stress test 07-13-11  . CYSTOCELE WITHOUT MENTION UTERINE PROLAPSE LAT 09/12/2010  . DEGENERATIVE JOINT DISEASE 08/07/2007  . DEPRESSION 03/01/2007  . DIABETES MELLITUS, TYPE II 08/26/2008   sees Dr. Buddy Duty   . Diverticulosis of colon (without mention of hemorrhage)   . Esophageal reflux 11/06/2007  . HYPERKERATOSIS 06/02/2009  . HYPERLIPIDEMIA 08/26/2008  . HYPERTENSION 03/01/2007  . Irritable bowel syndrome 01/26/2009  . LABYRINTHITIS 11/03/2009  . 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 NEPHROLITHIASIS 04/29/2009  . OBESITY 03/01/2007  . PERIPHERAL VASCULAR DISEASE 01/26/2009  . SYNCOPE 08/26/2008   had brain MRI on 06-28-12 showing only chronic microvascular ischemia and atrophy   .  TRANSIENT ISCHEMIC ATTACK 10/20/2009   had  normal brain MRA with patent vertebrals and carotids 06-28-12     Surgical History:  Past Surgical History:  Procedure Laterality Date  . ABDOMINAL HYSTERECTOMY    . BACK SURGERY     x2  . CARDIAC CATHETERIZATION  07/2009  . CATARACT EXTRACTION    . COLONOSCOPY  11-13-07   per Dr. Deatra Ina, benign polyps, repeat in 5 yrs  . ESOPHAGOGASTRODUODENOSCOPY  11-13-07   per Dr. Deatra Ina, normal   . INTRAOCULAR LENS INSERTION    . SPINE SURGERY     x 3  . TONSILECTOMY, ADENOIDECTOMY, BILATERAL MYRINGOTOMY AND TUBES       Prescriptions Prior to Admission  Medication Sig Dispense Refill Last Dose  . albuterol (PROVENTIL HFA;VENTOLIN HFA) 108 (90 Base) MCG/ACT inhaler Inhale 2 puffs into the lungs every 4 (four) hours as needed for wheezing or shortness of breath. 1 Inhaler 3 PRN at PRN  . benzonatate (TESSALON) 200 MG capsule Take 200 mg by mouth 2 (two) times daily as needed for cough.   PRN at PRN  . Camphor-Eucalyptus-Menthol (VICKS VAPORUB EX) Apply 1 application topically daily as needed (for congestion).   PRN at PRN  . cephALEXin (KEFLEX) 500 MG capsule Take 500 mg by mouth 3 (three) times daily. FOR 10 DAYS  2 07/19/2017 at am  . dextroamphetamine (DEXEDRINE SPANSULE) 10 MG 24 hr capsule Take 20 mg by mouth daily as needed (as needed for alertness/prior to driving for narcolepsy).   PRN at PRN  . ENSURE (ENSURE) Take 237 mLs by mouth 4 (four) times daily. Reported on 02/08/2016   07/19/2017 at am  . furosemide (LASIX) 40 MG tablet Take 1 tablet (40 mg total) by mouth 2 (two) times daily. TAKE 1 IN THE MORNING AND ONE IN THE EVENING (Patient taking differently: Take 40 mg by mouth 2 (two) times daily as needed for fluid or edema (in the legs/feet). 40 mg two times a day as needed for edema or fluid) 60 tablet 11 Past Week at Unknown time  . gabapentin (NEURONTIN) 300 MG capsule Take 1 capsule (300 mg total) by mouth 3 (three) times daily. (Patient taking differently: Take 300 mg by mouth daily as  needed (for nerve pain in the feet). ) 90 capsule 5 07/18/2017 at pm  . Glucos-Chond-Hyal Ac-Ca Fructo (MOVE FREE JOINT HEALTH ADVANCE) TABS Take 1 tablet by mouth 2 (two) times daily.   Past Week at Unknown time  . insulin regular human CONCENTRATED (HUMULIN R U-500 KWIKPEN) 500 UNIT/ML kwikpen Take 60units at breakfast 50 at lunch and 50 at supper (Patient taking differently: Inject 30-70 Units into the skin See admin instructions. 70 units before breakfast then 30 units before lunch then 30 units at bedtime) 1 pen 12 07/18/2017 at pm  . ketoconazole (NIZORAL) 2 % cream Apply 1 application topically 2 (two) times daily as needed for irritation (between bikini lines).   PRN at PRN  . nitroGLYCERIN (NITROSTAT) 0.4 MG SL tablet Place 1 tablet (0.4 mg total) under the tongue every 5 (five) minutes as needed for chest pain. 50 tablet 5 Past Month at Unknown time  . ondansetron (ZOFRAN) 8 MG tablet Take 1 tablet (8 mg total) by mouth every 8 (eight) hours as needed for nausea or vomiting. 60 tablet 5 PRN at PRN  . OVER THE COUNTER MEDICATION [employer] caplets for Leg Cramps (Cinchona Off. 3X HPUS; Viscum Alb. 3X HPUS; Gnaphalium Poly. 3X HPUS; Rhus Tox. 6X HPUS;  Aconitum Nap. 6X HPUS; Ledum Pal. 6X HPUS; Magnesia Phos. 6X HPUS. Inactive Ingredients: Lactose NF, Microcrystalline Cellulose, and Vegetable Magnesium Stearate): Take 2 caplets by mouth up to four times a day as needed for leg cramps   Past Week at Unknown time  . oxyCODONE-acetaminophen (PERCOCET) 10-325 MG tablet Take 1 tablet by mouth every 6 (six) hours as needed for pain. 120 tablet 0 07/18/2017 at pm  . phenazopyridine (AZO-TABS) 95 MG tablet Take 95 mg by mouth 3 (three) times daily as needed for pain.   PRN at PRN  . pramipexole (MIRAPEX) 1 MG tablet TAKE 5mg =5 TABLETS BY MOUTH daily at bedtime (Patient taking differently: Take 4-5 mg by mouth at bedtime as needed (for restless legs). ) 540 tablet 1 07/18/2017 at pm  . zolpidem (AMBIEN) 10 MG  tablet TAKE 1 TABLET BY MOUTH AT BEDTIME (Patient taking differently: Take 10 mg by mouth at bedtime) 30 tablet 5 07/18/2017 at pm  . glucose blood (ACCU-CHEK AVIVA) test strip Test once per day and diagnosis code is E 11.9 100 each 1 Unk at Unk  . hydrocortisone (CORTEF) 5 MG tablet Take 5 mg by mouth daily. WITH FOOD OR MILK  5 Not yet at Not yet  . losartan (COZAAR) 50 MG tablet Take 1 tablet (50 mg total) by mouth daily. 90 tablet 3 Not yet at Not yet    Inpatient Medications:  . cephALEXin  500 mg Oral TID  . clopidogrel  75 mg Oral Daily  . enoxaparin (LOVENOX) injection  40 mg Subcutaneous Q24H  . insulin aspart  0-15 Units Subcutaneous TID WC  . insulin aspart  0-5 Units Subcutaneous QHS  . insulin regular human CONCENTRATED  15 Units Subcutaneous BID WC  . insulin regular human CONCENTRATED  70 Units Subcutaneous Q breakfast  . losartan  50 mg Oral Daily  . miconazole   Topical BID  . zolpidem  5 mg Oral QHS    Allergies:  Allergies  Allergen Reactions  . Aspirin Other (See Comments)    Abdominal pain  . Ciprofloxacin Nausea And Vomiting    Made pt very sick on stomach  . Clarithromycin     Made stomach "burn"  . Doxycycline Itching  . Erythromycin Nausea And Vomiting  . Lisinopril Cough  . Lyrica [Pregabalin] Other (See Comments)    Suicidal   . Macrobid [Nitrofurantoin Monohyd Macro] Nausea And Vomiting  . Metolazone Other (See Comments)    Pt stated this made her B/P drop  . Other Nausea And Vomiting    Pt cannot have any of the -mycin(s)  . Penicillins Swelling    From childhood: swelling @ injection site Has patient had a PCN reaction causing immediate rash, facial/tongue/throat swelling, SOB or lightheadedness with hypotension: Yes Has patient had a PCN reaction causing severe rash involving mucus membranes or skin necrosis: No Has patient had a PCN reaction that required hospitalization: No Has patient had a PCN reaction occurring within the last 10 years:  No If all of the above answers are "NO", then may proceed with Cephalosporin use.   . Sulfonamide Derivatives Nausea And Vomiting  . Valtrex [Valacyclovir Hcl] Nausea Only    Social History   Social History  . Marital status: Widowed    Spouse name: N/A  . Number of children: N/A  . Years of education: N/A   Occupational History  . Not on file.   Social History Main Topics  . Smoking status: Never Smoker  . Smokeless  tobacco: Never Used  . Alcohol use No  . Drug use: No  . Sexual activity: No   Other Topics Concern  . Not on file   Social History Narrative  . No narrative on file     Family History  Problem Relation Age of Onset  . Lupus Mother   . COPD Father       Review of Systems: All other systems reviewed and are otherwise negative except as noted above.  Physical Exam: Vitals:   07/22/17 2151 07/23/17 0151 07/23/17 0617 07/23/17 0655  BP: (!) 146/61 (!) 136/53 (!) 171/67 (!) 150/60  Pulse: 71 71 82 80  Resp: 18 18 18    Temp: 97.9 F (36.6 C) 97.9 F (36.6 C) 98.1 F (36.7 C)   TempSrc: Oral Oral Oral   SpO2: 97% 96% 98%   Weight:      Height:        GEN- The patient is well appearing, alert and oriented x 3 today.   Head- normocephalic, atraumatic Eyes-  Sclera clear, conjunctiva pink Ears- hearing intact Oropharynx- clear Neck- supple Lungs- CTA b/l, normal work of breathing Heart- RRR, no murmurs, rubs or gallops  GI- soft, NT, ND Extremities- no clubbing, cyanosis, or edema MS- no significant deformity or atrophy Skin- no rash or lesion Psych- euthymic mood, full affect   Labs:   Lab Results  Component Value Date   WBC 6.3 07/23/2017   HGB 13.0 07/23/2017   HCT 40.4 07/23/2017   MCV 89.2 07/23/2017   PLT 164 07/23/2017    Recent Labs Lab 07/20/17 0010  07/23/17 0307  NA  --   < > 137  K  --   < > 4.4  CL  --   < > 106  CO2  --   < > 23  BUN  --   < > 18  CREATININE  --   < > 0.69  CALCIUM  --   < > 8.5*  PROT 7.0   --   --   BILITOT 0.5  --   --   ALKPHOS 84  --   --   ALT 14  --   --   AST 14*  --   --   GLUCOSE  --   < > 159*  < > = values in this interval not displayed. Lab Results  Component Value Date   CKTOTAL 67 10/10/2016   CKMB 1.6 07/23/2009   TROPONINI <0.03 07/20/2017   Lab Results  Component Value Date   CHOL 109 07/20/2017   CHOL 128 02/09/2017   CHOL 125 10/10/2016   Lab Results  Component Value Date   HDL 40 (L) 07/20/2017   HDL 52.20 02/09/2017   HDL 48 10/10/2016   Lab Results  Component Value Date   LDLCALC 42 07/20/2017   LDLCALC 63 02/09/2017   LDLCALC 61 10/10/2016   Lab Results  Component Value Date   TRIG 136 07/20/2017   TRIG 61.0 02/09/2017   TRIG 80 10/10/2016   Lab Results  Component Value Date   CHOLHDL 2.7 07/20/2017   CHOLHDL 2 02/09/2017   CHOLHDL 2.6 10/10/2016   No results found for: LDLDIRECT  Lab Results  Component Value Date   DDIMER 0.50 (H) 02/13/2014     Radiology/Studies:   Ct Angio Head W Or Wo Contrast Result Date: 07/20/2017 CLINICAL DATA:  Embolic stroke on the left.  Left-sided neck pain. EXAM: CT ANGIOGRAPHY HEAD AND NECK TECHNIQUE: Multidetector CT  imaging of the head and neck was performed using the standard protocol during bolus administration of intravenous contrast. Multiplanar CT image reconstructions and MIPs were obtained to evaluate the vascular anatomy. Carotid stenosis measurements (when applicable) are obtained utilizing NASCET criteria, using the distal internal carotid diameter as the denominator. CONTRAST:  50 cc Isovue 370 intravenous COMPARISON:  Brain MRI from earlier today. Intracranial MRA 10/10/2016 FINDINGS: CTA NECK FINDINGS Aortic arch: Atheromatous changes. No visualized acute finding. Three vessel branching. Right carotid system: Mild to moderate degree of predominately calcified plaque at the common carotid bifurcation. No stenosis or ulceration. No beading or dissection. Left carotid system: No  notable atheromatous changes for age. No stenosis, beading, or ulceration. Vertebral arteries: No proximal subclavian flow limiting stenosis or irregularity. Codominant vertebral arteries that are both smooth and widely patent to the dura Skeleton: No acute or aggressive finding. Other neck: No incidental mass or adenopathy in the neck. Nasal septal perforation. Upper chest: No acute finding. Increased size of mediastinal lymph nodes compared to 2015 chest CT, including a 17 mm short axis right peritracheal node. Review of the MIP images confirms the above findings CTA HEAD FINDINGS Anterior circulation: Overall mild calcified plaque on the carotid siphons. Atherosclerotic irregularity of bilateral medium size vessels without branch occlusion. No reversible proximal stenosis. Questionable sub mm bulge at the right MCA bifurcation. Posterior circulation: Symmetric vertebral arteries with mild atheromatous change. Vertebral and basilar arteries are smooth and widely patent. Good proximal PCA patency. Fairly symmetric branch visualization. Negative for aneurysm. Venous sinuses: Patent Anatomic variants: None significant Delayed phase: No abnormal intracranial enhancement Review of the MIP images confirms the above findings IMPRESSION: 1. Known posterior circulation infarcts with no flow limiting stenosis or visible embolic source. 2. Mild for age atherosclerosis. 3. Mild mediastinal adenopathy that is increased from a chest CT in 2015. A diagnostic chest CT could further characterize. Electronically Signed   By: Monte Fantasia M.D.   On: 07/20/2017 13:21    Dg Chest 2 View Result Date: 07/19/2017 CLINICAL DATA:  TIA EXAM: CHEST  2 VIEW COMPARISON:  10/09/2016 FINDINGS: Subsegmental atelectasis at the left base. Borderline to mild cardiomegaly with minimal central congestion. No consolidation or effusion. Aortic atherosclerosis. No pneumothorax. Degenerative changes of the spine. IMPRESSION: 1. Borderline to mild  cardiomegaly with mild central congestion. 2. Subsegmental atelectasis at the left base Electronically Signed   By: Donavan Foil M.D.   On: 07/19/2017 23:59    Ct Head Wo Contrast Result Date: 07/19/2017 CLINICAL DATA:  83 year old female with progressive weakness for 1 week. EXAM: CT HEAD WITHOUT CONTRAST TECHNIQUE: Contiguous axial images were obtained from the base of the skull through the vertex without intravenous contrast. COMPARISON:  Head CT without contrast 03/30/2017. Brain MRI 10/10/2016, and earlier. FINDINGS: Brain: Cerebral volume remains normal for age. Stable mild for age cerebral white matter hypodensity. No midline shift, ventriculomegaly, mass effect, evidence of mass lesion, intracranial hemorrhage or evidence of cortically based acute infarction. Vascular: Calcified atherosclerosis at the skull base. No suspicious intracranial vascular hyperdensity. Skull: No acute osseous abnormality identified. Sinuses/Orbits: Visualized paranasal sinuses and mastoids are stable and well pneumatized. Other: No acute orbit or scalp soft tissue findings. IMPRESSION: 1.  No acute intracranial abnormality. 2. Stable non contrast CT appearance of the brain since June, with mild for age chronic white matter changes. Electronically Signed   By: Genevie Ann M.D.   On: 07/19/2017 16:21    Mr Brain Wo Contrast Result Date: 07/20/2017 CLINICAL  DATA:  83 y/o F; weakness, slurred speech, word-finding difficulty. Mild headache. EXAM: MRI HEAD WITHOUT CONTRAST MRA HEAD WITHOUT CONTRAST TECHNIQUE: Multiplanar, multiecho pulse sequences of the brain and surrounding structures were obtained without intravenous contrast. Angiographic images of the head were obtained using MRA technique without contrast. COMPARISON:  07/19/2017 CT of the head.  10/10/2016 MRI of the head. FINDINGS: MRI HEAD FINDINGS Brain: Subcentimeter foci of reduced diffusion compatible with acute/early subacute infarction are present within the left  inferomedial and superolateral cerebellar hemisphere, left ventrolateral thalamus, and scattered foci are present left parietal subcortical white matter. (Series 3: Image 12, 16, 25, 31, 35). Foci of acute/early subacute infarction demonstrate associated T2 FLAIR hyperintense signal abnormality. No significant mass effect. No abnormal susceptibility hypointensity to indicate intracranial hemorrhage. Stable background of scattered nonspecific foci of T2 FLAIR hyperintensity in subcortical and periventricular white matter is compatible with mild chronic microvascular ischemic changes for age and there is mild brain parenchymal volume loss. No hydrocephalus, extra-axial collection, effacement of basilar cisterns, or herniation. Vascular: As below. Skull and upper cervical spine: Normal marrow signal. Sinuses/Orbits: Mild anterior ethmoid sinus mucosal thickening. Otherwise negative. Other: Bilateral intra-ocular lens replacement. MRA HEAD FINDINGS Internal carotid arteries:  Patent. Anterior cerebral arteries:  Patent. Middle cerebral arteries: Patent. Anterior communicating artery: Patent. Posterior communicating arteries: Patent left. No right identified, likely hypoplastic or absent. Posterior cerebral arteries:  Patent. Basilar artery:  Patent. Vertebral arteries:  Patent. No evidence of high-grade stenosis, large vessel occlusion, or aneurysm identified. IMPRESSION: 1. Several subcentimeter foci of acute/early subacute infarction are present within left cerebellar hemisphere, left thalamus, and left parietal subcortical white matter. No associated mass effect or hemorrhage. Multiple vascular distributions favors embolic etiology. 2. Stable background of mild chronic microvascular ischemic changes and parenchymal volume loss of the brain. 3. Patent circle of Willis. No high-grade stenosis, large vessel occlusion, or aneurysm identified. These results will be called to the ordering clinician or representative by the  Radiologist Assistant, and communication documented in the PACS or zVision Dashboard. Electronically Signed   By: Kristine Garbe M.D.   On: 07/20/2017 00:08    12-lead ECG SR All prior EKG's in EPIC reviewed with no documented atrial fibrillation  Telemetry SR  Assessment and Plan:  1. Cryptogenic stroke The patient presents with cryptogenic stroke.  The patient went for TEE this AM, though they were unable to sedate her adequately and is rescheduled for anesthesia possibly tomorrow AM.  I spoke at length with the patient about monitoring for afib with either a 30 day event monitor or an implantable loop recorder.  Risks, benefits, and alteratives to implantable loop recorder were discussed with the patient today.   At this time, the patient is somewhat hesitant and undecided about any of the monitoring, she is AAO x3, though remains sleepy    ADDEND: Came back and spoke with the patient (and her son at bedside now).  The patient did recall our conversation from earlier today.  We revisited rational of cryptogenic stroke and heart rhythm monitoring with either 30 days EM or implantable loop monitor.  The implant procedure and its risks/benefits were re discussed as well.  The patient is clear, she would like to pursue loop implant.  She is able to restate the points we discussed accurately and states understanding.  We will plan for this afternoon, it appears plans for discharged to SNF today.  Wound care and follow up were discussed with the patient.   Renee Dyane Dustman,  PA-C 07/23/2017   EP attending  Patient seen and examined. Agree with the findings as documented above. The patient has had a cryptogenic stroke, with no obvious explanation. I discussed the treatment options including insertion of an implantable loop recorder and she wishes to proceed.  Cristopher Peru, M.D.

## 2017-07-23 NOTE — H&P (View-Only) (Signed)
PROGRESS NOTE    Melanie Mcdonald  RCV:[id number] DOB: 16-Feb-1933 DOA: 07/19/2017 PCP: Laurey Morale, MD   Chief Complaint  Patient presents with  . Weakness    Brief Narrative:  HPI on 07/19/2017 by Dr. [name] Melanie Mcdonald is a 83 y.o. female with medical history significant of TIA; PVD; narcolepsy; HTN; HLD; DM; depression; CAD; COPD; and chronic pain presenting due to "Strokish".  Her son noticed that she was different since Saturday.  She gets confused and can't talk.  She knows what she wants to say but can't get the words out.  Her symptoms are intermittent, but she has been getting chronically weaker the whole time. She had a mini-stroke 4-5 times in the last 3-4 years.  She had one Saturday and it just keeps coming back.  Most times she relaxes and calms down and within 3-4 hours it goes away but this time it kept coming back.    Assessment & Plan   Acute CVA -CT head showed no acute abnormality -MRI/MRA brain: Several subcentimeter foci of acute/early subacute infarction within left cerebellar hemisphere, left thalamus, left parietal subcortical white matter. Patent circle of Willis, no high-grade stenosis, large vessel occlusion or aneurysm identified -CTA neck: Known posterior circulation infarcts with no flow-limiting stenosis or visible embolic source. -Neurology consulted and appreciated; discussed with Dr. Jaynee Eagles, recommended TEE -Hemoglobin A1c 10.9, LDL 42 -Cardiology notified of TEE- plan for Monday along with Loop recorder -Echocardiogram: normal EF with mitral valve stenosis -PT recommended SNF -Continue Plavix  Essential Hypertension -Cozaar held initally, restart in AM if BP high  Hyperlipidemia -Lipid panel: Total cholesterol 109, HDL 40, LDL 42, triglycerides 136 -Currently on no cholesterol medications  Diabetes mellitus, type II -Poorly controlled, hemoglobin A1c 10.9 -refuses insulin sliding scale -adjust  U500  Depression/narcolepsy -Dextroamphetamine held  Candidiasis -Was given 1 dose of Diflucan IV, continue topical Monistat  Diabetic foot ulcer, left -Being followed by a podiatrist  Mediastinal adenopathy -Noted on CTA neck, appears to be increased as compared to CT chest done in 2015 -will need outpatient follow up  Restless leg syndrome -Continue home dose of mirapex, discussed with pharmacy -continue gabapentin   DVT Prophylaxis  lovenox  Code Status: DO NOT RESUSCITATE  Family Communication: None at bedside  Disposition Plan: TEE Monday and then SNF  Consultants Neurology Cardiology  Procedures  None  Antibiotics   Anti-infectives    Start     Dose/Rate Route Frequency Ordered Stop   07/20/17 2000  cephALEXin (KEFLEX) capsule 500 mg     500 mg Oral 3 times daily 07/20/17 1646     07/20/17 0230  fluconazole (DIFLUCAN) IVPB 200 mg     200 mg 100 mL/hr over 60 Minutes Intravenous  Once 07/20/17 0159 07/20/17 1025      Subjective:   No chest pain- worried about TEE- said she was told she should not be put to sleep  Objective:   Vitals:   07/21/17 2100 07/22/17 0134 07/22/17 0500 07/22/17 1111  BP: (!) 135/53 (!) 159/55 (!) 125/52 139/74  Pulse: 80 72 78 71  Resp: 18 18 18 18   Temp: 98.5 F (36.9 C) 98.5 F (36.9 C) 98.2 F (36.8 C) 98 F (36.7 C)  TempSrc: Oral Oral Oral Oral  SpO2: 95% 97% 98% 97%  Weight:      Height:        Intake/Output Summary (Last 24 hours) at 07/22/17 1249 Last data filed at 07/21/17 1700  Gross  per 24 hour  Intake              120 ml  Output                0 ml  Net              120 ml   Filed Weights   07/19/17 2223  Weight: 105.8 kg (233 lb 4.8 oz)    Exam  General: NAD in bed  Cardiovascular: rrr  Respiratory: clear  Abdomen: +BS, soft  Extremities: redness on LE    Data Reviewed: I have personally reviewed following labs and imaging studies  CBC:  Recent Labs Lab 07/19/17 1433  07/20/17 0010 07/21/17 0450  WBC 6.6  --  7.5  NEUTROABS  --  2.8  --   HGB 13.6  --  12.0  HCT 41.3  --  37.1  MCV 87.5  --  88.3  PLT 157  --  109   Basic Metabolic Panel:  Recent Labs Lab 07/19/17 1433 07/21/17 0450  NA 132* 136  K 4.4 4.1  CL 97* 105  CO2 26 24  GLUCOSE 503* 133*  BUN 23* 23*  CREATININE 0.75 0.92  CALCIUM 9.0 8.3*   GFR: Estimated Creatinine Clearance: 61.1 mL/min (by C-G formula based on SCr of 0.92 mg/dL). Liver Function Tests:  Recent Labs Lab 07/20/17 0010  AST 14*  ALT 14  ALKPHOS 84  BILITOT 0.5  PROT 7.0  ALBUMIN 3.2*   No results for input(s): LIPASE, AMYLASE in the last 168 hours. No results for input(s): AMMONIA in the last 168 hours. Coagulation Profile:  Recent Labs Lab 07/19/17 1433  INR 1.04   Cardiac Enzymes:  Recent Labs Lab 07/20/17 0010 07/20/17 0450 07/20/17 0821  TROPONINI <0.03 <0.03 <0.03   BNP (last 3 results) No results for input(s): PROBNP in the last 8760 hours. HbA1C:  Recent Labs  07/20/17 0010  HGBA1C 10.9*   CBG:  Recent Labs Lab 07/21/17 1135 07/21/17 1633 07/21/17 2115 07/22/17 0629 07/22/17 1130  GLUCAP 219* 87 98 178* 139*   Lipid Profile:  Recent Labs  07/20/17 0450  CHOL 109  HDL 40*  LDLCALC 42  TRIG 136  CHOLHDL 2.7   Thyroid Function Tests: No results for input(s): TSH, T4TOTAL, FREET4, T3FREE, THYROIDAB in the last 72 hours. Anemia Panel: No results for input(s): VITAMINB12, FOLATE, FERRITIN, TIBC, IRON, RETICCTPCT in the last 72 hours. Urine analysis:    Component Value Date/Time   COLORURINE YELLOW 07/19/2017 1949   APPEARANCEUR CLEAR 07/19/2017 1949   LABSPEC 1.030 07/19/2017 1949   PHURINE 6.0 07/19/2017 1949   GLUCOSEU >=500 (A) 07/19/2017 1949   HGBUR NEGATIVE 07/19/2017 1949   HGBUR trace-lysed 09/12/2010 1538   BILIRUBINUR NEGATIVE 07/19/2017 1949   BILIRUBINUR N 02/09/2017 1222   KETONESUR NEGATIVE 07/19/2017 1949   PROTEINUR NEGATIVE  07/19/2017 1949   UROBILINOGEN 0.2 02/09/2017 1222   UROBILINOGEN 0.2 11/10/2014 1225   NITRITE NEGATIVE 07/19/2017 1949   LEUKOCYTESUR SMALL (A) 07/19/2017 1949   Sepsis Labs:   ) Recent Results (from the past 240 hour(s))  MRSA PCR Screening     Status: None   Collection Time: 07/19/17 10:53 PM  Result Value Ref Range Status   MRSA by PCR NEGATIVE NEGATIVE Final    Comment:        The GeneXpert MRSA Assay (FDA approved for NASAL specimens only), is one component of a comprehensive MRSA colonization surveillance program. It is not  intended to diagnose MRSA infection nor to guide or monitor treatment for MRSA infections.       Radiology Studies: Ct Angio Head W Or Wo Contrast  Result Date: 07/20/2017 CLINICAL DATA:  Embolic stroke on the left.  Left-sided neck pain. EXAM: CT ANGIOGRAPHY HEAD AND NECK TECHNIQUE: Multidetector CT imaging of the head and neck was performed using the standard protocol during bolus administration of intravenous contrast. Multiplanar CT image reconstructions and MIPs were obtained to evaluate the vascular anatomy. Carotid stenosis measurements (when applicable) are obtained utilizing NASCET criteria, using the distal internal carotid diameter as the denominator. CONTRAST:  50 cc Isovue 370 intravenous COMPARISON:  Brain MRI from earlier today. Intracranial MRA 10/10/2016 FINDINGS: CTA NECK FINDINGS Aortic arch: Atheromatous changes. No visualized acute finding. Three vessel branching. Right carotid system: Mild to moderate degree of predominately calcified plaque at the common carotid bifurcation. No stenosis or ulceration. No beading or dissection. Left carotid system: No notable atheromatous changes for age. No stenosis, beading, or ulceration. Vertebral arteries: No proximal subclavian flow limiting stenosis or irregularity. Codominant vertebral arteries that are both smooth and widely patent to the dura Skeleton: No acute or aggressive finding. Other  neck: No incidental mass or adenopathy in the neck. Nasal septal perforation. Upper chest: No acute finding. Increased size of mediastinal lymph nodes compared to 2015 chest CT, including a 17 mm short axis right peritracheal node. Review of the MIP images confirms the above findings CTA HEAD FINDINGS Anterior circulation: Overall mild calcified plaque on the carotid siphons. Atherosclerotic irregularity of bilateral medium size vessels without branch occlusion. No reversible proximal stenosis. Questionable sub mm bulge at the right MCA bifurcation. Posterior circulation: Symmetric vertebral arteries with mild atheromatous change. Vertebral and basilar arteries are smooth and widely patent. Good proximal PCA patency. Fairly symmetric branch visualization. Negative for aneurysm. Venous sinuses: Patent Anatomic variants: None significant Delayed phase: No abnormal intracranial enhancement Review of the MIP images confirms the above findings IMPRESSION: 1. Known posterior circulation infarcts with no flow limiting stenosis or visible embolic source. 2. Mild for age atherosclerosis. 3. Mild mediastinal adenopathy that is increased from a chest CT in 2015. A diagnostic chest CT could further characterize. Electronically Signed   By: Monte Fantasia M.D.   On: 07/20/2017 13:21   Ct Angio Neck W Or Wo Contrast  Result Date: 07/20/2017 CLINICAL DATA:  Embolic stroke on the left.  Left-sided neck pain. EXAM: CT ANGIOGRAPHY HEAD AND NECK TECHNIQUE: Multidetector CT imaging of the head and neck was performed using the standard protocol during bolus administration of intravenous contrast. Multiplanar CT image reconstructions and MIPs were obtained to evaluate the vascular anatomy. Carotid stenosis measurements (when applicable) are obtained utilizing NASCET criteria, using the distal internal carotid diameter as the denominator. CONTRAST:  50 cc Isovue 370 intravenous COMPARISON:  Brain MRI from earlier today. Intracranial  MRA 10/10/2016 FINDINGS: CTA NECK FINDINGS Aortic arch: Atheromatous changes. No visualized acute finding. Three vessel branching. Right carotid system: Mild to moderate degree of predominately calcified plaque at the common carotid bifurcation. No stenosis or ulceration. No beading or dissection. Left carotid system: No notable atheromatous changes for age. No stenosis, beading, or ulceration. Vertebral arteries: No proximal subclavian flow limiting stenosis or irregularity. Codominant vertebral arteries that are both smooth and widely patent to the dura Skeleton: No acute or aggressive finding. Other neck: No incidental mass or adenopathy in the neck. Nasal septal perforation. Upper chest: No acute finding. Increased size of mediastinal lymph nodes  compared to 2015 chest CT, including a 17 mm short axis right peritracheal node. Review of the MIP images confirms the above findings CTA HEAD FINDINGS Anterior circulation: Overall mild calcified plaque on the carotid siphons. Atherosclerotic irregularity of bilateral medium size vessels without branch occlusion. No reversible proximal stenosis. Questionable sub mm bulge at the right MCA bifurcation. Posterior circulation: Symmetric vertebral arteries with mild atheromatous change. Vertebral and basilar arteries are smooth and widely patent. Good proximal PCA patency. Fairly symmetric branch visualization. Negative for aneurysm. Venous sinuses: Patent Anatomic variants: None significant Delayed phase: No abnormal intracranial enhancement Review of the MIP images confirms the above findings IMPRESSION: 1. Known posterior circulation infarcts with no flow limiting stenosis or visible embolic source. 2. Mild for age atherosclerosis. 3. Mild mediastinal adenopathy that is increased from a chest CT in 2015. A diagnostic chest CT could further characterize. Electronically Signed   By: Monte Fantasia M.D.   On: 07/20/2017 13:21     Scheduled Meds: . cephALEXin  500 mg  Oral TID  . clopidogrel  75 mg Oral Daily  . enoxaparin (LOVENOX) injection  40 mg Subcutaneous Q24H  . insulin aspart  0-15 Units Subcutaneous TID WC  . insulin aspart  0-5 Units Subcutaneous QHS  . insulin regular human CONCENTRATED  15 Units Subcutaneous BID WC  . insulin regular human CONCENTRATED  70 Units Subcutaneous Q breakfast  . losartan  50 mg Oral Daily  . miconazole   Topical BID  . zolpidem  5 mg Oral QHS   Continuous Infusions:    LOS: 2 days   Time Spent in minutes   30 minutes  Burnette Valenti U Lorrine Killilea D.O. on 07/22/2017 at 12:49 PM    [facility] Office  [phone]

## 2017-07-23 NOTE — Interval H&P Note (Signed)
History and Physical Interval Note:  07/23/2017 5:01 PM  Melanie Mcdonald  has presented today for surgery, with the diagnosis of stroke  The various methods of treatment have been discussed with the patient and family. After consideration of risks, benefits and other options for treatment, the patient has consented to  Procedure(s): LOOP RECORDER INSERTION (N/A) as a surgical intervention .  The patient's history has been reviewed, patient examined, no change in status, stable for surgery.  I have reviewed the patient's chart and labs.  Questions were answered to the patient's satisfaction.     Cristopher Peru

## 2017-07-23 NOTE — Progress Notes (Signed)
PROGRESS NOTE    ADELIZ TONKINSON  HAL:[id number] DOB: [birth year] DOA: 07/19/2017 PCP: Laurey Morale, MD   Chief Complaint  Patient presents with  . Weakness    Brief Narrative:  HPI on 07/19/2017 by Dr. [name] MARIACLARA SPEAR is a 83 y.o. female with medical history significant of TIA; PVD; narcolepsy; HTN; HLD; DM; depression; CAD; COPD; and chronic pain presenting due to "Strokish".  Her son noticed that she was different since Saturday.  She gets confused and can't talk.  She knows what she wants to say but can't get the words out.  Her symptoms are intermittent, but she has been getting chronically weaker the whole time. She had a mini-stroke 4-5 times in the last 3-4 years.  She had one Saturday and it just keeps coming back.  Most times she relaxes and calms down and within 3-4 hours it goes away but this time it kept coming back.    Assessment & Plan   Acute CVA -CT head showed no acute abnormality -MRI/MRA brain: Several subcentimeter foci of acute/early subacute infarction within left cerebellar hemisphere, left thalamus, left parietal subcortical white matter. Patent circle of Willis, no high-grade stenosis, large vessel occlusion or aneurysm identified -CTA neck: Known posterior circulation infarcts with no flow-limiting stenosis or visible embolic source. -Neurology consulted and appreciated; discussed with Dr. Jaynee Eagles, recommended TEE -Hemoglobin A1c 10.9, LDL 42 -Echocardiogram: normal EF with mitral valve stenosis -PT recommended SNF -Continue Plavix -unable to do TEE under sedation--spoke with Dr. Erlinda Hong-- will not pursue under anesthesia-- will arrange for loop only  Essential Hypertension -Cozaar  Hyperlipidemia -Lipid panel: Total cholesterol 109, HDL 40, LDL 42, triglycerides 136 -Currently on no cholesterol medications  Diabetes mellitus, type II -Poorly controlled, hemoglobin A1c 10.9 -refuses insulin sliding scale -adjust  U500  Depression/narcolepsy -Dextroamphetamine held  Candidiasis -Was given 1 dose of Diflucan IV, continue topical Monistat  Diabetic foot ulcer, left -Being followed by a podiatrist  Mediastinal adenopathy -Noted on CTA neck, appears to be increased as compared to CT chest done in 2015 -will need outpatient follow up  Restless leg syndrome -Continue home dose of mirapex, discussed with pharmacy -continue gabapentin   DVT Prophylaxis  lovenox  Code Status: DO NOT RESUSCITATE  Family Communication: son  Disposition Plan: loop recorder then SNF when bed available  Consultants Neurology Cardiology  Procedures  None  Antibiotics   Anti-infectives    Start     Dose/Rate Route Frequency Ordered Stop   07/20/17 2000  cephALEXin (KEFLEX) capsule 500 mg     500 mg Oral 3 times daily 07/20/17 1646     07/20/17 0230  fluconazole (DIFLUCAN) IVPB 200 mg     200 mg 100 mL/hr over 60 Minutes Intravenous  Once 07/20/17 0159 07/20/17 7353      Subjective:   Unable to have TEE done  Objective:   Vitals:   07/23/17 0950 07/23/17 1000 07/23/17 1010 07/23/17 1115  BP: (!) 110/53 (!) 124/45 (!) 115/46 (!) 145/58  Pulse: 80 76 77 72  Resp: 20 19 18 18   Temp:    97.7 F (36.5 C)  TempSrc:    Axillary  SpO2: 92% 93% 92% 98%  Weight:      Height:        Intake/Output Summary (Last 24 hours) at 07/23/17 1245 Last data filed at 07/23/17 0259  Gross per 24 hour  Intake           203.33 ml  Output  0 ml  Net           203.33 ml   Filed Weights   07/19/17 2223  Weight: 105.8 kg (233 lb 4.8 oz)    Exam  General: sleepy after procedure  Cardiovascular: rrr  Respiratory: clear, no wheezing  Extremities: min LE edema    Data Reviewed: I have personally reviewed following labs and imaging studies  CBC:  Recent Labs Lab 07/19/17 1433 07/20/17 0010 07/21/17 0450 07/23/17 0307  WBC 6.6  --  7.5 6.3  NEUTROABS  --  2.8  --   --   HGB 13.6  --   12.0 13.0  HCT 41.3  --  37.1 40.4  MCV 87.5  --  88.3 89.2  PLT 157  --  173 916   Basic Metabolic Panel:  Recent Labs Lab 07/19/17 1433 07/21/17 0450 07/23/17 0307  NA 132* 136 137  K 4.4 4.1 4.4  CL 97* 105 106  CO2 26 24 23   GLUCOSE 503* 133* 159*  BUN 23* 23* 18  CREATININE 0.75 0.92 0.69  CALCIUM 9.0 8.3* 8.5*   GFR: Estimated Creatinine Clearance: 70.3 mL/min (by C-G formula based on SCr of 0.69 mg/dL). Liver Function Tests:  Recent Labs Lab 07/20/17 0010  AST 14*  ALT 14  ALKPHOS 84  BILITOT 0.5  PROT 7.0  ALBUMIN 3.2*   No results for input(s): LIPASE, AMYLASE in the last 168 hours. No results for input(s): AMMONIA in the last 168 hours. Coagulation Profile:  Recent Labs Lab 07/19/17 1433  INR 1.04   Cardiac Enzymes:  Recent Labs Lab 07/20/17 0010 07/20/17 0450 07/20/17 0821  TROPONINI <0.03 <0.03 <0.03   BNP (last 3 results) No results for input(s): PROBNP in the last 8760 hours. HbA1C: No results for input(s): HGBA1C in the last 72 hours. CBG:  Recent Labs Lab 07/22/17 1606 07/22/17 2149 07/23/17 0654 07/23/17 1048 07/23/17 1128  GLUCAP 86 139* 173* 205* 221*   Lipid Profile: No results for input(s): CHOL, HDL, LDLCALC, TRIG, CHOLHDL, LDLDIRECT in the last 72 hours. Thyroid Function Tests: No results for input(s): TSH, T4TOTAL, FREET4, T3FREE, THYROIDAB in the last 72 hours. Anemia Panel: No results for input(s): VITAMINB12, FOLATE, FERRITIN, TIBC, IRON, RETICCTPCT in the last 72 hours. Urine analysis:    Component Value Date/Time   COLORURINE YELLOW 07/19/2017 1949   APPEARANCEUR CLEAR 07/19/2017 1949   LABSPEC 1.030 07/19/2017 1949   PHURINE 6.0 07/19/2017 1949   GLUCOSEU >=500 (A) 07/19/2017 1949   HGBUR NEGATIVE 07/19/2017 1949   HGBUR trace-lysed 09/12/2010 1538   BILIRUBINUR NEGATIVE 07/19/2017 1949   BILIRUBINUR N 02/09/2017 1222   KETONESUR NEGATIVE 07/19/2017 1949   PROTEINUR NEGATIVE 07/19/2017 1949    UROBILINOGEN 0.2 02/09/2017 1222   UROBILINOGEN 0.2 11/10/2014 1225   NITRITE NEGATIVE 07/19/2017 1949   LEUKOCYTESUR SMALL (A) 07/19/2017 1949   Sepsis Labs:    Recent Results (from the past 240 hour(s))  MRSA PCR Screening     Status: None   Collection Time: 07/19/17 10:53 PM  Result Value Ref Range Status   MRSA by PCR NEGATIVE NEGATIVE Final    Comment:        The GeneXpert MRSA Assay (FDA approved for NASAL specimens only), is one component of a comprehensive MRSA colonization surveillance program. It is not intended to diagnose MRSA infection nor to guide or monitor treatment for MRSA infections.       Radiology Studies: No results found.   Scheduled Meds: . cephALEXin  500 mg Oral TID  . clopidogrel  75 mg Oral Daily  . enoxaparin (LOVENOX) injection  40 mg Subcutaneous Q24H  . insulin aspart  0-15 Units Subcutaneous TID WC  . insulin aspart  0-5 Units Subcutaneous QHS  . insulin regular human CONCENTRATED  15 Units Subcutaneous BID WC  . insulin regular human CONCENTRATED  70 Units Subcutaneous Q breakfast  . losartan  50 mg Oral Daily  . miconazole   Topical BID  . zolpidem  5 mg Oral QHS   Continuous Infusions: . sodium chloride 50 mL/hr at 07/23/17 0023     LOS: 3 days   Time Spent in minutes   25 minutes  Gwenneth Whiteman U Ajani Schnieders D.O. on 07/23/2017 at 12:45 PM    [facility] Office  [phone]

## 2017-07-23 NOTE — H&P (View-Only) (Signed)
ELECTROPHYSIOLOGY CONSULT NOTE  Patient ID: Melanie Mcdonald MRN: [id number], DOB/AGE: [birth year]   Admit date: 07/19/2017 Date of Consult: 07/23/2017  Primary Physician: Laurey Morale, MD Primary Cardiologist: Dr. Meda Coffee (last in 2016) Reason for Consultation: Cryptogenic stroke ; recommendations regarding Implantable Loop Recorder  History of Present Illness Melanie Mcdonald was admitted on 07/19/2017 with confusion, word finding difficulties, BS was noted 503 on admission.  PMHx noted for HTN, HLD, TIA's, obesity, narcolepsy, diastolic CHF, LVH, COPD, DM, falls.  She first developed symptoms while while at home waxing/waning over a couple days.  Imaging demonstrated per neurology,several punctate left cerebellum, left thalamus, left MCA/PCA and left MCA/ACA infarcts, embolic - source unknown.   she has undergone workup for stroke including echocardiogram and carotid angio.  The patient has been monitored on telemetry which has demonstrated sinus rhythm with no arrhythmias.  Inpatient stroke work-up is to be completed with a TEE.   Echocardiogram this admission demonstrated  TTE  07/21/2017 Study Conclusions - Left ventricle: The cavity size was normal. There was moderate concentric hypertrophy. Systolic function was normal. The estimated ejection fraction was in the range of 60% to 65%. Wall motion was normal; there were no regional wall motion abnormalities. Doppler parameters are consistent with abnormal left ventricular relaxation (grade 1 diastolic dysfunction). Doppler parameters are consistent with high ventricular filling pressure. - Aortic valve: Transvalvular velocity was within the normal range. There was no stenosis. There was no regurgitation. Valve area (Vmax): 1.68 cm^2. - Mitral valve: Severely calcified annulus. The findings are consistent with moderate stenosis. There was no regurgitation. Valve area by pressure half-time: 1.95 cm^2. Valve  area by continuity equation (using LVOT flow): 0.6 cm^2. - Left atrium: The atrium was severely dilated. - Right ventricle: The cavity size was normal. Wall thickness was normal. Systolic function was normal. - Atrial septum: No defect or patent foramen ovale was identified. - Tricuspid valve: There was trivial regurgitation. - Pulmonary arteries: Systolic pressure was within the normal range. PA peak pressure: 34 mm Hg (S).  Lab work is reviewed.  Prior to admission, the patient denies  shortness of breath, dizziness, palpitations, or syncope.   She does mention some infrequent CP that she has felt was GI/"Gas". She is recovering from their stroke with plans to SNF at discharge.  EP has been asked to evaluate for placement of an implantable loop recorder to monitor for atrial fibrillation by Dr. Erlinda Hong.     Past Medical History:  Diagnosis Date  . ANEMIA 01/30/2008  . ARTHRITIS 12/11/2007  . BACK PAIN, CHRONIC 06/23/2008  . BREAST CYST, RIGHT 12/17/2007  . Colon polyps    FRAGMENTS OF HYPERPLASTIC POLYP  . CONTACT DERMATITIS 03/10/2009  . COPD (chronic obstructive pulmonary disease) ([place])   . CORONARY ARTERY DISEASE 03/01/2007   had a normal Myoview stress test 07-13-11  . CYSTOCELE WITHOUT MENTION UTERINE PROLAPSE LAT 09/12/2010  . DEGENERATIVE JOINT DISEASE 08/07/2007  . DEPRESSION 03/01/2007  . DIABETES MELLITUS, TYPE II 08/26/2008   sees Dr. Buddy Duty   . Diverticulosis of colon (without mention of hemorrhage)   . Esophageal reflux 11/06/2007  . HYPERKERATOSIS 06/02/2009  . HYPERLIPIDEMIA 08/26/2008  . HYPERTENSION 03/01/2007  . Irritable bowel syndrome 01/26/2009  . LABYRINTHITIS 11/03/2009  . 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 NEPHROLITHIASIS 04/29/2009  . OBESITY 03/01/2007  . PERIPHERAL VASCULAR DISEASE 01/26/2009  . SYNCOPE 08/26/2008   had brain MRI on 06-28-12 showing only chronic microvascular ischemia and atrophy   .  TRANSIENT ISCHEMIC ATTACK 10/20/2009   had  normal brain MRA with patent vertebrals and carotids 06-28-12     Surgical History:  Past Surgical History:  Procedure Laterality Date  . ABDOMINAL HYSTERECTOMY    . BACK SURGERY     x2  . CARDIAC CATHETERIZATION  07/2009  . CATARACT EXTRACTION    . COLONOSCOPY  11-13-07   per Dr. Deatra Ina, benign polyps, repeat in 5 yrs  . ESOPHAGOGASTRODUODENOSCOPY  11-13-07   per Dr. Deatra Ina, normal   . INTRAOCULAR LENS INSERTION    . SPINE SURGERY     x 3  . TONSILECTOMY, ADENOIDECTOMY, BILATERAL MYRINGOTOMY AND TUBES       Prescriptions Prior to Admission  Medication Sig Dispense Refill Last Dose  . albuterol (PROVENTIL HFA;VENTOLIN HFA) 108 (90 Base) MCG/ACT inhaler Inhale 2 puffs into the lungs every 4 (four) hours as needed for wheezing or shortness of breath. 1 Inhaler 3 PRN at PRN  . benzonatate (TESSALON) 200 MG capsule Take 200 mg by mouth 2 (two) times daily as needed for cough.   PRN at PRN  . Camphor-Eucalyptus-Menthol (VICKS VAPORUB EX) Apply 1 application topically daily as needed (for congestion).   PRN at PRN  . cephALEXin (KEFLEX) 500 MG capsule Take 500 mg by mouth 3 (three) times daily. FOR 10 DAYS  2 07/19/2017 at am  . dextroamphetamine (DEXEDRINE SPANSULE) 10 MG 24 hr capsule Take 20 mg by mouth daily as needed (as needed for alertness/prior to driving for narcolepsy).   PRN at PRN  . ENSURE (ENSURE) Take 237 mLs by mouth 4 (four) times daily. Reported on 02/08/2016   07/19/2017 at am  . furosemide (LASIX) 40 MG tablet Take 1 tablet (40 mg total) by mouth 2 (two) times daily. TAKE 1 IN THE MORNING AND ONE IN THE EVENING (Patient taking differently: Take 40 mg by mouth 2 (two) times daily as needed for fluid or edema (in the legs/feet). 40 mg two times a day as needed for edema or fluid) 60 tablet 11 Past Week at Unknown time  . gabapentin (NEURONTIN) 300 MG capsule Take 1 capsule (300 mg total) by mouth 3 (three) times daily. (Patient taking differently: Take 300 mg by mouth daily as  needed (for nerve pain in the feet). ) 90 capsule 5 07/18/2017 at pm  . Glucos-Chond-Hyal Ac-Ca Fructo (MOVE FREE JOINT HEALTH ADVANCE) TABS Take 1 tablet by mouth 2 (two) times daily.   Past Week at Unknown time  . insulin regular human CONCENTRATED (HUMULIN R U-500 KWIKPEN) 500 UNIT/ML kwikpen Take 60units at breakfast 50 at lunch and 50 at supper (Patient taking differently: Inject 30-70 Units into the skin See admin instructions. 70 units before breakfast then 30 units before lunch then 30 units at bedtime) 1 pen 12 07/18/2017 at pm  . ketoconazole (NIZORAL) 2 % cream Apply 1 application topically 2 (two) times daily as needed for irritation (between bikini lines).   PRN at PRN  . nitroGLYCERIN (NITROSTAT) 0.4 MG SL tablet Place 1 tablet (0.4 mg total) under the tongue every 5 (five) minutes as needed for chest pain. 50 tablet 5 Past Month at Unknown time  . ondansetron (ZOFRAN) 8 MG tablet Take 1 tablet (8 mg total) by mouth every 8 (eight) hours as needed for nausea or vomiting. 60 tablet 5 PRN at PRN  . OVER THE COUNTER MEDICATION [employer] caplets for Leg Cramps (Cinchona Off. 3X HPUS; Viscum Alb. 3X HPUS; Gnaphalium Poly. 3X HPUS; Rhus Tox. 6X HPUS;  Aconitum Nap. 6X HPUS; Ledum Pal. 6X HPUS; Magnesia Phos. 6X HPUS. Inactive Ingredients: Lactose NF, Microcrystalline Cellulose, and Vegetable Magnesium Stearate): Take 2 caplets by mouth up to four times a day as needed for leg cramps   Past Week at Unknown time  . oxyCODONE-acetaminophen (PERCOCET) 10-325 MG tablet Take 1 tablet by mouth every 6 (six) hours as needed for pain. 120 tablet 0 07/18/2017 at pm  . phenazopyridine (AZO-TABS) 95 MG tablet Take 95 mg by mouth 3 (three) times daily as needed for pain.   PRN at PRN  . pramipexole (MIRAPEX) 1 MG tablet TAKE 5mg =5 TABLETS BY MOUTH daily at bedtime (Patient taking differently: Take 4-5 mg by mouth at bedtime as needed (for restless legs). ) 540 tablet 1 07/18/2017 at pm  . zolpidem (AMBIEN) 10 MG  tablet TAKE 1 TABLET BY MOUTH AT BEDTIME (Patient taking differently: Take 10 mg by mouth at bedtime) 30 tablet 5 07/18/2017 at pm  . glucose blood (ACCU-CHEK AVIVA) test strip Test once per day and diagnosis code is E 11.9 100 each 1 Unk at Unk  . hydrocortisone (CORTEF) 5 MG tablet Take 5 mg by mouth daily. WITH FOOD OR MILK  5 Not yet at Not yet  . losartan (COZAAR) 50 MG tablet Take 1 tablet (50 mg total) by mouth daily. 90 tablet 3 Not yet at Not yet    Inpatient Medications:  . cephALEXin  500 mg Oral TID  . clopidogrel  75 mg Oral Daily  . enoxaparin (LOVENOX) injection  40 mg Subcutaneous Q24H  . insulin aspart  0-15 Units Subcutaneous TID WC  . insulin aspart  0-5 Units Subcutaneous QHS  . insulin regular human CONCENTRATED  15 Units Subcutaneous BID WC  . insulin regular human CONCENTRATED  70 Units Subcutaneous Q breakfast  . losartan  50 mg Oral Daily  . miconazole   Topical BID  . zolpidem  5 mg Oral QHS    Allergies:  Allergies  Allergen Reactions  . Aspirin Other (See Comments)    Abdominal pain  . Ciprofloxacin Nausea And Vomiting    Made pt very sick on stomach  . Clarithromycin     Made stomach "burn"  . Doxycycline Itching  . Erythromycin Nausea And Vomiting  . Lisinopril Cough  . Lyrica [Pregabalin] Other (See Comments)    Suicidal   . Macrobid [Nitrofurantoin Monohyd Macro] Nausea And Vomiting  . Metolazone Other (See Comments)    Pt stated this made her B/P drop  . Other Nausea And Vomiting    Pt cannot have any of the -mycin(s)  . Penicillins Swelling    From childhood: swelling @ injection site Has patient had a PCN reaction causing immediate rash, facial/tongue/throat swelling, SOB or lightheadedness with hypotension: Yes Has patient had a PCN reaction causing severe rash involving mucus membranes or skin necrosis: No Has patient had a PCN reaction that required hospitalization: No Has patient had a PCN reaction occurring within the last 10 years:  No If all of the above answers are "NO", then may proceed with Cephalosporin use.   . Sulfonamide Derivatives Nausea And Vomiting  . Valtrex [Valacyclovir Hcl] Nausea Only    Social History   Social History  . Marital status: Widowed    Spouse name: N/A  . Number of children: N/A  . Years of education: N/A   Occupational History  . Not on file.   Social History Main Topics  . Smoking status: Never Smoker  . Smokeless  tobacco: Never Used  . Alcohol use No  . Drug use: No  . Sexual activity: No   Other Topics Concern  . Not on file   Social History Narrative  . No narrative on file     Family History  Problem Relation Age of Onset  . Lupus Mother   . COPD Father       Review of Systems: All other systems reviewed and are otherwise negative except as noted above.  Physical Exam: Vitals:   07/22/17 2151 07/23/17 0151 07/23/17 0617 07/23/17 0655  BP: (!) 146/61 (!) 136/53 (!) 171/67 (!) 150/60  Pulse: 71 71 82 80  Resp: 18 18 18    Temp: 97.9 F (36.6 C) 97.9 F (36.6 C) 98.1 F (36.7 C)   TempSrc: Oral Oral Oral   SpO2: 97% 96% 98%   Weight:      Height:        GEN- The patient is well appearing, alert and oriented x 3 today.   Head- normocephalic, atraumatic Eyes-  Sclera clear, conjunctiva pink Ears- hearing intact Oropharynx- clear Neck- supple Lungs- CTA b/l, normal work of breathing Heart- RRR, no murmurs, rubs or gallops  GI- soft, NT, ND Extremities- no clubbing, cyanosis, or edema MS- no significant deformity or atrophy Skin- no rash or lesion Psych- euthymic mood, full affect   Labs:   Lab Results  Component Value Date   WBC 6.3 07/23/2017   HGB 13.0 07/23/2017   HCT 40.4 07/23/2017   MCV 89.2 07/23/2017   PLT 164 07/23/2017    Recent Labs Lab 07/20/17 0010  07/23/17 0307  NA  --   < > 137  K  --   < > 4.4  CL  --   < > 106  CO2  --   < > 23  BUN  --   < > 18  CREATININE  --   < > 0.69  CALCIUM  --   < > 8.5*  PROT 7.0   --   --   BILITOT 0.5  --   --   ALKPHOS 84  --   --   ALT 14  --   --   AST 14*  --   --   GLUCOSE  --   < > 159*  < > = values in this interval not displayed. Lab Results  Component Value Date   CKTOTAL 67 10/10/2016   CKMB 1.6 07/23/2009   TROPONINI <0.03 07/20/2017   Lab Results  Component Value Date   CHOL 109 07/20/2017   CHOL 128 02/09/2017   CHOL 125 10/10/2016   Lab Results  Component Value Date   HDL 40 (L) 07/20/2017   HDL 52.20 02/09/2017   HDL 48 10/10/2016   Lab Results  Component Value Date   LDLCALC 42 07/20/2017   LDLCALC 63 02/09/2017   LDLCALC 61 10/10/2016   Lab Results  Component Value Date   TRIG 136 07/20/2017   TRIG 61.0 02/09/2017   TRIG 80 10/10/2016   Lab Results  Component Value Date   CHOLHDL 2.7 07/20/2017   CHOLHDL 2 02/09/2017   CHOLHDL 2.6 10/10/2016   No results found for: LDLDIRECT  Lab Results  Component Value Date   DDIMER 0.50 (H) 02/13/2014     Radiology/Studies:   Ct Angio Head W Or Wo Contrast Result Date: 07/20/2017 CLINICAL DATA:  Embolic stroke on the left.  Left-sided neck pain. EXAM: CT ANGIOGRAPHY HEAD AND NECK TECHNIQUE: Multidetector CT  imaging of the head and neck was performed using the standard protocol during bolus administration of intravenous contrast. Multiplanar CT image reconstructions and MIPs were obtained to evaluate the vascular anatomy. Carotid stenosis measurements (when applicable) are obtained utilizing NASCET criteria, using the distal internal carotid diameter as the denominator. CONTRAST:  50 cc Isovue 370 intravenous COMPARISON:  Brain MRI from earlier today. Intracranial MRA 10/10/2016 FINDINGS: CTA NECK FINDINGS Aortic arch: Atheromatous changes. No visualized acute finding. Three vessel branching. Right carotid system: Mild to moderate degree of predominately calcified plaque at the common carotid bifurcation. No stenosis or ulceration. No beading or dissection. Left carotid system: No  notable atheromatous changes for age. No stenosis, beading, or ulceration. Vertebral arteries: No proximal subclavian flow limiting stenosis or irregularity. Codominant vertebral arteries that are both smooth and widely patent to the dura Skeleton: No acute or aggressive finding. Other neck: No incidental mass or adenopathy in the neck. Nasal septal perforation. Upper chest: No acute finding. Increased size of mediastinal lymph nodes compared to 2015 chest CT, including a 17 mm short axis right peritracheal node. Review of the MIP images confirms the above findings CTA HEAD FINDINGS Anterior circulation: Overall mild calcified plaque on the carotid siphons. Atherosclerotic irregularity of bilateral medium size vessels without branch occlusion. No reversible proximal stenosis. Questionable sub mm bulge at the right MCA bifurcation. Posterior circulation: Symmetric vertebral arteries with mild atheromatous change. Vertebral and basilar arteries are smooth and widely patent. Good proximal PCA patency. Fairly symmetric branch visualization. Negative for aneurysm. Venous sinuses: Patent Anatomic variants: None significant Delayed phase: No abnormal intracranial enhancement Review of the MIP images confirms the above findings IMPRESSION: 1. Known posterior circulation infarcts with no flow limiting stenosis or visible embolic source. 2. Mild for age atherosclerosis. 3. Mild mediastinal adenopathy that is increased from a chest CT in 2015. A diagnostic chest CT could further characterize. Electronically Signed   By: Monte Fantasia M.D.   On: 07/20/2017 13:21    Dg Chest 2 View Result Date: 07/19/2017 CLINICAL DATA:  TIA EXAM: CHEST  2 VIEW COMPARISON:  10/09/2016 FINDINGS: Subsegmental atelectasis at the left base. Borderline to mild cardiomegaly with minimal central congestion. No consolidation or effusion. Aortic atherosclerosis. No pneumothorax. Degenerative changes of the spine. IMPRESSION: 1. Borderline to mild  cardiomegaly with mild central congestion. 2. Subsegmental atelectasis at the left base Electronically Signed   By: Donavan Foil M.D.   On: 07/19/2017 23:59    Ct Head Wo Contrast Result Date: 07/19/2017 CLINICAL DATA:  83 year old female with progressive weakness for 1 week. EXAM: CT HEAD WITHOUT CONTRAST TECHNIQUE: Contiguous axial images were obtained from the base of the skull through the vertex without intravenous contrast. COMPARISON:  Head CT without contrast 03/30/2017. Brain MRI 10/10/2016, and earlier. FINDINGS: Brain: Cerebral volume remains normal for age. Stable mild for age cerebral white matter hypodensity. No midline shift, ventriculomegaly, mass effect, evidence of mass lesion, intracranial hemorrhage or evidence of cortically based acute infarction. Vascular: Calcified atherosclerosis at the skull base. No suspicious intracranial vascular hyperdensity. Skull: No acute osseous abnormality identified. Sinuses/Orbits: Visualized paranasal sinuses and mastoids are stable and well pneumatized. Other: No acute orbit or scalp soft tissue findings. IMPRESSION: 1.  No acute intracranial abnormality. 2. Stable non contrast CT appearance of the brain since June, with mild for age chronic white matter changes. Electronically Signed   By: Genevie Ann M.D.   On: 07/19/2017 16:21    Mr Brain Wo Contrast Result Date: 07/20/2017 CLINICAL  DATA:  82 y/o F; weakness, slurred speech, word-finding difficulty. Mild headache. EXAM: MRI HEAD WITHOUT CONTRAST MRA HEAD WITHOUT CONTRAST TECHNIQUE: Multiplanar, multiecho pulse sequences of the brain and surrounding structures were obtained without intravenous contrast. Angiographic images of the head were obtained using MRA technique without contrast. COMPARISON:  07/19/2017 CT of the head.  10/10/2016 MRI of the head. FINDINGS: MRI HEAD FINDINGS Brain: Subcentimeter foci of reduced diffusion compatible with acute/early subacute infarction are present within the left  inferomedial and superolateral cerebellar hemisphere, left ventrolateral thalamus, and scattered foci are present left parietal subcortical white matter. (Series 3: Image 12, 16, 25, 31, 35). Foci of acute/early subacute infarction demonstrate associated T2 FLAIR hyperintense signal abnormality. No significant mass effect. No abnormal susceptibility hypointensity to indicate intracranial hemorrhage. Stable background of scattered nonspecific foci of T2 FLAIR hyperintensity in subcortical and periventricular white matter is compatible with mild chronic microvascular ischemic changes for age and there is mild brain parenchymal volume loss. No hydrocephalus, extra-axial collection, effacement of basilar cisterns, or herniation. Vascular: As below. Skull and upper cervical spine: Normal marrow signal. Sinuses/Orbits: Mild anterior ethmoid sinus mucosal thickening. Otherwise negative. Other: Bilateral intra-ocular lens replacement. MRA HEAD FINDINGS Internal carotid arteries:  Patent. Anterior cerebral arteries:  Patent. Middle cerebral arteries: Patent. Anterior communicating artery: Patent. Posterior communicating arteries: Patent left. No right identified, likely hypoplastic or absent. Posterior cerebral arteries:  Patent. Basilar artery:  Patent. Vertebral arteries:  Patent. No evidence of high-grade stenosis, large vessel occlusion, or aneurysm identified. IMPRESSION: 1. Several subcentimeter foci of acute/early subacute infarction are present within left cerebellar hemisphere, left thalamus, and left parietal subcortical white matter. No associated mass effect or hemorrhage. Multiple vascular distributions favors embolic etiology. 2. Stable background of mild chronic microvascular ischemic changes and parenchymal volume loss of the brain. 3. Patent circle of Willis. No high-grade stenosis, large vessel occlusion, or aneurysm identified. These results will be called to the ordering clinician or representative by the  Radiologist Assistant, and communication documented in the PACS or zVision Dashboard. Electronically Signed   By: Kristine Garbe M.D.   On: 07/20/2017 00:08    12-lead ECG SR All prior EKG's in EPIC reviewed with no documented atrial fibrillation  Telemetry SR  Assessment and Plan:  1. Cryptogenic stroke The patient presents with cryptogenic stroke.  The patient went for TEE this AM, though they were unable to sedate her adequately and is rescheduled for anesthesia possibly tomorrow AM.  I spoke at length with the patient about monitoring for afib with either a 30 day event monitor or an implantable loop recorder.  Risks, benefits, and alteratives to implantable loop recorder were discussed with the patient today.   At this time, the patient is somewhat hesitant and undecided about any of the monitoring, she is AAO x3, though remains sleepy    ADDEND: Came back and spoke with the patient (and her son at bedside now).  The patient did recall our conversation from earlier today.  We revisited rational of cryptogenic stroke and heart rhythm monitoring with either 30 days EM or implantable loop monitor.  The implant procedure and its risks/benefits were re discussed as well.  The patient is clear, she would like to pursue loop implant.  She is able to restate the points we discussed accurately and states understanding.  We will plan for this afternoon, it appears plans for discharged to SNF today.  Wound care and follow up were discussed with the patient.   Renee Dyane Dustman,  PA-C 07/23/2017   EP attending  Patient seen and examined. Agree with the findings as documented above. The patient has had a cryptogenic stroke, with no obvious explanation. I discussed the treatment options including insertion of an implantable loop recorder and she wishes to proceed.  Cristopher Peru, M.D.

## 2017-07-23 NOTE — Progress Notes (Addendum)
Physical Therapy Treatment Patient Details Name: Melanie Mcdonald MRN: [id number] DOB: 12/27/1932 Today's Date: 07/23/2017    History of Present Illness Pt is a 83 y.o. female presented to the ED with confusion, speech difficulties, and weakness. PMH inclusive of TIA, PVD, narcolepsy, HTN, HLD, DM, depression, CAD, COPD, and chronic pain. MRI shows several subcentimeter foci of acute/early subacute infarcts present within left cerebellar hemisphere, left thalamus, and left parietal subcortical white matter.    PT Comments    Patient is making progress toward mobility goals. Treatment focus on increasing mobility activity and improving gait mechanics. Able to tolerate slight increase in distance but still needing increased physical assistance and cueing throughout. Current recommendations remain appropriate. PT will continue to follow acutely and progress as tolerated.   Follow Up Recommendations  SNF;Supervision/Assistance - 24 hour     Equipment Recommendations  None recommended by PT    Recommendations for Other Services       Precautions / Restrictions Precautions Precautions: Fall Restrictions Weight Bearing Restrictions: No    Mobility  Bed Mobility Overal bed mobility: Needs Assistance Bed Mobility: Supine to Sit     Supine to sit: Supervision     General bed mobility comments: with HOB elevated ~35 deg, pt able to move EOB. No physical assist required. Inc time and effort to perform. Supervision for safety  Transfers Overall transfer level: Needs assistance Equipment used: Rolling walker (2 wheeled) Transfers: Sit to/from Stand Sit to Stand: Min guard;Min assist         General transfer comment: x3 with one instance needing min assist to power up from commode. min guard for safety x2  Ambulation/Gait Ambulation/Gait assistance: Min guard;Min assist Ambulation Distance (Feet): 40 Feet Assistive device: Rolling walker (2 wheeled) Gait Pattern/deviations:  Step-through pattern;Decreased step length - right;Decreased step length - left;Decreased stride length;Decreased dorsiflexion - right;Decreased dorsiflexion - left;Narrow base of support;Trunk flexed Gait velocity: decreased Gait velocity interpretation: <1.8 ft/sec, indicative of risk for recurrent falls General Gait Details: still ambulating slow and cautious with instability, min guard for safety. Ocassional min assist for stability. Cues for pacing and posture    Stairs            Wheelchair Mobility    Modified Rankin (Stroke Patients Only) Modified Rankin (Stroke Patients Only) Pre-Morbid Rankin Score: No significant disability Modified Rankin: Moderate disability     Balance Overall balance assessment: Needs assistance;History of Falls Sitting-balance support: Feet supported;No upper extremity supported Sitting balance-Leahy Scale: Fair     Standing balance support: During functional activity;Bilateral upper extremity supported Standing balance-Leahy Scale: Poor Standing balance comment: relies on RW for support                            Cognition Arousal/Alertness: Awake/alert Behavior During Therapy: WFL for tasks assessed/performed Overall Cognitive Status: No family/caregiver present to determine baseline cognitive functioning Area of Impairment: Attention;Following commands;Awareness;Problem solving                   Current Attention Level: Selective   Following Commands: Follows multi-step commands consistently   Awareness: Emergent Problem Solving: Slow processing;Requires verbal cues        Exercises      General Comments        Pertinent Vitals/Pain Pain Assessment: No/denies pain    Home Living  Prior Function            PT Goals (current goals can now be found in the care plan section) Acute Rehab PT Goals Patient Stated Goal: to go home PT Goal Formulation: With patient Time For  Goal Achievement: 08/03/17 Potential to Achieve Goals: Good Progress towards PT goals: Progressing toward goals    Frequency    Min 3X/week      PT Plan Current plan remains appropriate    Co-evaluation              AM-PAC PT "6 Clicks" Daily Activity  Outcome Measure  Difficulty turning over in bed (including adjusting bedclothes, sheets and blankets)?: A Little Difficulty moving from lying on back to sitting on the side of the bed? : A Little Difficulty sitting down on and standing up from a chair with arms (e.g., wheelchair, bedside commode, etc,.)?: A Lot Help needed moving to and from a bed to chair (including a wheelchair)?: A Lot Help needed walking in hospital room?: A Lot Help needed climbing 3-5 steps with a railing? : Total 6 Click Score: 13    End of Session Equipment Utilized During Treatment: Gait belt Activity Tolerance: Patient limited by fatigue Patient left: in chair;with call bell/phone within reach Nurse Communication: Mobility status PT Visit Diagnosis: Unsteadiness on feet (R26.81);Muscle weakness (generalized) (M62.81);Difficulty in walking, not elsewhere classified (R26.2)     Time: 2725-3664 PT Time Calculation (min) (ACUTE ONLY): 22 min  Charges:  $Gait Training: 8-22 mins                    G CodesSindy Guadeloupe, SPT [phone] office    Margarita Grizzle 07/23/2017, 4:20 PM

## 2017-07-23 NOTE — Progress Notes (Signed)
PT Cancellation Note  Patient Details Name: Melanie Mcdonald MRN: [id number] DOB: 1933-09-16   Cancelled Treatment:    Reason Eval/Treat Not Completed: Patient at procedure or test/unavailable   Duncan Dull 07/23/2017, 9:53 AM

## 2017-07-23 NOTE — Interval H&P Note (Signed)
History and Physical Interval Note:  07/23/2017 8:34 AM  Melanie Mcdonald  has presented today for surgery, with the diagnosis of STROKE  The various methods of treatment have been discussed with the patient and family. After consideration of risks, benefits and other options for treatment, the patient has consented to  Procedure(s): TRANSESOPHAGEAL ECHOCARDIOGRAM (TEE) (N/A) as a surgical intervention .  The patient's history has been reviewed, patient examined, no change in status, stable for surgery.  I have reviewed the patient's chart and labs.  Questions were answered to the patient's satisfaction.     Ena Dawley

## 2017-07-23 NOTE — Progress Notes (Signed)
STROKE TEAM PROGRESS NOTE   SUBJECTIVE (INTERVAL HISTORY) No family is at bedside.  No acute event overnight.  TEE cancelled at pt can not get adequate sedation for the procedure. Discussed with Dr. Eliseo Squires to see if we can get loop recorder placed without TEE.    OBJECTIVE Temp:  [97.7 F (36.5 C)-98.1 F (36.7 C)] 97.7 F (36.5 C) (10/22 1115) Pulse Rate:  [71-89] 72 (10/22 1115) Cardiac Rhythm: Normal sinus rhythm (10/22 0100) Resp:  [13-21] 18 (10/22 1115) BP: (110-207)/(38-120) 145/58 (10/22 1115) SpO2:  [92 %-100 %] 98 % (10/22 1115)  CBC:   Recent Labs Lab 07/20/17 0010 07/21/17 0450 07/23/17 0307  WBC  --  7.5 6.3  NEUTROABS 2.8  --   --   HGB  --  12.0 13.0  HCT  --  37.1 40.4  MCV  --  88.3 89.2  PLT  --  173 315    Basic Metabolic Panel:   Recent Labs Lab 07/21/17 0450 07/23/17 0307  NA 136 137  K 4.1 4.4  CL 105 106  CO2 24 23  GLUCOSE 133* 159*  BUN 23* 18  CREATININE 0.92 0.69  CALCIUM 8.3* 8.5*    Lipid Panel:     Component Value Date/Time   CHOL 109 07/20/2017 0450   CHOL 160 12/21/2014 1153   TRIG 136 07/20/2017 0450   TRIG 62 12/21/2014 1153   HDL 40 (L) 07/20/2017 0450   HDL 74 12/21/2014 1153   CHOLHDL 2.7 07/20/2017 0450   VLDL 27 07/20/2017 0450   LDLCALC 42 07/20/2017 0450   LDLCALC 74 12/21/2014 1153   HgbA1c:  Lab Results  Component Value Date   HGBA1C 10.9 (H) 07/20/2017   Urine Drug Screen:     Component Value Date/Time   LABOPIA NONE DETECTED 07/19/2017 1949   COCAINSCRNUR NONE DETECTED 07/19/2017 1949   LABBENZ NONE DETECTED 07/19/2017 1949   AMPHETMU NONE DETECTED 07/19/2017 1949   THCU NONE DETECTED 07/19/2017 1949   LABBARB NONE DETECTED 07/19/2017 1949    Alcohol Level     Component Value Date/Time   ETH <10 07/19/2017 1433    IMAGING I have personally reviewed the radiological images below and agree with the radiology interpretations.  Dg Chest 2 View 07/19/2017 1. Borderline to mild cardiomegaly  with mild central congestion.  2. Subsegmental atelectasis at the left base   Ct Head Wo Contrast 07/19/2017 IMPRESSION:  1.  No acute intracranial abnormality.  2. Stable non contrast CT appearance of the brain since June, with mild for age chronic white matter changes.   Mr Melanie Mcdonald Head Wo Contrast 07/20/2017 IMPRESSION:  1. Several subcentimeter foci of acute/early subacute infarction are present within left cerebellar hemisphere, left thalamus, and left parietal subcortical white matter. No associated mass effect or hemorrhage. Multiple vascular distributions favors embolic etiology.  2. Stable background of mild chronic microvascular ischemic changes and parenchymal volume loss of the brain.  3. Patent circle of Willis. No high-grade stenosis, large vessel occlusion, or aneurysm identified.   CT Angio Head and Neck 07/20/2017 IMPRESSION: 1. Known posterior circulation infarcts with no flow limiting stenosis or visible embolic source. 2. Mild for age atherosclerosis. 3. Mild mediastinal adenopathy that is increased from a chest CT in 2015. A diagnostic chest CT could further characterize.  LE venous Doppler - no DVT  TTE  07/21/2017 Study Conclusions - Left ventricle: The cavity size was normal. There was moderate   concentric hypertrophy. Systolic function was normal. The  estimated ejection fraction was in the range of 60% to 65%. Wall   motion was normal; there were no regional wall motion   abnormalities. Doppler parameters are consistent with abnormal   left ventricular relaxation (grade 1 diastolic dysfunction).   Doppler parameters are consistent with high ventricular filling   pressure. - Aortic valve: Transvalvular velocity was within the normal range.   There was no stenosis. There was no regurgitation. Valve area   (Vmax): 1.68 cm^2. - Mitral valve: Severely calcified annulus. The findings are   consistent with moderate stenosis. There was no regurgitation.    Valve area by pressure half-time: 1.95 cm^2. Valve area by   continuity equation (using LVOT flow): 0.6 cm^2. - Left atrium: The atrium was severely dilated. - Right ventricle: The cavity size was normal. Wall thickness was   normal. Systolic function was normal. - Atrial septum: No defect or patent foramen ovale was identified. - Tricuspid valve: There was trivial regurgitation. - Pulmonary arteries: Systolic pressure was within the normal   range. PA peak pressure: 34 mm Hg (S).  EEG normal    PHYSICAL EXAM Vitals:   07/23/17 0950 07/23/17 1000 07/23/17 1010 07/23/17 1115  BP: (!) 110/53 (!) 124/45 (!) 115/46 (!) 145/58  Pulse: 80 76 77 72  Resp: 20 19 18 18   Temp:    97.7 F (36.5 C)  TempSrc:    Axillary  SpO2: 92% 93% 92% 98%  Weight:      Height:        PHYSICAL EXAM Gen: Obesity, NAD Eyes: anicteric sclerae, moist conjunctivae                    CV: no MRG, no carotid bruits, no peripheral edema Neuro: Mental Status: Alert, follows commands, good historian No aphasia, no dysarthria  Cranial Nerves:   The pupils are equal, round, and reactive to light.. Attempted, Fundi not visualized.  EOMI. No gaze preference. Visual fields full. Face symmetric, Tongue midline. Hearing intact to voice. Shoulder shrug intact  Motor Observation:    no involuntary movements noted. Tone appears normal.     Strength:    5/5     Sensation:  Intact to LT  Plantars downgoing.    ASSESSMENT/PLAN Melanie Mcdonald is a 83 y.o. female with history of TIAs, PVD, narcolepsy, HTN, HLD, DM, depression, CAD, COPD and chronic pain presenting with word finding problems, weakness and confusion. She did not receive IV t-PA due to late presentation.  Strokes:  several punctate left cerebellum, left thalamus, left MCA/PCA and left MCA/ACA infarcts, embolic - source unknown.   Resultant deficit resolved  CT head - No acute intracranial abnormality.   MRI head - Several subcentimeter foci  of acute/early subacute infarction are present within left cerebellar hemisphere, left thalamus, and left parietal subcortical white matter  MRA head - unremarkable.   CTA H&N - no flow limiting stenosis or visible embolic source.  Bilateral LE dopplers - no DVT.   2D Echo - EF 60-65%. The left atrium was severely dilated.  TEE cancelled as pt not tolerating without anesthesia  Recommend loop recorder to be placed to rule out afib   EEG normal  LDL - 42  HgbA1c - 10.9  VTE prophylaxis - Lovenox Diet Carb Modified Fluid consistency: Thin; Room service appropriate? Yes  No antithrombotic prior to admission, now on clopidogrel 75 mg daily - Reported aspirin allergy, continue Plavix on discharge  Patient counseled to be compliant with  her antithrombotic medications  Ongoing aggressive stroke risk factor management  Therapy recommendations: SNF  Disposition: Pending  History of TIA  10/2016 -slurred speech -MRI no acute infarct, MRA, carotid Doppler, 2D echo, LE venous Doppler all negative -LDL 61 and A1c 9.6 -put on aspirin 81  Hypertension  Stable  Permissive hypertension (OK if < 220/120) but gradually normalize in 5-7 days  Long-term BP goal normotensive  Diabetes  HgbA1c 10.9, goal < 7.0  Uncontrolled  On insulin   SSI  CBG monitoring  Close PCP follow-up for better DM control  Other Stroke Risk Factors  Advanced age  Obesity, Body mass index is 35.47 kg/m., recommend weight loss, diet and exercise as appropriate   Coronary artery disease  Other Active Problems  Mild hyponatremia - 132  Hypoglycemia - poor PO intake - diabetes coordinator on board.   Reported aspirin allergy  RLS on Mirapex  Follows with Dr. Delice Lesch at [facility] day # 3  Neurology will sign off. Please call with questions. Pt will follow up with Dr. Delice Lesch at [facility] in about 6 weeks. Thanks for the consult.   Melanie Hawking, MD PhD Stroke  Neurology 07/23/2017 1:04 PM   To contact Stroke Continuity provider, please refer to [email or web address] After hours, contact General Neurology

## 2017-07-24 ENCOUNTER — Encounter (HOSPITAL_COMMUNITY): Payer: Self-pay | Admitting: Internal Medicine

## 2017-07-24 LAB — GLUCOSE, CAPILLARY
GLUCOSE-CAPILLARY: 144 mg/dL — AB (ref 65–99)
GLUCOSE-CAPILLARY: 217 mg/dL — AB (ref 65–99)

## 2017-07-24 MED ORDER — GABAPENTIN 300 MG PO CAPS: 300.0000 mg | ORAL_CAPSULE | Freq: Every day | ORAL | Status: DC | PRN

## 2017-07-24 MED ORDER — INSULIN ASPART 100 UNIT/ML ~~LOC~~ SOLN: 0.0000 [IU] | Freq: Every day | SUBCUTANEOUS | 11 refills | Status: DC

## 2017-07-24 MED ORDER — CLOPIDOGREL BISULFATE 75 MG PO TABS: 75.0000 mg | ORAL_TABLET | Freq: Every day | ORAL | Status: DC

## 2017-07-24 MED ORDER — OXYCODONE-ACETAMINOPHEN 10-325 MG PO TABS: 1.0000 | ORAL_TABLET | Freq: Four times a day (QID) | ORAL | 0 refills | Status: DC | PRN

## 2017-07-24 MED ORDER — INSULIN ASPART 100 UNIT/ML ~~LOC~~ SOLN: 0.0000 [IU] | Freq: Three times a day (TID) | SUBCUTANEOUS | 11 refills | Status: DC

## 2017-07-24 MED ORDER — INSULIN REGULAR HUMAN (CONC) 500 UNIT/ML ~~LOC~~ SOPN: 15.0000 [IU] | PEN_INJECTOR | Freq: Two times a day (BID) | SUBCUTANEOUS | Status: DC

## 2017-07-24 MED ORDER — INSULIN REGULAR HUMAN (CONC) 500 UNIT/ML ~~LOC~~ SOPN: 70.0000 [IU] | PEN_INJECTOR | Freq: Every day | SUBCUTANEOUS | Status: DC

## 2017-07-24 MED ORDER — FUROSEMIDE 40 MG PO TABS: 40.0000 mg | ORAL_TABLET | Freq: Two times a day (BID) | ORAL | Status: DC | PRN

## 2017-07-24 NOTE — Clinical Social Work Placement (Signed)
Nurse to call report to [phone], Room 120B   CLINICAL SOCIAL WORK PLACEMENT  NOTE  Date:  07/24/2017  Patient Details  Name: Melanie Mcdonald MRN: [id number] Date of Birth: 15-Jul-1933  Clinical Social Work is seeking post-discharge placement for this patient at the [place] level of care (*CSW will initial, date and re-position this form in  chart as items are completed):  Yes   Patient/family provided with [facility] Work Department's list of facilities offering this level of care within the geographic area requested by the patient (or if unable, by the patient's family).  Yes   Patient/family informed of their freedom to choose among providers that offer the needed level of care, that participate in Medicare, Medicaid or managed care program needed by the patient, have an available bed and are willing to accept the patient.  Yes   Patient/family informed of [facility]'s ownership interest in [facility] and [facility], as well as of the fact that they are under no obligation to receive care at these facilities.  PASRR submitted to EDS on 07/24/17     PASRR number received on 07/24/17     Existing PASRR number confirmed on       FL2 transmitted to all facilities in geographic area requested by pt/family on       FL2 transmitted to all facilities within larger geographic area on 07/23/17     Patient informed that his/her managed care company has contracts with or will negotiate with certain facilities, including the following:        Yes   Patient/family informed of bed offers received.  Patient chooses bed at [facility]     Physician recommends and patient chooses bed at      Patient to be transferred to [facility] on 07/24/17.  Patient to be transferred to facility by Family car     Patient family notified on 07/24/17 of transfer.  Name of family member notified:  [name]     PHYSICIAN       Additional  Comment:    _______________________________________________ Geralynn Ochs, LCSW 07/24/2017, 3:33 PM

## 2017-07-24 NOTE — NC FL2 (Signed)
[place] LEVEL OF CARE SCREENING TOOL     IDENTIFICATION  Patient Name: Melanie Mcdonald Birthdate: [birth year] Sex: female Admission Date (Current Location): 07/19/2017  [facility] and [place] Number:  Herbalist and Address:  [facility] [place], [place] [address] [place], [place] [address]      Provider Number: [id number]  Attending Physician Name and Address:  Geradine Girt, DO  Relative Name and Phone Number:       Current Level of Care: Hospital Recommended Level of Care: [place] Prior Approval Number:    Date Approved/Denied:   PASRR Number: Pending; manual review  Discharge Plan: SNF    Current Diagnoses: Patient Active Problem List   Diagnosis Date Noted  . Hyperglycemia   . History of TIA (transient ischemic attack)   . CVA (cerebral vascular accident) ([place]) 07/20/2017  . Candidiasis of genitalia in female 07/20/2017  . Chest pain 07/20/2017  . Weakness 07/19/2017  . Uncontrolled type 2 diabetes mellitus with hyperglycemia, with long-term current use of insulin ([place]) 10/09/2016  . Dysarthria 10/09/2016  . Cellulitis of right foot 06/13/2016  . Gout 06/07/2016  . Leg swelling 04/05/2016  . Pain in joint, ankle and foot 12/24/2015  . Diabetic polyneuropathy associated with diabetes mellitus due to underlying condition ([place]) 06/14/2015  . Cervicogenic headache 06/14/2015  . Weakness generalized 11/13/2014  . TIA (transient ischemic attack) 11/13/2014  . Gait instability 11/13/2014  . Acute on chronic diastolic CHF (congestive heart failure), NYHA class 3 ([place]) 11/03/2014  . Obstructive apnea 11/03/2014  . DOE (dyspnea on exertion) 11/03/2014  . Thyroid nodule 04/28/2014  . Personal history of colonic polyps 03/09/2014  . Abdominal pain, unspecified site 03/09/2014  . Dizziness 02/10/2014  . Memory loss 12/30/2013  . Headache 12/30/2013  . Abdominal pain, chronic, right lower quadrant 06/03/2013  .  Hypokalemia 08/15/2011  . Diarrhea 08/15/2011  . Cellulitis of left leg 08/14/2011  . Chest pain 06/08/2011  . HIP PAIN 09/15/2010  . CYSTOCELE WITHOUT MENTION UTERINE PROLAPSE LAT 09/12/2010  . Labyrinthitis 11/03/2009  . Transient cerebral ischemia 10/20/2009  . NEPHROLITHIASIS 04/29/2009  . Peripheral vascular disease ([place]) 01/26/2009  . IRRITABLE BOWEL SYNDROME 01/26/2009  . Edema 09/02/2008  . Type 2 diabetes mellitus ([place]) 08/26/2008  . Hyperlipidemia 08/26/2008  . Narcolepsy without cataplexy 08/26/2008  . HYPOGLYCEMIA 06/23/2008  . Backache 06/23/2008  . Arthropathy 12/11/2007  . Insomnia 12/11/2007  . Esophageal reflux 11/06/2007  . Osteoarthritis 08/07/2007  . OBESITY 03/01/2007  . Depression, recurrent ([place]) 03/01/2007  . SYNDROME, RESTLESS LEGS 03/01/2007  . Essential hypertension 03/01/2007  . Coronary atherosclerosis 03/01/2007    Orientation RESPIRATION BLADDER Height & Weight     Self, Time, Situation, Place  Normal Continent Weight: 233 lb 4.8 oz (105.8 kg) Height:  5\' 8"  (172.7 cm)  BEHAVIORAL SYMPTOMS/MOOD NEUROLOGICAL BOWEL NUTRITION STATUS      Continent Diet (carb modified, low sodium)  AMBULATORY STATUS COMMUNICATION OF NEEDS Skin   Limited Assist Verbally Other (Comment) (diabetic toe ulcer, left leg)                       Personal Care Assistance Level of Assistance  Bathing, Dressing Bathing Assistance: Limited assistance   Dressing Assistance: Limited assistance     Functional Limitations Info             SPECIAL CARE FACTORS FREQUENCY  PT (By licensed PT), OT (By licensed OT)  PT Frequency: 5x/wk OT Frequency: 5x/wk            Contractures      Additional Factors Info  Code Status, Allergies, Insulin Sliding Scale, Isolation Precautions Code Status Info: DNR Allergies Info: ASPIRIN, CIPROFLOXACIN, CLARITHROMYCIN, DOXYCYCLINE, ERYTHROMYCIN, LISINOPRIL, LYRICA PREGABALIN, MACROBID NITROFURANTOIN MONOHYD MACRO,  METOLAZONE, OTHER, PENICILLINS, SULFONAMIDE DERIVATIVES, VALTREX VALACYCLOVIR HCL    Insulin Sliding Scale Info: 0-15 units 3x/day with meals, 0-5 units daily at bedtime Isolation Precautions Info: MRSA     Current Medications (07/24/2017):  This is the current hospital active medication list Current Facility-Administered Medications  Medication Dose Route Frequency Provider Last Rate Last Dose  . 0.9 %  sodium chloride infusion   Intravenous Continuous Eulogio Bear U, DO 50 mL/hr at 07/23/17 0023    . acetaminophen (TYLENOL) tablet 650 mg  650 mg Oral Q4H PRN [name], MD       Or  . acetaminophen (TYLENOL) solution 650 mg  650 mg Per Tube Q4H PRN [name], MD       Or  . acetaminophen (TYLENOL) suppository 650 mg  650 mg Rectal Q4H PRN [name], MD      . albuterol (PROVENTIL) (2.5 MG/3ML) 0.083% nebulizer solution 2.5 mg  2.5 mg Nebulization Q4H PRN [name], MD      . benzonatate (TESSALON) capsule 200 mg  200 mg Oral BID PRN [name], MD      . cephALEXin [facility]) capsule 500 mg  500 mg Oral TID Cristal Ford, DO   500 mg at 07/24/17 5176  . clopidogrel (PLAVIX) tablet 75 mg  75 mg Oral Daily [name], MD   75 mg at 07/24/17 0853  . enoxaparin (LOVENOX) injection 40 mg  40 mg Subcutaneous Q24H [name], MD   40 mg at 07/23/17 2143  . gabapentin (NEURONTIN) capsule 300 mg  300 mg Oral Daily PRN [name], MD   300 mg at 07/23/17 1558  . insulin aspart (novoLOG) injection 0-15 Units  0-15 Units Subcutaneous TID WC [name], MD   2 Units at 07/24/17 [phone]  . insulin aspart (novoLOG) injection 0-5 Units  0-5 Units Subcutaneous QHS [name], MD   2 Units at 07/23/17 2143  . insulin regular human CONCENTRATED (HUMULIN R) 500 UNIT/ML kwikpen 15 Units  15 Units Subcutaneous BID WC Vann, Jessica U, DO   15 Units at 07/23/17 1438  . insulin regular human CONCENTRATED (HUMULIN R) 500 UNIT/ML kwikpen 70 Units  70 Units  Subcutaneous Q breakfast [name], MD   70 Units at 07/24/17 [phone]  . losartan (COZAAR) tablet 50 mg  50 mg Oral Daily [name], MD   50 mg at 07/24/17 0853  . miconazole (MICOTIN) 2 % cream   Topical BID [name], MD      . ondansetron [facility]) injection 4 mg  4 mg Intravenous Q6H PRN Evans Lance, MD      . oxyCODONE-acetaminophen (PERCOCET/ROXICET) 5-325 MG per tablet 1 tablet  1 tablet Oral Q6H PRN [name], MD   1 tablet at 07/23/17 1558   And  . oxyCODONE (Oxy IR/ROXICODONE) immediate release tablet 5 mg  5 mg Oral Q6H PRN [name], MD   5 mg at 07/23/17 1558  . phenazopyridine (PYRIDIUM) tablet 100 mg  100 mg Oral TID PRN Eulogio Bear U, DO   100 mg at 07/21/17 2230  . pramipexole (MIRAPEX) tablet 4 mg  4 mg Oral QHS PRN Cristal Ford, DO  4 mg at 07/23/17 2100  . senna-docusate (Senokot-S) tablet 1 tablet  1 tablet Oral QHS PRN [name], MD      . zolpidem Lorrin Mais) tablet 5 mg  5 mg Oral QHS [name], MD   5 mg at 07/23/17 2142     Discharge Medications: Please see discharge summary for a list of discharge medications.  Relevant Imaging Results:  Relevant Lab Results:   Additional Information SS#: [id number]  Geralynn Ochs, LCSW

## 2017-07-24 NOTE — Care Management Note (Signed)
Case Management Note  Patient Details  Name: KIAIRA POINTER MRN: [id number] Date of Birth: [birth year]  Subjective/Objective:                    Action/Plan: Pt discharging to SNF today. No further needs per CM.   Expected Discharge Date:  07/24/17               Expected Discharge Plan:  Skilled Nursing Facility  In-House Referral:  Clinical Social Work  Discharge planning Services     Post Acute Care Choice:    Choice offered to:     DME Arranged:    DME Agency:     HH Arranged:    [place] Agency:     Status of Service:  Completed, signed off  If discussed at [employer], dates discussed:    Additional Comments:  Pollie Friar, RN 07/24/2017, 2:39 PM

## 2017-07-24 NOTE — Progress Notes (Signed)
Patient's son bought her some desert before afternoon CBG check

## 2017-07-24 NOTE — Progress Notes (Signed)
Occupational Therapy Treatment Patient Details Name: Melanie Mcdonald MRN: [id number] DOB: [birth year] Today's Date: 07/24/2017    History of present illness Pt is a 83 y.o. female presented to the ED with confusion, speech difficulties, and weakness. PMH inclusive of TIA, PVD, narcolepsy, HTN, HLD, DM, depression, CAD, COPD, and chronic pain. MRI shows several subcentimeter foci of acute/early subacute infarcts present within left cerebellar hemisphere, left thalamus, and left parietal subcortical white matter.   OT comments  Pt progressing towards established OT goals and is motivated to participate in therapy. Pt performing grooming and meal prep activity at sink with Min Guard for safety. Pt leaning hips against sink for support and balance during activity. Continue to recommend dc to SNF for further OT to increase safety and independence with ADLs prior to return home. Will continue to follow acutely to facilitate safe dc.   Follow Up Recommendations  SNF;Supervision/Assistance - 24 hour    Equipment Recommendations  None recommended by OT    Recommendations for Other Services      Precautions / Restrictions Precautions Precautions: Fall Restrictions Weight Bearing Restrictions: No       Mobility Bed Mobility Overal bed mobility: Needs Assistance Bed Mobility: Supine to Sit     Supine to sit: Supervision Sit to supine: Supervision   General bed mobility comments: Hob elevated and supervision for safety  Transfers Overall transfer level: Needs assistance Equipment used: Rolling walker (2 wheeled) Transfers: Sit to/from Stand Sit to Stand: Min guard         General transfer comment: Mi nGuard for safety    Balance Overall balance assessment: Needs assistance;History of Falls Sitting-balance support: Feet supported;No upper extremity supported Sitting balance-Leahy Scale: Fair     Standing balance support: During functional activity;Bilateral upper extremity  supported Standing balance-Leahy Scale: Poor Standing balance comment: reliants on UE supprot or with hips against sink during grooming task                           ADL either performed or assessed with clinical judgement   ADL Overall ADL's : Needs assistance/impaired   Eating/Feeding Details (indicate cue type and reason): Pt performed meal prep activity (making iced coffee) with items set up at sink and min gaurd A for safety.  Grooming: Set up;Min guard Grooming Details (indicate cue type and reason): Pt brushes her hair at sink with Min guard for safety. Pt leaning hips again sink for balance and support. VCs to reach back of head                              Functional mobility during ADLs: Rolling walker;Cueing for safety;Min guard General ADL Comments: Pt performing grooming and meal prep activity at sink with Min Guard for safety. Pt requiring increased time to problem solve but dmeosntrating WFL attention and safety awareness. Pt leaning her hips against sink for stability.      Vision       Perception     Praxis      Cognition Arousal/Alertness: Awake/alert Behavior During Therapy: WFL for tasks assessed/performed Overall Cognitive Status: Within Functional Limits for tasks assessed Area of Impairment: Attention;Following commands;Awareness;Problem solving                   Current Attention Level: Selective   Following Commands: Follows multi-step commands consistently   Awareness: Emergent Problem Solving: Slow processing;Requires verbal cues  General Comments: slow processing        Exercises     Shoulder Instructions       General Comments Son present for begining of session    Pertinent Vitals/ Pain       Pain Assessment: No/denies pain  Home Living                                          Prior Functioning/Environment              Frequency  Min 2X/week        Progress Toward  Goals  OT Goals(current goals can now be found in the care plan section)  Progress towards OT goals: Progressing toward goals  Acute Rehab OT Goals Patient Stated Goal: to go home OT Goal Formulation: With patient Time For Goal Achievement: 08/03/17 Potential to Achieve Goals: Good ADL Goals Pt Will Perform Lower Body Bathing: with set-up;with supervision;sit to/from stand;with adaptive equipment Pt Will Perform Lower Body Dressing: with set-up;with supervision;sit to/from stand;with adaptive equipment Pt Will Transfer to Toilet: with modified independence;ambulating Pt Will Perform Toileting - Clothing Manipulation and hygiene: with modified independence;sit to/from stand;sitting/lateral leans  Plan Discharge plan remains appropriate    Co-evaluation                 AM-PAC PT "6 Clicks" Daily Activity     Outcome Measure   Help from another person eating meals?: None Help from another person taking care of personal grooming?: None Help from another person toileting, which includes using toliet, bedpan, or urinal?: A Little Help from another person bathing (including washing, rinsing, drying)?: A Lot Help from another person to put on and taking off regular upper body clothing?: None Help from another person to put on and taking off regular lower body clothing?: A Lot 6 Click Score: 19    End of Session Equipment Utilized During Treatment: Gait belt;Rolling walker  OT Visit Diagnosis: Unsteadiness on feet (R26.81);Muscle weakness (generalized) (M62.81)   Activity Tolerance Patient tolerated treatment well   Patient Left with call bell/phone within reach;in chair;with chair alarm set   Nurse Communication Mobility status    Functional Assessment Tool Used: Clinical judgement Functional Limitation: Self care Self Care Current Status (G9211): At least 20 percent but less than 40 percent impaired, limited or restricted Self Care Goal Status (H4174): At least 1 percent  but less than 20 percent impaired, limited or restricted   Time: 0814-4818 OT Time Calculation (min): 26 min  Charges: OT G-codes **NOT FOR INPATIENT CLASS** Functional Assessment Tool Used: Clinical judgement Functional Limitation: Self care Self Care Current Status (H6314): At least 20 percent but less than 40 percent impaired, limited or restricted Self Care Goal Status (H7026): At least 1 percent but less than 20 percent impaired, limited or restricted OT General Charges $OT Visit: 1 Visit OT Treatments $Self Care/Home Management : 23-37 mins  [place], OTR/L Acute Rehab Pager: [phone] Office: [facility] 07/24/2017, 1:40 PM

## 2017-07-24 NOTE — Progress Notes (Signed)
Report given to [facility] health accepting nurse-[phone]. Patient transported by son to facility.

## 2017-07-24 NOTE — Care Management Important Message (Signed)
Important Message  Patient Details  Name: Melanie Mcdonald MRN: [id number] Date of Birth: 03-07-1933   Medicare Important Message Given:  Yes    Nathen May 07/24/2017, 9:16 AM

## 2017-07-24 NOTE — Discharge Summary (Signed)
Physician Discharge Summary  Melanie Mcdonald IWL:[id number] DOB: 09/12/1933 DOA: 07/19/2017  PCP: Laurey Morale, MD  Admit date: 07/19/2017 Discharge date: 07/24/2017   Recommendations for Outpatient Follow-Up:   close monitoring of blood sugar Follow up with podiatry DNR S/p loop recorder   Discharge Diagnosis:   Principal Problem:   CVA (cerebral vascular accident) [facility]) Active Problems:   Hyperlipidemia   Depression, recurrent ([place])   Narcolepsy without cataplexy   Essential hypertension   Uncontrolled type 2 diabetes mellitus with hyperglycemia, with long-term current use of insulin (HCC)   Candidiasis of genitalia in female   Chest pain   Hyperglycemia   History of TIA (transient ischemic attack)   Discharge disposition:  SNF:  Discharge Condition: Improved.  Diet recommendation: Low sodium, heart healthy.  Carbohydrate-modified.  Regular.  Wound care: None.   History of Present Illness:   Melanie Mcdonald is a 83 y.o. female with medical history significant of TIA; PVD; narcolepsy; HTN; HLD; DM; depression; CAD; COPD; and chronic pain presenting due to "Strokish".  Her son noticed that she was different since Saturday.  She gets confused and can't talk.  She knows what she wants to say but can't get the words out.  Her symptoms are intermittent, but she has been getting chronically weaker the whole time. She had a mini-stroke 4-5 times in the last 3-4 years.  She had one Saturday and it just keeps coming back.  Most times she relaxes and calms down and within 3-4 hours it goes away but this time it kept coming back.  She has a broken rib and this is causing her pain on the left.  She has a vaginal yeast infection from antibiotics.  She has an abscess on her foot that they have been treating with antibiotics; this is getting better.  No dysphagia, just expressive apahsia.  Generalized weakness.  +cough, nonproductive.  +SOB, no wheezing.  Slight diarrhea - 1 loose  stool yesterday; they think this is from the antibiotics.  Sugars usually run a little high (200 range) but not this high.  Since she has been feeling bad she has not been checking her sugars regularly.     Hospital Course by Problem:   Acute CVA -CT head showed no acute abnormality -MRI/MRA brain: Several subcentimeter foci of acute/early subacute infarction within left cerebellar hemisphere, left thalamus, left parietal subcortical white matter. Patent circle of Willis, no high-grade stenosis, large vessel occlusion or aneurysm identified -CTA neck: Known posterior circulation infarcts with no flow-limiting stenosis or visible embolic source. -Hemoglobin A1c 10.9, LDL 42 -Echocardiogram: normal EF with mitral valve stenosis -PT recommended SNF -Continue Plavix -unable to do TEE under sedation--spoke with Dr. Erlinda Hong-- will not pursue under anesthesia-- s/p loop only  Essential Hypertension -Cozaar  Hyperlipidemia -Lipid panel: Total cholesterol 109, HDL 40, LDL 42, triglycerides 136 -Currently on no cholesterol medications  Diabetes mellitus, type II -Poorly controlled, hemoglobin A1c 10.9 -refuses insulin sliding scale -adjust U500  Depression/narcolepsy -Dextroamphetamine held- uses when driving perMAR  Candidiasis -Was given 1 dose of Diflucan IV, continue topical Monistat  Diabetic foot ulcer, left -Being followed by a podiatrist -d/c abx- needs follow up    Medical Consultants:    Neuro  cards   Discharge Exam:   Vitals:   07/24/17 0042 07/24/17 0500  BP: (!) 115/48 (!) 122/58  Pulse: 71 68  Resp: 18 18  Temp: 98.3 F (36.8 C) 98.2 F (36.8 C)  SpO2: 94% 97%  Vitals:   07/23/17 1830 07/23/17 2046 07/24/17 0042 07/24/17 0500  BP: (!) 129/41 139/61 (!) 115/48 (!) 122/58  Pulse: 76 74 71 68  Resp: 18 18 18 18   Temp: 97.8 F (36.6 C) 98 F (36.7 C) 98.3 F (36.8 C) 98.2 F (36.8 C)  TempSrc: Oral Oral Oral Oral  SpO2: 93% 100% 94% 97%    Weight:      Height:        Gen:  NAD    The results of significant diagnostics from this hospitalization (including imaging, microbiology, ancillary and laboratory) are listed below for reference.     Procedures and Diagnostic Studies:   Ct Angio Head W Or Wo Contrast  Result Date: 07/20/2017 CLINICAL DATA:  Embolic stroke on the left.  Left-sided neck pain. EXAM: CT ANGIOGRAPHY HEAD AND NECK TECHNIQUE: Multidetector CT imaging of the head and neck was performed using the standard protocol during bolus administration of intravenous contrast. Multiplanar CT image reconstructions and MIPs were obtained to evaluate the vascular anatomy. Carotid stenosis measurements (when applicable) are obtained utilizing NASCET criteria, using the distal internal carotid diameter as the denominator. CONTRAST:  50 cc Isovue 370 intravenous COMPARISON:  Brain MRI from earlier today. Intracranial MRA 10/10/2016 FINDINGS: CTA NECK FINDINGS Aortic arch: Atheromatous changes. No visualized acute finding. Three vessel branching. Right carotid system: Mild to moderate degree of predominately calcified plaque at the common carotid bifurcation. No stenosis or ulceration. No beading or dissection. Left carotid system: No notable atheromatous changes for age. No stenosis, beading, or ulceration. Vertebral arteries: No proximal subclavian flow limiting stenosis or irregularity. Codominant vertebral arteries that are both smooth and widely patent to the dura Skeleton: No acute or aggressive finding. Other neck: No incidental mass or adenopathy in the neck. Nasal septal perforation. Upper chest: No acute finding. Increased size of mediastinal lymph nodes compared to 2015 chest CT, including a 17 mm short axis right peritracheal node. Review of the MIP images confirms the above findings CTA HEAD FINDINGS Anterior circulation: Overall mild calcified plaque on the carotid siphons. Atherosclerotic irregularity of bilateral medium  size vessels without branch occlusion. No reversible proximal stenosis. Questionable sub mm bulge at the right MCA bifurcation. Posterior circulation: Symmetric vertebral arteries with mild atheromatous change. Vertebral and basilar arteries are smooth and widely patent. Good proximal PCA patency. Fairly symmetric branch visualization. Negative for aneurysm. Venous sinuses: Patent Anatomic variants: None significant Delayed phase: No abnormal intracranial enhancement Review of the MIP images confirms the above findings IMPRESSION: 1. Known posterior circulation infarcts with no flow limiting stenosis or visible embolic source. 2. Mild for age atherosclerosis. 3. Mild mediastinal adenopathy that is increased from a chest CT in 2015. A diagnostic chest CT could further characterize. Electronically Signed   By: Monte Fantasia M.D.   On: 07/20/2017 13:21   Dg Chest 2 View  Result Date: 07/19/2017 CLINICAL DATA:  TIA EXAM: CHEST  2 VIEW COMPARISON:  10/09/2016 FINDINGS: Subsegmental atelectasis at the left base. Borderline to mild cardiomegaly with minimal central congestion. No consolidation or effusion. Aortic atherosclerosis. No pneumothorax. Degenerative changes of the spine. IMPRESSION: 1. Borderline to mild cardiomegaly with mild central congestion. 2. Subsegmental atelectasis at the left base Electronically Signed   By: Donavan Foil M.D.   On: 07/19/2017 23:59   Ct Head Wo Contrast  Result Date: 07/19/2017 CLINICAL DATA:  83 year old female with progressive weakness for 1 week. EXAM: CT HEAD WITHOUT CONTRAST TECHNIQUE: Contiguous axial images were obtained from the base  of the skull through the vertex without intravenous contrast. COMPARISON:  Head CT without contrast 03/30/2017. Brain MRI 10/10/2016, and earlier. FINDINGS: Brain: Cerebral volume remains normal for age. Stable mild for age cerebral white matter hypodensity. No midline shift, ventriculomegaly, mass effect, evidence of mass lesion,  intracranial hemorrhage or evidence of cortically based acute infarction. Vascular: Calcified atherosclerosis at the skull base. No suspicious intracranial vascular hyperdensity. Skull: No acute osseous abnormality identified. Sinuses/Orbits: Visualized paranasal sinuses and mastoids are stable and well pneumatized. Other: No acute orbit or scalp soft tissue findings. IMPRESSION: 1.  No acute intracranial abnormality. 2. Stable non contrast CT appearance of the brain since June, with mild for age chronic white matter changes. Electronically Signed   By: Genevie Ann M.D.   On: 07/19/2017 16:21   Ct Angio Neck W Or Wo Contrast  Result Date: 07/20/2017 CLINICAL DATA:  Embolic stroke on the left.  Left-sided neck pain. EXAM: CT ANGIOGRAPHY HEAD AND NECK TECHNIQUE: Multidetector CT imaging of the head and neck was performed using the standard protocol during bolus administration of intravenous contrast. Multiplanar CT image reconstructions and MIPs were obtained to evaluate the vascular anatomy. Carotid stenosis measurements (when applicable) are obtained utilizing NASCET criteria, using the distal internal carotid diameter as the denominator. CONTRAST:  50 cc Isovue 370 intravenous COMPARISON:  Brain MRI from earlier today. Intracranial MRA 10/10/2016 FINDINGS: CTA NECK FINDINGS Aortic arch: Atheromatous changes. No visualized acute finding. Three vessel branching. Right carotid system: Mild to moderate degree of predominately calcified plaque at the common carotid bifurcation. No stenosis or ulceration. No beading or dissection. Left carotid system: No notable atheromatous changes for age. No stenosis, beading, or ulceration. Vertebral arteries: No proximal subclavian flow limiting stenosis or irregularity. Codominant vertebral arteries that are both smooth and widely patent to the dura Skeleton: No acute or aggressive finding. Other neck: No incidental mass or adenopathy in the neck. Nasal septal perforation. Upper  chest: No acute finding. Increased size of mediastinal lymph nodes compared to 2015 chest CT, including a 17 mm short axis right peritracheal node. Review of the MIP images confirms the above findings CTA HEAD FINDINGS Anterior circulation: Overall mild calcified plaque on the carotid siphons. Atherosclerotic irregularity of bilateral medium size vessels without branch occlusion. No reversible proximal stenosis. Questionable sub mm bulge at the right MCA bifurcation. Posterior circulation: Symmetric vertebral arteries with mild atheromatous change. Vertebral and basilar arteries are smooth and widely patent. Good proximal PCA patency. Fairly symmetric branch visualization. Negative for aneurysm. Venous sinuses: Patent Anatomic variants: None significant Delayed phase: No abnormal intracranial enhancement Review of the MIP images confirms the above findings IMPRESSION: 1. Known posterior circulation infarcts with no flow limiting stenosis or visible embolic source. 2. Mild for age atherosclerosis. 3. Mild mediastinal adenopathy that is increased from a chest CT in 2015. A diagnostic chest CT could further characterize. Electronically Signed   By: Monte Fantasia M.D.   On: 07/20/2017 13:21   Mr Brain Wo Contrast  Result Date: 07/20/2017 CLINICAL DATA:  83 y/o F; weakness, slurred speech, word-finding difficulty. Mild headache. EXAM: MRI HEAD WITHOUT CONTRAST MRA HEAD WITHOUT CONTRAST TECHNIQUE: Multiplanar, multiecho pulse sequences of the brain and surrounding structures were obtained without intravenous contrast. Angiographic images of the head were obtained using MRA technique without contrast. COMPARISON:  07/19/2017 CT of the head.  10/10/2016 MRI of the head. FINDINGS: MRI HEAD FINDINGS Brain: Subcentimeter foci of reduced diffusion compatible with acute/early subacute infarction are present within the left  inferomedial and superolateral cerebellar hemisphere, left ventrolateral thalamus, and scattered  foci are present left parietal subcortical white matter. (Series 3: Image 12, 16, 25, 31, 35). Foci of acute/early subacute infarction demonstrate associated T2 FLAIR hyperintense signal abnormality. No significant mass effect. No abnormal susceptibility hypointensity to indicate intracranial hemorrhage. Stable background of scattered nonspecific foci of T2 FLAIR hyperintensity in subcortical and periventricular white matter is compatible with mild chronic microvascular ischemic changes for age and there is mild brain parenchymal volume loss. No hydrocephalus, extra-axial collection, effacement of basilar cisterns, or herniation. Vascular: As below. Skull and upper cervical spine: Normal marrow signal. Sinuses/Orbits: Mild anterior ethmoid sinus mucosal thickening. Otherwise negative. Other: Bilateral intra-ocular lens replacement. MRA HEAD FINDINGS Internal carotid arteries:  Patent. Anterior cerebral arteries:  Patent. Middle cerebral arteries: Patent. Anterior communicating artery: Patent. Posterior communicating arteries: Patent left. No right identified, likely hypoplastic or absent. Posterior cerebral arteries:  Patent. Basilar artery:  Patent. Vertebral arteries:  Patent. No evidence of high-grade stenosis, large vessel occlusion, or aneurysm identified. IMPRESSION: 1. Several subcentimeter foci of acute/early subacute infarction are present within left cerebellar hemisphere, left thalamus, and left parietal subcortical white matter. No associated mass effect or hemorrhage. Multiple vascular distributions favors embolic etiology. 2. Stable background of mild chronic microvascular ischemic changes and parenchymal volume loss of the brain. 3. Patent circle of Willis. No high-grade stenosis, large vessel occlusion, or aneurysm identified. These results will be called to the ordering clinician or representative by the Radiologist Assistant, and communication documented in the PACS or zVision Dashboard.  Electronically Signed   By: Kristine Garbe M.D.   On: 07/20/2017 00:08   [name] Head Wo Contrast  Result Date: 07/20/2017 CLINICAL DATA:  83 y/o F; weakness, slurred speech, word-finding difficulty. Mild headache. EXAM: MRI HEAD WITHOUT CONTRAST MRA HEAD WITHOUT CONTRAST TECHNIQUE: Multiplanar, multiecho pulse sequences of the brain and surrounding structures were obtained without intravenous contrast. Angiographic images of the head were obtained using MRA technique without contrast. COMPARISON:  07/19/2017 CT of the head.  10/10/2016 MRI of the head. FINDINGS: MRI HEAD FINDINGS Brain: Subcentimeter foci of reduced diffusion compatible with acute/early subacute infarction are present within the left inferomedial and superolateral cerebellar hemisphere, left ventrolateral thalamus, and scattered foci are present left parietal subcortical white matter. (Series 3: Image 12, 16, 25, 31, 35). Foci of acute/early subacute infarction demonstrate associated T2 FLAIR hyperintense signal abnormality. No significant mass effect. No abnormal susceptibility hypointensity to indicate intracranial hemorrhage. Stable background of scattered nonspecific foci of T2 FLAIR hyperintensity in subcortical and periventricular white matter is compatible with mild chronic microvascular ischemic changes for age and there is mild brain parenchymal volume loss. No hydrocephalus, extra-axial collection, effacement of basilar cisterns, or herniation. Vascular: As below. Skull and upper cervical spine: Normal marrow signal. Sinuses/Orbits: Mild anterior ethmoid sinus mucosal thickening. Otherwise negative. Other: Bilateral intra-ocular lens replacement. MRA HEAD FINDINGS Internal carotid arteries:  Patent. Anterior cerebral arteries:  Patent. Middle cerebral arteries: Patent. Anterior communicating artery: Patent. Posterior communicating arteries: Patent left. No right identified, likely hypoplastic or absent. Posterior cerebral  arteries:  Patent. Basilar artery:  Patent. Vertebral arteries:  Patent. No evidence of high-grade stenosis, large vessel occlusion, or aneurysm identified. IMPRESSION: 1. Several subcentimeter foci of acute/early subacute infarction are present within left cerebellar hemisphere, left thalamus, and left parietal subcortical white matter. No associated mass effect or hemorrhage. Multiple vascular distributions favors embolic etiology. 2. Stable background of mild chronic microvascular ischemic changes and parenchymal volume loss  of the brain. 3. Patent circle of Willis. No high-grade stenosis, large vessel occlusion, or aneurysm identified. These results will be called to the ordering clinician or representative by the Radiologist Assistant, and communication documented in the PACS or zVision Dashboard. Electronically Signed   By: Kristine Garbe M.D.   On: 07/20/2017 00:08     Labs:   Basic Metabolic Panel:  Recent Labs Lab 07/19/17 1433 07/21/17 0450 07/23/17 0307  NA 132* 136 137  K 4.4 4.1 4.4  CL 97* 105 106  CO2 26 24 23   GLUCOSE 503* 133* 159*  BUN 23* 23* 18  CREATININE 0.75 0.92 0.69  CALCIUM 9.0 8.3* 8.5*   GFR Estimated Creatinine Clearance: 69.1 mL/min (by C-G formula based on SCr of 0.69 mg/dL). Liver Function Tests:  Recent Labs Lab 07/20/17 0010  AST 14*  ALT 14  ALKPHOS 84  BILITOT 0.5  PROT 7.0  ALBUMIN 3.2*   No results for input(s): LIPASE, AMYLASE in the last 168 hours. No results for input(s): AMMONIA in the last 168 hours. Coagulation profile  Recent Labs Lab 07/19/17 1433  INR 1.04    CBC:  Recent Labs Lab 07/19/17 1433 07/20/17 0010 07/21/17 0450 07/23/17 0307  WBC 6.6  --  7.5 6.3  NEUTROABS  --  2.8  --   --   HGB 13.6  --  12.0 13.0  HCT 41.3  --  37.1 40.4  MCV 87.5  --  88.3 89.2  PLT 157  --  173 164   Cardiac Enzymes:  Recent Labs Lab 07/20/17 0010 07/20/17 0450 07/20/17 0821  TROPONINI <0.03 <0.03 <0.03    BNP: Invalid input(s): POCBNP CBG:  Recent Labs Lab 07/23/17 1128 07/23/17 1313 07/23/17 1730 07/23/17 2108 07/24/17 0625  GLUCAP 221* 183* 243* 214* 144*   D-Dimer No results for input(s): DDIMER in the last 72 hours. Hgb A1c No results for input(s): HGBA1C in the last 72 hours. Lipid Profile No results for input(s): CHOL, HDL, LDLCALC, TRIG, CHOLHDL, LDLDIRECT in the last 72 hours. Thyroid function studies No results for input(s): TSH, T4TOTAL, T3FREE, THYROIDAB in the last 72 hours.  Invalid input(s): FREET3 Anemia work up No results for input(s): VITAMINB12, FOLATE, FERRITIN, TIBC, IRON, RETICCTPCT in the last 72 hours. Microbiology Recent Results (from the past 240 hour(s))  MRSA PCR Screening     Status: None   Collection Time: 07/19/17 10:53 PM  Result Value Ref Range Status   MRSA by PCR NEGATIVE NEGATIVE Final    Comment:        The GeneXpert MRSA Assay (FDA approved for NASAL specimens only), is one component of a comprehensive MRSA colonization surveillance program. It is not intended to diagnose MRSA infection nor to guide or monitor treatment for MRSA infections.      Discharge Instructions:   Discharge Instructions    Ambulatory referral to Neurology    Complete by:  As directed    Pt will follow up with Dr[facility] Delice [facility]ch at [facility] in about 6 weeks. Thanks.   Diet - low sodium heart healthy    Complete by:  As directed    Diet Carb Modified    Complete by:  As directed    Discharge instructions    Complete by:  As directed    close monitoring of blood sugar Follow up with podiatry DNR   Increase activity slowly    Complete by:  As directed      Allergies as of 07/24/2017  Reactions   Aspirin Other (See Comments)   Abdominal pain   Ciprofloxacin Nausea And Vomiting   Made pt very sick on stomach   Clarithromycin    Made stomach "burn"   Doxycycline Itching   Erythromycin Nausea And Vomiting   Lisinopril Cough   Lyrica [pregabalin]  Other (See Comments)   Suicidal    Macrobid [nitrofurantoin Monohyd Macro] Nausea And Vomiting   Metolazone Other (See Comments)   Pt stated this made her B/P drop   Other Nausea And Vomiting   Pt cannot have any of the -mycin(s)   Penicillins Swelling   From childhood: swelling @ injection site Has patient had a PCN reaction causing immediate rash, facial/tongue/throat swelling, SOB or lightheadedness with hypotension: Yes Has patient had a PCN reaction causing severe rash involving mucus membranes or skin necrosis: No Has patient had a PCN reaction that required hospitalization: No Has patient had a PCN reaction occurring within the last 10 years: No If all of the above answers are "NO", then may proceed with Cephalosporin use.   Sulfonamide Derivatives Nausea And Vomiting   Valtrex [valacyclovir Hcl] Nausea Only      Medication List    STOP taking these medications   cephALEXin 500 MG capsule Commonly known as:  KEFLEX   dextroamphetamine 10 MG 24 hr capsule Commonly known as:  DEXEDRINE SPANSULE   hydrocortisone 5 MG tablet Commonly known as:  CORTEF   nitroGLYCERIN 0.4 MG SL tablet Commonly known as:  NITROSTAT     TAKE these medications   albuterol 108 (90 Base) MCG/ACT inhaler Commonly known as:  PROVENTIL HFA;VENTOLIN HFA Inhale 2 puffs into the lungs every 4 (four) hours as needed for wheezing or shortness of breath.   AZO-TABS 95 MG tablet Generic drug:  phenazopyridine Take 95 mg by mouth 3 (three) times daily as needed for pain.   benzonatate 200 MG capsule Commonly known as:  TESSALON Take 200 mg by mouth 2 (two) times daily as needed for cough.   clopidogrel 75 MG tablet Commonly known as:  PLAVIX Take 1 tablet (75 mg total) by mouth daily.   ENSURE Take 237 mLs by mouth 4 (four) times daily. Reported on 02/08/2016   furosemide 40 MG tablet Commonly known as:  LASIX Take 1 tablet (40 mg total) by mouth 2 (two) times daily as needed for fluid or  edema (in the legs/feet). 40 mg two times a day as needed for edema or fluid What changed:  when to take this  reasons to take this  additional instructions   gabapentin 300 MG capsule Commonly known as:  NEURONTIN Take 1 capsule (300 mg total) by mouth daily as needed (for nerve pain in the feet).   glucose blood test strip Commonly known as:  ACCU-CHEK AVIVA Test once per day and diagnosis code is E 11.9   insulin aspart 100 UNIT/ML injection Commonly known as:  novoLOG Inject 0-15 Units into the skin 3 (three) times daily with meals.   insulin aspart 100 UNIT/ML injection Commonly known as:  novoLOG Inject 0-5 Units into the skin at bedtime.   insulin regular human CONCENTRATED 500 UNIT/ML kwikpen Commonly known as:  HUMULIN R U-500 KWIKPEN Inject 70 Units into the skin daily with breakfast. What changed:  You were already taking a medication with the same name, and this prescription was added. Make sure you understand how and when to take each.   insulin regular human CONCENTRATED 500 UNIT/ML kwikpen Commonly known as:  HUMULIN R U-500 KWIKPEN Inject 15 Units into the skin 2 (two) times daily with a meal. Lunch and dinner What changed:  how much to take  how to take this  when to take this  additional instructions   ketoconazole 2 % cream Commonly known as:  NIZORAL Apply 1 application topically 2 (two) times daily as needed for irritation (between bikini lines).   losartan 50 MG tablet Commonly known as:  COZAAR Take 1 tablet (50 mg total) by mouth daily.   MOVE FREE JOINT HEALTH ADVANCE Tabs Take 1 tablet by mouth 2 (two) times daily.   ondansetron 8 MG tablet Commonly known as:  ZOFRAN Take 1 tablet (8 mg total) by mouth every 8 (eight) hours as needed for nausea or vomiting.   OVER THE COUNTER MEDICATION [employer] caplets for Leg Cramps (Cinchona Off. 3X HPUS; Viscum Alb. 3X HPUS; Gnaphalium Poly. 3X HPUS; Rhus Tox. 6X HPUS; Aconitum Nap. 6X HPUS; Ledum  Pal. 6X HPUS; Magnesia Phos. 6X HPUS. Inactive Ingredients: Lactose NF, Microcrystalline Cellulose, and Vegetable Magnesium Stearate): Take 2 caplets by mouth up to four times a day as needed for leg cramps   oxyCODONE-acetaminophen 10-325 MG tablet Commonly known as:  PERCOCET Take 1 tablet by mouth every 6 (six) hours as needed for pain.   pramipexole 1 MG tablet Commonly known as:  MIRAPEX TAKE 5mg =5 TABLETS BY MOUTH daily at bedtime What changed:  how much to take  how to take this  when to take this  reasons to take this  additional instructions   VICKS VAPORUB EX Apply 1 application topically daily as needed (for congestion).   zolpidem 10 MG tablet Commonly known as:  AMBIEN TAKE 1 TABLET BY MOUTH AT BEDTIME What changed:  See the new instructions.      Follow-up Information    Cameron Sprang, MD. Schedule an appointment as soon as possible for a visit in 6 week(s).   Specialty:  Neurology Contact information: [place] [address] [place] [address] [phone]        [place] [facility] Follow up on 08/06/2017.   Specialty:  Cardiology Why:  3:30PM, wound check visit Contact information: [address], [place]           Time coordinating discharge: 35 min  Signed:  Eulla Kochanowski Alison Stalling   [facility] 07/24/2017, 8:38 AM

## 2017-07-25 SURGERY — ECHOCARDIOGRAM, TRANSESOPHAGEAL
Anesthesia: Monitor Anesthesia Care

## 2017-07-30 ENCOUNTER — Encounter (HOSPITAL_COMMUNITY): Payer: Self-pay

## 2017-07-30 ENCOUNTER — Emergency Department (HOSPITAL_COMMUNITY)
Admission: EM | Admit: 2017-07-30 | Discharge: 2017-07-30 | Disposition: A | Discharge status: Home / Self Care | Payer: Medicare Other | Attending: Emergency Medicine | Admitting: Emergency Medicine

## 2017-07-30 DIAGNOSIS — E1142 Type 2 diabetes mellitus with diabetic polyneuropathy: Secondary | ICD-10-CM | POA: Diagnosis not present

## 2017-07-30 DIAGNOSIS — I110 Hypertensive heart disease with heart failure: Secondary | ICD-10-CM | POA: Diagnosis not present

## 2017-07-30 DIAGNOSIS — Z794 Long term (current) use of insulin: Secondary | ICD-10-CM | POA: Insufficient documentation

## 2017-07-30 DIAGNOSIS — Y929 Unspecified place or not applicable: Secondary | ICD-10-CM | POA: Diagnosis not present

## 2017-07-30 DIAGNOSIS — Y939 Activity, unspecified: Secondary | ICD-10-CM | POA: Insufficient documentation

## 2017-07-30 DIAGNOSIS — Y999 Unspecified external cause status: Secondary | ICD-10-CM | POA: Insufficient documentation

## 2017-07-30 DIAGNOSIS — Z8673 Personal history of transient ischemic attack (TIA), and cerebral infarction without residual deficits: Secondary | ICD-10-CM | POA: Diagnosis not present

## 2017-07-30 DIAGNOSIS — Z79899 Other long term (current) drug therapy: Secondary | ICD-10-CM | POA: Diagnosis not present

## 2017-07-30 DIAGNOSIS — S81811A Laceration without foreign body, right lower leg, initial encounter: Secondary | ICD-10-CM | POA: Insufficient documentation

## 2017-07-30 DIAGNOSIS — I5033 Acute on chronic diastolic (congestive) heart failure: Secondary | ICD-10-CM | POA: Insufficient documentation

## 2017-07-30 DIAGNOSIS — I2510 Atherosclerotic heart disease of native coronary artery without angina pectoris: Secondary | ICD-10-CM | POA: Insufficient documentation

## 2017-07-30 DIAGNOSIS — J449 Chronic obstructive pulmonary disease, unspecified: Secondary | ICD-10-CM | POA: Insufficient documentation

## 2017-07-30 DIAGNOSIS — W228XXA Striking against or struck by other objects, initial encounter: Secondary | ICD-10-CM | POA: Insufficient documentation

## 2017-07-30 MED ORDER — HYDROCODONE-ACETAMINOPHEN 5-325 MG PO TABS
1.0000 | ORAL_TABLET | Freq: Once | ORAL | Status: AC
  Administered 2017-07-30: 1 via ORAL
  Filled 2017-07-30: qty 1

## 2017-07-30 NOTE — Discharge Instructions (Signed)
Change the dressing every 24 hours. Follow up sooner for any sign of infection

## 2017-07-30 NOTE — ED Provider Notes (Signed)
[facility] DEPT Provider Note   CSN: [id number] Arrival date & time: 07/30/17  1521     History   Chief Complaint Chief Complaint  Patient presents with  . Laceration    HPI Melanie Mcdonald is a 83 y.o. female.  Pt comes in with c/o laceration to the right now leg that occurred a short time ago when the car door hit her leg. There was no fall associated with the injury. The door hit her leg when getting out. Tetanus it utd. She states that she thinks she may be on a blood thinner but she is unsure what one. She states that she had a stroke 2 weeks ago and is in rehab and was just going to lunch with a family member today.      Past Medical History:  Diagnosis Date  . ANEMIA 01/30/2008  . ARTHRITIS 12/11/2007  . BACK PAIN, CHRONIC 06/23/2008  . BREAST CYST, RIGHT 12/17/2007  . Colon polyps    FRAGMENTS OF HYPERPLASTIC POLYP  . CONTACT DERMATITIS 03/10/2009  . COPD (chronic obstructive pulmonary disease) ([place])   . CORONARY ARTERY DISEASE 03/01/2007   had a normal Myoview stress test 07-13-11  . CYSTOCELE WITHOUT MENTION UTERINE PROLAPSE LAT 09/12/2010  . DEGENERATIVE JOINT DISEASE 08/07/2007  . DEPRESSION 03/01/2007  . DIABETES MELLITUS, TYPE II 08/26/2008   sees Dr. Buddy Duty   . Diverticulosis of colon (without mention of hemorrhage)   . Esophageal reflux 11/06/2007  . HYPERKERATOSIS 06/02/2009  . HYPERLIPIDEMIA 08/26/2008  . HYPERTENSION 03/01/2007  . Irritable bowel syndrome 01/26/2009  . LABYRINTHITIS 11/03/2009  . 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 NEPHROLITHIASIS 04/29/2009  . OBESITY 03/01/2007  . PERIPHERAL VASCULAR DISEASE 01/26/2009  . SYNCOPE 08/26/2008   had brain MRI on 06-28-12 showing only chronic microvascular ischemia and atrophy   . TRANSIENT ISCHEMIC ATTACK 10/20/2009   had normal brain MRA with patent vertebrals and carotids 06-28-12    Patient Active Problem List   Diagnosis Date Noted  . Hyperglycemia   . History of  TIA (transient ischemic attack)   . CVA (cerebral vascular accident) ([place]) 07/20/2017  . Candidiasis of genitalia in female 07/20/2017  . Chest pain 07/20/2017  . Weakness 07/19/2017  . Uncontrolled type 2 diabetes mellitus with hyperglycemia, with long-term current use of insulin ([place]) 10/09/2016  . Dysarthria 10/09/2016  . Cellulitis of right foot 06/13/2016  . Gout 06/07/2016  . Leg swelling 04/05/2016  . Pain in joint, ankle and foot 12/24/2015  . Diabetic polyneuropathy associated with diabetes mellitus due to underlying condition ([place]) 06/14/2015  . Cervicogenic headache 06/14/2015  . Weakness generalized 11/13/2014  . TIA (transient ischemic attack) 11/13/2014  . Gait instability 11/13/2014  . Acute on chronic diastolic CHF (congestive heart failure), NYHA class 3 ([place]) 11/03/2014  . Obstructive apnea 11/03/2014  . DOE (dyspnea on exertion) 11/03/2014  . Thyroid nodule 04/28/2014  . Personal history of colonic polyps 03/09/2014  . Abdominal pain, unspecified site 03/09/2014  . Dizziness 02/10/2014  . Memory loss 12/30/2013  . Headache 12/30/2013  . Abdominal pain, chronic, right lower quadrant 06/03/2013  . Hypokalemia 08/15/2011  . Diarrhea 08/15/2011  . Cellulitis of left leg 08/14/2011  . Chest pain 06/08/2011  . HIP PAIN 09/15/2010  . CYSTOCELE WITHOUT MENTION UTERINE PROLAPSE LAT 09/12/2010  . Labyrinthitis 11/03/2009  . Transient cerebral ischemia 10/20/2009  . NEPHROLITHIASIS 04/29/2009  . Peripheral vascular disease ([place]) 01/26/2009  . IRRITABLE BOWEL SYNDROME 01/26/2009  . Edema 09/02/2008  .  Type 2 diabetes mellitus ([place]) 08/26/2008  . Hyperlipidemia 08/26/2008  . Narcolepsy without cataplexy 08/26/2008  . HYPOGLYCEMIA 06/23/2008  . Backache 06/23/2008  . Arthropathy 12/11/2007  . Insomnia 12/11/2007  . Esophageal reflux 11/06/2007  . Osteoarthritis 08/07/2007  . OBESITY 03/01/2007  . Depression, recurrent ([place]) 03/01/2007  . SYNDROME, RESTLESS LEGS  03/01/2007  . Essential hypertension 03/01/2007  . Coronary atherosclerosis 03/01/2007    Past Surgical History:  Procedure Laterality Date  . ABDOMINAL HYSTERECTOMY    . BACK SURGERY     x2  . CARDIAC CATHETERIZATION  07/2009  . CATARACT EXTRACTION    . COLONOSCOPY  11-13-07   per Dr. Deatra Ina, benign polyps, repeat in 5 yrs  . ESOPHAGOGASTRODUODENOSCOPY  11-13-07   per Dr. Deatra Ina, normal   . INTRAOCULAR LENS INSERTION    . LOOP RECORDER INSERTION N/A 07/23/2017   Procedure: LOOP RECORDER INSERTION;  Surgeon: Evans Lance, MD;  Location: [facility];  Service: Cardiovascular;  Laterality: N/A;  . SPINE SURGERY     x 3  . TONSILECTOMY, ADENOIDECTOMY, BILATERAL MYRINGOTOMY AND TUBES      OB History    No data available       Home Medications    Prior to Admission medications   Medication Sig Start Date End Date Taking? Authorizing Provider  albuterol (PROVENTIL HFA;VENTOLIN HFA) 108 (90 Base) MCG/ACT inhaler Inhale 2 puffs into the lungs every 4 (four) hours as needed for wheezing or shortness of breath. 01/16/17   Nafziger, Tommi Rumps, NP  benzonatate (TESSALON) 200 MG capsule Take 200 mg by mouth 2 (two) times daily as needed for cough.    [provider]  Camphor-Eucalyptus-Menthol (VICKS VAPORUB EX) Apply 1 application topically daily as needed (for congestion).    [provider]  clopidogrel (PLAVIX) 75 MG tablet Take 1 tablet (75 mg total) by mouth daily. 07/24/17   Geradine Girt, DO  ENSURE (ENSURE) Take 237 mLs by mouth 4 (four) times daily. Reported on 02/08/2016    [provider]  furosemide (LASIX) 40 MG tablet Take 1 tablet (40 mg total) by mouth 2 (two) times daily as needed for fluid or edema (in the legs/feet). 40 mg two times a day as needed for edema or fluid 07/24/17   Eulogio Bear U, DO  gabapentin (NEURONTIN) 300 MG capsule Take 1 capsule (300 mg total) by mouth daily as needed (for nerve pain in the feet). 07/24/17   Geradine Girt, DO  Glucos-Chond-Hyal Ac-Ca Fructo (MOVE FREE JOINT HEALTH ADVANCE) TABS Take 1 tablet by mouth 2 (two) times daily.    [provider]  glucose blood (ACCU-CHEK AVIVA) test strip Test once per day and diagnosis code is E 11.9 06/28/16   Laurey Morale, MD  insulin aspart (NOVOLOG) 100 UNIT/ML injection Inject 0-15 Units into the skin 3 (three) times daily with meals. 07/24/17   Geradine Girt, DO  insulin aspart (NOVOLOG) 100 UNIT/ML injection Inject 0-5 Units into the skin at bedtime. 07/24/17   Geradine Girt, DO  insulin regular human CONCENTRATED (HUMULIN R U-500 KWIKPEN) 500 UNIT/ML kwikpen Inject 70 Units into the skin daily with breakfast. 07/24/17   Geradine Girt, DO  insulin regular human CONCENTRATED (HUMULIN R U-500 KWIKPEN) 500 UNIT/ML kwikpen Inject 15 Units into the skin 2 (two) times daily with a meal. Lunch and dinner 07/24/17   Geradine Girt, DO  ketoconazole (NIZORAL) 2 % cream Apply 1 application topically 2 (two) times  daily as needed for irritation (between bikini lines).    [provider]  losartan (COZAAR) 50 MG tablet Take 1 tablet (50 mg total) by mouth daily. 07/13/17   Laurey Morale, MD  ondansetron (ZOFRAN) 8 MG tablet Take 1 tablet (8 mg total) by mouth every 8 (eight) hours as needed for nausea or vomiting. 06/13/16   Laurey Morale, MD  OVER THE COUNTER MEDICATION [employer] caplets for Leg Cramps (Cinchona Off. 3X HPUS; Viscum Alb. 3X HPUS; Gnaphalium Poly. 3X HPUS; Rhus Tox. 6X HPUS; Aconitum Nap. 6X HPUS; Ledum Pal. 6X HPUS; Magnesia Phos. 6X HPUS. Inactive Ingredients: Lactose NF, Microcrystalline Cellulose, and Vegetable Magnesium Stearate): Take 2 caplets by mouth up to four times a day as needed for leg cramps    [provider]  oxyCODONE-acetaminophen (PERCOCET) 10-325 MG tablet Take 1 tablet by mouth every 6 (six) hours as needed for pain. 07/24/17   Geradine Girt, DO  phenazopyridine (AZO-TABS) 95 MG tablet Take 95 mg by  mouth 3 (three) times daily as needed for pain.    [provider]  pramipexole (MIRAPEX) 1 MG tablet TAKE 5mg =5 TABLETS BY MOUTH daily at bedtime Patient taking differently: Take 4-5 mg by mouth at bedtime as needed (for restless legs).  10/11/16   Donzetta Starch, NP  zolpidem (AMBIEN) 10 MG tablet TAKE 1 TABLET BY MOUTH AT BEDTIME Patient taking differently: Take 10 mg by mouth at bedtime 07/10/17   Laurey Morale, MD    Family History Family History  Problem Relation Age of Onset  . Lupus Mother   . COPD Father     Social History Social History  Substance Use Topics  . Smoking status: Never Smoker  . Smokeless tobacco: Never Used  . Alcohol use No     Allergies   Aspirin; Ciprofloxacin; Clarithromycin; Doxycycline; Erythromycin; Lisinopril; Lyrica [pregabalin]; Macrobid [nitrofurantoin monohyd macro]; Metolazone; Other; Penicillins; Sulfonamide derivatives; and Valtrex [valacyclovir hcl]   Review of Systems Review of Systems  All other systems reviewed and are negative.    Physical Exam Updated Vital Signs BP (!) 164/85 (BP Location: Right Arm)   Pulse 81   Temp 98.3 F (36.8 C) (Oral)   Resp 18   Ht 5\' 8"  (1.727 m)   Wt 104.3 kg (230 lb)   SpO2 97%   BMI 34.97 kg/m   Physical Exam  Constitutional: She appears well-developed and well-nourished.  Cardiovascular: Normal rate.   Pulmonary/Chest: Effort normal.  Neurological: She is alert.  Skin:  V shaped skin tear to the left lower leg. Bleeding intact. Pulses intact.  Psychiatric: She has a normal mood and affect.  Nursing note and vitals reviewed.    ED Treatments / Results  Labs (all labs ordered are listed, but only abnormal results are displayed) Labs Reviewed - No data to display  EKG  EKG Interpretation None       Radiology No results found.  Procedures .Marland KitchenLaceration Repair Date/Time: 07/30/2017 5:36 PM Performed by: Glendell Docker Authorized by: Glendell Docker    Consent:    Consent obtained:  Verbal   Consent given by:  Patient   Risks discussed:  Infection   Alternatives discussed:  No treatment and delayed treatment Laceration details:    Location:  Leg   Leg location:  R lower leg   Length (cm):  8 Repair type:    Repair type:  Simple Exploration:    Contaminated: no   Treatment:    Area cleansed with:  Hibiclens   Amount of cleaning:  Standard Skin repair:    Repair method:  Steri-Strips and tissue adhesive   Number of Steri-Strips:  4 Approximation:    Approximation:  Close   Vermilion border: well-aligned   Post-procedure details:    Patient tolerance of procedure:  Tolerated well, no immediate complications    (including critical care time)  Medications Ordered in ED Medications - No data to display   Initial Impression / Assessment and Plan / ED Course  I have reviewed the triage vital signs and the nursing notes.  Pertinent labs & imaging results that were available during my care of the patient were reviewed by me and considered in my medical decision making (see chart for details).     Wound closed . Discussed wound care with pt and daughter  Final Clinical Impressions(s) / ED Diagnoses   Final diagnoses:  None    New Prescriptions New Prescriptions   No medications on file     Glendell Docker, NP 07/30/17 1819    Lacretia Leigh, MD 07/30/17 2042

## 2017-07-30 NOTE — ED Triage Notes (Signed)
Pt presents after cutting R lateral leg on car door this afternoon.  Pain score 8/10.  Pressure dressing applied and bleeding currently controlled.  Pt is on blood thinners.

## 2017-08-01 ENCOUNTER — Ambulatory Visit (INDEPENDENT_AMBULATORY_CARE_PROVIDER_SITE_OTHER): Payer: Medicare Other | Admitting: Podiatry

## 2017-08-01 DIAGNOSIS — L97421 Non-pressure chronic ulcer of left heel and midfoot limited to breakdown of skin: Secondary | ICD-10-CM

## 2017-08-01 DIAGNOSIS — S81811D Laceration without foreign body, right lower leg, subsequent encounter: Secondary | ICD-10-CM

## 2017-08-01 DIAGNOSIS — E11621 Type 2 diabetes mellitus with foot ulcer: Secondary | ICD-10-CM

## 2017-08-01 DIAGNOSIS — L97519 Non-pressure chronic ulcer of other part of right foot with unspecified severity: Secondary | ICD-10-CM

## 2017-08-01 DIAGNOSIS — R600 Localized edema: Secondary | ICD-10-CM | POA: Diagnosis not present

## 2017-08-01 NOTE — Progress Notes (Addendum)
  Subjective:  Patient ID: Melanie Mcdonald, female    DOB: [birth year],  MRN: [id number]  83 y.o. female returns for Follow-up left heel wound. Wound looks better.  Has been in a SNF due to a stroke that occurred 2 weeks ago. New complaint of right distal shin "gash" from car door that occurred 2 days ago.  Was seen in the ER where Dermabond was applied.  States the area has been heavily draining  Objective:  There were no vitals filed for this visit. General AA&O x3. Normal mood and affect.  Vascular Dorsalis pedis and posterior tibial pulses  present 1+ left  Capillary refill normal to all digits. Pedal hair growth normal.  Neurologic Epicritic sensation grossly present.  Dermatologic (Wound) Wound Location: L heel Wound Measurement: Healed Wound Base: Epithelialized Peri-wound: soft Exudate: None: wound tissue dry Wound progress: Improved since last check.  Right leg laceration L shaped with dusky flap.  Thin layer of Dermabond applied.  Active serous drainage.  No erythema.  No signs of infection  Orthopedic: MMT 5/5 in dorsiflexion, plantarflexion, inversion, and eversion. Normal lower extremity joint ROM without pain or crepitus.   Assessment & Plan:  Patient was evaluated and treated and all questions answered.  L Heel Wound -Healed.  We will continue to monitor. -No dressing applied today  Right shin laceration -Dermabond removed. -Unna boot applied for edema control and for wound healing of the laceration. -Advised that there is a flap of skin that may be slow to heal  Follow-up Friday for removal of Unna boot

## 2017-08-01 NOTE — Progress Notes (Signed)
  Subjective:  Patient ID: Melanie Mcdonald, female    DOB: [birth year],  MRN: [id number]  No chief complaint on file.  83 y.o. female returns for the above complaint.   Objective:  There were no vitals filed for this visit.  Assessment & Plan:  Patient was evaluated and treated and all questions answered.   Return in about 1 week (around 07/20/2017).

## 2017-08-03 ENCOUNTER — Telehealth: Payer: Self-pay | Admitting: Internal Medicine

## 2017-08-03 ENCOUNTER — Encounter: Payer: Self-pay | Admitting: Podiatry

## 2017-08-03 ENCOUNTER — Ambulatory Visit (INDEPENDENT_AMBULATORY_CARE_PROVIDER_SITE_OTHER): Payer: Medicare Other | Admitting: Podiatry

## 2017-08-03 ENCOUNTER — Telehealth: Payer: Self-pay | Admitting: *Deleted

## 2017-08-03 DIAGNOSIS — L97519 Non-pressure chronic ulcer of other part of right foot with unspecified severity: Secondary | ICD-10-CM | POA: Diagnosis not present

## 2017-08-03 DIAGNOSIS — S81811D Laceration without foreign body, right lower leg, subsequent encounter: Secondary | ICD-10-CM | POA: Diagnosis not present

## 2017-08-03 DIAGNOSIS — L97421 Non-pressure chronic ulcer of left heel and midfoot limited to breakdown of skin: Secondary | ICD-10-CM | POA: Diagnosis not present

## 2017-08-03 DIAGNOSIS — E11621 Type 2 diabetes mellitus with foot ulcer: Secondary | ICD-10-CM

## 2017-08-03 NOTE — Telephone Encounter (Signed)
Patient reports gauze with Tegaderm over her steri strips currently. I explained that she could remove Tegaderm and gauze but to leave the steristips until her appt. On Monday. Patient verbalized understanding.

## 2017-08-03 NOTE — Telephone Encounter (Signed)
New message    Pt is calling to find out if she needs to leave the bandage on until her appt on Monday or if she can take it off.

## 2017-08-03 NOTE — Progress Notes (Signed)
  Subjective:  Patient ID: Melanie Mcdonald, female    DOB: 1932-10-09,  MRN: [id number]  Chief Complaint  Patient presents with  . Foot Ulcer    Follow up ulcer left heel and hallux right   "Seems to be doing fine"  . Skin Problem    Follow up laceration right lateral leg   "Its so sore and its been leaking"   83 y.o. female returns for the above complaint. States that her ulcers have been doing well. Reports the R leg wound has been leaking and the area is sore.  Objective:  There were no vitals filed for this visit. General AA&O x3. Normal mood and affect.  Vascular Foot warm. Bilateral leg pitting edema.  Neurologic Epicritic sensation grossly present.  Dermatologic R leg ulcer L shaped. Skin flap with dusky color. Active serous drainage. No erythema. No purulence. No signs of infection.  L heel wound epithealized. Surrounding hyperkeratosis.  Orthopedic: MMT 5/5 in dorsiflexion, plantarflexion, inversion, and eversion. Normal joint ROM without pain or crepitus.   Assessment & Plan:  Patient was evaluated and treated and all questions answered.  R Leg Wound -Iodosorb applied today. -Will attempt to get [facility] for home dressing changes. -Daily dressing changes with Iodosorb and bolster dressing.  L Heel Wound -Heel wound epithelialized.  R Hallux Pre-ulcerative callus -No signs of active ulceration today.  Return in about 5 days (around 08/08/2017).

## 2017-08-03 NOTE — Telephone Encounter (Signed)
Melanie Mcdonald - [facility] states pt refuses home health care, prefers personal care.

## 2017-08-05 NOTE — Telephone Encounter (Signed)
Noted thanks... I talked to her about it at her visit and she said she was interested. Guess she changed her mind.

## 2017-08-06 ENCOUNTER — Other Ambulatory Visit: Payer: Self-pay | Admitting: *Deleted

## 2017-08-06 ENCOUNTER — Ambulatory Visit (INDEPENDENT_AMBULATORY_CARE_PROVIDER_SITE_OTHER): Payer: Self-pay | Admitting: *Deleted

## 2017-08-06 DIAGNOSIS — I6340 Cerebral infarction due to embolism of unspecified cerebral artery: Secondary | ICD-10-CM

## 2017-08-06 LAB — CUP PACEART INCLINIC DEVICE CHECK
Date Time Interrogation Session: 20181105154738
MDC IDC PG IMPLANT DT: 20181022

## 2017-08-06 NOTE — Progress Notes (Signed)
Wound check appointment. Steri-strips removed. Wound without redness or edema. Incision edges approximated, wound well healed. Loop check in clinic. Battery status: good. R-waves 0.44mV. 0 symptom episodes, 0 tachy episodes, 0 pause episodes, 0 brady episodes. 0 AF episodes. Monthly summary reports and ROV with GT PRN

## 2017-08-06 NOTE — Patient Outreach (Signed)
[facility]) Care Management  08/06/2017  Melanie Mcdonald 07/07/1933 [id number]  Referral via EMMI-Stroke -Red Alert-Day #9, 08/03/2017 Reason; lost  of interest in things, sad, anxious  Telephone call to patient to address EMMI-call. Patient gave HIPPA verification & confirmed that she has received automated call.   States she has been sad but not in a severe state. States she has had some depression in the past & has talked to primary care provider about it. States she is not having trouble sleeping & takes a sleeping pill. States she feels comfortable talking to her primary care provider if more severe symptoms occur.   Voices that she was recently admitted to hospital with difficulty talking & weakness. States was diagnosed with stroke and that she has had strokes in the past. States speech problem has resolved but she does have problem with short term memory.  Patient states she has not had any further episodes of stroke symptoms & knows to call 911 if she has symptoms.   States she was discharged from hospital to skilled facility-[facility]. Voices that she was discharged recently from nursing home & is home now.  Voices that her son is staying with her at night but works during the day. States he provides meals for her.   States she is managing her own medications. Voices that she has appointment today to get heart device checked (loop recorder). States friend will provider transportation.    Patient voices that she has all of medications. States she has diabetes & is checking blood sugar 4 times daily.  States blood sugar sometimes drops during the night & she drinks ensure. Patient voices she has appointment Nov 14 to see primary care provider. Patient advised to call primary care provider to advise of her discharge from nursing home and of her drop in blood sugar recently. Patient advised to take her medications with her to MD appointment.   Patient voices  understanding & states she plans to call primary care office to get earlier appointment to be seen since nursing home discharge.   States she does not currently need educational literature on stroke.  EMMI call completed.  Plan: Send to care management assistant to close out.  Sherrin Daisy, RN BSN [facility] Management Coordinator [facility] Care Management  [phone]

## 2017-08-08 ENCOUNTER — Ambulatory Visit: Payer: Medicare Other | Admitting: Podiatry

## 2017-08-08 DIAGNOSIS — L97421 Non-pressure chronic ulcer of left heel and midfoot limited to breakdown of skin: Secondary | ICD-10-CM | POA: Diagnosis not present

## 2017-08-08 DIAGNOSIS — S81811D Laceration without foreign body, right lower leg, subsequent encounter: Secondary | ICD-10-CM

## 2017-08-08 DIAGNOSIS — E11621 Type 2 diabetes mellitus with foot ulcer: Secondary | ICD-10-CM | POA: Diagnosis not present

## 2017-08-08 DIAGNOSIS — L97519 Non-pressure chronic ulcer of other part of right foot with unspecified severity: Secondary | ICD-10-CM | POA: Diagnosis not present

## 2017-08-08 MED ORDER — FLUCONAZOLE 150 MG PO TABS: 150.0000 mg | ORAL_TABLET | ORAL | 1 refills | Status: DC

## 2017-08-10 ENCOUNTER — Ambulatory Visit: Payer: Medicare Other | Admitting: Family Medicine

## 2017-08-10 ENCOUNTER — Encounter: Payer: Self-pay | Admitting: Family Medicine

## 2017-08-10 VITALS — BP 150/76 | Temp 98.4°F | Ht 68.0 in | Wt 233.0 lb

## 2017-08-10 DIAGNOSIS — I10 Essential (primary) hypertension: Secondary | ICD-10-CM

## 2017-08-10 DIAGNOSIS — I639 Cerebral infarction, unspecified: Secondary | ICD-10-CM | POA: Diagnosis not present

## 2017-08-10 DIAGNOSIS — R531 Weakness: Secondary | ICD-10-CM | POA: Diagnosis not present

## 2017-08-10 DIAGNOSIS — S81811D Laceration without foreign body, right lower leg, subsequent encounter: Secondary | ICD-10-CM

## 2017-08-10 DIAGNOSIS — E1165 Type 2 diabetes mellitus with hyperglycemia: Secondary | ICD-10-CM | POA: Diagnosis not present

## 2017-08-10 DIAGNOSIS — Z794 Long term (current) use of insulin: Secondary | ICD-10-CM

## 2017-08-10 MED ORDER — PRAMIPEXOLE DIHYDROCHLORIDE 1 MG PO TABS: ORAL_TABLET | ORAL | 1 refills | Status: DC

## 2017-08-10 NOTE — Patient Instructions (Signed)
WE NOW OFFER   [facility] FAST TRACK!!!  SAME DAY Appointments for ACUTE CARE  Such as: Sprains, Injuries, cuts, abrasions, rashes, muscle pain, joint pain, back pain Colds, flu, sore throats, headache, allergies, cough, fever  Ear pain, sinus and eye infections Abdominal pain, nausea, vomiting, diarrhea, upset stomach Animal/insect bites  3 Easy Ways to Schedule: Walk-In Scheduling Call in scheduling Mychart Sign-up: [email or web address]         

## 2017-08-10 NOTE — Progress Notes (Signed)
   Subjective:    Patient ID: Melanie Mcdonald, female    DOB: 06-Sep-1933, 83 y.o.   MRN: [id number]  HPI Here to follow up an ER visit on 07-30-17 for a laceration to the right lower leg, which occurred as the result of being struck by a car door. This was closed with SteriStrips and covered with gauze. This was checked by Dr. Hardie Pulley, her podiatrist, 2 days ago and he said it was healing well. The wound was then dressed with gauze again. Dr. March Rummage has also been treating her for a heel ulcer. Prior to that she was in [facility] hospital from 07-19-17 to 07-24-17 for a stroke, which caused an expressive aphasia. An MRI revealed several areas of infarction in the left cerebellum, the left thalamus, and the left parietal subcortical white matter. She then spent some time in a rehab facility, and now she is back home. Her speech has returned to normal. Her chief esidual neurologic deficits at this time revolve around her chronic dizziness. She is scheduled to follow up Dr. Delice Lesch, her neurologist, on 10-24-17. Lastly while in the hospital her A1c was 10.9 indicating poor diabetic control. She last saw Dr. Buddy Duty in July.    Review of Systems  Constitutional: Negative.   Respiratory: Negative.   Cardiovascular: Negative.   Skin: Positive for wound.  Neurological: Positive for dizziness. Negative for seizures, syncope, speech difficulty, numbness and headaches.       Objective:   Physical Exam  Constitutional: She is oriented to person, place, and time. She appears well-developed and well-nourished.  Walks with a walker   Cardiovascular: Normal rate, regular rhythm, normal heart sounds and intact distal pulses.  Pulmonary/Chest: Effort normal and breath sounds normal. No respiratory distress. She has no wheezes. She has no rales.  Neurological: She is alert and oriented to person, place, and time. No cranial nerve deficit.  Her speech is normal   Skin:  The right lower leg has a bandage in place            Assessment & Plan:  She is recovering from a recent stroke, and this appears to have been one in a series of small strokes she has been experiencing. She will follow up with Dr. Delice Lesch as above. She has chronic dizziness which is multifactorial, and no doubt cerebellar ischemia may be contributing. She remains on Plavix. Her HTN control is borderline, but her diabetes is not well controlled. I asked her to make an appt with Dr. Buddy Duty for this ASAP, and she agreed to do so. She will follow up with Dr. March Rummage about her skin wounds.  Alysia Penna, MD

## 2017-08-13 ENCOUNTER — Telehealth: Payer: Self-pay | Admitting: Neurology

## 2017-08-13 NOTE — Telephone Encounter (Signed)
Patient was seen last by Dr. Delice Lesch in 06/2015. She has just recently been hospitalized for mini strokes. She is on a wait list in January. Should she been seen sooner to having High Blood Pressure issues as well. Please Advise. Thanks

## 2017-08-14 ENCOUNTER — Other Ambulatory Visit: Payer: Self-pay | Admitting: Dermatology

## 2017-08-15 ENCOUNTER — Inpatient Hospital Stay: Payer: Medicare Other | Admitting: Family Medicine

## 2017-08-17 ENCOUNTER — Ambulatory Visit: Payer: Medicare Other | Admitting: Podiatry

## 2017-08-17 DIAGNOSIS — S81811D Laceration without foreign body, right lower leg, subsequent encounter: Secondary | ICD-10-CM

## 2017-08-17 DIAGNOSIS — S81801D Unspecified open wound, right lower leg, subsequent encounter: Secondary | ICD-10-CM | POA: Diagnosis not present

## 2017-08-20 ENCOUNTER — Telehealth: Payer: Self-pay

## 2017-08-20 NOTE — Telephone Encounter (Signed)
Called and spoke to the pt she stated that she does need this form filled out. Pt did request to have this sent to [place].

## 2017-08-21 NOTE — Telephone Encounter (Signed)
Pt stated that she will need Dr. Sarajane Jews to fill this out because she

## 2017-08-21 NOTE — Telephone Encounter (Signed)
The signer of that form certifies that she is under that person's care for her diabetes. She is NOT under my care for that, she sees Dr. Buddy Duty

## 2017-08-21 NOTE — Telephone Encounter (Signed)
Pt advised and voiced understanding. Form faxed to Dr. Buddy Duty. Dr.Fry can NOT fill out. Faxed form over to Dr. Buddy Duty.

## 2017-08-22 ENCOUNTER — Ambulatory Visit: Payer: Medicare Other | Admitting: Podiatry

## 2017-08-22 ENCOUNTER — Ambulatory Visit (INDEPENDENT_AMBULATORY_CARE_PROVIDER_SITE_OTHER): Payer: Medicare Other | Admitting: *Deleted

## 2017-08-22 DIAGNOSIS — I6340 Cerebral infarction due to embolism of unspecified cerebral artery: Secondary | ICD-10-CM

## 2017-08-22 DIAGNOSIS — S81801D Unspecified open wound, right lower leg, subsequent encounter: Secondary | ICD-10-CM | POA: Diagnosis not present

## 2017-08-22 DIAGNOSIS — S81811D Laceration without foreign body, right lower leg, subsequent encounter: Secondary | ICD-10-CM

## 2017-08-22 NOTE — Progress Notes (Signed)
[facility] Summary Report / Loop Recorder 

## 2017-08-26 NOTE — Progress Notes (Signed)
  Subjective:  Patient ID: Melanie Mcdonald, female    DOB: 1933-05-15,  MRN: [id number]  83 y.o. female returns for right foot and leg wound f/u.  States that her legs have been sore and swollen.  Denies new issues  Objective:  There were no vitals filed for this visit.  General AA&O x3. Normal mood and affect.  Vascular Dorsalis pedis and posterior tibial pulses  present 1+ bilaterally  Capillary refill normal to all digits. Pedal hair growth normal.  Neurologic Epicritic sensation grossly present. Protective sensation absent  Dermatologic (Wound) Right leg laceration healing well scant drainage no erythema no signs of infection. Skin flap with dusky color Left heel and right hallux wounds remain healed  Orthopedic: MMT 5/5 in dorsiflexion, plantarflexion, inversion, and eversion. Normal lower extremity joint ROM without pain or crepitus.   Assessment & Plan:  Patient was evaluated and treated and all questions answered.  Right leg wound secondary to laceration -Appears to be improving.  Patient did not wish numbers today. -Antibiotic ointment and dry sterile dressing applied. Ace bandage applied from foot to just below the knee -Will order home health care for wound care.  To apply Silvadene DSD every other day  L Heel Wound -Remains Healed  R Hallux Wound -Remains Healed Return in about 1 week (around 08/29/2017) for Wound Care.

## 2017-08-27 ENCOUNTER — Telehealth: Payer: Self-pay | Admitting: *Deleted

## 2017-08-27 ENCOUNTER — Telehealth: Payer: Self-pay | Admitting: Podiatry

## 2017-08-27 DIAGNOSIS — S81811D Laceration without foreign body, right lower leg, subsequent encounter: Secondary | ICD-10-CM

## 2017-08-27 DIAGNOSIS — S81801D Unspecified open wound, right lower leg, subsequent encounter: Secondary | ICD-10-CM

## 2017-08-27 NOTE — Telephone Encounter (Signed)
-----   Message from Evelina Bucy, DPM sent at 08/26/2017 11:39 AM EST ----- Regarding: [facility] [place] Can we order [facility] for patient? Orders as in note

## 2017-08-27 NOTE — Telephone Encounter (Signed)
Faxed required form, clinicals and demographics to [facility]. 

## 2017-08-27 NOTE — Telephone Encounter (Signed)
Faxed home health care orders of 08/22/2017 to [facility].

## 2017-08-27 NOTE — Telephone Encounter (Signed)
This is Dispensing optician, Therapist, sports with [facility]. We received a fax today on this pt. We are going to have to decline the referral due to staffing. We will not be able to staff her. I wanted to call and let you know. If you have any questions, you can call me back at [phone].

## 2017-08-27 NOTE — Addendum Note (Signed)
Addended by: Harriett Sine D on: 08/27/2017 09:28 AM   Modules accepted: Orders

## 2017-08-29 ENCOUNTER — Telehealth: Payer: Self-pay | Admitting: Podiatry

## 2017-08-29 MED ORDER — SILVER SULFADIAZINE 1 % EX CREA: 1.0000 "application " | TOPICAL_CREAM | Freq: Every day | CUTANEOUS | 0 refills | Status: DC

## 2017-08-29 NOTE — Telephone Encounter (Signed)
I informed pt the rx had been called to [phone] and if she wanted to wait until after tomorrow's office visit with Dr. March Rummage to pick up if needed that would be understandable.

## 2017-08-29 NOTE — Telephone Encounter (Signed)
This is Connie,RN with [facility]. I am doing a start of care on her and I need verbal orders for the start of care. She is coming in tomorrow but she does not have silvadene cream which we are supposed to use per the orders so she will need mor of that unless you decide to change the treatment. Just in seeing it I was wondering if maybe she might need some santyl or maybe we could try hydrophilic lube to see if that would debride itself. You can give me a call back at [phone]. Thank you. Bye bye.

## 2017-08-29 NOTE — Addendum Note (Signed)
Addended by: Harriett Sine D on: 08/29/2017 05:22 PM   Modules accepted: Orders

## 2017-08-30 ENCOUNTER — Telehealth: Payer: Self-pay | Admitting: Family Medicine

## 2017-08-30 ENCOUNTER — Ambulatory Visit: Payer: Medicare Other | Admitting: Podiatry

## 2017-08-30 DIAGNOSIS — S81801D Unspecified open wound, right lower leg, subsequent encounter: Secondary | ICD-10-CM

## 2017-08-30 DIAGNOSIS — L97421 Non-pressure chronic ulcer of left heel and midfoot limited to breakdown of skin: Secondary | ICD-10-CM | POA: Diagnosis not present

## 2017-08-30 DIAGNOSIS — Z872 Personal history of diseases of the skin and subcutaneous tissue: Secondary | ICD-10-CM

## 2017-08-30 NOTE — Telephone Encounter (Signed)
Called Melanie Mcdonald and told her to reach out to the pt's diabatic doctor Dr. Buddy Duty who is with [facility] physicians

## 2017-08-30 NOTE — Progress Notes (Signed)
  Subjective:  Patient ID: Melanie Mcdonald, female    DOB: May 14, 1933,  MRN: [id number]  83 y.o. female returns for follow-up right upper leg wound.  States that she has home health coming now.  Denies new issues.  States that the left heel is starting to bother her and is concerned the ulcers opening again  Objective:  There were no vitals filed for this visit. General AA&O x3. Normal mood and affect.  Vascular Dorsalis pedis and posterior tibial pulses  present 1+ and absent right  Capillary refill normal to all digits. Pedal hair growth absent.  Neurologic Epicritic sensation grossly present. Protective sensation absent  Dermatologic (Wound) Wound Location: R upper leg Wound Measurement: 3 x 1.5 x 0.1 Wound Base: Mixed Granular/Fibrotic Peri-wound: Normal Exudate: Scant/small amount Serosanguinous exudate Wound progress: Improved since last check.  Left foot heel ulceration remains healed.  Significant xerotic tissue  Orthopedic: MMT 5/5 in dorsiflexion, plantarflexion, inversion, and eversion. Normal lower extremity joint ROM without pain or crepitus.   Radiographs: Taken and reviewed. No osseous erosions present. No acute fractures or dislocations.   Assessment & Plan:  Patient was evaluated and treated and all questions answered.  R Leg Wound 2/2 Laceration -Debridement as below. -Dressed with silvadene and DSD. HHC to do the same. -F/u in 2 weeks for wound care.  Procedure: Selective Debridement of Wound Rationale: Removal of devitalized tissue from the wound to promote healing.  Pre-Debridement Wound Measurements: 3 cm x 1.5 cm x 0.1 cm  Post-Debridement Wound Measurements: same as pre-debridement. Type of Debridement: Selective Tissue Removed: Devitalized soft-tissue Instrumentation: 3-0 mm dermal curette Dressing: Dry, sterile, compression dressing. Disposition: Patient tolerated procedure well. Patient to return in 2 weeks for follow-up.  History of left heel  ulcer -Educated on proper application of moisturizing cream to prevent skin cracking  Return in about 2 weeks (around 09/13/2017) for Wound Care.

## 2017-08-30 NOTE — Telephone Encounter (Signed)
Copied from [place] [phone]. Topic: General - Other >> Aug 30, 2017 11:19 AM Carolyn Stare wrote: Reason for CRM:   Marlowe Kays with [facility] would like a call back concerning pt insulin. They are trying to found out if she has long lasting in the nursing home She said pt not taking sometime cause she had low blood sugar.    [phone]

## 2017-08-31 ENCOUNTER — Encounter: Payer: Self-pay | Admitting: Family Medicine

## 2017-08-31 ENCOUNTER — Ambulatory Visit: Payer: Medicare Other | Admitting: Family Medicine

## 2017-08-31 ENCOUNTER — Telehealth: Payer: Self-pay | Admitting: Family Medicine

## 2017-08-31 VITALS — BP 118/58 | HR 85 | Temp 98.3°F | Wt 234.0 lb

## 2017-08-31 DIAGNOSIS — J069 Acute upper respiratory infection, unspecified: Secondary | ICD-10-CM

## 2017-08-31 DIAGNOSIS — G4730 Sleep apnea, unspecified: Secondary | ICD-10-CM

## 2017-08-31 DIAGNOSIS — I5033 Acute on chronic diastolic (congestive) heart failure: Secondary | ICD-10-CM | POA: Diagnosis not present

## 2017-08-31 DIAGNOSIS — I10 Essential (primary) hypertension: Secondary | ICD-10-CM

## 2017-08-31 MED ORDER — INSULIN REGULAR HUMAN (CONC) 500 UNIT/ML ~~LOC~~ SOPN: 30.0000 [IU] | PEN_INJECTOR | Freq: Two times a day (BID) | SUBCUTANEOUS | Status: DC

## 2017-08-31 MED ORDER — PRAMIPEXOLE DIHYDROCHLORIDE 1 MG PO TABS
ORAL_TABLET | ORAL | 1 refills | Status: DC
Start: 2017-08-31 — End: 2017-10-29

## 2017-08-31 MED ORDER — HYDROCODONE-HOMATROPINE 5-1.5 MG/5ML PO SYRP: 5.0000 mL | ORAL_SOLUTION | ORAL | 0 refills | Status: DC | PRN

## 2017-08-31 MED ORDER — GABAPENTIN 300 MG PO CAPS: 300.0000 mg | ORAL_CAPSULE | Freq: Two times a day (BID) | ORAL | 11 refills | Status: DC

## 2017-08-31 MED ORDER — FUROSEMIDE 40 MG PO TABS: 40.0000 mg | ORAL_TABLET | Freq: Two times a day (BID) | ORAL | 11 refills | Status: DC

## 2017-08-31 NOTE — Telephone Encounter (Signed)
Ok thanks for the Melanie Mcdonald spoke about this with the pt at her OV today with Dr. Sarajane Jews she will need to call and reschedule.

## 2017-08-31 NOTE — Telephone Encounter (Signed)
Copied from [place] [id number]. Topic: General - Other >> Aug 31, 2017 11:36 AM Lennox Solders wrote: Reason for CRM: tammy from [facility] is calling patient would like to move her speech evaluation to next week

## 2017-08-31 NOTE — Progress Notes (Signed)
   Subjective:    Patient ID: Melanie Mcdonald, female    DOB: October 27, 1932, 83 y.o.   MRN: [id number]  HPI Here for 4 days of PND, hoarseness and a dry cough. No fever.    Review of Systems  HENT: Positive for congestion, postnasal drip and voice change. Negative for sinus pressure, sinus pain and sore throat.   Eyes: Negative.   Respiratory: Positive for cough.   Cardiovascular: Negative.        Objective:   Physical Exam  Constitutional: She appears well-developed and well-nourished.  HENT:  Right Ear: External ear normal.  Left Ear: External ear normal.  Nose: Nose normal.  Mouth/Throat: Oropharynx is clear and moist.  Hoarse voice   Eyes: Conjunctivae are normal.  Neck: No thyromegaly present.  Cardiovascular: Normal rate, regular rhythm, normal heart sounds and intact distal pulses.  Pulmonary/Chest: Effort normal and breath sounds normal. No respiratory distress. She has no wheezes. She has no rales.  Lymphadenopathy:    She has no cervical adenopathy.          Assessment & Plan:  Viral URI. Drink fluids, use Mucinex prn. Use Hydromet for cough. Alysia Penna, MD

## 2017-09-03 ENCOUNTER — Telehealth: Payer: Self-pay | Admitting: Podiatry

## 2017-09-03 NOTE — Telephone Encounter (Signed)
Pt called stating that Home health didn't come out today and she called them and they told her that she was not covered thru [place]. Her husband had [employer] insurance but she didn't qualify for home health with the [place]. Wants to know what else she is suppose to do. Wants to talk to Dr. March Rummage and see what other places will cover with her insurance ?

## 2017-09-04 ENCOUNTER — Other Ambulatory Visit: Payer: Self-pay | Admitting: Internal Medicine

## 2017-09-04 ENCOUNTER — Ambulatory Visit
Admission: RE | Admit: 2017-09-04 | Discharge: 2017-09-04 | Disposition: A | Discharge status: Home / Self Care | Payer: Medicare Other | Source: Ambulatory Visit | Attending: Internal Medicine | Admitting: Internal Medicine

## 2017-09-04 DIAGNOSIS — S199XXA Unspecified injury of neck, initial encounter: Secondary | ICD-10-CM

## 2017-09-05 ENCOUNTER — Telehealth: Payer: Self-pay | Admitting: Podiatry

## 2017-09-05 LAB — CUP PACEART REMOTE DEVICE CHECK
Implantable Pulse Generator Implant Date: 20181022
MDC IDC SESS DTM: 20181121203926

## 2017-09-05 NOTE — Telephone Encounter (Signed)
Good morning. My name is June Smith and I'm an occupation therapist with [facility]. I saw the pt and am calling to get orders to see the pt once a week for six weeks to address her weakness, her need for ADL independence, balance, and positioning. You can reach me at [phone]. Thank you.

## 2017-09-05 NOTE — Telephone Encounter (Signed)
Returned [facility] OT phone call from [phone], LVM to call back

## 2017-09-06 ENCOUNTER — Telehealth: Payer: Self-pay | Admitting: Podiatry

## 2017-09-06 NOTE — Telephone Encounter (Signed)
This is Flonnie Hailstone, OT with [facility]. I'm returning the call to Janett Billow in regards to the pt. I can be reached at [phone]. Thank you.

## 2017-09-12 NOTE — Telephone Encounter (Signed)
I spoke with pt and she states [facility] is there now, I asked if she still had the [employer] and she said yes, I told her that was the insurance we sent with her [place] referral. Pt states someone else sent the [employer] questions. Pt states she is going to see Dr. Delrae Rend 09/14/2017 and she knows he will sign for her diabetic shoes. I told her I would message the Diabetic Shoe/Orthotic agent - D. Miner a message to make certain he had the paper work available for review.

## 2017-09-14 ENCOUNTER — Encounter: Payer: Self-pay | Admitting: Podiatry

## 2017-09-14 ENCOUNTER — Telehealth: Payer: Self-pay | Admitting: Family Medicine

## 2017-09-14 ENCOUNTER — Ambulatory Visit: Payer: Medicare Other | Admitting: Podiatry

## 2017-09-14 DIAGNOSIS — S81801D Unspecified open wound, right lower leg, subsequent encounter: Secondary | ICD-10-CM | POA: Diagnosis not present

## 2017-09-14 DIAGNOSIS — L97429 Non-pressure chronic ulcer of left heel and midfoot with unspecified severity: Secondary | ICD-10-CM | POA: Diagnosis not present

## 2017-09-14 NOTE — Telephone Encounter (Signed)
Copied from [place]. Topic: General - Other >> Sep 14, 2017  2:54 PM Carolyn Stare wrote:  [facility] with  [facility] call to ask for pt bp and a1c results. Can call her back at [phone]   ext [phone]

## 2017-09-14 NOTE — Telephone Encounter (Signed)
Called [name] and left her a VM with pt's name and DOB told her pt's last BP was 118/58 and a1c last tested was 10.9

## 2017-09-17 NOTE — Progress Notes (Signed)
  Subjective:  Patient ID: Melanie Mcdonald, female    DOB: [birth year],  MRN: [id number]  Chief Complaint  Patient presents with  . Foot Ulcer    Follow up ulcer right leg   "Its looking better to me"   83 y.o. female returns for wound care. Believes the wound to be improving. Denies N/V/F/Ch.  Also complaining of L heel pain. Worried that the wound is opening again. Objective:  There were no vitals filed for this visit. General AA&O x3. Normal mood and affect.  Vascular Foot warm to touch.  Neurologic Sensation grossly diminished.  Dermatologic (Wound) Wound Location: R upper leg Wound Measurement: 3 x 1.0 x 0.1 Wound Base: Fibrotic slough Peri-wound: Normal Exudate: None: wound tissue dry  Wound progress: Improved since last check.  L Heel without any open ulceration.  Orthopedic: No pain to palpation either foot.   Assessment & Plan:  Patient was evaluated and treated and all questions answered.  Ulcer R Leg -Debridement as below. -Dressed with iodosorb, DSD. -HHC to apply Santyl WTD qOD for enzymatic debridement.  Procedure: Selective Debridement of Wound Rationale: Removal of devitalized tissue from the wound to promote healing.  Pre-Debridement Wound Measurements: 3 cm x 1 cm x 0.1 cm  Post-Debridement Wound Measurements: same as pre-debridement. Type of Debridement: Selective Tissue Removed: Devitalized soft-tissue Instrumentation: 3-0 mm dermal curette Dressing: Dry, sterile, compression dressing. Disposition: Patient tolerated procedure well. Patient to return in 2 week for follow-up.   L Heel Wound -Remains healed. Will continue to monitor.

## 2017-09-17 NOTE — Telephone Encounter (Signed)
Caller name: [name]  Relation to pt: [employer]  Call back number: [phone]  Ext [phone]    Reason for call:  Returned call inquiring about the date a1c was drawn and the date when BP was taken

## 2017-09-17 NOTE — Telephone Encounter (Signed)
Called misty and left a VM BP was 118/58 on 08/31/2017 and her A1c was last tested 07/20/2017 at 10.9 for A1c.

## 2017-09-20 ENCOUNTER — Ambulatory Visit: Payer: Self-pay

## 2017-09-20 NOTE — Telephone Encounter (Signed)
Please advise in PCP absence, or can send to PCP tomorrow if you think okay to wait until then.

## 2017-09-20 NOTE — Telephone Encounter (Signed)
Pt has stopped taking her Lasix for the past 2-3 days. Pt is having moderate edema today. Tammy (home health nurse) states weight and been +/- 1-2 lbs since 09/12/17.  States pt is having weakness and called pharmacist to see if Lasix is causing weakness. Per pt the pharmacist stated that a low potassium could cause fatigue. Home health nurse wants to know if she should advise pt to resume her Lasix. She can be reached at [phone]. Answer Assessment - Initial Assessment Questions 1. ONSET: "When did the swelling start?" (e.g., minutes, hours, days)     Leg edema is a chronic problem 2. LOCATION: "What part of the leg is swollen?"  "Are both legs swollen or just one leg?"     Both legs swollen from the knee down. Pt has a redness above right outer ankle 3. SEVERITY: "How bad is the swelling?" (e.g., localized; mild, moderate, severe)  - Localized - small area of swelling localized to one leg  - MILD pedal edema - swelling limited to foot and ankle, pitting edema < 1/4 inch (6 mm) deep, rest and elevation eliminate most or all swelling  - MODERATE edema - swelling of lower leg to knee, pitting edema > 1/4 inch (6 mm) deep, rest and elevation only partially reduce swelling  - SEVERE edema - swelling extends above knee, facial or hand swelling present      moderate 4. REDNESS: "Does the swelling look red or infected?"     No except for the wound to the right outer ankle 5. PAIN: "Is the swelling painful to touch?" If so, ask: "How painful is it?"   (Scale 1-10; mild, moderate or severe)     no 6. FEVER: "Do you have a fever?" If so, ask: "What is it, how was it measured, and when did it start?"      no 7. CAUSE: "What do you think is causing the leg swelling?"     Chronic problem not taking lasix past 2-3 days 8. MEDICAL HISTORY: "Do you have a history of heart failure, kidney disease, liver failure, or cancer?"     Diabetes, CHF, heart failure 9. RECURRENT SYMPTOM: "Have you had leg swelling  before?" If so, ask: "When was the last time?" "What happened that time?"     Yes chronic issue 10. OTHER SYMPTOMS: "Do you have any other symptoms?" (e.g., chest pain, difficulty breathing)       no 11. PREGNANCY: "Is there any chance you are pregnant?" "When was your last menstrual period?"       n/a  Protocols used: LEG SWELLING AND EDEMA-A-AH

## 2017-09-20 NOTE — Telephone Encounter (Signed)
I think she can wait for Dr. Sarajane Jews tomorrow

## 2017-09-21 ENCOUNTER — Ambulatory Visit (INDEPENDENT_AMBULATORY_CARE_PROVIDER_SITE_OTHER): Payer: Medicare Other | Admitting: *Deleted

## 2017-09-21 DIAGNOSIS — I6340 Cerebral infarction due to embolism of unspecified cerebral artery: Secondary | ICD-10-CM

## 2017-09-21 NOTE — Telephone Encounter (Signed)
Called pt and left a VM to call back. CRM created.  

## 2017-09-21 NOTE — Telephone Encounter (Signed)
Spoke with pt and advised her to continue Lasix as directed. She will do this. She mentions that she is having issues with muscle cramps and weakness. Upon further questioning she admits that she has not been drinking much liquid each day. Advised pt to increase her fluid intake to no more than 2 liters daily and see if this helps her symptoms. She will try this and call back if still having issues with fluid retention or muscle cramps. Nothing further needed at this time.

## 2017-09-21 NOTE — Telephone Encounter (Signed)
Advise her that she needs to take the Lasix every single day. Her potassium was normal 2 months ago.

## 2017-09-24 DIAGNOSIS — M6281 Muscle weakness (generalized): Secondary | ICD-10-CM | POA: Diagnosis not present

## 2017-09-26 ENCOUNTER — Telehealth: Payer: Self-pay | Admitting: Family Medicine

## 2017-09-26 NOTE — Telephone Encounter (Signed)
Copied from [place] [phone]. Topic: General - Other >> Sep 26, 2017 10:58 AM Lennox Solders wrote: Reason for CRM: [facility]. Dominica Severin is calling to report pt had a fall on 09-22-17 and no injuries.

## 2017-09-26 NOTE — Progress Notes (Signed)
[facility] Summary Report / Loop Recorder 

## 2017-09-27 ENCOUNTER — Other Ambulatory Visit: Payer: Self-pay | Admitting: Dermatology

## 2017-09-28 ENCOUNTER — Telehealth: Payer: Self-pay | Admitting: Family Medicine

## 2017-09-28 ENCOUNTER — Ambulatory Visit: Payer: Medicare Other | Admitting: Podiatry

## 2017-09-28 NOTE — Telephone Encounter (Signed)
Copied from [place] [phone]. Topic: General - Other >> Sep 28, 2017 11:20 AM Lennox Solders wrote: Reason for CRM: tammy speech therapy [facility] is calling the patient is no taking her medications as prescribed. Pt is only taking losartan 25 mg instead of 50 mg. Pt is not eating nor checking her blood sugar. Pt is not taking her lasix. Pt is having a lot of edema. The right ankle is worst than left.

## 2017-09-28 NOTE — Telephone Encounter (Signed)
Sent to Dr. Sarajane Jews

## 2017-09-28 NOTE — Telephone Encounter (Signed)
Copied from [place] [id number]. Topic: General - Other >> Sep 28, 2017 12:27 PM Bea Graff, NT wrote: Reason for CRM: Flonnie Hailstone from [facility] calling to get OT orders changed for this pt. Pt is not safe, not taking medications accurately. Verbal orders needed for 1 times per week for 1 week and then 2 times per week for 2 weeks. CB#: [phone]

## 2017-10-01 ENCOUNTER — Telehealth: Payer: Self-pay | Admitting: Family Medicine

## 2017-10-01 NOTE — Telephone Encounter (Signed)
Please tell the patient to take her medications as prescribed

## 2017-10-01 NOTE — Telephone Encounter (Signed)
[name] the nurse with [facility] who manages her diabetes called in wanting to make Dr. Sarajane Jews aware the pt fell last week.    At the time she denied having any injuries.   Now she is c/o soreness and has a bruise on her chest from where she fell into her walker.   [name] feels the pt needs to be evaluated.   She is going to have the pt's son, who lives with her, call in and schedule an appt so they can schedule an appt around his work schedule. I routed a note to Dr. Barbie Banner nurse pool making them aware of the situation.

## 2017-10-01 NOTE — Telephone Encounter (Signed)
Called Melanie Mcdonald and left a VM with pt's name and DOB advised her to inform pt to take medications as directed. Call back if needed.

## 2017-10-01 NOTE — Telephone Encounter (Signed)
Please call these orders in

## 2017-10-01 NOTE — Telephone Encounter (Signed)
Sent to PCP as an FYI  

## 2017-10-01 NOTE — Telephone Encounter (Signed)
Called Flonnie Hailstone and left a VM for the OK for verbal orders with pt's name and DOB.

## 2017-10-03 ENCOUNTER — Encounter: Payer: Self-pay | Admitting: Podiatry

## 2017-10-03 ENCOUNTER — Ambulatory Visit: Payer: Medicare Other | Admitting: Podiatry

## 2017-10-03 VITALS — BP 138/77 | HR 90 | Temp 98.7°F | Resp 18

## 2017-10-03 DIAGNOSIS — S81801D Unspecified open wound, right lower leg, subsequent encounter: Secondary | ICD-10-CM

## 2017-10-03 DIAGNOSIS — L97421 Non-pressure chronic ulcer of left heel and midfoot limited to breakdown of skin: Secondary | ICD-10-CM

## 2017-10-03 DIAGNOSIS — L97519 Non-pressure chronic ulcer of other part of right foot with unspecified severity: Secondary | ICD-10-CM

## 2017-10-03 DIAGNOSIS — E11621 Type 2 diabetes mellitus with foot ulcer: Secondary | ICD-10-CM

## 2017-10-03 NOTE — Progress Notes (Signed)
  Subjective:  Patient ID: Melanie Mcdonald, female    DOB: 09-16-1933,  MRN: [id number]  Chief Complaint  Patient presents with  . Foot Ulcer    the right leg feels ok   . Foot Problem    the left heel is still hurting some and i have some pain under the toes   84 y.o. female returns for wound care. Believes the wound to be improving.  Endorses pain on her left heel and pain in her right great toe. Denies N/V/F/Ch.  Objective:   Vitals:   10/03/17 1410  BP: 138/77  Pulse: 90  Resp: 18  Temp: 98.7 F (37.1 C)   General AA&O x3. Normal mood and affect.  Vascular Foot warm to touch.  Neurologic Sensation grossly diminished.  Dermatologic (Wound) Right leg wound approximately 2.5 x 0.5 with fibrotic wound base.  Wound tissue dry no signs of erythema.  No signs of infection  Right hallux with ulceration we reviewed pinpoint with surrounding hyperkeratotic tissue.  No erythema no drainage no signs of acute infection  Hyperkeratosis left heel  Orthopedic:  Pain palpation hyperkeratotic area of left heel   Assessment & Plan:  Patient was evaluated and treated and all questions answered.  Ulcer right leg, right great toe -Debridement as below. -Dressed with Silvadene, DSD. -New home health care  Procedure: Selective Debridement of Wound Rationale: Removal of devitalized tissue from the wound to promote healing.  Pre-Debridement Wound Measurements: 2.5 cm x 0.5 cm x 0.1 cm (right leg), 0.1 x 0.1 x 0.1 (R great toe) Post-Debridement Wound Measurements: same as pre-debridement. Type of Debridement: Selective Tissue Removed: Devitalized soft-tissue Instrumentation: 15 blade  dressing: Dry, sterile, compression dressing. Disposition: Patient tolerated procedure well. Patient to return in 1 week for follow-up.  Return in about 2 weeks (around 10/17/2017) for Wound Care.

## 2017-10-04 LAB — CUP PACEART REMOTE DEVICE CHECK
Date Time Interrogation Session: 20181221210755
Implantable Pulse Generator Implant Date: 20181022

## 2017-10-05 DIAGNOSIS — M6281 Muscle weakness (generalized): Secondary | ICD-10-CM | POA: Diagnosis not present

## 2017-10-08 ENCOUNTER — Telehealth: Payer: Self-pay | Admitting: Family Medicine

## 2017-10-08 NOTE — Telephone Encounter (Signed)
Copied from [place] [phone]. Topic: Quick Communication - See Telephone Encounter >> Oct 08, 2017  1:36 PM Corie Chiquito, [place] wrote: CRM for notification. See Telephone encounter for: [name] called because she needs verbal orders to continue PT  2 times a week for 3 weeks. If someone could give her a call at [phone]  10/08/17.

## 2017-10-08 NOTE — Telephone Encounter (Signed)
Yes I am still her PCP. Dr. Buddy Duty is just for diabetes care

## 2017-10-08 NOTE — Telephone Encounter (Signed)
Dr. Sarajane Jews you did take over this pt correct?

## 2017-10-08 NOTE — Telephone Encounter (Signed)
Can we verify Dr. Sarajane Jews is still PCP and then we need to change in computer, has another listed right now.

## 2017-10-09 NOTE — Telephone Encounter (Signed)
I am sorry Hoyle Sauer can you show me how to do this again? Thanks

## 2017-10-10 ENCOUNTER — Telehealth: Payer: Self-pay | Admitting: Family Medicine

## 2017-10-10 NOTE — Telephone Encounter (Signed)
Sent to PCP ?

## 2017-10-10 NOTE — Telephone Encounter (Signed)
Copied from [place] [id number]. Topic: Quick Communication - See Telephone Encounter >> Oct 08, 2017  1:36 PM Corie Chiquito, [place] wrote: CRM for notification. See Telephone encounter for: Melanie Mcdonald called because she needs verbal orders to continue PT  2 times a week for 3 weeks. If someone could give her a call at [phone]  10/08/17. >> Oct 10, 2017 10:18 AM Synthia Innocent wrote: Melanie Mcdonald calling again, requesting call back, [phone]

## 2017-10-10 NOTE — Telephone Encounter (Signed)
Spoke with [name] the [place] for the verbal orders requested.

## 2017-10-10 NOTE — Telephone Encounter (Signed)
Okay to continue PT.

## 2017-10-11 NOTE — Telephone Encounter (Signed)
Done

## 2017-10-12 ENCOUNTER — Encounter: Payer: Self-pay | Admitting: Podiatry

## 2017-10-12 ENCOUNTER — Telehealth: Payer: Self-pay | Admitting: Podiatry

## 2017-10-12 ENCOUNTER — Ambulatory Visit (INDEPENDENT_AMBULATORY_CARE_PROVIDER_SITE_OTHER): Payer: Medicare Other | Admitting: Podiatry

## 2017-10-12 ENCOUNTER — Telehealth: Payer: Self-pay | Admitting: *Deleted

## 2017-10-12 DIAGNOSIS — E1142 Type 2 diabetes mellitus with diabetic polyneuropathy: Secondary | ICD-10-CM

## 2017-10-12 DIAGNOSIS — I872 Venous insufficiency (chronic) (peripheral): Secondary | ICD-10-CM

## 2017-10-12 DIAGNOSIS — S81801D Unspecified open wound, right lower leg, subsequent encounter: Secondary | ICD-10-CM

## 2017-10-12 DIAGNOSIS — M2042 Other hammer toe(s) (acquired), left foot: Secondary | ICD-10-CM | POA: Diagnosis not present

## 2017-10-12 DIAGNOSIS — M21611 Bunion of right foot: Secondary | ICD-10-CM

## 2017-10-12 DIAGNOSIS — R2681 Unsteadiness on feet: Secondary | ICD-10-CM

## 2017-10-12 DIAGNOSIS — I739 Peripheral vascular disease, unspecified: Secondary | ICD-10-CM

## 2017-10-12 DIAGNOSIS — R600 Localized edema: Secondary | ICD-10-CM | POA: Diagnosis not present

## 2017-10-12 DIAGNOSIS — E11621 Type 2 diabetes mellitus with foot ulcer: Secondary | ICD-10-CM

## 2017-10-12 DIAGNOSIS — M2041 Other hammer toe(s) (acquired), right foot: Secondary | ICD-10-CM | POA: Diagnosis not present

## 2017-10-12 DIAGNOSIS — L84 Corns and callosities: Secondary | ICD-10-CM | POA: Diagnosis not present

## 2017-10-12 DIAGNOSIS — M21612 Bunion of left foot: Secondary | ICD-10-CM

## 2017-10-12 DIAGNOSIS — L97519 Non-pressure chronic ulcer of other part of right foot with unspecified severity: Secondary | ICD-10-CM

## 2017-10-12 NOTE — Telephone Encounter (Signed)
Pt wanted you to know that her Home Health is with [facility]

## 2017-10-12 NOTE — Telephone Encounter (Signed)
Faxed cancellation of referral to [facility]. Faxed Dr. Eleanora Neighbor 10/12/2017 2:05pm orders to [facility].

## 2017-10-12 NOTE — Telephone Encounter (Signed)
I spoke with Melanie Mcdonald and he states they will be able to being pt's care Monday 10/15/2016. Faxed orders to [facility]. Faxed orders to [place] for walker with a seat.

## 2017-10-12 NOTE — Telephone Encounter (Signed)
Faxed cancellation of referral to [facility]. Faxed orders of Dr. March Rummage 10/12/2017 to [facility].

## 2017-10-12 NOTE — Telephone Encounter (Signed)
I spoke with Melanie Mcdonald - [facility] transferred to [place], and Melanie Mcdonald states the soonest they would be able to get to pt would be next Friday.

## 2017-10-12 NOTE — Telephone Encounter (Signed)
Dr. March Rummage ordered multilevel compression wraps to both legs and santyl or medihoney to right leg wound. Left message requesting call with the [facility] she is established.

## 2017-10-12 NOTE — Progress Notes (Signed)
  Subjective:  Patient ID: Melanie Mcdonald, female    DOB: Nov 23, 1932,  MRN: [id number]  Chief Complaint  Patient presents with  . Diabetic Ulcer    1 week follow up ulcer of right great toe   84 y.o. female returns for wound care. Believes the wound to be about the same.  Complains of continued swelling to both legs.  Has home health care coming for her wounds.  Also here to pick up diabetic shoes.  Denies N/V/F/Ch.  Objective:  There were no vitals filed for this visit. General AA&O x3. Normal mood and affect.  Vascular Foot warm to touch.  Neurologic Sensation grossly diminished.  Dermatologic (Wound) Wound Location: Rt. Hallux IPJ Wound Measurement: pre-ulcerative Wound Base: epithelialized, hyperkeratosis  Wound Location: Rt. Leg Wound Measurement: 2.5x2.0x0.2 Wound Base: Mixed Granular/Fibrotic Peri-wound: Normal Exudate: Scant/small amount serous exudate  Orthopedic: No pain to palpation either foot.   Assessment & Plan:  Patient was evaluated and treated and all questions answered.  Wound R Leg -Debridement as below. -Dressed with medihoney, DSD.  Procedure: Selective Debridement of Wound (R :Leg) Rationale: Removal of devitalized tissue from the wound to promote healing.  Pre-Debridement Wound Measurements: 2.5 cm x 0.5 cm x 0.1 cm  Post-Debridement Wound Measurements: same as pre-debridement. Type of Debridement: Selective Tissue Removed: Devitalized soft-tissue Instrumentation: 3-0 mm dermal curette Dressing: Dry, sterile, compression dressing. Disposition: Patient tolerated procedure well. Patient to return in 1 week for follow-up.  R Hallux Pre-ulcerative callus -Debrided with a 312 blade without incident.  Venous Insufficiency, Edema -Recommend multilayer compression dressings to be applied by [place].  DM with DPN, Hammertoes, HAV deformity, Pre-ulcerative callus -Diabetic shoes and inserts fitted and dispensed. Patient educated on proper wear.  Return in  about 2 weeks (around 10/26/2017) for Wound Care.

## 2017-10-16 ENCOUNTER — Telehealth: Payer: Self-pay | Admitting: Family Medicine

## 2017-10-16 NOTE — Telephone Encounter (Signed)
Pt said no more than usual for swelling in legs and short of breath, was concerned about the rapid weight gain. Please advise?

## 2017-10-16 NOTE — Telephone Encounter (Signed)
Can this wait until tomorrow for Dr. Sarajane Jews to review?

## 2017-10-16 NOTE — Telephone Encounter (Signed)
I spoke with pt, she is a little short of breath and swelling in both lower legs. She agreed to go to ER if symptoms worsen. I explained that Dr. Sarajane Jews is out of the office today and I would forward the note for review.

## 2017-10-16 NOTE — Telephone Encounter (Signed)
Is patient having symptoms of HF exacerbation? (SOB, cough, edema, congestion, etc.) If vitals stable and pt asymptomatic, recommend patient recheck weight tomorrow using same scale at same time of day in same clothes and report weight back to [place].

## 2017-10-16 NOTE — Telephone Encounter (Signed)
Copied from [facility]. Topic: General - Other >> Oct 16, 2017  1:25 PM Patrice Paradise wrote: Reason for CRM: Besty for [facility] called to let Dr. Sarajane Jews know that the patient weight is up on 10/11/17 is was 225 and on 10/16/17 it's at 230. All other virals are fine BP 136/72 heart rate 74.

## 2017-10-17 NOTE — Telephone Encounter (Signed)
She is probably not taking her Lasix correctly. Tell her to be sure to take this twice a day. Check back in a day or two.

## 2017-10-17 NOTE — Telephone Encounter (Signed)
Called and spoke with Melanie Mcdonald she stated that she has just been taking the medication once daily due to the medication will make her use the bathroom too often and it will interfere with her day. Pt plans to take the medication twice daily the next few days and will give [place] a call to let use know how her leg swelling is doing.

## 2017-10-18 ENCOUNTER — Emergency Department (HOSPITAL_COMMUNITY): Admit: 2017-10-18 | Discharge: 2017-10-18 | Disposition: A | Discharge status: Home / Self Care | Payer: Medicare Other

## 2017-10-18 ENCOUNTER — Encounter (HOSPITAL_COMMUNITY): Payer: Self-pay

## 2017-10-18 ENCOUNTER — Inpatient Hospital Stay (HOSPITAL_COMMUNITY)
Admission: EM | Admit: 2017-10-18 | Discharge: 2017-10-21 | DRG: 293 | Disposition: A | Discharge status: Home Health | Payer: Medicare Other | Attending: Internal Medicine | Admitting: Internal Medicine

## 2017-10-18 ENCOUNTER — Other Ambulatory Visit: Payer: Self-pay

## 2017-10-18 ENCOUNTER — Emergency Department (HOSPITAL_COMMUNITY): Payer: Medicare Other

## 2017-10-18 DIAGNOSIS — F339 Major depressive disorder, recurrent, unspecified: Secondary | ICD-10-CM | POA: Diagnosis present

## 2017-10-18 DIAGNOSIS — Z882 Allergy status to sulfonamides status: Secondary | ICD-10-CM | POA: Diagnosis not present

## 2017-10-18 DIAGNOSIS — Z8673 Personal history of transient ischemic attack (TIA), and cerebral infarction without residual deficits: Secondary | ICD-10-CM | POA: Diagnosis not present

## 2017-10-18 DIAGNOSIS — R600 Localized edema: Secondary | ICD-10-CM

## 2017-10-18 DIAGNOSIS — E1151 Type 2 diabetes mellitus with diabetic peripheral angiopathy without gangrene: Secondary | ICD-10-CM | POA: Diagnosis present

## 2017-10-18 DIAGNOSIS — Z794 Long term (current) use of insulin: Secondary | ICD-10-CM | POA: Diagnosis not present

## 2017-10-18 DIAGNOSIS — I10 Essential (primary) hypertension: Secondary | ICD-10-CM | POA: Diagnosis not present

## 2017-10-18 DIAGNOSIS — Z66 Do not resuscitate: Secondary | ICD-10-CM | POA: Diagnosis present

## 2017-10-18 DIAGNOSIS — Z95818 Presence of other cardiac implants and grafts: Secondary | ICD-10-CM

## 2017-10-18 DIAGNOSIS — S81811A Laceration without foreign body, right lower leg, initial encounter: Secondary | ICD-10-CM | POA: Diagnosis present

## 2017-10-18 DIAGNOSIS — Z880 Allergy status to penicillin: Secondary | ICD-10-CM

## 2017-10-18 DIAGNOSIS — I2510 Atherosclerotic heart disease of native coronary artery without angina pectoris: Secondary | ICD-10-CM | POA: Diagnosis present

## 2017-10-18 DIAGNOSIS — E785 Hyperlipidemia, unspecified: Secondary | ICD-10-CM | POA: Diagnosis present

## 2017-10-18 DIAGNOSIS — Z886 Allergy status to analgesic agent status: Secondary | ICD-10-CM | POA: Diagnosis not present

## 2017-10-18 DIAGNOSIS — Z9114 Patient's other noncompliance with medication regimen: Secondary | ICD-10-CM | POA: Diagnosis not present

## 2017-10-18 DIAGNOSIS — M7989 Other specified soft tissue disorders: Secondary | ICD-10-CM | POA: Diagnosis not present

## 2017-10-18 DIAGNOSIS — Z888 Allergy status to other drugs, medicaments and biological substances status: Secondary | ICD-10-CM | POA: Diagnosis not present

## 2017-10-18 DIAGNOSIS — Z881 Allergy status to other antibiotic agents status: Secondary | ICD-10-CM | POA: Diagnosis not present

## 2017-10-18 DIAGNOSIS — E669 Obesity, unspecified: Secondary | ICD-10-CM | POA: Diagnosis present

## 2017-10-18 DIAGNOSIS — J449 Chronic obstructive pulmonary disease, unspecified: Secondary | ICD-10-CM | POA: Diagnosis present

## 2017-10-18 DIAGNOSIS — I110 Hypertensive heart disease with heart failure: Secondary | ICD-10-CM | POA: Diagnosis not present

## 2017-10-18 DIAGNOSIS — I5033 Acute on chronic diastolic (congestive) heart failure: Secondary | ICD-10-CM | POA: Diagnosis present

## 2017-10-18 DIAGNOSIS — F3289 Other specified depressive episodes: Secondary | ICD-10-CM | POA: Diagnosis present

## 2017-10-18 DIAGNOSIS — Z6833 Body mass index (BMI) 33.0-33.9, adult: Secondary | ICD-10-CM | POA: Diagnosis not present

## 2017-10-18 DIAGNOSIS — T501X6A Underdosing of loop [high-ceiling] diuretics, initial encounter: Secondary | ICD-10-CM | POA: Diagnosis present

## 2017-10-18 DIAGNOSIS — R531 Weakness: Secondary | ICD-10-CM

## 2017-10-18 DIAGNOSIS — E1165 Type 2 diabetes mellitus with hyperglycemia: Secondary | ICD-10-CM

## 2017-10-18 DIAGNOSIS — R0602 Shortness of breath: Secondary | ICD-10-CM

## 2017-10-18 DIAGNOSIS — Z836 Family history of other diseases of the respiratory system: Secondary | ICD-10-CM | POA: Diagnosis not present

## 2017-10-18 DIAGNOSIS — G4730 Sleep apnea, unspecified: Secondary | ICD-10-CM

## 2017-10-18 DIAGNOSIS — I509 Heart failure, unspecified: Secondary | ICD-10-CM

## 2017-10-18 HISTORY — DX: Heart failure, unspecified: I50.9

## 2017-10-18 HISTORY — DX: Malignant (primary) neoplasm, unspecified: C80.1

## 2017-10-18 HISTORY — DX: Cerebral infarction, unspecified: I63.9

## 2017-10-18 LAB — CBG MONITORING, ED
GLUCOSE-CAPILLARY: 72 mg/dL (ref 65–99)
Glucose-Capillary: 164 mg/dL — ABNORMAL HIGH (ref 65–99)

## 2017-10-18 LAB — HEPATIC FUNCTION PANEL
ALBUMIN: 3.5 g/dL (ref 3.5–5.0)
ALK PHOS: 91 U/L (ref 38–126)
ALT: 13 U/L — AB (ref 14–54)
AST: 16 U/L (ref 15–41)
BILIRUBIN TOTAL: 0.8 mg/dL (ref 0.3–1.2)
Bilirubin, Direct: 0.1 mg/dL (ref 0.1–0.5)
Indirect Bilirubin: 0.7 mg/dL (ref 0.3–0.9)
TOTAL PROTEIN: 6.9 g/dL (ref 6.5–8.1)

## 2017-10-18 LAB — D-DIMER, QUANTITATIVE: D-Dimer, Quant: 0.79 ug/mL-FEU — ABNORMAL HIGH (ref 0.00–0.50)

## 2017-10-18 LAB — CBC
HCT: 37.1 % (ref 36.0–46.0)
HEMOGLOBIN: 11.9 g/dL — AB (ref 12.0–15.0)
MCH: 28.7 pg (ref 26.0–34.0)
MCHC: 32.1 g/dL (ref 30.0–36.0)
MCV: 89.6 fL (ref 78.0–100.0)
Platelets: 233 10*3/uL (ref 150–400)
RBC: 4.14 MIL/uL (ref 3.87–5.11)
RDW: 14.2 % (ref 11.5–15.5)
WBC: 7 10*3/uL (ref 4.0–10.5)

## 2017-10-18 LAB — BASIC METABOLIC PANEL
ANION GAP: 11 (ref 5–15)
BUN: 24 mg/dL — AB (ref 6–20)
CO2: 27 mmol/L (ref 22–32)
Calcium: 8.9 mg/dL (ref 8.9–10.3)
Chloride: 101 mmol/L (ref 101–111)
Creatinine, Ser: 0.86 mg/dL (ref 0.44–1.00)
GFR calc Af Amer: >60 mL/min (ref >60)
GFR calc non Af Amer: >60 mL/min (ref >60)
GLUCOSE: 229 mg/dL — AB (ref 65–99)
Potassium: 4.3 mmol/L (ref 3.5–5.1)
Sodium: 139 mmol/L (ref 135–145)

## 2017-10-18 LAB — I-STAT TROPONIN, ED: Troponin i, poc: 0 ng/mL (ref 0.00–0.08)

## 2017-10-18 LAB — BRAIN NATRIURETIC PEPTIDE: B Natriuretic Peptide: 174.9 pg/mL — ABNORMAL HIGH (ref 0.0–100.0)

## 2017-10-18 MED ORDER — SODIUM CHLORIDE 0.9 % IV SOLN: 250.0000 mL | INTRAVENOUS | Status: DC | PRN

## 2017-10-18 MED ORDER — ONDANSETRON HCL 4 MG/2ML IJ SOLN: 4.0000 mg | Freq: Four times a day (QID) | INTRAMUSCULAR | Status: DC | PRN

## 2017-10-18 MED ORDER — INSULIN ASPART 100 UNIT/ML ~~LOC~~ SOLN
0.0000 [IU] | Freq: Every day | SUBCUTANEOUS | Status: DC
  Administered 2017-10-20: 2 [IU] via SUBCUTANEOUS

## 2017-10-18 MED ORDER — ENSURE ENLIVE PO LIQD
237.0000 mL | Freq: Four times a day (QID) | ORAL | Status: DC
  Administered 2017-10-19 – 2017-10-21 (×7): 237 mL via ORAL
  Filled 2017-10-18: qty 237

## 2017-10-18 MED ORDER — ENOXAPARIN SODIUM 40 MG/0.4ML ~~LOC~~ SOLN
40.0000 mg | SUBCUTANEOUS | Status: DC
  Administered 2017-10-19 – 2017-10-20 (×3): 40 mg via SUBCUTANEOUS
  Filled 2017-10-18 (×3): qty 0.4

## 2017-10-18 MED ORDER — HYDROCORTISONE 5 MG PO TABS: 5.0000 mg | ORAL_TABLET | Freq: Every day | ORAL | Status: DC

## 2017-10-18 MED ORDER — ACETAMINOPHEN 325 MG PO TABS: 650.0000 mg | ORAL_TABLET | ORAL | Status: DC | PRN

## 2017-10-18 MED ORDER — PRAMIPEXOLE DIHYDROCHLORIDE 1 MG PO TABS
1.0000 mg | ORAL_TABLET | Freq: Every day | ORAL | Status: DC
  Administered 2017-10-19: 1 mg via ORAL
  Filled 2017-10-18 (×2): qty 1

## 2017-10-18 MED ORDER — SODIUM CHLORIDE 0.9% FLUSH
3.0000 mL | Freq: Two times a day (BID) | INTRAVENOUS | Status: DC
  Administered 2017-10-19 – 2017-10-21 (×4): 3 mL via INTRAVENOUS

## 2017-10-18 MED ORDER — OXYCODONE-ACETAMINOPHEN 5-325 MG PO TABS
1.0000 | ORAL_TABLET | Freq: Once | ORAL | Status: AC
  Administered 2017-10-18: 1 via ORAL
  Filled 2017-10-18: qty 1

## 2017-10-18 MED ORDER — INSULIN ASPART 100 UNIT/ML ~~LOC~~ SOLN
0.0000 [IU] | Freq: Three times a day (TID) | SUBCUTANEOUS | Status: DC
  Administered 2017-10-19 – 2017-10-20 (×3): 3 [IU] via SUBCUTANEOUS
  Administered 2017-10-20 – 2017-10-21 (×2): 2 [IU] via SUBCUTANEOUS
  Administered 2017-10-21: 8 [IU] via SUBCUTANEOUS

## 2017-10-18 MED ORDER — ALBUTEROL SULFATE (2.5 MG/3ML) 0.083% IN NEBU: 2.5000 mg | INHALATION_SOLUTION | RESPIRATORY_TRACT | Status: DC | PRN

## 2017-10-18 MED ORDER — IOPAMIDOL (ISOVUE-370) INJECTION 76%
INTRAVENOUS | Status: AC
  Administered 2017-10-18: 100 mL
  Filled 2017-10-18: qty 100

## 2017-10-18 MED ORDER — OFLOXACIN 0.3 % OP SOLN
1.0000 [drp] | Freq: Three times a day (TID) | OPHTHALMIC | Status: DC
  Administered 2017-10-19 – 2017-10-21 (×7): 1 [drp] via OPHTHALMIC
  Filled 2017-10-18: qty 5

## 2017-10-18 MED ORDER — BENZONATATE 100 MG PO CAPS: 200.0000 mg | ORAL_CAPSULE | Freq: Two times a day (BID) | ORAL | Status: DC | PRN

## 2017-10-18 MED ORDER — GABAPENTIN 300 MG PO CAPS
300.0000 mg | ORAL_CAPSULE | Freq: Two times a day (BID) | ORAL | Status: DC
Start: 2017-10-18 — End: 2017-10-21
  Administered 2017-10-19 – 2017-10-21 (×5): 300 mg via ORAL
  Filled 2017-10-18 (×6): qty 1

## 2017-10-18 MED ORDER — SODIUM CHLORIDE 0.9% FLUSH: 3.0000 mL | INTRAVENOUS | Status: DC | PRN

## 2017-10-18 MED ORDER — INSULIN GLARGINE 100 UNIT/ML ~~LOC~~ SOLN
60.0000 [IU] | Freq: Every day | SUBCUTANEOUS | Status: DC
  Administered 2017-10-19: 60 [IU] via SUBCUTANEOUS
  Filled 2017-10-18: qty 0.6

## 2017-10-18 MED ORDER — ZOLPIDEM TARTRATE 5 MG PO TABS
5.0000 mg | ORAL_TABLET | Freq: Every day | ORAL | Status: DC
  Administered 2017-10-19 – 2017-10-20 (×2): 5 mg via ORAL
  Filled 2017-10-18 (×3): qty 1

## 2017-10-18 MED ORDER — FUROSEMIDE 10 MG/ML IJ SOLN: 40.0000 mg | Freq: Two times a day (BID) | INTRAMUSCULAR | Status: DC

## 2017-10-18 MED ORDER — SILVER SULFADIAZINE 1 % EX CREA
1.0000 "application " | TOPICAL_CREAM | Freq: Every day | CUTANEOUS | Status: DC
  Administered 2017-10-19 – 2017-10-21 (×2): 1 via TOPICAL

## 2017-10-18 MED ORDER — INSULIN ASPART 100 UNIT/ML ~~LOC~~ SOLN
12.0000 [IU] | Freq: Three times a day (TID) | SUBCUTANEOUS | Status: DC
  Administered 2017-10-19: 12 [IU] via SUBCUTANEOUS

## 2017-10-18 MED ORDER — FUROSEMIDE 10 MG/ML IJ SOLN
40.0000 mg | Freq: Once | INTRAMUSCULAR | Status: AC
  Administered 2017-10-18: 40 mg via INTRAVENOUS
  Filled 2017-10-18: qty 4

## 2017-10-18 NOTE — Telephone Encounter (Signed)
Pt calling back this morning, talked with [facility] said she is feeling worse and wants to know if Dr. Sarajane Jews thinks she should go to ER. I didn't actually speak with her this morning.

## 2017-10-18 NOTE — ED Notes (Signed)
Pt requesting meds for back pain at this time. Pt adjusted in attempt to alleviate pain.

## 2017-10-18 NOTE — H&P (Addendum)
History and Physical    Melanie Mcdonald WFU:[id number] DOB: 1932/11/14 DOA: 10/18/2017  PCP: Laurey Morale, MD   Patient coming from: Home  Chief Complaint: Increased swelling, wt-gain, SOB   HPI: Melanie Mcdonald is a 84 y.o. female with medical history significant for insulin-dependent diabetes mellitus, chronic back pain, and chronic diastolic CHF, now presenting to the emergency department for evaluation of increased lower extremity swelling, weight gain, and exertional dyspnea.  Patient reports that she has noted increasing lower extremity edema over the past week and has gained 6 pounds over this interval.  She also reports worsening exertional dyspnea, now with dyspnea with only minimal exertion.  She denies any recent fevers or chills and denies chest pain or palpitations.  She is prescribed Lasix 40 mg twice daily, but reports that she does not want to have to urinate multiple times throughout the day and so she takes it less often, usually once a day.  ED Course: Upon arrival to the ED, patient is found to be afebrile, saturating well on room air, and with vitals otherwise normal.  EKG features normal sinus rhythm and chest x-ray is negative for acute cardiopulmonary disease.  Chemistry panel is notable for a glucose of 229 and CBC is unremarkable.  Troponin is undetectable and BNP is elevated to 175.  D-dimer is elevated to 0.79 and CTA chest is negative for PE, but notable for pulmonary edema and dilated main pulmonary artery.  Patient was treated with 40 mg IV Lasix in the emergency department.  She remained hemodynamically stable and is not in acute distress while at rest, but becomes dyspneic with minimal exertion, and will be admitted to the telemetry unit for ongoing evaluation and management of acute on chronic diastolic CHF.  Review of Systems:  All other systems reviewed and apart from HPI, are negative.  Past Medical History:  Diagnosis Date  . ANEMIA 01/30/2008  . ARTHRITIS  12/11/2007  . BACK PAIN, CHRONIC 06/23/2008  . BREAST CYST, RIGHT 12/17/2007  . Colon polyps    FRAGMENTS OF HYPERPLASTIC POLYP  . CONTACT DERMATITIS 03/10/2009  . COPD (chronic obstructive pulmonary disease) ([place])   . CORONARY ARTERY DISEASE 03/01/2007   had a normal Myoview stress test 07-13-11  . CYSTOCELE WITHOUT MENTION UTERINE PROLAPSE LAT 09/12/2010  . DEGENERATIVE JOINT DISEASE 08/07/2007  . DEPRESSION 03/01/2007  . DIABETES MELLITUS, TYPE II 08/26/2008   sees Dr. Buddy Duty   . Diverticulosis of colon (without mention of hemorrhage)   . Esophageal reflux 11/06/2007  . HYPERKERATOSIS 06/02/2009  . HYPERLIPIDEMIA 08/26/2008  . HYPERTENSION 03/01/2007  . Irritable bowel syndrome 01/26/2009  . LABYRINTHITIS 11/03/2009  . 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 NEPHROLITHIASIS 04/29/2009  . OBESITY 03/01/2007  . PERIPHERAL VASCULAR DISEASE 01/26/2009  . SYNCOPE 08/26/2008   had brain MRI on 06-28-12 showing only chronic microvascular ischemia and atrophy   . TRANSIENT ISCHEMIC ATTACK 10/20/2009   had normal brain MRA with patent vertebrals and carotids 06-28-12    Past Surgical History:  Procedure Laterality Date  . ABDOMINAL HYSTERECTOMY    . BACK SURGERY     x2  . CARDIAC CATHETERIZATION  07/2009  . CATARACT EXTRACTION    . COLONOSCOPY  11-13-07   per Dr. Deatra Ina, benign polyps, repeat in 5 yrs  . ESOPHAGOGASTRODUODENOSCOPY  11-13-07   per Dr. Deatra Ina, normal   . INTRAOCULAR LENS INSERTION    . LOOP RECORDER INSERTION N/A 07/23/2017   Procedure: LOOP RECORDER INSERTION;  Surgeon: Cristopher Peru  W, MD;  Location: [facility];  Service: Cardiovascular;  Laterality: N/A;  . SPINE SURGERY     x 3  . TONSILECTOMY, ADENOIDECTOMY, BILATERAL MYRINGOTOMY AND TUBES       reports that  has never smoked. she has never used smokeless tobacco. She reports that she does not drink alcohol or use drugs.  Allergies  Allergen Reactions  . Aspirin Other (See Comments)    Abdominal pain  .  Ciprofloxacin Nausea And Vomiting    Made pt very sick on stomach  . Clarithromycin     Made stomach "burn"  . Doxycycline Itching  . Erythromycin Nausea And Vomiting  . Lisinopril Cough  . Lyrica [Pregabalin] Other (See Comments)    Suicidal   . Macrobid [Nitrofurantoin Monohyd Macro] Nausea And Vomiting  . Metolazone Other (See Comments)    Pt stated this made her B/P drop  . Other Nausea And Vomiting    Pt cannot have any of the -mycin(s)  . Penicillins Swelling    From childhood: swelling @ injection site Has patient had a PCN reaction causing immediate rash, facial/tongue/throat swelling, SOB or lightheadedness with hypotension: Yes Has patient had a PCN reaction causing severe rash involving mucus membranes or skin necrosis: No Has patient had a PCN reaction that required hospitalization: No Has patient had a PCN reaction occurring within the last 10 years: No If all of the above answers are "NO", then may proceed with Cephalosporin use.   . Sulfonamide Derivatives Nausea And Vomiting  . Valtrex [Valacyclovir Hcl] Nausea Only    Family History  Problem Relation Age of Onset  . Lupus Mother   . COPD Father      Prior to Admission medications   Medication Sig Start Date End Date Taking? Authorizing Provider  albuterol (PROVENTIL HFA;VENTOLIN HFA) 108 (90 Base) MCG/ACT inhaler Inhale 2 puffs into the lungs every 4 (four) hours as needed for wheezing or shortness of breath. 01/16/17  Yes Nafziger, Tommi Rumps, NP  benzonatate (TESSALON) 200 MG capsule Take 200 mg by mouth 2 (two) times daily as needed for cough.   Yes [provider]  Camphor-Eucalyptus-Menthol (VICKS VAPORUB EX) Apply 1 application topically daily as needed (for congestion).   Yes [provider]  ENSURE (ENSURE) Take 237 mLs by mouth 4 (four) times daily. Reported on 02/08/2016   Yes [provider]  furosemide (LASIX) 40 MG tablet Take 1 tablet (40 mg total) by mouth 2 (two) times daily.  40 mg two times a day as needed for edema or fluid 08/31/17  Yes Laurey Morale, MD  gabapentin (NEURONTIN) 300 MG capsule Take 1 capsule (300 mg total) by mouth 2 (two) times daily. 08/31/17  Yes Laurey Morale, MD  hydrocortisone (CORTEF) 5 MG tablet Take 5 mg by mouth daily.   Yes [provider]  insulin degludec (TRESIBA FLEXTOUCH) 100 UNIT/ML SOPN FlexTouch Pen Inject 70 Units into the skin daily after breakfast.   Yes [provider]  insulin lispro (HUMALOG) 100 UNIT/ML injection Inject 24 Units into the skin 3 (three) times daily before meals.   Yes [provider]  ketoconazole (NIZORAL) 2 % cream Apply 1 application topically 2 (two) times daily as needed for irritation (between bikini lines).   Yes [provider]  losartan (COZAAR) 50 MG tablet Take 1 tablet (50 mg total) by mouth daily. Patient taking differently: Take 25 mg by mouth daily.  07/13/17  Yes Laurey Morale, MD  ofloxacin (  OCUFLOX) 0.3 % ophthalmic solution Place 1 drop into the left eye 3 (three) times daily. Started 10-15-17   Yes [provider]  OVER THE COUNTER MEDICATION [facility] caplets for Leg Cramps (Cinchona Off. 3X HPUS; Viscum Alb. 3X HPUS; Gnaphalium Poly. 3X HPUS; Rhus Tox. 6X HPUS; Aconitum Nap. 6X HPUS; Ledum Pal. 6X HPUS; Magnesia Phos. 6X HPUS. Inactive Ingredients: Lactose NF, Microcrystalline Cellulose, and Vegetable Magnesium Stearate): Take 2 caplets by mouth up to four times a day as needed for leg cramps   Yes [provider]  phenazopyridine (AZO-TABS) 95 MG tablet Take 95 mg by mouth 3 (three) times daily as needed for pain.   Yes [provider]  pramipexole (MIRAPEX) 1 MG tablet TAKE 5mg =5 TABLETS BY MOUTH daily at bedtime 08/31/17  Yes Laurey Morale, MD  silver sulfADIAZINE (SILVADENE) 1 % cream Apply 1 application topically daily. 08/29/17  Yes Evelina Bucy, DPM  zolpidem (AMBIEN) 10 MG tablet TAKE 1 TABLET BY MOUTH AT BEDTIME Patient  taking differently: Take 10 mg by mouth at bedtime 07/10/17  Yes Laurey Morale, MD  clopidogrel (PLAVIX) 75 MG tablet Take 1 tablet (75 mg total) by mouth daily. Patient not taking: Reported on 08/31/2017 07/24/17   Geradine Girt, DO  HYDROcodone-homatropine (HYDROMET) 5-1.5 MG/5ML syrup Take 5 mLs by mouth every 4 (four) hours as needed. Patient not taking: Reported on 10/18/2017 08/31/17   Laurey Morale, MD  oxyCODONE-acetaminophen (PERCOCET) 10-325 MG tablet Take 1 tablet by mouth every 6 (six) hours as needed for pain. Patient not taking: Reported on 10/18/2017 07/24/17   Geradine Girt, DO    Physical Exam: Vitals:   10/18/17 1700 10/18/17 1730 10/18/17 1800 10/18/17 1830  BP: 127/67 130/90 (!) 124/56 (!) 134/46  Pulse: 69 81 75 79  Resp: 18 18 17 18   Temp:      TempSrc:      SpO2: 96% 95% 94% 95%      Constitutional: NAD, calm, obese Eyes: PERTLA, lids and conjunctivae normal ENMT: Mucous membranes are moist. Posterior pharynx clear of any exudate or lesions.   Neck: normal, supple, no masses, no thyromegaly Respiratory: Bibasilar rales. Normal respiratory effort. No accessory muscle use.  Cardiovascular: S1 & S2 heard, regular rate and rhythm. 2+ pitting edema bilateral LE's. 2+ pedal pulses. No significant JVD. Abdomen: No distension, no tenderness, no masses palpated. Bowel sounds normal.  Musculoskeletal: no clubbing / cyanosis. No joint deformity upper and lower extremities.    Skin: Ulceration at lateral right lower leg without drainage or significant surrounding erythema, edema, or tenderness. Warm, dry, well-perfused. Neurologic: CN 2-12 grossly intact. Sensation intact. Strength 5/5 in all 4 limbs.  Psychiatric: Alert and oriented x 3. Calm, cooperative.     Labs on Admission: I have personally reviewed following labs and imaging studies  CBC: Recent Labs  Lab 10/18/17 1139  WBC 7.0  HGB 11.9*  HCT 37.1  MCV 89.6  PLT 213   Basic Metabolic  Panel: Recent Labs  Lab 10/18/17 1139  NA 139  K 4.3  CL 101  CO2 27  GLUCOSE 229*  BUN 24*  CREATININE 0.86  CALCIUM 8.9   GFR: CrCl cannot be calculated (Unknown ideal weight.). Liver Function Tests: Recent Labs  Lab 10/18/17 1609  AST 16  ALT 13*  ALKPHOS 91  BILITOT 0.8  PROT 6.9  ALBUMIN 3.5   No results for input(s): LIPASE, AMYLASE in the last 168 hours. No results for input(s): AMMONIA  in the last 168 hours. Coagulation Profile: No results for input(s): INR, PROTIME in the last 168 hours. Cardiac Enzymes: No results for input(s): CKTOTAL, CKMB, CKMBINDEX, TROPONINI in the last 168 hours. BNP (last 3 results) No results for input(s): PROBNP in the last 8760 hours. HbA1C: No results for input(s): HGBA1C in the last 72 hours. CBG: Recent Labs  Lab 10/18/17 1455  GLUCAP 164*   Lipid Profile: No results for input(s): CHOL, HDL, LDLCALC, TRIG, CHOLHDL, LDLDIRECT in the last 72 hours. Thyroid Function Tests: No results for input(s): TSH, T4TOTAL, FREET4, T3FREE, THYROIDAB in the last 72 hours. Anemia Panel: No results for input(s): VITAMINB12, FOLATE, FERRITIN, TIBC, IRON, RETICCTPCT in the last 72 hours. Urine analysis:    Component Value Date/Time   COLORURINE YELLOW 07/19/2017 1949   APPEARANCEUR CLEAR 07/19/2017 1949   LABSPEC 1.030 07/19/2017 1949   PHURINE 6.0 07/19/2017 1949   GLUCOSEU >=500 (A) 07/19/2017 1949   HGBUR NEGATIVE 07/19/2017 1949   HGBUR trace-lysed 09/12/2010 1538   BILIRUBINUR NEGATIVE 07/19/2017 1949   BILIRUBINUR N 02/09/2017 1222   KETONESUR NEGATIVE 07/19/2017 1949   PROTEINUR NEGATIVE 07/19/2017 1949   UROBILINOGEN 0.2 02/09/2017 1222   UROBILINOGEN 0.2 11/10/2014 1225   NITRITE NEGATIVE 07/19/2017 1949   LEUKOCYTESUR SMALL (A) 07/19/2017 1949   Sepsis Labs: @LABRCNTIP (procalcitonin:4,lacticidven:4) )No results found for this or any previous visit (from the past 240 hour(s)).   Radiological Exams on Admission: Dg  Chest 2 View  Result Date: 10/18/2017 CLINICAL DATA:  Weakness.  Retained fluid. EXAM: CHEST  2 VIEW COMPARISON:  None. FINDINGS: Mediastinum and hilar structures are normal. Cardiac monitor noted. Heart size normal. No focal infiltrate. No pleural effusion or pneumothorax. IMPRESSION: No acute cardiopulmonary disease. Electronically Signed   By: Marcello Moores  Register   On: 10/18/2017 12:02   Ct Angio Chest Pe W And/or Wo Contrast  Result Date: 10/18/2017 CLINICAL DATA:  Dyspnea.  Weakness. EXAM: CT ANGIOGRAPHY CHEST WITH CONTRAST TECHNIQUE: Multidetector CT imaging of the chest was performed using the standard protocol during bolus administration of intravenous contrast. Multiplanar CT image reconstructions and MIPs were obtained to evaluate the vascular anatomy. CONTRAST:  155mL ISOVUE-370 IOPAMIDOL (ISOVUE-370) INJECTION 76% COMPARISON:  04/01/2014 chest CT angiogram. Chest radiograph from earlier today. FINDINGS: Cardiovascular: The study is high quality for the evaluation of pulmonary embolism. There are no filling defects in the central, lobar, segmental or subsegmental pulmonary artery branches to suggest acute pulmonary embolism. Atherosclerotic nonaneurysmal thoracic aorta. Dilated main pulmonary artery (3.9 cm diameter), stable. Top-normal heart size. No significant pericardial fluid/thickening. Coronary atherosclerosis. Mediastinum/Nodes: Hypodense 1.2 cm left thyroid lobe nodule appears decreased from 1.7 cm. Unremarkable esophagus. Mild right paratracheal adenopathy measuring up to 1.1 cm (series 5/image 31), new. Mildly enlarged 1.2 cm subcarinal node (series 5/image 39), new. Mildly enlarged 1.0 cm prevascular left anterior mediastinal node, new. Mildly enlarged 1.1 cm right hilar node (series 5/image 30), new. Mildly enlarged 1.0 cm left hilar node (series 5/image 41), new. No axillary adenopathy. Lungs/Pleura: No pneumothorax. No pleural effusion. Peripheral right middle lobe nodule (series 7/3 mm  solid pulmonary image 56), stable since 04/01/2014, considered benign. Subsegmental scarring versus atelectasis in the dependent lower lobes. No acute consolidative airspace disease, lung masses or new significant pulmonary nodules. Mild interlobular septal thickening throughout both lungs. Upper abdomen: Small hiatal hernia. Musculoskeletal: No aggressive appearing focal osseous lesions. Moderate thoracic spondylosis. Loop recorder is noted in the subcutaneous medial ventral left chest wall. Review of the MIP images confirms the above  findings. IMPRESSION: 1. No pulmonary embolism. 2. Dilated main pulmonary artery, stable, suggesting chronic pulmonary arterial hypertension. 3. Mild interlobular septal thickening throughout both lungs, which may indicate mild pulmonary edema. 4. New mild mediastinal and bilateral hilar adenopathy, nonspecific. No follow-up is required unless as otherwise clinically warranted. This recommendation follows ACR consensus guidelines: Managing Incidental Findings on Thoracic CT: Mediastinal and Cardiovascular Findings. A White Paper of the ACR Incidental Findings Committee. J Am Coll Radiol. 2018; 15: 0488-8916. 5. Coronary atherosclerosis. 6. Small hiatal hernia. Aortic Atherosclerosis (ICD10-I70.0). Electronically Signed   By: Ilona Sorrel M.D.   On: 10/18/2017 19:48    EKG: Independently reviewed. Normal sinus rhythm.   Assessment/Plan  1. Acute on chronic diastolic CHF  - Presents with progressive leg edema, wt-gain, and DOE - Found to have pulmonary edema on imaging  - Treated in ED with Lasix 40 mg IV  - No chest pain or positive enzyme to suggest an ischemic etiology; likely secondary to non-adherence to treatment plan  - Recent echo with preserved EF, will hold-off repeating now as acute worsening is secondary to not taking medications as directed  - Continue diuresis with Lasix 40 mg IV q12h, continue cardiac monitoring, follow daily wts and I/O's, SLIV and  fluid-restrict diet, follow daily chem panel during diuresis  - Has losartan recently prescribed but not yet started, plan to initiate on discharge as appropriate  2. Insulin-dependent DM  - A1c 10.9% in October 2018  - Managed at home with Tresiba 70 units qAM and Humalog 24 units TID  - Follow CBG's, start Lantus 60 units qAM, Novolog 12 units TID, and SSI with Novolog while in hospital    DVT prophylaxis: Lovenox Code Status: DNR  Family Communication: Discussed with patient Disposition Plan: Admit to telemetry  Consults called: None Admission status: Inpatient   [name], MD [facility] Pager [phone]  If 7PM-7AM, please contact night-coverage [email or web address] Password [facility]  10/18/2017, 9:14 PM

## 2017-10-18 NOTE — ED Provider Notes (Signed)
[facility] EMERGENCY DEPARTMENT Provider Note   CSN: [id number] Arrival date & time: 10/18/17  1104     History   Chief Complaint Chief Complaint  Patient presents with  . Weakness    HPI  Melanie Mcdonald is a 84 y.o. Female history of coronary artery disease, CHF, COPD, diabetes, hypertension, hyperlipidemia and peripheral vascular disease, presents to the ED for evaluation of fluid retention and generalized weakness.  Patient reports she has been feeling generally weak and this is been worsening over the past week.  Patient reports she has had increasing fluid accumulation on her leg and reports edema so severe that she is having bilateral lower extremity pain reports "feels like my legs are going to split open".  Patient reports a 6 pound weight gain in the past week despite taking 40 mg of Lasix twice daily.  Patient denies shortness of breath or chest pain at rest, but reports with even small amounts of exertion she has worsening shortness of breath which is worse than her baseline.  She denies any chest pain over the past few days.  Called her PCP with the symptoms and was recommended to come to the ED for evaluation.  Patient denies any fevers or chills, no cough, no abdominal pain, nausea, vomiting or diarrhea.      Past Medical History:  Diagnosis Date  . ANEMIA 01/30/2008  . ARTHRITIS 12/11/2007  . BACK PAIN, CHRONIC 06/23/2008  . BREAST CYST, RIGHT 12/17/2007  . Colon polyps    FRAGMENTS OF HYPERPLASTIC POLYP  . CONTACT DERMATITIS 03/10/2009  . COPD (chronic obstructive pulmonary disease) ([place])   . CORONARY ARTERY DISEASE 03/01/2007   had a normal Myoview stress test 07-13-11  . CYSTOCELE WITHOUT MENTION UTERINE PROLAPSE LAT 09/12/2010  . DEGENERATIVE JOINT DISEASE 08/07/2007  . DEPRESSION 03/01/2007  . DIABETES MELLITUS, TYPE II 08/26/2008   sees Dr. Buddy Duty   . Diverticulosis of colon (without mention of hemorrhage)   . Esophageal reflux 11/06/2007  .  HYPERKERATOSIS 06/02/2009  . HYPERLIPIDEMIA 08/26/2008  . HYPERTENSION 03/01/2007  . Irritable bowel syndrome 01/26/2009  . LABYRINTHITIS 11/03/2009  . 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 NEPHROLITHIASIS 04/29/2009  . OBESITY 03/01/2007  . PERIPHERAL VASCULAR DISEASE 01/26/2009  . SYNCOPE 08/26/2008   had brain MRI on 06-28-12 showing only chronic microvascular ischemia and atrophy   . TRANSIENT ISCHEMIC ATTACK 10/20/2009   had normal brain MRA with patent vertebrals and carotids 06-28-12    Patient Active Problem List   Diagnosis Date Noted  . Hyperglycemia   . History of TIA (transient ischemic attack)   . CVA (cerebral vascular accident) ([place]) 07/20/2017  . Candidiasis of genitalia in female 07/20/2017  . Chest pain 07/20/2017  . Weakness 07/19/2017  . Uncontrolled type 2 diabetes mellitus with hyperglycemia, with long-term current use of insulin ([place]) 10/09/2016  . Dysarthria 10/09/2016  . Cellulitis of right foot 06/13/2016  . Gout 06/07/2016  . Leg swelling 04/05/2016  . Pain in joint, ankle and foot 12/24/2015  . Diabetic polyneuropathy associated with diabetes mellitus due to underlying condition ([place]) 06/14/2015  . Cervicogenic headache 06/14/2015  . Weakness generalized 11/13/2014  . TIA (transient ischemic attack) 11/13/2014  . Gait instability 11/13/2014  . Acute on chronic diastolic CHF (congestive heart failure), NYHA class 3 ([place]) 11/03/2014  . Obstructive apnea 11/03/2014  . DOE (dyspnea on exertion) 11/03/2014  . Thyroid nodule 04/28/2014  . Personal history of colonic polyps 03/09/2014  . Abdominal pain, unspecified site  03/09/2014  . Dizziness 02/10/2014  . Memory loss 12/30/2013  . Headache 12/30/2013  . Abdominal pain, chronic, right lower quadrant 06/03/2013  . Hypokalemia 08/15/2011  . Diarrhea 08/15/2011  . Cellulitis of left leg 08/14/2011  . Chest pain 06/08/2011  . HIP PAIN 09/15/2010  . CYSTOCELE WITHOUT MENTION UTERINE PROLAPSE LAT  09/12/2010  . Labyrinthitis 11/03/2009  . Transient cerebral ischemia 10/20/2009  . NEPHROLITHIASIS 04/29/2009  . Peripheral vascular disease ([place]) 01/26/2009  . IRRITABLE BOWEL SYNDROME 01/26/2009  . Edema 09/02/2008  . Type 2 diabetes mellitus ([place]) 08/26/2008  . Hyperlipidemia 08/26/2008  . Narcolepsy without cataplexy 08/26/2008  . HYPOGLYCEMIA 06/23/2008  . Backache 06/23/2008  . Arthropathy 12/11/2007  . Insomnia 12/11/2007  . Esophageal reflux 11/06/2007  . Osteoarthritis 08/07/2007  . OBESITY 03/01/2007  . Depression, recurrent ([place]) 03/01/2007  . SYNDROME, RESTLESS LEGS 03/01/2007  . Essential hypertension 03/01/2007  . Coronary atherosclerosis 03/01/2007    Past Surgical History:  Procedure Laterality Date  . ABDOMINAL HYSTERECTOMY    . BACK SURGERY     x2  . CARDIAC CATHETERIZATION  07/2009  . CATARACT EXTRACTION    . COLONOSCOPY  11-13-07   per Dr. Deatra Ina, benign polyps, repeat in 5 yrs  . ESOPHAGOGASTRODUODENOSCOPY  11-13-07   per Dr. Deatra Ina, normal   . INTRAOCULAR LENS INSERTION    . LOOP RECORDER INSERTION N/A 07/23/2017   Procedure: LOOP RECORDER INSERTION;  Surgeon: Evans Lance, MD;  Location: [facility];  Service: Cardiovascular;  Laterality: N/A;  . SPINE SURGERY     x 3  . TONSILECTOMY, ADENOIDECTOMY, BILATERAL MYRINGOTOMY AND TUBES      OB History    No data available       Home Medications    Prior to Admission medications   Medication Sig Start Date End Date Taking? Authorizing Provider  albuterol (PROVENTIL HFA;VENTOLIN HFA) 108 (90 Base) MCG/ACT inhaler Inhale 2 puffs into the lungs every 4 (four) hours as needed for wheezing or shortness of breath. 01/16/17  Yes Nafziger, Tommi Rumps, NP  benzonatate (TESSALON) 200 MG capsule Take 200 mg by mouth 2 (two) times daily as needed for cough.   Yes [provider]  Camphor-Eucalyptus-Menthol (VICKS VAPORUB EX) Apply 1 application topically daily as needed (for congestion).   Yes  [provider]  ENSURE (ENSURE) Take 237 mLs by mouth 4 (four) times daily. Reported on 02/08/2016   Yes [provider]  furosemide (LASIX) 40 MG tablet Take 1 tablet (40 mg total) by mouth 2 (two) times daily. 40 mg two times a day as needed for edema or fluid 08/31/17  Yes Laurey Morale, MD  gabapentin (NEURONTIN) 300 MG capsule Take 1 capsule (300 mg total) by mouth 2 (two) times daily. 08/31/17  Yes Laurey Morale, MD  hydrocortisone (CORTEF) 5 MG tablet Take 5 mg by mouth daily.   Yes [provider]  insulin degludec (TRESIBA FLEXTOUCH) 100 UNIT/ML SOPN FlexTouch Pen Inject 70 Units into the skin daily after breakfast.   Yes [provider]  insulin lispro (HUMALOG) 100 UNIT/ML injection Inject 24 Units into the skin 3 (three) times daily before meals.   Yes [provider]  ketoconazole (NIZORAL) 2 % cream Apply 1 application topically 2 (two) times daily as needed for irritation (between bikini lines).   Yes [provider]  losartan (COZAAR) 50 MG tablet Take 1 tablet (50 mg total) by mouth daily. Patient taking differently: Take 25 mg by mouth daily.  07/13/17  Yes Laurey Morale, MD  ofloxacin (OCUFLOX) 0.3 % ophthalmic solution Place 1 drop into the left eye 3 (three) times daily. Started 10-15-17   Yes [provider]  OVER THE COUNTER MEDICATION [employer] caplets for Leg Cramps (Cinchona Off. 3X HPUS; Viscum Alb. 3X HPUS; Gnaphalium Poly. 3X HPUS; Rhus Tox. 6X HPUS; Aconitum Nap. 6X HPUS; Ledum Pal. 6X HPUS; Magnesia Phos. 6X HPUS. Inactive Ingredients: Lactose NF, Microcrystalline Cellulose, and Vegetable Magnesium Stearate): Take 2 caplets by mouth up to four times a day as needed for leg cramps   Yes [provider]  phenazopyridine (AZO-TABS) 95 MG tablet Take 95 mg by mouth 3 (three) times daily as needed for pain.   Yes [provider]  pramipexole (MIRAPEX) 1 MG tablet TAKE 5mg =5 TABLETS BY MOUTH daily at  bedtime 08/31/17  Yes Laurey Morale, MD  silver sulfADIAZINE (SILVADENE) 1 % cream Apply 1 application topically daily. 08/29/17  Yes Evelina Bucy, DPM  zolpidem (AMBIEN) 10 MG tablet TAKE 1 TABLET BY MOUTH AT BEDTIME Patient taking differently: Take 10 mg by mouth at bedtime 07/10/17  Yes Laurey Morale, MD  clopidogrel (PLAVIX) 75 MG tablet Take 1 tablet (75 mg total) by mouth daily. Patient not taking: Reported on 08/31/2017 07/24/17   Geradine Girt, DO  fluconazole (DIFLUCAN) 150 MG tablet Take 1 tablet (150 mg total) once a week by mouth. Patient not taking: Reported on 08/31/2017 08/08/17   Evelina Bucy, DPM  glucose blood (ACCU-CHEK AVIVA) test strip Test once per day and diagnosis code is E 11.9 Patient not taking: Reported on 10/18/2017 06/28/16   Laurey Morale, MD  HYDROcodone-homatropine (HYDROMET) 5-1.5 MG/5ML syrup Take 5 mLs by mouth every 4 (four) hours as needed. Patient not taking: Reported on 10/18/2017 08/31/17   Laurey Morale, MD  insulin regular human CONCENTRATED (HUMULIN R U-500 KWIKPEN) 500 UNIT/ML kwikpen Inject 70 Units into the skin daily with breakfast. Patient not taking: Reported on 10/18/2017 07/24/17   Geradine Girt, DO  insulin regular human CONCENTRATED (HUMULIN R U-500 KWIKPEN) 500 UNIT/ML kwikpen Inject 30 Units into the skin 2 (two) times daily with a meal. Lunch and dinner Patient not taking: Reported on 10/18/2017 08/31/17   Laurey Morale, MD  ondansetron (ZOFRAN) 8 MG tablet Take 1 tablet (8 mg total) by mouth every 8 (eight) hours as needed for nausea or vomiting. Patient not taking: Reported on 10/18/2017 06/13/16   Laurey Morale, MD  oxyCODONE-acetaminophen (PERCOCET) 10-325 MG tablet Take 1 tablet by mouth every 6 (six) hours as needed for pain. Patient not taking: Reported on 10/18/2017 07/24/17   Geradine Girt, DO    Family History Family History  Problem Relation Age of Onset  . Lupus Mother   . COPD Father     Social  History Social History   Tobacco Use  . Smoking status: Never Smoker  . Smokeless tobacco: Never Used  Substance Use Topics  . Alcohol use: No    Alcohol/week: 0.0 oz  . Drug use: No     Allergies   Aspirin; Ciprofloxacin; Clarithromycin; Doxycycline; Erythromycin; Lisinopril; Lyrica [pregabalin]; Macrobid [nitrofurantoin monohyd macro]; Metolazone; Other; Penicillins; Sulfonamide derivatives; and Valtrex [valacyclovir hcl]   Review of Systems Review of Systems  Constitutional: Negative for chills and fever.  HENT: Negative for congestion, rhinorrhea and sore throat.   Eyes: Negative for visual disturbance.  Respiratory: Positive for shortness of breath. Negative for cough, chest tightness, wheezing  and stridor.   Cardiovascular: Positive for leg swelling. Negative for chest pain and palpitations.  Gastrointestinal: Negative for abdominal pain, diarrhea, nausea and vomiting.  Genitourinary: Negative for dysuria and frequency.  Musculoskeletal: Positive for back pain (Chronic).  Skin: Positive for wound (Chronic wound on RLE). Negative for color change and pallor.  Neurological: Positive for weakness. Negative for dizziness, light-headedness, numbness and headaches.     Physical Exam Updated Vital Signs BP (!) 124/56   Pulse 75   Temp 98.2 F (36.8 C) (Oral)   Resp 17   SpO2 94%   Physical Exam  Constitutional: She appears well-developed and well-nourished. No distress.  HENT:  Head: Normocephalic and atraumatic.  Eyes: Right eye exhibits no discharge. Left eye exhibits no discharge.  Neck: Neck supple.  Cardiovascular: Normal rate, regular rhythm, normal heart sounds and intact distal pulses.  Pulmonary/Chest: Effort normal and breath sounds normal. No stridor. No respiratory distress. She has no wheezes. She has no rales.  Normal effort, lungs clear to auscultation bilaterally  Abdominal: Soft. Bowel sounds are normal. She exhibits no distension and no mass. There  is tenderness. There is no guarding.  With very mild right upper quadrant tenderness, no guarding or rebound tenderness, negative Murphy sign, all other quadrants nontender to palpation  Musculoskeletal:  Bilateral lower extremity with 2-3+ pitting edema to the level of the shin with some mild erythema, edema slightly worse on the right Chronic wound with clean dressing on lateral aspect of right lower extremity Bilateral lower extremities are tender to palpation  Neurological: She is alert. Coordination normal.  Speech is clear, able to follow commands CN III-XII intact Normal strength in upper and lower extremities bilaterally including dorsiflexion and plantar flexion, strong and equal grip strength Sensation normal to light and sharp touch Moves extremities without ataxia, coordination intact  Skin: Skin is warm and dry. Capillary refill takes less than 2 seconds. She is not diaphoretic.  Psychiatric: She has a normal mood and affect. Her behavior is normal.  Nursing note and vitals reviewed.    ED Treatments / Results  Labs (all labs ordered are listed, but only abnormal results are displayed) Labs Reviewed  BASIC METABOLIC PANEL - Abnormal; Notable for the following components:      Result Value   Glucose, Bld 229 (*)    BUN 24 (*)    All other components within normal limits  CBC - Abnormal; Notable for the following components:   Hemoglobin 11.9 (*)    All other components within normal limits  BRAIN NATRIURETIC PEPTIDE - Abnormal; Notable for the following components:   B Natriuretic Peptide 174.9 (*)    All other components within normal limits  HEPATIC FUNCTION PANEL - Abnormal; Notable for the following components:   ALT 13 (*)    All other components within normal limits  D-DIMER, QUANTITATIVE (NOT AT [facility]) - Abnormal; Notable for the following components:   D-Dimer, Quant 0.79 (*)    All other components within normal limits  CBG MONITORING, ED - Abnormal; Notable  for the following components:   Glucose-Capillary 164 (*)    All other components within normal limits  I-STAT TROPONIN, ED    EKG  EKG Interpretation  Date/Time:  Thursday October 18 2017 11:19:50 EST Ventricular Rate:  76 PR Interval:  200 QRS Duration: 78 QT Interval:  422 QTC Calculation: 474 R Axis:   10 Text Interpretation:  Normal sinus rhythm Abnormal ECG Confirmed by Milton Ferguson [phone]) on 10/18/2017 8:54:44  PM       Radiology Dg Chest 2 View  Result Date: 10/18/2017 CLINICAL DATA:  Weakness.  Retained fluid. EXAM: CHEST  2 VIEW COMPARISON:  None. FINDINGS: Mediastinum and hilar structures are normal. Cardiac monitor noted. Heart size normal. No focal infiltrate. No pleural effusion or pneumothorax. IMPRESSION: No acute cardiopulmonary disease. Electronically Signed   By: Marcello Moores  Register   On: 10/18/2017 12:02   Ct Angio Chest Pe W And/or Wo Contrast  Result Date: 10/18/2017 CLINICAL DATA:  Dyspnea.  Weakness. EXAM: CT ANGIOGRAPHY CHEST WITH CONTRAST TECHNIQUE: Multidetector CT imaging of the chest was performed using the standard protocol during bolus administration of intravenous contrast. Multiplanar CT image reconstructions and MIPs were obtained to evaluate the vascular anatomy. CONTRAST:  110mL ISOVUE-370 IOPAMIDOL (ISOVUE-370) INJECTION 76% COMPARISON:  04/01/2014 chest CT angiogram. Chest radiograph from earlier today. FINDINGS: Cardiovascular: The study is high quality for the evaluation of pulmonary embolism. There are no filling defects in the central, lobar, segmental or subsegmental pulmonary artery branches to suggest acute pulmonary embolism. Atherosclerotic nonaneurysmal thoracic aorta. Dilated main pulmonary artery (3.9 cm diameter), stable. Top-normal heart size. No significant pericardial fluid/thickening. Coronary atherosclerosis. Mediastinum/Nodes: Hypodense 1.2 cm left thyroid lobe nodule appears decreased from 1.7 cm. Unremarkable esophagus. Mild right  paratracheal adenopathy measuring up to 1.1 cm (series 5/image 31), new. Mildly enlarged 1.2 cm subcarinal node (series 5/image 39), new. Mildly enlarged 1.0 cm prevascular left anterior mediastinal node, new. Mildly enlarged 1.1 cm right hilar node (series 5/image 30), new. Mildly enlarged 1.0 cm left hilar node (series 5/image 41), new. No axillary adenopathy. Lungs/Pleura: No pneumothorax. No pleural effusion. Peripheral right middle lobe nodule (series 7/3 mm solid pulmonary image 56), stable since 04/01/2014, considered benign. Subsegmental scarring versus atelectasis in the dependent lower lobes. No acute consolidative airspace disease, lung masses or new significant pulmonary nodules. Mild interlobular septal thickening throughout both lungs. Upper abdomen: Small hiatal hernia. Musculoskeletal: No aggressive appearing focal osseous lesions. Moderate thoracic spondylosis. Loop recorder is noted in the subcutaneous medial ventral left chest wall. Review of the MIP images confirms the above findings. IMPRESSION: 1. No pulmonary embolism. 2. Dilated main pulmonary artery, stable, suggesting chronic pulmonary arterial hypertension. 3. Mild interlobular septal thickening throughout both lungs, which may indicate mild pulmonary edema. 4. New mild mediastinal and bilateral hilar adenopathy, nonspecific. No follow-up is required unless as otherwise clinically warranted. This recommendation follows ACR consensus guidelines: Managing Incidental Findings on Thoracic CT: Mediastinal and Cardiovascular Findings. A White Paper of the ACR Incidental Findings Committee. J Am Coll Radiol. 2018; 15: 8119-1478. 5. Coronary atherosclerosis. 6. Small hiatal hernia. Aortic Atherosclerosis (ICD10-I70.0). Electronically Signed   By: Ilona Sorrel M.D.   On: 10/18/2017 19:48    Procedures Procedures (including critical care time)  Medications Ordered in ED Medications  oxyCODONE-acetaminophen (PERCOCET/ROXICET) 5-325 MG per  tablet 1 tablet (1 tablet Oral Given 10/18/17 1611)  furosemide (LASIX) injection 40 mg (40 mg Intravenous Given 10/18/17 1700)  iopamidol (ISOVUE-370) 76 % injection (100 mLs  Contrast Given 10/18/17 1919)     Initial Impression / Assessment and Plan / ED Course  I have reviewed the triage vital signs and the nursing notes.  Pertinent labs & imaging results that were available during my care of the patient were reviewed by me and considered in my medical decision making (see chart for details).  Patient presents today for weakness and worsening fluid overload.  Patient reports 6 pound weight gain in the last week with severe  painful bilateral lower extremity swelling, and worsening dyspnea with exertion.  No chest pain.  But vitals otherwise normal, lungs are clear to auscultation no increased work of breathing, patient does have bilateral 2-3+ pitting edema of the lower extremities which is tender to palpation.  Concern for worsening fluid overload despite twice daily Lasix.  Labs show no leukocytosis and stable hemoglobin, no electrolyte derangements requiring intervention and kidney function at baseline, negative troponin and no acute changes to EKG, patient does have elevated d-dimer of 0.79, will get bilateral lower extremity venous studies to rule out DVT.  BNP slightly elevated at 174.9 labs otherwise unremarkable.  40 of IV Lasix given here in the ED as well as oxycodone which has significantly improve lower extremity pain.  Bilateral DVT study negative, will get CT PE study to rule out PE and then feel the patient will likely benefit from admission for continued diuresis.  CT Angie of the chest negative for PE does show some bilateral hilar lymphadenopathy, but no follow-up recommended at this time.  Will consult [facility] hospitalist for admission  Patient seen and evaluated by Dr. Roderic Palau as well who is in agreement with plan.  8:19 PM spoke with Dr. Myna Hidalgo with [facility] hospitalist who will see  and admit the patient.  Final Clinical Impressions(s) / ED Diagnoses   Final diagnoses:  Acute on chronic congestive heart failure, unspecified heart failure type [facility])  Generalized weakness  Bilateral lower extremity edema    ED Discharge Orders    None       Janet Berlin 10/18/17 2054    Milton Ferguson, MD 10/18/17 2340

## 2017-10-18 NOTE — Progress Notes (Signed)
Bilateral lower extremity venous duplex has been completed. Negative for DVT. Results were given to Benedetto Goad PA.  10/18/17 6:30 PM Carlos Levering RVT

## 2017-10-18 NOTE — ED Notes (Signed)
Patient asking if she can take all of her home meds from her bag in her room.  This RN encouraged patient to wait until the pharmacist has reviewed all of her meds.  Patient resistant, but agreed at this time.

## 2017-10-18 NOTE — ED Notes (Signed)
Patient transported to CT 

## 2017-10-18 NOTE — ED Notes (Signed)
Patient self-administered 24 units of Humalog into her left lower abdomen, okay'd by PA.

## 2017-10-18 NOTE — ED Notes (Signed)
Vascular at bedside

## 2017-10-18 NOTE — ED Notes (Signed)
PA at bedside.

## 2017-10-18 NOTE — ED Notes (Signed)
Patient has asked about being able to eat multiple times.  Spoke to Dr. Rogene Houston, but will not be seeing this patient, instructed to wait.  When informed, patient tells this RN she took her insulin this morning without eating.  Marylyn Ishihara, EMT-P to check CBG

## 2017-10-18 NOTE — ED Triage Notes (Signed)
Pt sent over from PCP for fluid retention and generalized weakness. Pt states increased weakness X1 week. Hx of CHF. States she has gained 6 lbs in 1 week. Pt alert and oriented X4. Vitals stable.

## 2017-10-18 NOTE — ED Notes (Signed)
Okay'd by Dr. Myna Hidalgo to give patient's home meds tonight while waiting on pharmacy to verify.  This RN went in to review patient meds and provide, but doses on the bottle are not matching up with what is ordered in the computer.  Patient made aware, and pharm tech asked to come and do a reconciliation.

## 2017-10-18 NOTE — Telephone Encounter (Signed)
Dr. Sarajane Jews stated that she needs to go to the ER.

## 2017-10-18 NOTE — ED Notes (Signed)
PA at bedside updating patient.

## 2017-10-18 NOTE — ED Notes (Signed)
Patient called out.  States she is in excruciating pain in her back and legs.  Will find EDP  to assess

## 2017-10-18 NOTE — ED Notes (Signed)
Admitting at bedside 

## 2017-10-18 NOTE — ED Notes (Signed)
Pt denis pain at nurse first.

## 2017-10-18 NOTE — ED Notes (Signed)
MD in with other patient, no provider assigned for this patient yet.  This RN went in to ask patient what she normally takes for her pain, and patient states "I'm about ready to just say I want to go home and die there instead".  This RN apologized to patient for delays and spoke to [place], [place] who agreed to speak to patient in the interim.

## 2017-10-18 NOTE — Telephone Encounter (Signed)
Called and spoke with pt. Advised her that Dr. Sarajane Jews suggested for her to go to the ER. Pt voiced understanding.

## 2017-10-19 ENCOUNTER — Ambulatory Visit: Payer: Medicare Other | Admitting: Podiatry

## 2017-10-19 ENCOUNTER — Encounter (HOSPITAL_COMMUNITY): Payer: Self-pay | Admitting: *Deleted

## 2017-10-19 ENCOUNTER — Other Ambulatory Visit: Payer: Self-pay

## 2017-10-19 DIAGNOSIS — R531 Weakness: Secondary | ICD-10-CM

## 2017-10-19 DIAGNOSIS — Z794 Long term (current) use of insulin: Secondary | ICD-10-CM

## 2017-10-19 DIAGNOSIS — E1165 Type 2 diabetes mellitus with hyperglycemia: Secondary | ICD-10-CM

## 2017-10-19 DIAGNOSIS — F339 Major depressive disorder, recurrent, unspecified: Secondary | ICD-10-CM

## 2017-10-19 DIAGNOSIS — I5033 Acute on chronic diastolic (congestive) heart failure: Secondary | ICD-10-CM

## 2017-10-19 LAB — BASIC METABOLIC PANEL
Anion gap: 9 (ref 5–15)
BUN: 24 mg/dL — ABNORMAL HIGH (ref 6–20)
CO2: 28 mmol/L (ref 22–32)
Calcium: 8.5 mg/dL — ABNORMAL LOW (ref 8.9–10.3)
Chloride: 105 mmol/L (ref 101–111)
Creatinine, Ser: 0.76 mg/dL (ref 0.44–1.00)
GFR calc non Af Amer: >60 mL/min (ref >60)
Glucose, Bld: 125 mg/dL — ABNORMAL HIGH (ref 65–99)
POTASSIUM: 3.8 mmol/L (ref 3.5–5.1)
SODIUM: 142 mmol/L (ref 135–145)

## 2017-10-19 LAB — GLUCOSE, CAPILLARY
GLUCOSE-CAPILLARY: 155 mg/dL — AB (ref 65–99)
GLUCOSE-CAPILLARY: 61 mg/dL — AB (ref 65–99)
GLUCOSE-CAPILLARY: 87 mg/dL (ref 65–99)
Glucose-Capillary: 188 mg/dL — ABNORMAL HIGH (ref 65–99)
Glucose-Capillary: 199 mg/dL — ABNORMAL HIGH (ref 65–99)

## 2017-10-19 LAB — MRSA PCR SCREENING: MRSA BY PCR: NEGATIVE

## 2017-10-19 MED ORDER — INSULIN GLARGINE 100 UNIT/ML ~~LOC~~ SOLN
30.0000 [IU] | Freq: Every day | SUBCUTANEOUS | Status: DC
  Administered 2017-10-20 – 2017-10-21 (×2): 30 [IU] via SUBCUTANEOUS
  Filled 2017-10-19 (×2): qty 0.3

## 2017-10-19 MED ORDER — POTASSIUM CHLORIDE 20 MEQ PO PACK
40.0000 meq | PACK | Freq: Once | ORAL | Status: AC
  Administered 2017-10-19: 40 meq via ORAL
  Filled 2017-10-19 (×2): qty 2

## 2017-10-19 MED ORDER — PRAMIPEXOLE DIHYDROCHLORIDE 1.5 MG PO TABS
5.0000 mg | ORAL_TABLET | Freq: Every day | ORAL | Status: DC
  Administered 2017-10-19 – 2017-10-20 (×2): 5 mg via ORAL
  Filled 2017-10-19 (×2): qty 2

## 2017-10-19 MED ORDER — OXYCODONE-ACETAMINOPHEN 5-325 MG PO TABS
1.0000 | ORAL_TABLET | Freq: Four times a day (QID) | ORAL | Status: DC | PRN
  Administered 2017-10-19: 1 via ORAL
  Administered 2017-10-19: 2 via ORAL
  Filled 2017-10-19: qty 2
  Filled 2017-10-19: qty 1

## 2017-10-19 MED ORDER — FUROSEMIDE 10 MG/ML IJ SOLN
40.0000 mg | Freq: Two times a day (BID) | INTRAMUSCULAR | Status: DC
  Administered 2017-10-19 – 2017-10-20 (×4): 40 mg via INTRAVENOUS
  Filled 2017-10-19 (×4): qty 4

## 2017-10-19 MED ORDER — INSULIN ASPART 100 UNIT/ML ~~LOC~~ SOLN
6.0000 [IU] | Freq: Three times a day (TID) | SUBCUTANEOUS | Status: DC
  Administered 2017-10-20 – 2017-10-21 (×4): 6 [IU] via SUBCUTANEOUS

## 2017-10-19 NOTE — Evaluation (Signed)
Physical Therapy Evaluation Patient Details Name: Melanie Mcdonald MRN: [id number] DOB: [birth year] Today's Date: 10/19/2017   History of Present Illness  Pt is an 84 y.o. female admitted 10/18/17 for ongoing evaluation and management of acute on chronic diastolic CHF. CTA chest is negative for PE, but notable for pulmonary edema and dilated main pulmonary artery. Of note, admitted 2 months ago with MRI showing several subcentimeter foci of acute/early subacute infarcts.     Clinical Impression  Pt presents with an overall decrease in functional mobility secondary to above. PTA, pt mod indep with amb using rollator and limited ADLs; was working with [place] and has [place] assist with bathing. Today, pt able to transfer and amb short distance using rollator and supervision for safety; limited by decreased activity tolerance. Educ on importance of fall risk reduction and calling for assist while admitted. Pt would benefit from continued acute PT services to maximize functional mobility and independence prior to d/c with continued HHPT services.     Follow Up Recommendations Home health PT;Supervision - Intermittent    Equipment Recommendations  None recommended by PT    Recommendations for Other Services       Precautions / Restrictions Precautions Precautions: Fall Restrictions Weight Bearing Restrictions: No      Mobility  Bed Mobility Overal bed mobility: Needs Assistance             General bed mobility comments: Received sitting EOB. Pt reports she has hospital bed at home  Transfers Overall transfer level: Needs assistance Equipment used: 4-wheeled walker Transfers: Sit to/from Stand Sit to Stand: Supervision         General transfer comment: Cues to make sure pt locks rollator prior to transferring. Stood x3 with good technique  Ambulation/Gait Ambulation/Gait assistance: Network engineer (Feet): 100 Feet Assistive device: 4-wheeled walker Gait  Pattern/deviations: Step-through pattern;Decreased stride length;Trunk flexed Gait velocity: Decreased Gait velocity interpretation: <1.8 ft/sec, indicative of risk for recurrent falls General Gait Details: Slow, controlled amb with rollator; declining further distance secondary to fatigue  Stairs            Wheelchair Mobility    Modified Rankin (Stroke Patients Only)       Balance Overall balance assessment: Needs assistance   Sitting balance-Leahy Scale: Good Sitting balance - Comments: Indep to don shoes sitting EOB      Standing balance-Leahy Scale: Fair Standing balance comment: Able to perform pericare with no UE support and supervision; dynamic stability improved with BUE support                             Pertinent Vitals/Pain Pain Assessment: No/denies pain    Home Living Family/patient expects to be discharged to:: Private residence Living Arrangements: Children Available Help at Discharge: Family;Available PRN/intermittently;Home health Type of Home: House Home Access: Stairs to enter Entrance Stairs-Rails: Right;Left Entrance Stairs-Number of Steps: 8(2 sets of stairs; ramp available) Home Layout: One level Home Equipment: Walker - 4 wheels;Bedside commode;Shower seat;Grab bars - toilet;Hospital bed Additional Comments: Pt reports that son is living with her during week but works during the day    Prior Function Level of Independence: Needs assistance   Gait / Transfers Assistance Needed: Uses rollator for all mobility; does not drive  ADL's / Homemaking Assistance Needed: Has [facility]HR[facility] assist with bathing 2x/wk        Hand Dominance        Extremity/Trunk Assessment   Upper Extremity  Assessment Upper Extremity Assessment: Overall WFL for tasks assessed    Lower Extremity Assessment Lower Extremity Assessment: Generalized weakness       Communication   Communication: No difficulties  Cognition Arousal/Alertness:  Awake/alert Behavior During Therapy: WFL for tasks assessed/performed Overall Cognitive Status: Within Functional Limits for tasks assessed                                        General Comments General comments (skin integrity, edema, etc.): Pt adamently declining use of bed alarm; max encouragement of fall risk reduction, safety, and importance of calling for assistance (RN/NT aware)    Exercises     Assessment/Plan    PT Assessment Patient needs continued PT services  PT Problem List Decreased strength;Decreased activity tolerance;Decreased balance;Decreased mobility       PT Treatment Interventions DME instruction;Gait training;Stair training;Functional mobility training;Therapeutic activities;Therapeutic exercise;Balance training;Patient/family education    PT Goals (Current goals can be found in the Care Plan section)  Acute Rehab PT Goals Patient Stated Goal: Return home PT Goal Formulation: With patient Time For Goal Achievement: 11/02/17 Potential to Achieve Goals: Good    Frequency Min 3X/week   Barriers to discharge        Co-evaluation               AM-PAC PT "6 Clicks" Daily Activity  Outcome Measure Difficulty turning over in bed (including adjusting bedclothes, sheets and blankets)?: A Little Difficulty moving from lying on back to sitting on the side of the bed? : A Little Difficulty sitting down on and standing up from a chair with arms (e.g., wheelchair, bedside commode, etc,.)?: A Little Help needed moving to and from a bed to chair (including a wheelchair)?: A Little Help needed walking in hospital room?: A Little Help needed climbing 3-5 steps with a railing? : A Little 6 Click Score: 18    End of Session Equipment Utilized During Treatment: Gait belt Activity Tolerance: Patient tolerated treatment well;Patient limited by fatigue Patient left: in bed;with call bell/phone within reach Nurse Communication: Mobility  status(Declining bed alarm; door left open) PT Visit Diagnosis: Other abnormalities of gait and mobility (R26.89)    Time: 1025-1050 PT Time Calculation (min) (ACUTE ONLY): 25 min   Charges:   PT Evaluation $PT Eval Moderate Complexity: 1 Mod PT Treatments $Gait Training: 8-22 mins   PT G Codes:       Mabeline Caras, PT, DPT Acute Rehab Services  Pager: [place] 10/19/2017, 10:58 AM

## 2017-10-19 NOTE — Progress Notes (Signed)
Inpatient Diabetes Program Recommendations  AACE/ADA: New Consensus Statement on Inpatient Glycemic Control (2015)  Target Ranges:  Prepandial:   less than 140 mg/dL      Peak postprandial:   less than 180 mg/dL (1-2 hours)      Critically ill patients:  140 - 180 mg/dL   Lab Results  Component Value Date   GLUCAP 188 (H) 10/19/2017   HGBA1C 10.9 (H) 07/20/2017    Review of Glycemic Control  Diabetes history: DM2 Outpatient Diabetes medications: Tresiba 70 units QAM, Humalog 24 units tidwc Current orders for Inpatient glycemic control: Lantus 30 units QD, Novolog 0-15 units tidwc and hs + 6 units tidwc  HgbA1C of 10.9% results from 07/20/2017.  Inpatient Diabetes Program Recommendations:     Agree with orders. Will likely need daily titration of basal-bolus insulin. Need updated HgbA1C to assess glycemic control PTA.  Continue to follow.  Thank you. Lorenda Peck, RD, LDN, CDE Inpatient Diabetes Coordinator [phone]

## 2017-10-19 NOTE — Progress Notes (Signed)
PROGRESS NOTE    Melanie Mcdonald  OFB:[id number] DOB: 07-22-1933 DOA: 10/18/2017 PCP: Laurey Morale, MD    Brief Narrative:  84 year old female who presented with dyspnea, lower extremity edema and weight gain. Patient does have significant past medical history of type 2 diabetes mellitus, chronic pain syndrome, and chronic diastolic heart failure. Over last week prior to hospitalization patient noted worsening lower extremity edema associated with a 6 pound weight gain, along with worsening dyspnea to the point where she became dyspneic with minimal efforts. She has been noncompliant with her diabetic therapy. On initial physical examination blood pressure 127/67, heart rate 69, respiratory 18, oxygen saturation 96%. Moist mucous membranes, lungs with bibasilar rales, heart S1-S2 present rhythmic locales, rubs or murmurs, abdomen soft nontender, lower extremities with 2+ pitting edema bilaterally, positive ulceration of the lateral right lower leg.   Patient was admitted also working diagnosis acute on chronic diastolic heart failure.  Assessment & Plan:   Principal Problem:   Acute on chronic diastolic CHF (congestive heart failure), NYHA class 3 (HCC) Active Problems:   Depression, recurrent ([place])   Uncontrolled type 2 diabetes mellitus with hyperglycemia, with long-term current use of insulin (HCC)   History of TIA (transient ischemic attack)   Chronic pain   Acute on chronic diastolic CHF (congestive heart failure) ([place])   1. Acute on chronic diastolic heart failure. Patient tolerating well diuresis, will continue furosemide, will target negative fluid balance, 1,060 urine output over last 24 hours. Holding losartan for now to prevent hypotension.   2. Hypertension. Systolic blood pressure 782, will continue diuresis with furosemide, will hold on losartan.   3. Type 2 diabetes mellitus. Continue insulin sliding scale for glucose cover and monitoring, capillary glucose 72, 155, 61,  87, 188. Patient tolerating po well. Will decrease dose of insulin glargine to 30 units, and aspart to 6 units before meals.   4. Depression. No confusion or agitation.    DVT prophylaxis: scd  Code Status: dnr Family Communication: no family at the bedside Disposition Plan: hojme   Consultants:     Procedures:     Antimicrobials:       Subjective: Patient is feeling better, dyspnea and lower extremity edema are improving, no chest pain, nausea or vomiting. Has been not compliant with her medications.   Objective: Vitals:   10/18/17 2300 10/18/17 2315 10/19/17 0021 10/19/17 0521  BP: (!) 119/51  (!) 152/54 (!) 117/52  Pulse:  72 80 65  Resp:  (!) 25 18 18   Temp:   98 F (36.7 C) 97.6 F (36.4 C)  TempSrc:   Oral Oral  SpO2:  95% 99% 98%  Weight:   104.3 kg (230 lb)   Height:   5\' 9"  (1.753 m)     Intake/Output Summary (Last 24 hours) at 10/19/2017 0951 Last data filed at 10/19/2017 4235 Gross per 24 hour  Intake 480 ml  Output 1300 ml  Net -820 ml   Filed Weights   10/19/17 0021  Weight: 104.3 kg (230 lb)    Examination:   General: Not in pain or dyspnea, deconditioned Neurology: Awake and alert, non focal  E ENT: mild pallor, no icterus, oral mucosa moist Cardiovascular: No JVD. S1-S2 present, rhythmic, no gallops, rubs, or murmurs. ++  lower extremity edema. Pulmonary: decreased breath sounds bilaterally at bases, adequate air movement, no wheezing, rhonchi or rales. Gastrointestinal. Abdomen flat, no organomegaly, non tender, no rebound or guarding Skin. Right leg lateral ulceration  Musculoskeletal: no  joint deformities     Data Reviewed: I have personally reviewed following labs and imaging studies  CBC: Recent Labs  Lab 10/18/17 1139  WBC 7.0  HGB 11.9*  HCT 37.1  MCV 89.6  PLT 160   Basic Metabolic Panel: Recent Labs  Lab 10/18/17 1139 10/19/17 0607  NA 139 142  K 4.3 3.8  CL 101 105  CO2 27 28  GLUCOSE 229* 125*  BUN 24*  24*  CREATININE 0.86 0.76  CALCIUM 8.9 8.5*   GFR: Estimated Creatinine Clearance: 69.7 mL/min (by C-G formula based on SCr of 0.76 mg/dL). Liver Function Tests: Recent Labs  Lab 10/18/17 1609  AST 16  ALT 13*  ALKPHOS 91  BILITOT 0.8  PROT 6.9  ALBUMIN 3.5   No results for input(s): LIPASE, AMYLASE in the last 168 hours. No results for input(s): AMMONIA in the last 168 hours. Coagulation Profile: No results for input(s): INR, PROTIME in the last 168 hours. Cardiac Enzymes: No results for input(s): CKTOTAL, CKMB, CKMBINDEX, TROPONINI in the last 168 hours. BNP (last 3 results) No results for input(s): PROBNP in the last 8760 hours. HbA1C: No results for input(s): HGBA1C in the last 72 hours. CBG: Recent Labs  Lab 10/18/17 1455 10/18/17 2242 10/19/17 0732  GLUCAP 164* 72 155*   Lipid Profile: No results for input(s): CHOL, HDL, LDLCALC, TRIG, CHOLHDL, LDLDIRECT in the last 72 hours. Thyroid Function Tests: No results for input(s): TSH, T4TOTAL, FREET4, T3FREE, THYROIDAB in the last 72 hours. Anemia Panel: No results for input(s): VITAMINB12, FOLATE, FERRITIN, TIBC, IRON, RETICCTPCT in the last 72 hours.    Radiology Studies: I have reviewed all of the imaging during this hospital visit personally     Scheduled Meds: . enoxaparin (LOVENOX) injection  40 mg Subcutaneous Q24H  . feeding supplement (ENSURE ENLIVE)  237 mL Oral QID  . furosemide  40 mg Intravenous BID  . gabapentin  300 mg Oral BID  . insulin aspart  0-15 Units Subcutaneous TID WC  . insulin aspart  0-5 Units Subcutaneous QHS  . insulin aspart  12 Units Subcutaneous TID WC  . insulin glargine  60 Units Subcutaneous Daily  . ofloxacin  1 drop Left Eye TID  . pramipexole  1 mg Oral QHS  . silver sulfADIAZINE  1 application Topical Daily  . sodium chloride flush  3 mL Intravenous Q12H  . zolpidem  5 mg Oral QHS   Continuous Infusions: . sodium chloride       LOS: 1 day         Mauricio Gerome Apley, MD [facility] Pager [phone]

## 2017-10-19 NOTE — Progress Notes (Addendum)
Pt needing orders for Losartan 25mg  PO qHS (per pt report) and Hydrocortisone 5mg  PO qAM. Pt is unsure why she takes Hydrocortisone. Pt also needs current Mirapex order edited to 5mg  qHS instead of 1mg  per PTA med list.

## 2017-10-19 NOTE — Care Management Note (Signed)
Case Management Note  Patient Details  Name: Melanie Mcdonald MRN: [id number] Date of Birth: [birth year]  Subjective/Objective:  CHF                 Action/Plan: Lives at home with spouse; PCP: Laurey Morale, MD; has private insurance with [facility] with prescription drug coverage; awaiting for Physical Therapy eval for disposition needs; CM will continue to follow for progression of care.   Expected Discharge Date:  10/22/17               Expected Discharge Plan:  [place]  Discharge planning Services  CM Consult  Status of Service:  In process, will continue to follow  Sherrilyn Rist [phone] 10/19/2017, 10:42 AM

## 2017-10-19 NOTE — Consult Note (Signed)
[place] Nurse wound consult note Reason for Consult: leg and toe Wound type: full thickness trauma wound right lateral calf, callous medial great toe Pressure Injury POA: NA Measurement: 3cm x 1.0cm x 0.1cm right lateral calf 0.5cm x 0.5cm x 0 cm right medial toe Wound bed: Right lateral: clean, some fibrin Right medial toe; 100% hard, dark centrally but calculous   Drainage (amount, consistency, odor) minimal from the calf wound, non purulent Periwound: 2+ edema bilaterally, palpable pulses  Dressing procedure/placement/frequency: Patient is followed by podiatry as outpatient (Dr. March Rummage), continue POC with silvadene. No topical care needed for the toe, however the patient uses OTC antiseptic at home (noted orange color).  Discussed POC with patient and bedside nurse.  Re consult if needed, will not follow at this time. Thanks  Andon Villard [employer], RN,CWOCN, CNS, [place] [address] [phone])

## 2017-10-20 DIAGNOSIS — Z8673 Personal history of transient ischemic attack (TIA), and cerebral infarction without residual deficits: Secondary | ICD-10-CM

## 2017-10-20 LAB — GLUCOSE, CAPILLARY
GLUCOSE-CAPILLARY: 248 mg/dL — AB (ref 65–99)
Glucose-Capillary: 163 mg/dL — ABNORMAL HIGH (ref 65–99)
Glucose-Capillary: 99 mg/dL (ref 65–99)

## 2017-10-20 LAB — BASIC METABOLIC PANEL
ANION GAP: 12 (ref 5–15)
BUN: 25 mg/dL — ABNORMAL HIGH (ref 6–20)
CALCIUM: 9.3 mg/dL (ref 8.9–10.3)
CHLORIDE: 100 mmol/L — AB (ref 101–111)
CO2: 26 mmol/L (ref 22–32)
Creatinine, Ser: 0.76 mg/dL (ref 0.44–1.00)
GFR calc non Af Amer: >60 mL/min (ref >60)
Glucose, Bld: 255 mg/dL — ABNORMAL HIGH (ref 65–99)
POTASSIUM: 3.8 mmol/L (ref 3.5–5.1)
Sodium: 138 mmol/L (ref 135–145)

## 2017-10-20 MED ORDER — POTASSIUM CHLORIDE 20 MEQ PO PACK
40.0000 meq | PACK | Freq: Once | ORAL | Status: AC
  Administered 2017-10-20: 40 meq via ORAL
  Filled 2017-10-20: qty 2

## 2017-10-20 NOTE — Progress Notes (Signed)
PROGRESS NOTE    DEMIYAH FISCHBACH  NWG:[id number] DOB: 1933/03/28 DOA: 10/18/2017 PCP: Laurey Morale, MD    Brief Narrative:  84 year old female who presented with dyspnea, lower extremity edema and weight gain. Patient does have significant past medical history of type 2 diabetes mellitus, chronic pain syndrome, and chronic diastolic heart failure. Over last week prior to hospitalization patient noted worsening lower extremity edema associated with a 6 pound weight gain, along with worsening dyspnea to the point where she became dyspneic with minimal efforts. She has been noncompliant with her diabetic therapy. On initial physical examination blood pressure 127/67, heart rate 69, respiratory 18, oxygen saturation 96%. Moist mucous membranes, lungs with bibasilar rales, heart S1-S2 present rhythmic locales, rubs or murmurs, abdomen soft nontender, lower extremities with 2+ pitting edema bilaterally, positive ulceration of the lateral right lower leg.  sodium 139, potassium 4.3, chlorthalidone 1, bicarbonate 27, glucose 229, BUN 24, creatinine 0.86, white count 7.0, hemoglobin 9.9, hematocrit 37.1, platelets 233, d-dimer 0.79, urinalysis negative for infection, this x-ray with left rotation, increased vascular congestion, CT chest with bilateral scattered groundglass opacities negative for pulmonary embolism. EKG with a normal sinus rhythm, 76 bpm.   Patient was admitted with the working diagnosis acute on chronic diastolic heart failure.   Assessment & Plan:   Principal Problem:   Acute on chronic diastolic CHF (congestive heart failure), NYHA class 3 (HCC) Active Problems:   Depression, recurrent ([place])   Uncontrolled type 2 diabetes mellitus with hyperglycemia, with long-term current use of insulin (HCC)   History of TIA (transient ischemic attack)   Chronic pain   Acute on chronic diastolic CHF (congestive heart failure) ([place])  1. Acute on chronic diastolic heart failure. 1,000 urine  output over last 24 hours. Continue to improve symptoms and edema, will continue to hold losartan. Echocardiogram from 57/8469 with LV systolic function 60 to 62%. Heart rate 12, old records personally reviewed, no evidence of contraindication for b blockade will start patient on low dose carvedilol.   2. Hypertension. Systolic blood pressure 952, tolerating well diuresis with furosemide. Holding losartan.   3. Type 2 diabetes mellitus. On insulin sliding scale for glucose cover and monitoring, capillary glucose 61, 87, 188, 199, 163. Basal insulin 30 units and pre-meal 6 units of aspart.   4. Depression. Patient with no confusion or agitation.    DVT prophylaxis: scd  Code Status: dnr Family Communication: no family at the bedside Disposition Plan: hojme   Consultants:     Procedures:     Antimicrobials:       Subjective: Patient feeling better, dyspnea continue to improve, as well as lower extremity edema, no chest pain, no nausea or vomiting.   Objective: Vitals:   10/19/17 1530 10/19/17 2000 10/20/17 0030 10/20/17 0442  BP: (!) 128/54 (!) 131/48 (!) 126/50 (!) 103/42  Pulse: 70 72 80 68  Resp: 18 18 18 18   Temp: 97.9 F (36.6 C) 97.9 F (36.6 C) 98 F (36.7 C) 97.9 F (36.6 C)  TempSrc: Oral Oral Oral Oral  SpO2: 96% 97% 96%   Weight:    103.9 kg (229 lb)  Height:        Intake/Output Summary (Last 24 hours) at 10/20/2017 0841 Last data filed at 10/20/2017 0635 Gross per 24 hour  Intake 1200 ml  Output 2675 ml  Net -1475 ml   Filed Weights   10/19/17 0021 10/20/17 0442  Weight: 104.3 kg (230 lb) 103.9 kg (229 lb)  Examination:   General: Not in pain or dyspnea, deconditioned Neurology: Awake and alert, non focal  E ENT: no pallor, no icterus, oral mucosa moist Cardiovascular: No JVD. S1-S2 present, rhythmic, no gallops, rubs, or murmurs. +/++  lower extremity edema, more right than left.  Pulmonary: mild derased breath sounds bilaterally  at bases, adequate air movement, no wheezing, rhonchi or rales. Gastrointestinal. Abdomen protuberant, no organomegaly, non tender, no rebound or guarding Skin. Dressing on right leg.  Musculoskeletal: no joint deformities     Data Reviewed: I have personally reviewed following labs and imaging studies  CBC: Recent Labs  Lab 10/18/17 1139  WBC 7.0  HGB 11.9*  HCT 37.1  MCV 89.6  PLT 811   Basic Metabolic Panel: Recent Labs  Lab 10/18/17 1139 10/19/17 0607  NA 139 142  K 4.3 3.8  CL 101 105  CO2 27 28  GLUCOSE 229* 125*  BUN 24* 24*  CREATININE 0.86 0.76  CALCIUM 8.9 8.5*   GFR: Estimated Creatinine Clearance: 69.6 mL/min (by C-G formula based on SCr of 0.76 mg/dL). Liver Function Tests: Recent Labs  Lab 10/18/17 1609  AST 16  ALT 13*  ALKPHOS 91  BILITOT 0.8  PROT 6.9  ALBUMIN 3.5   No results for input(s): LIPASE, AMYLASE in the last 168 hours. No results for input(s): AMMONIA in the last 168 hours. Coagulation Profile: No results for input(s): INR, PROTIME in the last 168 hours. Cardiac Enzymes: No results for input(s): CKTOTAL, CKMB, CKMBINDEX, TROPONINI in the last 168 hours. BNP (last 3 results) No results for input(s): PROBNP in the last 8760 hours. HbA1C: No results for input(s): HGBA1C in the last 72 hours. CBG: Recent Labs  Lab 10/19/17 1202 10/19/17 1325 10/19/17 1708 10/19/17 2111 10/20/17 0722  GLUCAP 61* 87 188* 199* 163*   Lipid Profile: No results for input(s): CHOL, HDL, LDLCALC, TRIG, CHOLHDL, LDLDIRECT in the last 72 hours. Thyroid Function Tests: No results for input(s): TSH, T4TOTAL, FREET4, T3FREE, THYROIDAB in the last 72 hours. Anemia Panel: No results for input(s): VITAMINB12, FOLATE, FERRITIN, TIBC, IRON, RETICCTPCT in the last 72 hours.    Radiology Studies: I have reviewed all of the imaging during this hospital visit personally     Scheduled Meds: . enoxaparin (LOVENOX) injection  40 mg Subcutaneous Q24H    . feeding supplement (ENSURE ENLIVE)  237 mL Oral QID  . furosemide  40 mg Intravenous BID  . gabapentin  300 mg Oral BID  . insulin aspart  0-15 Units Subcutaneous TID WC  . insulin aspart  0-5 Units Subcutaneous QHS  . insulin aspart  6 Units Subcutaneous TID WC  . insulin glargine  30 Units Subcutaneous Daily  . ofloxacin  1 drop Left Eye TID  . pramipexole  5 mg Oral QHS  . silver sulfADIAZINE  1 application Topical Daily  . sodium chloride flush  3 mL Intravenous Q12H  . zolpidem  5 mg Oral QHS   Continuous Infusions: . sodium chloride       LOS: 2 days        Mauricio Gerome Apley, MD [facility] Pager [phone]

## 2017-10-20 NOTE — Progress Notes (Signed)
Pt refusing bed alarm  Pt aware of high fall risk due to history of falls in the last 6 months  Pt still refusing  Will continue to monitor

## 2017-10-21 DIAGNOSIS — I10 Essential (primary) hypertension: Secondary | ICD-10-CM

## 2017-10-21 LAB — BASIC METABOLIC PANEL
Anion gap: 10 (ref 5–15)
BUN: 26 mg/dL — AB (ref 6–20)
CALCIUM: 9.2 mg/dL (ref 8.9–10.3)
CHLORIDE: 102 mmol/L (ref 101–111)
CO2: 28 mmol/L (ref 22–32)
CREATININE: 0.66 mg/dL (ref 0.44–1.00)
Glucose, Bld: 177 mg/dL — ABNORMAL HIGH (ref 65–99)
Potassium: 4 mmol/L (ref 3.5–5.1)
SODIUM: 140 mmol/L (ref 135–145)

## 2017-10-21 LAB — GLUCOSE, CAPILLARY
GLUCOSE-CAPILLARY: 144 mg/dL — AB (ref 65–99)
Glucose-Capillary: 166 mg/dL — ABNORMAL HIGH (ref 65–99)
Glucose-Capillary: 260 mg/dL — ABNORMAL HIGH (ref 65–99)

## 2017-10-21 MED ORDER — POTASSIUM CHLORIDE ER 20 MEQ PO TBCR: 10.0000 meq | EXTENDED_RELEASE_TABLET | Freq: Every day | ORAL | 0 refills | Status: DC

## 2017-10-21 MED ORDER — CARVEDILOL 3.125 MG PO TABS: 3.1250 mg | ORAL_TABLET | Freq: Two times a day (BID) | ORAL | Status: DC

## 2017-10-21 MED ORDER — CARVEDILOL 3.125 MG PO TABS: 3.1250 mg | ORAL_TABLET | Freq: Two times a day (BID) | ORAL | 0 refills | Status: DC

## 2017-10-21 MED ORDER — ALBUTEROL SULFATE HFA 108 (90 BASE) MCG/ACT IN AERS: 2.0000 | INHALATION_SPRAY | RESPIRATORY_TRACT | 0 refills | Status: DC | PRN

## 2017-10-21 MED ORDER — LOSARTAN POTASSIUM 50 MG PO TABS: 25.0000 mg | ORAL_TABLET | Freq: Every day | ORAL | 0 refills | Status: DC

## 2017-10-21 MED ORDER — BENZONATATE 200 MG PO CAPS: 200.0000 mg | ORAL_CAPSULE | Freq: Two times a day (BID) | ORAL | 0 refills | Status: DC | PRN

## 2017-10-21 MED ORDER — FUROSEMIDE 40 MG PO TABS: 40.0000 mg | ORAL_TABLET | Freq: Two times a day (BID) | ORAL | 0 refills | Status: DC

## 2017-10-21 MED ORDER — FUROSEMIDE 40 MG PO TABS
40.0000 mg | ORAL_TABLET | Freq: Two times a day (BID) | ORAL | Status: DC
  Administered 2017-10-21: 40 mg via ORAL
  Filled 2017-10-21: qty 1

## 2017-10-21 MED ORDER — POTASSIUM CHLORIDE ER 20 MEQ PO TBCR: 20.0000 meq | EXTENDED_RELEASE_TABLET | Freq: Every day | ORAL | 0 refills | Status: DC

## 2017-10-21 NOTE — Progress Notes (Signed)
  Subjective:  Patient ID: Melanie Mcdonald, female    DOB: 11/23/1932,  MRN: [id number]  Chief Complaint  Patient presents with  . Wound Check    Right lower leg - patient states that she could not tolerate the UNA Boot and took it off the second day. It stuck to the wound and was painful.    84 y.o. female returns for f/u wound care. States she could not tolerate the [employer] so she took it off the second day.  Objective:  There were no vitals filed for this visit. General AA&O x3. Normal mood and affect.  Vascular Foot warm to touch.  Neurologic Sensation grossly diminished.  Dermatologic (Wound) R leg laceration with overlying eschar. 3 x 2 upon debridement superficial.  Orthopedic: No pain to palpation either foot.   Assessment & Plan:  Patient was evaluated and treated and all questions answered.  R leg open wound -Selective debridement as below. -Dressed with iodosorb, DSD.  Procedure: Selective Debridement of Wound Rationale: Removal of devitalized tissue from the wound to promote healing.  Pre-Debridement Wound Measurements: 3 cm x 2 cm x 0.1 cm  Post-Debridement Wound Measurements: same as pre-debridement. Type of Debridement: Selective Tissue Removed: Devitalized soft-tissue Instrumentation: 3-0 mm dermal curette Dressing: Dry, sterile, compression dressing. Disposition: Patient tolerated procedure well. Patient to return in 1 week for follow-up.   Return in about 1 week (around 08/24/2017) for Wound Care.

## 2017-10-21 NOTE — Care Management Note (Addendum)
Case Management Note  Patient Details  Name: Melanie Mcdonald MRN: [id number] Date of Birth: November 26, 1932  Subjective/Objective:       Pt presented from home with son for dyspnea.   Pt has been receiving services from [facility] (RN, PT, OT, NA) and would like to continue using for [facility] needs.  Pt has been using friend's rollator but needs one of her own.              Action/Plan: Katrina with [facility] verified that pt is receiving services and will resume services.  Jermaine with [facility] will deliver rollator to room.    Expected Discharge Date:  10/21/17               Expected Discharge Plan:  [place]  In-House Referral:  NA  Discharge planning Services  CM Consult  Post Acute Care Choice:  Durable Medical Equipment, Home Health Choice offered to:  Patient  DME Arranged:  Walker rolling with seat DME Agency:  [facility] Arranged:  PT, NA HH Agency:  [facility]  Status of Service:  Completed, signed off  If discussed at [place] of Stay Meetings, dates discussed:    Additional Comments:  Arley [place], RN 10/21/2017, 12:13 PM

## 2017-10-21 NOTE — Progress Notes (Signed)
Pt discharged via wheelchair with RN. Pt had all belongings including rolling walker.

## 2017-10-21 NOTE — Progress Notes (Addendum)
Pt got discharged to home, discharge instructions provided and patient showed understanding to it, IV taken out,Telemonitor DC,pt left unit in wheelchair with all of the belongings accompanied with a family member (Son), Rolling walker provided to her by [facility]

## 2017-10-21 NOTE — Discharge Summary (Addendum)
Physician Discharge Summary  Melanie Mcdonald:[id number] DOB: 20-Oct-1932 DOA: 10/18/2017  PCP: Laurey Morale, MD  Admit date: 10/18/2017 Discharge date: 10/21/2017  Admitted From: Home Disposition:  Home  Recommendations for Outpatient Follow-up:  1. Follow up with PCP in 1- week 2. Patient has been placed on Carvedilol for b blockade 3. Advice to be compliant with diuretic therapy and sodium restricted diet  Home Health: Yes Equipment/Devices: no   Discharge Condition: Stable CODE STATUS: Full  Diet recommendation: Heart healthy and diabetic prudent.   Brief/Interim Summary: 84 year old female who presented with dyspnea,lower extremity edema and weight gain. Patient does have significant past medical history of type 2 diabetes mellitus, chronic pain syndrome,andchronic diastolic heart failure. Over last week prior to hospitalization patient noted worsening lower extremity edema associated with a 6 pound weight gain, along with worsening dyspnea to the point where she became dyspneic with minimal efforts.She has been noncompliant with her diuretic therapy.On initial physical examination blood pressure 127/67, heart rate 69, respiratory 18, oxygen saturation 96%.Moist mucous membranes, lungs with bibasilar rales,heart S1-S2 present rhythmic, no gallop, rubs or murmurs, abdomen soft nontender,lower extremities with 2+ pitting edema bilaterally,positive ulceration of the lateral right lower leg.Sodium 139, potassium 4.3, bicarbonate 27, glucose 229, BUN 24, creatinine 0.86, white count 7.0, hemoglobin 9.9, hematocrit 37.1, platelets 233, d-dimer 0.79, urinalysis negative for infection, chest x-ray with left rotation, increased vascular congestion, CT chest with bilateral scattered groundglass opacities negative for pulmonary embolism. EKG with a normal sinus rhythm, 76 bpm.   Patient was admitted with the working diagnosis acute on chronic diastolic heart failure.  1. Acute on  chronic diastolic heart failure decompensation/ LV EF 60 to 65%. Patient was admitted to the medical unit, she was placed on a remote telemetry monitor, she received aggressive diuresis with IV furosemide, negative fluid balance was achieved, -5055 ml, with significant improvement of her symptoms. Carvedilol was added to her medical regimen, and will continue losartan 25 mg bedtime. She was strongly advised to be compliant with her medications and a sodium restricted diet.   2. Hypertension. Patient tolerated well diuresis, losartan was held during her hospitalization. Systolic blood pressure at discharge154 mmHg, patient will resume losartan 25 g bedtime along with newly added carvedilol.   3. Type 2 diabetes mellitus. Patient was placed on a insulin sliding scale for glucose coverage and monitoring, her capillary glucose remained well-controlled, she continued receiving basal insulin with Levemir as well as pre-meal aspart.   4. Depression/ mild in intensity. No confusion or agitation, patient will continue Mirapex.  Superficial laceration on the right leg, stage 2, with dressing in place, no signs of infection.   Discharge Diagnoses:  Principal Problem:   Acute on chronic diastolic CHF (congestive heart failure), NYHA class 3 (HCC) Active Problems:   Depression, recurrent ([place])   Uncontrolled type 2 diabetes mellitus with hyperglycemia, with long-term current use of insulin (HCC)   History of TIA (transient ischemic attack)   Chronic pain   Acute on chronic diastolic CHF (congestive heart failure) ([place])    Discharge Instructions   Allergies as of 10/21/2017      Reactions   Lyrica [pregabalin] Other (See Comments)   Suicidal    Aspirin Other (See Comments)   Abdominal pain   Ciprofloxacin Nausea And Vomiting   Made pt very sick on stomach   Clarithromycin Other (See Comments)   Made stomach "burn"   Doxycycline Itching   Erythromycin Nausea And Vomiting   Lisinopril Cough  Macrobid [nitrofurantoin Monohyd Macro] Nausea And Vomiting   Metolazone Other (See Comments)   Pt stated this made her B/P drop   Other Nausea And Vomiting   Pt cannot have any of the -mycin(s)   Penicillins Swelling   From childhood: swelling @ injection site Has patient had a PCN reaction causing immediate rash, facial/tongue/throat swelling, SOB or lightheadedness with hypotension: Yes Has patient had a PCN reaction causing severe rash involving mucus membranes or skin necrosis: No Has patient had a PCN reaction that required hospitalization: No Has patient had a PCN reaction occurring within the last 10 years: No If all of the above answers are "NO", then may proceed with Cephalosporin use.   Sulfonamide Derivatives Nausea And Vomiting   Valtrex [valacyclovir Hcl] Nausea Only      Medication List    TAKE these medications   albuterol 108 (90 Base) MCG/ACT inhaler Commonly known as:  PROVENTIL HFA;VENTOLIN HFA Inhale 2 puffs into the lungs every 4 (four) hours as needed for wheezing or shortness of breath.   AZO-TABS 95 MG tablet Generic drug:  phenazopyridine Take 95 mg by mouth 3 (three) times daily as needed for pain.   benzonatate 200 MG capsule Commonly known as:  TESSALON Take 1 capsule (200 mg total) by mouth 2 (two) times daily as needed for cough.   carvedilol 3.125 MG tablet Commonly known as:  COREG Take 1 tablet (3.125 mg total) by mouth 2 (two) times daily with a meal.   ENSURE Take 237 mLs by mouth 4 (four) times daily. Reported on 02/08/2016   furosemide 40 MG tablet Commonly known as:  LASIX Take 1 tablet (40 mg total) by mouth 2 (two) times daily with breakfast and lunch.   gabapentin 300 MG capsule Commonly known as:  NEURONTIN Take 1 capsule (300 mg total) by mouth 2 (two) times daily.   HUMALOG KWIKPEN 100 UNIT/ML KiwkPen Generic drug:  insulin lispro Inject 24 Units into the skin 3 (three) times daily before meals.   HYDROcodone-homatropine  5-1.5 MG/5ML syrup Commonly known as:  HYDROMET Take 5 mLs by mouth every 4 (four) hours as needed. What changed:  reasons to take this   hydrocortisone 5 MG tablet Commonly known as:  CORTEF Take 5 mg by mouth at bedtime.   ketoconazole 2 % cream Commonly known as:  NIZORAL Apply 1 application topically 2 (two) times daily as needed for irritation (between bikini lines).   losartan 50 MG tablet Commonly known as:  COZAAR Take 0.5 tablets (25 mg total) by mouth at bedtime.   ofloxacin 0.3 % ophthalmic solution Commonly known as:  OCUFLOX Place 1 drop into the left eye 3 (three) times daily. Started 10-15-17   oxyCODONE-acetaminophen 10-325 MG tablet Commonly known as:  PERCOCET Take 1 tablet by mouth every 6 (six) hours as needed for pain.   Potassium Chloride ER 20 MEQ Tbcr Take 20 mEq by mouth daily.   pramipexole 1 MG tablet Commonly known as:  MIRAPEX TAKE 5mg =5 TABLETS BY MOUTH daily at bedtime What changed:    how much to take  how to take this  when to take this  additional instructions   silver sulfADIAZINE 1 % cream Commonly known as:  SILVADENE Apply 1 application topically daily. What changed:    when to take this  additional instructions   TRESIBA FLEXTOUCH 100 UNIT/ML Sopn FlexTouch Pen Generic drug:  insulin degludec Inject 70 Units into the skin every morning.   zolpidem 10 MG tablet  Commonly known as:  AMBIEN TAKE 1 TABLET BY MOUTH AT BEDTIME What changed:    how much to take  how to take this  when to take this       Allergies  Allergen Reactions  . Lyrica [Pregabalin] Other (See Comments)    Suicidal   . Aspirin Other (See Comments)    Abdominal pain  . Ciprofloxacin Nausea And Vomiting    Made pt very sick on stomach  . Clarithromycin Other (See Comments)    Made stomach "burn"  . Doxycycline Itching  . Erythromycin Nausea And Vomiting  . Lisinopril Cough  . Macrobid [Nitrofurantoin Monohyd Macro] Nausea And Vomiting   . Metolazone Other (See Comments)    Pt stated this made her B/P drop  . Other Nausea And Vomiting    Pt cannot have any of the -mycin(s)  . Penicillins Swelling    From childhood: swelling @ injection site Has patient had a PCN reaction causing immediate rash, facial/tongue/throat swelling, SOB or lightheadedness with hypotension: Yes Has patient had a PCN reaction causing severe rash involving mucus membranes or skin necrosis: No Has patient had a PCN reaction that required hospitalization: No Has patient had a PCN reaction occurring within the last 10 years: No If all of the above answers are "NO", then may proceed with Cephalosporin use.   . Sulfonamide Derivatives Nausea And Vomiting  . Valtrex [Valacyclovir Hcl] Nausea Only    Consultations:   Procedures/Studies: Dg Chest 2 View  Result Date: 10/18/2017 CLINICAL DATA:  Weakness.  Retained fluid. EXAM: CHEST  2 VIEW COMPARISON:  None. FINDINGS: Mediastinum and hilar structures are normal. Cardiac monitor noted. Heart size normal. No focal infiltrate. No pleural effusion or pneumothorax. IMPRESSION: No acute cardiopulmonary disease. Electronically Signed   By: Marcello Moores  Register   On: 10/18/2017 12:02   Ct Angio Chest Pe W And/or Wo Contrast  Result Date: 10/18/2017 CLINICAL DATA:  Dyspnea.  Weakness. EXAM: CT ANGIOGRAPHY CHEST WITH CONTRAST TECHNIQUE: Multidetector CT imaging of the chest was performed using the standard protocol during bolus administration of intravenous contrast. Multiplanar CT image reconstructions and MIPs were obtained to evaluate the vascular anatomy. CONTRAST:  173mL ISOVUE-370 IOPAMIDOL (ISOVUE-370) INJECTION 76% COMPARISON:  04/01/2014 chest CT angiogram. Chest radiograph from earlier today. FINDINGS: Cardiovascular: The study is high quality for the evaluation of pulmonary embolism. There are no filling defects in the central, lobar, segmental or subsegmental pulmonary artery branches to suggest acute  pulmonary embolism. Atherosclerotic nonaneurysmal thoracic aorta. Dilated main pulmonary artery (3.9 cm diameter), stable. Top-normal heart size. No significant pericardial fluid/thickening. Coronary atherosclerosis. Mediastinum/Nodes: Hypodense 1.2 cm left thyroid lobe nodule appears decreased from 1.7 cm. Unremarkable esophagus. Mild right paratracheal adenopathy measuring up to 1.1 cm (series 5/image 31), new. Mildly enlarged 1.2 cm subcarinal node (series 5/image 39), new. Mildly enlarged 1.0 cm prevascular left anterior mediastinal node, new. Mildly enlarged 1.1 cm right hilar node (series 5/image 30), new. Mildly enlarged 1.0 cm left hilar node (series 5/image 41), new. No axillary adenopathy. Lungs/Pleura: No pneumothorax. No pleural effusion. Peripheral right middle lobe nodule (series 7/3 mm solid pulmonary image 56), stable since 04/01/2014, considered benign. Subsegmental scarring versus atelectasis in the dependent lower lobes. No acute consolidative airspace disease, lung masses or new significant pulmonary nodules. Mild interlobular septal thickening throughout both lungs. Upper abdomen: Small hiatal hernia. Musculoskeletal: No aggressive appearing focal osseous lesions. Moderate thoracic spondylosis. Loop recorder is noted in the subcutaneous medial ventral left chest wall. Review of the  MIP images confirms the above findings. IMPRESSION: 1. No pulmonary embolism. 2. Dilated main pulmonary artery, stable, suggesting chronic pulmonary arterial hypertension. 3. Mild interlobular septal thickening throughout both lungs, which may indicate mild pulmonary edema. 4. New mild mediastinal and bilateral hilar adenopathy, nonspecific. No follow-up is required unless as otherwise clinically warranted. This recommendation follows ACR consensus guidelines: Managing Incidental Findings on Thoracic CT: Mediastinal and Cardiovascular Findings. A White Paper of the ACR Incidental Findings Committee. J Am Coll Radiol.  2018; 15: 1540-0867. 5. Coronary atherosclerosis. 6. Small hiatal hernia. Aortic Atherosclerosis (ICD10-I70.0). Electronically Signed   By: Ilona Sorrel M.D.   On: 10/18/2017 19:48       Subjective: Patient feeling better, no dyspnea or chest pain, positive chronic neck pain, no nausea or vomiting.   Discharge Exam: Vitals:   10/20/17 2031 10/21/17 0426  BP: (!) 155/57 (!) 154/61  Pulse: 72 69  Resp: 18 18  Temp: 98.4 F (36.9 C) 98.4 F (36.9 C)  SpO2: 96% 100%   Vitals:   10/20/17 0900 10/20/17 1236 10/20/17 2031 10/21/17 0426  BP: (!) 137/55 (!) 138/52 (!) 155/57 (!) 154/61  Pulse: 73 68 72 69  Resp: 18 18 18 18   Temp: 98 F (36.7 C) 97.9 F (36.6 C) 98.4 F (36.9 C) 98.4 F (36.9 C)  TempSrc: Oral Oral Oral Oral  SpO2: 97% 97% 96% 100%  Weight:    101.5 kg (223 lb 12.3 oz)  Height:        General: Pt is alert, awake, not in acute distress E ENT. No pallor or icterus Cardiovascular: RRR, S1/S2 +, no rubs, no gallops Respiratory: CTA bilaterally, no wheezing, no rhonchi Abdominal: Soft, NT, ND, bowel sounds + Extremities: Trace edema on the right non pitting, no cyanosis Skin. Laceration on the right lateral leg.     The results of significant diagnostics from this hospitalization (including imaging, microbiology, ancillary and laboratory) are listed below for reference.     Microbiology: Recent Results (from the past 240 hour(s))  MRSA PCR Screening     Status: None   Collection Time: 10/19/17  1:15 AM  Result Value Ref Range Status   MRSA by PCR NEGATIVE NEGATIVE Final    Comment:        The GeneXpert MRSA Assay (FDA approved for NASAL specimens only), is one component of a comprehensive MRSA colonization surveillance program. It is not intended to diagnose MRSA infection nor to guide or monitor treatment for MRSA infections.      Labs: BNP (last 3 results) Recent Labs    10/18/17 1609  BNP 619.5*   Basic Metabolic Panel: Recent Labs   Lab 10/18/17 1139 10/19/17 0607 10/20/17 0849  NA 139 142 138  K 4.3 3.8 3.8  CL 101 105 100*  CO2 27 28 26   GLUCOSE 229* 125* 255*  BUN 24* 24* 25*  CREATININE 0.86 0.76 0.76  CALCIUM 8.9 8.5* 9.3   Liver Function Tests: Recent Labs  Lab 10/18/17 1609  AST 16  ALT 13*  ALKPHOS 91  BILITOT 0.8  PROT 6.9  ALBUMIN 3.5   No results for input(s): LIPASE, AMYLASE in the last 168 hours. No results for input(s): AMMONIA in the last 168 hours. CBC: Recent Labs  Lab 10/18/17 1139  WBC 7.0  HGB 11.9*  HCT 37.1  MCV 89.6  PLT 233   Cardiac Enzymes: No results for input(s): CKTOTAL, CKMB, CKMBINDEX, TROPONINI in the last 168 hours. BNP: Invalid input(s): POCBNP CBG: Recent  Labs  Lab 10/20/17 0722 10/20/17 1145 10/20/17 1629 10/20/17 2121 10/21/17 0755  GLUCAP 163* 144* 99 248* 166*   D-Dimer Recent Labs    10/18/17 1609  DDIMER 0.79*   Hgb A1c No results for input(s): HGBA1C in the last 72 hours. Lipid Profile No results for input(s): CHOL, HDL, LDLCALC, TRIG, CHOLHDL, LDLDIRECT in the last 72 hours. Thyroid function studies No results for input(s): TSH, T4TOTAL, T3FREE, THYROIDAB in the last 72 hours.  Invalid input(s): FREET3 Anemia work up No results for input(s): VITAMINB12, FOLATE, FERRITIN, TIBC, IRON, RETICCTPCT in the last 72 hours. Urinalysis    Component Value Date/Time   COLORURINE YELLOW 07/19/2017 1949   APPEARANCEUR CLEAR 07/19/2017 1949   LABSPEC 1.030 07/19/2017 1949   PHURINE 6.0 07/19/2017 1949   GLUCOSEU >=500 (A) 07/19/2017 1949   HGBUR NEGATIVE 07/19/2017 1949   HGBUR trace-lysed 09/12/2010 1538   BILIRUBINUR NEGATIVE 07/19/2017 1949   BILIRUBINUR N 02/09/2017 1222   KETONESUR NEGATIVE 07/19/2017 1949   PROTEINUR NEGATIVE 07/19/2017 1949   UROBILINOGEN 0.2 02/09/2017 1222   UROBILINOGEN 0.2 11/10/2014 1225   NITRITE NEGATIVE 07/19/2017 1949   LEUKOCYTESUR SMALL (A) 07/19/2017 1949   Sepsis Labs Invalid input(s):  PROCALCITONIN,  WBC,  LACTICIDVEN Microbiology Recent Results (from the past 240 hour(s))  MRSA PCR Screening     Status: None   Collection Time: 10/19/17  1:15 AM  Result Value Ref Range Status   MRSA by PCR NEGATIVE NEGATIVE Final    Comment:        The GeneXpert MRSA Assay (FDA approved for NASAL specimens only), is one component of a comprehensive MRSA colonization surveillance program. It is not intended to diagnose MRSA infection nor to guide or monitor treatment for MRSA infections.      Time coordinating discharge: 45 minutes  SIGNED:   Tawni Millers, MD  [facility] 10/21/2017, 8:44 AM Pager [phone]  If 7PM-7AM, please contact night-coverage [email or web address] Password [email or web address]

## 2017-10-21 NOTE — Progress Notes (Signed)
  Subjective:  Patient ID: Melanie Mcdonald, female    DOB: 1933/03/02,  MRN: [id number]  Chief Complaint  Patient presents with  . Wound Check    right lower leg  . Difficulty Walking    swelling both legs/ankles - did not take lasix    84 y.o. female returns for wound care. Believes the wound to be about the same. Reports swelling to both legs - didn't take Lasix today. Denies N/V/F/Ch.  Objective:  There were no vitals filed for this visit. General AA&O x3. Normal mood and affect.  Vascular Foot warm to touch.  Neurologic Sensation grossly diminished.  Dermatologic (Wound) R leg laceration with overlying eschar, left intact. L heel remains healed. R hallux ulcer remains healed. Edema noted to BLE.  Orthopedic: No pain to palpation either foot.   Assessment & Plan:  Patient was evaluated and treated and all questions answered.  R leg open wound -Medihoney and DSD applied. -No debridement today.  L heel wound -Remains healed  L hallux wound -remains healed. Toe sleeve dispensed as patient lost hers.  Return in about 1 week (around 08/15/2017) for Wound Care.

## 2017-10-22 ENCOUNTER — Telehealth: Payer: Self-pay | Admitting: *Deleted

## 2017-10-22 ENCOUNTER — Ambulatory Visit (INDEPENDENT_AMBULATORY_CARE_PROVIDER_SITE_OTHER): Payer: Medicare Other | Admitting: *Deleted

## 2017-10-22 DIAGNOSIS — I6340 Cerebral infarction due to embolism of unspecified cerebral artery: Secondary | ICD-10-CM

## 2017-10-22 NOTE — Telephone Encounter (Signed)
Patient not available at time of TCM call.  Will attempt call back tomorrow afternoon per her request.

## 2017-10-22 NOTE — Progress Notes (Signed)
[facility] Summary Report / Loop Recorder 

## 2017-10-22 NOTE — Telephone Encounter (Signed)
Marlowe Kays, RN - [facility] states pt was in the hospital for congestive heart failure and she needs an verbal order to resume care. I told Marlowe Kays to resume previous wound care orders.

## 2017-10-23 NOTE — Telephone Encounter (Signed)
Transition Care Management Follow-up Telephone Call  Per Discharge Summary: Admit date: 10/18/2017 Discharge date: 10/21/2017  Admitted From: Home Disposition:  Home  Recommendations for Outpatient Follow-up:  1. Follow up with PCP in 1- week 2. Patient has been placed on Carvedilol for b blockade 3. Advice to be compliant with diuretic therapy and sodium restricted diet  Home Health: Yes Equipment/Devices: no   Discharge Condition: Stable CODE STATUS: Full  Diet recommendation: Heart healthy and diabetic prudent.   --   How have you been since you were released from the hospital? "Weak, very weak. My chest doesn't hurt. They got rid of a lot of fluid around my heart and my legs. I'm taking my Lasix but I've retained a little tiny bit of fluid in my feet since I got home."   Do you understand why you were in the hospital? yes   Do you understand the discharge instructions? yes   Where were you discharged to? Home   Items Reviewed:  Medications reviewed: yes  Allergies reviewed: yes  Dietary changes reviewed: yes, low sodium, carb mod  Referrals reviewed: yes   Functional Questionnaire:   Activities of Daily Living (ADLs):   She states they are independent in the following: ambulation, feeding, continence, grooming, toileting and dressing States they require assistance with the following: bathing and hygiene. Home health is assisting w/ bathing.   Any transportation issues/concerns?: yes, patient either hires someone or son can drive her on Fridays   Any patient concerns? no   Confirmed importance and date/time of follow-up visits scheduled yes  Provider Appointment booked with Dr. Alysia Penna 10/29/17 @ 1:00pm  Confirmed with patient if condition begins to worsen call PCP or go to the ER.  Patient was given the office number and encouraged to call back with question or concerns.  : yes

## 2017-10-24 ENCOUNTER — Encounter: Payer: Self-pay | Admitting: Neurology

## 2017-10-24 ENCOUNTER — Telehealth: Payer: Self-pay | Admitting: Family Medicine

## 2017-10-24 ENCOUNTER — Ambulatory Visit: Payer: Medicare Other | Admitting: Neurology

## 2017-10-24 VITALS — BP 112/54 | HR 87 | Ht 68.0 in | Wt 225.0 lb

## 2017-10-24 DIAGNOSIS — I6349 Cerebral infarction due to embolism of other cerebral artery: Secondary | ICD-10-CM | POA: Diagnosis not present

## 2017-10-24 MED ORDER — CLOPIDOGREL BISULFATE 75 MG PO TABS: 75.0000 mg | ORAL_TABLET | Freq: Every day | ORAL | 11 refills | Status: DC

## 2017-10-24 NOTE — Patient Instructions (Signed)
1. Start Plavix 75mg  daily (blood thinner) 2. It is important to see a Cardiologist for the heart failure 3. Follow-up with Dr. Sarajane Jews as scheduled 4. Continue all your medications as prescribed 5. Follow-up in 6 months, call for any changes

## 2017-10-24 NOTE — Telephone Encounter (Signed)
Sent to PCP for the OK for verbal orders  

## 2017-10-24 NOTE — Telephone Encounter (Signed)
Copied from [place]. Topic: General - Other >> Oct 24, 2017  3:05 PM Lolita Rieger, [place] wrote: Reason for CRM: [name] called needing verbal orders to continue PT fr 2 times a week for 4 weeks  Please contact her @[phone] 

## 2017-10-24 NOTE — Progress Notes (Signed)
NEUROLOGY FOLLOW UP OFFICE NOTE  Melanie Mcdonald [id number]  DOB: 07-21-1933  HISTORY OF PRESENT ILLNESS: I had the pleasure of seeing Melanie Mcdonald in follow-up in the neurology clinic on 06/14/2015.  The patient was last seen more than a year ago for dizziness, loss of consciousness, headaches, and memory problems. She presents today after hospital admission in November for embolic-appearing strokes in the left cerebellar hemisphere, left thalamus, and left parietal subcortical white matter. She was having word-finding problems, weakness, and confusion. She had been having difficulty controlling her sugar levels, not taking her insulin regularly. Her echo showed an EF of 60-65%, left atrium severely dilated. She was unable to do TEE and now has a loop recorder. No atrial fibrillation captured so far. She was discharged home on Plavix, but has not been taking it for unclear reasons. She continues to deal with labile glucose levels, last HbA1c was 10. She has had left hand numbness and occasional drooling on the left side of her mouth since the stroke. She is also reporting her short term memory is "just shot." She has neck pain and knee pain. She was admitted for acute on chronic CHF almost a week ago, and continues to feel tired. She has bipedal edema. She does not have an appointment yet with Cardiology, and will be seeing her PCP next week.   HPI: This is an 84 yo RH woman who presented in April 2015 with a 2-year history of dizziness. She recalls feeling dizzy at church with a headache and was brought home by friends. That afternoon, she fell into a ditch near her home and was apparently there for hours because she could not move her legs. She was able to dig holes in the wall and slowly crawl out to get a passerby's attention. She was brought to the hospital and developed ?cellulitis in her left leg, requiring a boot on her left leg. As she was going out the door one time, she fell and injured her  other leg. Since then,she stated having dizziness described as "like I cannot keep my balance." There is occasional nausea and vomiting. She reports horizontal diplopia when tired, especially at night when driving. She denies any dysarthria, dysphagia, focal numbness/tingling/weakness, reporting that both feet are numb. She started having "blackout spells" where she would wake up on the floor with no recollection of events, no prior warning symptoms. Initially she was having up to 2 episodes a day. She has hit her kitchen cabinet twice and broke it. She denies any associated dizziness or focal weakness around the time of passing out. She started having headaches in the occipital region occurring daily. She reports episodes of passing out stopped after she self-discontinued her medications. Last episode occurred the week prior to her initial visit at the end of March 2015 when she woke up on the floor.   Diagnostic Data: I personally reviewed MRI brain/MRA head 12/2013 which were unremarkable, no evidence of acute or prior ischemia, there was mild intracranial atherosclerotic type changes without medium or large size vessel significant stenosis or occlusion. Her routine EEG was normal. Echo done in March 2013 showed increased LV wall thickness in a pattern of severe LVH, EF 55-60%, mild mitral stenosis, left atrium moderately dilated.  PAST MEDICAL HISTORY: Past Medical History:  Diagnosis Date  . ANEMIA 01/30/2008  . ARTHRITIS 12/11/2007  . BACK PAIN, CHRONIC 06/23/2008  . BREAST CYST, RIGHT 12/17/2007  . Cancer ([place])   . CHF (congestive heart failure) ([place])   .  Colon polyps    FRAGMENTS OF HYPERPLASTIC POLYP  . CONTACT DERMATITIS 03/10/2009  . COPD (chronic obstructive pulmonary disease) ([place])   . CORONARY ARTERY DISEASE 03/01/2007   had a normal Myoview stress test 07-13-11  . CYSTOCELE WITHOUT MENTION UTERINE PROLAPSE LAT 09/12/2010  . DEGENERATIVE JOINT DISEASE 08/07/2007  . DEPRESSION 03/01/2007    . DIABETES MELLITUS, TYPE II 08/26/2008   sees Dr. Buddy Duty   . Diverticulosis of colon (without mention of hemorrhage)   . Esophageal reflux 11/06/2007  . HYPERKERATOSIS 06/02/2009  . HYPERLIPIDEMIA 08/26/2008  . HYPERTENSION 03/01/2007  . Irritable bowel syndrome 01/26/2009  . LABYRINTHITIS 11/03/2009  . 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 NEPHROLITHIASIS 04/29/2009  . OBESITY 03/01/2007  . PERIPHERAL VASCULAR DISEASE 01/26/2009  . Stroke ([place])   . SYNCOPE 08/26/2008   had brain MRI on 06-28-12 showing only chronic microvascular ischemia and atrophy   . TRANSIENT ISCHEMIC ATTACK 10/20/2009   had normal brain MRA with patent vertebrals and carotids 06-28-12    MEDICATIONS: Current Outpatient Medications on File Prior to Visit  Medication Sig Dispense Refill  . albuterol (PROVENTIL HFA;VENTOLIN HFA) 108 (90 Base) MCG/ACT inhaler Inhale 2 puffs into the lungs every 4 (four) hours as needed for wheezing or shortness of breath. (Patient not taking: Reported on 10/23/2017) 1 Inhaler 0  . benzonatate (TESSALON) 200 MG capsule Take 1 capsule (200 mg total) by mouth 2 (two) times daily as needed for cough. 20 capsule 0  . carvedilol (COREG) 3.125 MG tablet Take 1 tablet (3.125 mg total) by mouth 2 (two) times daily with a meal. (Patient not taking: Reported on 10/23/2017) 60 tablet 0  . ENSURE (ENSURE) Take 237 mLs by mouth 4 (four) times daily. Reported on 02/08/2016    . furosemide (LASIX) 40 MG tablet Take 1 tablet (40 mg total) by mouth 2 (two) times daily with breakfast and lunch. 60 tablet 0  . gabapentin (NEURONTIN) 300 MG capsule Take 1 capsule (300 mg total) by mouth 2 (two) times daily. 60 capsule 11  . HUMALOG KWIKPEN 100 UNIT/ML KiwkPen Inject 24 Units into the skin 3 (three) times daily before meals.  6  . HYDROcodone-homatropine (HYDROMET) 5-1.5 MG/5ML syrup Take 5 mLs by mouth every 4 (four) hours as needed. (Patient taking differently: Take 5 mLs by mouth every 4 (four) hours as  needed for cough. ) 240 mL 0  . hydrocortisone (CORTEF) 5 MG tablet Take 5 mg by mouth at bedtime.     . insulin degludec (TRESIBA FLEXTOUCH) 100 UNIT/ML SOPN FlexTouch Pen Inject 70 Units into the skin every morning.     Marland Kitchen ketoconazole (NIZORAL) 2 % cream Apply 1 application topically 2 (two) times daily as needed for irritation (between bikini lines).    . losartan (COZAAR) 50 MG tablet Take 0.5 tablets (25 mg total) by mouth at bedtime. 15 tablet 0  . ofloxacin (OCUFLOX) 0.3 % ophthalmic solution Place 1 drop into the left eye 3 (three) times daily. Started 10-15-17    . oxyCODONE-acetaminophen (PERCOCET) 10-325 MG tablet Take 1 tablet by mouth every 6 (six) hours as needed for pain. 10 tablet 0  . phenazopyridine (AZO-TABS) 95 MG tablet Take 95 mg by mouth 3 (three) times daily as needed for pain.    Marland Kitchen Potassium Chloride ER 20 MEQ TBCR Take 20 mEq by mouth daily. 30 tablet 0  . pramipexole (MIRAPEX) 1 MG tablet TAKE 5mg =5 TABLETS BY MOUTH daily at bedtime (Patient taking differently: Take 5 mg by mouth  at bedtime. TAKE 5mg =5 TABLETS BY MOUTH daily at bedtime) 540 tablet 1  . silver sulfADIAZINE (SILVADENE) 1 % cream Apply 1 application topically daily. (Patient taking differently: Apply 1 application topically See admin instructions. 1 application applied to affected sites 1-2 times a day) 50 g 0  . zolpidem (AMBIEN) 10 MG tablet TAKE 1 TABLET BY MOUTH AT BEDTIME (Patient taking differently: Take 10 mg by mouth at bedtime) 30 tablet 5   No current facility-administered medications on file prior to visit.     ALLERGIES: Allergies  Allergen Reactions  . Lyrica [Pregabalin] Other (See Comments)    Suicidal   . Aspirin Other (See Comments)    Abdominal pain  . Ciprofloxacin Nausea And Vomiting    Made pt very sick on stomach  . Clarithromycin Other (See Comments)    Made stomach "burn"  . Doxycycline Itching  . Erythromycin Nausea And Vomiting  . Lisinopril Cough  . Macrobid  [Nitrofurantoin Monohyd Macro] Nausea And Vomiting  . Metolazone Other (See Comments)    Pt stated this made her B/P drop  . Other Nausea And Vomiting    Pt cannot have any of the -mycin(s)  . Penicillins Swelling    From childhood: swelling @ injection site Has patient had a PCN reaction causing immediate rash, facial/tongue/throat swelling, SOB or lightheadedness with hypotension: Yes Has patient had a PCN reaction causing severe rash involving mucus membranes or skin necrosis: No Has patient had a PCN reaction that required hospitalization: No Has patient had a PCN reaction occurring within the last 10 years: No If all of the above answers are "NO", then may proceed with Cephalosporin use.   . Sulfonamide Derivatives Nausea And Vomiting  . Valtrex [Valacyclovir Hcl] Nausea Only    FAMILY HISTORY: Family History  Problem Relation Age of Onset  . Lupus Mother   . COPD Father     SOCIAL HISTORY: Social History   Socioeconomic History  . Marital status: Widowed    Spouse name: Not on file  . Number of children: Not on file  . Years of education: Not on file  . Highest education level: Not on file  Social Needs  . Financial resource strain: Not on file  . Food insecurity - worry: Not on file  . Food insecurity - inability: Not on file  . Transportation needs - medical: Not on file  . Transportation needs - non-medical: Not on file  Occupational History  . Not on file  Tobacco Use  . Smoking status: Never Smoker  . Smokeless tobacco: Never Used  Substance and Sexual Activity  . Alcohol use: No    Alcohol/week: 0.0 oz  . Drug use: No  . Sexual activity: No    Birth control/protection: Surgical  Other Topics Concern  . Not on file  Social History Narrative  . Not on file    REVIEW OF SYSTEMS: Constitutional: No fevers, chills, or sweats, + generalized fatigue,no change in appetite Eyes: No visual changes, double vision, eye pain Ear, nose and throat: No hearing  loss, ear pain, nasal congestion, sore throat Cardiovascular: No chest pain, palpitations Respiratory:  + shortness of breath at rest or with exertion, no wheezes GastrointestinaI: No nausea, vomiting, diarrhea, abdominal pain, fecal incontinence Genitourinary:  No dysuria, urinary retention or frequency Musculoskeletal:  + neck pain, back pain Integumentary: No rash, pruritus, skin lesions Neurological: as above Psychiatric: + depression, insomnia, anxiety Endocrine: No palpitations, fatigue, diaphoresis, mood swings, change in appetite, change in weight,  increased thirst Hematologic/Lymphatic:  No anemia, purpura, petechiae. Allergic/Immunologic: no itchy/runny eyes, nasal congestion, recent allergic reactions, rashes  PHYSICAL EXAM: Vitals:   10/24/17 0840  BP: (!) 112/54  Pulse: 87  SpO2: 97%   General: No acute distress, flat affect, tired-appearing Head:  Normocephalic/atraumatic Neck: supple, + paraspinal tenderness, full range of motion Heart:  Regular rate and rhythm Lungs:  Clear to auscultation bilaterally Back: No paraspinal tenderness Skin/Extremities: No rash, +bipedal edema Neurological Exam: alert and oriented to person, place, and time. No aphasia or dysarthria. Fund of knowledge is appropriate. Recent and remote memory are intact. 3/3 delayed recall. Attention and concentration are normal. Able to name objects and repeat phrases. Cranial nerves: Pupils equal, round, reactive to light.Extraocular movements intact with no nystagmus. Visual fields full. Facial sensation intact. No facial asymmetry. Tongue, uvula, palate midline. Motor: Bulk and tone normal, muscle strength 5/5 throughout with no pronator drift. Decreased sensation in a stocking distribution to ankles bilaterally to cold, pin, vibration sense (similar to prior exam). Deep Tendon Reflexes: +2 both UE, unable to elicit bilateral patella and ankle jerks. no ankle clonus. Plantar responses: downgoing  bilaterally. Cerebellar: no incoordination on finger to nose Gait: slow and cautious with cane, no ataxia. Romberg mild sway. Tremor: none  IMPRESSION: This is an 84 yo RH woman with a history of hypertension, diabetes, RLS, initially seen a year ago for dizziness, headaches, memory loss, and episodes of loss of consciousness. No further episodes of loss of consciousness. She presents today after a stroke last November 8657 with embolic-appearing small infarcts in multiple vascular distributions, concerning for cardioembolic event. Echo showed severely dilated left atrium. She has a loop recorder, so far no atrial fibrillation reported. She was discharged on Plavix but has not been taking it, this will be prescribed today for secondary stroke prevention. She reports allergy to aspirin. We also discussed the importance of control of vascular risk factors, she has been having difficulty with glucose control. She is concerned about memory but does not want help from her son at home. She does not drive. She was recently admitted for CHF and continues to report fatigue, shortness of breath, with bipedal edema. She has a follow-up with PCP next week and was advised to follow-up with Cardiology. She will follow-up in 6 months and knows to go to the ER immediately for any sudden changes in symptoms.  Thank you for allowing me to participate in her care.  Please do not hesitate to call for any questions or concerns.  The duration of this appointment visit was 25 minutes of face-to-face time with the patient.  Greater than 50% of this time was spent in counseling, explanation of diagnosis, planning of further management, and coordination of care.   Ellouise Newer, M.D.   CC: Dr. Sarajane Jews

## 2017-10-25 NOTE — Telephone Encounter (Signed)
Called and spoke with [place]. [place] advised and the OK verbal order were given for the pt.

## 2017-10-25 NOTE — Telephone Encounter (Signed)
Please renew the PT orders

## 2017-10-26 ENCOUNTER — Ambulatory Visit: Payer: Medicare Other | Admitting: Podiatry

## 2017-10-26 ENCOUNTER — Encounter: Payer: Self-pay | Admitting: Podiatry

## 2017-10-26 ENCOUNTER — Telehealth: Payer: Self-pay

## 2017-10-26 DIAGNOSIS — S81801D Unspecified open wound, right lower leg, subsequent encounter: Secondary | ICD-10-CM | POA: Diagnosis not present

## 2017-10-26 DIAGNOSIS — L97519 Non-pressure chronic ulcer of other part of right foot with unspecified severity: Secondary | ICD-10-CM

## 2017-10-26 DIAGNOSIS — E11621 Type 2 diabetes mellitus with foot ulcer: Secondary | ICD-10-CM | POA: Diagnosis not present

## 2017-10-26 NOTE — Progress Notes (Signed)
  Subjective:  Patient ID: Melanie Mcdonald, female    DOB: 1932/11/08,  MRN: [id number]  Chief Complaint  Patient presents with  . Foot Ulcer    Follow up hallux and lower leg right   "Its been doing pretty good-been in the hospital again though"   84 y.o. female returns for wound care. Believes the wound to be doing pretty well.  States that she was in the hospital recently for congestive heart failure.  States home health care is no longer coming due to recent hospitalization. Denies N/V/F/Ch.  Objective:  There were no vitals filed for this visit. General AA&O x3. Normal mood and affect.  Vascular Foot warm to touch.  Neurologic Sensation grossly diminished.  Dermatologic (Wound) Wound Location: Right leg Wound Measurement: 2 x 2 x 0.1 Wound Base: Mixed Granular/Fibrotic Peri-wound: Normal Exudate: None: wound tissue dry Wound progress: Improved since last check.  Right hallux ulceration PIPJ appears healed with thin hyperkeratosis  Orthopedic: No pain to palpation either foot.   Assessment & Plan:  Patient was evaluated and treated and all questions answered.  Wound right leg -Debridement as below. -Dressed with meduihoney, DSD.  Procedure: Selective Debridement of Wound Rationale: Removal of devitalized tissue from the wound to promote healing.  Pre-Debridement Wound Measurements: 2 cm x 2 cm x 0.1 cm  Post-Debridement Wound Measurements: same as pre-debridement. Type of Debridement: Selective Tissue Removed: Devitalized soft-tissue Instrumentation: 3-0 mm dermal curette Dressing: Dry, sterile, compression dressing. Disposition: Patient tolerated procedure well. Patient to return in 1 week for follow-up.  Right hallux ulceration -Appears healed.  Return in about 2 weeks (around 11/09/2017) for Wound Care.

## 2017-10-26 NOTE — Telephone Encounter (Signed)
Sent to PCP ?

## 2017-10-26 NOTE — Telephone Encounter (Signed)
Copied from [place] [id number]. Topic: Inquiry >> Oct 26, 2017  4:06 PM Oliver Pila B wrote: Reason for CRM: Flonnie Hailstone occupational therapist from [facility]  called to state the pt has had 5lbs increase in 2 days, pt states did not take lasix yesterday Contact [phone] if needed

## 2017-10-26 NOTE — Telephone Encounter (Signed)
Tell her to take the Lasix as directed every single day

## 2017-10-29 ENCOUNTER — Encounter: Payer: Self-pay | Admitting: Family Medicine

## 2017-10-29 ENCOUNTER — Ambulatory Visit: Payer: Medicare Other | Admitting: Family Medicine

## 2017-10-29 VITALS — BP 98/58 | HR 82 | Temp 97.8°F | Wt 234.2 lb

## 2017-10-29 DIAGNOSIS — E1165 Type 2 diabetes mellitus with hyperglycemia: Secondary | ICD-10-CM

## 2017-10-29 DIAGNOSIS — G459 Transient cerebral ischemic attack, unspecified: Secondary | ICD-10-CM

## 2017-10-29 DIAGNOSIS — R531 Weakness: Secondary | ICD-10-CM

## 2017-10-29 DIAGNOSIS — I10 Essential (primary) hypertension: Secondary | ICD-10-CM

## 2017-10-29 DIAGNOSIS — Z794 Long term (current) use of insulin: Secondary | ICD-10-CM

## 2017-10-29 DIAGNOSIS — I5033 Acute on chronic diastolic (congestive) heart failure: Secondary | ICD-10-CM

## 2017-10-29 DIAGNOSIS — M7989 Other specified soft tissue disorders: Secondary | ICD-10-CM

## 2017-10-29 LAB — CUP PACEART REMOTE DEVICE CHECK
Date Time Interrogation Session: 20190120213947
Implantable Pulse Generator Implant Date: 20181022

## 2017-10-29 NOTE — Telephone Encounter (Signed)
Please this in

## 2017-10-29 NOTE — Telephone Encounter (Signed)
Called and spoke with Remo Lipps and gave her the OK for the verbal orders that were requested.   Error on last message wrote Addaleigh ment to write Remo Lipps.

## 2017-10-29 NOTE — Telephone Encounter (Signed)
[place] with [facility] is requesting verbal orders for occupational therapy twice a week for 5 weeks  Sent to PCP for approval

## 2017-10-29 NOTE — Progress Notes (Signed)
   Subjective:    Patient ID: Melanie Mcdonald, female    DOB: 02-07-1933, 84 y.o.   MRN: [id number]  HPI Here to follow up a hospital stay from 10-18-17 to 10-21-17 for acute on chronic diastolic heart failure., and then a visit with Dr. Delice Lesch about multiple tiny embolic strokes. Her BP had heart failure have stabilized due to the addition of Carvedilol to her regimen and the fact that she is taking her Lasix regularly. She admitted to being non-compliant with this on admission. She still gets SOB with minimal exertion but she can get around much better now. No chest pain. Dr. Delice Lesch started her on Plavix also. Her leg swelling has improved a bit and she assured me she is taking the Lasix as directed.    Review of Systems  Constitutional: Negative.   Respiratory: Positive for shortness of breath. Negative for cough.   Cardiovascular: Positive for leg swelling. Negative for chest pain and palpitations.  Neurological: Positive for dizziness. Negative for tremors, seizures, speech difficulty, weakness, light-headedness, numbness and headaches.       Objective:   Physical Exam  Constitutional: She is oriented to person, place, and time.  Alert, walking with her walker   Cardiovascular: Normal rate, regular rhythm, normal heart sounds and intact distal pulses.  Pulmonary/Chest: Effort normal and breath sounds normal. No respiratory distress. She has no wheezes. She has no rales.  Musculoskeletal:  4+ edema in both lower legs   Neurological: She is alert and oriented to person, place, and time. No cranial nerve deficit.          Assessment & Plan:  Her HTN and CHF are stable. She will take Carvedilol and Lasix and Losartan. Refer to Cardiology to help manage. Her diabetes seems to be stable. She will start on Plavix to prevent further embolic strokes.  Alysia Penna, MD

## 2017-10-29 NOTE — Telephone Encounter (Signed)
Called and spoke with Remo Lipps. Remo Lipps advised and voiced understanding will address this with [name].

## 2017-10-30 ENCOUNTER — Encounter: Payer: Self-pay | Admitting: Neurology

## 2017-11-05 ENCOUNTER — Encounter: Payer: Self-pay | Admitting: Cardiology

## 2017-11-05 ENCOUNTER — Telehealth: Payer: Self-pay | Admitting: Family Medicine

## 2017-11-05 ENCOUNTER — Ambulatory Visit: Payer: Medicare Other | Admitting: Cardiology

## 2017-11-05 VITALS — BP 138/58 | HR 84 | Ht 69.0 in | Wt 236.0 lb

## 2017-11-05 DIAGNOSIS — I5033 Acute on chronic diastolic (congestive) heart failure: Secondary | ICD-10-CM | POA: Diagnosis not present

## 2017-11-05 DIAGNOSIS — I2510 Atherosclerotic heart disease of native coronary artery without angina pectoris: Secondary | ICD-10-CM

## 2017-11-05 DIAGNOSIS — I739 Peripheral vascular disease, unspecified: Secondary | ICD-10-CM | POA: Diagnosis not present

## 2017-11-05 DIAGNOSIS — I10 Essential (primary) hypertension: Secondary | ICD-10-CM | POA: Diagnosis not present

## 2017-11-05 MED ORDER — SPIRONOLACTONE 25 MG PO TABS: 25.0000 mg | ORAL_TABLET | Freq: Every day | ORAL | 1 refills | Status: DC

## 2017-11-05 NOTE — Telephone Encounter (Signed)
Noted  

## 2017-11-05 NOTE — Telephone Encounter (Signed)
Copied from [place] [id number]. Topic: Quick Communication - See Telephone Encounter >> Nov 05, 2017  9:02 AM Aurelio Brash B wrote: CRM for notification. See Telephone encounter for:  [name] PT from [facility] called to give update on pt.   Her Weight was 228lb today,   all vs fine- BP = 138/78 - 70 heart rate  - 97% 02   reports she forgot to take lasix yesterday,  pt seeing  cardiologist this afternoon   11/05/17.   [name] can be reached at [phone]

## 2017-11-05 NOTE — Patient Instructions (Signed)
Medication Instructions:   START TAKING SPIRONOLACTONE 25 MG ONCE DAILY     Labwork:  IN 3 WEEKS--SAME DAY AS YOU SEE DR NELSON IN CLINIC--TO CHECK--BMET AND PRO-BNP     Testing/Procedures:  Your physician has requested that you have a lower extremity arterial duplex. This test is an ultrasound of the arteries in the legs or arms. It looks at arterial blood flow in the legs and arms. Allow one hour for Lower and Upper Arterial scans. There are no restrictions or special instructions     Follow-Up:  3 WEEKS WITH DR NELSON--ADD TO ONE OF DR NELSON'S END SLOTS ON HER QUARTER DAY--PLEASE HAVE LABS DONE SAME DAY.       If you need a refill on your cardiac medications before your next appointment, please call your pharmacy.

## 2017-11-05 NOTE — Telephone Encounter (Signed)
Sent to PCP as an FYI  

## 2017-11-05 NOTE — Progress Notes (Signed)
Cardiology Office Note:    Date:  11/07/2017   ID:  Melanie Mcdonald, DOB 12/28/1932, MRN [id number]  PCP:  Laurey Morale, MD  Cardiologist:  No primary care provider on file.   Referring MD: Laurey Morale, MD   History of Present Illness:    Melanie Mcdonald is a 84 y.o. female with a hx of hypertension, hyperlipidemia, obesity, narcolepsy, multiple TIAs, prior cardiac catheterization with no intervention and negative stress test in 2012. Patient states that she has been profoundly fatigued and short of breath. 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 Patient appears very tired and in fact falls asleep twice during conversation and appears significantly short of breath just walking a few steps. She hasn't had pulmonary function test or chest CT. She underwent echocardiographic in March of this year that showed preserved left ventricular ejection fraction, no comment about diastolic function, mild mitral stenosis and no comment about right-sided pressures. She states that she always feels dizzy and she has been worked up for bradycardia in the past during surgery day heart monitor but is not sure what was decided based on the results. There is somedate and that her heart rate went as low as 40s and there was consideration about placement of permanent pacemaker. The patient states that she since she last saw Dr. Gwenlyn Found she has had stable daily dizziness but no syncope. She doesn't want to follow with Dr. Gwenlyn Found. The patient states that she has a history of asthma but she's not using any inhalers.  11/03/2014 - the patient is coming after 6 months she feels the same significant fatigue and shortness of breath with minimal exertion. No syncope or palpitations. No chest pain. She admits that she feels like she's taking a lot of medicines and  since she had syncopal episode after being started multiple medicines she only takes medicines as needed. She complains of significant lower extremity edema.  08/12/2015 - patient is coming after 6 months, she states that she was not taking metolazone as she wouldn't be able to leave house, she is trying to take lasix while at home. Occasional PND and orthopnea, chronic LE edema. She has been dealing with falls, no blackouts, but loss of balance, she fell in her shower yesterday. She has chronic headaches, worse since yesterday. No chest pain or palpitations, no loss of consciousness.  11/05/17 - the patient continues to feel tired, she is experiencing LE pain while walking and at rest. She was able to stop using some of her meds including narcotics. She is able to walk short distances without chest pain. She has been recently started on lasix for LE edema. Denies orthopnea or PND.   Past Medical History:  Diagnosis Date  . ANEMIA 01/30/2008  . ARTHRITIS 12/11/2007  . BACK PAIN, CHRONIC 06/23/2008  . BREAST CYST, RIGHT 12/17/2007  . Cancer ([place])   . CHF (congestive heart failure) ([place])   . Colon polyps    FRAGMENTS OF HYPERPLASTIC POLYP  . CONTACT DERMATITIS 03/10/2009  . COPD (chronic obstructive pulmonary disease) ([place])   . CORONARY ARTERY DISEASE 03/01/2007   had a normal Myoview stress test 07-13-11  . CYSTOCELE WITHOUT MENTION UTERINE PROLAPSE LAT 09/12/2010  . DEGENERATIVE JOINT DISEASE 08/07/2007  . DEPRESSION 03/01/2007  . DIABETES MELLITUS, TYPE II 08/26/2008   sees Dr. Buddy Duty   .  Diverticulosis of colon (without mention of hemorrhage)   . Esophageal reflux 11/06/2007  . HYPERKERATOSIS 06/02/2009  . HYPERLIPIDEMIA 08/26/2008  . HYPERTENSION 03/01/2007  . Irritable bowel syndrome 01/26/2009  . LABYRINTHITIS 11/03/2009  . 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 NEPHROLITHIASIS 04/29/2009  . OBESITY 03/01/2007  . PERIPHERAL VASCULAR DISEASE 01/26/2009  . Stroke ([place])   . SYNCOPE  08/26/2008   had brain MRI on 06-28-12 showing only chronic microvascular ischemia and atrophy   . TRANSIENT ISCHEMIC ATTACK 10/20/2009   had normal brain MRA with patent vertebrals and carotids 06-28-12    Past Surgical History:  Procedure Laterality Date  . ABDOMINAL HYSTERECTOMY    . BACK SURGERY     x2  . CARDIAC CATHETERIZATION  07/2009  . CATARACT EXTRACTION    . COLONOSCOPY  11-13-07   per Dr. Deatra Ina, benign polyps, repeat in 5 yrs  . ESOPHAGOGASTRODUODENOSCOPY  11-13-07   per Dr. Deatra Ina, normal   . INTRAOCULAR LENS INSERTION    . LOOP RECORDER INSERTION N/A 07/23/2017   Procedure: LOOP RECORDER INSERTION;  Surgeon: [facility] Lance, MD;  Location: [place] [facility];  Service: Cardiovascular;  Laterality: N/A;  . SPINE SURGERY     x 3  . TONSILECTOMY, ADENOIDECTOMY, BILATERAL MYRINGOTOMY AND TUBES      Current Medications: Current Meds  Medication Sig  . carvedilol (COREG) 3.125 MG tablet Take 1 tablet (3.125 mg total) by mouth 2 (two) times daily with a meal.  . clopidogrel (PLAVIX) 75 MG tablet Take 1 tablet (75 mg total) by mouth daily.  Marland Kitchen ENSURE (ENSURE) Take 237 mLs by mouth 4 (four) times daily. Reported on 02/08/2016  . furosemide (LASIX) 40 MG tablet Take 1 tablet (40 mg total) by mouth 2 (two) times daily with breakfast and lunch.  . gabapentin (NEURONTIN) 300 MG capsule Take 1 capsule (300 mg total) by mouth 2 (two) times daily.  Marland Kitchen HUMALOG KWIKPEN 100 UNIT/ML KiwkPen Inject 24 Units into the skin 3 (three) times daily before meals.  . hydrocortisone (CORTEF) 5 MG tablet Take 5 mg by mouth at bedtime.   . insulin degludec (TRESIBA FLEXTOUCH) 100 UNIT/ML SOPN FlexTouch Pen Inject 70 Units into the skin every morning.   Marland Kitchen ketoconazole (NIZORAL) 2 % cream Apply 1 application topically 2 (two) times daily as needed for irritation (between bikini lines).  . losartan (COZAAR) 50 MG tablet Take 0.5 tablets (25 mg total) by mouth at bedtime.  Marland Kitchen oxyCODONE-acetaminophen  (PERCOCET) 10-325 MG tablet Take 1 tablet by mouth every 6 (six) hours as needed for pain.  Marland Kitchen Potassium Chloride ER 20 MEQ TBCR Take 20 mEq by mouth daily.  Marland Kitchen zolpidem (AMBIEN) 10 MG tablet TAKE 1 TABLET BY MOUTH AT BEDTIME (Patient taking differently: Take 10 mg by mouth at bedtime)     Allergies:   Lyrica [pregabalin]; Aspirin; Ciprofloxacin; Clarithromycin; Doxycycline; Erythromycin; Lisinopril; Macrobid [nitrofurantoin monohyd macro]; Metolazone; Other; Penicillins; Sulfonamide derivatives; and Valtrex [valacyclovir hcl]   Social History   Socioeconomic History  . Marital status: Widowed    Spouse name: None  . Number of children: None  . Years of education: None  . Highest education level: None  Social Needs  . Financial resource strain: None  . Food insecurity - worry: None  . Food insecurity - inability: None  . Transportation needs - medical: None  . Transportation needs - non-medical: None  Occupational History  . None  Tobacco Use  . Smoking status: Never Smoker  . Smokeless tobacco:  Never Used  Substance and Sexual Activity  . Alcohol use: No    Alcohol/week: 0.0 oz  . Drug use: No  . Sexual activity: No    Birth control/protection: Surgical  Other Topics Concern  . None  Social History Narrative  . None     Family History: The patient's family history includes COPD in her father; Lupus in her mother.  ROS:   Please see the history of present illness.    All other systems reviewed and are negative.  EKGs/Labs/Other Studies Reviewed:    The following studies were reviewed today:   EKG:  EKG is not ordered today.    Recent Labs: 03/30/2017: TSH 0.94 10/18/2017: ALT 13; B Natriuretic Peptide 174.9; Hemoglobin 11.9; Platelets 233 10/21/2017: BUN 26; Creatinine, Ser 0.66; Potassium 4.0; Sodium 140  Recent Lipid Panel    Component Value Date/Time   CHOL 109 07/20/2017 0450   CHOL 160 12/21/2014 1153   TRIG 136 07/20/2017 0450   TRIG 62 12/21/2014 1153     HDL 40 (L) 07/20/2017 0450   HDL 74 12/21/2014 1153   CHOLHDL 2.7 07/20/2017 0450   VLDL 27 07/20/2017 0450   LDLCALC 42 07/20/2017 0450   LDLCALC 74 12/21/2014 1153    Physical Exam:    VS:  BP (!) 138/58   Pulse 84   Ht 5\' 9"  (1.753 m)   Wt 236 lb (107 kg)   BMI 34.85 kg/m     Wt Readings from Last 3 Encounters:  11/05/17 236 lb (107 kg)  10/29/17 234 lb 3.2 oz (106.2 kg)  10/24/17 225 lb (102.1 kg)     GEN: Well nourished, well developed in no acute distress appears tired HEENT: Normal NECK: No JVD; No carotid bruits LYMPHATICS: No lymphadenopathy CARDIAC: RRR, no murmurs, rubs, gallops RESPIRATORY:  Clear to auscultation without rales, wheezing or rhonchi  ABDOMEN: Soft, non-tender, non-distended MUSCULOSKELETAL:  Chronic B/L  Edema up to the knees; pulses not felt on LE B/L  SKIN: Warm and dry NEUROLOGIC:  Alert and oriented x 3 PSYCHIATRIC:  Normal affect   ASSESSMENT:    1. Claudication ([place])   2. Atherosclerosis of native coronary artery of native heart without angina pectoris   3. Essential hypertension   4. Acute on chronic diastolic CHF (congestive heart failure), NYHA class 3 (HCC)    PLAN:    In order of problems listed above:  1. Acute and chronic diastolic CHF - continue lasix 40 mg po daily, add spironolactone 25 mg po daily, elevate her lower extremities and use compression stockings as much as possible.  Follow up in 3 weeks with BMP and BNP.  2. DOE- sec to uncontrolled HTN -  Lexiscan nuclear stress test was negative - CT/CTA chest to assess for parenchymal lung disease and chronic thromboembolic disease that were ruled out.There was no significant finding on her chest CT but finding of thyroid nodule, that was biopsied and showed benign nodule. - started Advair 250/50 BID and albuterol PRN, however normal pulmonary function test was normal  3. Hypertension with severe concentric LVH on echocardiogram, BP now controlled  4.  Claudications - order B/L LE Duplex  Medication Adjustments/Labs and Tests Ordered: Current medicines are reviewed at length with the patient today.  Concerns regarding medicines are outlined above.  Orders Placed This Encounter  Procedures  . Basic Metabolic Panel (BMET)  . Pro b natriuretic peptide   Meds ordered this encounter  Medications  . spironolactone (ALDACTONE) 25 MG tablet  Sig: Take 1 tablet (25 mg total) by mouth daily.    Dispense:  90 tablet    Refill:  1    Signed, Ena Dawley, MD  11/07/2017 9:42 PM    [place]

## 2017-11-07 ENCOUNTER — Ambulatory Visit: Payer: Medicare Other | Admitting: Podiatry

## 2017-11-09 ENCOUNTER — Other Ambulatory Visit: Payer: Self-pay | Admitting: Cardiology

## 2017-11-09 ENCOUNTER — Ambulatory Visit: Payer: Medicare Other | Admitting: Podiatry

## 2017-11-09 DIAGNOSIS — I739 Peripheral vascular disease, unspecified: Secondary | ICD-10-CM

## 2017-11-13 ENCOUNTER — Ambulatory Visit (HOSPITAL_COMMUNITY)
Admission: RE | Admit: 2017-11-13 | Discharge: 2017-11-13 | Disposition: A | Discharge status: Home / Self Care | Payer: Medicare Other | Source: Ambulatory Visit | Attending: Internal Medicine | Admitting: Internal Medicine

## 2017-11-13 DIAGNOSIS — I2510 Atherosclerotic heart disease of native coronary artery without angina pectoris: Secondary | ICD-10-CM | POA: Insufficient documentation

## 2017-11-13 DIAGNOSIS — R9389 Abnormal findings on diagnostic imaging of other specified body structures: Secondary | ICD-10-CM | POA: Insufficient documentation

## 2017-11-13 DIAGNOSIS — I10 Essential (primary) hypertension: Secondary | ICD-10-CM | POA: Insufficient documentation

## 2017-11-13 DIAGNOSIS — Z794 Long term (current) use of insulin: Secondary | ICD-10-CM | POA: Diagnosis not present

## 2017-11-13 DIAGNOSIS — I739 Peripheral vascular disease, unspecified: Secondary | ICD-10-CM

## 2017-11-13 DIAGNOSIS — E1151 Type 2 diabetes mellitus with diabetic peripheral angiopathy without gangrene: Secondary | ICD-10-CM | POA: Insufficient documentation

## 2017-11-14 ENCOUNTER — Ambulatory Visit: Payer: Medicare Other | Admitting: Podiatry

## 2017-11-14 DIAGNOSIS — S81801D Unspecified open wound, right lower leg, subsequent encounter: Secondary | ICD-10-CM | POA: Diagnosis not present

## 2017-11-15 ENCOUNTER — Telehealth: Payer: Self-pay | Admitting: Cardiology

## 2017-11-15 NOTE — Progress Notes (Signed)
  Subjective:  Patient ID: Melanie Mcdonald, female    DOB: Dec 03, 1932,  MRN: [id number]  Chief Complaint  Patient presents with  . Wound Check    Right lower leg wound open and draining   84 y.o. female returns for wound care.  States that the right lower leg wound was previously doing well until yesterday where she had noninvasive vascular studies done.  States that the blood pressure cuff was placed in the right leg and after this the wound started to hurt and drain and bleed.  Objective:  There were no vitals filed for this visit. General AA&O x3. Normal mood and affect.  Vascular Foot warm to touch.  Neurologic Sensation grossly diminished.  Dermatologic (Wound) Wound Location: Right lateral leg Wound Measurement: 1.5 x 1.5 x 0.1 Wound Base: Mixed Granular/Fibrotic Peri-wound: Normal Exudate: Scant/small amount Serous exudate  Wound progress: Improved since last check.  Orthopedic: No pain to palpation either foot.   Assessment & Plan:  Patient was evaluated and treated and all questions answered.  Ulcer right leg -Debridement as below. -Dressed with medihoney, DSD. -Continue home health care.  Procedure: Selective Debridement of Wound Rationale: Removal of devitalized tissue from the wound to promote healing.  Pre-Debridement Wound Measurements: 1.5 cm x 1.5 cm x 0.1 cm  Post-Debridement Wound Measurements: same as pre-debridement. Type of Debridement: Selective Tissue Removed: Devitalized soft-tissue Instrumentation: 3-0 mm dermal curette Dressing: Dry, sterile, compression dressing. Disposition: Patient tolerated procedure well. Patient to return in 2 weeks for follow-up.  Return in about 2 weeks (around 11/28/2017) for Wound Care.

## 2017-11-15 NOTE — Telephone Encounter (Signed)
New message  Pt verbalized that she is calling because she want to   Be released from [facility] and to go to [facility]  She said that Dr.Berry took care of her late husband   Is this okay?

## 2017-11-15 NOTE — Telephone Encounter (Signed)
Ok with me 

## 2017-11-16 ENCOUNTER — Telehealth: Payer: Self-pay | Admitting: Family Medicine

## 2017-11-16 NOTE — Telephone Encounter (Signed)
Fine with me

## 2017-11-16 NOTE — Telephone Encounter (Signed)
Copied from [place]. Topic: General - Other >> Nov 16, 2017 11:54 AM Yvette Rack wrote: Reason for CRM: Nurse Clementeen Hoof from [facility] [phone] calling to let Dr Sarajane Jews know that the patient was let out of the hospital on 10-21-17 Nurse [name] would like a copy of patients med list faxed to [phone] attn Clementeen Hoof

## 2017-11-16 NOTE — Telephone Encounter (Signed)
Yes please send them a med list

## 2017-11-16 NOTE — Telephone Encounter (Signed)
OK to send records? Please advise.

## 2017-11-20 ENCOUNTER — Ambulatory Visit (INDEPENDENT_AMBULATORY_CARE_PROVIDER_SITE_OTHER): Payer: Medicare Other | Admitting: *Deleted

## 2017-11-20 ENCOUNTER — Telehealth: Payer: Self-pay | Admitting: *Deleted

## 2017-11-20 DIAGNOSIS — I6340 Cerebral infarction due to embolism of unspecified cerebral artery: Secondary | ICD-10-CM | POA: Diagnosis not present

## 2017-11-20 DIAGNOSIS — E782 Mixed hyperlipidemia: Secondary | ICD-10-CM

## 2017-11-20 NOTE — Telephone Encounter (Signed)
-----   Message from Dorothy Spark, MD sent at 11/19/2017  5:38 PM EST ----- Yes, thank you!!  ----- Message ----- From: Nuala Alpha, LPN Sent: 6/62/9476  11:25 AM To: Dorothy Spark, MD  Dr. Meda Coffee, she has not had lipids checked at her PCP Dr. Barbie Banner office.  She is already coming in on 12/06/17 for repeat BMET and PRO-BNP.  You want me to ADD lipids to that to get a baseline?   ----- Message ----- From: Dorothy Spark, MD Sent: 11/19/2017  10:51 AM To: Nuala Alpha, LPN  Please check or obtain her most recnt lipids ffrom her PCP.

## 2017-11-20 NOTE — Telephone Encounter (Signed)
Per Dr Meda Coffee we will check the pts lipids on 3/7, same day as previous scheduled labs, and appt with Dr Meda Coffee.  Pt is aware to come fasting to this lab appt.  Pt verbalized understanding and agrees with this plan.

## 2017-11-21 ENCOUNTER — Telehealth: Payer: Self-pay | Admitting: Cardiology

## 2017-11-21 ENCOUNTER — Ambulatory Visit: Payer: Self-pay | Admitting: *Deleted

## 2017-11-21 NOTE — Telephone Encounter (Signed)
Pt has home PT; PTA called to report pt stated she had an episode of "speech difficulty"  Monday night. Witnessed by pt's son; lasted 30 minutes. States "could not find words" No other symptoms. Per PTA, pt now at baseline. Pt reports has had 5 episodes over past year. Has h/o TIAs "tiny embolic strokes." Pt states she does not feel need to be seen. Spoke with [facility]  'Sunday Spillers'; made aware of situation. Instructed to call back if symptoms reoccur.  Reason for Disposition . [1] Loss of speech or garbled speech AND [2] is a chronic symptom (recurrent or ongoing AND present > 4 weeks)  Answer Assessment - Initial Assessment Questions 1. SYMPTOM: "What is the main symptom you are concerned about?" (e.g., weakness, numbness)     Speech difficulty 2 nights ago per PTA report 2. ONSET: "When did this start?" (minutes, hours, days; while sleeping)     Wednesday night, lasted 30 minutes 3. LAST NORMAL: "When was the last time you were normal (no symptoms)?"     H/O TIAs...embolic infarcts 4. PATTERN "Does this come and go, or has it been constant since it started?"  "Is it present now?"    States 5 episodes in past year 5. CARDIAC SYMPTOMS: "Have you had any of the following symptoms: chest pain, difficulty breathing, palpitations?"     no 6. NEUROLOGIC SYMPTOMS: "Have you had any of the following symptoms: headache, dizziness, vision loss, double vision, changes in speech, unsteady on your feet?"     Speech  "Couldn't find any words." 7. OTHER SYMPTOMS: "Do you have any other symptoms?"     no  Protocols used: NEUROLOGIC DEFICIT-A-AH

## 2017-11-21 NOTE — Telephone Encounter (Signed)
This has been done faxed pt's medication list to requested fax number: [phone] to Nurse [facility]

## 2017-11-21 NOTE — Telephone Encounter (Signed)
Spoke w/ pt and requested that she send a manual transmission b/c her home monitor has not updated in at least 14 days.   

## 2017-11-22 NOTE — Progress Notes (Signed)
[facility] Summary Report / Loop Recorder 

## 2017-11-23 NOTE — Telephone Encounter (Signed)
Noted. She is seen by Neurology for this. Recheck prn.

## 2017-11-28 ENCOUNTER — Ambulatory Visit: Payer: Medicare Other | Admitting: Podiatry

## 2017-12-03 ENCOUNTER — Telehealth: Payer: Self-pay | Admitting: Family Medicine

## 2017-12-03 NOTE — Telephone Encounter (Signed)
Sent to CP as an FYI and to advise if needed.

## 2017-12-03 NOTE — Telephone Encounter (Unsigned)
Copied from [place] [phone]. Topic: Quick Communication - See Telephone Encounter >> Dec 03, 2017 12:34 PM Hewitt Shorts wrote: CRM for notification. See Telephone encounter for: 12/03/17.Olivia Mackie from [facility] is calling to let the office know that pt had a fall on Saturday and was downstairs for almost 7 hours before someone could help her up and that she did have some right elbow pain but other than that she was just sore-she did state that she tripped over something with this fall but does get  light headed    Best number [phone]

## 2017-12-04 ENCOUNTER — Encounter: Payer: Self-pay | Admitting: Cardiovascular Disease

## 2017-12-04 ENCOUNTER — Ambulatory Visit: Payer: Medicare Other | Admitting: Cardiovascular Disease

## 2017-12-04 DIAGNOSIS — Z8673 Personal history of transient ischemic attack (TIA), and cerebral infarction without residual deficits: Secondary | ICD-10-CM

## 2017-12-04 DIAGNOSIS — I5033 Acute on chronic diastolic (congestive) heart failure: Secondary | ICD-10-CM

## 2017-12-04 DIAGNOSIS — E7800 Pure hypercholesterolemia, unspecified: Secondary | ICD-10-CM

## 2017-12-04 DIAGNOSIS — I10 Essential (primary) hypertension: Secondary | ICD-10-CM

## 2017-12-04 NOTE — Assessment & Plan Note (Signed)
History of TIAs in the past status post loop recorder implantation by Dr. Lovena Le who follows this (07/23/17) for question of "cryptogenic stroke" related to PAF.

## 2017-12-04 NOTE — Assessment & Plan Note (Signed)
History of hyperlipidemia not on statin therapy with lipid profile performed 07/20/17 revealing total cholesterol 109, LDL 42 and HDL of 40

## 2017-12-04 NOTE — Progress Notes (Signed)
12/04/2017 Melanie Mcdonald   Jun 02, 1933  [id number]  Primary Physician Laurey Morale, MD Primary Cardiologist: Lorretta Harp MD Garret Reddish, [place], [place]  HPI:  Melanie Mcdonald is a 84 y.o. moderately overweight widowed Caucasian female whose husband Melanie Mcdonald was also a patient of mine who passed away  over 10 years ago. She is the mother of one living child. She was a patient of Dr. Francesca Oman  but wishes to transfer to my care because I was her husband's cardiologist years ago. She has a history of hypertension diastolic heart failure. She's had TIAs and has had a loop recorder implanted by Dr. Lovena Le in October. She was complaining of claudication although recent Doppler showed normal ABIs were triphasic waveforms.   Current Meds  Medication Sig  . carvedilol (COREG) 3.125 MG tablet Take 1 tablet (3.125 mg total) by mouth 2 (two) times daily with a meal.  . clopidogrel (PLAVIX) 75 MG tablet Take 1 tablet (75 mg total) by mouth daily.  Marland Kitchen ENSURE (ENSURE) Take 237 mLs by mouth 4 (four) times daily. Reported on 02/08/2016  . furosemide (LASIX) 40 MG tablet Take 1 tablet (40 mg total) by mouth 2 (two) times daily with breakfast and lunch.  . gabapentin (NEURONTIN) 300 MG capsule Take 1 capsule (300 mg total) by mouth 2 (two) times daily.  Marland Kitchen HUMALOG KWIKPEN 100 UNIT/ML KiwkPen Inject 24 Units into the skin 3 (three) times daily before meals.  . hydrocortisone (CORTEF) 5 MG tablet Take 5 mg by mouth at bedtime.   . insulin degludec (TRESIBA FLEXTOUCH) 100 UNIT/ML SOPN FlexTouch Pen Inject 70 Units into the skin every morning.   Marland Kitchen ketoconazole (NIZORAL) 2 % cream Apply 1 application topically 2 (two) times daily as needed for irritation (between bikini lines).  . Potassium Chloride ER 20 MEQ TBCR Take 20 mEq by mouth daily.  Marland Kitchen spironolactone (ALDACTONE) 25 MG tablet Take 1 tablet (25 mg total) by mouth daily.  Marland Kitchen zolpidem (AMBIEN) 10 MG tablet TAKE 1 TABLET BY MOUTH AT BEDTIME (Patient taking  differently: Take 10 mg by mouth at bedtime)     Allergies  Allergen Reactions  . Lyrica [Pregabalin] Other (See Comments)    Suicidal   . Aspirin Other (See Comments)    Abdominal pain  . Ciprofloxacin Nausea And Vomiting    Made pt very sick on stomach  . Clarithromycin Other (See Comments)    Made stomach "burn"  . Doxycycline Itching  . Erythromycin Nausea And Vomiting  . Lisinopril Cough  . Macrobid [Nitrofurantoin Monohyd Macro] Nausea And Vomiting  . Metolazone Other (See Comments)    Pt stated this made her B/P drop  . Other Nausea And Vomiting    Pt cannot have any of the -mycin(s)  . Penicillins Swelling    From childhood: swelling @ injection site Has patient had a PCN reaction causing immediate rash, facial/tongue/throat swelling, SOB or lightheadedness with hypotension: Yes Has patient had a PCN reaction causing severe rash involving mucus membranes or skin necrosis: No Has patient had a PCN reaction that required hospitalization: No Has patient had a PCN reaction occurring within the last 10 years: No If all of the above answers are "NO", then may proceed with Cephalosporin use.   . Sulfonamide Derivatives Nausea And Vomiting  . Valtrex [Valacyclovir Hcl] Nausea Only    Social History   Socioeconomic History  . Marital status: Widowed    Spouse name: Not on file  .  Number of children: Not on file  . Years of education: Not on file  . Highest education level: Not on file  Social Needs  . Financial resource strain: Not on file  . Food insecurity - worry: Not on file  . Food insecurity - inability: Not on file  . Transportation needs - medical: Not on file  . Transportation needs - non-medical: Not on file  Occupational History  . Not on file  Tobacco Use  . Smoking status: Never Smoker  . Smokeless tobacco: Never Used  Substance and Sexual Activity  . Alcohol use: No    Alcohol/week: 0.0 oz  . Drug use: No  . Sexual activity: No    Birth  control/protection: Surgical  Other Topics Concern  . Not on file  Social History Narrative  . Not on file     Review of Systems: General: negative for chills, fever, night sweats or weight changes.  Cardiovascular: negative for chest pain, dyspnea on exertion, edema, orthopnea, palpitations, paroxysmal nocturnal dyspnea or shortness of breath Dermatological: negative for rash Respiratory: negative for cough or wheezing Urologic: negative for hematuria Abdominal: negative for nausea, vomiting, diarrhea, bright red blood per rectum, melena, or hematemesis Neurologic: negative for visual changes, syncope, or dizziness All other systems reviewed and are otherwise negative except as noted above.    Blood pressure (!) 145/70, pulse 84, height 5\' 9"  (1.753 m), weight 234 lb (106.1 kg).  General appearance: alert and no distress Neck: no adenopathy, no carotid bruit, no JVD, supple, symmetrical, trachea midline and thyroid not enlarged, symmetric, no tenderness/mass/nodules Lungs: clear to auscultation bilaterally Heart: regular rate and rhythm, S1, S2 normal, no murmur, click, rub or gallop Extremities: extremities normal, atraumatic, no cyanosis or edema Pulses: 2+ and symmetric Skin: Skin color, texture, turgor normal. No rashes or lesions Neurologic: Alert and oriented X 3, normal strength and tone. Normal symmetric reflexes. Normal coordination and gait  EKG not performed today  ASSESSMENT AND PLAN:   Hyperlipidemia History of hyperlipidemia not on statin therapy with lipid profile performed 07/20/17 revealing total cholesterol 109, LDL 42 and HDL of 40  Essential hypertension History of essential hypertension with blood pressure measured 138/58. She is on carvedilol and losartan. Continue current meds at current dosing  Chest pain History of atypical chest pain with Myoview performed 04/07/14 that was nonischemic.  Acute on chronic diastolic CHF (congestive heart failure), NYHA  class 3 (HCC) History of acute on chronic diastolic heart failure on diuretics and aware of salt restriction currently with very little peripheral edema  History of TIA (transient ischemic attack) History of TIAs in the past status post loop recorder implantation by Dr. Lovena Le who follows this (07/23/17) for question of "cryptogenic stroke" related to PAF.      Lorretta Harp MD FACP,FACC,FAHA, [facility] 12/04/2017 12:07 PM

## 2017-12-04 NOTE — Patient Instructions (Signed)

## 2017-12-04 NOTE — Assessment & Plan Note (Signed)
History of acute on chronic diastolic heart failure on diuretics and aware of salt restriction currently with very little peripheral edema

## 2017-12-04 NOTE — Assessment & Plan Note (Signed)
History of essential hypertension with blood pressure measured 138/58. She is on carvedilol and losartan. Continue current meds at current dosing

## 2017-12-04 NOTE — Assessment & Plan Note (Signed)
History of atypical chest pain with Myoview performed 04/07/14 that was nonischemic.

## 2017-12-05 ENCOUNTER — Ambulatory Visit: Payer: Medicare Other | Admitting: Podiatry

## 2017-12-05 DIAGNOSIS — S81801D Unspecified open wound, right lower leg, subsequent encounter: Secondary | ICD-10-CM

## 2017-12-06 ENCOUNTER — Other Ambulatory Visit: Payer: Medicare Other

## 2017-12-06 ENCOUNTER — Ambulatory Visit: Payer: Medicare Other | Admitting: Cardiology

## 2017-12-11 ENCOUNTER — Encounter: Payer: Self-pay | Admitting: Physician Assistant

## 2017-12-11 ENCOUNTER — Ambulatory Visit: Payer: Medicare Other | Admitting: Physician Assistant

## 2017-12-11 ENCOUNTER — Other Ambulatory Visit: Payer: Self-pay | Admitting: Cardiology

## 2017-12-11 ENCOUNTER — Other Ambulatory Visit: Payer: Medicare Other

## 2017-12-11 VITALS — BP 136/78 | HR 84 | Ht 68.0 in | Wt 235.8 lb

## 2017-12-11 DIAGNOSIS — I5032 Chronic diastolic (congestive) heart failure: Secondary | ICD-10-CM | POA: Diagnosis not present

## 2017-12-11 DIAGNOSIS — I10 Essential (primary) hypertension: Secondary | ICD-10-CM

## 2017-12-11 DIAGNOSIS — I2510 Atherosclerotic heart disease of native coronary artery without angina pectoris: Secondary | ICD-10-CM

## 2017-12-11 DIAGNOSIS — I739 Peripheral vascular disease, unspecified: Secondary | ICD-10-CM

## 2017-12-11 DIAGNOSIS — G458 Other transient cerebral ischemic attacks and related syndromes: Secondary | ICD-10-CM | POA: Diagnosis not present

## 2017-12-11 DIAGNOSIS — I5033 Acute on chronic diastolic (congestive) heart failure: Secondary | ICD-10-CM

## 2017-12-11 DIAGNOSIS — E782 Mixed hyperlipidemia: Secondary | ICD-10-CM

## 2017-12-11 LAB — LIPID PANEL
Chol/HDL Ratio: 2.6 ratio (ref 0.0–4.4)
Cholesterol, Total: 125 mg/dL (ref 100–199)
HDL: 49 mg/dL (ref >39)
LDL Calculated: 56 mg/dL (ref 0–99)
Triglycerides: 98 mg/dL (ref 0–149)
VLDL Cholesterol Cal: 20 mg/dL (ref 5–40)

## 2017-12-11 LAB — BASIC METABOLIC PANEL
BUN/Creatinine Ratio: 35 — ABNORMAL HIGH (ref 12–28)
BUN: 28 mg/dL — ABNORMAL HIGH (ref 8–27)
CO2: 26 mmol/L (ref 20–29)
Calcium: 9.5 mg/dL (ref 8.7–10.3)
Chloride: 103 mmol/L (ref 96–106)
Creatinine, Ser: 0.81 mg/dL (ref 0.57–1.00)
GFR calc Af Amer: 78 mL/min/{1.73_m2} (ref >59)
GFR calc non Af Amer: 68 mL/min/{1.73_m2} (ref >59)
Glucose: 60 mg/dL — ABNORMAL LOW (ref 65–99)
Potassium: 4.3 mmol/L (ref 3.5–5.2)
Sodium: 142 mmol/L (ref 134–144)

## 2017-12-11 LAB — PRO B NATRIURETIC PEPTIDE: NT-Pro BNP: 159 pg/mL (ref 0–738)

## 2017-12-11 NOTE — Progress Notes (Signed)
Cardiology Office Note    Date:  12/11/2017   ID:  Melanie Mcdonald, DOB 04-22-1933, MRN [id number]  PCP:  Laurey Morale, MD  Cardiologist: Ena Dawley, MD  Chief Complaint  Patient presents with  . Follow-up    History of Present Illness:  Melanie Mcdonald is a 84 y.o. female with history of chronic diastolic CHF, dyspnea on exertion felt secondary to uncontrolled hypertension, TIAs with loop recorder implanted by Dr. Lovena Le in October.  Prior [place] -2015, hypertension, claudication symptoms with normal ABIs.  Last echo 07/2017 normal LVEF 60-65% with moderate LVH and grade 1 DD, left atrium severely dilated.  CTA chest 10/18/17 coronary atherosclerosis main pulmonary artery suggestive of chronic pulmonary artery hypertension.  See below for details.  Patient saw Dr. Meda Coffee 11/05/17 and Spironolactone 25 mg was added for lower extremity edema.  Also compression hose were added.  Patient then transferred care to Dr. Gwenlyn Found since he treated her husband but now she prefers to stay with Dr. Meda Coffee.  She has chronic dyspnea on exertion and leg edema.  She has not noticed any change in her leg swelling since addition of Spironolactone.  She tries to follow a low-sodium diet but does add salt to the meat she cooks and says she drinks a lot of milk.  She can get compression hose on because she has an open wound on her right leg at home health is treating   Past Medical History:  Diagnosis Date  . ANEMIA 01/30/2008  . ARTHRITIS 12/11/2007  . BACK PAIN, CHRONIC 06/23/2008  . BREAST CYST, RIGHT 12/17/2007  . Cancer ([place])   . CHF (congestive heart failure) ([place])   . Colon polyps    FRAGMENTS OF HYPERPLASTIC POLYP  . CONTACT DERMATITIS 03/10/2009  . COPD (chronic obstructive pulmonary disease) ([place])   . CORONARY ARTERY DISEASE 03/01/2007   had a normal Myoview stress test 07-13-11  . CYSTOCELE WITHOUT MENTION UTERINE PROLAPSE LAT 09/12/2010  . DEGENERATIVE JOINT DISEASE 08/07/2007  . DEPRESSION  03/01/2007  . DIABETES MELLITUS, TYPE II 08/26/2008   sees Dr. Buddy Duty   . Diverticulosis of colon (without mention of hemorrhage)   . Esophageal reflux 11/06/2007  . HYPERKERATOSIS 06/02/2009  . HYPERLIPIDEMIA 08/26/2008  . HYPERTENSION 03/01/2007  . Irritable bowel syndrome 01/26/2009  . LABYRINTHITIS 11/03/2009  . 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 NEPHROLITHIASIS 04/29/2009  . OBESITY 03/01/2007  . PERIPHERAL VASCULAR DISEASE 01/26/2009  . Stroke ([place])   . SYNCOPE 08/26/2008   had brain MRI on 06-28-12 showing only chronic microvascular ischemia and atrophy   . TRANSIENT ISCHEMIC ATTACK 10/20/2009   had normal brain MRA with patent vertebrals and carotids 06-28-12    Past Surgical History:  Procedure Laterality Date  . ABDOMINAL HYSTERECTOMY    . BACK SURGERY     x2  . CARDIAC CATHETERIZATION  07/2009  . CATARACT EXTRACTION    . COLONOSCOPY  11-13-07   per Dr. Deatra Ina, benign polyps, repeat in 5 yrs  . ESOPHAGOGASTRODUODENOSCOPY  11-13-07   per Dr. Deatra Ina, normal   . INTRAOCULAR LENS INSERTION    . LOOP RECORDER INSERTION N/A 07/23/2017   Procedure: LOOP RECORDER INSERTION;  Surgeon: Evans Lance, MD;  Location: [facility];  Service: Cardiovascular;  Laterality: N/A;  . SPINE SURGERY     x 3  . TONSILECTOMY, ADENOIDECTOMY, BILATERAL MYRINGOTOMY AND TUBES      Current Medications: Current Meds  Medication Sig  . carvedilol (COREG) 3.125 MG tablet  Take 3.125 mg by mouth 2 (two) times daily with a meal.  . clopidogrel (PLAVIX) 75 MG tablet Take 1 tablet (75 mg total) by mouth daily.  Marland Kitchen ENSURE (ENSURE) Take 237 mLs by mouth 4 (four) times daily. Reported on 02/08/2016  . furosemide (LASIX) 40 MG tablet Take 1 tablet (40 mg total) by mouth 2 (two) times daily with breakfast and lunch.  . gabapentin (NEURONTIN) 300 MG capsule Take 1 capsule (300 mg total) by mouth 2 (two) times daily.  Marland Kitchen HUMALOG KWIKPEN 100 UNIT/ML KiwkPen Inject 24 Units into the skin 3 (three)  times daily before meals.  . hydrocortisone (CORTEF) 5 MG tablet Take 5 mg by mouth at bedtime.   . insulin degludec (TRESIBA FLEXTOUCH) 100 UNIT/ML SOPN FlexTouch Pen Inject 70 Units into the skin every morning.   Marland Kitchen ketoconazole (NIZORAL) 2 % cream Apply 1 application topically 2 (two) times daily as needed for irritation (between bikini lines).  . LORazepam (ATIVAN) 1 MG tablet Take 1 mg by mouth every 6 (six) hours as needed for anxiety.  Marland Kitchen losartan (COZAAR) 50 MG tablet Take 25 mg by mouth daily.  . Potassium Chloride ER 20 MEQ TBCR Take 20 mEq by mouth daily.  . pramipexole (MIRAPEX) 1 MG tablet Take 5 mg by mouth at bedtime.  Marland Kitchen spironolactone (ALDACTONE) 25 MG tablet Take 1 tablet (25 mg total) by mouth daily.  Marland Kitchen zolpidem (AMBIEN) 10 MG tablet TAKE 1 TABLET BY MOUTH AT BEDTIME (Patient taking differently: Take 10 mg by mouth at bedtime)     Allergies:   Lyrica [pregabalin]; Aspirin; Ciprofloxacin; Clarithromycin; Doxycycline; Erythromycin; Lisinopril; Macrobid [nitrofurantoin monohyd macro]; Metolazone; Other; Penicillins; Sulfonamide derivatives; and Valtrex [valacyclovir hcl]   Social History   Socioeconomic History  . Marital status: Widowed    Spouse name: None  . Number of children: None  . Years of education: None  . Highest education level: None  Social Needs  . Financial resource strain: None  . Food insecurity - worry: None  . Food insecurity - inability: None  . Transportation needs - medical: None  . Transportation needs - non-medical: None  Occupational History  . None  Tobacco Use  . Smoking status: Never Smoker  . Smokeless tobacco: Never Used  Substance and Sexual Activity  . Alcohol use: No    Alcohol/week: 0.0 oz  . Drug use: No  . Sexual activity: No    Birth control/protection: Surgical  Other Topics Concern  . None  Social History Narrative  . None     Family History:  The patient's family history includes COPD in her father; Lupus in her mother.    ROS:   Please see the history of present illness.    Review of Systems  Constitution: Positive for malaise/fatigue.  HENT: Negative.   Eyes: Negative.   Cardiovascular: Positive for dyspnea on exertion and leg swelling.  Respiratory: Negative.   Hematologic/Lymphatic: Bruises/bleeds easily.  Musculoskeletal: Negative.  Negative for joint pain.  Gastrointestinal: Negative.   Genitourinary: Negative.   Neurological: Positive for loss of balance.   All other systems reviewed and are negative.   PHYSICAL EXAM:   VS:  BP 136/78   Pulse 84   Ht 5\' 8"  (1.727 m)   Wt 235 lb 12.8 oz (107 kg)   SpO2 98%   BMI 35.85 kg/m   Physical Exam  GEN: Obese, in no acute distress  Neck: no JVD, carotid bruits, or masses Cardiac:RRR; no murmurs, rubs, or gallops  Respiratory: Decreased breath sounds but clear to auscultation bilaterally, normal work of breathing GI: soft, nontender, nondistended, + BS Ext: +2-3 edema right greater than left decreased distal pulses bilaterally Neuro:  Alert and Oriented x 3 Psych: euthymic mood, full affect  Wt Readings from Last 3 Encounters:  12/11/17 235 lb 12.8 oz (107 kg)  12/04/17 234 lb (106.1 kg)  11/05/17 236 lb (107 kg)      Studies/Labs Reviewed:   EKG:  EKG is not ordered today.   Recent Labs: 03/30/2017: TSH 0.94 10/18/2017: ALT 13; B Natriuretic Peptide 174.9; Hemoglobin 11.9; Platelets 233 10/21/2017: BUN 26; Creatinine, Ser 0.66; Potassium 4.0; Sodium 140   Lipid Panel    Component Value Date/Time   CHOL 109 07/20/2017 0450   CHOL 160 12/21/2014 1153   TRIG 136 07/20/2017 0450   TRIG 62 12/21/2014 1153   HDL 40 (L) 07/20/2017 0450   HDL 74 12/21/2014 1153   CHOLHDL 2.7 07/20/2017 0450   VLDL 27 07/20/2017 0450   LDLCALC 42 07/20/2017 0450   LDLCALC 74 12/21/2014 1153    Additional studies/ records that were reviewed today include:  2D echo 07/21/17 Study Conclusions   - Left ventricle: The cavity size was normal. There  was moderate   concentric hypertrophy. Systolic function was normal. The   estimated ejection fraction was in the range of 60% to 65%. Wall   motion was normal; there were no regional wall motion   abnormalities. Doppler parameters are consistent with abnormal   left ventricular relaxation (grade 1 diastolic dysfunction).   Doppler parameters are consistent with high ventricular filling   pressure. - Aortic valve: Transvalvular velocity was within the normal range.   There was no stenosis. There was no regurgitation. Valve area   (Vmax): 1.68 cm^2. - Mitral valve: Severely calcified annulus. The findings are   consistent with moderate stenosis. There was no regurgitation.   Valve area by pressure half-time: 1.95 cm^2. Valve area by   continuity equation (using LVOT flow): 0.6 cm^2. - Left atrium: The atrium was severely dilated. - Right ventricle: The cavity size was normal. Wall thickness was   normal. Systolic function was normal. - Atrial septum: No defect or patent foramen ovale was identified. - Tricuspid valve: There was trivial regurgitation. - Pulmonary arteries: Systolic pressure was within the normal   range. PA peak pressure: 34 mm Hg (S).   Arterial Dopplers 2/2019Final Interpretation: Right: Resting right ankle-brachial index is within normal range. No evidence of significant right lower extremity arterial disease. The right toe-brachial index is normal.  Left: Resting left ankle-brachial index is within normal range. No evidence of significant left lower extremity arterial disease. The left toe-brachial index is abnormal.   Chest CT 10/18/17  IMPRESSION: 1. No pulmonary embolism. 2. Dilated main pulmonary artery, stable, suggesting chronic pulmonary arterial hypertension. 3. Mild interlobular septal thickening throughout both lungs, which may indicate mild pulmonary edema. 4. New mild mediastinal and bilateral hilar adenopathy, nonspecific. No follow-up is required  unless as otherwise clinically warranted. This recommendation follows ACR consensus guidelines: Managing Incidental Findings on Thoracic CT: Mediastinal and Cardiovascular Findings. A White Paper of the ACR Incidental Findings Committee. J Am Coll Radiol. 2018; 15: 1027-2536. 5. Coronary atherosclerosis. 6. Small hiatal hernia.   Aortic Atherosclerosis (ICD10-I70.0).     Electronically Signed   By: Ilona Sorrel M.D.   On: 10/18/2017 19:48      ASSESSMENT:    1. Atherosclerosis of native coronary artery of native heart  without angina pectoris   2. Chronic diastolic CHF (congestive heart failure) ([place])   3. Essential hypertension   4. Other specified transient cerebral ischemias      PLAN:  In order of problems listed above:  Coronary atherosclerosis on CT.  No symptoms of angina.  Negative Lexiscan in 3500  Chronic diastolic CHF with predominantly lower extremity edema no improvement with addition of spironolactone.  Check labs today and make a decision on increasing medications.  2 g sodium diet reiterated.  Follow-up with Dr. Meda Coffee in 3-4 months  Essential hypertension blood pressure well controlled  History of TIA/CVA on Plavix with loop recorder no evidence of A. fib yet    Medication Adjustments/Labs and Tests Ordered: Current medicines are reviewed at length with the patient today.  Concerns regarding medicines are outlined above.  Medication changes, Labs and Tests ordered today are listed in the Patient Instructions below. Patient Instructions  Medication Instructions:  Your physician recommends that you continue on your current medications as directed. Please refer to the Current Medication list given to you today.   Labwork: LIPID, PRO BNP, & BMP  Testing/Procedures: None ordered  Follow-Up: Your physician recommends that you schedule a follow-up appointment in: 3-4 MONTHS WITH DR. Meda Coffee   Any Other Special Instructions Will Be Listed Below (If  Applicable).  DASH Eating Plan DASH stands for "Dietary Approaches to Stop Hypertension." The DASH eating plan is a healthy eating plan that has been shown to reduce high blood pressure (hypertension). It may also reduce your risk for type 2 diabetes, heart disease, and stroke. The DASH eating plan may also help with weight loss. What are tips for following this plan? General guidelines  Avoid eating more than 2,300 mg (milligrams) of salt (sodium) a day. If you have hypertension, you may need to reduce your sodium intake to 2,000 mg a day.  Limit alcohol intake to no more than 1 drink a day for nonpregnant women and 2 drinks a day for men. One drink equals 12 oz of beer, 5 oz of wine, or 1 oz of hard liquor.  Work with your health care provider to maintain a healthy body weight or to lose weight. Ask what an ideal weight is for you.  Get at least 30 minutes of exercise that causes your heart to beat faster (aerobic exercise) most days of the week. Activities may include walking, swimming, or biking.  Work with your health care provider or diet and nutrition specialist (dietitian) to adjust your eating plan to your individual calorie needs. Reading food labels  Check food labels for the amount of sodium per serving. Choose foods with less than 5 percent of the Daily Value of sodium. Generally, foods with less than 300 mg of sodium per serving fit into this eating plan.  To find whole grains, look for the word "whole" as the first word in the ingredient list. Shopping  Buy products labeled as "low-sodium" or "no salt added."  Buy fresh foods. Avoid canned foods and premade or frozen meals. Cooking  Avoid adding salt when cooking. Use salt-free seasonings or herbs instead of table salt or sea salt. Check with your health care provider or pharmacist before using salt substitutes.  Do not fry foods. Cook foods using healthy methods such as baking, boiling, grilling, and broiling  instead.  Cook with heart-healthy oils, such as olive, canola, soybean, or sunflower oil. Meal planning   Eat a balanced diet that includes: ? 5 or more servings of  fruits and vegetables each day. At each meal, try to fill half of your plate with fruits and vegetables. ? Up to 6-8 servings of whole grains each day. ? Less than 6 oz of lean meat, poultry, or fish each day. A 3-oz serving of meat is about the same size as a deck of cards. One egg equals 1 oz. ? 2 servings of low-fat dairy each day. ? A serving of nuts, seeds, or beans 5 times each week. ? Heart-healthy fats. Healthy fats called Omega-3 fatty acids are found in foods such as flaxseeds and coldwater fish, like sardines, salmon, and mackerel.  Limit how much you eat of the following: ? Canned or prepackaged foods. ? Food that is high in trans fat, such as fried foods. ? Food that is high in saturated fat, such as fatty meat. ? Sweets, desserts, sugary drinks, and other foods with added sugar. ? Full-fat dairy products.  Do not salt foods before eating.  Try to eat at least 2 vegetarian meals each week.  Eat more home-cooked food and less restaurant, buffet, and fast food.  When eating at a restaurant, ask that your food be prepared with less salt or no salt, if possible. What foods are recommended? The items listed may not be a complete list. Talk with your dietitian about what dietary choices are best for you. Grains Whole-grain or whole-wheat bread. Whole-grain or whole-wheat pasta. Brown rice. Modena Morrow. Bulgur. Whole-grain and low-sodium cereals. Pita bread. Low-fat, low-sodium crackers. Whole-wheat flour tortillas. Vegetables Fresh or frozen vegetables (raw, steamed, roasted, or grilled). Low-sodium or reduced-sodium tomato and vegetable juice. Low-sodium or reduced-sodium tomato sauce and tomato paste. Low-sodium or reduced-sodium canned vegetables. Fruits All fresh, dried, or frozen fruit. Canned fruit in  natural juice (without added sugar). Meat and other protein foods Skinless chicken or [place]. Ground chicken or [place]. Pork with fat trimmed off. Fish and seafood. Egg whites. Dried beans, peas, or lentils. Unsalted nuts, nut butters, and seeds. Unsalted canned beans. Lean cuts of beef with fat trimmed off. Low-sodium, lean deli meat. Dairy Low-fat (1%) or fat-free (skim) milk. Fat-free, low-fat, or reduced-fat cheeses. Nonfat, low-sodium ricotta or cottage cheese. Low-fat or nonfat yogurt. Low-fat, low-sodium cheese. Fats and oils Soft margarine without trans fats. Vegetable oil. Low-fat, reduced-fat, or light mayonnaise and salad dressings (reduced-sodium). Canola, safflower, olive, soybean, and sunflower oils. Avocado. Seasoning and other foods Herbs. Spices. Seasoning mixes without salt. Unsalted popcorn and pretzels. Fat-free sweets. What foods are not recommended? The items listed may not be a complete list. Talk with your dietitian about what dietary choices are best for you. Grains Baked goods made with fat, such as croissants, muffins, or some breads. Dry pasta or rice meal packs. Vegetables Creamed or fried vegetables. Vegetables in a cheese sauce. Regular canned vegetables (not low-sodium or reduced-sodium). Regular canned tomato sauce and paste (not low-sodium or reduced-sodium). Regular tomato and vegetable juice (not low-sodium or reduced-sodium). Angie Fava. Olives. Fruits Canned fruit in a light or heavy syrup. Fried fruit. Fruit in cream or butter sauce. Meat and other protein foods Fatty cuts of meat. Ribs. Fried meat. Berniece Salines. Sausage. Bologna and other processed lunch meats. Salami. Fatback. Hotdogs. Bratwurst. Salted nuts and seeds. Canned beans with added salt. Canned or smoked fish. Whole eggs or egg yolks. Chicken or [place] with skin. Dairy Whole or 2% milk, cream, and half-and-half. Whole or full-fat cream cheese. Whole-fat or sweetened yogurt. Full-fat cheese. Nondairy  creamers. Whipped toppings. Processed cheese and cheese spreads. Fats  and oils Butter. Stick margarine. Lard. Shortening. Ghee. Bacon fat. Tropical oils, such as coconut, palm kernel, or palm oil. Seasoning and other foods Salted popcorn and pretzels. Onion salt, garlic salt, seasoned salt, table salt, and sea salt. Worcestershire sauce. Tartar sauce. Barbecue sauce. Teriyaki sauce. Soy sauce, including reduced-sodium. Steak sauce. Canned and packaged gravies. Fish sauce. Oyster sauce. Cocktail sauce. Horseradish that you find on the shelf. Ketchup. Mustard. Meat flavorings and tenderizers. Bouillon cubes. Hot sauce and Tabasco sauce. Premade or packaged marinades. Premade or packaged taco seasonings. Relishes. Regular salad dressings. Where to find more information:  National Heart, Lung, and [place]: https://wilson-eaton.com/  American Heart Association: www.heart.org Summary  The DASH eating plan is a healthy eating plan that has been shown to reduce high blood pressure (hypertension). It may also reduce your risk for type 2 diabetes, heart disease, and stroke.  With the DASH eating plan, you should limit salt (sodium) intake to 2,300 mg a day. If you have hypertension, you may need to reduce your sodium intake to 1,500 mg a day.  When on the DASH eating plan, aim to eat more fresh fruits and vegetables, whole grains, lean proteins, low-fat dairy, and heart-healthy fats.  Work with your health care provider or diet and nutrition specialist (dietitian) to adjust your eating plan to your individual calorie needs. This information is not intended to replace advice given to you by your health care provider. Make sure you discuss any questions you have with your health care provider. Document Released: 09/07/2011 Document Revised: 09/11/2016 Document Reviewed: 09/11/2016 Elsevier Interactive Patient Education  [employer].     If you need a refill on your cardiac medications before  your next appointment, please call your pharmacy.      Sumner Boast, PA-C  12/11/2017 10:56 AM    [facility] [address], [place], [place]  [address] Phone: [phone]; Fax: [phone]

## 2017-12-11 NOTE — Progress Notes (Signed)
  Subjective:  Patient ID: Melanie Mcdonald, female    DOB: 06-03-1933,  MRN: [id number]  No chief complaint on file.  84 y.o. female returns for wound care.  Believes the wound is improving.  Reports that it is tender.  Reports daily dressing change.  Objective:  There were no vitals filed for this visit. General AA&O x3. Normal mood and affect.  Vascular Foot warm to touch.  Neurologic Sensation grossly diminished.  Dermatologic (Wound) Wound Location: Right lateral leg Wound Measurement: 1.5x1x0.1 Wound Base: Mixed Granular/Fibrotic Peri-wound: Normal Exudate: Scant/small amount Serous exudate  Wound progress: improved since last check.  Orthopedic: No pain to palpation either foot.   Assessment & Plan:  Patient was evaluated and treated and all questions answered.  Ulcer right leg -Debridement as below. -Dressed with medihoney, DSD. -Continue home health care.  Procedure: Selective Debridement of Wound Rationale: Removal of devitalized tissue from the wound to promote healing.  Pre-Debridement Wound Measurements: 1.5 cm x 1 cm x 0.1 cm  Post-Debridement Wound Measurements: same as pre-debridement. Type of Debridement: Selective Tissue Removed: Devitalized soft-tissue Instrumentation: 3-0 mm dermal curette Dressing: Dry, sterile, compression dressing. Disposition: Patient tolerated procedure well. Patient to return in 1 week for follow-up.   No Follow-up on file.

## 2017-12-11 NOTE — Patient Instructions (Addendum)
Medication Instructions:  Your physician recommends that you continue on your current medications as directed. Please refer to the Current Medication list given to you today.   Labwork: LIPID, PRO BNP, & BMP  Testing/Procedures: None ordered  Follow-Up: Your physician recommends that you schedule a follow-up appointment in: 3-4 MONTHS WITH DR. Meda Coffee   Any Other Special Instructions Will Be Listed Below (If Applicable).  DASH Eating Plan DASH stands for "Dietary Approaches to Stop Hypertension." The DASH eating plan is a healthy eating plan that has been shown to reduce high blood pressure (hypertension). It may also reduce your risk for type 2 diabetes, heart disease, and stroke. The DASH eating plan may also help with weight loss. What are tips for following this plan? General guidelines  Avoid eating more than 2,300 mg (milligrams) of salt (sodium) a day. If you have hypertension, you may need to reduce your sodium intake to 2,000 mg a day.  Limit alcohol intake to no more than 1 drink a day for nonpregnant women and 2 drinks a day for men. One drink equals 12 oz of beer, 5 oz of wine, or 1 oz of hard liquor.  Work with your health care provider to maintain a healthy body weight or to lose weight. Ask what an ideal weight is for you.  Get at least 30 minutes of exercise that causes your heart to beat faster (aerobic exercise) most days of the week. Activities may include walking, swimming, or biking.  Work with your health care provider or diet and nutrition specialist (dietitian) to adjust your eating plan to your individual calorie needs. Reading food labels  Check food labels for the amount of sodium per serving. Choose foods with less than 5 percent of the Daily Value of sodium. Generally, foods with less than 300 mg of sodium per serving fit into this eating plan.  To find whole grains, look for the word "whole" as the first word in the ingredient list. Shopping  Buy  products labeled as "low-sodium" or "no salt added."  Buy fresh foods. Avoid canned foods and premade or frozen meals. Cooking  Avoid adding salt when cooking. Use salt-free seasonings or herbs instead of table salt or sea salt. Check with your health care provider or pharmacist before using salt substitutes.  Do not fry foods. Cook foods using healthy methods such as baking, boiling, grilling, and broiling instead.  Cook with heart-healthy oils, such as olive, canola, soybean, or sunflower oil. Meal planning   Eat a balanced diet that includes: ? 5 or more servings of fruits and vegetables each day. At each meal, try to fill half of your plate with fruits and vegetables. ? Up to 6-8 servings of whole grains each day. ? Less than 6 oz of lean meat, poultry, or fish each day. A 3-oz serving of meat is about the same size as a deck of cards. One egg equals 1 oz. ? 2 servings of low-fat dairy each day. ? A serving of nuts, seeds, or beans 5 times each week. ? Heart-healthy fats. Healthy fats called Omega-3 fatty acids are found in foods such as flaxseeds and coldwater fish, like sardines, salmon, and mackerel.  Limit how much you eat of the following: ? Canned or prepackaged foods. ? Food that is high in trans fat, such as fried foods. ? Food that is high in saturated fat, such as fatty meat. ? Sweets, desserts, sugary drinks, and other foods with added sugar. ? Full-fat dairy products.  Do not salt foods before eating.  Try to eat at least 2 vegetarian meals each week.  Eat more home-cooked food and less restaurant, buffet, and fast food.  When eating at a restaurant, ask that your food be prepared with less salt or no salt, if possible. What foods are recommended? The items listed may not be a complete list. Talk with your dietitian about what dietary choices are best for you. Grains Whole-grain or whole-wheat bread. Whole-grain or whole-wheat pasta. Brown rice. [place].  Bulgur. Whole-grain and low-sodium cereals. Pita bread. Low-fat, low-sodium crackers. Whole-wheat flour tortillas. Vegetables Fresh or frozen vegetables (raw, steamed, roasted, or grilled). Low-sodium or reduced-sodium tomato and vegetable juice. Low-sodium or reduced-sodium tomato sauce and tomato paste. Low-sodium or reduced-sodium canned vegetables. Fruits All fresh, dried, or frozen fruit. Canned fruit in natural juice (without added sugar). Meat and other protein foods Skinless chicken or [place]. Ground chicken or [place]. Pork with fat trimmed off. Fish and seafood. Egg whites. Dried beans, peas, or lentils. Unsalted nuts, nut butters, and seeds. Unsalted canned beans. Lean cuts of beef with fat trimmed off. Low-sodium, lean deli meat. Dairy Low-fat (1%) or fat-free (skim) milk. Fat-free, low-fat, or reduced-fat cheeses. Nonfat, low-sodium ricotta or cottage cheese. Low-fat or nonfat yogurt. Low-fat, low-sodium cheese. Fats and oils Soft margarine without trans fats. Vegetable oil. Low-fat, reduced-fat, or light mayonnaise and salad dressings (reduced-sodium). Canola, safflower, olive, soybean, and sunflower oils. Avocado. Seasoning and other foods Herbs. Spices. Seasoning mixes without salt. Unsalted popcorn and pretzels. Fat-free sweets. What foods are not recommended? The items listed may not be a complete list. Talk with your dietitian about what dietary choices are best for you. Grains Baked goods made with fat, such as croissants, muffins, or some breads. Dry pasta or rice meal packs. Vegetables Creamed or fried vegetables. Vegetables in a cheese sauce. Regular canned vegetables (not low-sodium or reduced-sodium). Regular canned tomato sauce and paste (not low-sodium or reduced-sodium). Regular tomato and vegetable juice (not low-sodium or reduced-sodium). Angie Fava. Olives. Fruits Canned fruit in a light or heavy syrup. Fried fruit. Fruit in cream or butter sauce. Meat and other  protein foods Fatty cuts of meat. Ribs. Fried meat. Berniece Salines. Sausage. Bologna and other processed lunch meats. Salami. Fatback. Hotdogs. Bratwurst. Salted nuts and seeds. Canned beans with added salt. Canned or smoked fish. Whole eggs or egg yolks. Chicken or [place] with skin. Dairy Whole or 2% milk, cream, and half-and-half. Whole or full-fat cream cheese. Whole-fat or sweetened yogurt. Full-fat cheese. Nondairy creamers. Whipped toppings. Processed cheese and cheese spreads. Fats and oils Butter. Stick margarine. Lard. Shortening. Ghee. Bacon fat. Tropical oils, such as coconut, palm kernel, or palm oil. Seasoning and other foods Salted popcorn and pretzels. Onion salt, garlic salt, seasoned salt, table salt, and sea salt. Worcestershire sauce. Tartar sauce. Barbecue sauce. Teriyaki sauce. Soy sauce, including reduced-sodium. Steak sauce. Canned and packaged gravies. Fish sauce. Oyster sauce. Cocktail sauce. Horseradish that you find on the shelf. Ketchup. Mustard. Meat flavorings and tenderizers. Bouillon cubes. Hot sauce and Tabasco sauce. Premade or packaged marinades. Premade or packaged taco seasonings. Relishes. Regular salad dressings. Where to find more information:  National Heart, Lung, and [place]: https://wilson-eaton.com/  American Heart Association: www.heart.org Summary  The DASH eating plan is a healthy eating plan that has been shown to reduce high blood pressure (hypertension). It may also reduce your risk for type 2 diabetes, heart disease, and stroke.  With the DASH eating plan, you should limit salt (sodium)  intake to 2,300 mg a day. If you have hypertension, you may need to reduce your sodium intake to 1,500 mg a day.  When on the DASH eating plan, aim to eat more fresh fruits and vegetables, whole grains, lean proteins, low-fat dairy, and heart-healthy fats.  Work with your health care provider or diet and nutrition specialist (dietitian) to adjust your eating plan to your  individual calorie needs. This information is not intended to replace advice given to you by your health care provider. Make sure you discuss any questions you have with your health care provider. Document Released: 09/07/2011 Document Revised: 09/11/2016 Document Reviewed: 09/11/2016 Elsevier Interactive Patient Education  [employer].     If you need a refill on your cardiac medications before your next appointment, please call your pharmacy.

## 2017-12-19 ENCOUNTER — Ambulatory Visit: Payer: Medicare Other | Admitting: Podiatry

## 2017-12-20 ENCOUNTER — Ambulatory Visit: Payer: Medicare Other | Admitting: Podiatry

## 2017-12-20 ENCOUNTER — Encounter: Payer: Self-pay | Admitting: Podiatry

## 2017-12-20 DIAGNOSIS — S81801D Unspecified open wound, right lower leg, subsequent encounter: Secondary | ICD-10-CM

## 2017-12-24 ENCOUNTER — Ambulatory Visit (INDEPENDENT_AMBULATORY_CARE_PROVIDER_SITE_OTHER): Payer: Medicare Other | Admitting: *Deleted

## 2017-12-24 DIAGNOSIS — I6340 Cerebral infarction due to embolism of unspecified cerebral artery: Secondary | ICD-10-CM

## 2017-12-24 LAB — CUP PACEART REMOTE DEVICE CHECK
Date Time Interrogation Session: 20190220232451
MDC IDC PG IMPLANT DT: 20181022

## 2017-12-25 NOTE — Progress Notes (Signed)
[facility] Summary Report / Loop Recorder 

## 2017-12-27 ENCOUNTER — Ambulatory Visit: Payer: Medicare Other | Admitting: Family Medicine

## 2017-12-27 ENCOUNTER — Encounter: Payer: Self-pay | Admitting: Family Medicine

## 2017-12-27 VITALS — BP 118/68 | HR 98 | Temp 97.8°F | Ht 68.0 in

## 2017-12-27 DIAGNOSIS — N390 Urinary tract infection, site not specified: Secondary | ICD-10-CM | POA: Diagnosis not present

## 2017-12-27 DIAGNOSIS — R300 Dysuria: Secondary | ICD-10-CM | POA: Diagnosis not present

## 2017-12-27 DIAGNOSIS — R42 Dizziness and giddiness: Secondary | ICD-10-CM | POA: Diagnosis not present

## 2017-12-27 LAB — POCT URINALYSIS DIPSTICK
NITRITE UA: POSITIVE
PH UA: 5.5 (ref 5.0–8.0)
RBC UA: NEGATIVE
UROBILINOGEN UA: 4 U/dL — AB

## 2017-12-27 MED ORDER — ZOLPIDEM TARTRATE 10 MG PO TABS: 10.0000 mg | ORAL_TABLET | Freq: Every day | ORAL | 5 refills | Status: DC

## 2017-12-27 MED ORDER — CEPHALEXIN 500 MG PO CAPS: 500.0000 mg | ORAL_CAPSULE | Freq: Three times a day (TID) | ORAL | 0 refills | Status: DC

## 2017-12-27 MED ORDER — COLCHICINE 0.6 MG PO TABS: 0.6000 mg | ORAL_TABLET | Freq: Four times a day (QID) | ORAL | 5 refills | Status: DC | PRN

## 2017-12-27 MED ORDER — ONDANSETRON HCL 8 MG PO TABS: 8.0000 mg | ORAL_TABLET | Freq: Three times a day (TID) | ORAL | 5 refills | Status: DC | PRN

## 2017-12-27 MED ORDER — OXYCODONE-ACETAMINOPHEN 10-325 MG PO TABS: 1.0000 | ORAL_TABLET | Freq: Four times a day (QID) | ORAL | 0 refills | Status: AC | PRN

## 2017-12-27 NOTE — Progress Notes (Signed)
   Subjective:    Patient ID: Melanie Mcdonald, female    DOB: 09/28/1933, 84 y.o.   MRN: [id number]  HPI Here for 4 days of urinary urgency and burning. No fever. She is drinking water and using AZO. She has been struggling with chronic dizziness form multiple sources for several years but infections seem to worsen this .    Review of Systems  Constitutional: Negative.   Respiratory: Negative.   Cardiovascular: Negative.   Gastrointestinal: Negative.   Genitourinary: Positive for dysuria, frequency and urgency.  Neurological: Positive for dizziness.       Objective:   Physical Exam  Constitutional: She is oriented to person, place, and time. She appears well-developed and well-nourished.  Using a walker   Cardiovascular: Normal rate, regular rhythm, normal heart sounds and intact distal pulses.  Pulmonary/Chest: Effort normal and breath sounds normal. No respiratory distress. She has no wheezes. She has no rales.  Abdominal: Soft. Bowel sounds are normal. She exhibits no distension and no mass. There is no tenderness. There is no rebound and no guarding.  Neurological: She is alert and oriented to person, place, and time.          Assessment & Plan:  UTI, treat with Keflex 500 mg for 7 days. Hopefully her dizziness will improve. Culture the sample.  Alysia Penna, MD

## 2017-12-28 LAB — URINE CULTURE
MICRO NUMBER: 90388393
SPECIMEN QUALITY: ADEQUATE

## 2017-12-28 NOTE — Progress Notes (Signed)
  Subjective:  Patient ID: Melanie Mcdonald, female    DOB: 10/30/1932,  MRN: [id number]  Chief Complaint  Patient presents with  . Wound Check    right lower leg, lateral side - dressing hasn't been changed since monday   84 y.o. female returns for wound care.  States the dressing has been changed since Monday.  Still reports pain from the wound.  Denies new issues denies nausea vomiting fever chills  Objective:  There were no vitals filed for this visit. General AA&O x3. Normal mood and affect.  Vascular Foot warm to touch.  Neurologic Sensation grossly diminished.  Dermatologic (Wound) Wound Location: Right lateral leg Wound Measurement:2x1.5 Wound Base: Mixed Granular/Fibrotic Peri-wound: Normal Exudate: Scant/small amount Serous exudate  Wound progress: improved since last check.  Orthopedic: No pain to palpation either foot.   Assessment & Plan:  Patient was evaluated and treated and all questions answered.  Ulcer right leg -Debridement as below. -Dressed with medihoney, DSD. -Continue home health care.  Procedure: Selective Debridement of Wound Rationale: Removal of devitalized tissue from the wound to promote healing.  Pre-Debridement Wound Measurements: 2 cm x 1.5 cm x 0.1 cm  Post-Debridement Wound Measurements: same as pre-debridement. Type of Debridement: Selective Tissue Removed: Devitalized soft-tissue Instrumentation: 3-0 mm dermal curette Dressing: Dry, sterile, compression dressing. Disposition: Patient tolerated procedure well. Patient to return in 1 week for follow-up.      Return in about 2 weeks (around 01/03/2018) for Wound Care.

## 2018-01-03 ENCOUNTER — Ambulatory Visit: Payer: Medicare Other | Admitting: Podiatry

## 2018-01-03 ENCOUNTER — Telehealth: Payer: Self-pay | Admitting: *Deleted

## 2018-01-03 DIAGNOSIS — S81801D Unspecified open wound, right lower leg, subsequent encounter: Secondary | ICD-10-CM

## 2018-01-03 NOTE — Telephone Encounter (Signed)
Prior auth for Zolpidem 10mg  sent to [email or web address].

## 2018-01-04 ENCOUNTER — Other Ambulatory Visit: Payer: Self-pay | Admitting: Cardiology

## 2018-01-07 ENCOUNTER — Telehealth: Payer: Self-pay | Admitting: Podiatry

## 2018-01-07 ENCOUNTER — Telehealth: Payer: Self-pay | Admitting: Family Medicine

## 2018-01-07 NOTE — Telephone Encounter (Signed)
Pt states the leg is doing better without the last wrap and in just the ace. I told pt to call if there was any change in the leg, pt states she would and I reminded her of the 01/17/2018 2:45pm appt.
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
## 2018-01-07 NOTE — Telephone Encounter (Signed)
Hello, this is Lattie Haw, a Armed forces operational officer at [employer]. Melanie Mcdonald saw Dr. March Rummage last Thursday and he put some kind of wrap, a cotton material on her leg and it started itching her really bad over the weekend so she cut it off because she couldn't stand it any longer. She agreed it was okay for me to call and let you know that so you can mark it in her chart that whatever that was did not agree with her skin and it made her very itchy. Just let Dr. March Rummage know that since she cut that off herself she wrapped herself with ACE wrap and she said that seems like it's working pretty good. Her number is [phone]. If you need to follow up with her, please give her a return call. I don't need a return call but if you need me, my number is [phone] ext [phone]. Thank you.

## 2018-01-08 NOTE — Telephone Encounter (Signed)
Called and spoke with pt. Pt stated that she is very depressed. I recommend that the pt come in to be seen by Dr. Sarajane Jews to discuss her concerns and see what he could do to help give her some relief. Pt is scheduled to see Dr. Sarajane Jews this week on Friday.

## 2018-01-08 NOTE — Telephone Encounter (Signed)
I have faxed pt's updated medication list as well.

## 2018-01-10 DIAGNOSIS — Z794 Long term (current) use of insulin: Secondary | ICD-10-CM

## 2018-01-10 DIAGNOSIS — E1142 Type 2 diabetes mellitus with diabetic polyneuropathy: Secondary | ICD-10-CM | POA: Diagnosis not present

## 2018-01-10 DIAGNOSIS — I2510 Atherosclerotic heart disease of native coronary artery without angina pectoris: Secondary | ICD-10-CM | POA: Diagnosis not present

## 2018-01-10 DIAGNOSIS — I5033 Acute on chronic diastolic (congestive) heart failure: Secondary | ICD-10-CM | POA: Diagnosis not present

## 2018-01-10 DIAGNOSIS — I110 Hypertensive heart disease with heart failure: Secondary | ICD-10-CM | POA: Diagnosis not present

## 2018-01-10 DIAGNOSIS — I6931 Attention and concentration deficit following cerebral infarction: Secondary | ICD-10-CM | POA: Diagnosis not present

## 2018-01-11 ENCOUNTER — Telehealth: Payer: Self-pay | Admitting: Podiatry

## 2018-01-11 ENCOUNTER — Ambulatory Visit: Payer: Medicare Other | Admitting: Family Medicine

## 2018-01-11 ENCOUNTER — Ambulatory Visit: Payer: Self-pay | Admitting: *Deleted

## 2018-01-11 DIAGNOSIS — Z0289 Encounter for other administrative examinations: Secondary | ICD-10-CM

## 2018-01-11 NOTE — Telephone Encounter (Signed)
Pt  Missed  Her  Appointment this  Am    States  She  Was  Unable  To make  It. She  States she  Wants  To  Reschedule it for  Monday. Pt  Reports  Being  Weak  And  Depressed . Pt  Denies  Any  Suicidal  Ideations or thoughts  Of hurting herself. She  Was  Offered  An appointment later today  With Dr fry she  Declined  Stating  A  Friend was  Coming in to  See  Her.   Pt  Advised  To   Call  Back  If  Any  Changes  In her thoughts or  Feeling.   Reason for Disposition . [1] Fatigue (i.e., tires easily, decreased energy) AND [2] persists > 1 week  Answer Assessment - Initial Assessment Questions 1. DESCRIPTION: "Describe how you are feeling."     Depressed  And  Tired    2. SEVERITY: "How bad is it?"  "Can you stand and walk?"   - MILD - Feels weak or tired, but does not interfere with work, school or normal activities   - [place] to stand and walk; weakness interferes with work, school, or normal activities   - SEVERE - Unable to stand or walk       Moderate  3. ONSET:  "When did the weakness begin?"         Chronic  Weakness   Worse  Over last  Several  Weeks   4. CAUSE: "What do you think is causing the weakness?"        CHF    And  Depression   5. MEDICINES: "Have you recently started a new medicine or had a change in the amount of a medicine?"      Ran out of Ambien    6. OTHER SYMPTOMS: "Do you have any other symptoms?" (e.g., chest pain, fever, cough, SOB, vomiting, diarrhea, bleeding)       No  7. PREGNANCY: "Is there any chance you are pregnant?" "When was your last menstrual period?"     n/a  Protocols used: WEAKNESS (GENERALIZED) AND FATIGUE-A-AH

## 2018-01-11 NOTE — Telephone Encounter (Signed)
This is [name], Therapist, sports with [facility]. I was calling to let your office know that Mrs. Mies cancelled a wound care appointment today. She had an appointment with her primary doctor so that's why she cancelled the appointment with [place]. My phone number is [phone] if you need to call me back. Thank you.

## 2018-01-14 ENCOUNTER — Telehealth: Payer: Self-pay

## 2018-01-14 ENCOUNTER — Ambulatory Visit: Payer: Medicare Other | Admitting: Family Medicine

## 2018-01-14 ENCOUNTER — Encounter: Payer: Self-pay | Admitting: Family Medicine

## 2018-01-14 VITALS — BP 108/58 | HR 91 | Temp 97.9°F | Ht 68.0 in | Wt 233.6 lb

## 2018-01-14 DIAGNOSIS — I83019 Varicose veins of right lower extremity with ulcer of unspecified site: Secondary | ICD-10-CM | POA: Diagnosis not present

## 2018-01-14 DIAGNOSIS — L97919 Non-pressure chronic ulcer of unspecified part of right lower leg with unspecified severity: Secondary | ICD-10-CM

## 2018-01-14 DIAGNOSIS — G4700 Insomnia, unspecified: Secondary | ICD-10-CM

## 2018-01-14 MED ORDER — SUVOREXANT 20 MG PO TABS
1.0000 | ORAL_TABLET | Freq: Every day | ORAL | 2 refills | Status: DC
Start: 2018-01-14 — End: 2018-02-01

## 2018-01-14 NOTE — Telephone Encounter (Signed)
PA need for Ambien  Form's placed on Melanie Mcdonald's desk

## 2018-01-14 NOTE — Progress Notes (Signed)
   Subjective:    Patient ID: Melanie Mcdonald, female    DOB: 07-07-1933, 84 y.o.   MRN: [id number]  HPI Here for several issues. First she has been dealing with a wound on the right lower leg since November, and she has been seeing Dr. March Rummage for podiatric care. The wound has not been responding well to treatments so far. There is no pain involved. Also her insurance will not cover Ambien or Temazepam, and she needs an alternative sleep medication.    Review of Systems  Constitutional: Negative.   Respiratory: Negative.   Skin: Positive for wound.  Psychiatric/Behavioral: Positive for dysphoric mood and sleep disturbance. The patient is nervous/anxious.        Objective:   Physical Exam  Constitutional: She is oriented to person, place, and time. She appears well-developed and well-nourished.  Cardiovascular: Normal rate, regular rhythm, normal heart sounds and intact distal pulses.  Pulmonary/Chest: Effort normal and breath sounds normal. No respiratory distress. She has no wheezes. She has no rales.  Neurological: She is alert and oriented to person, place, and time.  Skin:  The lateral right lower leg has a shallow ulcerated area about 1.5 cm in diameter.           Assessment & Plan:  For the wound, we will refer her to the Wound Clinic for further evaluation. For sleep, try Belsomra 20 mg at bedtime.  Alysia Penna, MD

## 2018-01-15 ENCOUNTER — Telehealth: Payer: Self-pay | Admitting: Podiatry

## 2018-01-15 NOTE — Telephone Encounter (Signed)
I Skyped message to Dr. March Rummage.

## 2018-01-15 NOTE — Telephone Encounter (Signed)
This is Melanie Mcdonald, Therapist, sports with [facility]. Dr. March Rummage tried some multi layer compression wraps and [name] stated she could not tolerate the cotton layer, that it caused her to itch and break out. She stated she had to remove them and take them off. We are not going to be able to apply that treatment with how she reacted. Could you let us know what else you might want to try? She doesn't think she could tolerate an unna wrap, I'm hoping maybe she could if we wrapped them correctly but she said she doesn't do well with them. So let us know what you want [place] to do. I do believe you did say we could still do the medihoney on the wound bed area and I will do that part. My number is [phone]. Thank you.

## 2018-01-15 NOTE — Telephone Encounter (Signed)
Hi this is Marlowe Kays, Therapist, sports with [facility] again. I just called on Jamey Ripa. I just wanted to add that the wound itself seems deeper and regressed. It does have some maceration around it when I just took of this dressing. I don't know if it was like that when she saw you but I wanted to note that too. She has not had any compression on through the weekend I don't think other than just the ace just to hold the dressing. I just wanted to add that information. You can call me back at [phone].

## 2018-01-16 NOTE — Telephone Encounter (Signed)
We completely switched to Belsomra, at least for now

## 2018-01-16 NOTE — Telephone Encounter (Signed)
According to the last office note by Dr Sarajane Jews pt was given Belsomra for sleep. No prior auth initiated for Zolpidem.

## 2018-01-16 NOTE — Telephone Encounter (Signed)
Sent to PCP did we want to completely switch to the belsomra? Or were we going to try Belsomra while waiting on the PA for Ambien?

## 2018-01-17 ENCOUNTER — Telehealth: Payer: Self-pay | Admitting: Family Medicine

## 2018-01-17 ENCOUNTER — Ambulatory Visit: Payer: Medicare Other | Admitting: Podiatry

## 2018-01-17 DIAGNOSIS — L97911 Non-pressure chronic ulcer of unspecified part of right lower leg limited to breakdown of skin: Secondary | ICD-10-CM

## 2018-01-17 DIAGNOSIS — S81801D Unspecified open wound, right lower leg, subsequent encounter: Secondary | ICD-10-CM | POA: Diagnosis not present

## 2018-01-17 MED ORDER — CEPHALEXIN 500 MG PO CAPS: 500.0000 mg | ORAL_CAPSULE | Freq: Three times a day (TID) | ORAL | 0 refills | Status: DC

## 2018-01-17 NOTE — Telephone Encounter (Signed)
Sent to PCP to advise 

## 2018-01-17 NOTE — Telephone Encounter (Signed)
Attempted to call patient. No answer. Voicemail full, unable to leave message.  We have also attempted to contact her about her wound center appt.  Appt info below.  Pt is scheduled for :01/25/2018@9 :15 AM  [facility] in [place], [place] Address: [address] [place], [place] [address] Phone: [phone]

## 2018-01-17 NOTE — Telephone Encounter (Signed)
Pt. Called to report she "is afraid she is going to lose her leg because of this wound." States wound to right leg is"hurting more." States she has an appointment with podiatry today. Pt. Wants to know if Dr. Sarajane Jews thinks she should be in the hospital. Instructed pt. To keep her appointment today and this information would be forwarded to Dr. Sarajane Jews.

## 2018-01-17 NOTE — Telephone Encounter (Signed)
Tell her that I do not think this requires hospitalization at this point. Hopefully she will be able to see the Wound Clinic soon (the referral was done)

## 2018-01-21 ENCOUNTER — Telehealth: Payer: Self-pay | Admitting: Family Medicine

## 2018-01-21 NOTE — Telephone Encounter (Signed)
Copied from [place] [id number]. Topic: General - Other >> Jan 21, 2018  8:54 AM Darl Householder, RMA wrote: Reason for CRM: patient wants to notify Dr. Sarajane Jews that she had a mini stroke on yesterday 01/20/18, pt is also requesting a call back concerning sleeping pills

## 2018-01-21 NOTE — Telephone Encounter (Signed)
Called and spoke with the pt. Pt was wanting to know about the PA on her Ambien. I explained to the pt that her insurance will NOT  cover Ambien or Temazepam so Dr. Sarajane Jews sent in a medication called Belsomra that her insurance will cover. Pt stated that she has not heard from her pharmacy about the medication.   I told the pt that the Rx was sent on 4/15 and sent into her pharmacy. I called the pharmacy myself and they have had the medication ready for her since 4/15 pt just wasn't aware. Pt is now aware that the medication is ready at the pharmacy for her to pick up to help aid her so she can get some sleep.   Sent to PCP as an [place] pt had a Stroke

## 2018-01-22 ENCOUNTER — Telehealth: Payer: Self-pay | Admitting: Family Medicine

## 2018-01-22 NOTE — Telephone Encounter (Signed)
Noted  

## 2018-01-22 NOTE — Telephone Encounter (Signed)
Sent to PCP as an FYI  

## 2018-01-22 NOTE — Telephone Encounter (Signed)
Copied from [place] [id number]. Topic: Quick Communication - See Telephone Encounter >> Jan 22, 2018  3:06 PM Bea Graff, NT wrote: CRM for notification. See Telephone encounter for: 01/22/18. Tracey a nurse with [facility] is calling to let Dr. Sarajane Jews know that the Keflex has a interaction with the drug this pt is on, fluconazole (DIFLUCAN) IVPB 200 mg. CB#: [phone]

## 2018-01-24 ENCOUNTER — Encounter: Payer: Self-pay | Admitting: Podiatry

## 2018-01-24 ENCOUNTER — Ambulatory Visit: Payer: Medicare Other | Admitting: Podiatry

## 2018-01-24 DIAGNOSIS — S81801D Unspecified open wound, right lower leg, subsequent encounter: Secondary | ICD-10-CM

## 2018-01-24 DIAGNOSIS — I872 Venous insufficiency (chronic) (peripheral): Secondary | ICD-10-CM

## 2018-01-24 DIAGNOSIS — R600 Localized edema: Secondary | ICD-10-CM

## 2018-01-25 ENCOUNTER — Encounter (HOSPITAL_BASED_OUTPATIENT_CLINIC_OR_DEPARTMENT_OTHER): Payer: Medicare Other | Attending: Internal Medicine

## 2018-01-25 DIAGNOSIS — L97211 Non-pressure chronic ulcer of right calf limited to breakdown of skin: Secondary | ICD-10-CM | POA: Diagnosis not present

## 2018-01-25 DIAGNOSIS — I87331 Chronic venous hypertension (idiopathic) with ulcer and inflammation of right lower extremity: Secondary | ICD-10-CM | POA: Insufficient documentation

## 2018-01-25 DIAGNOSIS — I890 Lymphedema, not elsewhere classified: Secondary | ICD-10-CM | POA: Diagnosis not present

## 2018-01-25 DIAGNOSIS — I110 Hypertensive heart disease with heart failure: Secondary | ICD-10-CM | POA: Insufficient documentation

## 2018-01-25 DIAGNOSIS — I509 Heart failure, unspecified: Secondary | ICD-10-CM | POA: Insufficient documentation

## 2018-01-25 DIAGNOSIS — E1140 Type 2 diabetes mellitus with diabetic neuropathy, unspecified: Secondary | ICD-10-CM | POA: Diagnosis not present

## 2018-01-25 NOTE — Progress Notes (Signed)
  Subjective:  Patient ID: Melanie Mcdonald, female    DOB: [birth year],  MRN: [id number]  Chief Complaint  Patient presents with  . Wound Check    Rt leg ulcer   84 y.o. female returns for wound care.  States the wound is still burning but doing better.  States that her PCP referred her to the wound care center  Objective:  There were no vitals filed for this visit. General AA&O x3. Normal mood and affect.  Vascular Foot warm to touch.  Neurologic Sensation grossly diminished.  Dermatologic (Wound) Wound Location: Right lateral leg Wound Measurement:3x2 Wound Base: Mixed Granular/Fibrotic Peri-wound: Normal Exudate: Scant/small amount Serous exudate  Wound progress: improved since last check.  Orthopedic: No pain to palpation either foot.   Assessment & Plan:  Patient was evaluated and treated and all questions answered.  Ulcer right leg -Debridement as below. -Dressed with medihoney, DSD. -I again discussed the benefits of compression therapy.  Multilayer compression dressing today consisting of stockinette cast padding Unna boot and Coban applied patient reported it was comfortable not too tight. -Advised patient to keep her appointment with wound care center.  Procedure: Selective Debridement of Wound Rationale: Removal of devitalized tissue from the wound to promote healing.  Pre-Debridement Wound Measurements: 3 cm x 2 cm x 0.2 cm  Post-Debridement Wound Measurements: same as pre-debridement. Type of Debridement: Selective Tissue Removed: Devitalized soft-tissue Instrumentation: 3-0 mm dermal curette Dressing: Dry, sterile, compression dressing. Disposition: Patient tolerated procedure well. Patient to return in 1 week for follow-up.   Return in about 2 weeks (around 02/07/2018) for Wound Care.

## 2018-01-27 NOTE — Progress Notes (Signed)
  Subjective:  Patient ID: Melanie Mcdonald, female    DOB: [birth year],  MRN: [id number]  No chief complaint on file.  84 y.o. female returns for wound care.  Present today.  Asking why this will not heal.  States that the medihoney burns.  Objective:  There were no vitals filed for this visit. General AA&O x3. Normal mood and affect.  Vascular Foot warm to touch.  Neurologic Sensation grossly diminished.  Dermatologic (Wound) Wound Location: Right lateral leg Wound Measurement:3x1 Wound Base: Mixed Granular/Fibrotic Peri-wound: Normal Exudate: Scant/small amount Serous exudate  Wound progress: improved since last check.  Orthopedic: No pain to palpation either foot.   Assessment & Plan:  Patient was evaluated and treated and all questions answered.  Ulcer right leg -Debridement as below. -Dressed with medihoney, DSD. -Discussed that the wound will have patient does not pursue compression therapy.  Patient has been reluctant for compression therapy in the past.  Has been noncompliant with Lasix.  Procedure: Selective Debridement of Wound Rationale: Removal of devitalized tissue from the wound to promote healing.  Pre-Debridement Wound Measurements: 3 cm x 1 cm x 0.1 cm  Post-Debridement Wound Measurements: same as pre-debridement. Type of Debridement: Selective Tissue Removed: Devitalized soft-tissue Instrumentation: 3-0 mm dermal curette Dressing: Dry, sterile, compression dressing. Disposition: Patient tolerated procedure well. Patient to return in 1 week for follow-up.        Return in about 2 weeks (around 01/17/2018) for Wound Care.

## 2018-01-28 ENCOUNTER — Ambulatory Visit (INDEPENDENT_AMBULATORY_CARE_PROVIDER_SITE_OTHER): Payer: Medicare Other | Admitting: *Deleted

## 2018-01-28 DIAGNOSIS — I6340 Cerebral infarction due to embolism of unspecified cerebral artery: Secondary | ICD-10-CM

## 2018-01-28 NOTE — Telephone Encounter (Signed)
Spoke to [place] at [facility] and advised to continue with keflex and fluconazole since interaction is rare.  No further action required.  Will close note.

## 2018-01-28 NOTE — Progress Notes (Signed)
  Subjective:  Patient ID: Melanie Mcdonald, female    DOB: 25-Oct-1932,  MRN: [id number]  No chief complaint on file.  84 y.o. female returns for wound care. Denies new issues.  Objective:  There were no vitals filed for this visit. General AA&O x3. Normal mood and affect.  Vascular Foot warm to touch.  Neurologic Sensation grossly diminished.  Dermatologic (Wound) Wound Location: Right lateral leg Wound Measurement:3x1 Wound Base: Mixed Granular/Fibrotic Peri-wound: Normal Exudate: Scant/small amount Serous exudate  Wound progress: improved since last check.  Orthopedic: No pain to palpation either foot.   Assessment & Plan:  Patient was evaluated and treated and all questions answered.  Ulcer right leg -Debridement as below. -Dressed with medihoney, DSD.  Procedure: Selective Debridement of Wound Rationale: Removal of devitalized tissue from the wound to promote healing.  Pre-Debridement Wound Measurements: 3 cm x 1 cm x 0.1 cm  Post-Debridement Wound Measurements: same as pre-debridement. Type of Debridement: Selective Tissue Removed: Devitalized soft-tissue Instrumentation: 3-0 mm dermal curette Dressing: Dry, sterile, compression dressing. Disposition: Patient tolerated procedure well. Patient to return in 1 week for follow-up.   Return in about 1 week (around 01/24/2018) for Wound Care.

## 2018-01-28 NOTE — Progress Notes (Signed)
[facility] Summary Report / Loop Recorder 

## 2018-01-28 NOTE — Telephone Encounter (Signed)
Tell her to use the medication as prescribed. This interaction is rare.

## 2018-01-31 ENCOUNTER — Telehealth: Payer: Self-pay

## 2018-01-31 ENCOUNTER — Ambulatory Visit: Payer: Self-pay

## 2018-01-31 NOTE — Telephone Encounter (Signed)
Patient called and states that the Belsomra is making her "feel weird and out of sorts" and that it is not helping her sleep. She also c/o increased ShOB and that her "air is not moving". She is breathing very deeply when speaking and almost seems to be wheezing. She states that she does not have any way to check her O2 sats but that her son does have O2 and she will try that and see if it helps. She states that she has not gained any weight in the past few days and is not sure why she can't breathe.  Spoke with Dr. Sarajane Jews and he feels pt's CHF may be flaring. He is recommending pt go to ED.   Pt has refused to seek eval at ED. She states that she will try son's O2 and call us back if that does not help. Advised pt to call back ASAP if still having difficulty breathing.

## 2018-01-31 NOTE — Telephone Encounter (Signed)
Pt called office to say that she was not getting any worse and did feel some better after using O2. She is trying not to do anything exertive and to rest as much as possible. I inquired about her coming in to be seen tomorrow, however she already has an appt at the wound clinic for her leg. I advised her to ask them to check her O2 sats. Also advised her that if her symptoms continue or worsen to call our office ASAP, we have providers on call 24/7. She voiced understanding.

## 2018-01-31 NOTE — Telephone Encounter (Signed)
Pt calling with SOB at rest and with exertion. Pt has been using her son's oxygen during the day while her son is at work. Her voice is very low in volume but is speaking in full sentences. Pt states that she is not taking her Lasix as per prescribed. She is taking 1/2 pill twice a day (its ordered 40 mg twice a day). Concerned that fluid is building up in her lung and that may be the reason she is wearing her son's O2. Called Sheena at PCP office who offered a 3:00 pm appt. Pt stated her son will not be able to bring her. Per Vita Barley advised her of 3 options: Go to [facility], go to ED, go to 3:00 appt Friday. Pt refused all 3 options. She stated her son goes to [employer] and he wont be able to  take her. Asked pt for her son's number and called him to update him on situation. Son said that he will take her to 3:00 appt. Sheena scheduled appt.  Care advice given. Encourage pt to take her lasix and to sip on water for her dry mouth. Reason for Disposition . [1] Longstanding difficulty breathing (e.g., CHF, COPD, emphysema) AND [2] WORSE than normal  Answer Assessment - Initial Assessment Questions 1. RESPIRATORY STATUS: "Describe your breathing?" (e.g., wheezing, shortness of breath, unable to speak, severe coughing)      Shortness of breath, "takes effort to breathe" 2. ONSET: "When did this breathing problem begin?"      Pt does not states it is a gradual thing 3. PATTERN "Does the difficult breathing come and go, or has it been constant since it started?"      Come and goes better in the am  4. SEVERITY: "How bad is your breathing?" (e.g., mild, moderate, severe)    - MILD: No SOB at rest, mild SOB with walking, speaks normally in sentences, can lay down, no retractions, pulse < 100.    - MODERATE: SOB at rest, SOB with minimal exertion and prefers to sit, cannot lie down flat, speaks in phrases, mild retractions, audible wheezing, pulse 100-120.    - SEVERE: Very SOB at rest, speaks in single words,  struggling to breathe, sitting hunched forward, retractions, pulse > 120      moderate 5. RECURRENT SYMPTOM: "Have you had difficulty breathing before?" If so, ask: "When was the last time?" and "What happened that time?"      no 6. CARDIAC HISTORY: "Do you have any history of heart disease?" (e.g., heart attack, angina, bypass surgery, angioplasty)      CHF 7. LUNG HISTORY: "Do you have any history of lung disease?"  (e.g., pulmonary embolus, asthma, emphysema)     asthma 8. CAUSE: "What do you think is causing the breathing problem?"      Head is stopped up -doesn't know what's causing the breathing problem 9. OTHER SYMPTOMS: "Do you have any other symptoms? (e.g., dizziness, runny nose, cough, chest pain, fever)      Runny nose, dizziness with standing uses walker to walk 10. PREGNANCY: "Is there any chance you are pregnant?" "When was your last menstrual period?"       n/a 11. TRAVEL: "Have you traveled out of the country in the last month?" (e.g., travel history, exposures)       n/a  Protocols used: BREATHING DIFFICULTY-A-AH

## 2018-02-01 ENCOUNTER — Encounter: Payer: Self-pay | Admitting: Family Medicine

## 2018-02-01 ENCOUNTER — Ambulatory Visit: Payer: Medicare Other | Admitting: Family Medicine

## 2018-02-01 ENCOUNTER — Encounter (HOSPITAL_BASED_OUTPATIENT_CLINIC_OR_DEPARTMENT_OTHER): Payer: Medicare Other | Attending: Internal Medicine

## 2018-02-01 VITALS — BP 120/58 | HR 85 | Temp 98.5°F | Ht 68.0 in | Wt 227.4 lb

## 2018-02-01 DIAGNOSIS — M7989 Other specified soft tissue disorders: Secondary | ICD-10-CM

## 2018-02-01 DIAGNOSIS — I87331 Chronic venous hypertension (idiopathic) with ulcer and inflammation of right lower extremity: Secondary | ICD-10-CM | POA: Diagnosis present

## 2018-02-01 DIAGNOSIS — I890 Lymphedema, not elsewhere classified: Secondary | ICD-10-CM | POA: Diagnosis not present

## 2018-02-01 DIAGNOSIS — I110 Hypertensive heart disease with heart failure: Secondary | ICD-10-CM | POA: Insufficient documentation

## 2018-02-01 DIAGNOSIS — I5033 Acute on chronic diastolic (congestive) heart failure: Secondary | ICD-10-CM

## 2018-02-01 DIAGNOSIS — E1151 Type 2 diabetes mellitus with diabetic peripheral angiopathy without gangrene: Secondary | ICD-10-CM | POA: Diagnosis not present

## 2018-02-01 DIAGNOSIS — I5022 Chronic systolic (congestive) heart failure: Secondary | ICD-10-CM

## 2018-02-01 DIAGNOSIS — L03115 Cellulitis of right lower limb: Secondary | ICD-10-CM | POA: Diagnosis not present

## 2018-02-01 DIAGNOSIS — I872 Venous insufficiency (chronic) (peripheral): Secondary | ICD-10-CM | POA: Insufficient documentation

## 2018-02-01 DIAGNOSIS — G47419 Narcolepsy without cataplexy: Secondary | ICD-10-CM

## 2018-02-01 DIAGNOSIS — I509 Heart failure, unspecified: Secondary | ICD-10-CM | POA: Diagnosis not present

## 2018-02-01 DIAGNOSIS — R531 Weakness: Secondary | ICD-10-CM

## 2018-02-01 DIAGNOSIS — L97812 Non-pressure chronic ulcer of other part of right lower leg with fat layer exposed: Secondary | ICD-10-CM | POA: Diagnosis not present

## 2018-02-01 DIAGNOSIS — E118 Type 2 diabetes mellitus with unspecified complications: Secondary | ICD-10-CM

## 2018-02-01 DIAGNOSIS — G4730 Sleep apnea, unspecified: Secondary | ICD-10-CM | POA: Diagnosis not present

## 2018-02-01 DIAGNOSIS — I10 Essential (primary) hypertension: Secondary | ICD-10-CM

## 2018-02-01 DIAGNOSIS — Z794 Long term (current) use of insulin: Secondary | ICD-10-CM

## 2018-02-01 HISTORY — DX: Chronic systolic (congestive) heart failure: I50.22

## 2018-02-01 LAB — CUP PACEART REMOTE DEVICE CHECK
Implantable Pulse Generator Implant Date: 20181022
MDC IDC SESS DTM: 20190325234050

## 2018-02-01 MED ORDER — CEPHALEXIN 500 MG PO CAPS: 500.0000 mg | ORAL_CAPSULE | Freq: Three times a day (TID) | ORAL | 0 refills | Status: DC

## 2018-02-01 MED ORDER — FUROSEMIDE 40 MG PO TABS: 40.0000 mg | ORAL_TABLET | Freq: Two times a day (BID) | ORAL | 11 refills | Status: DC

## 2018-02-01 NOTE — Progress Notes (Signed)
   Subjective:    Patient ID: Melanie Mcdonald, female    DOB: [birth year], 84 y.o.   MRN: [id number]  HPI Here for generalized weakness and SOB. She felt very SOB yesterday and when she spoke to the triage nurses she was advised to go to the ER, given her hx of CHF. However she refused and comes in today instead. Today she actually feels better with little SOB. She still complains of weakness however. She tried Belsomra for sleep but had to stop it due to side effects. She is [facility] for the stasis ulcer and cellulitis on the right lower leg. She saw them this morning and they recommended that she ask [place] about Xrays to rule out osteomyelitis in the leg.    Review of Systems  Constitutional: Positive for fatigue. Negative for fever.  Respiratory: Negative.   Cardiovascular: Positive for leg swelling. Negative for chest pain and palpitations.  Gastrointestinal: Negative.   Genitourinary: Negative.   Neurological: Positive for dizziness and weakness. Negative for headaches.       Objective:   Physical Exam  Constitutional: She is oriented to person, place, and time.  She appears to be at her baseline, somewhat weak but able to walk with her walker   Neck: No thyromegaly present.  Cardiovascular: Normal rate, regular rhythm, normal heart sounds and intact distal pulses.  Pulmonary/Chest: Effort normal and breath sounds normal. No stridor. No respiratory distress. She has no wheezes. She has no rales.  Musculoskeletal:  4+ edema in the right lower leg and 3+ in the left lower leg. The right lower leg is wrapped in a bandage  Lymphadenopathy:    She has no cervical adenopathy.  Neurological: She is alert and oriented to person, place, and time.          Assessment & Plan:  No evidence of a CHF exacerbation today, her lungs are clear. Her weakness is certainly multifactorial. For the right leg cellulitis we will refill her Keflex for another month. She will get Xrays  today of the right lower leg and foot to look for osteomyelitis.  Alysia Penna, MD

## 2018-02-04 DIAGNOSIS — I87331 Chronic venous hypertension (idiopathic) with ulcer and inflammation of right lower extremity: Secondary | ICD-10-CM | POA: Diagnosis not present

## 2018-02-07 ENCOUNTER — Ambulatory Visit: Payer: Medicare Other | Admitting: Podiatry

## 2018-02-08 DIAGNOSIS — I87331 Chronic venous hypertension (idiopathic) with ulcer and inflammation of right lower extremity: Secondary | ICD-10-CM | POA: Diagnosis not present

## 2018-02-13 ENCOUNTER — Telehealth: Payer: Self-pay | Admitting: Family Medicine

## 2018-02-13 NOTE — Telephone Encounter (Signed)
Copied from [place] [phone]. Topic: Quick Communication - See Telephone Encounter >> Feb 13, 2018 10:37 AM Percell Belt A wrote: CRM for notification. See Telephone encounter for: 02/13/18. Olivia Mackie with [facility] [phone] She called to report a fall not injury

## 2018-02-13 NOTE — Telephone Encounter (Signed)
Noted  

## 2018-02-15 DIAGNOSIS — I87331 Chronic venous hypertension (idiopathic) with ulcer and inflammation of right lower extremity: Secondary | ICD-10-CM | POA: Diagnosis not present

## 2018-02-20 LAB — CUP PACEART REMOTE DEVICE CHECK
Date Time Interrogation Session: 20190428001127
MDC IDC PG IMPLANT DT: 20181022

## 2018-02-22 DIAGNOSIS — I87331 Chronic venous hypertension (idiopathic) with ulcer and inflammation of right lower extremity: Secondary | ICD-10-CM | POA: Diagnosis not present

## 2018-02-26 ENCOUNTER — Ambulatory Visit: Payer: Medicare Other | Admitting: Family Medicine

## 2018-02-26 ENCOUNTER — Encounter: Payer: Self-pay | Admitting: Family Medicine

## 2018-02-26 VITALS — BP 112/58 | HR 80 | Temp 98.3°F | Ht 68.0 in | Wt 216.2 lb

## 2018-02-26 DIAGNOSIS — A084 Viral intestinal infection, unspecified: Secondary | ICD-10-CM | POA: Diagnosis not present

## 2018-02-26 DIAGNOSIS — G4700 Insomnia, unspecified: Secondary | ICD-10-CM | POA: Diagnosis not present

## 2018-02-26 DIAGNOSIS — L237 Allergic contact dermatitis due to plants, except food: Secondary | ICD-10-CM | POA: Diagnosis not present

## 2018-02-26 DIAGNOSIS — G894 Chronic pain syndrome: Secondary | ICD-10-CM | POA: Diagnosis not present

## 2018-02-26 MED ORDER — TRIAMCINOLONE ACETONIDE 0.1 % EX CREA: 1.0000 "application " | TOPICAL_CREAM | Freq: Two times a day (BID) | CUTANEOUS | 2 refills | Status: DC

## 2018-02-26 MED ORDER — ZOLPIDEM TARTRATE 10 MG PO TABS: 10.0000 mg | ORAL_TABLET | Freq: Every evening | ORAL | 5 refills | Status: DC | PRN

## 2018-02-26 MED ORDER — DIPHENOXYLATE-ATROPINE 2.5-0.025 MG PO TABS: 2.0000 | ORAL_TABLET | Freq: Four times a day (QID) | ORAL | 5 refills | Status: DC | PRN

## 2018-02-26 MED ORDER — OXYCODONE-ACETAMINOPHEN 10-325 MG PO TABS: 1.0000 | ORAL_TABLET | Freq: Four times a day (QID) | ORAL | 0 refills | Status: DC | PRN

## 2018-02-26 NOTE — Progress Notes (Signed)
   Subjective:    Patient ID: Melanie Mcdonald, female    DOB: 1933/04/20, 84 y.o.   MRN: [id number]  HPI Here for several issues. First she asks for more pain medication for her rib and back pain. Also she has an itchy rash on the face since working in her yard last week. Also she has had watery diarrhea for the past 3 days. Not much cramping. No fever or nausea. Imodium helps a little. Also she wants to try Zolpidem again for sleep. She realizes she may need to pay cash for it since her insurance company may not cover it. She tried Belsomra but it made her very sluggish during the day and she had some sleep walking at night.    Review of Systems  Constitutional: Negative.   Respiratory: Negative.   Cardiovascular: Negative.   Gastrointestinal: Positive for diarrhea. Negative for abdominal distention, abdominal pain, anal bleeding, blood in stool, constipation, nausea, rectal pain and vomiting.  Genitourinary: Negative.   Musculoskeletal: Positive for arthralgias.  Skin: Positive for rash.  Psychiatric/Behavioral: Positive for sleep disturbance.       Objective:   Physical Exam  Constitutional: She is oriented to person, place, and time. She appears well-developed and well-nourished.  Cardiovascular: Normal rate, regular rhythm, normal heart sounds and intact distal pulses.  Pulmonary/Chest: Effort normal and breath sounds normal.  Abdominal: Soft. Bowel sounds are normal. She exhibits no distension and no mass. There is no tenderness. There is no rebound and no guarding. No hernia.  Neurological: She is alert and oriented to person, place, and time.  Skin:  Red papular rash on the right cheek           Assessment & Plan:  For rib pain, we wrote for some more Percocet. She will return for a PMV soon so we can treat this as a chronic pain syndrome. She has a viral enteritis, she may use Lomotil prn and drink fluids. Treat the poison ivy ras with Triamcinolone cream. Use Zolpidem for  sleep. Alysia Penna, MD

## 2018-02-28 ENCOUNTER — Ambulatory Visit (INDEPENDENT_AMBULATORY_CARE_PROVIDER_SITE_OTHER): Payer: Medicare Other | Admitting: *Deleted

## 2018-02-28 DIAGNOSIS — I6340 Cerebral infarction due to embolism of unspecified cerebral artery: Secondary | ICD-10-CM | POA: Diagnosis not present

## 2018-03-01 DIAGNOSIS — I87331 Chronic venous hypertension (idiopathic) with ulcer and inflammation of right lower extremity: Secondary | ICD-10-CM | POA: Diagnosis not present

## 2018-03-01 NOTE — Progress Notes (Signed)
[facility] Summary Report / Loop Recorder 

## 2018-03-04 ENCOUNTER — Telehealth: Payer: Self-pay | Admitting: *Deleted

## 2018-03-04 NOTE — Telephone Encounter (Signed)
[facility] faxed forms to be completed for prior [facility] for Zolpidem tartrate 10mg  and Diphenoxylate-atropine 2.5-0.025mg .  Forms were completed and faxed to [phone].

## 2018-03-08 ENCOUNTER — Ambulatory Visit: Payer: Medicare Other | Admitting: Family Medicine

## 2018-03-08 ENCOUNTER — Encounter (HOSPITAL_BASED_OUTPATIENT_CLINIC_OR_DEPARTMENT_OTHER): Payer: Medicare Other | Attending: Internal Medicine

## 2018-03-08 DIAGNOSIS — I890 Lymphedema, not elsewhere classified: Secondary | ICD-10-CM | POA: Diagnosis not present

## 2018-03-08 DIAGNOSIS — E1151 Type 2 diabetes mellitus with diabetic peripheral angiopathy without gangrene: Secondary | ICD-10-CM | POA: Diagnosis not present

## 2018-03-08 DIAGNOSIS — I87331 Chronic venous hypertension (idiopathic) with ulcer and inflammation of right lower extremity: Secondary | ICD-10-CM | POA: Insufficient documentation

## 2018-03-08 DIAGNOSIS — I110 Hypertensive heart disease with heart failure: Secondary | ICD-10-CM | POA: Diagnosis not present

## 2018-03-08 DIAGNOSIS — L97212 Non-pressure chronic ulcer of right calf with fat layer exposed: Secondary | ICD-10-CM | POA: Insufficient documentation

## 2018-03-08 DIAGNOSIS — I509 Heart failure, unspecified: Secondary | ICD-10-CM | POA: Insufficient documentation

## 2018-03-08 DIAGNOSIS — E11622 Type 2 diabetes mellitus with other skin ulcer: Secondary | ICD-10-CM | POA: Diagnosis not present

## 2018-03-11 DIAGNOSIS — Z794 Long term (current) use of insulin: Secondary | ICD-10-CM

## 2018-03-11 DIAGNOSIS — I5033 Acute on chronic diastolic (congestive) heart failure: Secondary | ICD-10-CM | POA: Diagnosis not present

## 2018-03-11 DIAGNOSIS — I110 Hypertensive heart disease with heart failure: Secondary | ICD-10-CM | POA: Diagnosis not present

## 2018-03-11 DIAGNOSIS — E1142 Type 2 diabetes mellitus with diabetic polyneuropathy: Secondary | ICD-10-CM | POA: Diagnosis not present

## 2018-03-11 DIAGNOSIS — I6931 Attention and concentration deficit following cerebral infarction: Secondary | ICD-10-CM | POA: Diagnosis not present

## 2018-03-11 DIAGNOSIS — E1159 Type 2 diabetes mellitus with other circulatory complications: Secondary | ICD-10-CM | POA: Diagnosis not present

## 2018-03-11 DIAGNOSIS — I2510 Atherosclerotic heart disease of native coronary artery without angina pectoris: Secondary | ICD-10-CM | POA: Diagnosis not present

## 2018-03-11 DIAGNOSIS — L97211 Non-pressure chronic ulcer of right calf limited to breakdown of skin: Secondary | ICD-10-CM | POA: Diagnosis not present

## 2018-03-15 ENCOUNTER — Ambulatory Visit: Payer: Medicare Other | Admitting: Family Medicine

## 2018-03-15 ENCOUNTER — Encounter: Payer: Self-pay | Admitting: Family Medicine

## 2018-03-15 VITALS — BP 160/90 | Temp 98.4°F | Wt 219.0 lb

## 2018-03-15 DIAGNOSIS — M150 Primary generalized (osteo)arthritis: Secondary | ICD-10-CM

## 2018-03-15 DIAGNOSIS — M1990 Unspecified osteoarthritis, unspecified site: Secondary | ICD-10-CM

## 2018-03-15 DIAGNOSIS — M159 Polyosteoarthritis, unspecified: Secondary | ICD-10-CM

## 2018-03-15 DIAGNOSIS — F1190 Opioid use, unspecified, uncomplicated: Secondary | ICD-10-CM

## 2018-03-15 DIAGNOSIS — Z5181 Encounter for therapeutic drug level monitoring: Secondary | ICD-10-CM

## 2018-03-15 HISTORY — DX: Unspecified osteoarthritis, unspecified site: M19.90

## 2018-03-15 MED ORDER — OXYCODONE-ACETAMINOPHEN 10-325 MG PO TABS: 1.0000 | ORAL_TABLET | Freq: Four times a day (QID) | ORAL | 0 refills | Status: DC | PRN

## 2018-03-15 MED ORDER — BUPROPION HCL ER (XL) 150 MG PO TB24: 150.0000 mg | ORAL_TABLET | Freq: Every day | ORAL | 2 refills | Status: DC

## 2018-03-15 MED ORDER — OXYCODONE-ACETAMINOPHEN 10-325 MG PO TABS: 1.0000 | ORAL_TABLET | Freq: Four times a day (QID) | ORAL | 0 refills | Status: AC | PRN

## 2018-03-15 NOTE — Progress Notes (Signed)
   Subjective:    Patient ID: Melanie Mcdonald, female    DOB: [birth year], 84 y.o.   MRN: [id number]  HPI Here for pain management. She is doing well as far as pain goes.  Indication for chronic opioid: osteoarthritis  Medication and dose: Percocet 10-325  # pills per month: 120 Last UDS date: 03-15-18 Opioid Treatment Agreement signed (Y/N): 03-15-18 Opioid Treatment Agreement last reviewed with patient:  03-15-18 [facility] reviewed this encounter (include red flags):  03-15-18    Review of Systems  Constitutional: Negative.   Respiratory: Negative.   Cardiovascular: Negative.   Musculoskeletal: Positive for arthralgias.  Neurological: Negative.        Objective:   Physical Exam  Constitutional: She is oriented to person, place, and time. She appears well-developed and well-nourished.  Walks slowly with a walker   Cardiovascular: Normal rate, regular rhythm, normal heart sounds and intact distal pulses.  Pulmonary/Chest: Effort normal and breath sounds normal.  Neurological: She is alert and oriented to person, place, and time.          Assessment & Plan:  Pain management. Meds were refilled.  Alysia Penna, MD

## 2018-03-20 ENCOUNTER — Telehealth: Payer: Self-pay | Admitting: Family Medicine

## 2018-03-20 NOTE — Telephone Encounter (Signed)
PA for Zolpidem tratate approved 01/03/18-10/01/18.

## 2018-03-20 NOTE — Telephone Encounter (Signed)
Copied from [place]. Topic: Quick Communication - See Telephone Encounter >> Mar 20, 2018  1:21 PM Ivar Drape wrote: CRM for notification. See Telephone encounter for: 03/20/18.

## 2018-03-22 DIAGNOSIS — I87331 Chronic venous hypertension (idiopathic) with ulcer and inflammation of right lower extremity: Secondary | ICD-10-CM | POA: Diagnosis not present

## 2018-03-25 LAB — CUP PACEART REMOTE DEVICE CHECK
Implantable Pulse Generator Implant Date: 20181022
MDC IDC SESS DTM: 20190531003843

## 2018-03-26 ENCOUNTER — Telehealth: Payer: Self-pay | Admitting: *Deleted

## 2018-03-26 NOTE — Telephone Encounter (Signed)
PA initiated by phone for [facility] 14 day reader device via phone at [phone]. Unfortunately phone call was disconnected. Will attempt call back at another time.

## 2018-03-28 ENCOUNTER — Telehealth: Payer: Self-pay

## 2018-03-28 NOTE — Telephone Encounter (Signed)
Pt called saying she wants to change  her current Cardiologist and wanted Dr. Gwenlyn Found to be her new Cardiologist. I told her I will send both Providers a phone note and the scheduler a note.

## 2018-03-28 NOTE — Telephone Encounter (Signed)
That is fine with me.

## 2018-04-02 ENCOUNTER — Ambulatory Visit (INDEPENDENT_AMBULATORY_CARE_PROVIDER_SITE_OTHER): Payer: Medicare Other | Admitting: *Deleted

## 2018-04-02 DIAGNOSIS — I6340 Cerebral infarction due to embolism of unspecified cerebral artery: Secondary | ICD-10-CM | POA: Diagnosis not present

## 2018-04-03 MED ORDER — FREESTYLE LIBRE 14 DAY READER DEVI: 1.0000 | 0 refills | Status: DC

## 2018-04-03 NOTE — Telephone Encounter (Signed)
I wrote a letter documenting her need for this device.

## 2018-04-03 NOTE — Telephone Encounter (Signed)
Called to follow-up. PA was denied. Requested device is not a Part D eligible product and is not covered under the prescription drug plan, but may be covered by [employer] (a DME company) through her medical benefit.   The [employer] at [phone]. The need demographics, prescription, and chart notes supporting need for the device to process claim. This can be faxed to [phone]. I have demographics and rx ready to go.   Can you provide info to support need for this device for her? Thanks!

## 2018-04-03 NOTE — Progress Notes (Signed)
[facility] Summary Report / Loop Recorder 

## 2018-04-05 ENCOUNTER — Encounter: Payer: Self-pay | Admitting: *Deleted

## 2018-04-05 ENCOUNTER — Encounter (HOSPITAL_BASED_OUTPATIENT_CLINIC_OR_DEPARTMENT_OTHER): Payer: Medicare Other | Attending: Internal Medicine

## 2018-04-05 DIAGNOSIS — I87331 Chronic venous hypertension (idiopathic) with ulcer and inflammation of right lower extremity: Secondary | ICD-10-CM | POA: Insufficient documentation

## 2018-04-05 DIAGNOSIS — E11622 Type 2 diabetes mellitus with other skin ulcer: Secondary | ICD-10-CM | POA: Diagnosis not present

## 2018-04-05 DIAGNOSIS — I110 Hypertensive heart disease with heart failure: Secondary | ICD-10-CM | POA: Diagnosis not present

## 2018-04-05 DIAGNOSIS — I509 Heart failure, unspecified: Secondary | ICD-10-CM | POA: Insufficient documentation

## 2018-04-05 DIAGNOSIS — L97212 Non-pressure chronic ulcer of right calf with fat layer exposed: Secondary | ICD-10-CM | POA: Diagnosis not present

## 2018-04-05 NOTE — Telephone Encounter (Signed)
I typed up Dr. Barbie Banner handwritten letter and faxed it, along with demographics, and rx for Freestyle Libre to [employer]. Fax confirmation received.

## 2018-04-08 NOTE — Telephone Encounter (Addendum)
[place] to ensure request was received. They asked that I re-fax info to [phone]. This was done, awaiting fax confirmation. Once they process the request, they will contact pt's insurance to determine pricing and then will contact the patient by phone to verify that she wants to proceed with the order. This process can take 2-3 weeks.  I called the patient and made her aware of the above and also that [place] will be calling her from an 801 area code. Patient requested that if we need to contact her again and she does not answer to please leave a detailed message as she often screens calls to avoid telemarketers.

## 2018-04-08 NOTE — Telephone Encounter (Signed)
Fax confirmation received. 

## 2018-04-11 ENCOUNTER — Encounter (HOSPITAL_BASED_OUTPATIENT_CLINIC_OR_DEPARTMENT_OTHER): Payer: Medicare Other

## 2018-04-11 DIAGNOSIS — I509 Heart failure, unspecified: Secondary | ICD-10-CM | POA: Insufficient documentation

## 2018-04-11 DIAGNOSIS — I890 Lymphedema, not elsewhere classified: Secondary | ICD-10-CM | POA: Insufficient documentation

## 2018-04-11 DIAGNOSIS — E1151 Type 2 diabetes mellitus with diabetic peripheral angiopathy without gangrene: Secondary | ICD-10-CM | POA: Insufficient documentation

## 2018-04-11 DIAGNOSIS — L03115 Cellulitis of right lower limb: Secondary | ICD-10-CM | POA: Insufficient documentation

## 2018-04-11 DIAGNOSIS — L97812 Non-pressure chronic ulcer of other part of right lower leg with fat layer exposed: Secondary | ICD-10-CM | POA: Insufficient documentation

## 2018-04-11 DIAGNOSIS — I110 Hypertensive heart disease with heart failure: Secondary | ICD-10-CM | POA: Insufficient documentation

## 2018-04-11 DIAGNOSIS — I87331 Chronic venous hypertension (idiopathic) with ulcer and inflammation of right lower extremity: Secondary | ICD-10-CM | POA: Insufficient documentation

## 2018-04-15 ENCOUNTER — Telehealth: Payer: Self-pay | Admitting: Family Medicine

## 2018-04-15 NOTE — Telephone Encounter (Signed)
[place] to follow-up. They did receive order and have tried to contact patient x3.  Called patient to follow-up and she states she just returned the call to them and left a message about an hour ago. Informed patient to await call back.  Callback number for the agent handling her order, [name], is [phone]. General callback number is [phone] ext [phone].

## 2018-04-15 NOTE — Telephone Encounter (Signed)
Sent to PCP as an FYI  

## 2018-04-15 NOTE — Telephone Encounter (Signed)
Copied from [place] [phone]. Topic: General - Other >> Apr 15, 2018 10:20 AM Margot Ables wrote: Reason for CRM: calling to notify that pt had a fall over the weekend, no injuries reported.

## 2018-04-15 NOTE — Telephone Encounter (Signed)
Noted  

## 2018-04-18 ENCOUNTER — Other Ambulatory Visit: Payer: Self-pay

## 2018-04-18 ENCOUNTER — Telehealth: Payer: Self-pay | Admitting: Family Medicine

## 2018-04-18 NOTE — Telephone Encounter (Signed)
Copied from [place] [phone]. Topic: Quick Communication - See Telephone Encounter >> Apr 18, 2018  1:55 PM Gardiner Ramus wrote: CRM for notification. See Telephone encounter for: 04/18/18. Roxanne from [facility] called and needed to confirm  certificate of medical necessity and notes. Please advise [phone]

## 2018-04-19 NOTE — Telephone Encounter (Signed)
I have not seen any forms like this

## 2018-04-19 NOTE — Telephone Encounter (Signed)
I know we had a fax for this pt but it was for Dr. Buddy Duty her DM doctor.   Sent to PCP did you file out or get and forms from [facility] that you can recall?

## 2018-04-23 NOTE — Telephone Encounter (Signed)
Called Debbie w/ [employer] and left message to return call.

## 2018-04-23 NOTE — Telephone Encounter (Signed)
Called Debbie and left message to refax documents.

## 2018-04-23 NOTE — Telephone Encounter (Signed)
No we have NOT. That would be great if you can do that!  Thanks [employer]

## 2018-04-23 NOTE — Telephone Encounter (Signed)
Spoke w/ [name]. They have talked to the patient and now need the certificate of medical necessity from PCP. This form was faxed on 04/18/18.  Melanie Mcdonald, have you received this form? If not, I will request that they resend it.

## 2018-04-24 ENCOUNTER — Other Ambulatory Visit: Payer: Self-pay | Admitting: *Deleted

## 2018-04-24 ENCOUNTER — Other Ambulatory Visit: Payer: Self-pay | Admitting: Family Medicine

## 2018-04-24 NOTE — Telephone Encounter (Signed)
Copied from [place] [phone]. Topic: Quick Communication - Rx Refill/Question >> Apr 24, 2018 12:20 PM Synthia Innocent wrote: Medication: pramipexole (MIRAPEX) 1 MG tablet   Has the patient contacted their pharmacy? Yes.   (Agent: If no, request that the patient contact the pharmacy for the refill.) (Agent: If yes, when and what did the pharmacy advise?)  Preferred Pharmacy (with phone number or street name): [facility]  Agent: Please be advised that RX refills may take up to 3 business days. We ask that you follow-up with your pharmacy.

## 2018-04-24 NOTE — Telephone Encounter (Signed)
Fax has been placed in RED folder for PCP to review

## 2018-04-25 NOTE — Telephone Encounter (Signed)
This form is about glucose test strips. Once again I do NOT treat her diabetes. Dr. Buddy Duty does, and these should go to him

## 2018-04-25 NOTE — Telephone Encounter (Signed)
Mirapex refill Last OV:02/26/18 Last refill:12/11/17 Historical provider PCP:[facility]: [place], [place] [phone] (Phone) [phone] (Fax)

## 2018-04-26 ENCOUNTER — Ambulatory Visit: Payer: Medicare Other | Admitting: Neurology

## 2018-04-26 ENCOUNTER — Encounter: Payer: Self-pay | Admitting: Neurology

## 2018-04-26 VITALS — BP 128/64 | HR 80 | Ht 69.0 in | Wt 213.0 lb

## 2018-04-26 DIAGNOSIS — I6349 Cerebral infarction due to embolism of other cerebral artery: Secondary | ICD-10-CM

## 2018-04-26 DIAGNOSIS — E0842 Diabetes mellitus due to underlying condition with diabetic polyneuropathy: Secondary | ICD-10-CM

## 2018-04-26 DIAGNOSIS — I87331 Chronic venous hypertension (idiopathic) with ulcer and inflammation of right lower extremity: Secondary | ICD-10-CM | POA: Diagnosis not present

## 2018-04-26 MED ORDER — PRAMIPEXOLE DIHYDROCHLORIDE 1 MG PO TABS: 5.0000 mg | ORAL_TABLET | Freq: Every day | ORAL | 5 refills | Status: DC

## 2018-04-26 NOTE — Telephone Encounter (Signed)
Will fax forms to Dr. Cindra Eves office.

## 2018-04-26 NOTE — Telephone Encounter (Signed)
The reason is that this is the dose required to control her restless legs

## 2018-04-26 NOTE — Progress Notes (Signed)
NEUROLOGY FOLLOW UP OFFICE NOTE  Melanie Mcdonald [id number]  DOB: 10-13-1932  HISTORY OF PRESENT ILLNESS: I had the pleasure of seeing Melanie Mcdonald in follow-up in the neurology clinic on 04/26/2018.  The patient was last seen 6 months ago after she had a stroke in November 2018. She was initially followed for dizziness, loss of consciousness, headaches, and memory problems. In November 2018, she had word-finding problems, weakness, and confusion, MRI brain showed embolic-appearing strokes in the left cerebellar hemisphere, left thalamus, and left parietal subcortical white matter. Her echo showed an EF of 60-65%, left atrium severely dilated. She has a loop recorder, no events noted. She was discharged home on Plavix, but again reports that she is not taking it daily. She feels that it affects her memory. She reports having a few episodes where she cannot get her words out, but has an agreement with her son that if symptoms do not resolve within 30 minutes, then they go to the ER. So far they have not lasted that long. She has had several falls, reporting "2 good ones" without her walker, she tripped in the laundry room 3 months ago at night and was on the floor for 7 hours until her son found her. Last fall was 2 weeks ago, she had a bruise on her forehead, arm, and abdomen. She reports memory difficulties and will be getting medications in a PillPak soon, but states she takes her insulin regularly. She does not check her sugar levels at all. She has noticed that when she turns her head to the right side, if feels like her eyes are locked. She denies any headaches, dizziness, focal numbness/tingling/weakness.   HPI: This is an 84 yo RH woman who presented in April 2015 with a 2-year history of dizziness. She recalls feeling dizzy at church with a headache and was brought home by friends. That afternoon, she fell into a ditch near her home and was apparently there for hours because she could not move her  legs. She was able to dig holes in the wall and slowly crawl out to get a passerby's attention. She was brought to the hospital and developed ?cellulitis in her left leg, requiring a boot on her left leg. As she was going out the door one time, she fell and injured her other leg. Since then,she stated having dizziness described as "like I cannot keep my balance." There is occasional nausea and vomiting. She reports horizontal diplopia when tired, especially at night when driving. She denies any dysarthria, dysphagia, focal numbness/tingling/weakness, reporting that both feet are numb. She started having "blackout spells" where she would wake up on the floor with no recollection of events, no prior warning symptoms. Initially she was having up to 2 episodes a day. She has hit her kitchen cabinet twice and broke it. She denies any associated dizziness or focal weakness around the time of passing out. She started having headaches in the occipital region occurring daily. She reports episodes of passing out stopped after she self-discontinued her medications. Last episode occurred the week prior to her initial visit at the end of March 2015 when she woke up on the floor.   Diagnostic Data: I personally reviewed MRI brain/MRA head 12/2013 which were unremarkable, no evidence of acute or prior ischemia, there was mild intracranial atherosclerotic type changes without medium or large size vessel significant stenosis or occlusion. Her routine EEG was normal. Echo done in March 2013 showed increased LV wall thickness in a pattern  of severe LVH, EF 55-60%, mild mitral stenosis, left atrium moderately dilated.  PAST MEDICAL HISTORY: Past Medical History:  Diagnosis Date  . ANEMIA 01/30/2008  . ARTHRITIS 12/11/2007  . BACK PAIN, CHRONIC 06/23/2008  . BREAST CYST, RIGHT 12/17/2007  . Cancer ([place])   . CHF (congestive heart failure) ([place])   . Colon polyps    FRAGMENTS OF HYPERPLASTIC POLYP  . CONTACT DERMATITIS 03/10/2009   . COPD (chronic obstructive pulmonary disease) ([place])   . CORONARY ARTERY DISEASE 03/01/2007   had a normal Myoview stress test 07-13-11  . CYSTOCELE WITHOUT MENTION UTERINE PROLAPSE LAT 09/12/2010  . DEGENERATIVE JOINT DISEASE 08/07/2007  . DEPRESSION 03/01/2007  . DIABETES MELLITUS, TYPE II 08/26/2008   sees Dr. Buddy Duty   . Diverticulosis of colon (without mention of hemorrhage)   . Esophageal reflux 11/06/2007  . HYPERKERATOSIS 06/02/2009  . HYPERLIPIDEMIA 08/26/2008  . HYPERTENSION 03/01/2007  . Irritable bowel syndrome 01/26/2009  . LABYRINTHITIS 11/03/2009  . 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 NEPHROLITHIASIS 04/29/2009  . OBESITY 03/01/2007  . PERIPHERAL VASCULAR DISEASE 01/26/2009  . Stroke ([place])   . SYNCOPE 08/26/2008   had brain MRI on 06-28-12 showing only chronic microvascular ischemia and atrophy   . TRANSIENT ISCHEMIC ATTACK 10/20/2009   had normal brain MRA with patent vertebrals and carotids 06-28-12    MEDICATIONS: Current Outpatient Medications on File Prior to Visit  Medication Sig Dispense Refill  . clopidogrel (PLAVIX) 75 MG tablet Take 1 tablet (75 mg total) by mouth daily. 30 tablet 11  . furosemide (LASIX) 40 MG tablet Take 1 tablet (40 mg total) by mouth 2 (two) times daily with breakfast and lunch. 60 tablet 11  . HUMALOG KWIKPEN 100 UNIT/ML KiwkPen Inject 24 Units into the skin 3 (three) times daily before meals.  6  . hydrocortisone (CORTEF) 5 MG tablet Take 5 mg by mouth at bedtime.     . insulin degludec (TRESIBA FLEXTOUCH) 100 UNIT/ML SOPN FlexTouch Pen Inject 70 Units into the skin every morning.     Marland Kitchen ketoconazole (NIZORAL) 2 % cream Apply 1 application topically 2 (two) times daily as needed for irritation (between bikini lines).    . LORazepam (ATIVAN) 1 MG tablet Take 1 mg by mouth every 6 (six) hours as needed for anxiety.    Marland Kitchen losartan (COZAAR) 50 MG tablet Take 50 mg by mouth daily.  3  . ondansetron (ZOFRAN) 8 MG tablet Take 1 tablet (8 mg  total) by mouth every 8 (eight) hours as needed for nausea or vomiting. 60 tablet 5  . [START ON 05/15/2018] oxyCODONE-acetaminophen (PERCOCET) 10-325 MG tablet Take 1 tablet by mouth every 6 (six) hours as needed for pain. 120 tablet 0  . Potassium Chloride ER 20 MEQ TBCR Take 20 mEq by mouth daily. 30 tablet 0  . pramipexole (MIRAPEX) 1 MG tablet Take 5 mg by mouth at bedtime.    Marland Kitchen spironolactone (ALDACTONE) 25 MG tablet Take 1 tablet (25 mg total) by mouth daily. 90 tablet 1  . triamcinolone cream (KENALOG) 0.1 % Apply 1 application topically 2 (two) times daily. 45 g 2  . zolpidem (AMBIEN) 10 MG tablet Take 1 tablet (10 mg total) by mouth at bedtime as needed for sleep. 30 tablet 5   No current facility-administered medications on file prior to visit.     ALLERGIES: Allergies  Allergen Reactions  . Lyrica [Pregabalin] Other (See Comments)    Suicidal   . Aspirin Other (See Comments)  Abdominal pain  . Ciprofloxacin Nausea And Vomiting    Made pt very sick on stomach  . Clarithromycin Other (See Comments)    Made stomach "burn"  . Doxycycline Itching  . Erythromycin Nausea And Vomiting  . Lisinopril Cough  . Macrobid [Nitrofurantoin Monohyd Macro] Nausea And Vomiting  . Metolazone Other (See Comments)    Pt stated this made her B/P drop  . Other Nausea And Vomiting    Pt cannot have any of the -mycin(s)  . Penicillins Swelling    From childhood: swelling @ injection site Has patient had a PCN reaction causing immediate rash, facial/tongue/throat swelling, SOB or lightheadedness with hypotension: Yes Has patient had a PCN reaction causing severe rash involving mucus membranes or skin necrosis: No Has patient had a PCN reaction that required hospitalization: No Has patient had a PCN reaction occurring within the last 10 years: No If all of the above answers are "NO", then may proceed with Cephalosporin use.   . Sulfonamide Derivatives Nausea And Vomiting  . Valtrex  [Valacyclovir Hcl] Nausea Only    FAMILY HISTORY: Family History  Problem Relation Age of Onset  . Lupus Mother   . COPD Father     SOCIAL HISTORY: Social History   Socioeconomic History  . Marital status: Widowed    Spouse name: Not on file  . Number of children: Not on file  . Years of education: Not on file  . Highest education level: Not on file  Occupational History  . Not on file  Social Needs  . Financial resource strain: Not on file  . Food insecurity:    Worry: Not on file    Inability: Not on file  . Transportation needs:    Medical: Not on file    Non-medical: Not on file  Tobacco Use  . Smoking status: Never Smoker  . Smokeless tobacco: Never Used  Substance and Sexual Activity  . Alcohol use: No    Alcohol/week: 0.0 oz  . Drug use: No    Types: Oxycodone  . Sexual activity: Never    Birth control/protection: Surgical  Lifestyle  . Physical activity:    Days per week: Not on file    Minutes per session: Not on file  . Stress: Not on file  Relationships  . Social connections:    Talks on phone: Not on file    Gets together: Not on file    Attends religious service: Not on file    Active member of club or organization: Not on file    Attends meetings of clubs or organizations: Not on file    Relationship status: Not on file  . Intimate partner violence:    Fear of current or ex partner: Not on file    Emotionally abused: Not on file    Physically abused: Not on file    Forced sexual activity: Not on file  Other Topics Concern  . Not on file  Social History Narrative  . Not on file    REVIEW OF SYSTEMS: Constitutional: No fevers, chills, or sweats, + generalized fatigue,no change in appetite Eyes: No visual changes, double vision, eye pain Ear, nose and throat: No hearing loss, ear pain, nasal congestion, sore throat Cardiovascular: No chest pain, palpitations Respiratory:  + shortness of breath at rest or with exertion, no  wheezes GastrointestinaI: No nausea, vomiting, diarrhea, abdominal pain, fecal incontinence Genitourinary:  No dysuria, urinary retention or frequency Musculoskeletal:  + neck pain, back pain Integumentary: No rash,  pruritus, skin lesions Neurological: as above Psychiatric: + depression, insomnia, anxiety Endocrine: No palpitations, fatigue, diaphoresis, mood swings, change in appetite, change in weight, increased thirst Hematologic/Lymphatic:  No anemia, purpura, petechiae. Allergic/Immunologic: no itchy/runny eyes, nasal congestion, recent allergic reactions, rashes  PHYSICAL EXAM: Vitals:   04/26/18 1010  BP: 128/64  Pulse: 80  SpO2: 95%   General: No acute distress, flat affect Head:  Normocephalic/atraumatic Neck: supple, no paraspinal tenderness, full range of motion Heart:  Regular rate and rhythm Lungs:  Clear to auscultation bilaterally Back: No paraspinal tenderness Skin/Extremities: No rash, edema Neurological Exam: alert and oriented to person, place, and time. No aphasia or dysarthria. Fund of knowledge is appropriate. Recent and remote memory are intact. 3/3 delayed recall. Attention and concentration are normal. Able to name objects and repeat phrases. Cranial nerves: Pupils equal, round, reactive to light.Extraocular movements intact with no nystagmus. Visual fields full. Facial sensation intact. No facial asymmetry. Tongue, uvula, palate midline. Motor: Bulk and tone normal, muscle strength 5/5 throughout with no pronator drift. Decreased sensation in both LE. Deep Tendon Reflexes: +2 both UE, unable to elicit bilateral patella and ankle jerks. no ankle clonus. Plantar responses: downgoing bilaterally. Cerebellar: no incoordination on finger to nose Gait: slow and cautious with walker, no ataxia. Romberg mild sway. Tremor: none  IMPRESSION: This is an 84 yo RH woman with a history of hypertension, diabetes, RLS, initially seen a year ago for dizziness, headaches,  memory loss, and episodes of loss of consciousness. No further episodes of loss of consciousness. She had embolic-appearing strokes in November 2018 when she had word-finding difficulties, weakness, and confusion. Loop recorder so far has not captured any atrial fibrillation. She reports some episodes of word-finding difficulties that do not last more than 30 minutes. She does not take Plavix regularly and we again had an extensive discussion about strokes and secondary stroke prevention with Plavix and control of vascular risk factors. She does not monitor her sugars, last HbA1c in October 2018 was 10.9. She has Home Health, we discussed continuing to get more help at home, especially since she is forgetting medications. She has neuropathy with frequent falls, and was advised to use her walker at all times. She will follow-up in 6 months and knows to go to the ER immediately for any sudden changes in symptoms.  Thank you for allowing me to participate in her care.  Please do not hesitate to call for any questions or concerns.  The duration of this appointment visit was 25 minutes of face-to-face time with the patient.  Greater than 50% of this time was spent in counseling, explanation of diagnosis, planning of further management, and coordination of care.   Ellouise Newer, M.D.   CC: Dr. Sarajane Jews

## 2018-04-26 NOTE — Telephone Encounter (Signed)
Sent to PCP needs a reason to Override in order for Rx to be sent.

## 2018-04-26 NOTE — Patient Instructions (Signed)
1. Start taking all your medications regularly, especially the Plavix 2. Talk to Dr. Buddy Duty about having your sugars checked regularly 3. Continue with working with [place], getting more help at home 4. Use your walker at all times 5. For any sudden changes in symptoms, go to ER immediately 6. Follow-up in 6 months, call for any changes

## 2018-04-26 NOTE — Telephone Encounter (Signed)
Call in #[phone] with 5 rf 

## 2018-04-26 NOTE — Telephone Encounter (Signed)
Sent to PCP to advise 

## 2018-05-01 ENCOUNTER — Telehealth: Payer: Self-pay | Admitting: Neurology

## 2018-05-01 ENCOUNTER — Other Ambulatory Visit: Payer: Self-pay | Admitting: Family Medicine

## 2018-05-01 ENCOUNTER — Telehealth: Payer: Self-pay | Admitting: Family Medicine

## 2018-05-01 DIAGNOSIS — E1151 Type 2 diabetes mellitus with diabetic peripheral angiopathy without gangrene: Secondary | ICD-10-CM | POA: Diagnosis not present

## 2018-05-01 DIAGNOSIS — F319 Bipolar disorder, unspecified: Secondary | ICD-10-CM

## 2018-05-01 DIAGNOSIS — K589 Irritable bowel syndrome without diarrhea: Secondary | ICD-10-CM

## 2018-05-01 DIAGNOSIS — I2510 Atherosclerotic heart disease of native coronary artery without angina pectoris: Secondary | ICD-10-CM

## 2018-05-01 DIAGNOSIS — I5032 Chronic diastolic (congestive) heart failure: Secondary | ICD-10-CM

## 2018-05-01 DIAGNOSIS — I6931 Attention and concentration deficit following cerebral infarction: Secondary | ICD-10-CM

## 2018-05-01 DIAGNOSIS — Z7902 Long term (current) use of antithrombotics/antiplatelets: Secondary | ICD-10-CM

## 2018-05-01 DIAGNOSIS — E785 Hyperlipidemia, unspecified: Secondary | ICD-10-CM

## 2018-05-01 DIAGNOSIS — E1142 Type 2 diabetes mellitus with diabetic polyneuropathy: Secondary | ICD-10-CM

## 2018-05-01 DIAGNOSIS — I110 Hypertensive heart disease with heart failure: Secondary | ICD-10-CM | POA: Diagnosis not present

## 2018-05-01 DIAGNOSIS — G8929 Other chronic pain: Secondary | ICD-10-CM

## 2018-05-01 DIAGNOSIS — Z79891 Long term (current) use of opiate analgesic: Secondary | ICD-10-CM

## 2018-05-01 DIAGNOSIS — M549 Dorsalgia, unspecified: Secondary | ICD-10-CM

## 2018-05-01 DIAGNOSIS — D649 Anemia, unspecified: Secondary | ICD-10-CM

## 2018-05-01 DIAGNOSIS — Z794 Long term (current) use of insulin: Secondary | ICD-10-CM

## 2018-05-01 DIAGNOSIS — J449 Chronic obstructive pulmonary disease, unspecified: Secondary | ICD-10-CM

## 2018-05-01 DIAGNOSIS — Z8673 Personal history of transient ischemic attack (TIA), and cerebral infarction without residual deficits: Secondary | ICD-10-CM

## 2018-05-01 NOTE — Telephone Encounter (Signed)
Pharmacy called needing refill on 75 mg of Plavix. Thanks

## 2018-05-01 NOTE — Telephone Encounter (Signed)
Copied from [place] [id number]. Topic: Quick Communication - Rx Refill/Question >> May 01, 2018  3:21 PM Margot Ables wrote: Medication: following up on RX request faxed 04/27/18 to [phone] Pt needing refills on Wellbutrin, furosemide, Humalog, losartan, meclizine, ondansetron, pen needles, colchicine, tresiba, and Ambien Please advise. Option 3 for verbal order or ok to e-scribe new orders

## 2018-05-01 NOTE — Telephone Encounter (Signed)
Copied from [place] [id number]. Topic: General - Other >> May 01, 2018 11:12 AM Margot Ables wrote: Reason for CRM: calling to f/u on request faxed 04/23/18 to [phone]. Needing signed CMN and copy of most recent OV notes.  She will refax today. Please call to notify received.

## 2018-05-01 NOTE — Telephone Encounter (Signed)
Placed in Dr. Barbie Banner RED folder

## 2018-05-01 NOTE — Telephone Encounter (Signed)
Done, in [facility] folder

## 2018-05-02 ENCOUNTER — Other Ambulatory Visit: Payer: Self-pay

## 2018-05-02 DIAGNOSIS — I5033 Acute on chronic diastolic (congestive) heart failure: Secondary | ICD-10-CM

## 2018-05-02 DIAGNOSIS — G4730 Sleep apnea, unspecified: Secondary | ICD-10-CM

## 2018-05-02 DIAGNOSIS — I10 Essential (primary) hypertension: Secondary | ICD-10-CM

## 2018-05-02 MED ORDER — CLOPIDOGREL BISULFATE 75 MG PO TABS: 75.0000 mg | ORAL_TABLET | Freq: Every day | ORAL | 1 refills | Status: DC

## 2018-05-02 NOTE — Telephone Encounter (Signed)
Last OV 03/15/2018   Sent to PCP to advise

## 2018-05-02 NOTE — Telephone Encounter (Signed)
Paper work has been faxed with pt's most resent OV summer notes.

## 2018-05-02 NOTE — Telephone Encounter (Signed)
Plavix previously sent to [facility]. RX sent to [facility] pharmacy.

## 2018-05-02 NOTE — Telephone Encounter (Signed)
Called patient to make sure she is still currently taking these medication and to verify pharmacy. Patient stated that she did not have her medications handy and that has an appointment tomorrow, she would discuss them with Dr. Sarajane Jews at the visit.  Will send to Dr. Sarajane Jews as Juluis Rainier   See refill encounter notes for 05/02/18

## 2018-05-02 NOTE — Telephone Encounter (Signed)
Spoke with Sunday Spillers, [facility] [place], regarding pt request; she will route to [place] to find out the status

## 2018-05-02 NOTE — Telephone Encounter (Signed)
-   I do not see where the patient has ever had Wellbutrin  -Humalog: Historical med, last filled 10/03/17  -Losartan: not on current med list, Losartan Potassium last filled by you 07/13/17, refilled 10/21/17 & 02/07/18 but discontinued, reason: Error  -Meclizine: not on current med list, Last filled 05/14/14, discontinue reason: stop taking at discharge  - no Rx for pen needle on current med list or historical med list  -Colchicine: not on current med listed, filled 04/26/18, discontinue reason Error   -Tyler Aas: Historical med   -ambien last filled 02/26/18  -furosemide: last fill 02/01/18 with 11RF, went to [facility].  -Ondansetron: last fill 12/27/17 with 5RF was [facility]

## 2018-05-02 NOTE — Telephone Encounter (Signed)
Noted  

## 2018-05-03 ENCOUNTER — Telehealth: Payer: Self-pay | Admitting: Family Medicine

## 2018-05-03 ENCOUNTER — Encounter: Payer: Self-pay | Admitting: Family Medicine

## 2018-05-03 ENCOUNTER — Ambulatory Visit: Payer: Medicare Other | Admitting: Family Medicine

## 2018-05-03 VITALS — BP 122/60 | HR 90 | Temp 98.0°F | Ht 69.0 in | Wt 212.8 lb

## 2018-05-03 DIAGNOSIS — E1165 Type 2 diabetes mellitus with hyperglycemia: Secondary | ICD-10-CM

## 2018-05-03 DIAGNOSIS — I10 Essential (primary) hypertension: Secondary | ICD-10-CM

## 2018-05-03 DIAGNOSIS — Z794 Long term (current) use of insulin: Secondary | ICD-10-CM

## 2018-05-03 DIAGNOSIS — G2581 Restless legs syndrome: Secondary | ICD-10-CM

## 2018-05-03 DIAGNOSIS — M7989 Other specified soft tissue disorders: Secondary | ICD-10-CM

## 2018-05-03 DIAGNOSIS — R531 Weakness: Secondary | ICD-10-CM

## 2018-05-03 DIAGNOSIS — R2681 Unsteadiness on feet: Secondary | ICD-10-CM

## 2018-05-03 MED ORDER — ZOLPIDEM TARTRATE 10 MG PO TABS: 10.0000 mg | ORAL_TABLET | Freq: Every evening | ORAL | 5 refills | Status: DC | PRN

## 2018-05-03 NOTE — Progress Notes (Signed)
   Subjective:    Patient ID: Melanie Mcdonald, female    DOB: Feb 26, 1933, 84 y.o.   MRN: [id number]  HPI Here to follow up. Her BP has been stable. Her glucoses have been labile, running from 140 to 200 fasting. She has applied to get a [place] monitor. Her restless legs are stable. She finds it harder and harder to walk, due to leg weakness and her chronic dizziness. She uses her walker at all times. Her legs swell every day and Lasix only helps to a degree. She cannot wear TED hose because she cannot get them on by herself.    Review of Systems  Constitutional: Positive for fatigue.  Respiratory: Negative.   Cardiovascular: Positive for leg swelling. Negative for chest pain and palpitations.  Neurological: Positive for dizziness and weakness.       Objective:   Physical Exam  Constitutional: She is oriented to person, place, and time. She appears well-developed and well-nourished.  Cardiovascular: Normal rate, regular rhythm, normal heart sounds and intact distal pulses.  Pulmonary/Chest: Effort normal and breath sounds normal. No stridor. No respiratory distress. She has no wheezes. She has no rales.  Musculoskeletal:  3+ edema in both lower legs and ankles  Neurological: She is alert and oriented to person, place, and time.          Assessment & Plan:  Her HTN is stable. Her diabetes is moderately controlled. We will order some PT to work with her to help with walking. Her restless legs and insomnia are stable.  Alysia Penna, MD

## 2018-05-03 NOTE — Telephone Encounter (Signed)
[name] they have not received fax. Please re-fax to [phone]. Call back if needed [phone].  Thanks!

## 2018-05-03 NOTE — Telephone Encounter (Signed)
Copied from [place] [phone]. Topic: Quick Communication - See Telephone Encounter >> May 03, 2018  5:25 PM Neva Seat wrote: [place] w/ [facility] [phone]  Needing more info further assist the pt asap.

## 2018-05-05 ENCOUNTER — Other Ambulatory Visit: Payer: Self-pay | Admitting: Family Medicine

## 2018-05-06 ENCOUNTER — Telehealth: Payer: Self-pay | Admitting: Family Medicine

## 2018-05-06 ENCOUNTER — Ambulatory Visit (INDEPENDENT_AMBULATORY_CARE_PROVIDER_SITE_OTHER): Payer: Medicare Other | Admitting: *Deleted

## 2018-05-06 ENCOUNTER — Telehealth: Payer: Self-pay | Admitting: Neurology

## 2018-05-06 DIAGNOSIS — I6340 Cerebral infarction due to embolism of unspecified cerebral artery: Secondary | ICD-10-CM

## 2018-05-06 MED ORDER — CLOPIDOGREL BISULFATE 75 MG PO TABS: 75.0000 mg | ORAL_TABLET | Freq: Every day | ORAL | 11 refills | Status: DC

## 2018-05-06 NOTE — Telephone Encounter (Signed)
We no longer have this form

## 2018-05-06 NOTE — Telephone Encounter (Signed)
LMOM making pt aware of Rx

## 2018-05-06 NOTE — Telephone Encounter (Signed)
Rx for Plavix 75mg  sent to [facility].

## 2018-05-06 NOTE — Telephone Encounter (Signed)
Sent to PCP for the OK for verbal orders  

## 2018-05-06 NOTE — Telephone Encounter (Signed)
Patient called and left a voicemail for a prescription refill request. Did not state which medication it was for.

## 2018-05-06 NOTE — Telephone Encounter (Signed)
Copied from [place] [phone]. Topic: Quick Communication - See Telephone Encounter >> May 06, 2018 12:05 PM Synthia Innocent wrote: CRM for notification. See Telephone encounter for: 05/06/18. [facility] calling requesting verbal order for PT eval. Please advise

## 2018-05-07 ENCOUNTER — Telehealth: Payer: Self-pay | Admitting: Family Medicine

## 2018-05-07 NOTE — Telephone Encounter (Signed)
Copied from [place] [id number]. Topic: Quick Communication - Rx Refill/Question >> May 07, 2018  3:02 PM Burchel, Abbi R wrote: Medication: pramipexole (MIRAPEX) 1 MG tablet   Preferred Pharmacy: [facility] [place], [address] [place] [address] Phone: [phone] Fax: [phone]    Pt was advised that RX refills may take up to 3 business days. Pt is experiencing restless leg syndrome and is out of this medication.

## 2018-05-07 NOTE — Telephone Encounter (Signed)
Copied from [place] [phone]. Topic: Quick Communication - See Telephone Encounter >> May 03, 2018  5:25 PM Neva Seat wrote: [place] w/ [place] [phone]  Needing more info further assist the pt asap.

## 2018-05-07 NOTE — Telephone Encounter (Signed)
See 03/26/18 phone note. This is a duplicate.

## 2018-05-07 NOTE — Telephone Encounter (Signed)
Please okay these orders  ?

## 2018-05-07 NOTE — Progress Notes (Signed)
[facility] Summary Report / Loop Recorder 

## 2018-05-07 NOTE — Telephone Encounter (Signed)
Returned call to [employer]. She was missing patient's testing frequency, but was able to find it in notes while we were on the phone. This was the last thing she needed to be able to send patient her glucometer. Patient's insurance will cover 100% and [name] is going to notify the patient and ship it out to her today.

## 2018-05-07 NOTE — Telephone Encounter (Signed)
Called and spoke with Caryl Pina and gave the OK for verbal orders

## 2018-05-08 ENCOUNTER — Telehealth: Payer: Self-pay | Admitting: Family Medicine

## 2018-05-08 ENCOUNTER — Encounter: Payer: Self-pay | Admitting: Neurology

## 2018-05-08 NOTE — Telephone Encounter (Signed)
Copied from [place] [id number]. Topic: Quick Communication - Rx Refill/Question >> May 08, 2018 11:56 AM Bea Graff, NT wrote: Medication: HUMALOG KWIKPEN 100 UNIT/ML KiwkPen, meclizine (ANTIVERT) 12.5 MG tablet, ondansetron (ZOFRAN) 8 MG tablet, insulin degludec (TRESIBA FLEXTOUCH) 100 UNIT/ML SOPN FlexTouch Pen and zolpidem (AMBIEN) 10 MG tablet   Has the patient contacted their pharmacy? Yes.   (Agent: If no, request that the patient contact the pharmacy for the refill.) (Agent: If yes, when and what did the pharmacy advise?)  Preferred Pharmacy (with phone number or street name): [place], [place] [phone] (Phone) [phone]       Agent: Please be advised that RX refills may take up to 3 business days. We ask that you follow-up with your pharmacy.

## 2018-05-08 NOTE — Telephone Encounter (Signed)
Refill of:   Antivert KwikPen Tyler Aas   All by historical provider.    [facility]. [phone]

## 2018-05-09 LAB — CUP PACEART REMOTE DEVICE CHECK
Date Time Interrogation Session: 20190703004016
MDC IDC PG IMPLANT DT: 20181022

## 2018-05-09 NOTE — Telephone Encounter (Signed)
Spoke with Melanie Mcdonald, she is going to stay with dr Meda Coffee right now. Follow up scheduled next available with dr Meda Coffee.

## 2018-05-14 NOTE — Telephone Encounter (Signed)
Sent to PCP to advise. Pt was seeing Dr. Buddy Duty but no longer is seeing them will you be willing to refill prescriptions for t the pt?   Ok will these request need to go to Dr. Buddy Duty?

## 2018-05-15 MED ORDER — HUMALOG KWIKPEN 100 UNIT/ML ~~LOC~~ SOPN: 24.0000 [IU] | PEN_INJECTOR | Freq: Three times a day (TID) | SUBCUTANEOUS | 3 refills | Status: DC

## 2018-05-15 MED ORDER — INSULIN DEGLUDEC 100 UNIT/ML ~~LOC~~ SOPN: 70.0000 [IU] | PEN_INJECTOR | Freq: Every morning | SUBCUTANEOUS | 3 refills | Status: DC

## 2018-05-15 MED ORDER — MECLIZINE HCL 12.5 MG PO TABS: 12.5000 mg | ORAL_TABLET | Freq: Three times a day (TID) | ORAL | 3 refills | Status: DC | PRN

## 2018-05-15 NOTE — Telephone Encounter (Signed)
These were sent in  

## 2018-05-18 ENCOUNTER — Other Ambulatory Visit: Payer: Self-pay | Admitting: Cardiology

## 2018-05-18 DIAGNOSIS — I5033 Acute on chronic diastolic (congestive) heart failure: Secondary | ICD-10-CM

## 2018-05-18 DIAGNOSIS — I10 Essential (primary) hypertension: Secondary | ICD-10-CM

## 2018-05-18 DIAGNOSIS — I739 Peripheral vascular disease, unspecified: Secondary | ICD-10-CM

## 2018-05-18 DIAGNOSIS — I2510 Atherosclerotic heart disease of native coronary artery without angina pectoris: Secondary | ICD-10-CM

## 2018-05-20 ENCOUNTER — Telehealth: Payer: Self-pay | Admitting: Family Medicine

## 2018-05-20 NOTE — Telephone Encounter (Signed)
Please okay the orders for this

## 2018-05-20 NOTE — Telephone Encounter (Signed)
Copied from [place] [phone]. Topic: Quick Communication - See Telephone Encounter >> May 20, 2018  9:17 AM Margot Ables wrote: CRM for notification. See Telephone encounter for: 05/20/18. Pt has Freestyle Libra glucose system but doesn't know how to change it. [name] can change for her and educate pt but will need a verbal order or written order. She has secure VM if not able to answer call for VO. Fax # for written order [phone].

## 2018-05-20 NOTE — Telephone Encounter (Signed)
Sent to PCP ?

## 2018-05-20 NOTE — Telephone Encounter (Signed)
Called and spoke with [name]. [name] advised and voiced understanding.

## 2018-05-24 ENCOUNTER — Ambulatory Visit: Payer: Medicare Other | Admitting: Family Medicine

## 2018-05-24 ENCOUNTER — Encounter: Payer: Self-pay | Admitting: Family Medicine

## 2018-05-24 VITALS — BP 140/66 | HR 95 | Temp 98.0°F | Ht 69.0 in

## 2018-05-24 DIAGNOSIS — N390 Urinary tract infection, site not specified: Secondary | ICD-10-CM

## 2018-05-24 DIAGNOSIS — Z794 Long term (current) use of insulin: Secondary | ICD-10-CM

## 2018-05-24 DIAGNOSIS — E1165 Type 2 diabetes mellitus with hyperglycemia: Secondary | ICD-10-CM | POA: Diagnosis not present

## 2018-05-24 LAB — BASIC METABOLIC PANEL
BUN: 23 mg/dL (ref 6–23)
CHLORIDE: 104 meq/L (ref 96–112)
CO2: 32 meq/L (ref 19–32)
CREATININE: 0.69 mg/dL (ref 0.40–1.20)
Calcium: 9.5 mg/dL (ref 8.4–10.5)
GFR: 86.4 mL/min (ref >60.00)
GLUCOSE: 159 mg/dL — AB (ref 70–99)
Potassium: 4.4 mEq/L (ref 3.5–5.1)
Sodium: 141 mEq/L (ref 135–145)

## 2018-05-24 LAB — HEMOGLOBIN A1C: Hgb A1c MFr Bld: 10.3 % — ABNORMAL HIGH (ref 4.6–6.5)

## 2018-05-24 MED ORDER — CEPHALEXIN 500 MG PO CAPS: 500.0000 mg | ORAL_CAPSULE | Freq: Three times a day (TID) | ORAL | 0 refills | Status: DC

## 2018-05-24 NOTE — Progress Notes (Signed)
   Subjective:    Patient ID: Melanie Mcdonald, female    DOB: Feb 22, 1933, 84 y.o.   MRN: [id number]  HPI Here for 4 days of urinary urgency and burning. Using AZO. She has take 2 capsules of Keflex but none today. No fever. Some nausea without vomiting. Drinking water.    Review of Systems  Constitutional: Negative.   Respiratory: Negative.   Cardiovascular: Negative.   Gastrointestinal: Negative.   Genitourinary: Positive for dysuria and urgency. Negative for flank pain and hematuria.       Objective:   Physical Exam  Constitutional: She appears well-developed and well-nourished.  Cardiovascular: Normal rate, regular rhythm, normal heart sounds and intact distal pulses.  Pulmonary/Chest: Effort normal and breath sounds normal.  Abdominal: Soft. Bowel sounds are normal. She exhibits no distension and no mass. There is no tenderness. There is no rebound and no guarding. No hernia.          Assessment & Plan:  UTI, given enough Keflex to last 10 days. Recheck prn.  Alysia Penna, MD

## 2018-05-27 ENCOUNTER — Telehealth: Payer: Self-pay | Admitting: Family Medicine

## 2018-05-27 NOTE — Telephone Encounter (Signed)
Copied from [place] [id number]. Topic: General - Other >> May 27, 2018  1:47 PM Yvette Rack wrote: Reason for CRM: Lattie Haw with [facility] states pt is having some swelling in her legs especially in the right leg. Lattie Haw said pt advised her that the right leg is hard, hot, red and painful. Per Lattie Haw pt will be having a home health nurse come out and she advised pt to call the office while nurse is at the home. Cb# [phone] Ext [phone]

## 2018-05-27 NOTE — Telephone Encounter (Signed)
Caryl Pina from [facility] is w/ the pt now and she has observed the pt and the the pt's right leg is bigger than the left; the redness is the same but the right leg is warmer and pt is retaining fluid; contact to advise while nurse is there; nurse will be there roughly for 20 mins more; Contact: [phone]

## 2018-05-27 NOTE — Telephone Encounter (Signed)
Called and spoke with pt. Pt stated that she forgot to mention the issues with her leg at her office last week.   Pt stated that she has been dealing with this issues for a while now.   Right leg is very swollen, red, and hard to the touch, skin feels like it is sweeping fluid. Will feel a burning sensation at times.   Left leg looks much better than the right leg only dealing with some redness discoloration from her ankle down.   Sent to PCP

## 2018-05-27 NOTE — Telephone Encounter (Signed)
Sent to PCP to advise 

## 2018-05-28 MED ORDER — CEPHALEXIN 500 MG PO CAPS: 500.0000 mg | ORAL_CAPSULE | Freq: Four times a day (QID) | ORAL | 0 refills | Status: DC

## 2018-05-28 NOTE — Telephone Encounter (Signed)
RN Ashlee w/[facility] - [phone]    Called in to follow up, advised per providers message. Ashlee states that pt has not being taking her lancets like she is suppose to so she feels that some of the swelling could be caused by that. ALSO, is requesting extended visits due to starting a new medication (Keflex) to make sure that pt is okay on medication.   Frequency: 2 x week 1 and 1 week 3   She is also requesting a refill on medication Phenazopyridine 200MG  Directions read: 1 tab 3 times a day as needed. The Rx has expired.    Pharmacy: [facility] [place], [place]

## 2018-05-28 NOTE — Telephone Encounter (Signed)
Keflex 500 mg QID for 10 days.

## 2018-05-28 NOTE — Telephone Encounter (Signed)
Called and spoke with pt. Pt advised and voiced understanding. Rx has been called into pt's pharmacy.

## 2018-05-28 NOTE — Telephone Encounter (Signed)
It sounds like she may have some cellulitis in the leg. Call in Keflex 500 mg QID for 10 days. She should see [place] in about a week. If she gets a fever, she should go to the ER

## 2018-05-31 ENCOUNTER — Telehealth: Payer: Self-pay | Admitting: Family Medicine

## 2018-05-31 NOTE — Telephone Encounter (Signed)
Number listed as a call back for [place] with [facility] has been disconnected or no longer in service

## 2018-05-31 NOTE — Telephone Encounter (Signed)
Copied from [place] [phone] [id number]. Topic: Quick Communication - See Telephone Encounter >> May 31, 2018  3:46 PM Bea Graff, NT wrote: CRM for notification. See Telephone encounter for: 05/31/18. Temaka with [employer] calling and states pt is out of her insuling needles and would like a nurse to call the pt and clarify that the pt is still to use this type of insulin. [employer] Cb#: [phone]

## 2018-05-31 NOTE — Telephone Encounter (Signed)
Currently should be be taking the following   Humalog and T[place]?   Sent to PCP to advise

## 2018-06-05 NOTE — Telephone Encounter (Signed)
Number listed has been disconnected and no longer in service

## 2018-06-05 NOTE — Telephone Encounter (Signed)
Yes she takes Humalog and [place]

## 2018-06-06 ENCOUNTER — Telehealth: Payer: Self-pay | Admitting: Cardiology

## 2018-06-06 ENCOUNTER — Telehealth: Payer: Self-pay | Admitting: Family Medicine

## 2018-06-06 NOTE — Telephone Encounter (Signed)
Ok to give verbal orders?

## 2018-06-06 NOTE — Telephone Encounter (Signed)
Copied from [place] [id number]. Topic: Quick Communication - See Telephone Encounter >> Jun 06, 2018 10:25 AM Ahmed Prima L wrote: CRM for notification. See Telephone encounter for: 06/06/18.  Laurey Arrow, physical therapist with [facility] called and wanted Dr Sarajane Jews to know she is still having some swelling and pain in her legs and her back. He would like to extend his time to see her.  One time a week for two more weeks. Call back @ [phone]

## 2018-06-06 NOTE — Telephone Encounter (Signed)
Spoke w/ pt and requested that she send a manual transmission b/c her home monitor has not updated in at least 14 days.   

## 2018-06-07 ENCOUNTER — Ambulatory Visit: Payer: Medicare Other | Admitting: Family Medicine

## 2018-06-07 ENCOUNTER — Ambulatory Visit (INDEPENDENT_AMBULATORY_CARE_PROVIDER_SITE_OTHER): Payer: Medicare Other | Admitting: *Deleted

## 2018-06-07 ENCOUNTER — Encounter: Payer: Self-pay | Admitting: Family Medicine

## 2018-06-07 VITALS — BP 130/68 | HR 83 | Temp 97.8°F | Ht 69.0 in | Wt 222.6 lb

## 2018-06-07 DIAGNOSIS — I10 Essential (primary) hypertension: Secondary | ICD-10-CM

## 2018-06-07 DIAGNOSIS — M109 Gout, unspecified: Secondary | ICD-10-CM

## 2018-06-07 DIAGNOSIS — Z794 Long term (current) use of insulin: Secondary | ICD-10-CM

## 2018-06-07 DIAGNOSIS — L03115 Cellulitis of right lower limb: Secondary | ICD-10-CM | POA: Diagnosis not present

## 2018-06-07 DIAGNOSIS — E1165 Type 2 diabetes mellitus with hyperglycemia: Secondary | ICD-10-CM | POA: Diagnosis not present

## 2018-06-07 DIAGNOSIS — I6340 Cerebral infarction due to embolism of unspecified cerebral artery: Secondary | ICD-10-CM | POA: Diagnosis not present

## 2018-06-07 LAB — URIC ACID: Uric Acid, Serum: 3.9 mg/dL (ref 2.4–7.0)

## 2018-06-07 MED ORDER — CEPHALEXIN 500 MG PO CAPS: 500.0000 mg | ORAL_CAPSULE | Freq: Four times a day (QID) | ORAL | 0 refills | Status: DC

## 2018-06-07 MED ORDER — TERBINAFINE HCL 250 MG PO TABS: 250.0000 mg | ORAL_TABLET | Freq: Every day | ORAL | 1 refills | Status: DC

## 2018-06-07 MED ORDER — INSULIN DEGLUDEC 100 UNIT/ML ~~LOC~~ SOPN: 80.0000 [IU] | PEN_INJECTOR | Freq: Every morning | SUBCUTANEOUS | 3 refills | Status: DC

## 2018-06-07 MED ORDER — HUMALOG KWIKPEN 100 UNIT/ML ~~LOC~~ SOPN: 20.0000 [IU] | PEN_INJECTOR | Freq: Three times a day (TID) | SUBCUTANEOUS | 3 refills | Status: DC

## 2018-06-07 NOTE — Progress Notes (Signed)
   Subjective:    Patient ID: Melanie Mcdonald, female    DOB: 03-18-1933, 84 y.o.   MRN: [id number]  HPI Here to recheck cellulitis in the lower legs. The redness has improved but they are still swollen and somewhat painful. No fever. She also notes that her sugars seem to drop in the afternoons. We had adjusted her insulin last visit.    Review of Systems  Constitutional: Negative.   Respiratory: Negative.   Cardiovascular: Positive for leg swelling. Negative for chest pain and palpitations.  Skin: Positive for color change.  Neurological: Negative.        Objective:   Physical Exam  Constitutional: She is oriented to person, place, and time. She appears well-developed and well-nourished.  Cardiovascular: Normal rate, regular rhythm, normal heart sounds and intact distal pulses.  Pulmonary/Chest: Effort normal and breath sounds normal.  Musculoskeletal:  Both lower legs have 3+ edema   Neurological: She is alert and oriented to person, place, and time.  Skin:  Right lower leg is pink and a bit warm           Assessment & Plan:  Cellulitis, treat with another 10 days of Keflex. For the diabetes we will decrease the Humalog to 20 units tid with meals.  Alysia Penna, MD

## 2018-06-08 NOTE — Progress Notes (Signed)
[facility] Summary Report / Loop Recorder 

## 2018-06-10 ENCOUNTER — Telehealth: Payer: Self-pay | Admitting: *Deleted

## 2018-06-10 NOTE — Telephone Encounter (Signed)
Called Amanda with [facility]. Left a VM with pt's name and DOB advising them no dose increase but pt is to take 40 MG of lasix twice daily. Left our call back number if needed.

## 2018-06-10 NOTE — Telephone Encounter (Signed)
Please okay these orders  ?

## 2018-06-10 NOTE — Telephone Encounter (Signed)
Verbal orders have been given  

## 2018-06-10 NOTE — Telephone Encounter (Signed)
No dose increase pt is to take lasix 40 MB twice daily

## 2018-06-10 NOTE — Telephone Encounter (Signed)
Copied from [place] [id number]. Topic: General - Other >> Jun 10, 2018  9:00 AM Judyann Munson wrote: Reason for CRM:  Amanda-[facility]  patient has gained weight  from  06-04-18 216 lb and her weight now 06-10-18 is 221 lb. Estill Bamberg is wanting to know from her last office visit has her  furosemide (LASIX) 40 MG tablet been increased?  She has a pain level on 8 today (06-10-18).   Best contact [place].

## 2018-06-11 NOTE — Telephone Encounter (Signed)
Called and left a VM to call back.

## 2018-06-17 ENCOUNTER — Encounter (HOSPITAL_COMMUNITY): Payer: Self-pay

## 2018-06-17 ENCOUNTER — Inpatient Hospital Stay (HOSPITAL_COMMUNITY)
Admission: EM | Admit: 2018-06-17 | Discharge: 2018-06-19 | DRG: 291 | Disposition: A | Discharge status: Home Health | Payer: Medicare Other | Attending: Family Medicine | Admitting: Family Medicine

## 2018-06-17 ENCOUNTER — Other Ambulatory Visit: Payer: Self-pay

## 2018-06-17 ENCOUNTER — Emergency Department (HOSPITAL_COMMUNITY): Payer: Medicare Other

## 2018-06-17 DIAGNOSIS — M7989 Other specified soft tissue disorders: Secondary | ICD-10-CM

## 2018-06-17 DIAGNOSIS — K5730 Diverticulosis of large intestine without perforation or abscess without bleeding: Secondary | ICD-10-CM | POA: Diagnosis present

## 2018-06-17 DIAGNOSIS — M79609 Pain in unspecified limb: Secondary | ICD-10-CM | POA: Diagnosis not present

## 2018-06-17 DIAGNOSIS — G4733 Obstructive sleep apnea (adult) (pediatric): Secondary | ICD-10-CM | POA: Diagnosis present

## 2018-06-17 DIAGNOSIS — E1151 Type 2 diabetes mellitus with diabetic peripheral angiopathy without gangrene: Secondary | ICD-10-CM | POA: Diagnosis present

## 2018-06-17 DIAGNOSIS — J810 Acute pulmonary edema: Secondary | ICD-10-CM

## 2018-06-17 DIAGNOSIS — M5136 Other intervertebral disc degeneration, lumbar region: Secondary | ICD-10-CM | POA: Diagnosis present

## 2018-06-17 DIAGNOSIS — E11649 Type 2 diabetes mellitus with hypoglycemia without coma: Secondary | ICD-10-CM | POA: Diagnosis not present

## 2018-06-17 DIAGNOSIS — J9600 Acute respiratory failure, unspecified whether with hypoxia or hypercapnia: Secondary | ICD-10-CM

## 2018-06-17 DIAGNOSIS — M1990 Unspecified osteoarthritis, unspecified site: Secondary | ICD-10-CM | POA: Diagnosis present

## 2018-06-17 DIAGNOSIS — J9601 Acute respiratory failure with hypoxia: Secondary | ICD-10-CM | POA: Diagnosis present

## 2018-06-17 DIAGNOSIS — Z794 Long term (current) use of insulin: Secondary | ICD-10-CM | POA: Diagnosis not present

## 2018-06-17 DIAGNOSIS — E785 Hyperlipidemia, unspecified: Secondary | ICD-10-CM | POA: Diagnosis present

## 2018-06-17 DIAGNOSIS — I110 Hypertensive heart disease with heart failure: Secondary | ICD-10-CM | POA: Diagnosis present

## 2018-06-17 DIAGNOSIS — Z7902 Long term (current) use of antithrombotics/antiplatelets: Secondary | ICD-10-CM

## 2018-06-17 DIAGNOSIS — L03115 Cellulitis of right lower limb: Secondary | ICD-10-CM | POA: Diagnosis present

## 2018-06-17 DIAGNOSIS — M545 Low back pain: Secondary | ICD-10-CM | POA: Diagnosis present

## 2018-06-17 DIAGNOSIS — Z8673 Personal history of transient ischemic attack (TIA), and cerebral infarction without residual deficits: Secondary | ICD-10-CM

## 2018-06-17 DIAGNOSIS — Z90710 Acquired absence of both cervix and uterus: Secondary | ICD-10-CM

## 2018-06-17 DIAGNOSIS — Z882 Allergy status to sulfonamides status: Secondary | ICD-10-CM

## 2018-06-17 DIAGNOSIS — Z888 Allergy status to other drugs, medicaments and biological substances status: Secondary | ICD-10-CM

## 2018-06-17 DIAGNOSIS — I340 Nonrheumatic mitral (valve) insufficiency: Secondary | ICD-10-CM | POA: Diagnosis not present

## 2018-06-17 DIAGNOSIS — L03116 Cellulitis of left lower limb: Secondary | ICD-10-CM | POA: Diagnosis present

## 2018-06-17 DIAGNOSIS — I5030 Unspecified diastolic (congestive) heart failure: Secondary | ICD-10-CM | POA: Diagnosis not present

## 2018-06-17 DIAGNOSIS — K589 Irritable bowel syndrome without diarrhea: Secondary | ICD-10-CM | POA: Diagnosis present

## 2018-06-17 DIAGNOSIS — Z79899 Other long term (current) drug therapy: Secondary | ICD-10-CM

## 2018-06-17 DIAGNOSIS — I2510 Atherosclerotic heart disease of native coronary artery without angina pectoris: Secondary | ICD-10-CM | POA: Diagnosis present

## 2018-06-17 DIAGNOSIS — G8929 Other chronic pain: Secondary | ICD-10-CM | POA: Diagnosis present

## 2018-06-17 DIAGNOSIS — Z9849 Cataract extraction status, unspecified eye: Secondary | ICD-10-CM

## 2018-06-17 DIAGNOSIS — I5033 Acute on chronic diastolic (congestive) heart failure: Secondary | ICD-10-CM | POA: Diagnosis present

## 2018-06-17 DIAGNOSIS — R32 Unspecified urinary incontinence: Secondary | ICD-10-CM | POA: Diagnosis present

## 2018-06-17 DIAGNOSIS — I509 Heart failure, unspecified: Secondary | ICD-10-CM

## 2018-06-17 DIAGNOSIS — J449 Chronic obstructive pulmonary disease, unspecified: Secondary | ICD-10-CM | POA: Diagnosis present

## 2018-06-17 DIAGNOSIS — Z881 Allergy status to other antibiotic agents status: Secondary | ICD-10-CM

## 2018-06-17 DIAGNOSIS — Z880 Allergy status to penicillin: Secondary | ICD-10-CM

## 2018-06-17 DIAGNOSIS — Z825 Family history of asthma and other chronic lower respiratory diseases: Secondary | ICD-10-CM

## 2018-06-17 HISTORY — DX: Acute respiratory failure, unspecified whether with hypoxia or hypercapnia: J96.00

## 2018-06-17 LAB — CBC
HCT: 38.8 % (ref 36.0–46.0)
HEMOGLOBIN: 12.4 g/dL (ref 12.0–15.0)
MCH: 29.7 pg (ref 26.0–34.0)
MCHC: 32 g/dL (ref 30.0–36.0)
MCV: 93 fL (ref 78.0–100.0)
Platelets: 170 10*3/uL (ref 150–400)
RBC: 4.17 MIL/uL (ref 3.87–5.11)
RDW: 14.3 % (ref 11.5–15.5)
WBC: 4.7 10*3/uL (ref 4.0–10.5)

## 2018-06-17 LAB — BLOOD GAS, ARTERIAL
ACID-BASE EXCESS: 2.1 mmol/L — AB (ref 0.0–2.0)
Bicarbonate: 26.7 mmol/L (ref 20.0–28.0)
Delivery systems: POSITIVE
Drawn by: 270211
EXPIRATORY PAP: 6
FIO2: 0.4
Inspiratory PAP: 12
O2 Saturation: 97.3 %
PCO2 ART: 44 mmHg (ref 32.0–48.0)
PH ART: 7.401 (ref 7.350–7.450)
Patient temperature: 37
pO2, Arterial: 99.5 mmHg (ref 83.0–108.0)

## 2018-06-17 LAB — CBG MONITORING, ED: Glucose-Capillary: 163 mg/dL — ABNORMAL HIGH (ref 70–99)

## 2018-06-17 LAB — COMPREHENSIVE METABOLIC PANEL
ALT: 18 U/L (ref 0–44)
ANION GAP: 7 (ref 5–15)
AST: 24 U/L (ref 15–41)
Albumin: 3.5 g/dL (ref 3.5–5.0)
Alkaline Phosphatase: 73 U/L (ref 38–126)
BUN: 17 mg/dL (ref 8–23)
CO2: 30 mmol/L (ref 22–32)
Calcium: 8.6 mg/dL — ABNORMAL LOW (ref 8.9–10.3)
Chloride: 105 mmol/L (ref 98–111)
Creatinine, Ser: 0.78 mg/dL (ref 0.44–1.00)
GFR calc non Af Amer: >60 mL/min (ref >60)
Glucose, Bld: 168 mg/dL — ABNORMAL HIGH (ref 70–99)
POTASSIUM: 4.1 mmol/L (ref 3.5–5.1)
Sodium: 142 mmol/L (ref 135–145)
TOTAL PROTEIN: 6.8 g/dL (ref 6.5–8.1)
Total Bilirubin: 0.3 mg/dL (ref 0.3–1.2)

## 2018-06-17 LAB — BRAIN NATRIURETIC PEPTIDE: B Natriuretic Peptide: 321.7 pg/mL — ABNORMAL HIGH (ref 0.0–100.0)

## 2018-06-17 LAB — MRSA PCR SCREENING: MRSA by PCR: POSITIVE — AB

## 2018-06-17 LAB — TROPONIN I: TROPONIN I: 0.03 ng/mL — AB (ref <0.03)

## 2018-06-17 MED ORDER — ONDANSETRON HCL 4 MG PO TABS: 8.0000 mg | ORAL_TABLET | Freq: Three times a day (TID) | ORAL | Status: DC | PRN

## 2018-06-17 MED ORDER — FUROSEMIDE 10 MG/ML IJ SOLN
40.0000 mg | Freq: Once | INTRAMUSCULAR | Status: AC
  Administered 2018-06-17: 40 mg via INTRAVENOUS
  Filled 2018-06-17: qty 4

## 2018-06-17 MED ORDER — SODIUM CHLORIDE 0.9% FLUSH
3.0000 mL | Freq: Two times a day (BID) | INTRAVENOUS | Status: DC
  Administered 2018-06-17 – 2018-06-19 (×4): 3 mL via INTRAVENOUS

## 2018-06-17 MED ORDER — TERBINAFINE HCL 250 MG PO TABS
250.0000 mg | ORAL_TABLET | Freq: Every day | ORAL | Status: DC
  Administered 2018-06-17 – 2018-06-19 (×3): 250 mg via ORAL
  Filled 2018-06-17 (×3): qty 1

## 2018-06-17 MED ORDER — NITROGLYCERIN 2 % TD OINT
1.0000 [in_us] | TOPICAL_OINTMENT | Freq: Once | TRANSDERMAL | Status: AC
  Administered 2018-06-17: 1 [in_us] via TOPICAL
  Filled 2018-06-17: qty 1

## 2018-06-17 MED ORDER — ZOLPIDEM TARTRATE 5 MG PO TABS
5.0000 mg | ORAL_TABLET | Freq: Every evening | ORAL | Status: DC | PRN
  Administered 2018-06-17 – 2018-06-18 (×2): 5 mg via ORAL
  Filled 2018-06-17 (×2): qty 1

## 2018-06-17 MED ORDER — SPIRONOLACTONE 25 MG PO TABS
25.0000 mg | ORAL_TABLET | Freq: Every day | ORAL | Status: DC
  Administered 2018-06-18: 25 mg via ORAL
  Filled 2018-06-17 (×2): qty 1

## 2018-06-17 MED ORDER — IPRATROPIUM-ALBUTEROL 0.5-2.5 (3) MG/3ML IN SOLN
3.0000 mL | Freq: Once | RESPIRATORY_TRACT | Status: DC
  Filled 2018-06-17: qty 3

## 2018-06-17 MED ORDER — FUROSEMIDE 10 MG/ML IJ SOLN
60.0000 mg | Freq: Two times a day (BID) | INTRAMUSCULAR | Status: DC
  Administered 2018-06-18 – 2018-06-19 (×2): 60 mg via INTRAVENOUS
  Filled 2018-06-17 (×3): qty 6

## 2018-06-17 MED ORDER — PRAMIPEXOLE DIHYDROCHLORIDE 1 MG PO TABS
4.0000 mg | ORAL_TABLET | Freq: Every day | ORAL | Status: DC
  Administered 2018-06-17 – 2018-06-18 (×2): 4 mg via ORAL
  Filled 2018-06-17 (×3): qty 4

## 2018-06-17 MED ORDER — INSULIN GLARGINE 100 UNIT/ML ~~LOC~~ SOLN
80.0000 [IU] | Freq: Every morning | SUBCUTANEOUS | Status: DC
  Administered 2018-06-18 – 2018-06-19 (×2): 80 [IU] via SUBCUTANEOUS
  Filled 2018-06-17 (×2): qty 0.8

## 2018-06-17 MED ORDER — INSULIN ASPART 100 UNIT/ML ~~LOC~~ SOLN
20.0000 [IU] | Freq: Three times a day (TID) | SUBCUTANEOUS | Status: DC
  Administered 2018-06-18 – 2018-06-19 (×2): 20 [IU] via SUBCUTANEOUS

## 2018-06-17 MED ORDER — OXYCODONE-ACETAMINOPHEN 5-325 MG PO TABS
1.0000 | ORAL_TABLET | Freq: Once | ORAL | Status: AC
  Administered 2018-06-17: 1 via ORAL
  Filled 2018-06-17: qty 1

## 2018-06-17 MED ORDER — CLOPIDOGREL BISULFATE 75 MG PO TABS
75.0000 mg | ORAL_TABLET | Freq: Every day | ORAL | Status: DC
  Administered 2018-06-17 – 2018-06-19 (×3): 75 mg via ORAL
  Filled 2018-06-17 (×3): qty 1

## 2018-06-17 MED ORDER — CEPHALEXIN 500 MG PO CAPS
500.0000 mg | ORAL_CAPSULE | Freq: Four times a day (QID) | ORAL | Status: DC
  Administered 2018-06-17 – 2018-06-19 (×6): 500 mg via ORAL
  Filled 2018-06-17 (×7): qty 1

## 2018-06-17 MED ORDER — CYCLOBENZAPRINE HCL 10 MG PO TABS
10.0000 mg | ORAL_TABLET | Freq: Three times a day (TID) | ORAL | Status: DC | PRN
  Administered 2018-06-18 – 2018-06-19 (×3): 10 mg via ORAL
  Filled 2018-06-17 (×3): qty 1

## 2018-06-17 MED ORDER — MORPHINE SULFATE (PF) 2 MG/ML IV SOLN
0.5000 mg | Freq: Once | INTRAVENOUS | Status: AC
  Administered 2018-06-17: 0.5 mg via INTRAVENOUS
  Filled 2018-06-17: qty 1

## 2018-06-17 MED ORDER — ENOXAPARIN SODIUM 40 MG/0.4ML ~~LOC~~ SOLN
40.0000 mg | SUBCUTANEOUS | Status: DC
  Administered 2018-06-17 – 2018-06-18 (×2): 40 mg via SUBCUTANEOUS
  Filled 2018-06-17 (×2): qty 0.4

## 2018-06-17 NOTE — Progress Notes (Signed)
Preliminary notes--Bilateral lower extremities venous duplex exam completed. Negative for DVT where exam can be approachable. Poor visualization at bilateral calf region, limited compression due to patient has very minimum tolerance to compression, exam cannot completely exclude calf veins thrombosis' possibility.  A 3.27x4.19x1.22cm lymph node seen at groin area.  Melanie Mcdonald (RDMS RVT) 06/17/18 1:00 PM

## 2018-06-17 NOTE — ED Notes (Signed)
Patient on Kelfex for cellulitis on legs. Patient states legs have been more swollen since. Slight redness noted to bilateral legs.

## 2018-06-17 NOTE — ED Notes (Signed)
Patient with new onset of SOB, crackles in lower lungs. Dr. Rex Kras at bedside. Patient oxygenation saturation 79% on 5L, placed on NRB. Respiratory contacted for bipap.

## 2018-06-17 NOTE — ED Notes (Signed)
Patient placed on 2L [place].

## 2018-06-17 NOTE — H&P (Signed)
HPI  Melanie Mcdonald:[id number] DOB: Oct 17, 1932 DOA: 06/17/2018  PCP: Laurey Morale, MD   Chief Complaint: Back pain--- ED developed flash pulmonary edema  HPI:  84 year old female Hypertension Diastolic heart failure NYHA class III TIA October 2018 with implantation of loop recorder-cryptogenic stroke Hyperlipidemia Also had?  Intermittent claudication in the past  Vacuuming yesterday at home felt a sharp pain in her lower back-she was able to however walk with the same and move around with the pain-radiating sensation down both legs to the back of the knee-able to control below bladder-has had multiple back procedures in the past she says Also has had knee shots and shoulder shots  Came to the emergency room for work-up of back pain  Had not taken her medications this morning including insulin and diuretics-suddenly became short of breath had crackles and rales developed acute pulmonary edema-given diuretics placed on BiPAP 6/10  40% FiO2 I saw her and got a gas after she had been on the machine for over 2 hours-she looked much better and did not have any oxygen requirement   ED Course: Patient placed on BiPAP given Lasix 40 IV x2 doses at my request second dose for her back pain I gave her morphine 0.5 She was given in the emergency room oxycodone nitroglycerin  Review of Systems:  No fever no chills no other issue Negative for fever, visual changes, sore throat, rash, new muscle aches, chest pain, SOB, dysuria, bleeding, n/v/abdominal pain.  Past Medical History:  Diagnosis Date  . ANEMIA 01/30/2008  . ARTHRITIS 12/11/2007  . BACK PAIN, CHRONIC 06/23/2008  . BREAST CYST, RIGHT 12/17/2007  . Cancer ([place])   . CHF (congestive heart failure) ([place])   . Colon polyps    FRAGMENTS OF HYPERPLASTIC POLYP  . CONTACT DERMATITIS 03/10/2009  . COPD (chronic obstructive pulmonary disease) ([place])   . CORONARY ARTERY DISEASE 03/01/2007   had a normal Myoview stress test 07-13-11  .  CYSTOCELE WITHOUT MENTION UTERINE PROLAPSE LAT 09/12/2010  . DEGENERATIVE JOINT DISEASE 08/07/2007  . DEPRESSION 03/01/2007  . DIABETES MELLITUS, TYPE II 08/26/2008  . Diverticulosis of colon (without mention of hemorrhage)   . Esophageal reflux 11/06/2007  . HYPERKERATOSIS 06/02/2009  . HYPERLIPIDEMIA 08/26/2008  . HYPERTENSION 03/01/2007  . Irritable bowel syndrome 01/26/2009  . LABYRINTHITIS 11/03/2009  . 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 NEPHROLITHIASIS 04/29/2009  . OBESITY 03/01/2007  . PERIPHERAL VASCULAR DISEASE 01/26/2009  . Stroke ([place])   . SYNCOPE 08/26/2008   had brain MRI on 06-28-12 showing only chronic microvascular ischemia and atrophy   . TRANSIENT ISCHEMIC ATTACK 10/20/2009   had normal brain MRA with patent vertebrals and carotids 06-28-12    Past Surgical History:  Procedure Laterality Date  . ABDOMINAL HYSTERECTOMY    . BACK SURGERY     x2  . CARDIAC CATHETERIZATION  07/2009  . CATARACT EXTRACTION    . COLONOSCOPY  11-13-07   per Dr. Deatra Ina, benign polyps, repeat in 5 yrs  . ESOPHAGOGASTRODUODENOSCOPY  11-13-07   per Dr. Deatra Ina, normal   . INTRAOCULAR LENS INSERTION    . LOOP RECORDER INSERTION N/A 07/23/2017   Procedure: LOOP RECORDER INSERTION;  Surgeon: Evans Lance, MD;  Location: [place] [facility];  Service: Cardiovascular;  Laterality: N/A;  . SPINE SURGERY     x 3  . TONSILECTOMY, ADENOIDECTOMY, BILATERAL MYRINGOTOMY AND TUBES       reports that she has never smoked. She has never used smokeless tobacco. She  reports that she does not drink alcohol or use drugs. Mobility: At baseline moves around with help of a walker She has care coming out to her house to help manage some of her symptoms-the therapist that saw her this morning center and because she did not look good   Allergies  Allergen Reactions  . Lyrica [Pregabalin] Other (See Comments)    Suicidal   . Aspirin Other (See Comments)    Abdominal pain  . Ciprofloxacin Nausea And  Vomiting    Made pt very sick on stomach  . Clarithromycin Other (See Comments)    Made stomach "burn"  . Doxycycline Itching  . Erythromycin Nausea And Vomiting  . Lisinopril Cough  . Macrobid [Nitrofurantoin Monohyd Macro] Nausea And Vomiting  . Metolazone Other (See Comments)    Pt stated this made her B/P drop  . Other Nausea And Vomiting    Pt cannot have any of the -mycin(s)  . Penicillins Swelling    From childhood: swelling @ injection site,childhood allergy Has patient had a PCN reaction causing immediate rash, facial/tongue/throat swelling, SOB or lightheadedness with hypotension: Yes Has patient had a PCN reaction causing severe rash involving mucus membranes or skin necrosis: No Has patient had a PCN reaction that required hospitalization: No Has patient had a PCN reaction occurring within the last 10 years: No If all of the above answers are "NO", then may proceed with Cephalosporin use.  . Sulfonamide Derivatives Nausea And Vomiting  . Valtrex [Valacyclovir Hcl] Nausea Only    Family History  Problem Relation Age of Onset  . Lupus Mother   . COPD Father      Prior to Admission medications   Medication Sig Start Date End Date Taking? Authorizing Provider  Alpha-D-Galactosidase (BEANO PO) Take by mouth daily as needed.   Yes [provider]  cephALEXin (KEFLEX) 500 MG capsule Take 1 capsule (500 mg total) by mouth 4 (four) times daily. 06/07/18  Yes Laurey Morale, MD  clopidogrel (PLAVIX) 75 MG tablet Take 1 tablet (75 mg total) by mouth daily. 05/06/18  Yes Cameron Sprang, MD  cyclobenzaprine (FLEXERIL) 10 MG tablet Take 10 mg by mouth as needed for muscle spasms.   Yes [provider]  furosemide (LASIX) 40 MG tablet Take 1 tablet (40 mg total) by mouth 2 (two) times daily with breakfast and lunch. 02/01/18  Yes Laurey Morale, MD  HUMALOG KWIKPEN 100 UNIT/ML KiwkPen Inject 0.2 mLs (20 Units total) into the skin 3 (three) times daily before meals.  06/07/18  Yes Laurey Morale, MD  insulin degludec (TRESIBA FLEXTOUCH) 100 UNIT/ML SOPN FlexTouch Pen Inject 0.8 mLs (80 Units total) into the skin every morning. Patient taking differently: Inject 100 Units into the skin every morning.  06/07/18  Yes Laurey Morale, MD  meclizine (ANTIVERT) 12.5 MG tablet Take 1 tablet (12.5 mg total) by mouth 3 (three) times daily as needed for dizziness. Patient taking differently: Take 25 mg by mouth 3 (three) times daily as needed for dizziness.  05/15/18  Yes Laurey Morale, MD  ondansetron (ZOFRAN) 8 MG tablet Take 1 tablet (8 mg total) by mouth every 8 (eight) hours as needed for nausea or vomiting. 12/27/17  Yes Laurey Morale, MD  pramipexole (MIRAPEX) 1 MG tablet TAKE 6 TABLETS BY MOUTH DAILY Patient taking differently: Take 4 mg by mouth daily.  05/06/18  Yes Laurey Morale, MD  terbinafine (LAMISIL) 250 MG tablet Take 1 tablet (250 mg total)  by mouth daily. 06/07/18  Yes Laurey Morale, MD  zolpidem (AMBIEN) 10 MG tablet Take 1 tablet (10 mg total) by mouth at bedtime as needed for sleep. 05/03/18 06/17/18 Yes Laurey Morale, MD  glucose blood (ONE TOUCH ULTRA TEST) test strip 1 each by Other route as needed for other. Use as instructed    [provider]  Insulin Pen Needle (NOVOFINE) 32G X 6 MM MISC by Does not apply route 3 (three) times daily.    [provider]  spironolactone (ALDACTONE) 25 MG tablet TAKE 1 TABLET BY MOUTH EVERY DAY Patient not taking: Reported on 06/17/2018 05/21/18   Dorothy Spark, MD    Physical Exam:  Vitals:   06/17/18 1530 06/17/18 1545  BP: (!) 141/62 117/85  Pulse: 73 72  Resp: 18 19  Temp:    SpO2: 98% 98%   Examined the patient before and after BiPAP  Obese thick neck EOMI NCAT  S1-S2 no murmur rub or gallop  Slight rales or rhonchi now with improvement of the same work of breathing much improved  Abdomen obese nontender no rebound  Grade 2-3 lower extremity edema  Neurologically intact moves  all 4 limbs equally smile symmetric external ocular movements are intact   I have personally reviewed following labs and imaging studies  Labs:   BUN/creatinine 0.7 baseline is 0.8  ABG 7.40 4499% on BiPAP FiO2 0.4   12/6  Imaging studies:   Chest x-ray showed central venous congestion without edema  Medical tests:   EKG independently reviewed: Sinus rhythm PR interval 0.12 QRS axis is 45 degrees some ST-T wave elevations but there are not new compared to EKG 10/18/2017 QTC 477  Test discussed with performing physician:  y   Decision to obtain old records:   y   Review and summation of old records:   y   Active Problems:   * No active hospital problems. *   Assessment/Plan Acute flash pulmonary edema Back pain unclear etiology Chronic diastolic heart failure likely Prior strokes cryptogenic cause with loop recorder Lipidemia Prior intermittent claudication  Flash pulmonary edema-probably secondary to not being able to get her Lasix and her Aldactone-her weight is actually down from 106 kg March 2019 to about 98 kg Strict I/oh, diuresis in ED 2 times with a total of 80 mg of Lasix We will continue diuresis with IV Lasix 60 twice daily, resume Aldactone and keep on stepdown  [facility] habitus-probably has sleep apnea-would trial auto titration on CPAP at night tonight in stepdown unit  Back pain-we will get plain film x-rays tomorrow morning when she is stabilized given radicular component-we will try Flexeril and K pad to the area in addition to very low doses of IV morphine she may need Percocet with a higher dose on going home-if it is not resolved we will get therapy to see her and she may need a CT scan if x-rays do not show anything  Prior intermittent claudication see above discussion-might have claudication pain?  Hyperlipidemia outpatient lipid panel needed  Prior stroke continue Plavix    Severity of Illness: The appropriate patient status for this  patient is INPATIENT. Inpatient status is judged to be reasonable and necessary in order to provide the required intensity of service to ensure the patient's safety. The patient's presenting symptoms, physical exam findings, and initial radiographic and laboratory data in the context of their chronic comorbidities is felt to place them at high risk for further clinical deterioration. Furthermore,  it is not anticipated that the patient will be medically stable for discharge from the hospital within 2 midnights of admission. The following factors support the patient status of inpatient.   " The patient's presenting symptoms include acute decompensation and shortness of breath. " The worrisome physical exam findings include x-ray showing edema. " The initial radiographic and laboratory data are worrisome because of edema on x-ray and. " The chronic co-morbidities include obesity.   * I certify that at the point of admission it is my clinical judgment that the patient will require inpatient hospital care spanning beyond 2 midnights from the point of admission due to high intensity of service, high risk for further deterioration and high frequency of surveillance required.*     DVT prophylaxis: Lovenox Code Status: Full code presumed Family Communication: Discussed with son at the bedside [name] Consults called: None yet  Time spent: 65 minutes  CRITICAL CARE Performed by: Nita Sells   Total critical care time: 25 minutes  Critical care time was exclusive of separately billable procedures and treating other patients.  Critical care was necessary to treat or prevent imminent or life-threatening deterioration.  Critical care was time spent personally by me on the following activities: development of treatment plan with patient and/or surrogate as well as nursing, discussions with consultants, evaluation of patient's response to treatment, examination of patient, obtaining history from  patient or surrogate, ordering and performing treatments and interventions, ordering and review of laboratory studies, ordering and review of radiographic studies, pulse oximetry and re-evaluation of patient's condition.   Verlon Au, MD  [facility] Direct contact: [phone] --Via amion app OR  --[email or web address]; password [email or web address]  7PM-7AM contact night coverage as above  06/17/2018, 4:38 PM

## 2018-06-17 NOTE — ED Triage Notes (Addendum)
Patient reports bilateral leg pain and lower back pain x2 days. Denies recent trauma. Patent reports bilateral leg swelling, on lasix. Patient denies any chest pain, SOB.  Hx multiple back surgeries, arthritis. Patient states back pain is chronic.   BP: 140/72 HR: 80 02: 96% CBG: 200 T: 97

## 2018-06-17 NOTE — ED Provider Notes (Signed)
[facility] DEPT Provider Note   CSN: [id number] Arrival date & time: 06/17/18  1050     History   Chief Complaint Chief Complaint  Patient presents with  . Leg Pain  . Leg Swelling  . Back Pain    HPI Melanie Mcdonald is a 84 y.o. female.  84yo F w/ extensive PMH including CHF, CAD, CVA, PVD, COPD, T2DM, chronic back pain who p/w leg swelling.  Patient had an injury to her right lower leg earlier this year that required months of wound care and she has done a course of Keflex for cellulitis.  She has continued to have lower extremity edema, has wanted to try compression stockings in the past but has not been able to get any to fit.  She thinks that her legs are slightly more swollen than usual.  Her weight fluctuates.  She reports some dyspnea on exertion that is chronic, may be slightly worse recently.  She denies any associated chest pain with the shortness of breath.  She has chronic back pain for which she rarely takes narcotic pain medicine.  This morning, her chronic back pain became more severe and she took 2 oxycodone with no significant relief.  She states that she has been bending over at home and thinks that this exacerbated her pain.  No lower extremity numbness or weakness.  She has chronic problems with incontinence that have not changed recently.  She reports compliance with her medications.  The history is provided by the patient.  Leg Pain    Back Pain   Associated symptoms include leg pain.    Past Medical History:  Diagnosis Date  . ANEMIA 01/30/2008  . ARTHRITIS 12/11/2007  . BACK PAIN, CHRONIC 06/23/2008  . BREAST CYST, RIGHT 12/17/2007  . Cancer ([place])   . CHF (congestive heart failure) ([place])   . Colon polyps    FRAGMENTS OF HYPERPLASTIC POLYP  . CONTACT DERMATITIS 03/10/2009  . COPD (chronic obstructive pulmonary disease) ([place])   . CORONARY ARTERY DISEASE 03/01/2007   had a normal Myoview stress test 07-13-11  . CYSTOCELE WITHOUT  MENTION UTERINE PROLAPSE LAT 09/12/2010  . DEGENERATIVE JOINT DISEASE 08/07/2007  . DEPRESSION 03/01/2007  . DIABETES MELLITUS, TYPE II 08/26/2008  . Diverticulosis of colon (without mention of hemorrhage)   . Esophageal reflux 11/06/2007  . HYPERKERATOSIS 06/02/2009  . HYPERLIPIDEMIA 08/26/2008  . HYPERTENSION 03/01/2007  . Irritable bowel syndrome 01/26/2009  . LABYRINTHITIS 11/03/2009  . 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 NEPHROLITHIASIS 04/29/2009  . OBESITY 03/01/2007  . PERIPHERAL VASCULAR DISEASE 01/26/2009  . Stroke ([place])   . SYNCOPE 08/26/2008   had brain MRI on 06-28-12 showing only chronic microvascular ischemia and atrophy   . TRANSIENT ISCHEMIC ATTACK 10/20/2009   had normal brain MRA with patent vertebrals and carotids 06-28-12    Patient Active Problem List   Diagnosis Date Noted  . Osteoarthritis 03/15/2018  . CHF (congestive heart failure), NYHA class II, chronic, systolic ([place]) 42/70/6237  . CVA (cerebral vascular accident) ([place]) 07/20/2017  . Candidiasis of genitalia in female 07/20/2017  . Chest pain 07/20/2017  . Uncontrolled type 2 diabetes mellitus with hyperglycemia, with long-term current use of insulin ([place]) 10/09/2016  . Cellulitis of right foot 06/13/2016  . Gout 06/07/2016  . Leg swelling 04/05/2016  . Pain in joint, ankle and foot 12/24/2015  . Diabetic polyneuropathy associated with diabetes mellitus due to underlying condition ([place]) 06/14/2015  . Cervicogenic headache 06/14/2015  . Weakness generalized  11/13/2014  . TIA (transient ischemic attack) 11/13/2014  . Gait instability 11/13/2014  . Acute on chronic diastolic CHF (congestive heart failure), NYHA class 3 ([place]) 11/03/2014  . Obstructive apnea 11/03/2014  . DOE (dyspnea on exertion) 11/03/2014  . Thyroid nodule 04/28/2014  . Personal history of colonic polyps 03/09/2014  . Abdominal pain, unspecified site 03/09/2014  . Dizziness 02/10/2014  . Memory loss 12/30/2013  . Headache  12/30/2013  . Hypokalemia 08/15/2011  . Diarrhea 08/15/2011  . Cellulitis of left leg 08/14/2011  . Chest pain 06/08/2011  . HIP PAIN 09/15/2010  . CYSTOCELE WITHOUT MENTION UTERINE PROLAPSE LAT 09/12/2010  . Labyrinthitis 11/03/2009  . NEPHROLITHIASIS 04/29/2009  . Peripheral vascular disease ([place]) 01/26/2009  . IRRITABLE BOWEL SYNDROME 01/26/2009  . Edema 09/02/2008  . Hyperlipidemia 08/26/2008  . Narcolepsy without cataplexy 08/26/2008  . Backache 06/23/2008  . Insomnia 12/11/2007  . Esophageal reflux 11/06/2007  . OBESITY 03/01/2007  . Depression, recurrent ([place]) 03/01/2007  . SYNDROME, RESTLESS LEGS 03/01/2007  . Essential hypertension 03/01/2007  . Coronary atherosclerosis 03/01/2007    Past Surgical History:  Procedure Laterality Date  . ABDOMINAL HYSTERECTOMY    . BACK SURGERY     x2  . CARDIAC CATHETERIZATION  07/2009  . CATARACT EXTRACTION    . COLONOSCOPY  11-13-07   per Dr. Deatra Ina, benign polyps, repeat in 5 yrs  . ESOPHAGOGASTRODUODENOSCOPY  11-13-07   per Dr. Deatra Ina, normal   . INTRAOCULAR LENS INSERTION    . LOOP RECORDER INSERTION N/A 07/23/2017   Procedure: LOOP RECORDER INSERTION;  Surgeon: Evans Lance, MD;  Location: [facility];  Service: Cardiovascular;  Laterality: N/A;  . SPINE SURGERY     x 3  . TONSILECTOMY, ADENOIDECTOMY, BILATERAL MYRINGOTOMY AND TUBES       OB History   None      Home Medications    Prior to Admission medications   Medication Sig Start Date End Date Taking? Authorizing Provider  cephALEXin (KEFLEX) 500 MG capsule Take 1 capsule (500 mg total) by mouth 4 (four) times daily. 06/07/18  Yes Laurey Morale, MD  clopidogrel (PLAVIX) 75 MG tablet Take 1 tablet (75 mg total) by mouth daily. 05/06/18  Yes Cameron Sprang, MD  furosemide (LASIX) 40 MG tablet Take 1 tablet (40 mg total) by mouth 2 (two) times daily with breakfast and lunch. 02/01/18  Yes Laurey Morale, MD  HUMALOG KWIKPEN 100 UNIT/ML KiwkPen Inject 0.2 mLs  (20 Units total) into the skin 3 (three) times daily before meals. 06/07/18  Yes Laurey Morale, MD  insulin degludec (TRESIBA FLEXTOUCH) 100 UNIT/ML SOPN FlexTouch Pen Inject 0.8 mLs (80 Units total) into the skin every morning. Patient taking differently: Inject 100 Units into the skin every morning.  06/07/18  Yes Laurey Morale, MD  meclizine (ANTIVERT) 12.5 MG tablet Take 1 tablet (12.5 mg total) by mouth 3 (three) times daily as needed for dizziness. Patient taking differently: Take 25 mg by mouth 3 (three) times daily as needed for dizziness.  05/15/18  Yes Laurey Morale, MD  ondansetron (ZOFRAN) 8 MG tablet Take 1 tablet (8 mg total) by mouth every 8 (eight) hours as needed for nausea or vomiting. 12/27/17  Yes Laurey Morale, MD  Alpha-D-Galactosidase (BEANO PO) Take by mouth daily as needed.    [provider]  cyclobenzaprine (FLEXERIL) 10 MG tablet Take 10 mg by mouth as needed for muscle spasms.    [provider]  glucose blood (ONE TOUCH ULTRA TEST) test strip 1 each by Other route as needed for other. Use as instructed    [provider]  Insulin Pen Needle (NOVOFINE) 32G X 6 MM MISC by Does not apply route 3 (three) times daily.    [provider]  loperamide (IMODIUM) 2 MG capsule Take by mouth as needed for diarrhea or loose stools.    [provider]  pramipexole (MIRAPEX) 1 MG tablet TAKE 6 TABLETS BY MOUTH DAILY Patient taking differently: Take 4 mg by mouth daily.  05/06/18   Laurey Morale, MD  spironolactone (ALDACTONE) 25 MG tablet TAKE 1 TABLET BY MOUTH EVERY DAY 05/21/18   Dorothy Spark, MD  terbinafine (LAMISIL) 250 MG tablet Take 1 tablet (250 mg total) by mouth daily. 06/07/18   Laurey Morale, MD  zolpidem (AMBIEN) 10 MG tablet Take 1 tablet (10 mg total) by mouth at bedtime as needed for sleep. 05/03/18 06/02/18  Laurey Morale, MD    Family History Family History  Problem Relation Age of Onset  . Lupus Mother   . COPD Father      Social History Social History   Tobacco Use  . Smoking status: Never Smoker  . Smokeless tobacco: Never Used  Substance Use Topics  . Alcohol use: No    Alcohol/week: 0.0 standard drinks  . Drug use: No    Types: Oxycodone     Allergies   Lyrica [pregabalin]; Aspirin; Ciprofloxacin; Clarithromycin; Doxycycline; Erythromycin; Lisinopril; Macrobid [nitrofurantoin monohyd macro]; Metolazone; Other; Penicillins; Sulfonamide derivatives; and Valtrex [valacyclovir hcl]   Review of Systems Review of Systems  Musculoskeletal: Positive for back pain.   All other systems reviewed and are negative except that which was mentioned in HPI   Physical Exam Updated Vital Signs BP (!) 144/64 (BP Location: Left Arm)   Pulse 76   Temp 99.6 F (37.6 C) (Oral)   Resp 20   Ht 5\' 7"  (1.702 m)   Wt 97.5 kg   SpO2 93%   BMI 33.67 kg/m   Physical Exam  Constitutional: She is oriented to person, place, and time. She appears well-developed and well-nourished. No distress.  HENT:  Head: Normocephalic and atraumatic.  Moist mucous membranes  Eyes: Conjunctivae are normal.  Neck: Neck supple.  Cardiovascular: Normal rate, regular rhythm and intact distal pulses.  Murmur heard. Pulmonary/Chest: Effort normal and breath sounds normal.  Abdominal: Soft. Bowel sounds are normal. She exhibits no distension. There is no tenderness.  Musculoskeletal: She exhibits edema.  2+ pitting edema BLE, R>L  Neurological: She is alert and oriented to person, place, and time.  Fluent speech  Skin: Skin is warm and dry.  Faint erythema and chronic venous stasis changes of skin on b/l shins  Psychiatric: She has a normal mood and affect. Judgment normal.  Nursing note and vitals reviewed.    ED Treatments / Results  Labs (all labs ordered are listed, but only abnormal results are displayed) Labs Reviewed  COMPREHENSIVE METABOLIC PANEL  BRAIN NATRIURETIC PEPTIDE  CBC  TROPONIN I     EKG None  Radiology Dg Chest 2 View  Result Date: 06/17/2018 CLINICAL DATA:  Lower extremity edema EXAM: CHEST - 2 VIEW COMPARISON:  10/18/2017 FINDINGS: Cardiac shadow is mildly enlarged but stable. Lungs are well aerated bilaterally. No focal infiltrate is seen. Central vascular congestion is noted. IMPRESSION: Central vascular congestion without pulmonary edema. Electronically Signed   By: Inez Catalina M.D.   On: 06/17/2018  12:28    Procedures .Critical Care Performed by: Sharlett Iles, MD Authorized by: Sharlett Iles, MD   Critical care provider statement:    Critical care time (minutes):  40   Critical care time was exclusive of:  Separately billable procedures and treating other patients   Critical care was necessary to treat or prevent imminent or life-threatening deterioration of the following conditions:  Respiratory failure   Critical care was time spent personally by me on the following activities:  Development of treatment plan with patient or surrogate, evaluation of patient's response to treatment, examination of patient, obtaining history from patient or surrogate, ordering and performing treatments and interventions, ordering and review of laboratory studies, ordering and review of radiographic studies, re-evaluation of patient's condition and review of old charts   (including critical care time)  Medications Ordered in ED Medications - No data to display   Initial Impression / Assessment and Plan / ED Course  I have reviewed the triage vital signs and the nursing notes.  Pertinent labs & imaging results that were available during my care of the patient were reviewed by me and considered in my medical decision making (see chart for details).    Comfortable, no acute distress on exam with reassuring vital signs.  O2 saturation mid 90s on room air.  Her faint redness on her legs appears chronic, I do not suspect acute cellulitis.  Chest x-ray shows  central vascular congestion without pulmonary edema. DVT US negative b/l.   Later in ED course, pt had sudden onset of respiratory distress and became hypoxic. When I arrived at room, she was tachypneic, increased WOB with accessory muscle use, crackles bilaterally with sharp spike in blood pressure. I suspect flash pulmonary edema. Placed on bipap and gave NTG paste which significantly improved symptoms. Gave IV lasix. Discussed admission w/ Dr. Verlon Au, [facility], and pt admitted for further care. Final Clinical Impressions(s) / ED Diagnoses   Final diagnoses:  None    ED Discharge Orders    None       Little, Wenda Overland, MD 06/17/18 2112

## 2018-06-17 NOTE — ED Notes (Signed)
Report given to [facility] RN  ED TO INPATIENT HANDOFF REPORT  Name/Age/Gender Melanie Mcdonald 84 y.o. female  Code Status Code Status History    Date Active Date Inactive Code Status Order ID Comments User Context   10/18/2017 2114 10/21/2017 1654 DNR [id number]  [name], MD ED   07/19/2017 2150 07/24/2017 1950 DNR [id number]  [name], MD ED   10/09/2016 1933 10/11/2016 2158 Full Code [id number]  [name], MD Inpatient   08/14/2011 2043 08/16/2011 1905 Full Code [id number]  Widener, [name], RN Inpatient    Questions for Most Recent Historical Code Status (Order [id number])    Question Answer Comment   In the event of cardiac or respiratory ARREST Do not call a "code blue"    In the event of cardiac or respiratory ARREST Do not perform Intubation, CPR, defibrillation or ACLS    In the event of cardiac or respiratory ARREST Use medication by any route, position, wound care, and other measures to relive pain and suffering. May use oxygen, suction and manual treatment of airway obstruction as needed for comfort.         Advance Directive Documentation     Most Recent Value  Type of Advance Directive  Healthcare Power of Attorney, Living will  Pre-existing out of facility DNR order (yellow form or pink MOST form)  -  "MOST" Form in Place?  -      Home/SNF/Other Home  Chief Complaint leg pain  Level of Care/Admitting Diagnosis ED Disposition    ED Disposition Condition [place]: [name] [100102]  Level of Care: Stepdown [14]  Admit to SDU based on following criteria: Respiratory Distress:  Frequent assessment and/or intervention to maintain adequate ventilation/respiration, pulmonary toilet, and respiratory treatment.  Diagnosis: Acute respiratory failure ([place]) [518.81.ICD-9-CM]  Admitting Physician: Nita Sells [[id number]]  Attending Physician: Nita Sells [phone]  Estimated length of stay: past midnight  tomorrow  Certification:: I certify this patient will need inpatient services for at least 2 midnights  PT Class (Do Not Modify): Inpatient [101]  PT Acc Code (Do Not Modify): Private [1]       Medical History Past Medical History:  Diagnosis Date  . ANEMIA 01/30/2008  . ARTHRITIS 12/11/2007  . BACK PAIN, CHRONIC 06/23/2008  . BREAST CYST, RIGHT 12/17/2007  . Cancer ([place])   . CHF (congestive heart failure) ([place])   . Colon polyps    FRAGMENTS OF HYPERPLASTIC POLYP  . CONTACT DERMATITIS 03/10/2009  . COPD (chronic obstructive pulmonary disease) ([place])   . CORONARY ARTERY DISEASE 03/01/2007   had a normal Myoview stress test 07-13-11  . CYSTOCELE WITHOUT MENTION UTERINE PROLAPSE LAT 09/12/2010  . DEGENERATIVE JOINT DISEASE 08/07/2007  . DEPRESSION 03/01/2007  . DIABETES MELLITUS, TYPE II 08/26/2008  . Diverticulosis of colon (without mention of hemorrhage)   . Esophageal reflux 11/06/2007  . HYPERKERATOSIS 06/02/2009  . HYPERLIPIDEMIA 08/26/2008  . HYPERTENSION 03/01/2007  . Irritable bowel syndrome 01/26/2009  . LABYRINTHITIS 11/03/2009  . 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 NEPHROLITHIASIS 04/29/2009  . OBESITY 03/01/2007  . PERIPHERAL VASCULAR DISEASE 01/26/2009  . Stroke ([place])   . SYNCOPE 08/26/2008   had brain MRI on 06-28-12 showing only chronic microvascular ischemia and atrophy   . TRANSIENT ISCHEMIC ATTACK 10/20/2009   had normal brain MRA with patent vertebrals and carotids 06-28-12    Allergies Allergies  Allergen Reactions  . Lyrica [Pregabalin] Other (See Comments)    Suicidal   .  Aspirin Other (See Comments)    Abdominal pain  . Ciprofloxacin Nausea And Vomiting    Made pt very sick on stomach  . Clarithromycin Other (See Comments)    Made stomach "burn"  . Doxycycline Itching  . Erythromycin Nausea And Vomiting  . Lisinopril Cough  . Macrobid [Nitrofurantoin Monohyd Macro] Nausea And Vomiting  . Metolazone Other (See Comments)    Pt stated this made her  B/P drop  . Other Nausea And Vomiting    Pt cannot have any of the -mycin(s)  . Penicillins Swelling    From childhood: swelling @ injection site,childhood allergy Has patient had a PCN reaction causing immediate rash, facial/tongue/throat swelling, SOB or lightheadedness with hypotension: Yes Has patient had a PCN reaction causing severe rash involving mucus membranes or skin necrosis: No Has patient had a PCN reaction that required hospitalization: No Has patient had a PCN reaction occurring within the last 10 years: No If all of the above answers are "NO", then may proceed with Cephalosporin use.  . Sulfonamide Derivatives Nausea And Vomiting  . Valtrex [Valacyclovir Hcl] Nausea Only    IV Location/Drains/Wounds Patient Lines/Drains/Airways Status   Active Line/Drains/Airways    Name:   Placement date:   Placement time:   Site:   Days:   Wound / Incision (Open or Dehisced) 07/19/17 Diabetic ulcer Leg Left;Right;Lower    07/19/17    2230    Leg   333   Wound / Incision (Open or Dehisced) 10/19/17 Non-pressure wound Leg Right;Lateral   10/19/17    0035    Leg   241   Wound / Incision (Open or Dehisced) 10/19/17 Diabetic ulcer Toe (Comment  which one) Right;Lateral dry diabetic ulcer   10/19/17    0035    Toe (Comment  which one)   241          Labs/Imaging Results for orders placed or performed during the hospital encounter of 06/17/18 (from the past 48 hour(s))  Comprehensive metabolic panel     Status: Abnormal   Collection Time: 06/17/18  1:06 PM  Result Value Ref Range   Sodium 142 135 - 145 mmol/L   Potassium 4.1 3.5 - 5.1 mmol/L   Chloride 105 98 - 111 mmol/L   CO2 30 22 - 32 mmol/L   Glucose, Bld 168 (H) 70 - 99 mg/dL   BUN 17 8 - 23 mg/dL   Creatinine, Ser 0.78 0.44 - 1.00 mg/dL   Calcium 8.6 (L) 8.9 - 10.3 mg/dL   Total Protein 6.8 6.5 - 8.1 g/dL   Albumin 3.5 3.5 - 5.0 g/dL   AST 24 15 - 41 U/L   ALT 18 0 - 44 U/L   Alkaline Phosphatase 73 38 - 126 U/L   Total  Bilirubin 0.3 0.3 - 1.2 mg/dL   GFR calc non Af Amer >60 >60 mL/min   GFR calc Af Amer >60 >60 mL/min    Comment: (NOTE) The eGFR has been calculated using the CKD EPI equation. This calculation has not been validated in all clinical situations. eGFR's persistently <60 mL/min signify possible Chronic Kidney Disease.    Anion gap 7 5 - 15    Comment: Performed at [facility], [place] [address] [place], [place] [address]  Brain natriuretic peptide     Status: Abnormal   Collection Time: 06/17/18  1:06 PM  Result Value Ref Range   B Natriuretic Peptide 321.7 (H) 0.0 - 100.0 pg/mL    Comment:  Performed at [facility], [place] [address] [place], [place] [address]  CBC     Status: None   Collection Time: 06/17/18  1:06 PM  Result Value Ref Range   WBC 4.7 4.0 - 10.5 K/uL   RBC 4.17 3.87 - 5.11 MIL/uL   Hemoglobin 12.4 12.0 - 15.0 g/dL   HCT 38.8 36.0 - 46.0 %   MCV 93.0 78.0 - 100.0 fL   MCH 29.7 26.0 - 34.0 pg   MCHC 32.0 30.0 - 36.0 g/dL   RDW 14.3 11.5 - 15.5 %   Platelets 170 150 - 400 K/uL    Comment: Performed at [facility], [place] [address] [place], [place] [address]  Troponin I     Status: Abnormal   Collection Time: 06/17/18  1:06 PM  Result Value Ref Range   Troponin I 0.03 (HH) <0.03 ng/mL    Comment: CRITICAL RESULT CALLED TO, READ BACK BY AND VERIFIED WITHMarjie Skiff RN AT 1407 06/17/18 MULLINS,T Performed at [facility], [place] [address], [place], [place] [address]   CBG monitoring, ED     Status: Abnormal   Collection Time: 06/17/18  1:47 PM  Result Value Ref Range   Glucose-Capillary 163 (H) 70 - 99 mg/dL  Blood gas, arterial ([facility] & [facility] ONLY)     Status: Abnormal   Collection Time: 06/17/18  4:13 PM  Result Value Ref Range   FIO2 0.40    Delivery systems BILEVEL POSITIVE AIRWAY PRESSURE    Inspiratory PAP 12    Expiratory PAP 6    pH, Arterial 7.401 7.350 - 7.450   pCO2 arterial 44.0 32.0  - 48.0 mmHg   pO2, Arterial 99.5 83.0 - 108.0 mmHg   Bicarbonate 26.7 20.0 - 28.0 mmol/L   Acid-Base Excess 2.1 (H) 0.0 - 2.0 mmol/L   O2 Saturation 97.3 %   Patient temperature 37.0    Collection site LEFT RADIAL    Drawn by [id number]    Sample type ARTERIAL DRAW    Allens test (pass/fail) PASS PASS    Comment: Performed at [place] [address] [place], [place] [address]   Dg Chest 2 View  Result Date: 06/17/2018 CLINICAL DATA:  Lower extremity edema EXAM: CHEST - 2 VIEW COMPARISON:  10/18/2017 FINDINGS: Cardiac shadow is mildly enlarged but stable. Lungs are well aerated bilaterally. No focal infiltrate is seen. Central vascular congestion is noted. IMPRESSION: Central vascular congestion without pulmonary edema. Electronically Signed   By: Inez Catalina M.D.   On: 06/17/2018 12:28    Pending Labs [employer] (From admission, onward)    Start     Ordered   Signed and Held  CBC  (enoxaparin (LOVENOX)    CrCl >/= 30 ml/min)  Once,   R    Comments:  Baseline for enoxaparin therapy IF NOT ALREADY DRAWN.  Notify MD if PLT < 100 K.    Signed and Held   Signed and Held  Creatinine, serum  (enoxaparin (LOVENOX)    CrCl >/= 30 ml/min)  Once,   R    Comments:  Baseline for enoxaparin therapy IF NOT ALREADY DRAWN.    Signed and Held   Signed and Held  Creatinine, serum  (enoxaparin (LOVENOX)    CrCl >/= 30 ml/min)  Weekly,   R    Comments:  while on enoxaparin therapy    Signed and Held   Signed and Held  Comprehensive Building surveyor  morning,   R     Signed and Held   Signed and Held  CBC WITH DIFFERENTIAL  Tomorrow morning,   R     Signed and Held          Vitals/Pain Today's Vitals   06/17/18 1556 06/17/18 1558 06/17/18 1600 06/17/18 1700  BP:    (!) 150/63  Pulse:   71 75  Resp:   16 16  Temp:      TempSrc:      SpO2:   100% 95%  Weight:      Height:      PainSc: 4  4       Isolation Precautions No active  isolations  Medications Medications  ipratropium-albuterol (DUONEB) 0.5-2.5 (3) MG/3ML nebulizer solution 3 mL (3 mLs Nebulization Not Given 06/17/18 1352)  oxyCODONE-acetaminophen (PERCOCET/ROXICET) 5-325 MG per tablet 1 tablet (1 tablet Oral Given 06/17/18 1332)  nitroGLYCERIN (NITROGLYN) 2 % ointment 1 inch (1 inch Topical Given 06/17/18 1404)  furosemide (LASIX) injection 40 mg (40 mg Intravenous Given 06/17/18 1404)  morphine 2 MG/ML injection 0.5 mg (0.5 mg Intravenous Given 06/17/18 1557)    Mobility walks with person assist

## 2018-06-18 ENCOUNTER — Inpatient Hospital Stay (HOSPITAL_COMMUNITY): Payer: Medicare Other

## 2018-06-18 DIAGNOSIS — I340 Nonrheumatic mitral (valve) insufficiency: Secondary | ICD-10-CM

## 2018-06-18 DIAGNOSIS — I5030 Unspecified diastolic (congestive) heart failure: Secondary | ICD-10-CM

## 2018-06-18 LAB — CBC WITH DIFFERENTIAL/PLATELET
Basophils Absolute: 0 10*3/uL (ref 0.0–0.1)
Basophils Relative: 0 %
EOS ABS: 0 10*3/uL (ref 0.0–0.7)
EOS PCT: 1 %
HCT: 35.4 % — ABNORMAL LOW (ref 36.0–46.0)
Hemoglobin: 11.3 g/dL — ABNORMAL LOW (ref 12.0–15.0)
LYMPHS ABS: 1.5 10*3/uL (ref 0.7–4.0)
LYMPHS PCT: 47 %
MCH: 29.6 pg (ref 26.0–34.0)
MCHC: 31.9 g/dL (ref 30.0–36.0)
MCV: 92.7 fL (ref 78.0–100.0)
MONOS PCT: 9 %
Monocytes Absolute: 0.3 10*3/uL (ref 0.1–1.0)
Neutro Abs: 1.4 10*3/uL — ABNORMAL LOW (ref 1.7–7.7)
Neutrophils Relative %: 43 %
PLATELETS: 148 10*3/uL — AB (ref 150–400)
RBC: 3.82 MIL/uL — ABNORMAL LOW (ref 3.87–5.11)
RDW: 14.4 % (ref 11.5–15.5)
WBC: 3.2 10*3/uL — AB (ref 4.0–10.5)

## 2018-06-18 LAB — COMPREHENSIVE METABOLIC PANEL
ALBUMIN: 3.1 g/dL — AB (ref 3.5–5.0)
ALT: 17 U/L (ref 0–44)
AST: 21 U/L (ref 15–41)
Alkaline Phosphatase: 60 U/L (ref 38–126)
Anion gap: 7 (ref 5–15)
BUN: 24 mg/dL — AB (ref 8–23)
CO2: 28 mmol/L (ref 22–32)
CREATININE: 0.87 mg/dL (ref 0.44–1.00)
Calcium: 8 mg/dL — ABNORMAL LOW (ref 8.9–10.3)
Chloride: 102 mmol/L (ref 98–111)
GFR calc Af Amer: >60 mL/min (ref >60)
GFR calc non Af Amer: >60 mL/min (ref >60)
GLUCOSE: 228 mg/dL — AB (ref 70–99)
POTASSIUM: 4.1 mmol/L (ref 3.5–5.1)
SODIUM: 137 mmol/L (ref 135–145)
Total Bilirubin: 0.5 mg/dL (ref 0.3–1.2)
Total Protein: 6 g/dL — ABNORMAL LOW (ref 6.5–8.1)

## 2018-06-18 LAB — CUP PACEART REMOTE DEVICE CHECK
Date Time Interrogation Session: 20190805013905
Implantable Pulse Generator Implant Date: 20181022

## 2018-06-18 LAB — GLUCOSE, CAPILLARY
GLUCOSE-CAPILLARY: 90 mg/dL (ref 70–99)
Glucose-Capillary: 78 mg/dL (ref 70–99)
Glucose-Capillary: 163 mg/dL — ABNORMAL HIGH (ref 70–99)

## 2018-06-18 LAB — ECHOCARDIOGRAM COMPLETE
Height: 67 in
WEIGHTICAEL: 3440 [oz_av]

## 2018-06-18 LAB — TROPONIN I
TROPONIN I: 0.05 ng/mL — AB (ref <0.03)
TROPONIN I: 0.04 ng/mL — AB (ref <0.03)
TROPONIN I: 0.03 ng/mL — AB (ref <0.03)

## 2018-06-18 MED ORDER — OXYCODONE-ACETAMINOPHEN 5-325 MG PO TABS
1.0000 | ORAL_TABLET | Freq: Four times a day (QID) | ORAL | Status: DC | PRN
  Administered 2018-06-18 – 2018-06-19 (×3): 1 via ORAL
  Filled 2018-06-18: qty 2
  Filled 2018-06-18 (×2): qty 1

## 2018-06-18 MED ORDER — NALOXONE HCL 0.4 MG/ML IJ SOLN
INTRAMUSCULAR | Status: AC
  Filled 2018-06-18: qty 4

## 2018-06-18 MED ORDER — ACETAMINOPHEN 325 MG PO TABS: 650.0000 mg | ORAL_TABLET | ORAL | Status: DC | PRN

## 2018-06-18 NOTE — Care Management Note (Signed)
Case Management Note  Patient Details  Name: Melanie Mcdonald MRN: [id number] Date of Birth: 20-Apr-1933  Subjective/Objective:                  Pulmonary edema  Action/Plan: Will follow for progression of care and clinical status. Will follow for case management needs none present at this time.  Expected Discharge Date:  (unknown)               Expected Discharge Plan:  Home/Self Care  In-House Referral:     Discharge planning Services  CM Consult  Post Acute Care Choice:    Choice offered to:     DME Arranged:    DME Agency:     HH Arranged:    HH Agency:     Status of Service:  In process, will continue to follow  If discussed at Long Length of Stay Meetings, dates discussed:    Additional Comments:  Leeroy Cha, RN 06/18/2018, 8:35 AM

## 2018-06-18 NOTE — Progress Notes (Signed)
  Echocardiogram 2D Echocardiogram has been performed.  Melanie Mcdonald 06/18/2018, 3:44 PM

## 2018-06-18 NOTE — Progress Notes (Signed)
[facility]                                                                              Patient Demographics  Melanie Mcdonald, is a 84 y.o. female, DOB - [birth year], BBC:[id number]  Admit date - 06/17/2018   Admitting Physician Nita Sells, MD  Outpatient Primary MD for the patient is Laurey Morale, MD  Outpatient specialists:   LOS - 1  days   Medical records reviewed and are as summarized below:    Chief Complaint  Patient presents with  . Leg Pain  . Leg Swelling  . Back Pain       Brief summary  Patient is a 84 year old female with history of hypertension, diastolic CHF, hyperlipidemia, prior TIA presented with bilateral leg pain and low back pain for the last 2 days.  She has been on Keflex for cellulitis on her legs and stated that her legs had been more swollen with redness.  She had injury to her right lower leg earlier this year requiring wound care and antibiotics.  Patient also reported dyspnea on exertion, worse recently.  On the morning of admission, chronic pack pain became more severe. While in ED, patient had sudden onset of respiratory distress and became hypoxic, tachypneic with increased work of breathing, crackles bilaterally.  Patient was given IV Lasix, NTG paste and placed on BiPAP.  She was admitted for further work-up.  Assessment & Plan    Principal problem Acute respiratory failure with hypoxia secondary to acute flash pulmonary edema, acute on chronic diastolic CHF -Currently feeling better, off BiPAP, on O2 2 L (does not wear O2 at home) -BNP 321, patient was placed on IV Lasix, currently on 60 mg IV every 12 hours -Follow strict I's and O's and daily weights -Follow 2D echocardiogram -Repeat chest x-ray showed persistent pleural effusion, continue IV Lasix today  Active problems Elevated troponins  -Currently no chest pain, likely due to #1, demand ischemia -Follow serial cardiac enzymes, 2D echo  Acute on chronic  back pain -Lumbar spine x-ray showed degenerative disc disease L3-L4 and L4-L5, no compression fracture -Continue pain control,   Prior CVA -Continue Plavix  OSA -Continue CPAP  History of TIA - cont plavix   B/L LE edema, cellulitis  - Doppler US showed no DVT    - cont keflex   Code Status: Full code DVT Prophylaxis:  Lovenox  Family Communication: Discussed in detail with the patient, all imaging results, lab results explained to the patient    Disposition Plan: Transfer to telemetry floor  Time Spent in minutes 35 minutes  Procedures:  None  Consultants:   None  Antimicrobials:   Keflex   Medications  Scheduled Meds: . cephALEXin  500 mg Oral QID  . clopidogrel  75 mg Oral Daily  . enoxaparin (LOVENOX) injection  40 mg Subcutaneous Q24H  . furosemide  60 mg Intravenous Q12H  . insulin aspart  20 Units Subcutaneous TID AC  . insulin glargine  80 Units Subcutaneous q morning - 10a  . ipratropium-albuterol  3 mL Nebulization Once  . pramipexole  4 mg Oral Daily  .  sodium chloride flush  3 mL Intravenous Q12H  . spironolactone  25 mg Oral Daily  . terbinafine  250 mg Oral Daily   Continuous Infusions: PRN Meds:.cyclobenzaprine, ondansetron, zolpidem   Antibiotics   Anti-infectives (From admission, onward)   Start     Dose/Rate Route Frequency Ordered Stop   06/17/18 2200  cephALEXin (KEFLEX) capsule 500 mg     500 mg Oral 4 times daily 06/17/18 1819     06/17/18 2000  terbinafine (LAMISIL) tablet 250 mg     250 mg Oral Daily 06/17/18 1819          Subjective:   Melanie Mcdonald was seen and examined today.  Off BiPAP, feels a lot better today, shortness of breath is improving.  Still has back pain, 5/10, intermittent.  Patient denies dizziness, chest pain, shortness of breath, abdominal pain, N/V/D/C, new weakness, numbess, tingling. No acute events overnight.    Objective:   Vitals:   06/18/18 1000 06/18/18 1005 06/18/18 1010 06/18/18 1015    BP: (!) 100/36  (!) 104/33   Pulse: 64 63 64 64  Resp: 20 (!) 21 18 20   Temp:      TempSrc:      SpO2: 91% 94% 92% 93%  Weight:      Height:       No intake or output data in the 24 hours ending 06/18/18 1144   Wt Readings from Last 3 Encounters:  06/17/18 97.5 kg  06/07/18 101 kg  05/03/18 96.5 kg     Exam  General: Alert and oriented x 3, NAD  Eyes:   HEENT:  Atraumatic, normocephalic  Cardiovascular: S1 S2 auscultated, no rubs, murmurs or gallops. Regular rate and rhythm.  Respiratory: Decreased breath sound at the bases  Gastrointestinal: Obese, soft, nontender, nondistended, + bowel sounds  Ext: 2+ edema with chronic venous stasis changes, faint erythema bilateral  Neuro: no new deficit  Musculoskeletal: No digital cyanosis, clubbing  Skin: No rashes  Psych: Normal affect and demeanor, alert and oriented x3    Data Reviewed:  I have personally reviewed following labs and imaging studies  Micro Results Recent Results (from the past 240 hour(s))  MRSA PCR Screening     Status: Abnormal   Collection Time: 06/17/18  6:22 PM  Result Value Ref Range Status   MRSA by PCR POSITIVE (A) NEGATIVE Final    Comment:        The GeneXpert MRSA Assay (FDA approved for NASAL specimens only), is one component of a comprehensive MRSA colonization surveillance program. It is not intended to diagnose MRSA infection nor to guide or monitor treatment for MRSA infections. RESULT CALLED TO, READ BACK BY AND VERIFIED WITH: MCDONALD,C. RN @2056  ON 09.16.19 BY COHEN,K Performed at [facility], [place] [address], [place],  [address]     Radiology Reports X-ray Chest Pa And Lateral  Result Date: 06/18/2018 CLINICAL DATA:  Chronic low back pain; follow-up CHF and generalized weakness. EXAM: CHEST - 2 VIEW COMPARISON:  PA and lateral chest x-ray of June 17, 2018 FINDINGS: The lungs remain hyperinflated. The interstitial markings remain mildly  increased. The pulmonary vascularity is less engorged. The heart is top-normal in size. There is calcification in the wall of the aortic arch. There is small pleural effusions layering posteriorly. IMPRESSION: CHF superimposed upon COPD. Decreased pulmonary interstitial edema. Persistent small posterior layering pleural effusions. Thoracic aortic atherosclerosis. Electronically Signed   By: David  Martinique M.D.   On: 06/18/2018 09:33  Dg Chest 2 View  Result Date: 06/17/2018 CLINICAL DATA:  Lower extremity edema EXAM: CHEST - 2 VIEW COMPARISON:  10/18/2017 FINDINGS: Cardiac shadow is mildly enlarged but stable. Lungs are well aerated bilaterally. No focal infiltrate is seen. Central vascular congestion is noted. IMPRESSION: Central vascular congestion without pulmonary edema. Electronically Signed   By: Inez Catalina M.D.   On: 06/17/2018 12:28   Dg Lumbar Spine Complete W/bend  Result Date: 06/18/2018 CLINICAL DATA:  Chronic low back pain with no known injury EXAM: LUMBAR SPINE - COMPLETE WITH BENDING VIEWS COMPARISON:  Coronal and sagittal reconstructed images through the lumbar spine from an abdominal and pelvic CT scan of Feb 02, 2016 FINDINGS: The lumbar vertebral bodies are preserved in height. The pedicles and transverse processes are grossly normal. There is moderate disc space narrowing at L4-5. There are anterior near bridging osteophytes at L2-3 and L3-4. There is no spondylolisthesis. The facet joints exhibit no acute abnormalities. IMPRESSION: Degenerative disc disease centered at L3-4 and L4-5. No compression fracture or spondylolisthesis. Electronically Signed   By: David  Martinique M.D.   On: 06/18/2018 09:36    Lab Data:  CBC: Recent Labs  Lab 06/17/18 1306 06/18/18 0315  WBC 4.7 3.2*  NEUTROABS  --  1.4*  HGB 12.4 11.3*  HCT 38.8 35.4*  MCV 93.0 92.7  PLT 170 382*   Basic Metabolic Panel: Recent Labs  Lab 06/17/18 1306 06/18/18 0315  NA 142 137  K 4.1 4.1  CL 105 102  CO2  30 28  GLUCOSE 168* 228*  BUN 17 24*  CREATININE 0.78 0.87  CALCIUM 8.6* 8.0*   GFR: Estimated Creatinine Clearance: 59.8 mL/min (by C-G formula based on SCr of 0.87 mg/dL). Liver Function Tests: Recent Labs  Lab 06/17/18 1306 06/18/18 0315  AST 24 21  ALT 18 17  ALKPHOS 73 60  BILITOT 0.3 0.5  PROT 6.8 6.0*  ALBUMIN 3.5 3.1*   No results for input(s): LIPASE, AMYLASE in the last 168 hours. No results for input(s): AMMONIA in the last 168 hours. Coagulation Profile: No results for input(s): INR, PROTIME in the last 168 hours. Cardiac Enzymes: Recent Labs  Lab 06/17/18 1306 06/18/18 0718  TROPONINI 0.03* 0.05*   BNP (last 3 results) Recent Labs    12/11/17 1112  PROBNP 159   HbA1C: No results for input(s): HGBA1C in the last 72 hours. CBG: Recent Labs  Lab 06/17/18 1347 06/18/18 1138  GLUCAP 163* 78   Lipid Profile: No results for input(s): CHOL, HDL, LDLCALC, TRIG, CHOLHDL, LDLDIRECT in the last 72 hours. Thyroid Function Tests: No results for input(s): TSH, T4TOTAL, FREET4, T3FREE, THYROIDAB in the last 72 hours. Anemia Panel: No results for input(s): VITAMINB12, FOLATE, FERRITIN, TIBC, IRON, RETICCTPCT in the last 72 hours. Urine analysis:    Component Value Date/Time   COLORURINE YELLOW 07/19/2017 1949   APPEARANCEUR CLEAR 07/19/2017 1949   LABSPEC 1.030 07/19/2017 1949   PHURINE 6.0 07/19/2017 1949   GLUCOSEU >=500 (A) 07/19/2017 1949   HGBUR NEGATIVE 07/19/2017 1949   HGBUR trace-lysed 09/12/2010 1538   BILIRUBINUR 2+  2 mg/dL 12/27/2017 1106   KETONESUR NEGATIVE 07/19/2017 1949   PROTEINUR +- 15 mg/dL 12/27/2017 1106   PROTEINUR NEGATIVE 07/19/2017 1949   UROBILINOGEN 4.0 (A) 12/27/2017 1106   UROBILINOGEN 0.2 11/10/2014 1225   NITRITE positive 12/27/2017 1106   NITRITE NEGATIVE 07/19/2017 1949   LEUKOCYTESUR Moderate (2+) (A) 12/27/2017 1106     Valentina Alcoser M.D. [facility] 06/18/2018, 11:44  AM  Pager: [phone] Between 7am to  7pm - call Pager - [phone]  After 7pm go to [email or web address] - password [email or web address]  Call night coverage person covering after 7pm

## 2018-06-18 NOTE — Plan of Care (Signed)
  Problem: Education: Goal: Knowledge of General Education information will improve Description: Including pain rating scale, medication(s)/side effects and non-pharmacologic comfort measures Outcome: Progressing   Problem: Health Behavior/Discharge Planning: Goal: Ability to manage health-related needs will improve Outcome: Progressing   Problem: Activity: Goal: Risk for activity intolerance will decrease Outcome: Progressing   Problem: Nutrition: Goal: Adequate nutrition will be maintained Outcome: Progressing   Problem: Elimination: Goal: Will not experience complications related to urinary retention Outcome: Progressing   Problem: Pain Managment: Goal: General experience of comfort will improve Outcome: Progressing   Problem: Safety: Goal: Ability to remain free from injury will improve Outcome: Progressing   Problem: Skin Integrity: Goal: Risk for impaired skin integrity will decrease Outcome: Progressing   

## 2018-06-19 DIAGNOSIS — J9601 Acute respiratory failure with hypoxia: Secondary | ICD-10-CM

## 2018-06-19 DIAGNOSIS — I5033 Acute on chronic diastolic (congestive) heart failure: Secondary | ICD-10-CM

## 2018-06-19 LAB — BASIC METABOLIC PANEL
ANION GAP: 8 (ref 5–15)
BUN: 24 mg/dL — AB (ref 8–23)
CALCIUM: 8.2 mg/dL — AB (ref 8.9–10.3)
CO2: 27 mmol/L (ref 22–32)
Chloride: 104 mmol/L (ref 98–111)
Creatinine, Ser: 0.76 mg/dL (ref 0.44–1.00)
GFR calc Af Amer: >60 mL/min (ref >60)
GFR calc non Af Amer: >60 mL/min (ref >60)
GLUCOSE: 122 mg/dL — AB (ref 70–99)
Potassium: 3.8 mmol/L (ref 3.5–5.1)
Sodium: 139 mmol/L (ref 135–145)

## 2018-06-19 LAB — GLUCOSE, CAPILLARY
GLUCOSE-CAPILLARY: 62 mg/dL — AB (ref 70–99)
GLUCOSE-CAPILLARY: 87 mg/dL (ref 70–99)
Glucose-Capillary: 119 mg/dL — ABNORMAL HIGH (ref 70–99)
Glucose-Capillary: 40 mg/dL — CL (ref 70–99)
Glucose-Capillary: 56 mg/dL — ABNORMAL LOW (ref 70–99)
Glucose-Capillary: 61 mg/dL — ABNORMAL LOW (ref 70–99)

## 2018-06-19 MED ORDER — CHLORHEXIDINE GLUCONATE CLOTH 2 % EX PADS: 6.0000 | MEDICATED_PAD | Freq: Every day | CUTANEOUS | Status: DC

## 2018-06-19 MED ORDER — GABAPENTIN 300 MG PO CAPS: 300.0000 mg | ORAL_CAPSULE | Freq: Three times a day (TID) | ORAL | 0 refills | Status: DC

## 2018-06-19 MED ORDER — INSULIN ASPART 100 UNIT/ML ~~LOC~~ SOLN: 0.0000 [IU] | Freq: Three times a day (TID) | SUBCUTANEOUS | Status: DC

## 2018-06-19 MED ORDER — MUPIROCIN 2 % EX OINT
1.0000 "application " | TOPICAL_OINTMENT | Freq: Two times a day (BID) | CUTANEOUS | Status: DC
  Administered 2018-06-19: 1 via NASAL
  Filled 2018-06-19: qty 22

## 2018-06-19 NOTE — Care Management Note (Signed)
Case Management Note  Patient Details  Name: Melanie Mcdonald MRN: [id number] Date of Birth: December 17, 1932  Subjective/Objective:  Patient has rw. [facility] confirmed active w/HHRN, sw-will add HHPT. Patient states her Son will pick her up for d/c. No further CM needs.                  Action/Plan:dc home w/HHC.   Expected Discharge Date:  (unknown)               Expected Discharge Plan:  [place]  In-House Referral:     Discharge planning Services  CM Consult  Post Acute Care Choice:  Home Health(Active [facility]-HHRN, Education officer, museum) Choice offered to:     DME Arranged:    DME Agency:     HH Arranged:  RN, PT, Social Work [employer] Agency:  [facility]  Status of Service:  Completed, signed off  If discussed at [employer], dates discussed:    Additional Comments:  Dessa Phi, RN 06/19/2018, 1:55 PM

## 2018-06-19 NOTE — Progress Notes (Signed)
PROGRESS NOTE  CAROLAN AVEDISIAN FGH:[id number] DOB: [birth year] DOA: 06/17/2018 PCP: Laurey Morale, MD   LOS: 2 days   Brief Narrative / Interim history: Melanie Mcdonald is a 84 y/o female with medical history significant for HTN, DM, diastolic CHF NYHA Class III, COPD, TIA w/ implantation of loop recorder (Oct 2018), CVA, and chronic back pain who presented to the ED 9/16 for worsening back pain unrelieved with home pain meds and LE edema. She was started on Keflex approximately 1 week ago for suspected cellulitis of lower extremities and noticed increased swelling. CXR revealed central vascular congestion without pulmonary edema. While in the ED, she had a sudden onset of respiratory distress and became hypoxic with increased WOB and accessory muscle use. PE revealed bilateral crackles and BP spiked up to 195/94. BNP was 321.7.  Flash pulmonary edema was suspected and critical care was consulted and she was placed on BiPAP and given NTG paste. She was given 40mg  Lasix x2 and morphine 0.5 for back pain. She showed significant improvement and after 2 hours, did not require oxygen and ABG 7.40. She was then started on 60mg  IV Lasix q12 hr and admitted for acute respiratory failure with hypoxia secondary to flash pulmonary edema in the setting of acute on chronic diastolic heart failure for further work-up.   Subjective: Patient states her breathing is much better and her dry cough has improved. Denies chest pain, palpitations, SOB.   Assessment & Plan: Active Problems:   Acute respiratory failure (HCC)  Acute respiratory failure with hypoxia secondary to flash pulmonary edema in the setting of acute on chronic diastolic heart failure - Patient stated she had been eating pumpkin seeds high in salt recently and does not take her medications every day, and did not take her home Lasix or aldactone  - CXR 9/16  central vascular congestion without pulmonary edema; Repeat CXR 9/17 showed CHF superimposed  COPD, decreased pulmonary interstitial edema, and persistent small pleural effusions - 2D echo 6/78LFYBO grade 2 diastolic dysfunction and LVEF 60-65% - Was on restarted on Aldactone and IV Lasix 60mg  q12h but patient refused Lasix at night  - Transitioned to room air O2 sats stable   - Pt's symptoms have greatly improved and lung exam minimal crackles at bases, will d/c home and start back on home meds Lasix 40mg  PO BID and Aldactone 25mg  PO daily  Acute on chronic low back pain - Lumbar XR 9/17 shows degenerative disc disease L3-4 and L4-5; no compression fracture or spondylolisthesis - had been prescribed Flexeril and states she has oxycodone for pain PRN - Gave 0.5 morphine in ED and Flexeril and oxycodone PRN while admitted  - Continue home pain regimen   Bilateral LE edema, cellulitis of lower extremities present on admission - Patient was on day 10 of Keflex for cellulitis per PCP - No leukocytosis, pt has remained afebrile - Discontinue Keflex  DM type 2 - Has remained on home insulin regimen while at hospital and CBGs relatively stable - Continue home insulin  H/o CVA, TIA - Continue Plavix     Scheduled Meds: . Chlorhexidine Gluconate Cloth  6 each Topical Q0600  . clopidogrel  75 mg Oral Daily  . enoxaparin (LOVENOX) injection  40 mg Subcutaneous Q24H  . furosemide  60 mg Intravenous Q12H  . insulin aspart  0-9 Units Subcutaneous TID WC  . insulin aspart  20 Units Subcutaneous TID AC  . insulin glargine  80 Units Subcutaneous q morning - 10a  .  ipratropium-albuterol  3 mL Nebulization Once  . mupirocin ointment  1 application Nasal BID  . pramipexole  4 mg Oral Daily  . sodium chloride flush  3 mL Intravenous Q12H  . terbinafine  250 mg Oral Daily   Continuous Infusions: PRN Meds:.acetaminophen, cyclobenzaprine, ondansetron, oxyCODONE-acetaminophen, zolpidem  DVT prophylaxis: Lovenox Code Status: full code Family Communication: none at bedside Disposition  Plan: home   Consultants:   None  Procedures:   2D echo: 9/17  BiPAP: required 9/16  Antimicrobials:  Keflex- d/c'ed   Objective: Vitals:   06/18/18 1250 06/18/18 2109 06/19/18 0500 06/19/18 0510  BP:  (!) 122/53  (!) 110/55  Pulse:  63  (!) 57  Resp:  18  18  Temp:  98.8 F (37.1 C)  98.3 F (36.8 C)  TempSrc:  Oral  Oral  SpO2: 95% 97%  92%  Weight:   102.3 kg   Height:        Intake/Output Summary (Last 24 hours) at 06/19/2018 1200 Last data filed at 06/19/2018 1057 Gross per 24 hour  Intake 180 ml  Output 400 ml  Net -220 ml   Filed Weights   06/17/18 1115 06/19/18 0500  Weight: 97.5 kg 102.3 kg    Examination:  Constitutional: Comfortably lying in bed, NAD Respiratory: good air movement bilaterally, minimal crackles at bases, no wheezing, normal breathing effort  Cardiovascular: Regular rate and rhythm, no murmurs, mild LE edema Abdomen: soft, non tender Musculoskeletal: no clubbing or cyanosis Skin: bilateral mild erythema, chronic venous stasis changes Neurologic: alert and oriented x3, nonfocal Psychiatric: Normal mood and affect    Data Reviewed: I have independently reviewed following labs and imaging studies   CBC: Recent Labs  Lab 06/17/18 1306 06/18/18 0315  WBC 4.7 3.2*  NEUTROABS  --  1.4*  HGB 12.4 11.3*  HCT 38.8 35.4*  MCV 93.0 92.7  PLT 170 270*   Basic Metabolic Panel: Recent Labs  Lab 06/17/18 1306 06/18/18 0315 06/19/18 0522  NA 142 137 139  K 4.1 4.1 3.8  CL 105 102 104  CO2 30 28 27   GLUCOSE 168* 228* 122*  BUN 17 24* 24*  CREATININE 0.78 0.87 0.76  CALCIUM 8.6* 8.0* 8.2*   GFR: Estimated Creatinine Clearance: 66.7 mL/min (by C-G formula based on SCr of 0.76 mg/dL). Liver Function Tests: Recent Labs  Lab 06/17/18 1306 06/18/18 0315  AST 24 21  ALT 18 17  ALKPHOS 73 60  BILITOT 0.3 0.5  PROT 6.8 6.0*  ALBUMIN 3.5 3.1*   No results for input(s): LIPASE, AMYLASE in the last 168 hours. No results for  input(s): AMMONIA in the last 168 hours. Coagulation Profile: No results for input(s): INR, PROTIME in the last 168 hours. Cardiac Enzymes: Recent Labs  Lab 06/17/18 1306 06/18/18 0718 06/18/18 1327 06/18/18 1806  TROPONINI 0.03* 0.05* 0.04* 0.03*   BNP (last 3 results) Recent Labs    12/11/17 1112  PROBNP 159   HbA1C: No results for input(s): HGBA1C in the last 72 hours. CBG: Recent Labs  Lab 06/17/18 1347 06/18/18 1138 06/18/18 1637 06/18/18 2107 06/19/18 0825  GLUCAP 163* 78 90 163* 119*   Lipid Profile: No results for input(s): CHOL, HDL, LDLCALC, TRIG, CHOLHDL, LDLDIRECT in the last 72 hours. Thyroid Function Tests: No results for input(s): TSH, T4TOTAL, FREET4, T3FREE, THYROIDAB in the last 72 hours. Anemia Panel: No results for input(s): VITAMINB12, FOLATE, FERRITIN, TIBC, IRON, RETICCTPCT in the last 72 hours. Urine analysis:    Component  Value Date/Time   COLORURINE YELLOW 07/19/2017 1949   APPEARANCEUR CLEAR 07/19/2017 1949   LABSPEC 1.030 07/19/2017 1949   PHURINE 6.0 07/19/2017 1949   GLUCOSEU >=500 (A) 07/19/2017 1949   HGBUR NEGATIVE 07/19/2017 1949   HGBUR trace-lysed 09/12/2010 1538   BILIRUBINUR 2+  2 mg/dL 12/27/2017 1106   KETONESUR NEGATIVE 07/19/2017 1949   PROTEINUR +- 15 mg/dL 12/27/2017 1106   PROTEINUR NEGATIVE 07/19/2017 1949   UROBILINOGEN 4.0 (A) 12/27/2017 1106   UROBILINOGEN 0.2 11/10/2014 1225   NITRITE positive 12/27/2017 1106   NITRITE NEGATIVE 07/19/2017 1949   LEUKOCYTESUR Moderate (2+) (A) 12/27/2017 1106   Sepsis Labs: Invalid input(s): PROCALCITONIN, LACTICIDVEN  Recent Results (from the past 240 hour(s))  MRSA PCR Screening     Status: Abnormal   Collection Time: 06/17/18  6:22 PM  Result Value Ref Range Status   MRSA by PCR POSITIVE (A) NEGATIVE Final    Comment:        The GeneXpert MRSA Assay (FDA approved for NASAL specimens only), is one component of a comprehensive MRSA colonization surveillance program.  It is not intended to diagnose MRSA infection nor to guide or monitor treatment for MRSA infections. RESULT CALLED TO, READ BACK BY AND VERIFIED WITH: MCDONALD,C. RN @2056  ON 09.16.19 BY COHEN,K Performed at [facility], [place] [address], [place], [place] [address]       Radiology Studies: X-ray Chest Pa And Lateral  Result Date: 06/18/2018 CLINICAL DATA:  Chronic low back pain; follow-up CHF and generalized weakness. EXAM: CHEST - 2 VIEW COMPARISON:  PA and lateral chest x-ray of June 17, 2018 FINDINGS: The lungs remain hyperinflated. The interstitial markings remain mildly increased. The pulmonary vascularity is less engorged. The heart is top-normal in size. There is calcification in the wall of the aortic arch. There is small pleural effusions layering posteriorly. IMPRESSION: CHF superimposed upon COPD. Decreased pulmonary interstitial edema. Persistent small posterior layering pleural effusions. Thoracic aortic atherosclerosis. Electronically Signed   By: David  Martinique M.D.   On: 06/18/2018 09:33   Dg Chest 2 View  Result Date: 06/17/2018 CLINICAL DATA:  Lower extremity edema EXAM: CHEST - 2 VIEW COMPARISON:  10/18/2017 FINDINGS: Cardiac shadow is mildly enlarged but stable. Lungs are well aerated bilaterally. No focal infiltrate is seen. Central vascular congestion is noted. IMPRESSION: Central vascular congestion without pulmonary edema. Electronically Signed   By: Inez Catalina M.D.   On: 06/17/2018 12:28   Dg Lumbar Spine Complete W/bend  Result Date: 06/18/2018 CLINICAL DATA:  Chronic low back pain with no known injury EXAM: LUMBAR SPINE - COMPLETE WITH BENDING VIEWS COMPARISON:  Coronal and sagittal reconstructed images through the lumbar spine from an abdominal and pelvic CT scan of Feb 02, 2016 FINDINGS: The lumbar vertebral bodies are preserved in height. The pedicles and transverse processes are grossly normal. There is moderate disc space narrowing at  L4-5. There are anterior near bridging osteophytes at L2-3 and L3-4. There is no spondylolisthesis. The facet joints exhibit no acute abnormalities. IMPRESSION: Degenerative disc disease centered at L3-4 and L4-5. No compression fracture or spondylolisthesis. Electronically Signed   By: David  Martinique M.D.   On: 06/18/2018 09:36       Time spent: [place], PA-S [facility]  If 7PM-7AM, please contact night-coverage [email or web address] Password [email or web address] 06/19/2018, 12:00 PM   [email or web address] @

## 2018-06-19 NOTE — Discharge Summary (Signed)
Physician Discharge Summary  Melanie Mcdonald  BDZ:[id number]  DOB: 1932/12/20  DOA: 06/17/2018 PCP: Laurey Morale, MD  Admit date: 06/17/2018 Discharge date: 06/19/2018  Admitted From: Home  Disposition: Home   Recommendations for Outpatient Follow-up:  1. Follow up with PCP in 1-2 weeks 2. Please obtain BMP/CBC in one week to monitor hgb and renal function   Home Health:  RN/PT/Social worker   Discharge Condition: Stable  CODE STATUS: Full Code  Diet recommendation: Heart Healthy   Brief/Interim Summary: For full details see H&P/Progress note, but in brief, Melanie Mcdonald is a 84 year old female with history of hypertension, diastolic CHF, hyperlipidemia, prior TIA presented with bilateral leg pain and low back pain for the last 2 days.  She has been on Keflex for cellulitis on her legs and stated that her legs had been more swollen with redness.  Upon ED evaluation patient developed sudden onset of respiratory distress and became hypoxic, tachypneic with increasing work of breathing.  Upon physical exam she was found to have bilateral crackles with lower extremity edema.  Patient was given IV Lasix and Nitropaste, subsequently placed on BiPAP.  Patient was admitted with working diagnosis of acute on chronic diastolic heart failure.  Patient was diuresed, however unable to accurate count urine output as patient was going to the bathroom alone and not measuring UOP.  Patient has clinical improvement denies chest pain, shortness of breath and palpitation.  For him back pain x-ray was performed which showed degenerative disc disease L2 3-4 and L4-5 with no compression fracture or spondylolisthesis.  Given significant improvement patient was deemed stable for discharge and follow-up with PCP.  Subjective: Patient seen and examined, report her breathing is much better back to baseline.  She is off oxygen.  Denies chest pain, shortness of breath and palpitation.  Remains afebrile.  No acute events  overnight.  Discharge Diagnoses/Hospital Course:  Acute respiratory failure with hypoxia secondary to flash pulmonary edema/CHF exacerbation. Initially treated with BiPAP and IV Lasix, successfully transitioned to room air.  Not requiring any oxygen supplementation.  Treat underlying causes.   Acute on chronic diastolic heart failure Likely due to high salt intake.  Patient report eating too many pumpkin seeds recently.  Also patient not very compliant with her medication especially with Lasix and Aldactone.  Chest x-ray shows central vascular congestion, CHF superimposed COPD.  Patient was diuresed with IV Lasix 60 mg every 12 hour however patient was refusing night dose due to increased frequency in urination.  She continues to clinically improve, with very minimal crackles bibasilar.  Lower extremity edema has significantly improved.  Will transition patient to Lasix 40 mg twice daily and Aldactone 25 mg daily.  Resuming same home regimen as patient was not compliant with this, therefore medication likely are working however patient is not taking them.   Acute on chronic low back pain Lumbar x-ray shows degenerative disc disease L3-4 and L4-5 no compression fracture or spondylolisthesis.  Patient was started on Flexeril she uses oxycodone as needed home.  Pain seems to be neuropathic, in view of the patient is diabetic with no well-controlled blood sugars. Will add gabapentin 300 mg 3 times daily.  Cellulitis of lower extremities Patient afebrile, completed 10 days of Keflex.  Improved.  No leukocytosis.  Type 2 diabetes mellitus CBGs stable during hospital stay, continue home regimen Had hypoglycemic episode while inpatient.  Patient has no strict diet at home and her glucose run high.  While in hospital carbs were  controlled and same insulin regimen leading to hypoglycemia.  Patient asymptomatic.   All other chronic medical condition were stable during the hospitalization.  On the day of the  discharge the patient's vitals were stable, and no other acute medical condition were reported by patient. The patient was felt safe to be discharge to home.   Discharge Instructions  You were cared for by a hospitalist during your hospital stay. If you have any questions about your discharge medications or the care you received while you were in the hospital after you are discharged, you can call the unit and asked to speak with the hospitalist on call if the hospitalist that took care of you is not available. Once you are discharged, your primary care physician will handle any further medical issues. Please note that NO REFILLS for any discharge medications will be authorized once you are discharged, as it is imperative that you return to your primary care physician (or establish a relationship with a primary care physician if you do not have one) for your aftercare needs so that they can reassess your need for medications and monitor your lab values.  Discharge Instructions    (HEART FAILURE PATIENTS) Call MD:  Anytime you have any of the following symptoms: 1) 3 pound weight gain in 24 hours or 5 pounds in 1 week 2) shortness of breath, with or without a dry hacking cough 3) swelling in the hands, feet or stomach 4) if you have to sleep on extra pillows at night in order to breathe.   Complete by:  As directed    Call MD for:  difficulty breathing, headache or visual disturbances   Complete by:  As directed    Call MD for:  extreme fatigue   Complete by:  As directed    Call MD for:  hives   Complete by:  As directed    Call MD for:  persistant dizziness or light-headedness   Complete by:  As directed    Call MD for:  persistant nausea and vomiting   Complete by:  As directed    Call MD for:  redness, tenderness, or signs of infection (pain, swelling, redness, odor or green/yellow discharge around incision site)   Complete by:  As directed    Call MD for:  severe uncontrolled pain   Complete  by:  As directed    Call MD for:  temperature >100.4   Complete by:  As directed    Diet - low sodium heart healthy   Complete by:  As directed    Increase activity slowly   Complete by:  As directed      Allergies as of 06/19/2018      Reactions   Lyrica [pregabalin] Other (See Comments)   Suicidal    Aspirin Other (See Comments)   Abdominal pain   Ciprofloxacin Nausea And Vomiting   Made pt very sick on stomach   Clarithromycin Other (See Comments)   Made stomach "burn"   Doxycycline Itching   Erythromycin Nausea And Vomiting   Lisinopril Cough   Macrobid [nitrofurantoin Monohyd Macro] Nausea And Vomiting   Metolazone Other (See Comments)   Pt stated this made her B/P drop   Other Nausea And Vomiting   Pt cannot have any of the -mycin(s)   Penicillins Swelling   From childhood: swelling @ injection site,childhood allergy Has patient had a PCN reaction causing immediate rash, facial/tongue/throat swelling, SOB or lightheadedness with hypotension: Yes Has patient had a PCN  reaction causing severe rash involving mucus membranes or skin necrosis: No Has patient had a PCN reaction that required hospitalization: No Has patient had a PCN reaction occurring within the last 10 years: No If all of the above answers are "NO", then may proceed with Cephalosporin use.   Sulfonamide Derivatives Nausea And Vomiting   Valtrex [valacyclovir Hcl] Nausea Only      Medication List    STOP taking these medications   cephALEXin 500 MG capsule Commonly known as:  KEFLEX     TAKE these medications   BEANO PO Take by mouth daily as needed.   clopidogrel 75 MG tablet Commonly known as:  PLAVIX Take 1 tablet (75 mg total) by mouth daily.   cyclobenzaprine 10 MG tablet Commonly known as:  FLEXERIL Take 10 mg by mouth as needed for muscle spasms.   furosemide 40 MG tablet Commonly known as:  LASIX Take 1 tablet (40 mg total) by mouth 2 (two) times daily with breakfast and lunch.    gabapentin 300 MG capsule Commonly known as:  NEURONTIN Take 1 capsule (300 mg total) by mouth 3 (three) times daily.   HUMALOG KWIKPEN 100 UNIT/ML KiwkPen Generic drug:  insulin lispro Inject 0.2 mLs (20 Units total) into the skin 3 (three) times daily before meals.   insulin degludec 100 UNIT/ML Sopn FlexTouch Pen Commonly known as:  TRESIBA Inject 0.8 mLs (80 Units total) into the skin every morning. What changed:  how much to take   meclizine 12.5 MG tablet Commonly known as:  ANTIVERT Take 1 tablet (12.5 mg total) by mouth 3 (three) times daily as needed for dizziness. What changed:  how much to take   NOVOFINE 32G X 6 MM Misc Generic drug:  Insulin Pen Needle by Does not apply route 3 (three) times daily.   ondansetron 8 MG tablet Commonly known as:  ZOFRAN Take 1 tablet (8 mg total) by mouth every 8 (eight) hours as needed for nausea or vomiting.   ONE TOUCH ULTRA TEST test strip Generic drug:  glucose blood 1 each by Other route as needed for other. Use as instructed   pramipexole 1 MG tablet Commonly known as:  MIRAPEX TAKE 6 TABLETS BY MOUTH DAILY What changed:  how much to take   spironolactone 25 MG tablet Commonly known as:  ALDACTONE TAKE 1 TABLET BY MOUTH EVERY DAY   terbinafine 250 MG tablet Commonly known as:  LAMISIL Take 1 tablet (250 mg total) by mouth daily.   zolpidem 10 MG tablet Commonly known as:  AMBIEN Take 1 tablet (10 mg total) by mouth at bedtime as needed for sleep.      Follow-up Information    Laurey Morale, MD. Schedule an appointment as soon as possible for a visit in 1 week(s).   Specialty:  Family Medicine Why:  Hospital follow-up Contact information: [address] [place] [address] [phone]          Allergies  Allergen Reactions  . Lyrica [Pregabalin] Other (See Comments)    Suicidal   . Aspirin Other (See Comments)    Abdominal pain  . Ciprofloxacin Nausea And Vomiting    Made pt very sick  on stomach  . Clarithromycin Other (See Comments)    Made stomach "burn"  . Doxycycline Itching  . Erythromycin Nausea And Vomiting  . Lisinopril Cough  . Macrobid [Nitrofurantoin Monohyd Macro] Nausea And Vomiting  . Metolazone Other (See Comments)    Pt stated this made  her B/P drop  . Other Nausea And Vomiting    Pt cannot have any of the -mycin(s)  . Penicillins Swelling    From childhood: swelling @ injection site,childhood allergy Has patient had a PCN reaction causing immediate rash, facial/tongue/throat swelling, SOB or lightheadedness with hypotension: Yes Has patient had a PCN reaction causing severe rash involving mucus membranes or skin necrosis: No Has patient had a PCN reaction that required hospitalization: No Has patient had a PCN reaction occurring within the last 10 years: No If all of the above answers are "NO", then may proceed with Cephalosporin use.  . Sulfonamide Derivatives Nausea And Vomiting  . Valtrex [Valacyclovir Hcl] Nausea Only    Consultations:  Procedures/Studies: X-ray Chest Pa And Lateral  Result Date: 06/18/2018 CLINICAL DATA:  Chronic low back pain; follow-up CHF and generalized weakness. EXAM: CHEST - 2 VIEW COMPARISON:  PA and lateral chest x-ray of June 17, 2018 FINDINGS: The lungs remain hyperinflated. The interstitial markings remain mildly increased. The pulmonary vascularity is less engorged. The heart is top-normal in size. There is calcification in the wall of the aortic arch. There is small pleural effusions layering posteriorly. IMPRESSION: CHF superimposed upon COPD. Decreased pulmonary interstitial edema. Persistent small posterior layering pleural effusions. Thoracic aortic atherosclerosis. Electronically Signed   By: David  Martinique M.D.   On: 06/18/2018 09:33   Dg Chest 2 View  Result Date: 06/17/2018 CLINICAL DATA:  Lower extremity edema EXAM: CHEST - 2 VIEW COMPARISON:  10/18/2017 FINDINGS: Cardiac shadow is mildly enlarged but  stable. Lungs are well aerated bilaterally. No focal infiltrate is seen. Central vascular congestion is noted. IMPRESSION: Central vascular congestion without pulmonary edema. Electronically Signed   By: Inez Catalina M.D.   On: 06/17/2018 12:28   Dg Lumbar Spine Complete W/bend  Result Date: 06/18/2018 CLINICAL DATA:  Chronic low back pain with no known injury EXAM: LUMBAR SPINE - COMPLETE WITH BENDING VIEWS COMPARISON:  Coronal and sagittal reconstructed images through the lumbar spine from an abdominal and pelvic CT scan of Feb 02, 2016 FINDINGS: The lumbar vertebral bodies are preserved in height. The pedicles and transverse processes are grossly normal. There is moderate disc space narrowing at L4-5. There are anterior near bridging osteophytes at L2-3 and L3-4. There is no spondylolisthesis. The facet joints exhibit no acute abnormalities. IMPRESSION: Degenerative disc disease centered at L3-4 and L4-5. No compression fracture or spondylolisthesis. Electronically Signed   By: David  Martinique M.D.   On: 06/18/2018 09:36   ECHO 9/17  ------------------------------------------------------------------- Study Conclusions  - Left ventricle: The cavity size was normal. There was moderate   concentric hypertrophy. Systolic function was normal. The   estimated ejection fraction was in the range of 60% to 65%. Wall   motion was normal; there were no regional wall motion   abnormalities. Features are consistent with a pseudonormal left   ventricular filling pattern, with concomitant abnormal relaxation   and increased filling pressure (grade 2 diastolic dysfunction).   Doppler parameters are consistent with high ventricular filling   pressure. - Aortic valve: Valve mobility was restricted. There was mild   stenosis. There was no regurgitation. - Mitral valve: Calcified annulus. The findings are consistent with   severe stenosis. There was mild regurgitation. Valve area by   pressure half-time: 1.51  cm^2. - Left atrium: The atrium was severely dilated. - Right ventricle: The cavity size was normal. Wall thickness was   normal. Systolic function was normal. - Tricuspid valve: There was trivial  regurgitation. - Pulmonary arteries: Systolic pressure was within the normal   range. PA peak pressure: 24 mm Hg (S).  Impressions:  - Compared with the echo 07/2017, the mean mitral valve gradient   has increased from 7 mmHg to 12 mmHg.  Discharge Exam: Vitals:   06/19/18 0510 06/19/18 1249  BP: (!) 110/55 (!) 155/61  Pulse: (!) 57 65  Resp: 18 18  Temp: 98.3 F (36.8 C) 98.2 F (36.8 C)  SpO2: 92% 95%   Vitals:   06/18/18 2109 06/19/18 0500 06/19/18 0510 06/19/18 1249  BP: (!) 122/53  (!) 110/55 (!) 155/61  Pulse: 63  (!) 57 65  Resp: 18  18 18   Temp: 98.8 F (37.1 C)  98.3 F (36.8 C) 98.2 F (36.8 C)  TempSrc: Oral  Oral Oral  SpO2: 97%  92% 95%  Weight:  102.3 kg    Height:        General: Pt is alert, awake, not in acute distress Cardiovascular: RRR, S1/S2 +, no rubs, no gallops Respiratory: Good air entry, mild bibasilar crackles Abdominal: Soft Extremities: Trace LE edema/ b/l. Mild erythema, chronic venous changes     The results of significant diagnostics from this hospitalization (including imaging, microbiology, ancillary and laboratory) are listed below for reference.     Microbiology: Recent Results (from the past 240 hour(s))  MRSA PCR Screening     Status: Abnormal   Collection Time: 06/17/18  6:22 PM  Result Value Ref Range Status   MRSA by PCR POSITIVE (A) NEGATIVE Final    Comment:        The GeneXpert MRSA Assay (FDA approved for NASAL specimens only), is one component of a comprehensive MRSA colonization surveillance program. It is not intended to diagnose MRSA infection nor to guide or monitor treatment for MRSA infections. RESULT CALLED TO, READ BACK BY AND VERIFIED WITH: MCDONALD,C. RN @2056  ON 09.16.19 BY COHEN,K Performed at  [facility], [place] [address], [place], [place] [address]      Labs: BNP (last 3 results) Recent Labs    10/18/17 1609 06/17/18 1306  BNP 174.9* 401.0*   Basic Metabolic Panel: Recent Labs  Lab 06/17/18 1306 06/18/18 0315 06/19/18 0522  NA 142 137 139  K 4.1 4.1 3.8  CL 105 102 104  CO2 30 28 27   GLUCOSE 168* 228* 122*  BUN 17 24* 24*  CREATININE 0.78 0.87 0.76  CALCIUM 8.6* 8.0* 8.2*   Liver Function Tests: Recent Labs  Lab 06/17/18 1306 06/18/18 0315  AST 24 21  ALT 18 17  ALKPHOS 73 60  BILITOT 0.3 0.5  PROT 6.8 6.0*  ALBUMIN 3.5 3.1*   No results for input(s): LIPASE, AMYLASE in the last 168 hours. No results for input(s): AMMONIA in the last 168 hours. CBC: Recent Labs  Lab 06/17/18 1306 06/18/18 0315  WBC 4.7 3.2*  NEUTROABS  --  1.4*  HGB 12.4 11.3*  HCT 38.8 35.4*  MCV 93.0 92.7  PLT 170 148*   Cardiac Enzymes: Recent Labs  Lab 06/17/18 1306 06/18/18 0718 06/18/18 1327 06/18/18 1806  TROPONINI 0.03* 0.05* 0.04* 0.03*   BNP: Invalid input(s): POCBNP CBG: Recent Labs  Lab 06/18/18 2107 06/19/18 0825 06/19/18 1358 06/19/18 1416 06/19/18 1438  GLUCAP 163* 119* 40* 62* 61*   D-Dimer No results for input(s): DDIMER in the last 72 hours. Hgb A1c No results for input(s): HGBA1C in the last 72 hours. Lipid Profile No results for input(s): CHOL, HDL, LDLCALC,  TRIG, CHOLHDL, LDLDIRECT in the last 72 hours. Thyroid function studies No results for input(s): TSH, T4TOTAL, T3FREE, THYROIDAB in the last 72 hours.  Invalid input(s): FREET3 Anemia work up No results for input(s): VITAMINB12, FOLATE, FERRITIN, TIBC, IRON, RETICCTPCT in the last 72 hours. Urinalysis    Component Value Date/Time   COLORURINE YELLOW 07/19/2017 1949   APPEARANCEUR CLEAR 07/19/2017 1949   LABSPEC 1.030 07/19/2017 1949   PHURINE 6.0 07/19/2017 1949   GLUCOSEU >=500 (A) 07/19/2017 1949   HGBUR NEGATIVE 07/19/2017 1949   HGBUR trace-lysed  09/12/2010 1538   BILIRUBINUR 2+  2 mg/dL 12/27/2017 1106   KETONESUR NEGATIVE 07/19/2017 1949   PROTEINUR +- 15 mg/dL 12/27/2017 1106   PROTEINUR NEGATIVE 07/19/2017 1949   UROBILINOGEN 4.0 (A) 12/27/2017 1106   UROBILINOGEN 0.2 11/10/2014 1225   NITRITE positive 12/27/2017 1106   NITRITE NEGATIVE 07/19/2017 1949   LEUKOCYTESUR Moderate (2+) (A) 12/27/2017 1106   Sepsis Labs Invalid input(s): PROCALCITONIN,  WBC,  LACTICIDVEN Microbiology Recent Results (from the past 240 hour(s))  MRSA PCR Screening     Status: Abnormal   Collection Time: 06/17/18  6:22 PM  Result Value Ref Range Status   MRSA by PCR POSITIVE (A) NEGATIVE Final    Comment:        The GeneXpert MRSA Assay (FDA approved for NASAL specimens only), is one component of a comprehensive MRSA colonization surveillance program. It is not intended to diagnose MRSA infection nor to guide or monitor treatment for MRSA infections. RESULT CALLED TO, READ BACK BY AND VERIFIED WITH: MCDONALD,C. RN @2056  ON 09.16.19 BY COHEN,K Performed at [facility], [place] [address], [place], [place] [address]     Time coordinating discharge: 32 minutes  SIGNED:  Chipper Oman, MD  [facility] 06/19/2018, 2:46 PM  Pager please text page via  [email or web address]  Note - This record has been created using [employer]. Chart creation errors have been sought, but may not always have been located. Such creation errors do not reflect on the standard of medical care.

## 2018-06-19 NOTE — Progress Notes (Signed)
MD paged and made aware of hypoglycemic episodes. Pt A&O, sitting in chair after eating lunch. CBG improved now. Will continue with D/C.

## 2018-06-20 ENCOUNTER — Telehealth: Payer: Self-pay | Admitting: Family Medicine

## 2018-06-20 ENCOUNTER — Other Ambulatory Visit: Payer: Self-pay | Admitting: Family Medicine

## 2018-06-20 NOTE — Telephone Encounter (Signed)
Pt states that she needs test strips for her glucose meter.  Please contact pt with any questions.

## 2018-06-20 NOTE — Telephone Encounter (Signed)
Transition Care Management Follow-up Telephone Call   Date discharged? 06/19/18   How have you been since you were released from the hospital? "Very very tired."   Do you understand why you were in the hospital? yes   Do you understand the discharge instructions? Yes - there was a lot to go over - she explained that she was too tired to listen to every thing.   Where were you discharged to? Home   Items Reviewed:  Medications reviewed: no  Allergies reviewed: yes  Dietary changes reviewed: yes - hasn't paid too much attention to the heart healthy diet and what it consists of.  Referrals reviewed: no   Functional Questionnaire:  Activities of Daily Living (ADLs):   She states they are independent in the following: n/a States they require assistance with the following: She has assistance with every daily activity right now.  Any transportation issues/concerns?: no - has assistance - hasn't driven in over a year   Any patient concerns? Yes - is this going to happen again?   Confirmed importance and date/time of follow-up visits scheduled yes  Provider Appointment booked with Dr Sarajane Jews  Confirmed with patient if condition begins to worsen call PCP or go to the ER.  Patient was given the office number and encouraged to call back with question or concerns.  : yes

## 2018-06-21 ENCOUNTER — Ambulatory Visit: Payer: Medicare Other | Admitting: Family Medicine

## 2018-06-21 ENCOUNTER — Encounter: Payer: Self-pay | Admitting: Family Medicine

## 2018-06-21 VITALS — BP 128/72 | HR 72 | Temp 97.9°F | Wt 221.0 lb

## 2018-06-21 DIAGNOSIS — I5033 Acute on chronic diastolic (congestive) heart failure: Secondary | ICD-10-CM | POA: Diagnosis not present

## 2018-06-21 DIAGNOSIS — I10 Essential (primary) hypertension: Secondary | ICD-10-CM

## 2018-06-21 DIAGNOSIS — R531 Weakness: Secondary | ICD-10-CM | POA: Diagnosis not present

## 2018-06-21 DIAGNOSIS — Z23 Encounter for immunization: Secondary | ICD-10-CM

## 2018-06-21 DIAGNOSIS — M7989 Other specified soft tissue disorders: Secondary | ICD-10-CM

## 2018-06-21 DIAGNOSIS — R197 Diarrhea, unspecified: Secondary | ICD-10-CM | POA: Diagnosis not present

## 2018-06-21 MED ORDER — GLUCOSE BLOOD VI STRP: 1.0000 | ORAL_STRIP | 1 refills | Status: DC | PRN

## 2018-06-21 MED ORDER — INSULIN PEN NEEDLE 32G X 6 MM MISC: 11 refills | Status: DC

## 2018-06-21 NOTE — Telephone Encounter (Signed)
Called and spoke with pt and she stated that she would like to have a refill of the test strips so she can test her BS from time to time.  She has a pending appt today.  Nothing further is needed.

## 2018-06-21 NOTE — Addendum Note (Signed)
Addended by: Rene Kocher on: 06/21/2018 11:30 AM   Modules accepted: Orders

## 2018-06-21 NOTE — Addendum Note (Signed)
Addended by: Elie Confer on: 06/21/2018 08:24 AM   Modules accepted: Orders

## 2018-06-21 NOTE — Progress Notes (Signed)
   Subjective:    Patient ID: Melanie Mcdonald, female    DOB: [birth year], 85 y.o.   MRN: [id number]  HPI Here to follow up a hospital stay from 06-17-18 to 06-20-18 for an acute episode of CHF. She presented with leg swelling and SOB. CXR confirmed vascular congestion. She was diuresed with Lasix and was sent home on Lasix 40 mg bid and Aldactone 25 mg daily. Since coming home she has felt weak and complains of watery diarrhea. No cramps or nausea or fever. She is using Imodium prn. Drinking fluids.   Review of Systems  Constitutional: Positive for fatigue. Negative for chills, diaphoresis and fever.  Respiratory: Negative.   Cardiovascular: Positive for leg swelling. Negative for chest pain and palpitations.  Gastrointestinal: Positive for diarrhea. Negative for abdominal distention, abdominal pain, nausea and vomiting.       Objective:   Physical Exam  Constitutional: She is oriented to person, place, and time.  Walks with a walker   Neck: No thyromegaly present.  Cardiovascular: Normal rate, regular rhythm, normal heart sounds and intact distal pulses.  Pulmonary/Chest: Effort normal and breath sounds normal. No stridor. No respiratory distress. She has no wheezes. She has no rales.  Abdominal: Soft. Bowel sounds are normal. She exhibits no distension and no mass. There is no tenderness. There is no rebound and no guarding.  Musculoskeletal:  2+ edema in both lower legs   Lymphadenopathy:    She has no cervical adenopathy.  Neurological: She is alert and oriented to person, place, and time.          Assessment & Plan:  She is doing well after an acute exacerbation of CHF. Her current medications seem to be managing her fluid level well. With the diarrhea she may have a C diff infection, so we will send her home with a stool kit to collect a sample.  Alysia Penna, MD

## 2018-06-24 ENCOUNTER — Telehealth: Payer: Self-pay | Admitting: Family Medicine

## 2018-06-24 NOTE — Telephone Encounter (Signed)
Dr. Fry please advise. Thanks  

## 2018-06-24 NOTE — Telephone Encounter (Signed)
Copied from [place] [id number]. Topic: Quick Communication - See Telephone Encounter >> Jun 24, 2018  2:30 PM Antonieta Iba C wrote: CRM for notification. See Telephone encounter for: 06/24/18.  [place] w/ [facility] called in to request VO for pt  Frequency: 1 time this week   2 time a week for 3 weeks   CB: [phone]

## 2018-06-25 LAB — CLOSTRIDIUM DIFFICILE TOXIN B, QUALITATIVE, REAL-TIME PCR: CDIFFPCR: NOT DETECTED

## 2018-06-25 NOTE — Telephone Encounter (Signed)
Called and gave the VO to [name].  Nothing further is needed.

## 2018-06-25 NOTE — Telephone Encounter (Signed)
Please okay these orders  ?

## 2018-06-28 LAB — CUP PACEART REMOTE DEVICE CHECK
Date Time Interrogation Session: 20190907021203
Implantable Pulse Generator Implant Date: 20181022

## 2018-07-02 ENCOUNTER — Other Ambulatory Visit: Payer: Self-pay | Admitting: Family Medicine

## 2018-07-02 ENCOUNTER — Telehealth: Payer: Self-pay | Admitting: Family Medicine

## 2018-07-02 ENCOUNTER — Telehealth: Payer: Self-pay | Admitting: *Deleted

## 2018-07-02 NOTE — Telephone Encounter (Signed)
[facility] Call Reason for Call Request to send message to Office Initial Comment Caller leaving message about patient Melanie Mcdonald. Caller asking Dr. Sarajane Jews to speak to patient about her medication. The patient is only taking some of them. Additional Comment Lattie Haw the RN from [employer] [phone] ext [phone]) calling about patient Melanie Mcdonald DOB 1933-09-23. Caller requests the doctor calls the patient to tell her which medications to take. Patient's phone number is [phone]

## 2018-07-02 NOTE — Telephone Encounter (Signed)
FYI for Dr. Fry  

## 2018-07-02 NOTE — Telephone Encounter (Signed)
Please okay these orders  ?

## 2018-07-02 NOTE — Telephone Encounter (Signed)
Dr. Sarajane Jews please advise about her medications.  Thanks

## 2018-07-02 NOTE — Telephone Encounter (Signed)
Noted  

## 2018-07-02 NOTE — Telephone Encounter (Signed)
Copied from [place] [phone]. Topic: Quick Communication - See Telephone Encounter >> Jul 02, 2018 11:27 AM Rutherford Nail, NT wrote: CRM for notification. See Telephone encounter for: 07/02/18. Charmayne Sheer, medical social worker with [facility] calling to request verbal orders. States that the patient does not have a care giver to assist in care due to her son hurting his back. Would like to go out today to set up resources for patient and address no compliance issues.  CB#: [phone]

## 2018-07-02 NOTE — Telephone Encounter (Signed)
Copied from [place] [phone]. Topic: General - Other >> Jul 02, 2018  3:29 PM Lennox Solders wrote: Reason for CRM: dianne warden NP  from [facility] is calling to let dr fry know the pt fail mini cog test today. Pt was only able to recall one of three words

## 2018-07-02 NOTE — Telephone Encounter (Signed)
Dr. Fry please advise. Thanks  

## 2018-07-02 NOTE — Telephone Encounter (Signed)
Please speak to the patient about her medications and insist that  she take them as directed

## 2018-07-03 NOTE — Telephone Encounter (Signed)
FYI Spoke with patient and reviewed medications.  She is confessed with her dosage of her "fluid pills".  She will  Call her cardiologist to confirm.  She will also bring her medications with her at the next office visit.

## 2018-07-03 NOTE — Telephone Encounter (Signed)
Left message on machine for Millie to return our call. CRM

## 2018-07-08 ENCOUNTER — Telehealth: Payer: Self-pay | Admitting: Family Medicine

## 2018-07-08 DIAGNOSIS — I110 Hypertensive heart disease with heart failure: Secondary | ICD-10-CM | POA: Diagnosis not present

## 2018-07-08 DIAGNOSIS — E1151 Type 2 diabetes mellitus with diabetic peripheral angiopathy without gangrene: Secondary | ICD-10-CM

## 2018-07-08 DIAGNOSIS — D649 Anemia, unspecified: Secondary | ICD-10-CM

## 2018-07-08 DIAGNOSIS — I2510 Atherosclerotic heart disease of native coronary artery without angina pectoris: Secondary | ICD-10-CM

## 2018-07-08 DIAGNOSIS — J449 Chronic obstructive pulmonary disease, unspecified: Secondary | ICD-10-CM

## 2018-07-08 DIAGNOSIS — Z8673 Personal history of transient ischemic attack (TIA), and cerebral infarction without residual deficits: Secondary | ICD-10-CM

## 2018-07-08 DIAGNOSIS — K589 Irritable bowel syndrome without diarrhea: Secondary | ICD-10-CM

## 2018-07-08 DIAGNOSIS — Z7902 Long term (current) use of antithrombotics/antiplatelets: Secondary | ICD-10-CM

## 2018-07-08 DIAGNOSIS — M5126 Other intervertebral disc displacement, lumbar region: Secondary | ICD-10-CM

## 2018-07-08 DIAGNOSIS — E1142 Type 2 diabetes mellitus with diabetic polyneuropathy: Secondary | ICD-10-CM

## 2018-07-08 DIAGNOSIS — F329 Major depressive disorder, single episode, unspecified: Secondary | ICD-10-CM

## 2018-07-08 DIAGNOSIS — Z794 Long term (current) use of insulin: Secondary | ICD-10-CM

## 2018-07-08 DIAGNOSIS — E669 Obesity, unspecified: Secondary | ICD-10-CM

## 2018-07-08 DIAGNOSIS — Z789 Other specified health status: Secondary | ICD-10-CM

## 2018-07-08 DIAGNOSIS — Z79891 Long term (current) use of opiate analgesic: Secondary | ICD-10-CM

## 2018-07-08 DIAGNOSIS — G8929 Other chronic pain: Secondary | ICD-10-CM

## 2018-07-08 DIAGNOSIS — I5033 Acute on chronic diastolic (congestive) heart failure: Secondary | ICD-10-CM

## 2018-07-08 DIAGNOSIS — R261 Paralytic gait: Secondary | ICD-10-CM

## 2018-07-08 NOTE — Telephone Encounter (Signed)
Called and lmom for Olivia Mackie to call back about this pt.

## 2018-07-08 NOTE — Telephone Encounter (Signed)
Copied from [place] [phone]. Topic: Inquiry >> Jul 08, 2018 11:52 AM Oliver Pila B wrote: Reason for CRM: [facility] called to inquire about the pt's Freestyle [employer] monitor; the pt is going to need another one and they are asking what channel will they have to go thru in order to get a new one for the pt; contact [phone] ask for [facility]

## 2018-07-08 NOTE — Telephone Encounter (Signed)
I have called and lmom for Millie to return my call.

## 2018-07-11 ENCOUNTER — Ambulatory Visit (INDEPENDENT_AMBULATORY_CARE_PROVIDER_SITE_OTHER): Payer: Medicare Other | Admitting: *Deleted

## 2018-07-11 ENCOUNTER — Telehealth: Payer: Self-pay | Admitting: Family Medicine

## 2018-07-11 DIAGNOSIS — I6340 Cerebral infarction due to embolism of unspecified cerebral artery: Secondary | ICD-10-CM | POA: Diagnosis not present

## 2018-07-11 NOTE — Telephone Encounter (Signed)
Dr. Fry please advise. Thanks  

## 2018-07-11 NOTE — Telephone Encounter (Signed)
They should call EMS to take her to the ED

## 2018-07-11 NOTE — Telephone Encounter (Signed)
Copied from [place] [id number]. Topic: Quick Communication - See Telephone Encounter >> Jul 05, 2018 12:55 PM Vernona Rieger wrote: CRM for notification. See Telephone encounter for: 07/05/18.  Lattie Haw, RN with [facility] called and is sending a fax over to Dr Barbie Banner assistant to get a current medication list faxed back to her for their records. The fax is [phone] >> Jul 11, 2018 10:01 AM Berneta Levins wrote: Lattie Haw, case manager, from [facility] calling.  She never received fax back regarding pt's medications. Please fax medication list to Lattie Haw at [phone] (direct)

## 2018-07-11 NOTE — Telephone Encounter (Signed)
Copied from [place] [phone]. Topic: Quick Communication - See Telephone Encounter >> Jul 11, 2018  8:36 AM Ivar Drape wrote: CRM for notification. See Telephone encounter for: 07/11/18. Amanda nurse w/[facility] [phone] wanted the provider to know that the patient has had a 5 pound weight gain in about 24 hours, and she also has deminishment throughout all lobes. Patient is also reporting that she had some bleeding in her stools yesterday.  It was mixed in with the stools.

## 2018-07-11 NOTE — Telephone Encounter (Signed)
The med list has been faxed to [place] at the fax number given.

## 2018-07-11 NOTE — Progress Notes (Signed)
[facility] Summary Report / Loop Recorder 

## 2018-07-11 NOTE — Telephone Encounter (Signed)
I have called and lmom for Melanie Mcdonald to call back to let her know about what Dr. Ginnie Smart for the pt.

## 2018-07-12 NOTE — Telephone Encounter (Signed)
Spoke to [place] and informed her the pt should go to the ED per Dr. Sarajane Jews.  Estill Bamberg states she will call the pt but has doubts that she will be compliant.  Estill Bamberg reports the pt is not compliant with her diet.  The pt is "eating and drinking whatever she wants."  Pt is saying that she does not have much time left.  Informed her that I will send this information to Dr. Sarajane Jews as Juluis Rainier.

## 2018-07-16 NOTE — Telephone Encounter (Signed)
Called and spoke with [facility] and she stated that she did get the VO for this pt.  Everything has been done.

## 2018-07-16 NOTE — Telephone Encounter (Signed)
Called and spoke with Melanie Mcdonald and she stated that they did get a new machine for the pt.  Nothing further is needed.

## 2018-07-17 ENCOUNTER — Ambulatory Visit: Payer: Self-pay | Admitting: *Deleted

## 2018-07-17 ENCOUNTER — Encounter: Payer: Self-pay | Admitting: Family Medicine

## 2018-07-17 NOTE — Telephone Encounter (Signed)
Pt called with bilateral leg swelling. Legs are red and the left one is bigger than the right one. Legs are warm to touch, pt denies fever. Home heath RN is there and states she has 3+ pitting edema.  She had taken an extra dose of Lasix and it has not helped her. She wanted to know if she should take another one. Advised talking with the provider before taking extra doses of medication. Pt voiced understanding. She denies shortness of breath, chest pain or swelling in any other extremities. Pt request only to see her provider. Appointment scheduled per protocol. Pt will call back for any other concerns or increase in symptoms.   Reason for Disposition . [1] MODERATE leg swelling (e.g., swelling extends up to knees) AND [2] new onset or worsening  Answer Assessment - Initial Assessment Questions 1. ONSET: "When did the swelling start?" (e.g., minutes, hours, days) months 2. LOCATION: "What part of the leg is swollen?"  "Are both legs swollen or just one leg?"     Both legs from knees to ankles, some feet swelling 3. SEVERITY: "How bad is the swelling?" (e.g., localized; mild, moderate, severe)  - Localized - small area of swelling localized to one leg  - MILD pedal edema - swelling limited to foot and ankle, pitting edema < 1/4 inch (6 mm) deep, rest and elevation eliminate most or all swelling  - MODERATE edema - swelling of lower leg to knee, pitting edema > 1/4 inch (6 mm) deep, rest and elevation only partially reduce swelling  - SEVERE edema - swelling extends above knee, facial or hand swelling present      moderate 4. REDNESS: "Does the swelling look red or infected?"     redness 5. PAIN: "Is the swelling painful to touch?" If so, ask: "How painful is it?"   (Scale 1-10; mild, moderate or severe)     Tender to touch, pressing 6. FEVER: "Do you have a fever?" If so, ask: "What is it, how was it measured, and when did it start?"      no 7. CAUSE: "What do you think is causing the leg  swelling?"     Extra fluid in legs 8. MEDICAL HISTORY: "Do you have a history of heart failure, kidney disease, liver failure, or cancer?"     Heart failure, skin cancer 9. RECURRENT SYMPTOM: "Have you had leg swelling before?" If so, ask: "When was the last time?" "What happened that time?"     Have been swollen before and put on antibiotic 10. OTHER SYMPTOMS: "Do you have any other symptoms?" (e.g., chest pain, difficulty breathing)      Some discomfort in chest yesterday, took NTG  Protocols used: LEG SWELLING AND EDEMA-A-AH

## 2018-07-17 NOTE — Telephone Encounter (Signed)
FYI

## 2018-07-18 ENCOUNTER — Ambulatory Visit: Payer: Medicare Other | Admitting: Family Medicine

## 2018-07-18 ENCOUNTER — Encounter: Payer: Self-pay | Admitting: Family Medicine

## 2018-07-18 VITALS — BP 140/70 | HR 80 | Temp 97.7°F | Wt 230.2 lb

## 2018-07-18 DIAGNOSIS — Z22322 Carrier or suspected carrier of Methicillin resistant Staphylococcus aureus: Secondary | ICD-10-CM | POA: Diagnosis not present

## 2018-07-18 DIAGNOSIS — M7989 Other specified soft tissue disorders: Secondary | ICD-10-CM

## 2018-07-18 NOTE — Progress Notes (Signed)
   Subjective:    Patient ID: Melanie Mcdonald, female    DOB: Nov 10, 1932, 84 y.o.   MRN: [id number]  HPI Here for several days of increased leg swelling and she has concerns about MRSA. She is taking Lasix 40 mg one in the early am and then a second one an hour later. This does not seem to work as well as it used to. No SOB or cough. Also she was told in the hospital recently that she had MRSA detected in her nose. She was given a tube of something to put in her nose but she has lost it.    Review of Systems  Constitutional: Negative.   HENT: Negative.   Eyes: Negative.   Respiratory: Negative.   Cardiovascular: Positive for leg swelling. Negative for chest pain and palpitations.       Objective:   Physical Exam  Constitutional: She appears well-developed and well-nourished.  HENT:  Nose: Nose normal.  Neck: No thyromegaly present.  Cardiovascular: Normal rate, regular rhythm, normal heart sounds and intact distal pulses.  Pulmonary/Chest: Effort normal and breath sounds normal. No stridor. No respiratory distress. She has no wheezes. She has no rales.  Musculoskeletal:  4+ edema in the lower legs   Lymphadenopathy:    She has no cervical adenopathy.          Assessment & Plan:  For the MRSA she will apply Mupiricin ointment to the nares. For the edema she will increase the Lsix to 3 tabs a day by taking 2 in the early am and then a third one an hour later. Recheck next week.  Alysia Penna, MD

## 2018-07-29 LAB — CUP PACEART REMOTE DEVICE CHECK
Date Time Interrogation Session: 20191010023530
MDC IDC PG IMPLANT DT: 20181022

## 2018-08-06 ENCOUNTER — Other Ambulatory Visit: Payer: Self-pay | Admitting: Family Medicine

## 2018-08-12 ENCOUNTER — Ambulatory Visit (INDEPENDENT_AMBULATORY_CARE_PROVIDER_SITE_OTHER): Payer: Medicare Other | Admitting: *Deleted

## 2018-08-12 DIAGNOSIS — I6340 Cerebral infarction due to embolism of unspecified cerebral artery: Secondary | ICD-10-CM

## 2018-08-13 NOTE — Progress Notes (Signed)
[facility] Summary Report / Loop Recorder 

## 2018-08-15 ENCOUNTER — Telehealth: Payer: Self-pay | Admitting: Family Medicine

## 2018-08-15 NOTE — Telephone Encounter (Signed)
Copied from [place] [id number]. Topic: Quick Communication - See Telephone Encounter >> Aug 15, 2018 10:50 AM Rutherford Nail, NT wrote: CRM for notification. See Telephone encounter for: 08/15/18. Lattie Haw RN with [facility] calling and states that she encouraged patient to bring in all medications in to follow up visit tomorrow 08/16/18. States that they were going over medications and she does not know if she is taking spirolactone. She is taking lasix (2 in the morning and 1 in the evening). States that the list that was sent to [facility] also has spirolactone 1x a day on there. Patient was unaware and will be looking to see if she has that medication at her home. States that a benefits person will call patient today to talk about the new glucose meter. States that the patient is having trouble getting the patches to go on her arm. States that she has been out of patches for 2 weeks. Would like someone to follow up with patient tomorrow at visit about patches to check sugar. States patient may need a specific prescription written. Please advise.   CB#: [phone] ext. [phone]

## 2018-08-16 ENCOUNTER — Encounter: Payer: Self-pay | Admitting: Family Medicine

## 2018-08-16 ENCOUNTER — Ambulatory Visit: Payer: Medicare Other | Admitting: Family Medicine

## 2018-08-16 VITALS — BP 120/60 | HR 94 | Temp 97.8°F | Ht 67.0 in

## 2018-08-16 DIAGNOSIS — R531 Weakness: Secondary | ICD-10-CM | POA: Diagnosis not present

## 2018-08-16 DIAGNOSIS — Z23 Encounter for immunization: Secondary | ICD-10-CM

## 2018-08-16 DIAGNOSIS — I10 Essential (primary) hypertension: Secondary | ICD-10-CM

## 2018-08-16 DIAGNOSIS — M7989 Other specified soft tissue disorders: Secondary | ICD-10-CM | POA: Diagnosis not present

## 2018-08-16 DIAGNOSIS — I5022 Chronic systolic (congestive) heart failure: Secondary | ICD-10-CM | POA: Diagnosis not present

## 2018-08-16 NOTE — Progress Notes (Signed)
   Subjective:    Patient ID: Melanie Mcdonald, female    DOB: 16-Oct-1932, 84 y.o.   MRN: [id number]  HPI Here to follow up on leg edema. She has been taking Lasix 80 mg in the am and 40 mg in the pm. Her legs are still quite swollen. Elevating her feet helps but she often forgets to do this. Compression stockings also help but she forgets to ask her son to help her put these on. She admits that she ran out of Spironolactone. No SOB.    Review of Systems  Constitutional: Negative.   Respiratory: Negative.   Cardiovascular: Positive for leg swelling. Negative for chest pain and palpitations.  Neurological: Negative.        Objective:   Physical Exam  Constitutional: She is oriented to person, place, and time. She appears well-developed and well-nourished.  Walks with her walker   Neck: No thyromegaly present.  Cardiovascular: Normal rate, regular rhythm, normal heart sounds and intact distal pulses.  Pulmonary/Chest: Effort normal and breath sounds normal. No stridor. No respiratory distress. She has no wheezes. She has no rales.  Musculoskeletal:  3+ edema in both lower legs.   Lymphadenopathy:    She has no cervical adenopathy.  Neurological: She is alert and oriented to person, place, and time.          Assessment & Plan:  Her CHF and HTN are stable, but the leg edema remains a problem. She will start back on Spironolactone. Recheck in one month.  Alysia Penna, MD

## 2018-08-19 NOTE — Telephone Encounter (Signed)
OV on 11/15 with Dr. Sarajane Jews

## 2018-08-20 ENCOUNTER — Ambulatory Visit: Payer: Medicare Other | Admitting: Cardiology

## 2018-09-13 ENCOUNTER — Encounter: Payer: Self-pay | Admitting: Cardiology

## 2018-09-13 ENCOUNTER — Ambulatory Visit: Payer: Medicare Other | Admitting: Cardiology

## 2018-09-13 VITALS — BP 104/60 | HR 78 | Ht 67.0 in | Wt 236.8 lb

## 2018-09-13 DIAGNOSIS — I5033 Acute on chronic diastolic (congestive) heart failure: Secondary | ICD-10-CM | POA: Diagnosis not present

## 2018-09-13 DIAGNOSIS — R079 Chest pain, unspecified: Secondary | ICD-10-CM

## 2018-09-13 LAB — CBC
Hematocrit: 36.3 % (ref 34.0–46.6)
Hemoglobin: 12.4 g/dL (ref 11.1–15.9)
MCH: 30.2 pg (ref 26.6–33.0)
MCHC: 34.2 g/dL (ref 31.5–35.7)
MCV: 89 fL (ref 79–97)
Platelets: 250 10*3/uL (ref 150–450)
RBC: 4.1 x10E6/uL (ref 3.77–5.28)
RDW: 13.2 % (ref 12.3–15.4)
WBC: 11 10*3/uL — ABNORMAL HIGH (ref 3.4–10.8)

## 2018-09-13 LAB — BASIC METABOLIC PANEL
BUN/Creatinine Ratio: 30 — ABNORMAL HIGH (ref 12–28)
BUN: 38 mg/dL — ABNORMAL HIGH (ref 8–27)
CO2: 26 mmol/L (ref 20–29)
Calcium: 9.3 mg/dL (ref 8.7–10.3)
Chloride: 98 mmol/L (ref 96–106)
Creatinine, Ser: 1.26 mg/dL — ABNORMAL HIGH (ref 0.57–1.00)
GFR calc Af Amer: 46 mL/min/{1.73_m2} — ABNORMAL LOW (ref >59)
GFR calc non Af Amer: 39 mL/min/{1.73_m2} — ABNORMAL LOW (ref >59)
Glucose: 194 mg/dL — ABNORMAL HIGH (ref 65–99)
Potassium: 4.3 mmol/L (ref 3.5–5.2)
Sodium: 140 mmol/L (ref 134–144)

## 2018-09-13 LAB — HEPATIC FUNCTION PANEL
ALT: 12 IU/L (ref 0–32)
AST: 16 IU/L (ref 0–40)
Albumin: 4.1 g/dL (ref 3.5–4.7)
Alkaline Phosphatase: 102 IU/L (ref 39–117)
Bilirubin Total: 0.4 mg/dL (ref 0.0–1.2)
Bilirubin, Direct: 0.13 mg/dL (ref 0.00–0.40)
Total Protein: 7.2 g/dL (ref 6.0–8.5)

## 2018-09-13 LAB — PRO B NATRIURETIC PEPTIDE: NT-Pro BNP: 183 pg/mL (ref 0–738)

## 2018-09-13 LAB — TSH: TSH: 1.56 u[IU]/mL (ref 0.450–4.500)

## 2018-09-13 NOTE — Patient Instructions (Signed)
Medication Instructions:  Your physician has recommended you make the following change in your medication:   STOP: losartan   If you need a refill on your cardiac medications before your next appointment, please call your pharmacy.   Lab work: TODAY: BMET, CBC, TSH, LFTS, BNP  If you have labs (blood work) drawn today and your tests are completely normal, you will receive your results only by: Marland Kitchen MyChart Message (if you have MyChart) OR . A paper copy in the mail If you have any lab test that is abnormal or we need to change your treatment, we will call you to review the results.  Testing/Procedures: Your physician has requested that you have a lexiscan myoview. For further information please visit [email or web address]. Please follow instruction sheet, as given.  Follow-Up: Your physician recommends that you schedule a follow-up appointment in: 3 months with Dr. Meda Coffee  Any Other Special Instructions Will Be Listed Below (If Applicable).

## 2018-09-13 NOTE — Progress Notes (Signed)
Cardiology Office Note:    Date:  09/16/2018   ID:  Melanie Mcdonald, DOB [birth year], MRN [id number]  PCP:  Melanie Morale, Mcdonald  Cardiologist:  Ena Dawley, Mcdonald   Referring Mcdonald: Melanie Morale, Mcdonald   History of Present Illness:    Melanie Mcdonald is a 84 y.o. female with a hx of hypertension, hyperlipidemia, obesity, narcolepsy, multiple TIAs, prior cardiac catheterization with no intervention and negative stress test in 2012. Patient states that she has been profoundly fatigued and short of breath. 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 Patient appears very tired and in fact falls asleep twice during conversation and appears significantly short of breath just walking a few steps. She hasn't had pulmonary function test or chest CT. She underwent echocardiographic in March of this year that showed preserved left ventricular ejection fraction, no comment about diastolic function, mild mitral stenosis and no comment about right-sided pressures. She states that she always feels dizzy and she has been worked up for bradycardia in the past during surgery day heart monitor but is not sure what was decided based on the results. There is somedate and that her heart rate went as low as 40s and there was consideration about placement of permanent pacemaker. The patient states that she since she last saw Dr. Gwenlyn Mcdonald she has had stable daily dizziness but no syncope. She doesn't want to follow with Dr. Gwenlyn Mcdonald. The patient states that she has a history of asthma but she's not using any inhalers.  11/03/2014 - the patient is coming after 6 months she feels the same significant fatigue and shortness of breath with minimal exertion. No syncope or palpitations. No chest pain. She admits that she feels like she's taking a lot of medicines and since she had  syncopal episode after being started multiple medicines she only takes medicines as needed. She complains of significant lower extremity edema.  11/05/17 - the patient continues to feel tired, she is experiencing LE pain while walking and at rest. She was able to stop using some of her meds including narcotics. She is able to walk short distances without chest pain. She has been recently started on lasix for LE edema. Denies orthopnea or PND.  09/13/2018 - 9 months follow up the patient complians of fatigue and DOE. She has been complaint with meds and has no side effects, she has occasional mild LE edema, orthopnea, PND.    Past Medical History:  Diagnosis Date  . Abdominal pain, unspecified site 03/09/2014  . Acute on chronic diastolic CHF (congestive heart failure), NYHA class 3 ([employer]) 11/03/2014   Acute on chronic diastolic heart failure  . Acute respiratory failure (Melanie Mcdonald) 06/17/2018  . ANEMIA 01/30/2008  . ARTHRITIS 12/11/2007  . BACK PAIN, CHRONIC 06/23/2008  . Backache 06/23/2008   Qualifier: Diagnosis of  By: Melanie Mcdonald, Melanie Mcdonald CYST, RIGHT 12/17/2007  . Cancer (Melanie Mcdonald)   . Candidiasis of genitalia in female 07/20/2017  . Cellulitis of left leg 08/14/2011  . Cellulitis of right foot 06/13/2016  . Cervicogenic headache 06/14/2015  . Chest pain 06/08/2011   Atypical chest pain  . Chest pain 07/20/2017  . CHF (congestive heart failure) ([place])   . CHF (congestive heart failure), NYHA class II, chronic, systolic ([place]) 04/08/6766  . Colon polyps  FRAGMENTS OF HYPERPLASTIC POLYP  . CONTACT DERMATITIS 03/10/2009  . COPD (chronic obstructive pulmonary disease) ([place])   . CORONARY ARTERY DISEASE 03/01/2007   had a normal Myoview stress test 07-13-11  . Coronary atherosclerosis 03/01/2007   Qualifier: Diagnosis of  By: Cori Razor RN, Mikal Plane   . CVA (cerebral vascular accident) (Melanie Mcdonald) 07/20/2017  . CYSTOCELE WITHOUT MENTION UTERINE PROLAPSE LAT 09/12/2010  . DEGENERATIVE JOINT DISEASE 08/07/2007    . DEPRESSION 03/01/2007  . Depression, recurrent (Melanie Mcdonald) 03/01/2007   Qualifier: Diagnosis of  By: Cori Razor RN, Mikal Plane   . DIABETES MELLITUS, TYPE II 08/26/2008  . Diabetic polyneuropathy associated with diabetes mellitus due to underlying condition (Melanie Mcdonald) 06/14/2015  . Diarrhea 08/15/2011  . Diverticulosis of colon (without mention of hemorrhage)   . Dizziness 02/10/2014  . DOE (dyspnea on exertion) 11/03/2014  . Edema 09/02/2008   Centricity Description: EDEMA Qualifier: Diagnosis of  By: Melanie Mcdonald, Lorinda Creed  Centricity Description: PERIPHERAL EDEMA Qualifier: Diagnosis of  By: Melanie Mcdonald, Lorinda Creed   . Esophageal reflux 11/06/2007  . Essential hypertension 03/01/2007   Qualifier: Diagnosis of  By: Cori Razor RN, Mikal Plane   . Gait instability 11/13/2014  . Gout 06/07/2016  . Headache 12/30/2013  . HIP PAIN 09/15/2010   Qualifier: Diagnosis of  By: Elease Hashimoto Mcdonald, Bruce    . HYPERKERATOSIS 06/02/2009  . HYPERLIPIDEMIA 08/26/2008  . Hyperlipidemia 08/26/2008   Qualifier: Diagnosis of  By: Cori Razor RN, Mikal Plane HYPERTENSION 03/01/2007  . Hypokalemia 08/15/2011  . Insomnia 12/11/2007  . Irritable bowel syndrome 01/26/2009  . LABYRINTHITIS 11/03/2009  . Labyrinthitis 11/03/2009   Qualifier: Diagnosis of  By: Melanie Mcdonald, Lorinda Creed   . Leg swelling 04/05/2016  . Memory loss 12/30/2013  . Narcolepsy without cataplexy 08/26/2008   Qualifier: Diagnosis of  By: Melanie Mcdonald, Melanie Mcdonald Narcolepsy without cataplexy(347.00) 08/26/2008  . NEPHROLITHIASIS 04/29/2009  . OBESITY 03/01/2007  . Obstructive apnea 11/03/2014  . Osteoarthritis 03/15/2018  . Pain in joint, ankle and foot 12/24/2015  . PERIPHERAL VASCULAR DISEASE 01/26/2009  . Peripheral vascular disease ([place]) 01/26/2009   Qualifier: Diagnosis of  By: Melanie Mcdonald, Melanie Mcdonald history of colonic polyps 03/09/2014  . Stroke ([place])   . SYNCOPE 08/26/2008   had brain MRI on 06-28-12 showing only chronic microvascular ischemia and atrophy   .  SYNDROME, RESTLESS LEGS 03/01/2007   Qualifier: Diagnosis of  By: Cori Razor RN, Mikal Plane Thyroid nodule 04/28/2014  . TIA (transient ischemic attack) 11/13/2014  . TRANSIENT ISCHEMIC ATTACK 10/20/2009   had normal brain MRA with patent vertebrals and carotids 06-28-12  . Uncontrolled type 2 diabetes mellitus with hyperglycemia, with long-term current use of insulin ([place]) 10/09/2016  . Weakness generalized 11/13/2014    Past Surgical History:  Procedure Laterality Date  . ABDOMINAL HYSTERECTOMY    . BACK SURGERY     x2  . CARDIAC CATHETERIZATION  07/2009  . CATARACT EXTRACTION    . COLONOSCOPY  11-13-07   per Dr. Deatra Ina, benign polyps, repeat in 5 yrs  . ESOPHAGOGASTRODUODENOSCOPY  11-13-07   per Dr. Deatra Ina, normal   . INTRAOCULAR LENS INSERTION    . LOOP RECORDER INSERTION N/A 07/23/2017   Procedure: LOOP RECORDER INSERTION;  Surgeon: Evans Lance, Mcdonald;  Location: [facility];  Service: Cardiovascular;  Laterality: N/A;  . SPINE SURGERY     x 3  . TONSILECTOMY, ADENOIDECTOMY,  BILATERAL MYRINGOTOMY AND TUBES      Current Medications: Current Meds  Medication Sig  . buPROPion (WELLBUTRIN XL) 150 MG 24 hr tablet Take 1 tablet by mouth daily.  . clopidogrel (PLAVIX) 75 MG tablet Take 1 tablet (75 mg total) by mouth daily.  . Continuous Blood Gluc Receiver (FREESTYLE LIBRE 14 DAY READER) DEVI USE AS DIRECTED  . furosemide (LASIX) 40 MG tablet Take 1 tablet (40 mg total) by mouth 2 (two) times daily with breakfast and lunch.  . gabapentin (NEURONTIN) 300 MG capsule Take 1 capsule (300 mg total) by mouth 3 (three) times daily.  Marland Kitchen glucose blood (ONE TOUCH ULTRA TEST) test strip 1 each by Other route as needed for other. Use as instructed  . HUMALOG KWIKPEN 100 UNIT/ML KiwkPen Inject 0.2 mLs (20 Units total) into the skin 3 (three) times daily before meals.  . insulin degludec (TRESIBA FLEXTOUCH) 100 UNIT/ML SOPN FlexTouch Pen Inject 0.8 mLs (80 Units total) into the skin every morning.    . Insulin Pen Needle (NOVOFINE) 32G X 6 MM MISC Check glucoses TID  . loperamide (IMODIUM) 2 MG capsule Take 4 mg by mouth daily as needed for diarrhea or loose stools.  . nitroGLYCERIN (NITROSTAT) 0.4 MG SL tablet Place 0.4 mg under the tongue every 5 (five) minutes as needed for chest pain.  Marland Kitchen oxyCODONE-acetaminophen (PERCOCET) 10-325 MG tablet Take 1 tablet by mouth every 6 (six) hours as needed for pain.  . pramipexole (MIRAPEX) 1 MG tablet TAKE 4 TABLETS(4 MG) BY MOUTH DAILY  . spironolactone (ALDACTONE) 25 MG tablet TAKE 1 TABLET BY MOUTH EVERY DAY  . terbinafine (LAMISIL) 250 MG tablet Take 1 tablet (250 mg total) by mouth daily.  . [DISCONTINUED] losartan (COZAAR) 50 MG tablet Take 1 tablet by mouth daily.     Allergies:   Lyrica [pregabalin]; Aspirin; Ciprofloxacin; Clarithromycin; Doxycycline; Erythromycin; Lisinopril; Macrobid [nitrofurantoin monohyd macro]; Metolazone; Other; Penicillins; Sulfonamide derivatives; and Valtrex [valacyclovir hcl]   Social History   Socioeconomic History  . Marital status: Widowed    Spouse name: Not on file  . Number of children: Not on file  . Years of education: Not on file  . Highest education level: Not on file  Occupational History  . Not on file  Social Needs  . Financial resource strain: Not on file  . Food insecurity:    Worry: Not on file    Inability: Not on file  . Transportation needs:    Medical: Not on file    Non-medical: Not on file  Tobacco Use  . Smoking status: Never Smoker  . Smokeless tobacco: Never Used  Substance and Sexual Activity  . Alcohol use: No    Alcohol/week: 0.0 standard drinks  . Drug use: No    Types: Oxycodone  . Sexual activity: Never    Birth control/protection: Surgical  Lifestyle  . Physical activity:    Days per week: Not on file    Minutes per session: Not on file  . Stress: Not on file  Relationships  . Social connections:    Talks on phone: Not on file    Gets together: Not on file     Attends religious service: Not on file    Active member of club or organization: Not on file    Attends meetings of clubs or organizations: Not on file    Relationship status: Not on file  Other Topics Concern  . Not on file  Social History Narrative  . Not  on file     Family History: The patient's family history includes COPD in her father; Lupus in her mother.  ROS:   Please see the history of present illness.    All other systems reviewed and are negative.  EKGs/Labs/Other Studies Reviewed:    The following studies were reviewed today:   EKG:  EKG is not ordered today.    Recent Labs: 06/17/2018: B Natriuretic Peptide 321.7 09/13/2018: ALT 12; BUN 38; Creatinine, Ser 1.26; Hemoglobin 12.4; NT-Pro BNP 183; Platelets 250; Potassium 4.3; Sodium 140; TSH 1.560  Recent Lipid Panel    Component Value Date/Time   CHOL 125 12/11/2017 1112   CHOL 160 12/21/2014 1153   TRIG 98 12/11/2017 1112   TRIG 62 12/21/2014 1153   HDL 49 12/11/2017 1112   HDL 74 12/21/2014 1153   CHOLHDL 2.6 12/11/2017 1112   CHOLHDL 2.7 07/20/2017 0450   VLDL 27 07/20/2017 0450   LDLCALC 56 12/11/2017 1112   LDLCALC 74 12/21/2014 1153    Physical Exam:    VS:  BP 104/60   Pulse 78   Ht 5\' 7"  (1.702 m)   Wt 236 lb 12.8 oz (107.4 kg)   SpO2 90%   BMI 37.09 kg/m     Wt Readings from Last 3 Encounters:  09/13/18 236 lb 12.8 oz (107.4 kg)  07/18/18 230 lb 4 oz (104.4 kg)  06/21/18 221 lb (100.2 kg)     GEN: Well nourished, well developed in no acute distress appears tired HEENT: Normal NECK: No JVD; No carotid bruits LYMPHATICS: No lymphadenopathy CARDIAC: RRR, no murmurs, rubs, gallops RESPIRATORY:  Clear to auscultation without rales, wheezing or rhonchi  ABDOMEN: Soft, non-tender, non-distended MUSCULOSKELETAL:  Chronic B/L  Edema up to the knees; pulses not felt on LE B/L  SKIN: Warm and dry NEUROLOGIC:  Alert and oriented x 3 PSYCHIATRIC:  Normal affect   ASSESSMENT:    1.  Acute on chronic diastolic heart failure (HCC)   2. Chest pain, unspecified type    PLAN:    In order of problems listed above:  1. Acute and chronic diastolic CHF - continue lasix 40 mg po daily and spironolactone 25 mg po daily, crea slightly up, normal BNP and TSH today, she appears euvolemic.  2. DOE- sec to uncontrolled HTN -  order Lexiscan nuclear stress test was negative  3. Hypertension - BP rather low, hold losartan  Follow up in 3 months.  Medication Adjustments/Labs and Tests Ordered: Current medicines are reviewed at length with the patient today.  Concerns regarding medicines are outlined above.  Orders Placed This Encounter  Procedures  . Basic metabolic panel  . CBC  . TSH  . Hepatic function panel  . Pro b natriuretic peptide (BNP)  . MYOCARDIAL PERFUSION IMAGING   No orders of the defined types were placed in this encounter.   Signed, Ena Dawley, Mcdonald  09/16/2018 12:29 AM    [place]

## 2018-09-16 ENCOUNTER — Ambulatory Visit (INDEPENDENT_AMBULATORY_CARE_PROVIDER_SITE_OTHER): Payer: Medicare Other

## 2018-09-16 DIAGNOSIS — I6340 Cerebral infarction due to embolism of unspecified cerebral artery: Secondary | ICD-10-CM

## 2018-09-16 NOTE — Progress Notes (Signed)
[facility] Summary Report / Loop Recorder 

## 2018-09-30 ENCOUNTER — Telehealth (HOSPITAL_COMMUNITY): Payer: Self-pay | Admitting: *Deleted

## 2018-09-30 NOTE — Telephone Encounter (Signed)
Left message on voicemail per DPR in reference to upcoming appointment scheduled on 09/03/18 with detailed instructions given per Myocardial Perfusion Study Information Sheet for the test. LM to arrive 15 minutes early, and that it is imperative to arrive on time for appointment to keep from having the test rescheduled. If you need to cancel or reschedule your appointment, please call the office within 24 hours of your appointment. Failure to do so may result in a cancellation of your appointment, and a $50 no show fee. Phone number given for call back for any questions. Kirstie Peri

## 2018-10-01 LAB — CUP PACEART REMOTE DEVICE CHECK
Implantable Pulse Generator Implant Date: 20181022
MDC IDC SESS DTM: 20191112024057

## 2018-10-03 ENCOUNTER — Telehealth (HOSPITAL_COMMUNITY): Payer: Self-pay

## 2018-10-03 NOTE — Telephone Encounter (Signed)
Patient given detailed instructions per Myocardial Perfusion Study Information Sheet for the test on 1/3 at 0945. Patient notified to arrive 15 minutes early and that it is imperative to arrive on time for appointment to keep from having the test rescheduled.  If you need to cancel or reschedule your appointment, please call the office within 24 hours of your appointment. . Patient verbalized understanding. TMY

## 2018-10-04 ENCOUNTER — Ambulatory Visit (HOSPITAL_COMMUNITY): Payer: Medicare Other | Attending: Cardiology

## 2018-10-04 DIAGNOSIS — R079 Chest pain, unspecified: Secondary | ICD-10-CM | POA: Diagnosis not present

## 2018-10-04 DIAGNOSIS — I5033 Acute on chronic diastolic (congestive) heart failure: Secondary | ICD-10-CM | POA: Insufficient documentation

## 2018-10-04 LAB — MYOCARDIAL PERFUSION IMAGING
LV dias vol: 92 mL (ref 46–106)
LV sys vol: 48 mL
Peak HR: 90 {beats}/min
Rest HR: 76 {beats}/min
SDS: 3
SRS: 0
SSS: 3
TID: 1.16

## 2018-10-04 MED ORDER — REGADENOSON 0.4 MG/5ML IV SOLN
0.4000 mg | Freq: Once | INTRAVENOUS | Status: AC
  Administered 2018-10-04: 0.4 mg via INTRAVENOUS

## 2018-10-04 MED ORDER — TECHNETIUM TC 99M TETROFOSMIN IV KIT
10.0000 | PACK | Freq: Once | INTRAVENOUS | Status: AC | PRN
  Administered 2018-10-04: 10 via INTRAVENOUS
  Filled 2018-10-04: qty 10

## 2018-10-04 MED ORDER — TECHNETIUM TC 99M TETROFOSMIN IV KIT
32.8000 | PACK | Freq: Once | INTRAVENOUS | Status: AC | PRN
  Administered 2018-10-04: 32.8 via INTRAVENOUS
  Filled 2018-10-04: qty 33

## 2018-10-09 ENCOUNTER — Encounter: Payer: Self-pay | Admitting: Family Medicine

## 2018-10-09 ENCOUNTER — Ambulatory Visit: Payer: Medicare Other | Admitting: Family Medicine

## 2018-10-09 ENCOUNTER — Encounter

## 2018-10-09 VITALS — BP 136/72 | HR 86 | Temp 97.4°F | Wt 231.6 lb

## 2018-10-09 DIAGNOSIS — R319 Hematuria, unspecified: Secondary | ICD-10-CM | POA: Diagnosis not present

## 2018-10-09 DIAGNOSIS — N390 Urinary tract infection, site not specified: Secondary | ICD-10-CM | POA: Diagnosis not present

## 2018-10-09 DIAGNOSIS — R8290 Unspecified abnormal findings in urine: Secondary | ICD-10-CM | POA: Diagnosis not present

## 2018-10-09 LAB — POCT URINALYSIS DIPSTICK
Bilirubin, UA: NEGATIVE
Blood, UA: POSITIVE
GLUCOSE UA: NEGATIVE
Ketones, UA: NEGATIVE
NITRITE UA: POSITIVE
PROTEIN UA: POSITIVE — AB
Spec Grav, UA: 1.025 (ref 1.010–1.025)
Urobilinogen, UA: 0.2 E.U./dL
pH, UA: 5.5 (ref 5.0–8.0)

## 2018-10-09 MED ORDER — PRAMIPEXOLE DIHYDROCHLORIDE 1 MG PO TABS: ORAL_TABLET | ORAL | 1 refills | Status: DC

## 2018-10-09 MED ORDER — OXYCODONE-ACETAMINOPHEN 10-325 MG PO TABS: 1.0000 | ORAL_TABLET | Freq: Four times a day (QID) | ORAL | 0 refills | Status: DC | PRN

## 2018-10-09 MED ORDER — CEPHALEXIN 500 MG PO CAPS: 500.0000 mg | ORAL_CAPSULE | Freq: Three times a day (TID) | ORAL | 0 refills | Status: AC

## 2018-10-09 NOTE — Progress Notes (Signed)
   Subjective:    Patient ID: Melanie Mcdonald, female    DOB: 1933/03/18, 85 y.o.   MRN: [id number]  HPI Here for 3 days of increased urgency to urinate with burning and some blood on the tissue. No fever or nausea.    Review of Systems  Constitutional: Negative.   Respiratory: Negative.   Cardiovascular: Negative.   Genitourinary: Positive for dysuria, frequency, hematuria and urgency.       Objective:   Physical Exam Constitutional:      Appearance: Normal appearance.  Cardiovascular:     Rate and Rhythm: Normal rate and regular rhythm.     Pulses: Normal pulses.     Heart sounds: Normal heart sounds.  Pulmonary:     Effort: Pulmonary effort is normal.     Breath sounds: Normal breath sounds.  Abdominal:     General: Abdomen is flat. Bowel sounds are normal. There is no distension.     Palpations: Abdomen is soft. There is no mass.     Tenderness: There is no abdominal tenderness. There is no guarding or rebound.     Hernia: No hernia is present.  Neurological:     Mental Status: She is alert.           Assessment & Plan:  UTI, treat with Keflex. Drink plenty of water. We will culture the sample.  Alysia Penna, MD

## 2018-10-11 ENCOUNTER — Ambulatory Visit: Payer: Medicare Other | Admitting: Family Medicine

## 2018-10-11 LAB — URINE CULTURE
MICRO NUMBER: 28210
SPECIMEN QUALITY:: ADEQUATE

## 2018-10-17 ENCOUNTER — Ambulatory Visit (INDEPENDENT_AMBULATORY_CARE_PROVIDER_SITE_OTHER): Payer: Medicare Other

## 2018-10-17 DIAGNOSIS — I6340 Cerebral infarction due to embolism of unspecified cerebral artery: Secondary | ICD-10-CM | POA: Diagnosis not present

## 2018-10-18 NOTE — Progress Notes (Signed)
[facility] Summary Report / Loop Recorder 

## 2018-10-19 LAB — CUP PACEART REMOTE DEVICE CHECK
Date Time Interrogation Session: 20200117023706
MDC IDC PG IMPLANT DT: 20181022

## 2018-10-20 LAB — CUP PACEART REMOTE DEVICE CHECK
Date Time Interrogation Session: 20191215023530
Implantable Pulse Generator Implant Date: 20181022

## 2018-10-28 ENCOUNTER — Other Ambulatory Visit: Payer: Self-pay | Admitting: Family Medicine

## 2018-11-01 ENCOUNTER — Ambulatory Visit (INDEPENDENT_AMBULATORY_CARE_PROVIDER_SITE_OTHER): Payer: Medicare Other

## 2018-11-01 ENCOUNTER — Ambulatory Visit: Payer: Medicare Other | Admitting: Family Medicine

## 2018-11-01 ENCOUNTER — Encounter: Payer: Self-pay | Admitting: Family Medicine

## 2018-11-01 VITALS — BP 128/70 | HR 85 | Temp 97.8°F | Wt 241.5 lb

## 2018-11-01 DIAGNOSIS — S6391XA Sprain of unspecified part of right wrist and hand, initial encounter: Secondary | ICD-10-CM

## 2018-11-01 DIAGNOSIS — S20212A Contusion of left front wall of thorax, initial encounter: Secondary | ICD-10-CM | POA: Diagnosis not present

## 2018-11-01 DIAGNOSIS — R300 Dysuria: Secondary | ICD-10-CM

## 2018-11-01 LAB — POC URINALSYSI DIPSTICK (AUTOMATED)
Bilirubin, UA: NEGATIVE
Blood, UA: NEGATIVE
Glucose, UA: NEGATIVE
Ketones, UA: NEGATIVE
Leukocytes, UA: NEGATIVE
Nitrite, UA: NEGATIVE
PH UA: 5 (ref 5.0–8.0)
Protein, UA: NEGATIVE
Spec Grav, UA: 1.015 (ref 1.010–1.025)
Urobilinogen, UA: 0.2 E.U./dL

## 2018-11-01 MED ORDER — HUMALOG KWIKPEN 100 UNIT/ML ~~LOC~~ SOPN: 20.0000 [IU] | PEN_INJECTOR | Freq: Three times a day (TID) | SUBCUTANEOUS | 5 refills | Status: DC

## 2018-11-01 MED ORDER — OXYCODONE-ACETAMINOPHEN 10-325 MG PO TABS: 1.0000 | ORAL_TABLET | Freq: Four times a day (QID) | ORAL | 0 refills | Status: DC | PRN

## 2018-11-01 MED ORDER — ZOLPIDEM TARTRATE 10 MG PO TABS: 10.0000 mg | ORAL_TABLET | Freq: Every evening | ORAL | 5 refills | Status: DC | PRN

## 2018-11-01 MED ORDER — INSULIN DEGLUDEC 100 UNIT/ML ~~LOC~~ SOPN: 80.0000 [IU] | PEN_INJECTOR | Freq: Every morning | SUBCUTANEOUS | 5 refills | Status: DC

## 2018-11-01 NOTE — Progress Notes (Signed)
   Subjective:    Patient ID: Melanie Mcdonald, female    DOB: 1933/09/03, 85 y.o.   MRN: [id number]  HPI Here to check injuries from a fall which occurred one week ago at home. While she was on the phone with someone she tried to hurry into another room to retrieve some papers and she lost her balance. She fell backwards against a abinet. There was no LOC. Since then she has had some mild posterior headaches and some neck pain. No nausea. She has chronic dizziness and she says this has not changed. She also describes pain in the right thumb and in the left side. No SOB. The right hand has pain when she tried to grip something, but there was no swelling or bruising. The left side has pain when she twists her body certain ways, but there is no swelling or bruising.   Review of Systems  Constitutional: Negative.   Respiratory: Negative.   Cardiovascular: Positive for chest pain. Negative for palpitations and leg swelling.  Musculoskeletal: Positive for arthralgias, neck pain and neck stiffness.  Neurological: Positive for dizziness and headaches.       Objective:   Physical Exam Constitutional:      Comments: Walks with her walker   Neck:     Musculoskeletal: Neck supple.     Comments: Mild tenderness in the posterior neck  Cardiovascular:     Rate and Rhythm: Normal rate and regular rhythm.     Pulses: Normal pulses.     Heart sounds: Normal heart sounds.  Pulmonary:     Effort: Pulmonary effort is normal.     Breath sounds: Normal breath sounds.     Comments: She is tender at the inferior margin of the left lateral ribs, no crepitus. Xrays reveal no rib fractures  Musculoskeletal:     Comments: She is tender in the right thumb including the MCM and CMC joints, no swelling or ecchymosis. Full ROM of the thumb and wrist. Xrays of the right hand are negative for fractures.   Neurological:     General: No focal deficit present.     Mental Status: She is alert and oriented to person,  place, and time.           Assessment & Plan:  She has bruised ribs and a sprained hand and thumb, and these should heal over the next few weeks. She can use Tylenol or Percocet as needed. Her urine is clear. I advised her to drink plenty of water.  Alysia Penna, MD

## 2018-11-04 NOTE — Telephone Encounter (Signed)
Dr. Fry please advise on refills. Thanks  

## 2018-11-12 ENCOUNTER — Telehealth: Payer: Self-pay

## 2018-11-12 NOTE — Telephone Encounter (Signed)
Copied from [place] [id number]. Topic: General - Other >> Nov 12, 2018 11:13 AM Yvette Rack wrote: Reason for CRM: Pt stated the home health nurse advised her to ask if she is a candidate for compression leg pump. Pt requests call back.

## 2018-11-13 NOTE — Telephone Encounter (Signed)
We can discuss this at her OV on Friday  

## 2018-11-13 NOTE — Telephone Encounter (Signed)
I called the pt and informed her of the message below

## 2018-11-15 ENCOUNTER — Ambulatory Visit: Payer: Medicare Other | Admitting: Family Medicine

## 2018-11-15 ENCOUNTER — Ambulatory Visit (INDEPENDENT_AMBULATORY_CARE_PROVIDER_SITE_OTHER): Payer: Medicare Other

## 2018-11-15 ENCOUNTER — Encounter: Payer: Self-pay | Admitting: Family Medicine

## 2018-11-15 VITALS — BP 134/72 | HR 82 | Temp 97.7°F | Wt 238.2 lb

## 2018-11-15 DIAGNOSIS — M542 Cervicalgia: Secondary | ICD-10-CM

## 2018-11-15 DIAGNOSIS — G4730 Sleep apnea, unspecified: Secondary | ICD-10-CM

## 2018-11-15 DIAGNOSIS — I2510 Atherosclerotic heart disease of native coronary artery without angina pectoris: Secondary | ICD-10-CM | POA: Diagnosis not present

## 2018-11-15 DIAGNOSIS — M7989 Other specified soft tissue disorders: Secondary | ICD-10-CM

## 2018-11-15 DIAGNOSIS — I5033 Acute on chronic diastolic (congestive) heart failure: Secondary | ICD-10-CM

## 2018-11-15 DIAGNOSIS — R609 Edema, unspecified: Secondary | ICD-10-CM

## 2018-11-15 DIAGNOSIS — I739 Peripheral vascular disease, unspecified: Secondary | ICD-10-CM | POA: Diagnosis not present

## 2018-11-15 DIAGNOSIS — I5022 Chronic systolic (congestive) heart failure: Secondary | ICD-10-CM

## 2018-11-15 DIAGNOSIS — I10 Essential (primary) hypertension: Secondary | ICD-10-CM

## 2018-11-15 DIAGNOSIS — M79674 Pain in right toe(s): Secondary | ICD-10-CM

## 2018-11-15 DIAGNOSIS — M65352 Trigger finger, left little finger: Secondary | ICD-10-CM

## 2018-11-15 MED ORDER — FUROSEMIDE 40 MG PO TABS: ORAL_TABLET | ORAL | 11 refills | Status: DC

## 2018-11-15 MED ORDER — SPIRONOLACTONE 25 MG PO TABS: 25.0000 mg | ORAL_TABLET | Freq: Two times a day (BID) | ORAL | 1 refills | Status: DC

## 2018-11-15 NOTE — Progress Notes (Signed)
   Subjective:    Patient ID: Melanie Mcdonald, female    DOB: Jan 25, 1933, 85 y.o.   MRN: [id number]  HPI Here for several issues. First she has had pain in the left pinky finger for several months, and it often gets stuck in a flexed position. She is able to straighten it out by moving it with the other hand. Second she has had pain when walking in the right great toe for a few months. No hx of trauma. Third her legs have been swelling more lately, though her weight today is actually down 3 lbs from her last visit. No SOB. Fourth she continues to have stiffness and pain in the neck since her fall last month. Fifth the nurse from [employer] has told her she could benefit from using leg compression pumps and she wants to try them.   Review of Systems  Constitutional: Negative.   Respiratory: Negative.   Cardiovascular: Positive for leg swelling. Negative for chest pain and palpitations.  Musculoskeletal: Positive for arthralgias, neck pain and neck stiffness.       Objective:   Physical Exam Constitutional:      Appearance: Normal appearance.  Cardiovascular:     Rate and Rhythm: Normal rate and regular rhythm.     Pulses: Normal pulses.     Heart sounds: Normal heart sounds.  Pulmonary:     Effort: Pulmonary effort is normal.     Breath sounds: Normal breath sounds. No rales.  Musculoskeletal:     Right lower leg: Edema present.     Left lower leg: Edema present.     Comments: Her neck has decreased ROM but is not tender. The right great toe has a large callus on the side. The left fifth finger will get caught in a flexed position but can be straightened wit a painful "pop"   Neurological:     Mental Status: She is alert.           Assessment & Plan:  She has a trigger finger, and we will refer her to Hand Surgery for this. She has a painful callus on the toe, so we will refer her to Podiatry. She has neck pain from a fall so we will get Xrays of the cervical spine. For  the leg edema we will order a pair of leg compression pumps and we will increase the Lasix to 80 mg in the am and 40 mg in the pm.  Alysia Penna, MD

## 2018-11-19 ENCOUNTER — Telehealth: Payer: Self-pay | Admitting: *Deleted

## 2018-11-19 ENCOUNTER — Ambulatory Visit (INDEPENDENT_AMBULATORY_CARE_PROVIDER_SITE_OTHER): Payer: Medicare Other

## 2018-11-19 DIAGNOSIS — I6340 Cerebral infarction due to embolism of unspecified cerebral artery: Secondary | ICD-10-CM

## 2018-11-19 NOTE — Telephone Encounter (Signed)
Copied from [place] [id number]. Topic: General - Inquiry >> Nov 18, 2018 12:00 PM Margot Ables wrote: Reason for CRM: Pt called stating she would like call back regarding xray results for her neck. She doesn't remember if she got the results and what they were.   Called and spoke with pt and she is aware of results.

## 2018-11-20 ENCOUNTER — Telehealth: Payer: Self-pay | Admitting: Family Medicine

## 2018-11-20 DIAGNOSIS — M65352 Trigger finger, left little finger: Secondary | ICD-10-CM | POA: Insufficient documentation

## 2018-11-20 LAB — CUP PACEART REMOTE DEVICE CHECK
Implantable Pulse Generator Implant Date: 20181022
MDC IDC SESS DTM: 20200219023542

## 2018-11-20 NOTE — Telephone Encounter (Signed)
Copied from [place] [id number]. Topic: Quick Communication - See Telephone Encounter >> Nov 20, 2018  4:05 PM Bea Graff, NT wrote: CRM for notification. See Telephone encounter for: 11/20/18. Blanchard Mane with [facility] calling to state she is faxing over a request for a referral to be completed for pt to begin home health again.

## 2018-11-21 ENCOUNTER — Emergency Department (HOSPITAL_COMMUNITY): Payer: Medicare Other

## 2018-11-21 ENCOUNTER — Telehealth: Payer: Self-pay | Admitting: Cardiology

## 2018-11-21 ENCOUNTER — Emergency Department (HOSPITAL_COMMUNITY)
Admission: EM | Admit: 2018-11-21 | Discharge: 2018-11-21 | Disposition: A | Discharge status: Home / Self Care | Payer: Medicare Other | Attending: Emergency Medicine | Admitting: Emergency Medicine

## 2018-11-21 ENCOUNTER — Encounter (HOSPITAL_COMMUNITY): Payer: Self-pay | Admitting: Radiology

## 2018-11-21 DIAGNOSIS — J449 Chronic obstructive pulmonary disease, unspecified: Secondary | ICD-10-CM | POA: Diagnosis not present

## 2018-11-21 DIAGNOSIS — E119 Type 2 diabetes mellitus without complications: Secondary | ICD-10-CM | POA: Diagnosis not present

## 2018-11-21 DIAGNOSIS — Z794 Long term (current) use of insulin: Secondary | ICD-10-CM | POA: Diagnosis not present

## 2018-11-21 DIAGNOSIS — I5022 Chronic systolic (congestive) heart failure: Secondary | ICD-10-CM | POA: Diagnosis not present

## 2018-11-21 DIAGNOSIS — I110 Hypertensive heart disease with heart failure: Secondary | ICD-10-CM | POA: Diagnosis not present

## 2018-11-21 DIAGNOSIS — Z79899 Other long term (current) drug therapy: Secondary | ICD-10-CM | POA: Insufficient documentation

## 2018-11-21 DIAGNOSIS — R42 Dizziness and giddiness: Secondary | ICD-10-CM | POA: Insufficient documentation

## 2018-11-21 LAB — PROTIME-INR
INR: 1.04
Prothrombin Time: 13.5 seconds (ref 11.4–15.2)

## 2018-11-21 LAB — ETHANOL: Alcohol, Ethyl (B): <10 mg/dL (ref <10)

## 2018-11-21 LAB — COMPREHENSIVE METABOLIC PANEL
ALT: 16 U/L (ref 0–44)
AST: 20 U/L (ref 15–41)
Albumin: 3.9 g/dL (ref 3.5–5.0)
Alkaline Phosphatase: 95 U/L (ref 38–126)
Anion gap: 10 (ref 5–15)
BUN: 32 mg/dL — ABNORMAL HIGH (ref 8–23)
CO2: 28 mmol/L (ref 22–32)
Calcium: 9.5 mg/dL (ref 8.9–10.3)
Chloride: 97 mmol/L — ABNORMAL LOW (ref 98–111)
Creatinine, Ser: 1.01 mg/dL — ABNORMAL HIGH (ref 0.44–1.00)
GFR calc Af Amer: 60 mL/min — ABNORMAL LOW (ref >60)
GFR calc non Af Amer: 51 mL/min — ABNORMAL LOW (ref >60)
Glucose, Bld: 312 mg/dL — ABNORMAL HIGH (ref 70–99)
Potassium: 4.2 mmol/L (ref 3.5–5.1)
Sodium: 135 mmol/L (ref 135–145)
Total Bilirubin: 0.6 mg/dL (ref 0.3–1.2)
Total Protein: 7.7 g/dL (ref 6.5–8.1)

## 2018-11-21 LAB — CBC
HCT: 40.8 % (ref 36.0–46.0)
Hemoglobin: 13.6 g/dL (ref 12.0–15.0)
MCH: 29.8 pg (ref 26.0–34.0)
MCHC: 33.3 g/dL (ref 30.0–36.0)
MCV: 89.5 fL (ref 80.0–100.0)
Platelets: 235 10*3/uL (ref 150–400)
RBC: 4.56 MIL/uL (ref 3.87–5.11)
RDW: 13.2 % (ref 11.5–15.5)
WBC: 11.8 10*3/uL — ABNORMAL HIGH (ref 4.0–10.5)
nRBC: 0 % (ref 0.0–0.2)

## 2018-11-21 LAB — DIFFERENTIAL
ABS IMMATURE GRANULOCYTES: 0.03 10*3/uL (ref 0.00–0.07)
Basophils Absolute: 0 10*3/uL (ref 0.0–0.1)
Basophils Relative: 0 %
EOS ABS: 0.1 10*3/uL (ref 0.0–0.5)
Eosinophils Relative: 1 %
Immature Granulocytes: 0 %
Lymphocytes Relative: 22 %
Lymphs Abs: 2.6 10*3/uL (ref 0.7–4.0)
Monocytes Absolute: 0.8 10*3/uL (ref 0.1–1.0)
Monocytes Relative: 7 %
Neutro Abs: 8.3 10*3/uL — ABNORMAL HIGH (ref 1.7–7.7)
Neutrophils Relative %: 70 %

## 2018-11-21 LAB — APTT: aPTT: 26 seconds (ref 24–36)

## 2018-11-21 MED ORDER — MECLIZINE HCL 25 MG PO TABS: 25.0000 mg | ORAL_TABLET | Freq: Three times a day (TID) | ORAL | 0 refills | Status: DC | PRN

## 2018-11-21 MED ORDER — OXYCODONE-ACETAMINOPHEN 5-325 MG PO TABS
2.0000 | ORAL_TABLET | Freq: Once | ORAL | Status: AC
  Administered 2018-11-21: 2 via ORAL
  Filled 2018-11-21: qty 2

## 2018-11-21 MED ORDER — ACETAMINOPHEN 325 MG PO TABS
650.0000 mg | ORAL_TABLET | Freq: Once | ORAL | Status: AC
  Administered 2018-11-21: 650 mg via ORAL
  Filled 2018-11-21: qty 2

## 2018-11-21 MED ORDER — IOPAMIDOL (ISOVUE-370) INJECTION 76%
INTRAVENOUS | Status: AC
  Administered 2018-11-21: 17:00:00
  Filled 2018-11-21: qty 100

## 2018-11-21 MED ORDER — SODIUM CHLORIDE 0.9% FLUSH
3.0000 mL | Freq: Once | INTRAVENOUS | Status: AC
  Administered 2018-11-21: 3 mL via INTRAVENOUS

## 2018-11-21 NOTE — Telephone Encounter (Signed)
Reviewed pt's symptoms with pt's social worker, Millie, and Dr Water quality scientist. Given pt's PMH and current sx, pt was advised to seek care at her closest ED for medical recommendation. The social worker has agreed with the plan and will drive pt to the ED.

## 2018-11-21 NOTE — Discharge Instructions (Addendum)
Contact a health care provider if: Your medicines do not relieve your vertigo or they make it worse. You have a fever. Your condition gets worse or you develop new symptoms. Your family or friends notice any behavioral changes. Your nausea or vomiting gets worse. You have numbness or a pins and needles sensation in part of your body. Get help right away if: You have difficulty moving or speaking. You are always dizzy. You faint. You develop severe headaches. You have weakness in your hands, arms, or legs. You have changes in your hearing or vision. You develop a stiff neck. You develop sensitivity to light.

## 2018-11-21 NOTE — ED Triage Notes (Signed)
Pt to ER for evaluation of dizziness onset this morning at 9:30 AM, states the room is spinning, reports no nausea or vomiting but does report a headache. Feels generally weak, no neuro deficits on exam. Reports a fall 3 weeks ago with resultant broken rib.

## 2018-11-21 NOTE — ED Provider Notes (Signed)
[facility] EMERGENCY DEPARTMENT Provider Note   CSN: [id number] Arrival date & time: 11/21/18  1333    History   Chief Complaint Chief Complaint  Patient presents with  . Dizziness    HPI Melanie Mcdonald is a 85 y.o. female who presents to the ED for Vertigo and headache.  She has a past medical history of CAD, PVD, diabetes, labyrinthitis.  Although the EMR lists labyrinthitis as a history patient denies any history of previous episodes of vertigo.  The patient had a fall about 3 weeks ago. She broke a rib on her R side. Since that time, she has had persistent neck pain on the right. The patient states that around 8:30 am she developed onset of headache, vertigo which she describes as room spinning, ataxia, and right visual field change.  She states that the symptoms have pretty much resolved except for her headache.  She states that the symptoms are also worsened whenever she tries to turn her neck to the right.  She denies unilateral weakness, difficulty with speech or swallowing.  She continues to have some "blurry vision in the right eye." She denies cp, racing or skipping in the heart.  Patient has a previous history of "9 mini strokes."  No chronic deficits.     HPI  Past Medical History:  Diagnosis Date  . Abdominal pain, unspecified site 03/09/2014  . Acute on chronic diastolic CHF (congestive heart failure), NYHA class 3 ([place]) 11/03/2014   Acute on chronic diastolic heart failure  . Acute respiratory failure ([place]) 06/17/2018  . ANEMIA 01/30/2008  . ARTHRITIS 12/11/2007  . BACK PAIN, CHRONIC 06/23/2008  . Backache 06/23/2008   Qualifier: Diagnosis of  By: Niel Hummer MD, [place] CYST, RIGHT 12/17/2007  . Cancer ([place])   . Candidiasis of genitalia in female 07/20/2017  . Cellulitis of left leg 08/14/2011  . Cellulitis of right foot 06/13/2016  . Cervicogenic headache 06/14/2015  . Chest pain 06/08/2011   Atypical chest pain  . Chest pain 07/20/2017  .  CHF (congestive heart failure) ([place])   . CHF (congestive heart failure), NYHA class II, chronic, systolic ([place]) 4/0/9811  . Colon polyps    FRAGMENTS OF HYPERPLASTIC POLYP  . CONTACT DERMATITIS 03/10/2009  . COPD (chronic obstructive pulmonary disease) ([place])   . CORONARY ARTERY DISEASE 03/01/2007   had a normal Myoview stress test 07-13-11  . Coronary atherosclerosis 03/01/2007   Qualifier: Diagnosis of  By: Cori Razor RN, Mikal Plane   . CVA (cerebral vascular accident) ([place]) 07/20/2017  . CYSTOCELE WITHOUT MENTION UTERINE PROLAPSE LAT 09/12/2010  . DEGENERATIVE JOINT DISEASE 08/07/2007  . DEPRESSION 03/01/2007  . Depression, recurrent ([place]) 03/01/2007   Qualifier: Diagnosis of  By: Cori Razor RN, Mikal Plane   . DIABETES MELLITUS, TYPE II 08/26/2008  . Diabetic polyneuropathy associated with diabetes mellitus due to underlying condition ([place]) 06/14/2015  . Diarrhea 08/15/2011  . Diverticulosis of colon (without mention of hemorrhage)   . Dizziness 02/10/2014  . DOE (dyspnea on exertion) 11/03/2014  . Edema 09/02/2008   Centricity Description: EDEMA Qualifier: Diagnosis of  By: Niel Hummer MD, Lorinda Creed  Centricity Description: PERIPHERAL EDEMA Qualifier: Diagnosis of  By: Niel Hummer MD, Lorinda Creed   . Esophageal reflux 11/06/2007  . Essential hypertension 03/01/2007   Qualifier: Diagnosis of  By: Cori Razor RN, Mikal Plane   . Gait instability 11/13/2014  . Gout 06/07/2016  . Headache 12/30/2013  . HIP PAIN 09/15/2010  Qualifier: Diagnosis of  By: Elease Hashimoto MD, [place]    . HYPERKERATOSIS 06/02/2009  . HYPERLIPIDEMIA 08/26/2008  . Hyperlipidemia 08/26/2008   Qualifier: Diagnosis of  By: Cori Razor RN, Mikal Plane HYPERTENSION 03/01/2007  . Hypokalemia 08/15/2011  . Insomnia 12/11/2007  . Irritable bowel syndrome 01/26/2009  . LABYRINTHITIS 11/03/2009  . Labyrinthitis 11/03/2009   Qualifier: Diagnosis of  By: Niel Hummer MD, Lorinda Creed   . Leg swelling 04/05/2016  . Memory loss 12/30/2013  . Narcolepsy without cataplexy 08/26/2008    Qualifier: Diagnosis of  By: Niel Hummer MD, [place] Narcolepsy without cataplexy(347.00) 08/26/2008  . NEPHROLITHIASIS 04/29/2009  . OBESITY 03/01/2007  . Obstructive apnea 11/03/2014  . Osteoarthritis 03/15/2018  . Pain in joint, ankle and foot 12/24/2015  . PERIPHERAL VASCULAR DISEASE 01/26/2009  . Peripheral vascular disease ([place]) 01/26/2009   Qualifier: Diagnosis of  By: Niel Hummer MD, [place] history of colonic polyps 03/09/2014  . Stroke ([place])   . SYNCOPE 08/26/2008   had brain MRI on 06-28-12 showing only chronic microvascular ischemia and atrophy   . SYNDROME, RESTLESS LEGS 03/01/2007   Qualifier: Diagnosis of  By: Cori Razor RN, Mikal Plane Thyroid nodule 04/28/2014  . TIA (transient ischemic attack) 11/13/2014  . TRANSIENT ISCHEMIC ATTACK 10/20/2009   had normal brain MRA with patent vertebrals and carotids 06-28-12  . Uncontrolled type 2 diabetes mellitus with hyperglycemia, with long-term current use of insulin ([place]) 10/09/2016  . Weakness generalized 11/13/2014    Patient Active Problem List   Diagnosis Date Noted  . Osteoarthritis 03/15/2018  . CHF (congestive heart failure), NYHA class II, chronic, systolic ([place]) 50/06/3817  . CVA (cerebral vascular accident) ([place]) 07/20/2017  . Candidiasis of genitalia in female 07/20/2017  . Uncontrolled type 2 diabetes mellitus with hyperglycemia, with long-term current use of insulin ([place]) 10/09/2016  . Gout 06/07/2016  . Leg swelling 04/05/2016  . Pain in joint, ankle and foot 12/24/2015  . Diabetic polyneuropathy associated with diabetes mellitus due to underlying condition ([place]) 06/14/2015  . Cervicogenic headache 06/14/2015  . Weakness generalized 11/13/2014  . TIA (transient ischemic attack) 11/13/2014  . Gait instability 11/13/2014  . Acute on chronic diastolic CHF (congestive heart failure), NYHA class 3 ([place]) 11/03/2014  . Obstructive apnea 11/03/2014  . DOE (dyspnea on exertion) 11/03/2014  . Thyroid nodule  04/28/2014  . Personal history of colonic polyps 03/09/2014  . Dizziness 02/10/2014  . Memory loss 12/30/2013  . Headache 12/30/2013  . Hypokalemia 08/15/2011  . Diarrhea 08/15/2011  . Chest pain 06/08/2011  . HIP PAIN 09/15/2010  . CYSTOCELE WITHOUT MENTION UTERINE PROLAPSE LAT 09/12/2010  . Labyrinthitis 11/03/2009  . NEPHROLITHIASIS 04/29/2009  . Peripheral vascular disease ([place]) 01/26/2009  . IRRITABLE BOWEL SYNDROME 01/26/2009  . Edema 09/02/2008  . Hyperlipidemia 08/26/2008  . Narcolepsy without cataplexy 08/26/2008  . Backache 06/23/2008  . Insomnia 12/11/2007  . Esophageal reflux 11/06/2007  . OBESITY 03/01/2007  . Depression, recurrent ([place]) 03/01/2007  . SYNDROME, RESTLESS LEGS 03/01/2007  . Essential hypertension 03/01/2007  . Coronary atherosclerosis 03/01/2007    Past Surgical History:  Procedure Laterality Date  . ABDOMINAL HYSTERECTOMY    . BACK SURGERY     x2  . CARDIAC CATHETERIZATION  07/2009  . CATARACT EXTRACTION    . COLONOSCOPY  11-13-07   per Dr. Deatra Ina, benign polyps, repeat in 5 yrs  . ESOPHAGOGASTRODUODENOSCOPY  11-13-07   per Dr. Deatra Ina, normal   .  INTRAOCULAR LENS INSERTION    . LOOP RECORDER INSERTION N/A 07/23/2017   Procedure: LOOP RECORDER INSERTION;  Surgeon: Evans Lance, MD;  Location: [facility];  Service: Cardiovascular;  Laterality: N/A;  . SPINE SURGERY     x 3  . TONSILECTOMY, ADENOIDECTOMY, BILATERAL MYRINGOTOMY AND TUBES       OB History   No obstetric history on file.      Home Medications    Prior to Admission medications   Medication Sig Start Date End Date Taking? Authorizing Provider  buPROPion (WELLBUTRIN XL) 150 MG 24 hr tablet Take 1 tablet by mouth daily.    [provider]  clopidogrel (PLAVIX) 75 MG tablet Take 1 tablet (75 mg total) by mouth daily. 05/06/18   Cameron Sprang, MD  Continuous Blood Gluc Receiver (FREESTYLE LIBRE 14 DAY READER) DEVI USE AS DIRECTED 09/05/18   Laurey Morale, MD   furosemide (LASIX) 40 MG tablet Take 2 tablets in the morning and 1 tablet in the evening 11/15/18   Laurey Morale, MD  gabapentin (NEURONTIN) 300 MG capsule Take 1 capsule (300 mg total) by mouth 3 (three) times daily. 06/19/18   Doreatha Lew, MD  glucose blood (ONE TOUCH ULTRA TEST) test strip 1 each by Other route as needed for other. Use as instructed 06/21/18   Laurey Morale, MD  HUMALOG KWIKPEN 100 UNIT/ML KwikPen Inject 0.2 mLs (20 Units total) into the skin 3 (three) times daily before meals. 11/01/18   Laurey Morale, MD  insulin degludec (TRESIBA FLEXTOUCH) 100 UNIT/ML SOPN FlexTouch Pen Inject 0.8 mLs (80 Units total) into the skin every morning. 11/01/18   Laurey Morale, MD  Insulin Pen Needle (NOVOFINE) 32G X 6 MM MISC Check glucoses TID 06/21/18   Laurey Morale, MD  loperamide (IMODIUM) 2 MG capsule Take 4 mg by mouth daily as needed for diarrhea or loose stools.    [provider]  nitroGLYCERIN (NITROSTAT) 0.4 MG SL tablet Place 0.4 mg under the tongue every 5 (five) minutes as needed for chest pain.    [provider]  oxyCODONE-acetaminophen (PERCOCET) 10-325 MG tablet Take 1 tablet by mouth every 6 (six) hours as needed for pain. 11/01/18   Laurey Morale, MD  pramipexole (MIRAPEX) 1 MG tablet TAKE 4 TABLETS(4 MG) BY MOUTH DAILY 10/09/18   Laurey Morale, MD  spironolactone (ALDACTONE) 25 MG tablet Take 1 tablet (25 mg total) by mouth 2 (two) times daily. 11/15/18   Laurey Morale, MD  terbinafine (LAMISIL) 250 MG tablet Take 1 tablet (250 mg total) by mouth daily. 06/07/18   Laurey Morale, MD  zolpidem (AMBIEN) 10 MG tablet Take 1 tablet (10 mg total) by mouth at bedtime as needed for up to 30 days for sleep. 11/01/18 12/01/18  Laurey Morale, MD    Family History Family History  Problem Relation Age of Onset  . Lupus Mother   . COPD Father     Social History Social History   Tobacco Use  . Smoking status: Never Smoker  . Smokeless tobacco: Never Used    Substance Use Topics  . Alcohol use: No    Alcohol/week: 0.0 standard drinks  . Drug use: No    Types: Oxycodone     Allergies   Lyrica [pregabalin]; Aspirin; Ciprofloxacin; Clarithromycin; Doxycycline; Erythromycin; Lisinopril; Macrobid [nitrofurantoin monohyd macro]; Metolazone; Other; Penicillins; Sulfonamide derivatives; and Valtrex [valacyclovir hcl]   Review of Systems Review of  Systems  Ten systems reviewed and are negative for acute change, except as noted in the HPI.   Physical Exam Updated Vital Signs BP 134/66 (BP Location: Right Arm)   Pulse 81   Temp 97.8 F (36.6 C) (Oral)   Resp 16   Physical Exam Vitals signs and nursing note reviewed.  Constitutional:      General: She is not in acute distress.    Appearance: She is well-developed. She is not diaphoretic.  HENT:     Head: Normocephalic and atraumatic.  Eyes:     General: Vision grossly intact. Gaze aligned appropriately. No scleral icterus.    Extraocular Movements:     Right eye: Normal extraocular motion and no nystagmus.     Left eye: Normal extraocular motion and no nystagmus.     Conjunctiva/sclera: Conjunctivae normal.     Pupils: Pupils are equal, round, and reactive to light.     Visual Fields: Right eye visual fields normal and left eye visual fields normal.  Neck:     Musculoskeletal: Normal range of motion and neck supple. Muscular tenderness present. No spinous process tenderness.   Cardiovascular:     Rate and Rhythm: Normal rate and regular rhythm.  Pulmonary:     Effort: Pulmonary effort is normal. No respiratory distress.     Breath sounds: Normal breath sounds. No wheezing or rales.  Abdominal:     General: Bowel sounds are normal.     Palpations: Abdomen is soft.     Tenderness: There is no abdominal tenderness. There is no guarding or rebound.  Musculoskeletal: Normal range of motion.  Lymphadenopathy:     Cervical: No cervical adenopathy.  Skin:    General: Skin is warm  and dry.     Findings: No rash.  Neurological:     Mental Status: She is alert and oriented to person, place, and time.     Cranial Nerves: No cranial nerve deficit.     Motor: No abnormal muscle tone.     Coordination: Coordination normal.     Deep Tendon Reflexes: Reflexes are normal and symmetric.     Comments: Mental Status:  Alert, oriented, thought content appropriate. Speech fluent without evidence of aphasia. Able to follow 2 step commands without difficulty.  Cranial Nerves:  II:  Peripheral visual fields grossly normal, pupils equal, round, reactive to light III,IV, VI: ptosis not present, extra-ocular motions intact bilaterally  V,VII: smile symmetric, facial light touch sensation equal VIII: hearing grossly normal bilaterally  IX,X: midline uvula rise  XI: bilateral shoulder shrug equal and strong XII: midline tongue extension  Motor:  5/5 in upper and lower extremities bilaterally including strong and equal grip strength and dorsiflexion/plantar flexion Equivocal left UE pronator drift Sensory: Pinprick and light touch normal in all extremities.  Deep Tendon Reflexes: 2+ and symmetric  Cerebellar: normal finger-to-nose with bilateral upper extremities Gait: normal gait and balance CV: distal pulses palpable throughout   Psychiatric:        Behavior: Behavior normal.        Thought Content: Thought content normal.        Judgment: Judgment normal.      ED Treatments / Results  Labs (all labs ordered are listed, but only abnormal results are displayed) Labs Reviewed  CBC - Abnormal; Notable for the following components:      Result Value   WBC 11.8 (*)    All other components within normal limits  DIFFERENTIAL - Abnormal; Notable for the  following components:   Neutro Abs 8.3 (*)    All other components within normal limits  COMPREHENSIVE METABOLIC PANEL - Abnormal; Notable for the following components:   Chloride 97 (*)    Glucose, Bld 312 (*)    BUN 32 (*)     Creatinine, Ser 1.01 (*)    GFR calc non Af Amer 51 (*)    GFR calc Af Amer 60 (*)    All other components within normal limits  PROTIME-INR  APTT  ETHANOL  RAPID URINE DRUG SCREEN, HOSP PERFORMED  URINALYSIS, ROUTINE W REFLEX MICROSCOPIC  CBG MONITORING, ED    EKG EKG Interpretation  Date/Time:  Thursday November 21 2018 13:37:12 EST Ventricular Rate:  82 PR Interval:  192 QRS Duration: 84 QT Interval:  404 QTC Calculation: 472 R Axis:   11 Text Interpretation:  Normal sinus rhythm Cannot rule out Anterior infarct , age undetermined Abnormal ECG No significant change since last tracing Confirmed by Isla Pence [phone]) on 11/21/2018 3:31:04 PM   Radiology Ct Head Wo Contrast  Result Date: 11/21/2018 CLINICAL DATA:  85 y/o  F; dizziness with onset this morning. EXAM: CT HEAD WITHOUT CONTRAST TECHNIQUE: Contiguous axial images were obtained from the base of the skull through the vertex without intravenous contrast. COMPARISON:  07/20/2019 CT head and MRI head. FINDINGS: Brain: No evidence of acute infarction, hemorrhage, hydrocephalus, extra-axial collection or mass lesion/mass effect. Nonspecific white matter hypodensities are compatible with chronic microvascular ischemic changes and there is volume loss of the brain stable from prior MRI given differences in technique. Interval small chronic appearing infarct within the left inferior cerebellar hemisphere. Very small chronic infarct within the left thalamus. Vascular: Calcific atherosclerosis of the carotid siphons and vertebral arteries. No hyperdense vessel identified. Skull: Normal. Negative for fracture or focal lesion. Sinuses/Orbits: Mucosal thickening within the anterior ethmoid air cells. Normal aeration of the additional visible paranasal sinuses and the mastoid air cells. Bilateral intra-ocular lens replacement. Other: None. IMPRESSION: 1. No acute intracranial abnormality identified. 2. Stable chronic microvascular  ischemic changes and volume loss of the brain given differences in technique. Interval small chronic infarction within left inferior cerebellar hemisphere. Electronically Signed   By: Kristine Garbe M.D.   On: 11/21/2018 14:54    Procedures Procedures (including critical care time)  Medications Ordered in ED Medications  sodium chloride flush (NS) 0.9 % injection 3 mL (has no administration in time range)     Initial Impression / Assessment and Plan / ED Course  I have reviewed the triage vital signs and the nursing notes.  Pertinent labs & imaging results that were available during my care of the patient were reviewed by me and considered in my medical decision making (see chart for details).  Clinical Course as of Nov 21 2328  Thu Nov 21, 2018  1726 Glucose(!): 312 [AH]  1726 Creatinine(!): 1.01 [AH]    Clinical Course User Index [AH] Margarita Mail, PA-C       85 year old female history of previous lacunar infarcts/mini strokes, had onset of visual disturbance, neck pain, vertigo lasting several hours that has resolved completely at this time.  Patient had CT, CT angios neck and head which were negative.  Given her previous history I discussed the case with Dr. Gilford Raid and decided to obtain MRI for definitive diagnosis.  Patient's MRI negative for acute infarct.  Patient is ambulatory here in the emergency department.  Her labs appear to be at baseline.  She has a mild hyperglycemia.  I personally reviewed the patient's CT, CT angios head and neck and MRI and agree with the radiologic interpretations.  Will be discharged with meclizine and ENT follow-up for vestibular rehab.  Patient appears appropriate for discharge at this time  Final Clinical Impressions(s) / ED Diagnoses   Final diagnoses:  None    ED Discharge Orders    None       Margarita Mail, PA-C 11/21/18 2358    Isla Pence, MD 11/23/18 [phone]

## 2018-11-21 NOTE — ED Notes (Signed)
Patient transported to MRI 

## 2018-11-21 NOTE — Telephone Encounter (Signed)
New Message   STAT if patient feels like he/she is going to faint   1) Are you dizzy now? yes  2) Do you feel faint or have you passed out? no  3) Do you have any other symptoms? Dizziness, edema, clammy, numbness in the left arm, headache.   4) Have you checked your HR and BP (record if available)? BP 168/54 HR 95-100   Pt c/o swelling: STAT is pt has developed SOB within 24 hours  1) How much weight have you gained and in what time span?  No   2) If swelling, where is the swelling located? Feet and ankle  3) Are you currently taking a fluid pill? Yes  4) Are you currently SOB? yes  5) Do you have a log of your daily weights (if so, list)?   6) Have you gained 3 pounds in a day or 5 pounds in a week?   7) Have you traveled recently? No    Millie the medical social worker says she visually sees the gain but the patient does not weigh herself daily.

## 2018-11-21 NOTE — Telephone Encounter (Signed)
Will send this message to Dr Meda Coffee as an Juluis Rainier, upon return to the office.

## 2018-11-22 ENCOUNTER — Telehealth: Payer: Self-pay | Admitting: *Deleted

## 2018-11-22 NOTE — Telephone Encounter (Signed)
Form is in the red folder to be signed.  Please advise once completed.  Thanks.

## 2018-11-22 NOTE — Telephone Encounter (Signed)
I already ordered this per EPIC on 09-05-18. Please ask the pharmacy if there is another way to order this

## 2018-11-22 NOTE — Telephone Encounter (Signed)
The form is ready  

## 2018-11-22 NOTE — Telephone Encounter (Signed)
Copied from [place] [phone]. Topic: General - Other >> Nov 21, 2018  7:37 AM Carolyn Stare wrote:  Pt said Dr Sarajane Jews told her if she had issues ordering the  [place]  that he would order for her and she is asking if he will order it for her   Dr. Sarajane Jews please advise if ok to order this for the pt.  Thanks

## 2018-11-25 NOTE — Telephone Encounter (Signed)
The form has been faxed back and placed in scan folder.

## 2018-11-26 ENCOUNTER — Telehealth: Payer: Self-pay | Admitting: Family Medicine

## 2018-11-26 MED ORDER — FREESTYLE LIBRE 14 DAY READER DEVI: 1.0000 | 6 refills | Status: DC

## 2018-11-26 NOTE — Telephone Encounter (Signed)
Copied from [place] [phone]. Topic: Quick Communication - Home Health Verbal Orders >> Nov 26, 2018  1:08 PM Lennox Solders wrote: Caller/Agency: Gaspar Garbe [facility] Callback Number: [phone] Requesting Skilled Nursing 2 x 2 and 1 x 4

## 2018-11-26 NOTE — Telephone Encounter (Signed)
Called [facility] and spoke with Irven [place], RN---VO given for skilled nursing.

## 2018-11-27 ENCOUNTER — Telehealth: Payer: Self-pay | Admitting: *Deleted

## 2018-11-27 NOTE — Progress Notes (Signed)
[facility] Summary Report / Loop Recorder 

## 2018-11-27 NOTE — Telephone Encounter (Signed)
Copied from [place] [phone]. Topic: Quick Communication - Rx Refill/Question >> Nov 27, 2018 12:44 PM Carolyn Stare wrote: Pharmacy [facility] call to ask if Dr Sarajane Jews meant the sensor and not the reader     he said with the directions it should be the sensor     Continuous Blood Gluc Receiver (FREESTYLE LIBRE 14 DAY READER) [place]  . Agent: Please be advised that RX refills may take up to 3 business days. We ask that you follow-up with your pharmacy.

## 2018-11-28 ENCOUNTER — Telehealth: Payer: Self-pay | Admitting: Family Medicine

## 2018-11-28 NOTE — Telephone Encounter (Signed)
Copied from [place] [phone]. Topic: Quick Communication - Home Health Verbal Orders >> Nov 28, 2018  9:51 AM Margot Ables wrote: Caller/Agency: Constance Haw PT w/[facility] Number: [phone], secure VM if not available Requesting OT/PT/Skilled Nursing/Social Work: PT Frequency: requesting VO for PT starting next week 2x week for 3 weeks

## 2018-11-28 NOTE — Telephone Encounter (Signed)
Called and spoke with Almira and the VO has been given to her.

## 2018-12-02 DIAGNOSIS — I110 Hypertensive heart disease with heart failure: Secondary | ICD-10-CM | POA: Diagnosis not present

## 2018-12-02 DIAGNOSIS — J449 Chronic obstructive pulmonary disease, unspecified: Secondary | ICD-10-CM

## 2018-12-02 DIAGNOSIS — Z794 Long term (current) use of insulin: Secondary | ICD-10-CM

## 2018-12-02 DIAGNOSIS — I69393 Ataxia following cerebral infarction: Secondary | ICD-10-CM

## 2018-12-02 DIAGNOSIS — E1142 Type 2 diabetes mellitus with diabetic polyneuropathy: Secondary | ICD-10-CM

## 2018-12-02 DIAGNOSIS — I2510 Atherosclerotic heart disease of native coronary artery without angina pectoris: Secondary | ICD-10-CM

## 2018-12-02 DIAGNOSIS — E1151 Type 2 diabetes mellitus with diabetic peripheral angiopathy without gangrene: Secondary | ICD-10-CM

## 2018-12-02 DIAGNOSIS — I5033 Acute on chronic diastolic (congestive) heart failure: Secondary | ICD-10-CM

## 2018-12-02 DIAGNOSIS — I69322 Dysarthria following cerebral infarction: Secondary | ICD-10-CM

## 2018-12-02 DIAGNOSIS — I69311 Memory deficit following cerebral infarction: Secondary | ICD-10-CM

## 2018-12-03 ENCOUNTER — Telehealth: Payer: Self-pay | Admitting: Family Medicine

## 2018-12-03 NOTE — Telephone Encounter (Signed)
Copied from [place] [phone]. Topic: Quick Communication - Home Health Verbal Orders >> Dec 03, 2018  1:22 PM Scherrie Gerlach wrote: Caller/Agency: [name] / [facility] Callback Number: [phone] Requesting Social Worker eval to help with community resources

## 2018-12-04 MED ORDER — FREESTYLE LIBRE 14 DAY READER DEVI: 1.0000 | Freq: Every day | 0 refills | Status: DC

## 2018-12-04 NOTE — Telephone Encounter (Signed)
I have called the pharmacy and cleared up the prescription that the pt was needing.

## 2018-12-05 NOTE — Telephone Encounter (Signed)
Called and spoke with [name] with [facility] and she is aware of VO for the social worker order.

## 2018-12-05 NOTE — Telephone Encounter (Signed)
Melanie Mcdonald stated she did not get a return call about the request for a social worker eval. Please advise.

## 2018-12-09 ENCOUNTER — Telehealth: Payer: Self-pay | Admitting: Family Medicine

## 2018-12-09 ENCOUNTER — Other Ambulatory Visit: Payer: Self-pay | Admitting: Family Medicine

## 2018-12-09 MED ORDER — FREESTYLE LIBRE 14 DAY SENSOR MISC: 1.0000 "application " | 6 refills | Status: DC

## 2018-12-09 MED ORDER — INSULIN DEGLUDEC 100 UNIT/ML ~~LOC~~ SOPN: 80.0000 [IU] | PEN_INJECTOR | Freq: Every morning | SUBCUTANEOUS | 5 refills | Status: DC

## 2018-12-09 NOTE — Telephone Encounter (Signed)
Copied from [place] [phone]. Topic: Quick Communication - See Telephone Encounter >> Dec 09, 2018  3:36 PM Rayann Heman wrote: CRM for notification. See Telephone encounter for: 12/09/18. Millie calling with [facility] called and stated that she thinks that the patient should be referred out to pain management. Pt also would like an RX of gabapentin. Please advise

## 2018-12-09 NOTE — Telephone Encounter (Signed)
Copied from [place] [phone]. Topic: Quick Communication - Rx Refill/Question >> Dec 09, 2018  3:34 PM Rayann Heman wrote: Medication: oxyCODONE-acetaminophen (PERCOCET) 10-325 MG tablet [[id number]]   Has the patient contacted their pharmacy?no Preferred Pharmacy (with phone number or street name): [id number] - [place], [place] [id number] (Phone) [phone] (Fax)  Agent: Please be advised that RX refills may take up to 3 business days. We ask that you follow-up with your pharmacy.

## 2018-12-09 NOTE — Telephone Encounter (Signed)
The insulin has been sent to the pharmacy with the correction.  The sensors have already been taken care of.

## 2018-12-09 NOTE — Telephone Encounter (Signed)
Pharmacy called back about the [place] sensors. They needed the ok to fill this for the pt.  This has been approved.

## 2018-12-09 NOTE — Telephone Encounter (Signed)
Dr. Sarajane Jews please advise on request for gabapentin refill and referral to Pain Management.

## 2018-12-09 NOTE — Telephone Encounter (Signed)
No referral is needed. Call in Gabapentin #[phone] with 5 rf. For the Percocet, she will need a PMV

## 2018-12-09 NOTE — Telephone Encounter (Signed)
Dr. Fry please advise on refill. Thanks  

## 2018-12-09 NOTE — Telephone Encounter (Signed)
Copied from [place] [id number]. Topic: Quick Communication - Rx Refill/Question >> Dec 09, 2018  9:03 AM Alanda Slim E wrote: Medication: insulin degludec (TRESIBA FLEXTOUCH) 100 UNIT/ML SOPN FlexTouch Pen - the quantity says 9ML but the Pt advised that it should be 9 pens/Pharmacy needs clarity   Continuous Blood Gluc Receiver (FREESTYLE LIBRE 14 DAY READER) DEVI - Pt needs the sensors not the device - pharmacy needs clarity   Has the patient contacted their pharmacy? yes  pharmacy called for clarity of the above meds  Preferred Pharmacy (with phone number or street name): [id number] - [place], [place] [phone] (Phone) [phone] (Fax)    Agent: Please be advised that RX refills may take up to 3 business days. We ask that you follow-up with your pharmacy.

## 2018-12-10 ENCOUNTER — Telehealth: Payer: Self-pay

## 2018-12-10 MED ORDER — GABAPENTIN 300 MG PO CAPS: 300.0000 mg | ORAL_CAPSULE | Freq: Three times a day (TID) | ORAL | 5 refills | Status: DC

## 2018-12-10 NOTE — Telephone Encounter (Signed)
Copied from [place] [id number]. Topic: General - Other >> Dec 10, 2018  8:41 AM Leward Quan A wrote: Reason for CRM: Patient called to say that she was given only 3 of her insulin degludec (TRESIBA FLEXTOUCH) 100 UNIT/ML SOPN FlexTouch Pen and not 9 like stated on the Rx. She stated that she was informed by pharmacy that she need a prior authorization in order to get 9 pens. Also stated that she received the Continuous Blood Gluc Receiver (FREESTYLE LIBRE 14 DAY READER) DEVI instead of the Continuous Blood Gluc Sensor (FREESTYLE LIBRE 14 DAY SENSOR) MISC Please advise patient is requesting a call back please. Ph# [phone]

## 2018-12-10 NOTE — Telephone Encounter (Signed)
Called and spoke with pt and she is aware of the gabapentin has been sent to the pharmacy.  She stated that since using the gabapentin she has not been hurting as much and would like to wait to make the PMV appt.  She will call back when she is ready to schedule that appt.

## 2018-12-10 NOTE — Telephone Encounter (Signed)
Called the pharmacy and he stated that she picked up 3 pens on 3/6 and that will last her till 3/13.  We will have to wait to see if this will require PA.  He stated that they cannot go back and change this from their end and cannot run another claim until 3/13 when the rx is due.

## 2018-12-13 ENCOUNTER — Ambulatory Visit: Payer: Medicare Other | Admitting: Podiatry

## 2018-12-13 ENCOUNTER — Ambulatory Visit (INDEPENDENT_AMBULATORY_CARE_PROVIDER_SITE_OTHER): Payer: Medicare Other

## 2018-12-13 ENCOUNTER — Other Ambulatory Visit: Payer: Self-pay | Admitting: Podiatry

## 2018-12-13 ENCOUNTER — Other Ambulatory Visit: Payer: Self-pay

## 2018-12-13 DIAGNOSIS — M2011 Hallux valgus (acquired), right foot: Secondary | ICD-10-CM

## 2018-12-13 DIAGNOSIS — E1142 Type 2 diabetes mellitus with diabetic polyneuropathy: Secondary | ICD-10-CM | POA: Diagnosis not present

## 2018-12-13 DIAGNOSIS — L84 Corns and callosities: Secondary | ICD-10-CM | POA: Diagnosis not present

## 2018-12-13 DIAGNOSIS — M79671 Pain in right foot: Secondary | ICD-10-CM

## 2018-12-13 DIAGNOSIS — M2041 Other hammer toe(s) (acquired), right foot: Secondary | ICD-10-CM | POA: Diagnosis not present

## 2018-12-18 ENCOUNTER — Telehealth: Payer: Self-pay | Admitting: Family Medicine

## 2018-12-18 ENCOUNTER — Ambulatory Visit: Payer: Self-pay | Admitting: Family Medicine

## 2018-12-18 NOTE — Telephone Encounter (Signed)
Pt called in and had social worker, Gabriel Rung from [facility] on the line too.  The pt called their nurse line. Pt was fine this morning.   She laid down and took a nap and when  She woke up a few minutes ago she is having a sharp pain in her left side.   She has a cracked rib on that side from a fall a few weeks ago. See triage notes.  I called the flow coordinator, Arbie Cookey and made her aware of the situation.   They are going to check with Dr. Sarajane Jews and call the pt back.    I let the pt and the social worker know that Dr. Barbie Banner office will be calling the pt back.       Answer Assessment - Initial Assessment Questions 1. RESPIRATORY STATUS: "Describe your breathing?" (e.g., wheezing, shortness of breath, unable to speak, severe coughing)      I was fine this morning.    The nurse came out and did exercises and walking with me.   Dr. Sarajane Jews said I had a cracked rib from a fall.    Now it hurts to breath when I got up from taking a nap.    2. ONSET: "When did this breathing problem begin?"      Just a few minutes ago.      Worst pain is on left side where I have a broken rib.   3-4 wks ago I fell and broke my rib.   Rib is cracked.    3. PATTERN "Does the difficult breathing come and go, or has it been constant since it started?"      It hurts worse when I move. 4. SEVERITY: "How bad is your breathing?" (e.g., mild, moderate, severe)    - MILD: No SOB at rest, mild SOB with walking, speaks normally in sentences, can lay down, no retractions, pulse < 100.    - MODERATE: SOB at rest, SOB with minimal exertion and prefers to sit, cannot lie down flat, speaks in phrases, mild retractions, audible wheezing, pulse 100-120.    - SEVERE: Very SOB at rest, speaks in single words, struggling to breathe, sitting hunched forward, retractions, pulse > 120      I don't really feel short of breath.   Worse when I try to move certain ways. 5. RECURRENT SYMPTOM: "Have you had difficulty breathing before?" If  so, ask: "When was the last time?" and "What happened that time?"      No.  I've been sore since I fell. 6. CARDIAC HISTORY: "Do you have any history of heart disease?" (e.g., heart attack, angina, bypass surgery, angioplasty)      I have loop recorder.   I have CHF. 7. LUNG HISTORY: "Do you have any history of lung disease?"  (e.g., pulmonary embolus, asthma, emphysema)     COPD, no asthma.    No blood clot. 8. CAUSE: "What do you think is causing the breathing problem?"      Maybe way I moved.   I'm surprised the way it hurt.   9. OTHER SYMPTOMS: "Do you have any other symptoms? (e.g., dizziness, runny nose, cough, chest pain, fever)     No 10. PREGNANCY: "Is there any chance you are pregnant?" "When was your last menstrual period?"       Not asked due to age 23. TRAVEL: "Have you traveled out of the country in the last month?" (e.g., travel history, exposures)  No  Been in my house and doctor's office that's all.  Protocols used: BREATHING DIFFICULTY-A-AH

## 2018-12-18 NOTE — Telephone Encounter (Signed)
Copied from [place] [id number]. Topic: Quick Communication - Home Health Verbal Orders >> Dec 18, 2018  9:25 AM Valla Leaver wrote: Caller/Agency: [name], PT w/ [facility] Callback Number: [phone] Requesting OT/PT/Skilled Nursing/Social Work/Speech Therapy: PT Frequency: 2x a wk for 4wks and 1x a week for 1wk

## 2018-12-18 NOTE — Telephone Encounter (Signed)
Dr. Fry please advise. Thanks  

## 2018-12-18 NOTE — Telephone Encounter (Signed)
I have called and left a Vm for B[place]y giving the VO for the PT.

## 2018-12-18 NOTE — Telephone Encounter (Signed)
The PEC called and is sending over the triage notes for this patient.   I let the PEC know that we will send you a note to see if you want the patient to come in and be seen or just to come in to have more X-rays done. I told the PEC that the patient will be called with a decision.  Please Advise

## 2018-12-19 ENCOUNTER — Telehealth: Payer: Self-pay | Admitting: Family Medicine

## 2018-12-19 NOTE — Telephone Encounter (Signed)
Copied from [place] [phone]. Topic: Quick Communication - See Telephone Encounter >> Dec 19, 2018 10:19 AM Ahmed Prima L wrote: CRM for notification. See Telephone encounter for: 12/19/18.  Tranace-RN with [facility] called and said her weight yesterday was 236 and today it is 240 - they are suppose to call if it is a 3 pound weight gain. She does have some edema to her lower legs and has not took her lasix's yet. She can be reached at [phone]

## 2018-12-19 NOTE — Telephone Encounter (Signed)
Dr. Sarajane Jews please advise on weight gain.  Thanks

## 2018-12-19 NOTE — Telephone Encounter (Signed)
Please call the patient and get more information asap

## 2018-12-19 NOTE — Telephone Encounter (Signed)
Spoke with the nurse at the pts home and she is aware of Dr. Barbie Banner recs and will have the pt take her lasix.

## 2018-12-19 NOTE — Telephone Encounter (Signed)
Called and spoke with pt and she stated that she had to twist the other day to pick something up and had this severe pain across the left rib cage area and down into her body.  She stated that since that time she is still having pain in this area and standing and moving makes the pain worse.  Pt stated that she has a nurse visit today at 9.  She stated that she cannot come in today due to transportation and tomorrow she already has 2 doctor appts.  Dr. Sarajane Jews please advise. Thanks

## 2018-12-19 NOTE — Telephone Encounter (Signed)
Tell her to go ahead and take her Lasix

## 2018-12-19 NOTE — Telephone Encounter (Signed)
Tell her that she obviously reinjured the cracked rib. There is not much to do but give it time to heal. Let us know if she gets any more SOB

## 2018-12-19 NOTE — Telephone Encounter (Signed)
Spoke with the nurse at the pts home and she is aware of Dr. Barbie Banner recs and she will pass this information along.

## 2018-12-20 ENCOUNTER — Ambulatory Visit: Payer: Medicare Other | Admitting: Neurology

## 2018-12-23 ENCOUNTER — Ambulatory Visit (INDEPENDENT_AMBULATORY_CARE_PROVIDER_SITE_OTHER): Payer: Medicare Other | Admitting: *Deleted

## 2018-12-23 ENCOUNTER — Other Ambulatory Visit: Payer: Self-pay

## 2018-12-23 DIAGNOSIS — I6340 Cerebral infarction due to embolism of unspecified cerebral artery: Secondary | ICD-10-CM | POA: Diagnosis not present

## 2018-12-24 LAB — CUP PACEART REMOTE DEVICE CHECK
Date Time Interrogation Session: 20200323034008
Implantable Pulse Generator Implant Date: 20181022

## 2018-12-25 ENCOUNTER — Telehealth: Payer: Self-pay | Admitting: Cardiology

## 2018-12-25 ENCOUNTER — Telehealth: Payer: Self-pay | Admitting: Family Medicine

## 2018-12-25 ENCOUNTER — Telehealth: Payer: Self-pay | Admitting: *Deleted

## 2018-12-25 NOTE — Telephone Encounter (Signed)
Received information from after hours line. Patient called reporting having sore throat, body aches, and ear ache.   RN called patient. Patient denied having lots of shortness of breath (reports she has shortness of breath d/t CHF), no cough. Patient reports she aches d/t arthritis.  Patient would like a telephone visit with Dr. Sarajane Jews tomorrow for refills and her acute issues.  Scheduled patient for 3/26 at 9AM.

## 2018-12-25 NOTE — Telephone Encounter (Signed)
Pt was not interested in doing a telephone visit or Virtual Video Visit at this time.  Pt wanted her appt cancelled and we can call her back at a later time.  Pt voiced she has no cardiac complaints at this time, and she is good on all cardiac medications.  Will send this to the covid 19 cancel pool as PRIORITY # 1.

## 2018-12-25 NOTE — Telephone Encounter (Signed)
Pt called and said that has fever and headache, for last couple day wants to now about getting an appt.

## 2018-12-25 NOTE — Telephone Encounter (Signed)
Called and spoke with pt and she stated that she woke up this morning with a sore throat and has been having ear pain for months.  This pain comes and goes.  She stated that she has body aches all the time due to arthritis and these body aches do not feel any different.  She is having chills at times but denies any fever.  Pt will be placed on the schedule for 3/26 at 9 and this will be a telephone call

## 2018-12-25 NOTE — Telephone Encounter (Signed)
Called Melanie Mcdonald who does not have access to internet or smart phone. Melanie Mcdonald refusing phone visit at this time as she does not feel it will be useful. Advised Melanie Mcdonald of Dr. Thera Flake recommendation for phone visit. Melanie Mcdonald adamant about only scheduling for an in person office visit.  Advised Melanie Mcdonald to call back with any new cardiac symptoms. Will route to Dr. Meda Coffee to advise.

## 2018-12-25 NOTE — Telephone Encounter (Signed)
Called the pt and she stated that she has not run a fever at all.  Her last temp that she took was earlier today and it was 97.5.  She stated that she had a headache earlier but this is gone now.  She stated that she will keep this appt with Dr. Sarajane Jews in the morning at 9 am.  She is aware that Dr. Sarajane Jews will call her at that time.

## 2018-12-25 NOTE — Telephone Encounter (Signed)
I have reviewed patients chart and her appointment should be changed for a telemedicine appointment on 12/31/2018.  Ena Dawley, MD 12/25/2018

## 2018-12-26 ENCOUNTER — Ambulatory Visit (INDEPENDENT_AMBULATORY_CARE_PROVIDER_SITE_OTHER): Payer: Medicare Other | Admitting: Family Medicine

## 2018-12-26 ENCOUNTER — Encounter: Payer: Self-pay | Admitting: Family Medicine

## 2018-12-26 DIAGNOSIS — I10 Essential (primary) hypertension: Secondary | ICD-10-CM

## 2018-12-26 DIAGNOSIS — Z794 Long term (current) use of insulin: Secondary | ICD-10-CM

## 2018-12-26 DIAGNOSIS — M150 Primary generalized (osteo)arthritis: Secondary | ICD-10-CM | POA: Diagnosis not present

## 2018-12-26 DIAGNOSIS — M7989 Other specified soft tissue disorders: Secondary | ICD-10-CM | POA: Diagnosis not present

## 2018-12-26 DIAGNOSIS — R42 Dizziness and giddiness: Secondary | ICD-10-CM

## 2018-12-26 DIAGNOSIS — E119 Type 2 diabetes mellitus without complications: Secondary | ICD-10-CM

## 2018-12-26 DIAGNOSIS — I509 Heart failure, unspecified: Secondary | ICD-10-CM

## 2018-12-26 MED ORDER — HYDROCODONE-HOMATROPINE 5-1.5 MG/5ML PO SYRP: 5.0000 mL | ORAL_SOLUTION | ORAL | 0 refills | Status: DC | PRN

## 2018-12-26 MED ORDER — OXYCODONE-ACETAMINOPHEN 10-325 MG PO TABS: 1.0000 | ORAL_TABLET | ORAL | 0 refills | Status: DC | PRN

## 2018-12-26 MED ORDER — MECLIZINE HCL 25 MG PO TABS: 25.0000 mg | ORAL_TABLET | Freq: Three times a day (TID) | ORAL | 5 refills | Status: DC | PRN

## 2018-12-26 MED ORDER — PROMETHAZINE HCL 25 MG PO TABS: 25.0000 mg | ORAL_TABLET | ORAL | 5 refills | Status: DC | PRN

## 2018-12-26 NOTE — Telephone Encounter (Signed)
Postpone for 6-12 weeks, please ask her to call us if she has symptoms

## 2018-12-26 NOTE — Telephone Encounter (Signed)
Left the pt a detailed message about postponing her follow-up appt with Dr Meda Coffee for 6/12 weeks out, and advised her to call back if she has any cardiac symptoms occur.  Advised the pt to call back if she has any further questions regarding this message left.  Will send to covid cancel pool for priority follow-up of #2.

## 2018-12-26 NOTE — Progress Notes (Signed)
   Subjective:    Patient ID: Melanie Mcdonald, female    DOB: [birth year], 85 y.o.   MRN: [id number]  HPI Virtual Visit via Telephone Note  I connected with the patient on 12/26/18 at  9:00 AM EDT by telephone and verified that I am speaking with the correct person using two identifiers.   I discussed the limitations, risks, security and privacy concerns of performing an evaluation and management service by telephone and the availability of in person appointments. I also discussed with the patient that there may be a patient responsible charge related to this service. The patient expressed understanding and agreed to proceed.  Location patient: home Location provider: work or home office Participants present for the call: patient, provider Patient did not have a visit in the prior 7 days to address this/these issue(s).   History of Present Illness: She is doing fairly well. No SOB. Her feet are swelling as usual, but they are not painful. Her glucoses have been stable. She is enjoying using the Freestyle Libra testing system. Her insurance finally approved this. She needs some refills, including a few more Percocet for pain. Her dizziness comes and goes and she still gets some relief by taking Meclizine.    Observations/Objective: Patient sounds cheerful and well on the phone. I do not appreciate any SOB. Speech and thought processing are grossly intact. Patient reported vitals:  Assessment and Plan: I sent in refills for a few medications. She seems to be doing well as far as her diabetes and CHF. She will stay in touch if she needs anything.   Follow Up Instructions:     [id number] [id number] [id number] 11-20 [id number] 21-30 I did not refer this patient for an OV in the next 24 hours for this/these issue(s).  I discussed the assessment and treatment plan with the patient. The patient was provided an opportunity to ask questions and all were answered. The patient agreed with the plan and  demonstrated an understanding of the instructions.   The patient was advised to call back or seek an in-person evaluation if the symptoms worsen or if the condition fails to improve as anticipated.  I provided 28 minutes of non-face-to-face time during this encounter.   Alysia Penna, MD    Review of Systems     Objective:   Physical Exam        Assessment & Plan:

## 2018-12-31 ENCOUNTER — Ambulatory Visit: Payer: Medicare Other | Admitting: Cardiology

## 2018-12-31 ENCOUNTER — Encounter

## 2019-01-01 NOTE — Progress Notes (Signed)
[facility] Summary Report / Loop Recorder 

## 2019-01-02 ENCOUNTER — Ambulatory Visit: Payer: Self-pay | Admitting: *Deleted

## 2019-01-02 MED ORDER — LEVOFLOXACIN 500 MG PO TABS: 500.0000 mg | ORAL_TABLET | Freq: Every day | ORAL | 0 refills | Status: DC

## 2019-01-02 NOTE — Telephone Encounter (Signed)
Called and spoke with pt and she is aware of meds sent to the pharmacy.

## 2019-01-02 NOTE — Telephone Encounter (Signed)
Dr. Sarajane Jews please advise if you would like to do a telephone visit with her?  Thanks

## 2019-01-02 NOTE — Addendum Note (Signed)
Addended by: Elie Confer on: 01/02/2019 09:57 AM   Modules accepted: Orders

## 2019-01-02 NOTE — Telephone Encounter (Signed)
Call in Levaquin 500 mg daily for 10 days  

## 2019-01-02 NOTE — Telephone Encounter (Signed)
Pt called stating that her cat bit her on 01/01/2019; the bit the pt on her right hand, and she described it as deep; the dime-nickel sized area is bruised and red; the cat had rabies shot, but the pt is now sure of the date; recommendations made per nurse triage protocol; the pt normally sees Dr Annie Main Fru, [facility]; notified Jinny Blossom and she states that the office will contact the pt; will route to office for final disposition.  Reason for Disposition . [1] Puncture wound or small cut AND [2] on hands or genitals  Answer Assessment - Initial Assessment Questions 1. ANIMAL: "What type of animal caused the bite?" "Is the injury from a bite or a claw?" If the animal is a dog or a cat, ask: "Was it a pet or a stray?" "Was it acting ill or behaving strangely?"     Pt's cat bit her after she stepped on the cat 2. LOCATION: "Where is the bite located?"    Right had 3. SIZE: "How big is the bite?" "What does it look like?"      Dime to nickel sized 4. ONSET: "When did the bite happen?" (Minutes or hours ago)      01/01/2019 5. CIRCUMSTANCES: "Tell me how this happened."    Pt stepped on cat by mistake; she rubbed the cat and must have hit a sore spot 6. TETANUS: "When was the last tetanus booster?"     Yes within the last 2 years 7. PREGNANCY: "Is there any chance you are pregnant?" "When was your last menstrual period?"   no  Protocols used: ANIMAL BITE-A-AH

## 2019-01-15 NOTE — Progress Notes (Signed)
Subjective:  Patient ID: Melanie Mcdonald, female    DOB: [birth year],  MRN: [id number]  Chief Complaint  Patient presents with   Toe Pain    Right 1st toe lateral side "knot" which rubs on 2nd toe causing pain. Right 1st medial callous.    85 y.o. female presents with the above complaint. Hx as above.  Review of Systems: Negative except as noted in the HPI. Denies N/V/F/Ch.  Past Medical History:  Diagnosis Date   Abdominal pain, unspecified site 03/09/2014   Acute on chronic diastolic CHF (congestive heart failure), NYHA class 3 ([place]) 11/03/2014   Acute on chronic diastolic heart failure   Acute respiratory failure ([place]) 06/17/2018   ANEMIA 01/30/2008   ARTHRITIS 12/11/2007   BACK PAIN, CHRONIC 06/23/2008   Backache 06/23/2008   Qualifier: Diagnosis of  By: Niel Hummer MD, Willie R    BREAST CYST, RIGHT 12/17/2007   Cancer ([place])    Candidiasis of genitalia in female 07/20/2017   Cellulitis of left leg 08/14/2011   Cellulitis of right foot 06/13/2016   Cervicogenic headache 06/14/2015   Chest pain 06/08/2011   Atypical chest pain   Chest pain 07/20/2017   CHF (congestive heart failure) (HCC)    CHF (congestive heart failure), NYHA class II, chronic, systolic ([place]) 4/0/9811   Colon polyps    FRAGMENTS OF HYPERPLASTIC POLYP   CONTACT DERMATITIS 03/10/2009   COPD (chronic obstructive pulmonary disease) ([place])    CORONARY ARTERY DISEASE 03/01/2007   had a normal Myoview stress test 07-13-11   Coronary atherosclerosis 03/01/2007   Qualifier: Diagnosis of  By: Cori Razor RN, Mikal Plane    CVA (cerebral vascular accident) ([place]) 07/20/2017   CYSTOCELE WITHOUT MENTION UTERINE PROLAPSE LAT 09/12/2010   DEGENERATIVE JOINT DISEASE 08/07/2007   DEPRESSION 03/01/2007   Depression, recurrent ([place]) 03/01/2007   Qualifier: Diagnosis of  By: Cori Razor RN, Regina G    DIABETES MELLITUS, TYPE II 08/26/2008   Diabetic polyneuropathy associated with diabetes mellitus due to underlying  condition ([place]) 06/14/2015   Diarrhea 08/15/2011   Diverticulosis of colon (without mention of hemorrhage)    Dizziness 02/10/2014   DOE (dyspnea on exertion) 11/03/2014   Edema 09/02/2008   Centricity Description: EDEMA Qualifier: Diagnosis of  By: Niel Hummer MD, Lorinda Creed  Centricity Description: PERIPHERAL EDEMA Qualifier: Diagnosis of  By: Niel Hummer MD, [place] R    Esophageal reflux 11/06/2007   Essential hypertension 03/01/2007   Qualifier: Diagnosis of  By: Aurther Loft    Gait instability 11/13/2014   Gout 06/07/2016   Headache 12/30/2013   HIP PAIN 09/15/2010   Qualifier: Diagnosis of  By: Elease Hashimoto MD, Bruce     HYPERKERATOSIS 06/02/2009   HYPERLIPIDEMIA 08/26/2008   Hyperlipidemia 08/26/2008   Qualifier: Diagnosis of  By: Alfonso Ellis, Mikal Plane    HYPERTENSION 03/01/2007   Hypokalemia 08/15/2011   Insomnia 12/11/2007   Irritable bowel syndrome 01/26/2009   LABYRINTHITIS 11/03/2009   Labyrinthitis 11/03/2009   Qualifier: Diagnosis of  By: Niel Hummer MD, Willie R    Leg swelling 04/05/2016   Memory loss 12/30/2013   Narcolepsy without cataplexy 08/26/2008   Qualifier: Diagnosis of  By: Niel Hummer MD, Willie R    Narcolepsy without cataplexy(347.00) 08/26/2008   NEPHROLITHIASIS 04/29/2009   OBESITY 03/01/2007   Obstructive apnea 11/03/2014   Osteoarthritis 03/15/2018   Pain in joint, ankle and foot 12/24/2015   PERIPHERAL VASCULAR DISEASE 01/26/2009   Peripheral vascular disease ([place]) 01/26/2009   Qualifier: Diagnosis  of  By: Niel Hummer MD, [place] history of colonic polyps 03/09/2014   Stroke [facility])    SYNCOPE 08/26/2008   had brain MRI on 06-28-12 showing only chronic microvascular ischemia and atrophy    SYNDROME, RESTLESS LEGS 03/01/2007   Qualifier: Diagnosis of  By: Cori Razor RN, Mikal Plane    Thyroid nodule 04/28/2014   TIA (transient ischemic attack) 11/13/2014   TRANSIENT ISCHEMIC ATTACK 10/20/2009   had normal brain MRA with patent  vertebrals and carotids 06-28-12   Uncontrolled type 2 diabetes mellitus with hyperglycemia, with long-term current use of insulin ([place]) 10/09/2016   Weakness generalized 11/13/2014    Current Outpatient Medications:    clopidogrel (PLAVIX) 75 MG tablet, Take 1 tablet (75 mg total) by mouth daily., Disp: 30 tablet, Rfl: 11   Continuous Blood Gluc Receiver (FREESTYLE LIBRE 14 DAY READER) DEVI, 1 Device by Does not apply route daily. Use device daily as directed, Disp: 1 Device, Rfl: 0   Continuous Blood Gluc Sensor (FREESTYLE LIBRE 14 DAY SENSOR) MISC, 1 application by Does not apply route every 14 (fourteen) days., Disp: 2 each, Rfl: 6   furosemide (LASIX) 40 MG tablet, Take 2 tablets in the morning and 1 tablet in the evening (Patient taking differently: Take 40-80 mg by mouth See admin instructions. Take 2 tablets by mouth in the morning and 1 tablet in the evening), Disp: 60 tablet, Rfl: 11   gabapentin (NEURONTIN) 300 MG capsule, Take 1 capsule (300 mg total) by mouth 3 (three) times daily., Disp: 90 capsule, Rfl: 5   glucose blood (ONE TOUCH ULTRA TEST) test strip, 1 each by Other route as needed for other. Use as instructed, Disp: 100 each, Rfl: 1   HUMALOG KWIKPEN 100 UNIT/ML KwikPen, Inject 0.2 mLs (20 Units total) into the skin 3 (three) times daily before meals., Disp: 45 mL, Rfl: 5   insulin degludec (TRESIBA FLEXTOUCH) 100 UNIT/ML SOPN FlexTouch Pen, Inject 0.8 mLs (80 Units total) into the skin every morning., Disp: 9 pen, Rfl: 5   Insulin Pen Needle (NOVOFINE) 32G X 6 MM MISC, Check glucoses TID, Disp: 90 each, Rfl: 11   nitroGLYCERIN (NITROSTAT) 0.4 MG SL tablet, Place 0.4 mg under the tongue every 5 (five) minutes as needed for chest pain., Disp: , Rfl:    pramipexole (MIRAPEX) 1 MG tablet, TAKE 4 TABLETS(4 MG) BY MOUTH DAILY (Patient taking differently: Take 4 mg by mouth daily. ), Disp: 368 tablet, Rfl: 1   spironolactone (ALDACTONE) 25 MG tablet, Take 1 tablet (25 mg  total) by mouth 2 (two) times daily., Disp: 90 tablet, Rfl: 1   terbinafine (LAMISIL) 250 MG tablet, Take 1 tablet (250 mg total) by mouth daily., Disp: 90 tablet, Rfl: 1   HYDROcodone-homatropine (HYDROMET) 5-1.5 MG/5ML syrup, Take 5 mLs by mouth every 4 (four) hours as needed., Disp: 240 mL, Rfl: 0   levofloxacin (LEVAQUIN) 500 MG tablet, Take 1 tablet (500 mg total) by mouth daily., Disp: 10 tablet, Rfl: 0   meclizine (ANTIVERT) 25 MG tablet, Take 1 tablet (25 mg total) by mouth 3 (three) times daily as needed for dizziness., Disp: 60 tablet, Rfl: 5   oxyCODONE-acetaminophen (PERCOCET) 10-325 MG tablet, Take 1 tablet by mouth every 4 (four) hours as needed for pain., Disp: 30 tablet, Rfl: 0   promethazine (PHENERGAN) 25 MG tablet, Take 1 tablet (25 mg total) by mouth every 4 (four) hours as needed for nausea or vomiting., Disp: 60 tablet, Rfl: 5  zolpidem (AMBIEN) 10 MG tablet, Take 1 tablet (10 mg total) by mouth at bedtime as needed for up to 30 days for sleep., Disp: 30 tablet, Rfl: 5  Social History   Tobacco Use  Smoking Status Never Smoker  Smokeless Tobacco Never Used    Allergies  Allergen Reactions   Lyrica [Pregabalin] Other (See Comments)    Suicidal    Aspirin Other (See Comments)    Abdominal pain   Ciprofloxacin Nausea And Vomiting    Made pt very sick on stomach   Clarithromycin Other (See Comments)    Made stomach "burn"   Doxycycline Itching   Erythromycin Nausea And Vomiting   Lisinopril Cough   Macrobid [Nitrofurantoin Monohyd Macro] Nausea And Vomiting   Metolazone Other (See Comments)    Pt stated this made her B/P drop   Other Nausea And Vomiting    Pt cannot have any of the -mycin(s)   Penicillins Swelling    From childhood: swelling @ injection site,childhood allergy Has patient had a PCN reaction causing immediate rash, facial/tongue/throat swelling, SOB or lightheadedness with hypotension: Yes Has patient had a PCN reaction causing  severe rash involving mucus membranes or skin necrosis: No Has patient had a PCN reaction that required hospitalization: No Has patient had a PCN reaction occurring within the last 10 years: No If all of the above answers are "NO", then may proceed with Cephalosporin use.   Sulfonamide Derivatives Nausea And Vomiting   Valtrex [Valacyclovir Hcl] Nausea Only   Objective:  There were no vitals filed for this visit. There is no height or weight on file to calculate BMI. Constitutional Well developed. Well nourished.  Vascular Dorsalis pedis pulses palpable bilaterally. Posterior tibial pulses palpable bilaterally. Capillary refill normal to all digits.  No cyanosis or clubbing noted. Pedal hair growth normal.  Neurologic Normal speech. Oriented to person, place, and time. Epicritic sensation to light touch grossly diminished bilaterally.  Dermatologic Nails well groomed and normal in appearance. No open wounds. Hyperkeratotic lesion medial 1st hallux IPJ.  Orthopedic: Hallux valgus right with second toe hammertoe deformity   Radiographs: Severe HAV deformity second hammertoe deformity Assessment:   1. Hallux valgus, right   2. Hammertoe of right foot   3. Type 2 diabetes mellitus with polyneuropathy (HCC)   4. Callus    Plan:  Patient was evaluated and treated and all questions answered.  Hallux valgus deformity second toe hammertoe -Debrided lesion with a 15 blade without incident to patient relief -Discussed padding and shoe gear tonight -Could potentially benefit from surgery however patient is hesitant  Procedure: Paring of Lesion Rationale: painful hyperkeratotic lesion Type of Debridement: manual, sharp debridement. Instrumentation: 312 blade Number of Lesions: 1   Return in about 6 weeks (around 01/24/2019) for Hallux valgus, hammertoe f/u .

## 2019-01-17 ENCOUNTER — Telehealth: Payer: Self-pay | Admitting: Family Medicine

## 2019-01-17 DIAGNOSIS — I5033 Acute on chronic diastolic (congestive) heart failure: Secondary | ICD-10-CM

## 2019-01-17 DIAGNOSIS — I739 Peripheral vascular disease, unspecified: Secondary | ICD-10-CM

## 2019-01-17 DIAGNOSIS — I10 Essential (primary) hypertension: Secondary | ICD-10-CM

## 2019-01-17 DIAGNOSIS — I2510 Atherosclerotic heart disease of native coronary artery without angina pectoris: Secondary | ICD-10-CM

## 2019-01-17 NOTE — Telephone Encounter (Signed)
Copied from [place] [id number]. Topic: Quick Communication - Rx Refill/Question >> Jan 17, 2019  3:18 PM Leward Quan A wrote: Medication: spironolactone (ALDACTONE) 25 MG tablet   Rx sent in 11/15/2018 was not recieved  Has the patient contacted their pharmacy? Yes.   (Agent: If no, request that the patient contact the pharmacy for the refill.) (Agent: If yes, when and what did the pharmacy advise?)  Preferred Pharmacy (with phone number or street name): [name] - [place], [place] [phone] (Phone) [phone] (Fax)    Agent: Please be advised that RX refills may take up to 3 business days. We ask that you follow-up with your pharmacy.

## 2019-01-20 ENCOUNTER — Telehealth: Payer: Self-pay | Admitting: Family Medicine

## 2019-01-20 MED ORDER — SPIRONOLACTONE 25 MG PO TABS: 25.0000 mg | ORAL_TABLET | Freq: Two times a day (BID) | ORAL | 1 refills | Status: DC

## 2019-01-20 NOTE — Telephone Encounter (Signed)
Copied from [place] [id number]. Topic: Quick Communication - Home Health Verbal Orders >> Jan 20, 2019  1:59 PM Margot Ables wrote: Caller/Agency: [name] PT with [facility] Callback Number: [phone], secure VM  Requesting OT/PT/Skilled Nursing/Social Work/Speech Therapy: PT Frequency: Requesting VO to continue PT 2 week 5, 1 week 1

## 2019-01-20 NOTE — Telephone Encounter (Signed)
Refills have been sent to the pharmacy 

## 2019-01-20 NOTE — Telephone Encounter (Signed)
Called and spoke with [facility] and she was given the VO for the PT.

## 2019-01-23 ENCOUNTER — Telehealth: Payer: Self-pay | Admitting: Family Medicine

## 2019-01-23 NOTE — Telephone Encounter (Signed)
Copied from [place] [id number]. Topic: Quick Communication - Home Health Verbal Orders >> Jan 23, 2019  4:36 PM Pauline Good wrote: Caller/Agency: [facility] Number: [phone] Requesting OT/PT/Skilled Nursing/Social Work/Speech Therapy: Nursing visit orders Frequency: 2x a wk 3 wk and 1x a wk for 5wks

## 2019-01-24 ENCOUNTER — Ambulatory Visit: Payer: Medicare Other | Admitting: Podiatry

## 2019-01-27 ENCOUNTER — Ambulatory Visit (INDEPENDENT_AMBULATORY_CARE_PROVIDER_SITE_OTHER): Payer: Medicare Other | Admitting: *Deleted

## 2019-01-27 ENCOUNTER — Other Ambulatory Visit: Payer: Self-pay

## 2019-01-27 DIAGNOSIS — I5033 Acute on chronic diastolic (congestive) heart failure: Secondary | ICD-10-CM

## 2019-01-27 DIAGNOSIS — I6340 Cerebral infarction due to embolism of unspecified cerebral artery: Secondary | ICD-10-CM | POA: Diagnosis not present

## 2019-01-27 LAB — CUP PACEART REMOTE DEVICE CHECK
Date Time Interrogation Session: 20200425033649
Implantable Pulse Generator Implant Date: 20181022

## 2019-01-31 ENCOUNTER — Telehealth: Payer: Self-pay | Admitting: Family Medicine

## 2019-01-31 NOTE — Telephone Encounter (Signed)
Copied from [place] [id number]. Topic: Quick Communication - See Telephone Encounter >> Jan 31, 2019  9:31 AM Blase Mess A wrote: CRM for notification. See Telephone encounter for: 01/31/19.Tomika Education officer, museum is calling with the patient with [facility] is requesting information to be sent to [employer] regarding [employer].  The patient has been out of medical Supplies since Monday and blood sugar has not been taken. Requesting OV notes, prescription, date last seen, Please advise. [facility] CB- [phone] Fax- [phone].

## 2019-02-04 NOTE — Telephone Encounter (Signed)
Tamika calling back and states that patient has not received medical supplies because [facility] has not received an Rx from the provider . The provider can call [phone] to send in supplies. Please advise    Tamika [id number] ex [id number]

## 2019-02-05 ENCOUNTER — Telehealth: Payer: Self-pay | Admitting: *Deleted

## 2019-02-05 NOTE — Progress Notes (Signed)
[facility] Summary Report / Loop Recorder 

## 2019-02-05 NOTE — Telephone Encounter (Signed)
Copied from [place] [phone]. Topic: Medical Record Request - Other >> Feb 05, 2019  1:42 PM Burchel, Abbi R wrote:  Requestor Name/Agency: Margreta [facility])  Call Back #: [phone] Information Requested: OV notes from 11/15/2018 and 05/03/2018.  Please fax to: [phone]

## 2019-02-05 NOTE — Telephone Encounter (Signed)
Copied from [place] [phone]. Topic: Medical Record Request - Other >> Feb 05, 2019  1:42 PM Burchel, Abbi R wrote:  Requestor Name/Agency: Margreta Journey [facility])  Call Back #: [phone] Information Requested: OV notes from 11/15/2018 and 05/03/2018.  Please fax to: [phone]

## 2019-02-06 NOTE — Telephone Encounter (Signed)
Both OV notes have been faxed to [facility] with [facility].

## 2019-02-06 NOTE — Telephone Encounter (Signed)
Called and spoke with  [facility] with [facility].  The order has been approved.

## 2019-02-06 NOTE — Telephone Encounter (Signed)
Information has been sent to [facility].

## 2019-02-07 ENCOUNTER — Telehealth: Payer: Self-pay | Admitting: Family Medicine

## 2019-02-07 NOTE — Telephone Encounter (Signed)
Dr. Fry please advise. Thanks  

## 2019-02-07 NOTE — Telephone Encounter (Signed)
The Lasix is supposed to be at 2 tablets (total of 80 mg) in the morning and one tablet (40 mg) in the evening. She does not need to be taking potassium because the Spironolactone preserves this in the kidneys. Her level in February was perfect

## 2019-02-07 NOTE — Telephone Encounter (Signed)
Called and spoke with  [name] and she is aware of medication for the pt and how the pt should be taking this.  She will call the pt back and just continue to reeducate the patient.

## 2019-02-07 NOTE — Telephone Encounter (Signed)
Laney Pastor, RN with [facility] called to report the patient's BP this morning 110/60, 3 lb weight gain overnight, 2+ BLE edema (no change from normal findings), increased cramping to the lower legs. She says the patient's O2 is 98% room air, reports no SOB at rest, SOB on exertion which is normal for the patient. She says the patient reports taking Lasix 80 mg BID and spironolactone 25 mg BID She says she's concerned the patient is not getting enough intake of water and maybe potassium, with the amount of Lasix the patient is taking, patient reports drinking little water and more milk. She asked what is the dosage indicated in her chart. I advised the last OV note on 11/15/18 noted to take Lasix 80 mg in the morning and 40 mg in the evening and it was noted for her to have leg compression pumps for the leg swelling. She asked the patient about the leg pumps and she says she doesn't have them. [name] says she would like a call back about the correct dosage of Lasix, if she's supposed to have the leg pumps. She says the patient is taking OTC potassium. She also says last week the patient called the home health nurse and reported left leg weeping, but no weeping observed today. CB for [facility] [phone]. I advised I will send to the office and someone will call back with Dr. Barbie Banner recommendations.

## 2019-02-11 DIAGNOSIS — I5033 Acute on chronic diastolic (congestive) heart failure: Secondary | ICD-10-CM

## 2019-02-12 ENCOUNTER — Telehealth: Payer: Self-pay | Admitting: *Deleted

## 2019-02-12 ENCOUNTER — Telehealth: Payer: Self-pay | Admitting: Family Medicine

## 2019-02-12 NOTE — Telephone Encounter (Signed)
Copied from [place] [phone]. Topic: General - Other >> Feb 12, 2019  9:32 AM Keene Breath wrote: Reason for CRM: Olivia Mackie with [facility] called to inform the doctor that the patient had a fall yesterday.  She stated that the patient said she was not injured from the fall.  Please advise and call back if there are any further questions.  CB# [phone]

## 2019-02-12 NOTE — Telephone Encounter (Signed)
Melanie Mcdonald called back and she is aware and will let the pt know to be seen at the ER.

## 2019-02-12 NOTE — Telephone Encounter (Signed)
She can follow up as needed

## 2019-02-12 NOTE — Telephone Encounter (Signed)
Copied from [place] [phone]. Topic: General - Other >> Feb 12, 2019  8:31 AM Celene Kras A wrote: Reason for CRM: [name], from [facility], called stating pt fell yesterday. She states pt is complaining of 10/10 pain last night. She states pt is complaining of pain in right lower abdomen, left rib area, and left shoulder. [name] states the pt is tender in these areas, but there is no bruising. [name] states the pts vital signs are in normal range. Pt took oxycodone this morning and the pain level decreased to 3/10. Please advise  Callback # [phone]

## 2019-02-12 NOTE — Telephone Encounter (Signed)
I have called [name] and left a detailed message on her VM

## 2019-02-12 NOTE — Telephone Encounter (Signed)
I advise her to go to the ER for evaluation

## 2019-02-12 NOTE — Telephone Encounter (Signed)
I have called B[place]y and left a message for her to call me back to advise.

## 2019-02-12 NOTE — Telephone Encounter (Signed)
ttracy called back and she stated that she spoke with the pt and she stated that the pt did not want to go to the ER and she is not letting anyone in her house.

## 2019-02-12 NOTE — Telephone Encounter (Signed)
Dr. Fry please advise. Thanks  

## 2019-02-12 NOTE — Telephone Encounter (Signed)
Copied from [place] [id number]. Topic: Quick Communication - See Telephone Encounter >> Feb 11, 2019  4:45 PM Loma [place] wrote: CRM for notification. See Telephone encounter for: 02/11/19. Pt would like a call tomorrow concerning her Elenor Legato for her diabetes. Her call back is [phone]

## 2019-02-13 ENCOUNTER — Telehealth: Payer: Self-pay | Admitting: Family Medicine

## 2019-02-13 NOTE — Telephone Encounter (Signed)
These forms have been faxed back in .

## 2019-02-13 NOTE — Telephone Encounter (Signed)
Copied from [place] [phone]. Topic: General - Other >> Feb 13, 2019  9:31 AM Keene Breath wrote: Reason for CRM: Tamica with [facility] called to get the status of the patient's freestyle libre supplies request that was faxed to the office on yesterday.  Please follow up with patient to let her know how soon this can be taken care of.  Patient's CB# [phone]

## 2019-02-14 DIAGNOSIS — E1151 Type 2 diabetes mellitus with diabetic peripheral angiopathy without gangrene: Secondary | ICD-10-CM

## 2019-02-14 DIAGNOSIS — I2510 Atherosclerotic heart disease of native coronary artery without angina pectoris: Secondary | ICD-10-CM

## 2019-02-14 DIAGNOSIS — J449 Chronic obstructive pulmonary disease, unspecified: Secondary | ICD-10-CM

## 2019-02-14 DIAGNOSIS — Z794 Long term (current) use of insulin: Secondary | ICD-10-CM

## 2019-02-14 DIAGNOSIS — I69393 Ataxia following cerebral infarction: Secondary | ICD-10-CM

## 2019-02-14 DIAGNOSIS — I69311 Memory deficit following cerebral infarction: Secondary | ICD-10-CM

## 2019-02-14 DIAGNOSIS — W5501XA Bitten by cat, initial encounter: Secondary | ICD-10-CM

## 2019-02-14 DIAGNOSIS — E1142 Type 2 diabetes mellitus with diabetic polyneuropathy: Secondary | ICD-10-CM

## 2019-02-14 DIAGNOSIS — I69322 Dysarthria following cerebral infarction: Secondary | ICD-10-CM

## 2019-02-14 DIAGNOSIS — I5033 Acute on chronic diastolic (congestive) heart failure: Secondary | ICD-10-CM

## 2019-02-21 ENCOUNTER — Ambulatory Visit (INDEPENDENT_AMBULATORY_CARE_PROVIDER_SITE_OTHER): Payer: Medicare Other

## 2019-02-21 ENCOUNTER — Encounter: Payer: Self-pay | Admitting: Internal Medicine

## 2019-02-21 ENCOUNTER — Ambulatory Visit (INDEPENDENT_AMBULATORY_CARE_PROVIDER_SITE_OTHER): Payer: Medicare Other | Admitting: Internal Medicine

## 2019-02-21 ENCOUNTER — Other Ambulatory Visit: Payer: Self-pay

## 2019-02-21 DIAGNOSIS — Z79899 Other long term (current) drug therapy: Secondary | ICD-10-CM

## 2019-02-21 DIAGNOSIS — S6992XA Unspecified injury of left wrist, hand and finger(s), initial encounter: Secondary | ICD-10-CM

## 2019-02-21 DIAGNOSIS — W19XXXA Unspecified fall, initial encounter: Secondary | ICD-10-CM | POA: Diagnosis not present

## 2019-02-21 DIAGNOSIS — R0781 Pleurodynia: Secondary | ICD-10-CM

## 2019-02-21 MED ORDER — OXYCODONE-ACETAMINOPHEN 10-325 MG PO TABS: 1.0000 | ORAL_TABLET | ORAL | 0 refills | Status: DC | PRN

## 2019-02-21 NOTE — Telephone Encounter (Signed)
Clinic RN spoke with patient. Clinic RN made patient a virtual visit with Dr. Regis Bill for evaluation since patient is complaining of severe pain.

## 2019-02-21 NOTE — Telephone Encounter (Signed)
Patient states she did not go to the Emergency Room because she does not want to be exposed to Covid with her age. She said she is still in a lot of pain and would like to know if Dr Sarajane Jews would refill her oxyCODONE-acetaminophen (PERCOCET) 10-325 MG tablet. Patient states she fell off the golf cart and hit her face.   Call on her Son's phone [phone] ( she is with him )  [facility] [place], [place] [phone] (Phone) [phone] (Fax

## 2019-02-21 NOTE — Progress Notes (Signed)
Virtual Visit via Video Note  I connected with@ on 02/21/19 at  2:30 PM EDT by a video enabled telemedicine application and verified that I am speaking with the correct person using two identifiers. Location patient: home Location provider:work  office Persons participating in the virtual visit: patient, provider and son    [place] national recommendations  regarding COVID 19 pandemic   video visit is advised over in office visit for this patient.  Patient aware  of the limitations of evaluation and management by telemedicine and  availability of in person appointments. and agreed to proceed.   HPI: Melanie Mcdonald presents for video visit  [place] as getting off her golf cart and twisted her back and fall rib and left hand ? If foosh or not   Since then feels has cracked rib and hurts to breath but not sob over baseline   Has a few ocycodone left from march some help  Asks about refilling medication for now    She was advised to seek ed care but feaful of going to ED .  No covid sx and exposures has been staying away  Mostly  With son   Has hx of ol fx left and feels cracked again  Some pain and swelling on left wrist and thumb but feels not as bad as rib cage  No fever cough     ROS: See pertinent positives and negatives per HPI.  Her ortho is dr Freida Busman  Past Medical History:  Diagnosis Date  . Abdominal pain, unspecified site 03/09/2014  . Acute on chronic diastolic CHF (congestive heart failure), NYHA class 3 ([place]) 11/03/2014   Acute on chronic diastolic heart failure  . Acute respiratory failure ([place]) 06/17/2018  . ANEMIA 01/30/2008  . ARTHRITIS 12/11/2007  . BACK PAIN, CHRONIC 06/23/2008  . Backache 06/23/2008   Qualifier: Diagnosis of  By: Niel Hummer MD, [place] CYST, RIGHT 12/17/2007  . Cancer ([place])   . Candidiasis of genitalia in female 07/20/2017  . Cellulitis of left leg 08/14/2011  . Cellulitis of right foot 06/13/2016  . Cervicogenic headache 06/14/2015  . Chest  pain 06/08/2011   Atypical chest pain  . Chest pain 07/20/2017  . CHF (congestive heart failure) ([place])   . CHF (congestive heart failure), NYHA class II, chronic, systolic ([place]) 4/0/1027  . Colon polyps    FRAGMENTS OF HYPERPLASTIC POLYP  . CONTACT DERMATITIS 03/10/2009  . COPD (chronic obstructive pulmonary disease) ([place])   . CORONARY ARTERY DISEASE 03/01/2007   had a normal Myoview stress test 07-13-11  . Coronary atherosclerosis 03/01/2007   Qualifier: Diagnosis of  By: Cori Razor RN, Mikal Plane   . CVA (cerebral vascular accident) ([place]) 07/20/2017  . CYSTOCELE WITHOUT MENTION UTERINE PROLAPSE LAT 09/12/2010  . DEGENERATIVE JOINT DISEASE 08/07/2007  . DEPRESSION 03/01/2007  . Depression, recurrent ([place]) 03/01/2007   Qualifier: Diagnosis of  By: Cori Razor RN, Mikal Plane   . DIABETES MELLITUS, TYPE II 08/26/2008  . Diabetic polyneuropathy associated with diabetes mellitus due to underlying condition ([place]) 06/14/2015  . Diarrhea 08/15/2011  . Diverticulosis of colon (without mention of hemorrhage)   . Dizziness 02/10/2014  . DOE (dyspnea on exertion) 11/03/2014  . Edema 09/02/2008   Centricity Description: EDEMA Qualifier: Diagnosis of  By: Niel Hummer MD, Lorinda Creed  Centricity Description: PERIPHERAL EDEMA Qualifier: Diagnosis of  By: Niel Hummer MD, Lorinda Creed   . Esophageal reflux 11/06/2007  . Essential hypertension 03/01/2007   Qualifier: Diagnosis  of  By: Alfonso Ellis, Mikal Plane Gait instability 11/13/2014  . Gout 06/07/2016  . Headache 12/30/2013  . HIP PAIN 09/15/2010   Qualifier: Diagnosis of  By: Elease Hashimoto MD, [place]    . HYPERKERATOSIS 06/02/2009  . HYPERLIPIDEMIA 08/26/2008  . Hyperlipidemia 08/26/2008   Qualifier: Diagnosis of  By: Cori Razor RN, Mikal Plane HYPERTENSION 03/01/2007  . Hypokalemia 08/15/2011  . Insomnia 12/11/2007  . Irritable bowel syndrome 01/26/2009  . LABYRINTHITIS 11/03/2009  . Labyrinthitis 11/03/2009   Qualifier: Diagnosis of  By: Niel Hummer MD, Lorinda Creed   . Leg swelling 04/05/2016  .  Memory loss 12/30/2013  . Narcolepsy without cataplexy 08/26/2008   Qualifier: Diagnosis of  By: Niel Hummer MD, [place] Narcolepsy without cataplexy(347.00) 08/26/2008  . NEPHROLITHIASIS 04/29/2009  . OBESITY 03/01/2007  . Obstructive apnea 11/03/2014  . Osteoarthritis 03/15/2018  . Pain in joint, ankle and foot 12/24/2015  . PERIPHERAL VASCULAR DISEASE 01/26/2009  . Peripheral vascular disease ([place]) 01/26/2009   Qualifier: Diagnosis of  By: Niel Hummer MD, [place] history of colonic polyps 03/09/2014  . Stroke ([place])   . SYNCOPE 08/26/2008   had brain MRI on 06-28-12 showing only chronic microvascular ischemia and atrophy   . SYNDROME, RESTLESS LEGS 03/01/2007   Qualifier: Diagnosis of  By: Cori Razor RN, Mikal Plane Thyroid nodule 04/28/2014  . TIA (transient ischemic attack) 11/13/2014  . TRANSIENT ISCHEMIC ATTACK 10/20/2009   had normal brain MRA with patent vertebrals and carotids 06-28-12  . Uncontrolled type 2 diabetes mellitus with hyperglycemia, with long-term current use of insulin ([place]) 10/09/2016  . Weakness generalized 11/13/2014    Past Surgical History:  Procedure Laterality Date  . ABDOMINAL HYSTERECTOMY    . BACK SURGERY     x2  . CARDIAC CATHETERIZATION  07/2009  . CATARACT EXTRACTION    . COLONOSCOPY  11-13-07   per Dr. Deatra Ina, benign polyps, repeat in 5 yrs  . ESOPHAGOGASTRODUODENOSCOPY  11-13-07   per Dr. Deatra Ina, normal   . INTRAOCULAR LENS INSERTION    . LOOP RECORDER INSERTION N/A 07/23/2017   Procedure: LOOP RECORDER INSERTION;  Surgeon: Evans Lance, MD;  Location: [facility];  Service: Cardiovascular;  Laterality: N/A;  . SPINE SURGERY     x 3  . TONSILECTOMY, ADENOIDECTOMY, BILATERAL MYRINGOTOMY AND TUBES      Family History  Problem Relation Age of Onset  . Lupus Mother   . COPD Father     Social History   Tobacco Use  . Smoking status: Never Smoker  . Smokeless tobacco: Never Used  Substance Use Topics  . Alcohol use: No     Alcohol/week: 0.0 standard drinks  . Drug use: No    Types: Oxycodone      Current Outpatient Medications:  .  clopidogrel (PLAVIX) 75 MG tablet, Take 1 tablet (75 mg total) by mouth daily., Disp: 30 tablet, Rfl: 11 .  Continuous Blood Gluc Receiver (FREESTYLE LIBRE 14 DAY READER) DEVI, 1 Device by Does not apply route daily. Use device daily as directed, Disp: 1 Device, Rfl: 0 .  Continuous Blood Gluc Sensor (FREESTYLE LIBRE 14 DAY SENSOR) MISC, 1 application by Does not apply route every 14 (fourteen) days., Disp: 2 each, Rfl: 6 .  furosemide (LASIX) 40 MG tablet, Take 2 tablets in the morning and 1 tablet in the evening (Patient taking differently: Take 40-80 mg by mouth See admin  instructions. Take 2 tablets by mouth in the morning and 1 tablet in the evening), Disp: 60 tablet, Rfl: 11 .  gabapentin (NEURONTIN) 300 MG capsule, Take 1 capsule (300 mg total) by mouth 3 (three) times daily., Disp: 90 capsule, Rfl: 5 .  glucose blood (ONE TOUCH ULTRA TEST) test strip, 1 each by Other route as needed for other. Use as instructed, Disp: 100 each, Rfl: 1 .  HUMALOG KWIKPEN 100 UNIT/ML KwikPen, Inject 0.2 mLs (20 Units total) into the skin 3 (three) times daily before meals., Disp: 45 mL, Rfl: 5 .  HYDROcodone-homatropine (HYDROMET) 5-1.5 MG/5ML syrup, Take 5 mLs by mouth every 4 (four) hours as needed., Disp: 240 mL, Rfl: 0 .  insulin degludec (TRESIBA FLEXTOUCH) 100 UNIT/ML SOPN FlexTouch Pen, Inject 0.8 mLs (80 Units total) into the skin every morning., Disp: 9 pen, Rfl: 5 .  Insulin Pen Needle (NOVOFINE) 32G X 6 MM MISC, Check glucoses TID, Disp: 90 each, Rfl: 11 .  levofloxacin (LEVAQUIN) 500 MG tablet, Take 1 tablet (500 mg total) by mouth daily., Disp: 10 tablet, Rfl: 0 .  meclizine (ANTIVERT) 25 MG tablet, Take 1 tablet (25 mg total) by mouth 3 (three) times daily as needed for dizziness., Disp: 60 tablet, Rfl: 5 .  nitroGLYCERIN (NITROSTAT) 0.4 MG SL tablet, Place 0.4 mg under the tongue  every 5 (five) minutes as needed for chest pain., Disp: , Rfl:  .  oxyCODONE-acetaminophen (PERCOCET) 10-325 MG tablet, Take 1 tablet by mouth every 4 (four) hours as needed for pain (for rib  injury)., Disp: 30 tablet, Rfl: 0 .  pramipexole (MIRAPEX) 1 MG tablet, TAKE 4 TABLETS(4 MG) BY MOUTH DAILY (Patient taking differently: Take 4 mg by mouth daily. ), Disp: 368 tablet, Rfl: 1 .  promethazine (PHENERGAN) 25 MG tablet, Take 1 tablet (25 mg total) by mouth every 4 (four) hours as needed for nausea or vomiting., Disp: 60 tablet, Rfl: 5 .  spironolactone (ALDACTONE) 25 MG tablet, Take 1 tablet (25 mg total) by mouth 2 (two) times daily., Disp: 90 tablet, Rfl: 1 .  terbinafine (LAMISIL) 250 MG tablet, Take 1 tablet (250 mg total) by mouth daily., Disp: 90 tablet, Rfl: 1 .  zolpidem (AMBIEN) 10 MG tablet, Take 1 tablet (10 mg total) by mouth at bedtime as needed for up to 30 days for sleep., Disp: 30 tablet, Rfl: 5  EXAM: BP Readings from Last 3 Encounters:  11/21/18 (!) 118/57  11/15/18 134/72  11/01/18 128/70    VITALS per patient if applicable:  GENERAL: alert, oriented, appears well and in mild pain sistting in care with son for viist and camera    HEENT: atraumatic, conjunttiva clear, no obvious abnormalities on inspection of external nose and ears  NECK: normal movements of the head and neck  LUNGS: on inspection no signs of respiratory distress, breathing rate appears normal, no obvious gross SOB, gasping or wheezing  CV: no obvious cyanosis  MS: left hand wrist with some ? Min swelling  At base of thumb vs arthris  Pain with flexion extensiont no bruising   Can move fingers  Pain near base of thumb?    PSYCH/NEURO: pleasant and cooperative, no obvious depression or anxiety, speech and thought processing grossly intact but appears in some pain  Lab Results  Component Value Date   WBC 11.8 (H) 11/21/2018   HGB 13.6 11/21/2018   HCT 40.8 11/21/2018   PLT 235 11/21/2018    GLUCOSE 312 (H) 11/21/2018   CHOL  125 12/11/2017   TRIG 98 12/11/2017   HDL 49 12/11/2017   LDLCALC 56 12/11/2017   ALT 16 11/21/2018   AST 20 11/21/2018   NA 135 11/21/2018   K 4.2 11/21/2018   CL 97 (L) 11/21/2018   CREATININE 1.01 (H) 11/21/2018   BUN 32 (H) 11/21/2018   CO2 28 11/21/2018   TSH 1.560 09/13/2018   INR 1.04 11/21/2018   HGBA1C 10.3 (H) 05/24/2018   MICROALBUR 1.13 03/30/2017    ASSESSMENT AND PLAN:  Discussed the following assessment and plan:  Fall, initial encounter - Plan: DG Wrist Complete Left, DG Chest 2 View, DG Ribs Unilateral Left, DG Ribs Unilateral Left, DG Chest 2 View, DG Wrist Complete Left  Rib pain on left side - Plan: DG Wrist Complete Left, DG Chest 2 View, DG Ribs Unilateral Left, DG Ribs Unilateral Left, DG Chest 2 View, DG Wrist Complete Left  Wrist injury, left, initial encounter - Plan: DG Wrist Complete Left, DG Chest 2 View, DG Ribs Unilateral Left, DG Ribs Unilateral Left, DG Chest 2 View, DG Wrist Complete Left  Medication management Advise x ray of wrist   And will get rib x ray also .    Today  ( neg covid ?s)  And  Send in refill oxycodone with caution     Xray stat read . Today    No fx poss e mild Ie dema on chest and healing left 10 rib fx   Fu dr Sarajane Jews refill pain med  Fu with alarm sx or with 2 weeks dr Sarajane Jews Counseled.   Expectant management and discussion of plan and treatment with opportunity to ask questions and all were answered. The patient agreed with the plan and demonstrated an understanding of the instructions.   Make plan after x ray back  And fu with PCP     Shanon Ace, MD

## 2019-02-25 ENCOUNTER — Telehealth: Payer: Self-pay

## 2019-02-25 NOTE — Telephone Encounter (Signed)
Copied from [place] [id number]. Topic: General - Inquiry >> Feb 25, 2019 12:10 PM Virl Axe D wrote: Reason for CRM: Pt called to follow up on xray results from 02/21/19. Would like a callback with results. Please advise. CB#[phone]

## 2019-02-27 ENCOUNTER — Ambulatory Visit (INDEPENDENT_AMBULATORY_CARE_PROVIDER_SITE_OTHER): Payer: Medicare Other | Admitting: *Deleted

## 2019-02-27 DIAGNOSIS — I6340 Cerebral infarction due to embolism of unspecified cerebral artery: Secondary | ICD-10-CM | POA: Diagnosis not present

## 2019-02-27 LAB — CUP PACEART REMOTE DEVICE CHECK
Date Time Interrogation Session: 20200528041226
Implantable Pulse Generator Implant Date: 20181022

## 2019-02-28 ENCOUNTER — Telehealth: Payer: Self-pay | Admitting: Family Medicine

## 2019-02-28 NOTE — Telephone Encounter (Signed)
Called and spoke with betsy from [employer] and the verbal order was given for the PT

## 2019-02-28 NOTE — Telephone Encounter (Signed)
Copied from [place] [id number]. Topic: Quick Communication - Home Health Verbal Orders >> Feb 28, 2019  8:22 AM Lennox Solders wrote: Caller/Agency: betsy PT [facility] Callback Number [phone] Requesting PT 2 x 3 and then 1x1

## 2019-03-06 NOTE — Progress Notes (Signed)
[facility] Summary Report / Loop Recorder 

## 2019-03-06 NOTE — Telephone Encounter (Signed)
These forms have been completed and sent back

## 2019-03-12 ENCOUNTER — Ambulatory Visit: Payer: Medicare Other | Admitting: Family Medicine

## 2019-03-12 ENCOUNTER — Emergency Department (HOSPITAL_COMMUNITY)
Admission: EM | Admit: 2019-03-12 | Discharge: 2019-03-12 | Disposition: A | Discharge status: Home / Self Care | Payer: Medicare Other | Attending: Emergency Medicine | Admitting: Emergency Medicine

## 2019-03-12 ENCOUNTER — Telehealth: Payer: Self-pay

## 2019-03-12 ENCOUNTER — Encounter: Payer: Self-pay | Admitting: Family Medicine

## 2019-03-12 ENCOUNTER — Encounter (HOSPITAL_COMMUNITY): Payer: Self-pay | Admitting: Emergency Medicine

## 2019-03-12 ENCOUNTER — Emergency Department (HOSPITAL_COMMUNITY): Payer: Medicare Other

## 2019-03-12 ENCOUNTER — Other Ambulatory Visit: Payer: Self-pay

## 2019-03-12 ENCOUNTER — Telehealth: Payer: Self-pay | Admitting: Family Medicine

## 2019-03-12 DIAGNOSIS — Z8673 Personal history of transient ischemic attack (TIA), and cerebral infarction without residual deficits: Secondary | ICD-10-CM | POA: Diagnosis not present

## 2019-03-12 DIAGNOSIS — R600 Localized edema: Secondary | ICD-10-CM | POA: Diagnosis not present

## 2019-03-12 DIAGNOSIS — I5033 Acute on chronic diastolic (congestive) heart failure: Secondary | ICD-10-CM | POA: Diagnosis not present

## 2019-03-12 DIAGNOSIS — Z7902 Long term (current) use of antithrombotics/antiplatelets: Secondary | ICD-10-CM | POA: Insufficient documentation

## 2019-03-12 DIAGNOSIS — R2243 Localized swelling, mass and lump, lower limb, bilateral: Secondary | ICD-10-CM | POA: Diagnosis present

## 2019-03-12 DIAGNOSIS — J449 Chronic obstructive pulmonary disease, unspecified: Secondary | ICD-10-CM | POA: Diagnosis not present

## 2019-03-12 DIAGNOSIS — E1143 Type 2 diabetes mellitus with diabetic autonomic (poly)neuropathy: Secondary | ICD-10-CM | POA: Insufficient documentation

## 2019-03-12 DIAGNOSIS — Z880 Allergy status to penicillin: Secondary | ICD-10-CM | POA: Insufficient documentation

## 2019-03-12 DIAGNOSIS — Z79899 Other long term (current) drug therapy: Secondary | ICD-10-CM | POA: Diagnosis not present

## 2019-03-12 DIAGNOSIS — I2510 Atherosclerotic heart disease of native coronary artery without angina pectoris: Secondary | ICD-10-CM | POA: Diagnosis not present

## 2019-03-12 DIAGNOSIS — I509 Heart failure, unspecified: Secondary | ICD-10-CM

## 2019-03-12 LAB — CBC WITH DIFFERENTIAL/PLATELET
Abs Immature Granulocytes: 0.02 10*3/uL (ref 0.00–0.07)
Basophils Absolute: 0 10*3/uL (ref 0.0–0.1)
Basophils Relative: 1 %
Eosinophils Absolute: 0.2 10*3/uL (ref 0.0–0.5)
Eosinophils Relative: 2 %
HCT: 37.7 % (ref 36.0–46.0)
Hemoglobin: 12 g/dL (ref 12.0–15.0)
Immature Granulocytes: 0 %
Lymphocytes Relative: 30 %
Lymphs Abs: 2.4 10*3/uL (ref 0.7–4.0)
MCH: 29.2 pg (ref 26.0–34.0)
MCHC: 31.8 g/dL (ref 30.0–36.0)
MCV: 91.7 fL (ref 80.0–100.0)
Monocytes Absolute: 0.5 10*3/uL (ref 0.1–1.0)
Monocytes Relative: 6 %
Neutro Abs: 4.8 10*3/uL (ref 1.7–7.7)
Neutrophils Relative %: 61 %
Platelets: 207 10*3/uL (ref 150–400)
RBC: 4.11 MIL/uL (ref 3.87–5.11)
RDW: 13.4 % (ref 11.5–15.5)
WBC: 7.8 10*3/uL (ref 4.0–10.5)
nRBC: 0 % (ref 0.0–0.2)

## 2019-03-12 LAB — BASIC METABOLIC PANEL
Anion gap: 7 (ref 5–15)
BUN: 16 mg/dL (ref 8–23)
CO2: 26 mmol/L (ref 22–32)
Calcium: 9 mg/dL (ref 8.9–10.3)
Chloride: 107 mmol/L (ref 98–111)
Creatinine, Ser: 0.81 mg/dL (ref 0.44–1.00)
GFR calc Af Amer: >60 mL/min (ref >60)
GFR calc non Af Amer: >60 mL/min (ref >60)
Glucose, Bld: 162 mg/dL — ABNORMAL HIGH (ref 70–99)
Potassium: 4 mmol/L (ref 3.5–5.1)
Sodium: 140 mmol/L (ref 135–145)

## 2019-03-12 LAB — BRAIN NATRIURETIC PEPTIDE: B Natriuretic Peptide: 264 pg/mL — ABNORMAL HIGH (ref 0.0–100.0)

## 2019-03-12 LAB — TROPONIN I: Troponin I: <0.03 ng/mL (ref <0.03)

## 2019-03-12 MED ORDER — FUROSEMIDE 10 MG/ML IJ SOLN
60.0000 mg | Freq: Once | INTRAMUSCULAR | Status: AC
  Administered 2019-03-12: 60 mg via INTRAVENOUS
  Filled 2019-03-12: qty 6

## 2019-03-12 NOTE — ED Triage Notes (Signed)
Pt BIB [facility] from home, home health nurse concerned because pt was hypertensive and has had increased BLE swelling. Pt reports that she hasn't been able to take her fluid pill in the last 2 days. Denies chest pain, states shortness of breath is no worse than her normal. Pt also c/o lower back pain, states she was in an accident in her golf cart x 3 weeks ago, saw PCP and had xrays done.

## 2019-03-12 NOTE — Telephone Encounter (Signed)
error 

## 2019-03-12 NOTE — Progress Notes (Signed)
   Subjective:    Patient ID: Melanie Mcdonald, female    DOB: 20-Jul-1933, 85 y.o.   MRN: [id number]  HPI Virtual Visit via Telephone Note  I connected with the patient on 03/12/19 at  1:15 PM EDT by telephone and verified that I am speaking with the correct person using two identifiers.   I discussed the limitations, risks, security and privacy concerns of performing an evaluation and management service by telephone and the availability of in person appointments. I also discussed with the patient that there may be a patient responsible charge related to this service. The patient expressed understanding and agreed to proceed.  Location patient: home Location provider: work or home office Participants present for the call: patient, provider Patient did not have a visit in the prior 7 days to address this/these issue(s).   History of Present Illness: She was scheduled to have a telphone visit with me today to discuss back pain and leg pain, but when I called the medical social worker answered. She said Melanie Mcdonald had told her that she had an episode of chest pain yesterday, and that ever since she has been SOB. Her legs have been swelling and she has gained 8 lbs in 24 hours. When the social worker arrived at the house she checked vital signs and the BP was 170/78 and her O2 sats on RA were in the low 80s. She had already called EMS and they were loading Melanie Mcdonald on their gurney as we spoke. They will be taking her to the ER.    Observations/Objective: Patient sounds cheerful and well on the phone. I do not appreciate any SOB. Speech and thought processing are grossly intact. Patient reported vitals:  Assessment and Plan: She is going to the ER via EMS for a likely acute exacerbation of CHF.   Follow Up Instructions:     [id number] [id number] [id number] 11-20 [id number] 21-30 I did not refer this patient for an OV in the next 24 hours for this/these issue(s).  I discussed the assessment and treatment plan  with the patient. The patient was provided an opportunity to ask questions and all were answered. The patient agreed with the plan and demonstrated an understanding of the instructions.   The patient was advised to call back or seek an in-person evaluation if the symptoms worsen or if the condition fails to improve as anticipated.  I provided 38minutes of non-face-to-face time during this encounter.   Alysia Penna, MD   Review of Systems     Objective:   Physical Exam        Assessment & Plan:

## 2019-03-12 NOTE — Discharge Instructions (Addendum)
Please make sure you take all of your doses of fluid medications. If you skip your doses you will have worse fluid build up and may end up in the hospital. Elevate your legs up when you are not walking to help with the swelling. Thank you for allowing me to care for you today. Please return to the emergency department if you have new or worsening symptoms. Take your medications as instructed.

## 2019-03-12 NOTE — ED Notes (Signed)
Nurse Navigator communication: Pts son to be notified of the plan of care thus far. She reports telling him that she was coming.   A note will be sent to the provider as the pt requests to have a wrist splint.

## 2019-03-12 NOTE — ED Notes (Signed)
Pt given discharge instructions and follow up instructions. Pt given the opportunity to ask questions.

## 2019-03-12 NOTE — Telephone Encounter (Signed)
Copied from [place] [phone]. Topic: Quick Communication - Home Health Verbal Orders >> Mar 12, 2019  8:02 AM Ivar Drape wrote: Caller/Agency:   Charmayne Sheer w/[facility] Number:   [phone] Requesting OT/PT/Skilled Nursing/Social Work/Speech Therapy:  Needs a verbal order to go out to see the patient today.  Patient is saying she has difficulty walking and severe pain in back and down both of her legs.  Millie thinks the patient should probably go to the hospital.

## 2019-03-12 NOTE — ED Provider Notes (Signed)
[facility] EMERGENCY DEPARTMENT Provider Note   CSN: [id number] Arrival date & time: 03/12/19  1528    History   Chief Complaint Chief Complaint  Patient presents with   Leg Swelling   Back Pain    HPI Melanie Mcdonald is a 85 y.o. female.     Patient is a 85 year old female with past medical history of COPD, CHF, diabetes who presents the emergency department for increased bilateral lower extremity leg swelling.  Patient reports that her home health aide who has not seen her in some time, came to her house today and realized her leg swelling looked worse than normal and thought she should come to be evaluated in the emergency department.  Patient admits that she has been skipping some of her Lasix and spironolactone medication doses due to being busy with doctor's office visits.  She reports that her shortness of breath is at her baseline which is worse with exertion and going up stairs.  She reports that yesterday she had some nonexertional chest pain while sitting in her golf cart for about 3 to 4 minutes.  Denies any current chest pain or shortness of breath.     Past Medical History:  Diagnosis Date   Abdominal pain, unspecified site 03/09/2014   Acute on chronic diastolic CHF (congestive heart failure), NYHA class 3 ([place]) 11/03/2014   Acute on chronic diastolic heart failure   Acute respiratory failure ([place]) 06/17/2018   ANEMIA 01/30/2008   ARTHRITIS 12/11/2007   BACK PAIN, CHRONIC 06/23/2008   Backache 06/23/2008   Qualifier: Diagnosis of  By: Niel Hummer MD, Willie R    BREAST CYST, RIGHT 12/17/2007   Cancer ([place])    Candidiasis of genitalia in female 07/20/2017   Cellulitis of left leg 08/14/2011   Cellulitis of right foot 06/13/2016   Cervicogenic headache 06/14/2015   Chest pain 06/08/2011   Atypical chest pain   Chest pain 07/20/2017   CHF (congestive heart failure) (HCC)    CHF (congestive heart failure), NYHA class II, chronic,  systolic ([place]) 05/02/8562   Colon polyps    FRAGMENTS OF HYPERPLASTIC POLYP   CONTACT DERMATITIS 03/10/2009   COPD (chronic obstructive pulmonary disease) ([place])    CORONARY ARTERY DISEASE 03/01/2007   had a normal Myoview stress test 07-13-11   Coronary atherosclerosis 03/01/2007   Qualifier: Diagnosis of  By: Cori Razor RN, Mikal Plane    CVA (cerebral vascular accident) ([place]) 07/20/2017   CYSTOCELE WITHOUT MENTION UTERINE PROLAPSE LAT 09/12/2010   DEGENERATIVE JOINT DISEASE 08/07/2007   DEPRESSION 03/01/2007   Depression, recurrent ([place]) 03/01/2007   Qualifier: Diagnosis of  By: Cori Razor RN, Regina G    DIABETES MELLITUS, TYPE II 08/26/2008   Diabetic polyneuropathy associated with diabetes mellitus due to underlying condition ([place]) 06/14/2015   Diarrhea 08/15/2011   Diverticulosis of colon (without mention of hemorrhage)    Dizziness 02/10/2014   DOE (dyspnea on exertion) 11/03/2014   Edema 09/02/2008   Centricity Description: EDEMA Qualifier: Diagnosis of  By: Niel Hummer MD, Lorinda Creed  Centricity Description: PERIPHERAL EDEMA Qualifier: Diagnosis of  By: Niel Hummer MD, [place] R    Esophageal reflux 11/06/2007   Essential hypertension 03/01/2007   Qualifier: Diagnosis of  By: Aurther Loft    Gait instability 11/13/2014   Gout 06/07/2016   Headache 12/30/2013   HIP PAIN 09/15/2010   Qualifier: Diagnosis of  By: Elease Hashimoto MD, Nada Boozer 06/02/2009   HYPERLIPIDEMIA 08/26/2008  Hyperlipidemia 08/26/2008   Qualifier: Diagnosis of  By: Cori Razor RN, Mikal Plane    HYPERTENSION 03/01/2007   Hypokalemia 08/15/2011   Insomnia 12/11/2007   Irritable bowel syndrome 01/26/2009   LABYRINTHITIS 11/03/2009   Labyrinthitis 11/03/2009   Qualifier: Diagnosis of  By: Niel Hummer MD, Willie R    Leg swelling 04/05/2016   Memory loss 12/30/2013   Narcolepsy without cataplexy 08/26/2008   Qualifier: Diagnosis of  By: Niel Hummer MD, Willie R    Narcolepsy without cataplexy(347.00)  08/26/2008   NEPHROLITHIASIS 04/29/2009   OBESITY 03/01/2007   Obstructive apnea 11/03/2014   Osteoarthritis 03/15/2018   Pain in joint, ankle and foot 12/24/2015   PERIPHERAL VASCULAR DISEASE 01/26/2009   Peripheral vascular disease ([place]) 01/26/2009   Qualifier: Diagnosis of  By: Niel Hummer MD, Lorinda Creed    Personal history of colonic polyps 03/09/2014   Stroke ([place])    SYNCOPE 08/26/2008   had brain MRI on 06-28-12 showing only chronic microvascular ischemia and atrophy    SYNDROME, RESTLESS LEGS 03/01/2007   Qualifier: Diagnosis of  By: Cori Razor RN, Mikal Plane    Thyroid nodule 04/28/2014   TIA (transient ischemic attack) 11/13/2014   TRANSIENT ISCHEMIC ATTACK 10/20/2009   had normal brain MRA with patent vertebrals and carotids 06-28-12   Uncontrolled type 2 diabetes mellitus with hyperglycemia, with long-term current use of insulin ([place]) 10/09/2016   Weakness generalized 11/13/2014    Patient Active Problem List   Diagnosis Date Noted   Trigger little finger of left hand 11/20/2018   Osteoarthritis 03/15/2018   CHF (congestive heart failure), NYHA class II, chronic, systolic ([place]) 78/58/8502   CVA (cerebral vascular accident) ([place]) 07/20/2017   Candidiasis of genitalia in female 07/20/2017   Uncontrolled type 2 diabetes mellitus with hyperglycemia, with long-term current use of insulin ([place]) 10/09/2016   Gout 06/07/2016   Leg swelling 04/05/2016   Pain in joint, ankle and foot 12/24/2015   Diabetic polyneuropathy associated with diabetes mellitus due to underlying condition ([place]) 06/14/2015   Cervicogenic headache 06/14/2015   Weakness generalized 11/13/2014   TIA (transient ischemic attack) 11/13/2014   Gait instability 11/13/2014   Acute on chronic diastolic CHF (congestive heart failure), NYHA class 3 ([place]) 11/03/2014   Obstructive apnea 11/03/2014   DOE (dyspnea on exertion) 11/03/2014   Thyroid nodule 04/28/2014   Personal history of colonic polyps  03/09/2014   Dizziness 02/10/2014   Memory loss 12/30/2013   Headache 12/30/2013   Hypokalemia 08/15/2011   Diarrhea 08/15/2011   Chest pain 06/08/2011   HIP PAIN 09/15/2010   CYSTOCELE WITHOUT MENTION UTERINE PROLAPSE LAT 09/12/2010   Labyrinthitis 11/03/2009   NEPHROLITHIASIS 04/29/2009   Peripheral vascular disease ([place]) 01/26/2009   IRRITABLE BOWEL SYNDROME 01/26/2009   Edema 09/02/2008   Hyperlipidemia 08/26/2008   Narcolepsy without cataplexy 08/26/2008   Backache 06/23/2008   Insomnia 12/11/2007   Esophageal reflux 11/06/2007   OBESITY 03/01/2007   Depression, recurrent ([place]) 03/01/2007   SYNDROME, RESTLESS LEGS 03/01/2007   Essential hypertension 03/01/2007   Coronary atherosclerosis 03/01/2007    Past Surgical History:  Procedure Laterality Date   ABDOMINAL HYSTERECTOMY     BACK SURGERY     x2   CARDIAC CATHETERIZATION  07/2009   CATARACT EXTRACTION     COLONOSCOPY  11-13-07   per Dr. Deatra Ina, benign polyps, repeat in 5 yrs   ESOPHAGOGASTRODUODENOSCOPY  11-13-07   per Dr. Deatra Ina, normal    INTRAOCULAR [place]  N/A 07/23/2017   Procedure: LOOP RECORDER INSERTION;  Surgeon: Evans Lance, MD;  Location: [facility];  Service: Cardiovascular;  Laterality: N/A;   SPINE SURGERY     x 3   TONSILECTOMY, ADENOIDECTOMY, BILATERAL MYRINGOTOMY AND TUBES       OB History   No obstetric history on file.      Home Medications    Prior to Admission medications   Medication Sig Start Date End Date Taking? Authorizing Provider  clopidogrel (PLAVIX) 75 MG tablet Take 1 tablet (75 mg total) by mouth daily. 05/06/18   Cameron Sprang, MD  Continuous Blood Gluc Receiver (FREESTYLE LIBRE 14 DAY READER) DEVI 1 Device by Does not apply route daily. Use device daily as directed 12/04/18   Laurey Morale, MD  Continuous Blood Gluc Sensor (FREESTYLE LIBRE 14 DAY SENSOR) MISC 1 application by Does not apply  route every 14 (fourteen) days. 12/09/18   Laurey Morale, MD  furosemide (LASIX) 40 MG tablet Take 2 tablets in the morning and 1 tablet in the evening Patient taking differently: Take 40-80 mg by mouth See admin instructions. Take 2 tablets by mouth in the morning and 1 tablet in the evening 11/15/18   Laurey Morale, MD  gabapentin (NEURONTIN) 300 MG capsule Take 1 capsule (300 mg total) by mouth 3 (three) times daily. 12/10/18   Laurey Morale, MD  glucose blood (ONE TOUCH ULTRA TEST) test strip 1 each by Other route as needed for other. Use as instructed 06/21/18   Laurey Morale, MD  HUMALOG KWIKPEN 100 UNIT/ML KwikPen Inject 0.2 mLs (20 Units total) into the skin 3 (three) times daily before meals. 11/01/18   Laurey Morale, MD  HYDROcodone-homatropine (HYDROMET) 5-1.5 MG/5ML syrup Take 5 mLs by mouth every 4 (four) hours as needed. 12/26/18   Laurey Morale, MD  insulin degludec (TRESIBA FLEXTOUCH) 100 UNIT/ML SOPN FlexTouch Pen Inject 0.8 mLs (80 Units total) into the skin every morning. 12/09/18   Laurey Morale, MD  Insulin Pen Needle (NOVOFINE) 32G X 6 MM MISC Check glucoses TID 06/21/18   Laurey Morale, MD  levofloxacin (LEVAQUIN) 500 MG tablet Take 1 tablet (500 mg total) by mouth daily. 01/02/19   Laurey Morale, MD  meclizine (ANTIVERT) 25 MG tablet Take 1 tablet (25 mg total) by mouth 3 (three) times daily as needed for dizziness. 12/26/18   Laurey Morale, MD  nitroGLYCERIN (NITROSTAT) 0.4 MG SL tablet Place 0.4 mg under the tongue every 5 (five) minutes as needed for chest pain.    [provider]  oxyCODONE-acetaminophen (PERCOCET) 10-325 MG tablet Take 1 tablet by mouth every 4 (four) hours as needed for pain (for rib  injury). 02/21/19   Panosh, Standley Brooking, MD  pramipexole (MIRAPEX) 1 MG tablet TAKE 4 TABLETS(4 MG) BY MOUTH DAILY Patient taking differently: Take 4 mg by mouth daily.  10/09/18   Laurey Morale, MD  promethazine (PHENERGAN) 25 MG tablet Take 1 tablet (25 mg total) by mouth  every 4 (four) hours as needed for nausea or vomiting. 12/26/18   Laurey Morale, MD  spironolactone (ALDACTONE) 25 MG tablet Take 1 tablet (25 mg total) by mouth 2 (two) times daily. 01/20/19   Laurey Morale, MD  terbinafine (LAMISIL) 250 MG tablet Take 1 tablet (250 mg total) by mouth daily. 06/07/18   Laurey Morale, MD  zolpidem (AMBIEN) 10 MG tablet Take 1 tablet (10 mg total)  by mouth at bedtime as needed for up to 30 days for sleep. 11/01/18 12/01/18  Laurey Morale, MD    Family History Family History  Problem Relation Age of Onset   Lupus Mother    COPD Father     Social History Social History   Tobacco Use   Smoking status: Never Smoker   Smokeless tobacco: Never Used  Substance Use Topics   Alcohol use: No    Alcohol/week: 0.0 standard drinks   Drug use: No    Types: Oxycodone     Allergies   Lyrica [pregabalin]; Aspirin; Ciprofloxacin; Clarithromycin; Doxycycline; Erythromycin; Lisinopril; Macrobid [nitrofurantoin monohyd macro]; Metolazone; Other; Penicillins; Sulfonamide derivatives; and Valtrex [valacyclovir hcl]   Review of Systems Review of Systems  Constitutional: Negative for appetite change and fever.  HENT: Negative for congestion and rhinorrhea.   Respiratory: Positive for shortness of breath. Negative for cough.   Cardiovascular: Positive for leg swelling. Negative for chest pain and palpitations.  Gastrointestinal: Negative for nausea and vomiting.  Genitourinary: Negative for dysuria.  Musculoskeletal: Positive for back pain.  Skin: Negative for rash and wound.  Neurological: Negative for dizziness and light-headedness.  Hematological: Does not bruise/bleed easily.     Physical Exam Updated Vital Signs BP (!) 162/84    Pulse 89    Temp 98.8 F (37.1 C)    Resp 11    Ht 5\' 6"  (1.676 m)    Wt 108.9 kg    SpO2 96%    BMI 38.74 kg/m   Physical Exam Vitals signs and nursing note reviewed.  Constitutional:      General: She is not in acute  distress.    Appearance: Normal appearance. She is not ill-appearing, toxic-appearing or diaphoretic.     Comments: Pleasant, elderly female in no distress  HENT:     Head: Normocephalic.  Eyes:     Conjunctiva/sclera: Conjunctivae normal.  Cardiovascular:     Rate and Rhythm: Regular rhythm. Tachycardia present.  Pulmonary:     Effort: Pulmonary effort is normal.     Breath sounds: Normal breath sounds. No wheezing or rales.  Abdominal:     General: Abdomen is flat. Bowel sounds are normal.  Musculoskeletal:     Right lower leg: Edema present.     Left lower leg: Edema present.     Comments: Bilateral 3+ pitting edema to the knees.  Bilateral lower extremities with venous stasis dermatitis  Skin:    General: Skin is dry.  Neurological:     Mental Status: She is alert.  Psychiatric:        Mood and Affect: Mood normal.      ED Treatments / Results  Labs (all labs ordered are listed, but only abnormal results are displayed) Labs Reviewed  BASIC METABOLIC PANEL - Abnormal; Notable for the following components:      Result Value   Glucose, Bld 162 (*)    All other components within normal limits  BRAIN NATRIURETIC PEPTIDE - Abnormal; Notable for the following components:   B Natriuretic Peptide 264.0 (*)    All other components within normal limits  CBC WITH DIFFERENTIAL/PLATELET  TROPONIN I  CBC    EKG EKG Interpretation  Date/Time:  Wednesday March 12 2019 16:12:36 EDT Ventricular Rate:  77 PR Interval:    QRS Duration: 91 QT Interval:  407 QTC Calculation: 461 R Axis:   42 Text Interpretation:  Sinus rhythm No acute changes No significant change since last tracing Confirmed by Kathrynn Humble,  Ankit [phone]) on 03/12/2019 6:15:03 PM   Radiology Dg Chest Port 1 View  Result Date: 03/12/2019 CLINICAL DATA:  Lower extremity swelling, clinical concern for CHF EXAM: PORTABLE CHEST 1 VIEW COMPARISON:  02/21/2011 chest radiograph. FINDINGS: loop recorder overlies the lower  left chest. Stable cardiomediastinal silhouette with borderline mild cardiomegaly. No pneumothorax. No pleural effusion. Borderline mild pulmonary edema. IMPRESSION: Borderline mild congestive heart failure. Electronically Signed   By: Ilona Sorrel M.D.   On: 03/12/2019 16:22    Procedures Procedures (including critical care time)  Medications Ordered in ED Medications  furosemide (LASIX) injection 60 mg (has no administration in time range)     Initial Impression / Assessment and Plan / ED Course  I have reviewed the triage vital signs and the nursing notes.  Pertinent labs & imaging results that were available during my care of the patient were reviewed by me and considered in my medical decision making (see chart for details).  Clinical Course as of Mar 11 1817  Wed Jun 10, 817  [id number] 85 year old female sent to the emergency department by her home health aide who thought her lower extremity swelling was getting worse and.  Patient reports it has been worse in the last couple of days due to forgetting to take some of her medication doses.  Lung sounds are clear.  Significant edema in bilateral lower extremities.  Will work-up for CHF exacerbation.   [KM]  1750 Patient's findings are consistent with mild CHF exacerbation with a bnp of 264 and chest xray showing mild edema. Patient has normal oxygen sats on room air with normal trop and ne concerning findings on eks. Will give dose of IV lasix and send home with instructions to take medication as prescribed without missing doses.    [KM]    Clinical Course User Index [KM] Alveria Apley, PA-C         Final Clinical Impressions(s) / ED Diagnoses   Final diagnoses:  Acute on chronic congestive heart failure, unspecified heart failure type [facility])    ED Discharge Orders    None       Kristine Royal 03/12/19 1818    Varney Biles, MD 03/12/19 1921

## 2019-03-12 NOTE — Telephone Encounter (Signed)
I have called and lmom for Millie to give her the order to see the pt today.

## 2019-03-12 NOTE — Progress Notes (Signed)
Orthopedic Tech Progress Note Patient Details:  Melanie Mcdonald [birth year] [id number]  Ortho Devices Type of Ortho Device: Thumb velcro splint Ortho Device/Splint Location: ULE Ortho Device/Splint Interventions: Adjustment, Application, Ordered   Post Interventions Patient Tolerated: Well Instructions Provided: Care of device, Adjustment of device   Janit Pagan 03/12/2019, 6:49 PM

## 2019-03-14 ENCOUNTER — Other Ambulatory Visit: Payer: Self-pay

## 2019-03-14 ENCOUNTER — Encounter: Payer: Self-pay | Admitting: Family Medicine

## 2019-03-14 ENCOUNTER — Ambulatory Visit: Payer: Medicare Other | Admitting: Family Medicine

## 2019-03-14 ENCOUNTER — Telehealth: Payer: Self-pay | Admitting: *Deleted

## 2019-03-14 VITALS — BP 128/60 | HR 83 | Temp 98.1°F | Wt 246.0 lb

## 2019-03-14 DIAGNOSIS — M5441 Lumbago with sciatica, right side: Secondary | ICD-10-CM | POA: Diagnosis not present

## 2019-03-14 DIAGNOSIS — M5442 Lumbago with sciatica, left side: Secondary | ICD-10-CM | POA: Diagnosis not present

## 2019-03-14 DIAGNOSIS — I5033 Acute on chronic diastolic (congestive) heart failure: Secondary | ICD-10-CM

## 2019-03-14 MED ORDER — OXYCODONE-ACETAMINOPHEN 10-325 MG PO TABS: 1.0000 | ORAL_TABLET | ORAL | 0 refills | Status: DC | PRN

## 2019-03-14 NOTE — Progress Notes (Signed)
   Subjective:    Patient ID: Melanie Mcdonald, female    DOB: Sep 10, 1933, 85 y.o.   MRN: [id number]  HPI Here to follow up an ER visit on 03-12-19 and to discuss severe low back pain. Prior to the ER visit she was noted by her social worker to have increased swelling in both legs and weight gain. She denied any chest pain or any more SOB above her baseline. She has CHF and she is prone to having acute exacerbations, especially when she doesn't take her diuretics as prescribed. She admitted to not taking the Lasix and Spironolactone as prescribed for several weeks because she had numerous doctor appointments. At the ER her CXR showed mild pulmonary edema. Her BNP was 264, the creatinine was 0.81. She was treated with IV Lasix and she recovered nicely. She was sent home on her regular medications. Since then the leg swelling has been stable and she denies SOB. Her main complaint to day os severe low back pain that radiates down both posterior legs to the feet. She also has some numbness and tingling and weakness in both legs. She has known degenerative disc diease and she is S/P 2 spinal surgeries from over 20 years ago. About 3 weeks ago while out on her property she fell off her golf cart and injured her lower back. She has had this back pain ever since. Percocet makes it tolerable temporarily.   Review of Systems  Respiratory: Negative.   Cardiovascular: Positive for leg swelling. Negative for chest pain and palpitations.  Musculoskeletal: Positive for back pain.       Objective:   Physical Exam Constitutional:      Comments: In a wheelchair, in obvious pain   Cardiovascular:     Rate and Rhythm: Normal rate and regular rhythm.     Pulses: Normal pulses.     Heart sounds: Normal heart sounds.  Pulmonary:     Effort: Pulmonary effort is normal.     Breath sounds: Normal breath sounds.  Musculoskeletal:     Comments: Very tender over the lower spine and both sciatic notches. ROM is very  limited by pain. Positive SLR bilaterally  Neurological:     Mental Status: She is alert.           Assessment & Plan:  Low back pain with sciatica. Use Percocet prn pain. Set up a contrasted lumbar MRI. Her recent exacerbation of CHF has resolved.  Alysia Penna, MD

## 2019-03-14 NOTE — Telephone Encounter (Signed)
error 

## 2019-03-18 ENCOUNTER — Other Ambulatory Visit: Payer: Self-pay | Admitting: Dermatology

## 2019-03-19 ENCOUNTER — Telehealth: Payer: Self-pay

## 2019-03-19 NOTE — Telephone Encounter (Signed)
Dr. Fry please advise. Thanks  

## 2019-03-19 NOTE — Telephone Encounter (Signed)
I agree. Call home health and ask them to check her O2 sats at rest and also after walking 25 feet. Then report back to [place]

## 2019-03-19 NOTE — Telephone Encounter (Signed)
Copied from [place] [id number]. Topic: General - Other >> Mar 19, 2019 10:16 AM Rainey Pines A wrote: Lattie Haw RN from [facility], also patients case manager contacted office out of concern for patient . Lattie Haw stated that she was concerned that [place] is ready to release patient because she feels that home health is still necessary. Lattie Haw stated that patient has lost 8lbs in a matter of a few days since her emergency room visit on 03/12/2019. Lattie Haw stated that patient is very weak, tired, and short of breath. Lattie Haw recommends patient get oxygen because of her recent oxygen levels in the upper 80s. Lattie Haw recommends maybe a walk test be advised for patient and that home health continue. Lattie Haw can be reached at [phone] ext [phone]

## 2019-03-21 ENCOUNTER — Ambulatory Visit (INDEPENDENT_AMBULATORY_CARE_PROVIDER_SITE_OTHER): Payer: Medicare Other | Admitting: Family Medicine

## 2019-03-21 ENCOUNTER — Other Ambulatory Visit: Payer: Self-pay

## 2019-03-21 ENCOUNTER — Encounter: Payer: Self-pay | Admitting: Family Medicine

## 2019-03-21 DIAGNOSIS — R531 Weakness: Secondary | ICD-10-CM | POA: Diagnosis not present

## 2019-03-21 DIAGNOSIS — R2681 Unsteadiness on feet: Secondary | ICD-10-CM | POA: Diagnosis not present

## 2019-03-21 DIAGNOSIS — I639 Cerebral infarction, unspecified: Secondary | ICD-10-CM | POA: Diagnosis not present

## 2019-03-21 DIAGNOSIS — I5022 Chronic systolic (congestive) heart failure: Secondary | ICD-10-CM

## 2019-03-21 NOTE — Progress Notes (Signed)
Subjective:    Patient ID: Melanie Mcdonald, female    DOB: [birth year], 85 y.o.   MRN: [id number]  HPI Virtual Visit via Video Note  I connected with the patient on 03/21/19 at  8:30 AM EDT by a video enabled telemedicine application and verified that I am speaking with the correct person using two identifiers.  Location patient: home Location provider:work or home office Persons participating in the virtual visit: patient, provider  I discussed the limitations of evaluation and management by telemedicine and the availability of in person appointments. The patient expressed understanding and agreed to proceed.   HPI: Here to discuss the possibility of getting a powered wheelchair or scooter. She has been in contact with a company that makes these and she is expecting them to send her some information. She asks if this would be a good thing for her and if we will support her request. Of note she has severe generalized weakness and is very unsteady on her feet. She is almost to the point where she cannot use a walker, and her arms would be too weak to push a manual wheelchair.    ROS: See pertinent positives and negatives per HPI.  Past Medical History:  Diagnosis Date  . Abdominal pain, unspecified site 03/09/2014  . Acute on chronic diastolic CHF (congestive heart failure), NYHA class 3 ([place]) 11/03/2014   Acute on chronic diastolic heart failure  . Acute respiratory failure ([place]) 06/17/2018  . ANEMIA 01/30/2008  . ARTHRITIS 12/11/2007  . BACK PAIN, CHRONIC 06/23/2008  . Backache 06/23/2008   Qualifier: Diagnosis of  By: Niel Hummer MD, [place] CYST, RIGHT 12/17/2007  . Cancer ([place])   . Candidiasis of genitalia in female 07/20/2017  . Cellulitis of left leg 08/14/2011  . Cellulitis of right foot 06/13/2016  . Cervicogenic headache 06/14/2015  . Chest pain 06/08/2011   Atypical chest pain  . Chest pain 07/20/2017  . CHF (congestive heart failure) ([place])   . CHF (congestive heart  failure), NYHA class II, chronic, systolic ([place]) 11/10/9240  . Colon polyps    FRAGMENTS OF HYPERPLASTIC POLYP  . CONTACT DERMATITIS 03/10/2009  . COPD (chronic obstructive pulmonary disease) ([place])   . CORONARY ARTERY DISEASE 03/01/2007   had a normal Myoview stress test 07-13-11  . Coronary atherosclerosis 03/01/2007   Qualifier: Diagnosis of  By: Cori Razor RN, Mikal Plane   . CVA (cerebral vascular accident) ([place]) 07/20/2017  . CYSTOCELE WITHOUT MENTION UTERINE PROLAPSE LAT 09/12/2010  . DEGENERATIVE JOINT DISEASE 08/07/2007  . DEPRESSION 03/01/2007  . Depression, recurrent ([place]) 03/01/2007   Qualifier: Diagnosis of  By: Cori Razor RN, Mikal Plane   . DIABETES MELLITUS, TYPE II 08/26/2008  . Diabetic polyneuropathy associated with diabetes mellitus due to underlying condition ([place]) 06/14/2015  . Diarrhea 08/15/2011  . Diverticulosis of colon (without mention of hemorrhage)   . Dizziness 02/10/2014  . DOE (dyspnea on exertion) 11/03/2014  . Edema 09/02/2008   Centricity Description: EDEMA Qualifier: Diagnosis of  By: Niel Hummer MD, Lorinda Creed  Centricity Description: PERIPHERAL EDEMA Qualifier: Diagnosis of  By: Niel Hummer MD, Lorinda Creed   . Esophageal reflux 11/06/2007  . Essential hypertension 03/01/2007   Qualifier: Diagnosis of  By: Cori Razor RN, Mikal Plane   . Gait instability 11/13/2014  . Gout 06/07/2016  . Headache 12/30/2013  . HIP PAIN 09/15/2010   Qualifier: Diagnosis of  By: Elease Hashimoto MD, [place]    . HYPERKERATOSIS 06/02/2009  . HYPERLIPIDEMIA 08/26/2008  .  Hyperlipidemia 08/26/2008   Qualifier: Diagnosis of  By: Cori Razor RN, Mikal Plane HYPERTENSION 03/01/2007  . Hypokalemia 08/15/2011  . Insomnia 12/11/2007  . Irritable bowel syndrome 01/26/2009  . LABYRINTHITIS 11/03/2009  . Labyrinthitis 11/03/2009   Qualifier: Diagnosis of  By: Niel Hummer MD, Lorinda Creed   . Leg swelling 04/05/2016  . Memory loss 12/30/2013  . Narcolepsy without cataplexy 08/26/2008   Qualifier: Diagnosis of  By: Niel Hummer MD, [place]  Narcolepsy without cataplexy(347.00) 08/26/2008  . NEPHROLITHIASIS 04/29/2009  . OBESITY 03/01/2007  . Obstructive apnea 11/03/2014  . Osteoarthritis 03/15/2018  . Pain in joint, ankle and foot 12/24/2015  . PERIPHERAL VASCULAR DISEASE 01/26/2009  . Peripheral vascular disease ([place]) 01/26/2009   Qualifier: Diagnosis of  By: Niel Hummer MD, [place] history of colonic polyps 03/09/2014  . Stroke ([place])   . SYNCOPE 08/26/2008   had brain MRI on 06-28-12 showing only chronic microvascular ischemia and atrophy   . SYNDROME, RESTLESS LEGS 03/01/2007   Qualifier: Diagnosis of  By: Cori Razor RN, Mikal Plane Thyroid nodule 04/28/2014  . TIA (transient ischemic attack) 11/13/2014  . TRANSIENT ISCHEMIC ATTACK 10/20/2009   had normal brain MRA with patent vertebrals and carotids 06-28-12  . Uncontrolled type 2 diabetes mellitus with hyperglycemia, with long-term current use of insulin ([place]) 10/09/2016  . Weakness generalized 11/13/2014    Past Surgical History:  Procedure Laterality Date  . ABDOMINAL HYSTERECTOMY    . BACK SURGERY     x2  . CARDIAC CATHETERIZATION  07/2009  . CATARACT EXTRACTION    . COLONOSCOPY  11-13-07   per Dr. Deatra Ina, benign polyps, repeat in 5 yrs  . ESOPHAGOGASTRODUODENOSCOPY  11-13-07   per Dr. Deatra Ina, normal   . INTRAOCULAR LENS INSERTION    . LOOP RECORDER INSERTION N/A 07/23/2017   Procedure: LOOP RECORDER INSERTION;  Surgeon: Evans Lance, MD;  Location: [facility];  Service: Cardiovascular;  Laterality: N/A;  . SPINE SURGERY     x 3  . TONSILECTOMY, ADENOIDECTOMY, BILATERAL MYRINGOTOMY AND TUBES      Family History  Problem Relation Age of Onset  . Lupus Mother   . COPD Father      Current Outpatient Medications:  .  clopidogrel (PLAVIX) 75 MG tablet, Take 1 tablet (75 mg total) by mouth daily., Disp: 30 tablet, Rfl: 11 .  Continuous Blood Gluc Receiver (FREESTYLE LIBRE 14 DAY READER) DEVI, 1 Device by Does not apply route daily. Use device daily  as directed, Disp: 1 Device, Rfl: 0 .  Continuous Blood Gluc Sensor (FREESTYLE LIBRE 14 DAY SENSOR) MISC, 1 application by Does not apply route every 14 (fourteen) days., Disp: 2 each, Rfl: 6 .  furosemide (LASIX) 40 MG tablet, Take 2 tablets in the morning and 1 tablet in the evening (Patient taking differently: Take 40-80 mg by mouth See admin instructions. Take 2 tablets by mouth in the morning and 1 tablet in the evening), Disp: 60 tablet, Rfl: 11 .  gabapentin (NEURONTIN) 300 MG capsule, Take 1 capsule (300 mg total) by mouth 3 (three) times daily., Disp: 90 capsule, Rfl: 5 .  glucose blood (ONE TOUCH ULTRA TEST) test strip, 1 each by Other route as needed for other. Use as instructed, Disp: 100 each, Rfl: 1 .  HUMALOG KWIKPEN 100 UNIT/ML KwikPen, Inject 0.2 mLs (20 Units total) into the skin 3 (three) times daily before meals., Disp: 45 mL,  Rfl: 5 .  HYDROcodone-homatropine (HYDROMET) 5-1.5 MG/5ML syrup, Take 5 mLs by mouth every 4 (four) hours as needed., Disp: 240 mL, Rfl: 0 .  insulin degludec (TRESIBA FLEXTOUCH) 100 UNIT/ML SOPN FlexTouch Pen, Inject 0.8 mLs (80 Units total) into the skin every morning., Disp: 9 pen, Rfl: 5 .  Insulin Pen Needle (NOVOFINE) 32G X 6 MM MISC, Check glucoses TID, Disp: 90 each, Rfl: 11 .  levofloxacin (LEVAQUIN) 500 MG tablet, Take 1 tablet (500 mg total) by mouth daily., Disp: 10 tablet, Rfl: 0 .  meclizine (ANTIVERT) 25 MG tablet, Take 1 tablet (25 mg total) by mouth 3 (three) times daily as needed for dizziness., Disp: 60 tablet, Rfl: 5 .  nitroGLYCERIN (NITROSTAT) 0.4 MG SL tablet, Place 0.4 mg under the tongue every 5 (five) minutes as needed for chest pain., Disp: , Rfl:  .  oxyCODONE-acetaminophen (PERCOCET) 10-325 MG tablet, Take 1 tablet by mouth every 4 (four) hours as needed for pain (for rib  injury)., Disp: 30 tablet, Rfl: 0 .  pramipexole (MIRAPEX) 1 MG tablet, TAKE 4 TABLETS(4 MG) BY MOUTH DAILY (Patient taking differently: Take 4 mg by mouth daily.  ), Disp: 368 tablet, Rfl: 1 .  promethazine (PHENERGAN) 25 MG tablet, Take 1 tablet (25 mg total) by mouth every 4 (four) hours as needed for nausea or vomiting., Disp: 60 tablet, Rfl: 5 .  spironolactone (ALDACTONE) 25 MG tablet, Take 1 tablet (25 mg total) by mouth 2 (two) times daily., Disp: 90 tablet, Rfl: 1 .  terbinafine (LAMISIL) 250 MG tablet, Take 1 tablet (250 mg total) by mouth daily., Disp: 90 tablet, Rfl: 1 .  zolpidem (AMBIEN) 10 MG tablet, Take 1 tablet (10 mg total) by mouth at bedtime as needed for up to 30 days for sleep., Disp: 30 tablet, Rfl: 5  EXAM:  VITALS per patient if applicable:  GENERAL: alert, oriented, appears well and in no acute distress  HEENT: atraumatic, conjunttiva clear, no obvious abnormalities on inspection of external nose and ears  NECK: normal movements of the head and neck  LUNGS: on inspection no signs of respiratory distress, breathing rate appears normal, no obvious gross SOB, gasping or wheezing  CV: no obvious cyanosis  MS: moves all visible extremities without noticeable abnormality  PSYCH/NEURO: pleasant and cooperative, no obvious depression or anxiety, speech and thought processing grossly intact  ASSESSMENT AND PLAN: She has generalized weakness, unsteady gait, CHF, and has had a stroke. These all make her an excellent candidate for a motorized wheelchair. I told her we will assist her in getting one.  Alysia Penna, MD  Discussed the following assessment and plan:  No diagnosis found.     I discussed the assessment and treatment plan with the patient. The patient was provided an opportunity to ask questions and all were answered. The patient agreed with the plan and demonstrated an understanding of the instructions.   The patient was advised to call back or seek an in-person evaluation if the symptoms worsen or if the condition fails to improve as anticipated.     Review of Systems     Objective:   Physical Exam         Assessment & Plan:

## 2019-03-24 ENCOUNTER — Telehealth: Payer: Self-pay | Admitting: Family Medicine

## 2019-03-24 NOTE — Telephone Encounter (Signed)
Delmer Islam RN with [facility] called in requesting VO for skilled nursing visit for pt.   Frequency: 1 week 8   CB: [phone]     ALSO, per pt she stepped on a nail over the weekend and would like to know when was her last tetanus shot? Please advise pt of this directly at [facility].

## 2019-03-24 NOTE — Telephone Encounter (Signed)
Called and spoke with Delmer Islam, RN with [facility].  She is aware of verbal orders are ok

## 2019-03-24 NOTE — Telephone Encounter (Signed)
Home Health Verbal Orders - Caller/Agency: [facility]  Callback Number: [phone] Requesting OT/PT/Skilled Nursing/Social Work/Speech Therapy: pt Frequency: 1 week1 2 week3

## 2019-03-26 ENCOUNTER — Telehealth: Payer: Self-pay | Admitting: *Deleted

## 2019-03-26 NOTE — Telephone Encounter (Signed)
Copied from [place] [phone]. Topic: Quick Sport and exercise psychologist Patient (Clinic Use ONLY) >> Mar 26, 2019  7:40 AM Lennox Solders wrote: Reason for CRM: pt said the office call her yesterday afternoon. Pt does not remember the name of person she thinks call  may have been about tetanus shot   Need to get the pt set up for an in office visit for the mobility eval.

## 2019-03-31 NOTE — Telephone Encounter (Signed)
I have called and lmom for [facility] with [facility].  Given the VO for these orders.

## 2019-03-31 NOTE — Telephone Encounter (Signed)
I have called and left another message on the VM for the pt to call back to get this appt set up.

## 2019-04-01 ENCOUNTER — Ambulatory Visit (INDEPENDENT_AMBULATORY_CARE_PROVIDER_SITE_OTHER): Payer: Medicare Other | Admitting: *Deleted

## 2019-04-01 DIAGNOSIS — I6340 Cerebral infarction due to embolism of unspecified cerebral artery: Secondary | ICD-10-CM

## 2019-04-01 LAB — CUP PACEART REMOTE DEVICE CHECK
Date Time Interrogation Session: 20200630114154
Implantable Pulse Generator Implant Date: 20181022

## 2019-04-02 NOTE — Telephone Encounter (Signed)
Pt is scheduled for 7/10 for mobility exam in the office.

## 2019-04-11 ENCOUNTER — Encounter: Payer: Self-pay | Admitting: Family Medicine

## 2019-04-11 ENCOUNTER — Ambulatory Visit: Payer: Medicare Other | Admitting: Family Medicine

## 2019-04-11 ENCOUNTER — Other Ambulatory Visit: Payer: Self-pay

## 2019-04-11 VITALS — BP 120/64 | HR 85 | Temp 98.4°F | Wt 242.5 lb

## 2019-04-11 DIAGNOSIS — M940 Chondrocostal junction syndrome [Tietze]: Secondary | ICD-10-CM

## 2019-04-11 DIAGNOSIS — R300 Dysuria: Secondary | ICD-10-CM

## 2019-04-11 DIAGNOSIS — M7989 Other specified soft tissue disorders: Secondary | ICD-10-CM

## 2019-04-11 DIAGNOSIS — R2681 Unsteadiness on feet: Secondary | ICD-10-CM

## 2019-04-11 DIAGNOSIS — H8303 Labyrinthitis, bilateral: Secondary | ICD-10-CM

## 2019-04-11 DIAGNOSIS — R531 Weakness: Secondary | ICD-10-CM | POA: Diagnosis not present

## 2019-04-11 DIAGNOSIS — M150 Primary generalized (osteo)arthritis: Secondary | ICD-10-CM

## 2019-04-11 DIAGNOSIS — I639 Cerebral infarction, unspecified: Secondary | ICD-10-CM

## 2019-04-11 DIAGNOSIS — Z794 Long term (current) use of insulin: Secondary | ICD-10-CM

## 2019-04-11 DIAGNOSIS — I5022 Chronic systolic (congestive) heart failure: Secondary | ICD-10-CM

## 2019-04-11 DIAGNOSIS — E1165 Type 2 diabetes mellitus with hyperglycemia: Secondary | ICD-10-CM

## 2019-04-11 DIAGNOSIS — R42 Dizziness and giddiness: Secondary | ICD-10-CM

## 2019-04-11 DIAGNOSIS — J439 Emphysema, unspecified: Secondary | ICD-10-CM

## 2019-04-11 DIAGNOSIS — N390 Urinary tract infection, site not specified: Secondary | ICD-10-CM

## 2019-04-11 DIAGNOSIS — M159 Polyosteoarthritis, unspecified: Secondary | ICD-10-CM

## 2019-04-11 DIAGNOSIS — E0842 Diabetes mellitus due to underlying condition with diabetic polyneuropathy: Secondary | ICD-10-CM

## 2019-04-11 DIAGNOSIS — I739 Peripheral vascular disease, unspecified: Secondary | ICD-10-CM

## 2019-04-11 DIAGNOSIS — I10 Essential (primary) hypertension: Secondary | ICD-10-CM

## 2019-04-11 LAB — POCT URINALYSIS DIPSTICK
Bilirubin, UA: NEGATIVE
Blood, UA: NEGATIVE
Glucose, UA: NEGATIVE
Ketones, UA: NEGATIVE
Nitrite, UA: NEGATIVE
Protein, UA: NEGATIVE
Spec Grav, UA: 1.025 (ref 1.010–1.025)
Urobilinogen, UA: 1 E.U./dL
pH, UA: 5 (ref 5.0–8.0)

## 2019-04-11 MED ORDER — PHENAZOPYRIDINE HCL 200 MG PO TABS: 200.0000 mg | ORAL_TABLET | Freq: Three times a day (TID) | ORAL | 0 refills | Status: DC | PRN

## 2019-04-11 MED ORDER — CEPHALEXIN 500 MG PO CAPS: 500.0000 mg | ORAL_CAPSULE | Freq: Three times a day (TID) | ORAL | 0 refills | Status: AC

## 2019-04-11 NOTE — Progress Notes (Addendum)
Subjective:    Patient ID: Melanie Mcdonald, female    DOB: [birth year], 85 y.o.   MRN: [id number]  HPI She is here for several issues. First she is trying to obtain a Hoveround power scooter, and she needs to have paperwork filled out for this. Second she has had a few days of mild sharp pain in rh left chest area. There is no SOB but it hurts to take a deep breath or to twist her trunk. This feels much better today. Third she developed some burning on urination a few days ago and she took 2 days of Keflex that she had at home. Today this feels a little better. No fever. As for her mobility issues, she has had extreme generalized weakness for several years and it is getting worse. This is partly due to overall deconditioning but she also has neuropathy from her diabetes. This causes numbness and tingling and weakness in the arms, hands, legs, and feet. She uses a [employer] walker to get around but this has become very difficult and dangerous for her. She cannot grasp the handles properly and she cannot hold herself up with her arms. She has fallen many times in the past year, and this poses a significant health risk for her. She also has chronic dizziness and unsteadiness of gait, partly due to chronic labyrinthine dysfunction.    Review of Systems  Constitutional: Positive for fatigue.  Respiratory: Positive for shortness of breath. Negative for cough.   Cardiovascular: Positive for chest pain and leg swelling. Negative for palpitations.  Gastrointestinal: Negative.   Genitourinary: Positive for dysuria and urgency. Negative for flank pain and hematuria.  Neurological: Positive for dizziness, weakness and numbness.       Objective:   Physical Exam Constitutional:      Comments: She is quite weak and has difficulty walking down the hall with her walker. She has to stop every few steps to catch her breath and she leans against the walls to keep her balance.   Cardiovascular:     Rate and  Rhythm: Normal rate and regular rhythm.     Pulses: Normal pulses.     Heart sounds: Normal heart sounds.  Pulmonary:     Effort: Pulmonary effort is normal.     Breath sounds: No wheezing, rhonchi or rales.     Comments: She is tender along the left sternal margin  Abdominal:     General: Abdomen is flat. Bowel sounds are normal. There is no distension.     Palpations: Abdomen is soft. There is no mass.     Tenderness: There is no abdominal tenderness. There is no right CVA tenderness, guarding or rebound.     Hernia: No hernia is present.  Musculoskeletal:     Comments: 3+ edema in both lower legs.   Neurological:     Mental Status: She is alert and oriented to person, place, and time.     Comments: She is weak in the legs. Both legs show 2/5 strength and arms show 2/5 strength           Assessment & Plan:  First her recent chest pain is from costochondritis. I explained the benign nature of this pain and that it will be self limited. For the UTI we will send in more Keflex to take, and we will culture the urine sample. For her mobility evaluation, it is obvious she requires a power mobility device to get to the kitchen to prepare meals, to  get to the bathroom to bathe, and to get to her bedroom to dress. She cannot use a cane due to a history of falls. She she now cannot safely use her walker due to a tendency to fall. She does not have the upper extremity strength to operate a manual wheelchair. She cannot use a scooter due to a lack of postural stability. She is now willing and motivated to use a power mobility devise in her home, and she is able form a physical and a mental standpoint to operate such a device safely. She would also be safely able to transfer to and from a power mobility device, and such a transfer would take about 30-60 seconds.  Alysia Penna, MD

## 2019-04-12 LAB — URINE CULTURE
MICRO NUMBER:: 654915
SPECIMEN QUALITY:: ADEQUATE

## 2019-04-13 ENCOUNTER — Other Ambulatory Visit: Payer: Self-pay | Admitting: Family Medicine

## 2019-04-13 DIAGNOSIS — I2510 Atherosclerotic heart disease of native coronary artery without angina pectoris: Secondary | ICD-10-CM

## 2019-04-13 DIAGNOSIS — I5033 Acute on chronic diastolic (congestive) heart failure: Secondary | ICD-10-CM

## 2019-04-13 DIAGNOSIS — I10 Essential (primary) hypertension: Secondary | ICD-10-CM

## 2019-04-13 DIAGNOSIS — I739 Peripheral vascular disease, unspecified: Secondary | ICD-10-CM

## 2019-04-13 NOTE — Progress Notes (Signed)
[facility] Summary Report / Loop Recorder 

## 2019-04-14 ENCOUNTER — Telehealth: Payer: Self-pay

## 2019-04-14 NOTE — Telephone Encounter (Signed)
Pt forms and Rx for Hoveround power chair was faxed to [facility] [employer], confirmation has been received and pt is aware

## 2019-04-15 ENCOUNTER — Ambulatory Visit
Admission: RE | Admit: 2019-04-15 | Discharge: 2019-04-15 | Disposition: A | Discharge status: Home / Self Care | Payer: Medicare Other | Source: Ambulatory Visit | Attending: Family Medicine | Admitting: Family Medicine

## 2019-04-15 DIAGNOSIS — M5441 Lumbago with sciatica, right side: Secondary | ICD-10-CM

## 2019-04-15 MED ORDER — GADOBENATE DIMEGLUMINE 529 MG/ML IV SOLN
20.0000 mL | Freq: Once | INTRAVENOUS | Status: AC | PRN
  Administered 2019-04-15: 20 mL via INTRAVENOUS

## 2019-04-16 ENCOUNTER — Telehealth: Payer: Self-pay

## 2019-04-16 DIAGNOSIS — I69393 Ataxia following cerebral infarction: Secondary | ICD-10-CM | POA: Diagnosis not present

## 2019-04-16 DIAGNOSIS — I69311 Memory deficit following cerebral infarction: Secondary | ICD-10-CM

## 2019-04-16 DIAGNOSIS — Z794 Long term (current) use of insulin: Secondary | ICD-10-CM

## 2019-04-16 DIAGNOSIS — I5033 Acute on chronic diastolic (congestive) heart failure: Secondary | ICD-10-CM

## 2019-04-16 DIAGNOSIS — I2510 Atherosclerotic heart disease of native coronary artery without angina pectoris: Secondary | ICD-10-CM | POA: Diagnosis not present

## 2019-04-16 DIAGNOSIS — W5501XA Bitten by cat, initial encounter: Secondary | ICD-10-CM

## 2019-04-16 DIAGNOSIS — E1151 Type 2 diabetes mellitus with diabetic peripheral angiopathy without gangrene: Secondary | ICD-10-CM

## 2019-04-16 DIAGNOSIS — I69322 Dysarthria following cerebral infarction: Secondary | ICD-10-CM

## 2019-04-16 DIAGNOSIS — J449 Chronic obstructive pulmonary disease, unspecified: Secondary | ICD-10-CM

## 2019-04-16 DIAGNOSIS — E1142 Type 2 diabetes mellitus with diabetic polyneuropathy: Secondary | ICD-10-CM

## 2019-04-16 NOTE — Telephone Encounter (Signed)
Betsy w/[facility] called to report she is at pt's home for PT visit. Pt has expressed possible suicidal thoughts, denies any specific plans, and admits she may be depressed due to all the pain she is in. Pt reports she is not taking her pain medication as directed, she waits until she is in pain and then takes her meds to try and help with the pain. [name] states she has counseled the pt on taking her medication regularly to avoid being overwhelmed with pain. She has provided the pt with the National Suicide Prevention hotline number and has let the pt know we have nurses available 24/ for assistance as well.   Per Dr. Sarajane Jews he would like to see the patient in a virtual visit.

## 2019-04-16 NOTE — Telephone Encounter (Signed)
Spoke with pt at length. She states she is feeling much better after speaking with a friend of hers. She states the friend is planning to come visit her this weekend. She is getting significant pain relief from the injections in her knees by Dr. Tonita Cong. She is looking forward to a visit with pain management. She declines a visit with Dr. Sarajane Jews at this time. She is aware we have openings available on Friday if she changes her mind. She will call us if needed.   Dr. Sarajane Jews - FYI. Patient sounded very well on phone. No urgent concerns at this time. Thanks!

## 2019-04-22 ENCOUNTER — Telehealth: Payer: Self-pay

## 2019-04-22 NOTE — Telephone Encounter (Signed)
PA for zolpidem (AMBIEN) 10 MG tablethas been sent to cover my meds.   Key: [id number] - PA Case ID: [id number] - Rx #: [id number]

## 2019-04-25 NOTE — Telephone Encounter (Signed)
PA has been approved.  Patient is aware.

## 2019-05-04 LAB — CUP PACEART REMOTE DEVICE CHECK
Date Time Interrogation Session: 20200802113556
Implantable Pulse Generator Implant Date: 20181022

## 2019-05-05 ENCOUNTER — Ambulatory Visit (INDEPENDENT_AMBULATORY_CARE_PROVIDER_SITE_OTHER): Payer: Medicare Other | Admitting: *Deleted

## 2019-05-05 DIAGNOSIS — I6340 Cerebral infarction due to embolism of unspecified cerebral artery: Secondary | ICD-10-CM

## 2019-05-09 DIAGNOSIS — M51369 Other intervertebral disc degeneration, lumbar region without mention of lumbar back pain or lower extremity pain: Secondary | ICD-10-CM | POA: Insufficient documentation

## 2019-05-09 DIAGNOSIS — M5136 Other intervertebral disc degeneration, lumbar region: Secondary | ICD-10-CM | POA: Insufficient documentation

## 2019-05-09 DIAGNOSIS — M48061 Spinal stenosis, lumbar region without neurogenic claudication: Secondary | ICD-10-CM | POA: Insufficient documentation

## 2019-05-13 ENCOUNTER — Ambulatory Visit: Payer: Self-pay

## 2019-05-13 ENCOUNTER — Emergency Department (HOSPITAL_COMMUNITY): Payer: Medicare Other

## 2019-05-13 ENCOUNTER — Other Ambulatory Visit: Payer: Self-pay | Admitting: Family Medicine

## 2019-05-13 ENCOUNTER — Emergency Department (HOSPITAL_COMMUNITY)
Admission: EM | Admit: 2019-05-13 | Discharge: 2019-05-13 | Disposition: A | Discharge status: Home / Self Care | Payer: Medicare Other | Attending: Emergency Medicine | Admitting: Emergency Medicine

## 2019-05-13 ENCOUNTER — Encounter (HOSPITAL_COMMUNITY): Payer: Self-pay

## 2019-05-13 ENCOUNTER — Other Ambulatory Visit: Payer: Self-pay

## 2019-05-13 DIAGNOSIS — I110 Hypertensive heart disease with heart failure: Secondary | ICD-10-CM | POA: Insufficient documentation

## 2019-05-13 DIAGNOSIS — E119 Type 2 diabetes mellitus without complications: Secondary | ICD-10-CM | POA: Diagnosis not present

## 2019-05-13 DIAGNOSIS — S40011A Contusion of right shoulder, initial encounter: Secondary | ICD-10-CM | POA: Diagnosis not present

## 2019-05-13 DIAGNOSIS — Z79899 Other long term (current) drug therapy: Secondary | ICD-10-CM | POA: Insufficient documentation

## 2019-05-13 DIAGNOSIS — Z7902 Long term (current) use of antithrombotics/antiplatelets: Secondary | ICD-10-CM | POA: Diagnosis not present

## 2019-05-13 DIAGNOSIS — I5033 Acute on chronic diastolic (congestive) heart failure: Secondary | ICD-10-CM | POA: Insufficient documentation

## 2019-05-13 DIAGNOSIS — I259 Chronic ischemic heart disease, unspecified: Secondary | ICD-10-CM | POA: Insufficient documentation

## 2019-05-13 DIAGNOSIS — R51 Headache: Secondary | ICD-10-CM | POA: Diagnosis not present

## 2019-05-13 DIAGNOSIS — Y999 Unspecified external cause status: Secondary | ICD-10-CM | POA: Insufficient documentation

## 2019-05-13 DIAGNOSIS — S7001XA Contusion of right hip, initial encounter: Secondary | ICD-10-CM | POA: Insufficient documentation

## 2019-05-13 DIAGNOSIS — S4991XA Unspecified injury of right shoulder and upper arm, initial encounter: Secondary | ICD-10-CM | POA: Diagnosis present

## 2019-05-13 DIAGNOSIS — W010XXA Fall on same level from slipping, tripping and stumbling without subsequent striking against object, initial encounter: Secondary | ICD-10-CM | POA: Diagnosis not present

## 2019-05-13 DIAGNOSIS — Z794 Long term (current) use of insulin: Secondary | ICD-10-CM | POA: Insufficient documentation

## 2019-05-13 DIAGNOSIS — Y939 Activity, unspecified: Secondary | ICD-10-CM | POA: Diagnosis not present

## 2019-05-13 DIAGNOSIS — J449 Chronic obstructive pulmonary disease, unspecified: Secondary | ICD-10-CM | POA: Insufficient documentation

## 2019-05-13 DIAGNOSIS — Y929 Unspecified place or not applicable: Secondary | ICD-10-CM | POA: Diagnosis not present

## 2019-05-13 DIAGNOSIS — W19XXXA Unspecified fall, initial encounter: Secondary | ICD-10-CM

## 2019-05-13 MED ORDER — OXYCODONE-ACETAMINOPHEN 5-325 MG PO TABS: 2.0000 | ORAL_TABLET | ORAL | 0 refills | Status: DC | PRN

## 2019-05-13 NOTE — Telephone Encounter (Signed)
Pt called stating that she had a trip and fall last night and has multiple injuries to her shoulder ribs and leg left side. She states that today she is unable to move her shoulder. Pt was advised to seek care at the nearest ER. Care advice read to patient and her son. NT spoke with son who feels that he can transport his mother to the ER. All verbalized understanding of instructions.   Reason for Disposition . Can't move injured shoulder at all  Protocols used: [place]

## 2019-05-13 NOTE — ED Provider Notes (Signed)
[facility] DEPT Provider Note   CSN: [id number] Arrival date & time: 05/13/19  1203     History   Chief Complaint Chief Complaint  Patient presents with  . Fall  . pain right side    HPI Melanie Mcdonald is a 85 y.o. female.     Patient fell last night and complains of pain in her right shoulder and right hip.  She also hit her head  The history is provided by the patient. No language interpreter was used.  Fall This is a new problem. The current episode started yesterday. The problem occurs constantly. The problem has not changed since onset.Pertinent negatives include no chest pain, no abdominal pain and no headaches. Nothing aggravates the symptoms. Nothing relieves the symptoms. She has tried nothing for the symptoms.    Past Medical History:  Diagnosis Date  . Abdominal pain, unspecified site 03/09/2014  . Acute on chronic diastolic CHF (congestive heart failure), NYHA class 3 ([place]) 11/03/2014   Acute on chronic diastolic heart failure  . Acute respiratory failure ([place]) 06/17/2018  . ANEMIA 01/30/2008  . ARTHRITIS 12/11/2007  . BACK PAIN, CHRONIC 06/23/2008  . Backache 06/23/2008   Qualifier: Diagnosis of  By: Niel Hummer MD, [place] CYST, RIGHT 12/17/2007  . Cancer ([place])   . Candidiasis of genitalia in female 07/20/2017  . Cellulitis of left leg 08/14/2011  . Cellulitis of right foot 06/13/2016  . Cervicogenic headache 06/14/2015  . Chest pain 06/08/2011   Atypical chest pain  . Chest pain 07/20/2017  . CHF (congestive heart failure) ([place])   . CHF (congestive heart failure), NYHA class II, chronic, systolic ([place]) 02/02/6643  . Colon polyps    FRAGMENTS OF HYPERPLASTIC POLYP  . CONTACT DERMATITIS 03/10/2009  . COPD (chronic obstructive pulmonary disease) ([place])   . CORONARY ARTERY DISEASE 03/01/2007   had a normal Myoview stress test 07-13-11  . Coronary atherosclerosis 03/01/2007   Qualifier: Diagnosis of  By: Cori Razor RN, Mikal Plane   .  CVA (cerebral vascular accident) ([place]) 07/20/2017  . CYSTOCELE WITHOUT MENTION UTERINE PROLAPSE LAT 09/12/2010  . DEGENERATIVE JOINT DISEASE 08/07/2007  . DEPRESSION 03/01/2007  . Depression, recurrent ([place]) 03/01/2007   Qualifier: Diagnosis of  By: Cori Razor RN, Mikal Plane   . DIABETES MELLITUS, TYPE II 08/26/2008  . Diabetic polyneuropathy associated with diabetes mellitus due to underlying condition ([place]) 06/14/2015  . Diarrhea 08/15/2011  . Diverticulosis of colon (without mention of hemorrhage)   . Dizziness 02/10/2014  . DOE (dyspnea on exertion) 11/03/2014  . Edema 09/02/2008   Centricity Description: EDEMA Qualifier: Diagnosis of  By: Niel Hummer MD, Lorinda Creed  Centricity Description: PERIPHERAL EDEMA Qualifier: Diagnosis of  By: Niel Hummer MD, Lorinda Creed   . Esophageal reflux 11/06/2007  . Essential hypertension 03/01/2007   Qualifier: Diagnosis of  By: Cori Razor RN, Mikal Plane   . Gait instability 11/13/2014  . Gout 06/07/2016  . Headache 12/30/2013  . HIP PAIN 09/15/2010   Qualifier: Diagnosis of  By: Elease Hashimoto MD, [place]    . HYPERKERATOSIS 06/02/2009  . HYPERLIPIDEMIA 08/26/2008  . Hyperlipidemia 08/26/2008   Qualifier: Diagnosis of  By: Cori Razor RN, Mikal Plane HYPERTENSION 03/01/2007  . Hypokalemia 08/15/2011  . Insomnia 12/11/2007  . Irritable bowel syndrome 01/26/2009  . LABYRINTHITIS 11/03/2009  . Labyrinthitis 11/03/2009   Qualifier: Diagnosis of  By: Niel Hummer MD, Lorinda Creed   . Leg swelling 04/05/2016  . Memory loss 12/30/2013  .  Narcolepsy without cataplexy 08/26/2008   Qualifier: Diagnosis of  By: Niel Hummer MD, [place] Narcolepsy without cataplexy(347.00) 08/26/2008  . NEPHROLITHIASIS 04/29/2009  . OBESITY 03/01/2007  . Obstructive apnea 11/03/2014  . Osteoarthritis 03/15/2018  . Pain in joint, ankle and foot 12/24/2015  . PERIPHERAL VASCULAR DISEASE 01/26/2009  . Peripheral vascular disease ([place]) 01/26/2009   Qualifier: Diagnosis of  By: Niel Hummer MD, [place] history of  colonic polyps 03/09/2014  . Stroke ([place])   . SYNCOPE 08/26/2008   had brain MRI on 06-28-12 showing only chronic microvascular ischemia and atrophy   . SYNDROME, RESTLESS LEGS 03/01/2007   Qualifier: Diagnosis of  By: Cori Razor RN, Mikal Plane Thyroid nodule 04/28/2014  . TIA (transient ischemic attack) 11/13/2014  . TRANSIENT ISCHEMIC ATTACK 10/20/2009   had normal brain MRA with patent vertebrals and carotids 06-28-12  . Uncontrolled type 2 diabetes mellitus with hyperglycemia, with long-term current use of insulin ([place]) 10/09/2016  . Weakness generalized 11/13/2014    Patient Active Problem List   Diagnosis Date Noted  . Morbid obesity ([place]) 04/11/2019  . COPD with emphysema ([place]) 04/11/2019  . Trigger little finger of left hand 11/20/2018  . Osteoarthritis 03/15/2018  . CHF (congestive heart failure), NYHA class II, chronic, systolic ([place]) 65/78/4696  . CVA (cerebral vascular accident) ([place]) 07/20/2017  . Candidiasis of genitalia in female 07/20/2017  . Uncontrolled type 2 diabetes mellitus with hyperglycemia, with long-term current use of insulin ([place]) 10/09/2016  . Gout 06/07/2016  . Leg swelling 04/05/2016  . Pain in joint, ankle and foot 12/24/2015  . Diabetic polyneuropathy associated with diabetes mellitus due to underlying condition ([place]) 06/14/2015  . Cervicogenic headache 06/14/2015  . Weakness generalized 11/13/2014  . TIA (transient ischemic attack) 11/13/2014  . Gait instability 11/13/2014  . Acute on chronic diastolic CHF (congestive heart failure), NYHA class 3 ([place]) 11/03/2014  . Obstructive apnea 11/03/2014  . DOE (dyspnea on exertion) 11/03/2014  . Thyroid nodule 04/28/2014  . Personal history of colonic polyps 03/09/2014  . Dizziness 02/10/2014  . Memory loss 12/30/2013  . Headache 12/30/2013  . Hypokalemia 08/15/2011  . Diarrhea 08/15/2011  . Chest pain 06/08/2011  . HIP PAIN 09/15/2010  . CYSTOCELE WITHOUT MENTION UTERINE PROLAPSE LAT 09/12/2010  .  Labyrinthitis 11/03/2009  . NEPHROLITHIASIS 04/29/2009  . Peripheral vascular disease ([place]) 01/26/2009  . IRRITABLE BOWEL SYNDROME 01/26/2009  . Edema 09/02/2008  . Hyperlipidemia 08/26/2008  . Narcolepsy without cataplexy 08/26/2008  . Backache 06/23/2008  . Insomnia 12/11/2007  . Esophageal reflux 11/06/2007  . Depression, recurrent ([place]) 03/01/2007  . SYNDROME, RESTLESS LEGS 03/01/2007  . Essential hypertension 03/01/2007  . Coronary atherosclerosis 03/01/2007    Past Surgical History:  Procedure Laterality Date  . ABDOMINAL HYSTERECTOMY    . BACK SURGERY     x2  . CARDIAC CATHETERIZATION  07/2009  . CATARACT EXTRACTION    . COLONOSCOPY  11-13-07   per Dr. Deatra Ina, benign polyps, repeat in 5 yrs  . ESOPHAGOGASTRODUODENOSCOPY  11-13-07   per Dr. Deatra Ina, normal   . INTRAOCULAR LENS INSERTION    . LOOP RECORDER INSERTION N/A 07/23/2017   Procedure: LOOP RECORDER INSERTION;  Surgeon: Evans Lance, MD;  Location: [facility];  Service: Cardiovascular;  Laterality: N/A;  . SPINE SURGERY     x 3  . TONSILECTOMY, ADENOIDECTOMY, BILATERAL MYRINGOTOMY AND TUBES       OB History   No obstetric  history on file.      Home Medications    Prior to Admission medications   Medication Sig Start Date End Date Taking? Authorizing Provider  clopidogrel (PLAVIX) 75 MG tablet Take 1 tablet (75 mg total) by mouth daily. 05/06/18   Cameron Sprang, MD  Continuous Blood Gluc Receiver (FREESTYLE LIBRE 14 DAY READER) DEVI 1 Device by Does not apply route daily. Use device daily as directed 12/04/18   Laurey Morale, MD  Continuous Blood Gluc Sensor (FREESTYLE LIBRE 14 DAY SENSOR) MISC 1 application by Does not apply route every 14 (fourteen) days. 12/09/18   Laurey Morale, MD  furosemide (LASIX) 40 MG tablet Take 2 tablets in the morning and 1 tablet in the evening Patient taking differently: Take 40-80 mg by mouth See admin instructions. Take 2 tablets by mouth in the morning and 1 tablet  in the evening 11/15/18   Laurey Morale, MD  gabapentin (NEURONTIN) 300 MG capsule Take 1 capsule (300 mg total) by mouth 3 (three) times daily. 12/10/18   Laurey Morale, MD  glucose blood (ONE TOUCH ULTRA TEST) test strip 1 each by Other route as needed for other. Use as instructed 06/21/18   Laurey Morale, MD  HUMALOG KWIKPEN 100 UNIT/ML KwikPen Inject 0.2 mLs (20 Units total) into the skin 3 (three) times daily before meals. 11/01/18   Laurey Morale, MD  HYDROcodone-homatropine (HYDROMET) 5-1.5 MG/5ML syrup Take 5 mLs by mouth every 4 (four) hours as needed. 12/26/18   Laurey Morale, MD  insulin degludec (TRESIBA FLEXTOUCH) 100 UNIT/ML SOPN FlexTouch Pen Inject 0.8 mLs (80 Units total) into the skin every morning. 12/09/18   Laurey Morale, MD  Insulin Pen Needle (NOVOFINE) 32G X 6 MM MISC Check glucoses TID 06/21/18   Laurey Morale, MD  levofloxacin (LEVAQUIN) 500 MG tablet Take 1 tablet (500 mg total) by mouth daily. 01/02/19   Laurey Morale, MD  meclizine (ANTIVERT) 25 MG tablet Take 1 tablet (25 mg total) by mouth 3 (three) times daily as needed for dizziness. 12/26/18   Laurey Morale, MD  nitroGLYCERIN (NITROSTAT) 0.4 MG SL tablet Place 0.4 mg under the tongue every 5 (five) minutes as needed for chest pain.    [provider]  oxyCODONE-acetaminophen (PERCOCET) 10-325 MG tablet Take 1 tablet by mouth every 4 (four) hours as needed for pain (for rib  injury). 03/14/19   Laurey Morale, MD  phenazopyridine (PYRIDIUM) 200 MG tablet Take 1 tablet (200 mg total) by mouth 3 (three) times daily as needed (urinary pain). 04/11/19   Laurey Morale, MD  pramipexole (MIRAPEX) 1 MG tablet TAKE 4 TABLETS(4 MG) BY MOUTH DAILY Patient taking differently: Take 4 mg by mouth daily.  10/09/18   Laurey Morale, MD  promethazine (PHENERGAN) 25 MG tablet Take 1 tablet (25 mg total) by mouth every 4 (four) hours as needed for nausea or vomiting. 12/26/18   Laurey Morale, MD  spironolactone (ALDACTONE) 25 MG  tablet TAKE 1 TABLET(25 MG) BY MOUTH TWICE DAILY 04/23/19   Laurey Morale, MD  terbinafine (LAMISIL) 250 MG tablet Take 1 tablet (250 mg total) by mouth daily. 06/07/18   Laurey Morale, MD  zolpidem (AMBIEN) 10 MG tablet Take 1 tablet (10 mg total) by mouth at bedtime as needed for up to 30 days for sleep. 11/01/18 12/01/18  Laurey Morale, MD    Family History Family History  Problem Relation Age  of Onset  . Lupus Mother   . COPD Father     Social History Social History   Tobacco Use  . Smoking status: Never Smoker  . Smokeless tobacco: Never Used  Substance Use Topics  . Alcohol use: No    Alcohol/week: 0.0 standard drinks  . Drug use: No    Types: Oxycodone     Allergies   Lyrica [pregabalin], Aspirin, Ciprofloxacin, Clarithromycin, Doxycycline, Erythromycin, Lisinopril, Macrobid [nitrofurantoin monohyd macro], Metolazone, Other, Penicillins, Sulfonamide derivatives, and Valtrex [valacyclovir hcl]   Review of Systems Review of Systems  Constitutional: Negative for appetite change and fatigue.  HENT: Negative for congestion, ear discharge and sinus pressure.        Headache  Eyes: Negative for discharge.  Respiratory: Negative for cough.   Cardiovascular: Negative for chest pain.  Gastrointestinal: Negative for abdominal pain and diarrhea.  Genitourinary: Negative for frequency and hematuria.  Musculoskeletal: Negative for back pain.       Patient has pain in right shoulder right elbow right side of head and right hip  Skin: Negative for rash.  Neurological: Negative for seizures and headaches.  Psychiatric/Behavioral: Negative for hallucinations.     Physical Exam Updated Vital Signs BP (!) 149/73 (BP Location: Left Arm)   Pulse 77   Temp 98 F (36.7 C)   Resp 16   Ht 5\' 8"  (1.727 m)   Wt 107.5 kg   SpO2 96%   BMI 36.04 kg/m   Physical Exam Vitals signs and nursing note reviewed.  Constitutional:      Appearance: She is well-developed.  HENT:     Head:  Normocephalic.     Comments: Tenderness to right side of head    Nose: Nose normal.  Eyes:     General: No scleral icterus.    Conjunctiva/sclera: Conjunctivae normal.  Neck:     Musculoskeletal: Neck supple.     Thyroid: No thyromegaly.  Cardiovascular:     Rate and Rhythm: Normal rate and regular rhythm.     Heart sounds: No murmur. No friction rub. No gallop.   Pulmonary:     Breath sounds: No stridor. No wheezing or rales.  Chest:     Chest wall: No tenderness.  Abdominal:     General: There is no distension.     Tenderness: There is no abdominal tenderness. There is no rebound.  Musculoskeletal:     Comments: Tenderness to right shoulder right hip and right elbow  Lymphadenopathy:     Cervical: No cervical adenopathy.  Skin:    Findings: No erythema or rash.  Neurological:     Mental Status: She is alert and oriented to person, place, and time.     Motor: No abnormal muscle tone.     Coordination: Coordination normal.  Psychiatric:        Behavior: Behavior normal.      ED Treatments / Results  Labs (all labs ordered are listed, but only abnormal results are displayed) Labs Reviewed - No data to display  EKG None  Radiology Dg Shoulder Right  Result Date: 05/13/2019 CLINICAL DATA:  Fall, right shoulder pain EXAM: RIGHT SHOULDER - 2+ VIEW COMPARISON:  None. FINDINGS: No acute fracture or traumatic malalignment. Degenerative changes are present the acromioclavicular and glenohumeral joints. Hazy interstitial opacities are present within the chest. The right chest wall is unremarkable. Medical device projects external to the patient of the right shoulder. IMPRESSION: No acute osseous abnormality. Hazy lung opacities could reflect edema, correlate with  symptoms and consider dedicated chest radiograph. Electronically Signed   By: Lovena Le M.D.   On: 05/13/2019 14:40   Dg Elbow Complete Right  Result Date: 05/13/2019 CLINICAL DATA:  Fall, right elbow pain. EXAM:  RIGHT ELBOW - COMPLETE 3+ VIEW COMPARISON:  None. FINDINGS: No acute fracture or traumatic malalignment. A small elbow joint effusion is present. Corticated mineralization is within the antecubital fossa could reflect benign soft tissue mineralization versus intra-articular body. Additional scattered benign-appearing soft tissue mineralization is present. IMPRESSION: Small elbow joint effusion. No acute osseous abnormality. Electronically Signed   By: Lovena Le M.D.   On: 05/13/2019 14:42   Dg Hip Unilat With Pelvis 2-3 Views Right  Result Date: 05/13/2019 CLINICAL DATA:  Fall, right hip pain EXAM: DG HIP (WITH OR WITHOUT PELVIS) 2-3V RIGHT COMPARISON:  None. FINDINGS: Body habitus results in relative photopenia which may limit detection of small nondisplaced fractures. There is no definitive evidence of hip fracture or dislocation. Bones of the pelvis remain congruent. Sacrum is suboptimally evaluated due to overlying bowel gas. Mild bilateral hip arthrosis is present. Enthesopathic changes of present on both greater trochanters and iliac crests. Mild degenerative changes present in the included lumbar spine. IMPRESSION: No acute osseous abnormality. Electronically Signed   By: Lovena Le M.D.   On: 05/13/2019 14:37    Procedures Procedures (including critical care time)  Medications Ordered in ED Medications - No data to display   Initial Impression / Assessment and Plan / ED Course  I have reviewed the triage vital signs and the nursing notes.  Pertinent labs & imaging results that were available during my care of the patient were reviewed by me and considered in my medical decision making (see chart for details).        Patient with fall and contusion to shoulder hip had all x-rays were negative.  Patient will follow-up with PCP and use a sling  Final Clinical Impressions(s) / ED Diagnoses   Final diagnoses:  Fall    ED Discharge Orders    None       Milton Ferguson, MD  05/14/19 [phone]

## 2019-05-13 NOTE — Telephone Encounter (Signed)
FYI   Pt was advised to go to the ER for help

## 2019-05-13 NOTE — ED Triage Notes (Signed)
Per EMS:  Pt fell last night at 9:30 pm on her right side.  Pt c/o of ride-sided pain in neck, pain in R shoulder with limited mobility, and pt c/o of R knee and leg pain.  No LOC.  Pt on Plavix.  Pt hx of CHF.

## 2019-05-13 NOTE — Telephone Encounter (Signed)
See note

## 2019-05-13 NOTE — Discharge Instructions (Signed)
Follow up with your md later this week for recheck °

## 2019-05-14 ENCOUNTER — Telehealth: Payer: Self-pay | Admitting: *Deleted

## 2019-05-14 NOTE — Telephone Encounter (Signed)
The rx is ready to fax  

## 2019-05-14 NOTE — Telephone Encounter (Signed)
Copied from [place] [phone]. Topic: General - Other >> May 14, 2019 11:51 AM Percell Belt A wrote: Reason for CRM:  Lattie Haw with [facility] called in to say that pt is needing order for bariatric size bedside commode .  The one that she has now is not big enough.

## 2019-05-15 NOTE — Telephone Encounter (Signed)
No call back number or fax number was given. I spoke with the patient she is going to call back with Lisa's number. CRM created.

## 2019-05-15 NOTE — Telephone Encounter (Signed)
See note

## 2019-05-15 NOTE — Progress Notes (Signed)
[facility] Summary Report / Loop Recorder 

## 2019-05-15 NOTE — Telephone Encounter (Signed)
Melanie Mcdonald # [id number] ex [phone]

## 2019-05-16 NOTE — Telephone Encounter (Signed)
Left message for Lattie Haw to call back. [place] fax number. CRM created.

## 2019-05-16 NOTE — Telephone Encounter (Signed)
Not in Epic. It needs to be a written order. Have Jamyah pick it up and take it to a medical supply store

## 2019-05-16 NOTE — Telephone Encounter (Signed)
Lattie Haw with [facility] called to inform Ria Comment that she does not have anything to do with getting the patient the medical commode that she is requesting.  She stated that this equipment should be order the regular way that any medical supplies are ordered for patients.  If there are any questions, you can leave a detailed message on secured line at [phone], ext. [phone]

## 2019-05-16 NOTE — Telephone Encounter (Signed)
Spoke with patient she will have the Rx picked up.

## 2019-05-16 NOTE — Telephone Encounter (Signed)
See note

## 2019-05-16 NOTE — Telephone Encounter (Signed)
Please advise. Can this be ordered through epic?

## 2019-05-19 ENCOUNTER — Other Ambulatory Visit: Payer: Self-pay | Admitting: Family Medicine

## 2019-05-21 ENCOUNTER — Other Ambulatory Visit: Payer: Self-pay | Admitting: Family Medicine

## 2019-05-21 NOTE — Telephone Encounter (Signed)
Last filled 11/01/2018 Last OV 04/11/2019  Ok to fill?

## 2019-05-26 ENCOUNTER — Telehealth: Payer: Self-pay | Admitting: Family Medicine

## 2019-05-26 DIAGNOSIS — E1151 Type 2 diabetes mellitus with diabetic peripheral angiopathy without gangrene: Secondary | ICD-10-CM

## 2019-05-26 DIAGNOSIS — W5501XA Bitten by cat, initial encounter: Secondary | ICD-10-CM

## 2019-05-26 DIAGNOSIS — I5033 Acute on chronic diastolic (congestive) heart failure: Secondary | ICD-10-CM | POA: Diagnosis not present

## 2019-05-26 DIAGNOSIS — I69311 Memory deficit following cerebral infarction: Secondary | ICD-10-CM

## 2019-05-26 DIAGNOSIS — I69393 Ataxia following cerebral infarction: Secondary | ICD-10-CM

## 2019-05-26 DIAGNOSIS — E1142 Type 2 diabetes mellitus with diabetic polyneuropathy: Secondary | ICD-10-CM

## 2019-05-26 DIAGNOSIS — J449 Chronic obstructive pulmonary disease, unspecified: Secondary | ICD-10-CM | POA: Diagnosis not present

## 2019-05-26 DIAGNOSIS — I2510 Atherosclerotic heart disease of native coronary artery without angina pectoris: Secondary | ICD-10-CM

## 2019-05-26 DIAGNOSIS — I69322 Dysarthria following cerebral infarction: Secondary | ICD-10-CM

## 2019-05-26 DIAGNOSIS — Z794 Long term (current) use of insulin: Secondary | ICD-10-CM

## 2019-05-26 NOTE — Telephone Encounter (Signed)
Betsy with [facility] calling for verbal orders to extend home health PT  2 wk / 4 1 wk / 3  cb [phone]

## 2019-05-26 NOTE — Telephone Encounter (Signed)
Ok for orders? 

## 2019-05-26 NOTE — Telephone Encounter (Signed)
Please okay the orders  

## 2019-05-27 ENCOUNTER — Ambulatory Visit: Payer: Medicare Other | Admitting: Family Medicine

## 2019-05-27 ENCOUNTER — Inpatient Hospital Stay: Payer: Medicare Other | Admitting: Family Medicine

## 2019-05-27 ENCOUNTER — Encounter: Payer: Self-pay | Admitting: Family Medicine

## 2019-05-27 VITALS — BP 130/70 | HR 100 | Temp 99.5°F

## 2019-05-27 DIAGNOSIS — S40011D Contusion of right shoulder, subsequent encounter: Secondary | ICD-10-CM

## 2019-05-27 DIAGNOSIS — S7001XD Contusion of right hip, subsequent encounter: Secondary | ICD-10-CM | POA: Diagnosis not present

## 2019-05-27 MED ORDER — PRAMIPEXOLE DIHYDROCHLORIDE 1 MG PO TABS: ORAL_TABLET | ORAL | 1 refills | Status: DC

## 2019-05-27 NOTE — Telephone Encounter (Signed)
Left a detailed message on verified voice mail.   

## 2019-05-27 NOTE — Progress Notes (Signed)
   Subjective:    Patient ID: Melanie Mcdonald, female    DOB: 01/18/1933, 85 y.o.   MRN: [id number]  HPI Here to follow up an ER visit on 05-13-19 for injuries from a fall at home that day. She says she tripped over a vacuum hose, and she landed on the floor on her right side. No LOC and she has full memory of the incident. She had severe pain in the right shoulder and the right hip. She also had a lot of bruising on the right thigh, and her neck was stiff. In the ER all labs were normal. Xrays of the right shoulder were normal with no fractures. Also CT scans of the head, cervical spine, and right hip showed no fractures. Since then she continues to have a lot of right shoulder pain and she cannot move her arm above her head. The right hip is till sore but seems to be getting better. She is using a walker today, but she is very pleased to tell me that she got the power scooter we have trying to get her. She rides it around her house but has not taken it outside the house yet.    Review of Systems  Constitutional: Negative.   Respiratory: Negative.   Cardiovascular: Positive for leg swelling. Negative for chest pain and palpitations.  Musculoskeletal: Positive for arthralgias and gait problem.  Neurological: Positive for dizziness and weakness.       Objective:   Physical Exam Constitutional:      Comments: Frail   Cardiovascular:     Rate and Rhythm: Normal rate and regular rhythm.     Pulses: Normal pulses.     Heart sounds: Normal heart sounds.  Pulmonary:     Effort: Pulmonary effort is normal.     Breath sounds: Normal breath sounds.  Musculoskeletal:     Comments: The right shoulder is quite tender, has a lot of crepitus, and ROM is limited by pain. I cannot raise her arm to horizontal without causing a lot of pain   Neurological:     Mental Status: She is alert.           Assessment & Plan:  She had a recent fall which contused the right shoulder and hip. No fractures  were found, but she likely has an injury to the rotator cuff. She is already scheduled to see Dr. Suella Broad this Friday for steroid injections to the neck, and I asked her to have him examine the shoulder.  Alysia Penna, MD

## 2019-06-02 ENCOUNTER — Telehealth: Payer: Self-pay | Admitting: Family Medicine

## 2019-06-02 NOTE — Telephone Encounter (Unsigned)
Copied from [place] [id number]. Topic: Quick Communication - Home Health Verbal Orders >> Jun 02, 2019  8:45 AM Ivar Drape wrote: Caller/Agency:   Danella Deis PT w/[facility] Number:   [phone] [name] wanted the provider to know that the patient's weight was 245 today and on the 05/28/2019 her weight was 240. This is out of the perimeters of 235-244.

## 2019-06-04 ENCOUNTER — Telehealth: Payer: Self-pay | Admitting: Family Medicine

## 2019-06-04 ENCOUNTER — Telehealth: Payer: Self-pay

## 2019-06-04 NOTE — Telephone Encounter (Signed)
Please advise. Are we able to prescribe this?

## 2019-06-04 NOTE — Telephone Encounter (Signed)
Copied from [place] [phone]. Topic: Quick Communication - Rx Refill/Question >> Jun 04, 2019  8:37 AM Rainey Pines A wrote: Medication: Leg compression pump( Patient is also requesting refill because current pump is e xpired.)  Has the patient contacted their pharmacy? Yes (Agent: If no, request that the patient contact the pharmacy for the refill.) (Agent: If yes, when and what did the pharmacy advise?)[place] PCP  Preferred Pharmacy (with phone number or street name):[id number] [place], [place] - [address] [phone] (Phone) [phone] (Fax)    Agent: Please be advised that RX refills may take up to 3 business days. We ask that you follow-up with your pharmacy.

## 2019-06-04 NOTE — Telephone Encounter (Signed)
I cannot remember if I ordered these for her or not. See if you can get more information about them like the manufacturer's name and where these were obtained last time

## 2019-06-04 NOTE — Telephone Encounter (Signed)
Copied from [employer] [id number]. Topic: General - Other >> Jun 04, 2019  8:35 AM Rainey Pines A wrote: [name] with [facility] called to inform Dr. Sarajane Jews that patient has a fall last night. She landed on right knee and there is bruising and swelling and pain in right wrist. Best contact number [phone]

## 2019-06-04 NOTE — Telephone Encounter (Signed)
I suggest she go to urgent care so they can get Xrays if needed

## 2019-06-04 NOTE — Telephone Encounter (Signed)
Spoke with [employer]. She is aware of Dr. Barbie Banner recommendations and will call the patient. Nothing further needed.

## 2019-06-05 NOTE — Telephone Encounter (Signed)
Spoke with patient. She stated Dr. Sarajane Jews gave her a hand written Rx in february but it has expired. I asked the patient to read me what the Rx said and she stated she would have to find it and call back.  When the patient calls back please get the information that is on her Rx and send a message back.  CRM created.

## 2019-06-06 ENCOUNTER — Ambulatory Visit (INDEPENDENT_AMBULATORY_CARE_PROVIDER_SITE_OTHER): Payer: Medicare Other | Admitting: *Deleted

## 2019-06-06 DIAGNOSIS — I6340 Cerebral infarction due to embolism of unspecified cerebral artery: Secondary | ICD-10-CM

## 2019-06-06 LAB — CUP PACEART REMOTE DEVICE CHECK
Date Time Interrogation Session: 20200904120819
Implantable Pulse Generator Implant Date: 20181022

## 2019-06-10 ENCOUNTER — Telehealth: Payer: Self-pay | Admitting: Family Medicine

## 2019-06-10 NOTE — Telephone Encounter (Signed)
Betsy from [facility] called stating patient has had a 10 pound weight in a one week period.  Patient weight was 242 9/2 and today 9/8 weight was 252.  Patient also hasn't not been taking medication "furosemide".   Betsy call back [phone]

## 2019-06-10 NOTE — Telephone Encounter (Signed)
See note

## 2019-06-11 ENCOUNTER — Other Ambulatory Visit: Payer: Self-pay | Admitting: Family Medicine

## 2019-06-11 NOTE — Telephone Encounter (Signed)
We have been through this a thousand times. Tell Melanie Mcdonald she MUST take the Furosemide every single day no matter what (2 in the am and 1 in the pm)

## 2019-06-11 NOTE — Telephone Encounter (Signed)
Please advise. Should patient continue this medication. 

## 2019-06-11 NOTE — Telephone Encounter (Signed)
Left a detailed message on verified voice mail.  CRM created.  

## 2019-06-13 ENCOUNTER — Other Ambulatory Visit: Payer: Self-pay | Admitting: Family Medicine

## 2019-06-13 NOTE — Telephone Encounter (Signed)
Last filled 04/11/2019. Please advise. Should the patient continue this?

## 2019-06-17 ENCOUNTER — Telehealth: Payer: Self-pay | Admitting: Family Medicine

## 2019-06-17 NOTE — Telephone Encounter (Signed)
Caller name: Lattie Haw  Relation to pt: [facility]  Call back number:patient #  [phone]    Reason for call:  Nurse calling on patient behalf stating PCP wrote out a Rx back in February. Patient never used the rx therefore expired, patient requesting new Rx for compression device please send to [facility] phone # [phone]

## 2019-06-18 NOTE — Telephone Encounter (Signed)
Patient is requesting pcp nurse to call back regarding compression pump Patient call back [phone]

## 2019-06-18 NOTE — Progress Notes (Signed)
[facility] Summary Report / Loop Recorder 

## 2019-06-18 NOTE — Telephone Encounter (Signed)
Spoke to pt and advise that I will route the message to Dr. Sarajane Jews for approval and will call when its ready for pick up if appropriate.

## 2019-06-19 ENCOUNTER — Telehealth (INDEPENDENT_AMBULATORY_CARE_PROVIDER_SITE_OTHER): Payer: Medicare Other | Admitting: Family Medicine

## 2019-06-19 ENCOUNTER — Other Ambulatory Visit: Payer: Self-pay

## 2019-06-19 ENCOUNTER — Encounter: Payer: Self-pay | Admitting: Family Medicine

## 2019-06-19 DIAGNOSIS — E162 Hypoglycemia, unspecified: Secondary | ICD-10-CM

## 2019-06-19 DIAGNOSIS — R42 Dizziness and giddiness: Secondary | ICD-10-CM | POA: Diagnosis not present

## 2019-06-19 NOTE — Progress Notes (Signed)
Virtual Visit via Telephone Note  I connected with Enriqueta Shutter on 06/19/19 at  4:20 PM EDT by telephone and verified that I am speaking with the correct person using two identifiers.   I discussed the limitations, risks, security and privacy concerns of performing an evaluation and management service by telephone and the availability of in person appointments. I also discussed with the patient that there may be a patient responsible charge related to this service. The patient expressed understanding and agreed to proceed.  Location patient: home Location provider: work or home office Participants present for the call: patient, provider Patient did not have a visit in the prior 7 days to address this/these issue(s).   History of Present Illness:   Melanie Mcdonald is a pleasant 85 yo. She requested a phone issue regarding her BS. She is a poor historian. She reports a low blood sugar yesterday down to 20. Reports she has low blood sugars fairly often done around 50. Is on 80 units of basal insulin and 20units of insulin with meals. She forgets meals sometimes and that is what happened yesterday. Report nurse was there for nurse visit when the very low BS occured and "saved her life." Sugars ok today, now in 150s today. She also mentions that all day today she has been having intermittent dizziness and blurry vision. She thought this was because of the low blood sugar she had yesterday. Poor historian. Reports head injury a few weeks ago. Reports recovering from ear infection and has some HAs with that. Reports sees Dr. Nelva Bush for DDD and gets some numbness in extremities she was told is from that and is reportedly unchanged. Reports balance is always off and has had strokes in the past. But, feels different today. No fevers. She feels safe. She refuses EMS or emergency care.   Observations/Objective: Patient sounds cheerful and well on the phone. I do not appreciate any SOB. Speech and thought  processing are grossly intact. Patient reported vitals:  Assessment and Plan:  Hypoglycemia Dizziness Blurred vision  Advised that evaluation for some of her complaints (the dizziness and blurred vision that she feels is new today) is not possible via a telemedicine visit and advised she seek in person care. Offered to assist in EMS transport - but she declined and agrees to have her son take her for evaluation at a nearby [facility]. She refuses EMS or ER.  Discussed her issues with low blood sugars lately (note today) and advised to decrease basal insulin by ten units acutely. Advised not to take mealtime insulin unless is eating a meal. Advised will copy this note to PCP so that he is aware and for further recommendations regarding her blood sugar management.  Follow Up Instructions:  I did not refer this patient for an OV in the next 24 hours for hypoglycemia which she called about initially. I did advise in-person care for her other issues and referred to to [facility] for that.  I discussed the assessment and treatment plan with the patient. The patient was provided an opportunity to ask questions and all were answered. The patient agreed with the plan and demonstrated an understanding of the instructions.   The patient was advised to call back or seek an in-person evaluation if the symptoms worsen or if the condition fails to improve as anticipated.  I provided 12 minutes of non-face-to-face time during this encounter.   Lucretia Kern, DO

## 2019-06-19 NOTE — Telephone Encounter (Signed)
Spoke to pt to advise of update with Rx order. Pt stated that her blood sugar was lower than 20 last night. Pt stated that her glucometer read low. Pt stated that it always gave her numbers so she called the manufacturer and they advised that the meter will not read anything less than 20. Pt stated that the [facility] nurse came shortly after and helped her get her number up. She stated that she is really dizzy and unable to get out of bed. Pt also stated that she checked her glucose around 1:30pm and it was at 150. Pt stated she is not sure what is going on.  Pt has been scheduled with Dr.Kim.  Routing to Dr. Maudie Mercury for [facility] for appt.

## 2019-06-19 NOTE — Telephone Encounter (Signed)
The rx is ready to fax  

## 2019-06-20 ENCOUNTER — Telehealth: Payer: Self-pay | Admitting: Family Medicine

## 2019-06-20 NOTE — Telephone Encounter (Signed)
Copied from [place] [phone]. Topic: Quick Communication - Home Health Verbal Orders >> Jun 20, 2019 10:04 AM Jodie Echevaria wrote: Caller/Agency: [name] / [facility] Number: [phone] ok to leave message  Requesting OT/PT/Skilled Nursing/Social Work/Speech Therapy: PT  Frequency: 2 w 4, 1w 1

## 2019-06-20 NOTE — Telephone Encounter (Signed)
Okay for verbal orders? Please advise 

## 2019-06-23 NOTE — Telephone Encounter (Signed)
Please okay the orders  

## 2019-06-23 NOTE — Telephone Encounter (Signed)
Verbal orders given to Betsy.  

## 2019-06-23 NOTE — Telephone Encounter (Signed)
Left message to return phone call.

## 2019-07-02 ENCOUNTER — Telehealth: Payer: Self-pay | Admitting: *Deleted

## 2019-07-02 NOTE — Telephone Encounter (Signed)
Copied from [place] [phone]. Topic: General - Other >> Jul 02, 2019 11:25 AM Pauline Good wrote: Reason for CRM: pt weight today is 3 points out of range from Monday. Pt has her compression pumps. Pt didn't take second lasix yesterday, please call pt advise

## 2019-07-02 NOTE — Telephone Encounter (Signed)
Spoke to pt and she staed she did not take her lasix yesterday but she has taken it today. Pt stated she feels a lot better today and has been going frequently and knows that the fluid is off. Pt advised to call if anything changes.    FYI

## 2019-07-09 ENCOUNTER — Ambulatory Visit (INDEPENDENT_AMBULATORY_CARE_PROVIDER_SITE_OTHER): Payer: Medicare Other | Admitting: *Deleted

## 2019-07-09 DIAGNOSIS — I639 Cerebral infarction, unspecified: Secondary | ICD-10-CM

## 2019-07-10 LAB — CUP PACEART REMOTE DEVICE CHECK
Date Time Interrogation Session: 20201007120953
Implantable Pulse Generator Implant Date: 20181022

## 2019-07-10 NOTE — Telephone Encounter (Signed)
Okay for Rx?  

## 2019-07-10 NOTE — Telephone Encounter (Signed)
Olivia Mackie, from [facility], called and is requesting to have lymphoedema pump prescription sent in for pt. Please advise.   [phone]

## 2019-07-11 NOTE — Telephone Encounter (Signed)
I have already ordered compression pumps for her legs. I have filled out the paperwork, etc

## 2019-07-14 NOTE — Telephone Encounter (Signed)
Rx faxed

## 2019-07-15 ENCOUNTER — Ambulatory Visit: Payer: Medicare Other | Admitting: Podiatry

## 2019-07-15 NOTE — Telephone Encounter (Signed)
Olivia Mackie, rn with [facility], calling back regarding this message. She is requesting a call back.    Cb# [phone]

## 2019-07-16 NOTE — Telephone Encounter (Signed)
Spoke to [place] and she wanted to know if the requested compression socks order has been placed. I advised Olivia Mackie per previous message that it stated pt has compressions pumps. Olivia Mackie stated that the compression pumps were ordered off line and was of poor quality. Olivia Mackie asking for order to be sent. Routing to PCP for approval.

## 2019-07-16 NOTE — Telephone Encounter (Signed)
Rx sent to preferred pharmacy.

## 2019-07-16 NOTE — Telephone Encounter (Signed)
The order was signed.

## 2019-07-21 ENCOUNTER — Telehealth: Payer: Self-pay | Admitting: Family Medicine

## 2019-07-21 NOTE — Progress Notes (Signed)
[facility] Summary Report / Loop Recorder 

## 2019-07-21 NOTE — Telephone Encounter (Signed)
Dr. Elease Hashimoto please since Dr. Sarajane Jews is  Out of the office

## 2019-07-21 NOTE — Telephone Encounter (Signed)
oxyCODONE-acetaminophen (PERCOCET) 5-325 MG tablet     Patient is requesting refill. She inquired if she could get more than a weeks worth of medication at a time.

## 2019-07-21 NOTE — Telephone Encounter (Signed)
It does not sound like she is taking this daily.  She has been getting Percocet from multiple providers and I do not feel comfortable prescribing at this time.

## 2019-07-21 NOTE — Telephone Encounter (Signed)
Copied from [place] [id number]. Topic: Quick Communication - Home Health Verbal Orders >> Jul 21, 2019 12:52 PM Yvette Rack wrote: Caller/Agency: Betsy with [facility] Callback Number: [phone] Requesting OT/PT/Skilled Nursing/Social Work/Speech Therapy: PT   Frequency: continue PT 2 times a week for 5 weeks and 1 time a week for 3 weeks

## 2019-07-22 NOTE — Telephone Encounter (Signed)
Spoke with patient. Informed her that Dr. Sarajane Jews is out of the office this week and will back next week. Clinic RN informed patient that the covering provider does not feel comfortable refilling medication, and she will have to wait until Dr. Sarajane Jews returns next week.  Patient verbalized understanding.

## 2019-07-22 NOTE — Telephone Encounter (Signed)
Verbal orders given to

## 2019-07-23 NOTE — Telephone Encounter (Signed)
I called Betsy and informed her of the approval for verbal orders as below.

## 2019-07-23 NOTE — Telephone Encounter (Signed)
ok 

## 2019-07-28 NOTE — Telephone Encounter (Signed)
Noted  

## 2019-07-28 NOTE — Telephone Encounter (Signed)
I am happy to prescribe this, but she will need to start a pain management program with me to keep getting this. Set up an in person PMV so we can get a UDS, sign a contract, etc.

## 2019-08-01 ENCOUNTER — Telehealth: Payer: Self-pay | Admitting: Family Medicine

## 2019-08-01 MED ORDER — OXYCODONE-ACETAMINOPHEN 5-325 MG PO TABS: 2.0000 | ORAL_TABLET | ORAL | 0 refills | Status: AC | PRN

## 2019-08-01 NOTE — Telephone Encounter (Signed)
Pt has been set up with a PMV for Monday. Pt wants to know if she can have some for the weekend.

## 2019-08-01 NOTE — Telephone Encounter (Signed)
Pt notified of update.  

## 2019-08-01 NOTE — Telephone Encounter (Signed)
I sent in #30  

## 2019-08-01 NOTE — Telephone Encounter (Signed)
Pt is requesting Oxycodone refill for rotator cuff--is getting scheduled for an MRI by Dr Nelva Bush.   [facility] on [address]

## 2019-08-07 ENCOUNTER — Other Ambulatory Visit: Payer: Self-pay

## 2019-08-07 ENCOUNTER — Telehealth (INDEPENDENT_AMBULATORY_CARE_PROVIDER_SITE_OTHER): Payer: Medicare Other | Admitting: Family Medicine

## 2019-08-07 ENCOUNTER — Encounter: Payer: Self-pay | Admitting: Family Medicine

## 2019-08-07 DIAGNOSIS — F1190 Opioid use, unspecified, uncomplicated: Secondary | ICD-10-CM | POA: Diagnosis not present

## 2019-08-07 DIAGNOSIS — M150 Primary generalized (osteo)arthritis: Secondary | ICD-10-CM

## 2019-08-07 DIAGNOSIS — M8949 Other hypertrophic osteoarthropathy, multiple sites: Secondary | ICD-10-CM

## 2019-08-07 DIAGNOSIS — M159 Polyosteoarthritis, unspecified: Secondary | ICD-10-CM

## 2019-08-07 MED ORDER — OXYCODONE-ACETAMINOPHEN 10-325 MG PO TABS: 1.0000 | ORAL_TABLET | Freq: Four times a day (QID) | ORAL | 0 refills | Status: DC | PRN

## 2019-08-07 MED ORDER — PROMETHAZINE HCL 25 MG PO TABS: 25.0000 mg | ORAL_TABLET | ORAL | 5 refills | Status: DC | PRN

## 2019-08-07 NOTE — Progress Notes (Signed)
Virtual Visit via Telephone Note  I connected with the patient on 08/07/19 at 11:00 AM EST by telephone and verified that I am speaking with the correct person using two identifiers. We attempted to connect virtually but we had technical difficulties with the audio and video.     I discussed the limitations, risks, security and privacy concerns of performing an evaluation and management service by telephone and the availability of in person appointments. I also discussed with the patient that there may be a patient responsible charge related to this service. The patient expressed understanding and agreed to proceed.  Location patient: home Location provider: work or home office Participants present for the call: patient, provider Patient did not have a visit in the prior 7 days to address this/these issue(s).   History of Present Illness: Here for pain management. She still struggles with joint pains every day. She is seeing Dr. Nelva Bush now for some shoulder pain and has an MRI coming up soon.  Indication for chronic opioid: osteoarthritis Medication and dose: Percocet 10-325 # pills per month: 120 Last UDS date: 03-15-18 Opioid Treatment Agreement signed (Y/N): 03-15-18 Opioid Treatment Agreement last reviewed with patient:  08-07-19 NCCSRS reviewed this encounter (include red flags): Yes    Observations/Objective: Patient sounds cheerful and well on the phone. I do not appreciate any SOB. Speech and thought processing are grossly intact. Patient reported vitals:  Assessment and Plan: Pain management, meds were refilled.  Alysia Penna, MD   Follow Up Instructions:     [phone] 5-10 [phone] 11-20 [id number] 21-30 I did not refer this patient for an OV in the next 24 hours for this/these issue(s).  I discussed the assessment and treatment plan with the patient. The patient was provided an opportunity to ask questions and all were answered. The patient agreed with the plan and demonstrated  an understanding of the instructions.   The patient was advised to call back or seek an in-person evaluation if the symptoms worsen or if the condition fails to improve as anticipated.  I provided 18 minutes of non-face-to-face time during this encounter.   Alysia Penna, MD

## 2019-08-08 ENCOUNTER — Other Ambulatory Visit: Payer: Self-pay | Admitting: Family Medicine

## 2019-08-11 ENCOUNTER — Ambulatory Visit (INDEPENDENT_AMBULATORY_CARE_PROVIDER_SITE_OTHER): Payer: Medicare Other | Admitting: *Deleted

## 2019-08-11 DIAGNOSIS — I639 Cerebral infarction, unspecified: Secondary | ICD-10-CM | POA: Diagnosis not present

## 2019-08-12 LAB — CUP PACEART REMOTE DEVICE CHECK
Date Time Interrogation Session: 20201109152237
Implantable Pulse Generator Implant Date: 20181022

## 2019-08-14 ENCOUNTER — Telehealth: Payer: Self-pay | Admitting: Family Medicine

## 2019-08-14 NOTE — Telephone Encounter (Signed)
Pt called and stated that she will need an RX for a lymphedema pump sent over to [facility]   Fax# [phone]

## 2019-08-15 ENCOUNTER — Encounter: Payer: Self-pay | Admitting: Family Medicine

## 2019-08-15 DIAGNOSIS — I69311 Memory deficit following cerebral infarction: Secondary | ICD-10-CM

## 2019-08-15 DIAGNOSIS — I5032 Chronic diastolic (congestive) heart failure: Secondary | ICD-10-CM

## 2019-08-15 DIAGNOSIS — I890 Lymphedema, not elsewhere classified: Secondary | ICD-10-CM | POA: Insufficient documentation

## 2019-08-15 DIAGNOSIS — E1151 Type 2 diabetes mellitus with diabetic peripheral angiopathy without gangrene: Secondary | ICD-10-CM

## 2019-08-15 DIAGNOSIS — Z794 Long term (current) use of insulin: Secondary | ICD-10-CM

## 2019-08-15 DIAGNOSIS — J449 Chronic obstructive pulmonary disease, unspecified: Secondary | ICD-10-CM

## 2019-08-15 DIAGNOSIS — I69393 Ataxia following cerebral infarction: Secondary | ICD-10-CM

## 2019-08-15 DIAGNOSIS — I2510 Atherosclerotic heart disease of native coronary artery without angina pectoris: Secondary | ICD-10-CM | POA: Diagnosis not present

## 2019-08-15 DIAGNOSIS — I110 Hypertensive heart disease with heart failure: Secondary | ICD-10-CM | POA: Diagnosis not present

## 2019-08-15 DIAGNOSIS — E1142 Type 2 diabetes mellitus with diabetic polyneuropathy: Secondary | ICD-10-CM

## 2019-08-15 NOTE — Telephone Encounter (Signed)
Rx sent. Pt notified of update.

## 2019-08-15 NOTE — Telephone Encounter (Signed)
The rx is ready to fax  

## 2019-08-15 NOTE — Telephone Encounter (Signed)
Ok for Rx

## 2019-08-18 NOTE — Telephone Encounter (Signed)
Copied from [place] [id number]. Topic: General - Inquiry >> Aug 13, 2019  3:56 PM Berneta Levins wrote: Reason for CRM:  Angela Nevin from [facility] calling.  States they saw pt yesterday and pt told them she has a heart monitor.  Needs to know if this is correct or not and if so they will need medical notes faxed over. Fax number:  [phone] Angela Nevin can be reached at [phone]

## 2019-08-18 NOTE — Telephone Encounter (Signed)
Copied from [place] [phone]. Topic: General - Inquiry >> Aug 13, 2019  3:56 PM Berneta Levins wrote: Reason for CRM:  Angela Nevin from [facility] calling.  States they saw pt yesterday and pt told them she has a heart monitor.  Needs to know if this is correct or not and if so they will need medical notes faxed over. Fax number:  [phone] Angela Nevin can be reached at [phone]

## 2019-08-18 NOTE — Telephone Encounter (Signed)
Tried to called Angela Nevin and she was not available. Spoke to someone else who stated he couldn't help me. I advised when pt appt. Was to make sure nothing was needed and they gentleman stated he was not able to view that and that was at their scheduling separtment. Angela Nevin will be back in the a.m.

## 2019-08-19 ENCOUNTER — Other Ambulatory Visit: Payer: Self-pay | Admitting: Chiropractic Medicine

## 2019-08-19 ENCOUNTER — Telehealth: Payer: Self-pay | Admitting: Family Medicine

## 2019-08-19 ENCOUNTER — Other Ambulatory Visit (HOSPITAL_COMMUNITY): Payer: Self-pay | Admitting: Chiropractic Medicine

## 2019-08-19 DIAGNOSIS — M25511 Pain in right shoulder: Secondary | ICD-10-CM

## 2019-08-19 NOTE — Telephone Encounter (Signed)
[facility] received request for pt's medical supply. They are calling in to request the pt's face sheet, and insurance information faxed to them for pt's compression pump for her legs  Melanie Mcdonald Melanie Mcdonald [phone]   Phone: [phone]

## 2019-08-19 NOTE — Telephone Encounter (Signed)
Called back since someone advise Melanie Mcdonald would be in this a.m. but someone stated Melanie Mcdonald was not in today. Will call back tomorrow.

## 2019-08-19 NOTE — Telephone Encounter (Signed)
Information faxed

## 2019-08-26 ENCOUNTER — Telehealth: Payer: Self-pay | Admitting: Family Medicine

## 2019-08-26 ENCOUNTER — Telehealth: Payer: Self-pay | Admitting: *Deleted

## 2019-08-26 NOTE — Telephone Encounter (Signed)
° °  Oneta Rack 08/26/2019 12:02 PM  Summary: [facility] - Clinical Advice     Kathlee Nations wanted to add patient blood suagar was 45 today and insulin hemolog says to take 24 units 3x day. Patient has been given herself 20 units a day, in need of clarity, as per in need of sliding scale best # [phone] from [facility]    Copied from [place] [phone]. Topic: General - Other >> Aug 26, 2019 11:28 AM Rainey Pines A wrote: Nurse from [facility] would like clarification on if a compression pump was ordered for patient. Contact [phone]

## 2019-08-26 NOTE — Telephone Encounter (Signed)
See below message

## 2019-08-26 NOTE — Telephone Encounter (Signed)
Please advise 

## 2019-08-26 NOTE — Telephone Encounter (Signed)
Copied from [place] [phone]. Topic: General - Other >> Aug 26, 2019 11:28 AM Rainey Pines A wrote: Nurse from [facility] would like clarification on if a compression pump was ordered for patient. Contact [phone] >> Aug 26, 2019 12:02 PM Oneta Rack wrote: Kathlee Nations wanted to add patient blood suagar was 45 and insulin hemolog says to take 24 units 3x day. Patient has been given herself 20 units a day, in need of clarity, as per in need of sliding scale best # [phone] from [facility]

## 2019-08-27 NOTE — Telephone Encounter (Signed)
Spoke to [place] and informed of update. Faryal stated she will call pt with update. Ronique stated she did receive the Rx for the stockings last week. Han stated that [facility] needs more information that they will send to our office.

## 2019-08-27 NOTE — Telephone Encounter (Signed)
(  1) decrease the insulin dose to 10 units TID before meals since I doubt she could handle a sliding scale, and (2) yes I did order the compression pumps

## 2019-08-27 NOTE — Telephone Encounter (Signed)
This has been taking care of. See other phone note from 08/27/2019.

## 2019-09-01 ENCOUNTER — Other Ambulatory Visit: Payer: Self-pay | Admitting: Family Medicine

## 2019-09-05 ENCOUNTER — Telehealth: Payer: Self-pay

## 2019-09-05 ENCOUNTER — Ambulatory Visit (HOSPITAL_COMMUNITY)
Admission: RE | Admit: 2019-09-05 | Discharge: 2019-09-05 | Disposition: A | Discharge status: Home / Self Care | Payer: Medicare Other | Source: Ambulatory Visit | Attending: Chiropractic Medicine | Admitting: Chiropractic Medicine

## 2019-09-05 ENCOUNTER — Other Ambulatory Visit (INDEPENDENT_AMBULATORY_CARE_PROVIDER_SITE_OTHER): Payer: Medicare Other

## 2019-09-05 ENCOUNTER — Other Ambulatory Visit: Payer: Self-pay

## 2019-09-05 ENCOUNTER — Other Ambulatory Visit: Payer: Medicare Other

## 2019-09-05 DIAGNOSIS — M25511 Pain in right shoulder: Secondary | ICD-10-CM | POA: Diagnosis present

## 2019-09-05 DIAGNOSIS — N390 Urinary tract infection, site not specified: Secondary | ICD-10-CM

## 2019-09-05 LAB — POCT URINALYSIS DIPSTICK
Glucose, UA: POSITIVE — AB
Protein, UA: NEGATIVE
Spec Grav, UA: >=1.03 — AB (ref 1.010–1.025)
Urobilinogen, UA: 0.2 E.U./dL
pH, UA: 6 (ref 5.0–8.0)

## 2019-09-05 NOTE — Telephone Encounter (Signed)
Pt walked into the office with urine sample. Per lab pt stated she was told to bring urine in. There were no notes reflecting this and Dr.Fry stated he did not tell pt to bring this in. Called pt 3 times to see what was going on but no answer. We will run a urinalysis and culture.

## 2019-09-06 LAB — URINE CULTURE
MICRO NUMBER:: 1165049
Result:: NO GROWTH
SPECIMEN QUALITY:: ADEQUATE

## 2019-09-09 NOTE — Telephone Encounter (Signed)
Derek with [facility] is calling with question about the RX for the compression pump for pt.

## 2019-09-09 NOTE — Telephone Encounter (Signed)
Please advise 

## 2019-09-09 NOTE — Telephone Encounter (Signed)
Called pt to see if she received the supplies. Pt stated she has not received anything. Will call [facility] to see what the issue is.

## 2019-09-09 NOTE — Telephone Encounter (Signed)
Patient called and would like to speak to someone regarding the urine sample she left at the office. She said she spoke with a CMA but cant recall her name and told her she could come and drop it off. Please call patient back, thanks.

## 2019-09-10 NOTE — Telephone Encounter (Signed)
Last message entered in error. Called and Left message for Melanie Mcdonald to return call.

## 2019-09-10 NOTE — Progress Notes (Signed)
[facility] Summary Report / Loop Recorder 

## 2019-09-10 NOTE — Telephone Encounter (Signed)
Left detailed message informing  of update. 

## 2019-09-10 NOTE — Telephone Encounter (Signed)
PT stated that this has already been taking care of. No further action needed!

## 2019-09-12 ENCOUNTER — Ambulatory Visit: Payer: Medicare Other | Admitting: Podiatry

## 2019-09-12 ENCOUNTER — Other Ambulatory Visit: Payer: Self-pay

## 2019-09-12 ENCOUNTER — Ambulatory Visit (INDEPENDENT_AMBULATORY_CARE_PROVIDER_SITE_OTHER): Payer: Medicare Other | Admitting: *Deleted

## 2019-09-12 DIAGNOSIS — I639 Cerebral infarction, unspecified: Secondary | ICD-10-CM

## 2019-09-12 DIAGNOSIS — L97411 Non-pressure chronic ulcer of right heel and midfoot limited to breakdown of skin: Secondary | ICD-10-CM

## 2019-09-14 LAB — CUP PACEART REMOTE DEVICE CHECK
Date Time Interrogation Session: 20201212101932
Implantable Pulse Generator Implant Date: 20181022

## 2019-09-15 ENCOUNTER — Other Ambulatory Visit: Payer: Self-pay

## 2019-09-15 ENCOUNTER — Telehealth: Payer: Self-pay | Admitting: Family Medicine

## 2019-09-15 ENCOUNTER — Ambulatory Visit: Payer: Self-pay | Admitting: *Deleted

## 2019-09-15 ENCOUNTER — Telehealth (INDEPENDENT_AMBULATORY_CARE_PROVIDER_SITE_OTHER): Payer: Medicare Other | Admitting: Family Medicine

## 2019-09-15 ENCOUNTER — Encounter: Payer: Self-pay | Admitting: Family Medicine

## 2019-09-15 DIAGNOSIS — M7989 Other specified soft tissue disorders: Secondary | ICD-10-CM

## 2019-09-15 DIAGNOSIS — I5022 Chronic systolic (congestive) heart failure: Secondary | ICD-10-CM | POA: Diagnosis not present

## 2019-09-15 NOTE — Telephone Encounter (Signed)
This has been taking care of.

## 2019-09-15 NOTE — Telephone Encounter (Signed)
Pt called stating her legs are swollen, and leaking fluid; this episode leaking started on 09/12/2019; she has taken has missed doses of her "fluid pills" since 09/12/2019 due to her schedule; her legs are swollen from her knee down from her feet, and she can not wear any of her shoes; the pt says she has CHF, and has gained for weight, but she has not weighed; the pt does have SOB with conversation and exertion; the pt also fell PM 09/14/2019 and she is not sure how she fell between the bed and night stand; recommendations made per nurse triage protocol' the pt states "absolutely not"; she sees Dr Sarajane Jews, [facility]; pt transferred to [facility] for final disposition.   Reason for Disposition . [1] Difficulty breathing with exertion (e.g., walking) AND [2] new onset or worsening  Answer Assessment - Initial Assessment Questions 1. ONSET: "When did the swelling start?" (e.g., minutes, hours, days)     09/12/2019 2. LOCATION: "What part of the leg is swollen?"  "Are both legs swollen or just one leg?"  bil legs knees to feet 3. SEVERITY: "How bad is the swelling?" (e.g., localized; mild, moderate, severe)  - Localized - small area of swelling localized to one leg  - MILD pedal edema - swelling limited to foot and ankle, pitting edema < 1/4 inch (6 mm) deep, rest and elevation eliminate most or all swelling  - MODERATE edema - swelling of lower leg to knee, pitting edema > 1/4 inch (6 mm) deep, rest and elevation only partially reduce swelling  - SEVERE edema - swelling extends above knee, facial or hand swelling present      moderate 4. REDNESS: "Does the swelling look red or infected?"     red 5. PAIN: "Is the swelling painful to touch?" If so, ask: "How painful is it?"   (Scale 1-10; mild, moderate or severe)     no 6. FEVER: "Do you have a fever?" If so, ask: "What is it, how was it measured, and when did it start?"     no 7. CAUSE: "What do you think is causing the leg swelling?"    Ongoing  for years 8. MEDICAL HISTORY: "Do you have a history of heart failure, kidney disease, liver failure, or cancer?"     chf 9. RECURRENT SYMPTOM: "Have you had leg swelling before?" If so, ask: "When was the last time?" "What happened that time?"     Yes ongoing for years 10. OTHER SYMPTOMS: "Do you have any other symptoms?" (e.g., chest pain, difficulty breathing)      sob 11. PREGNANCY: "Is there any chance you are pregnant?" "When was your last menstrual period?"      no  Protocols used: LEG SWELLING AND EDEMA-A-AH

## 2019-09-15 NOTE — Progress Notes (Signed)
Virtual Visit via Telephone Note  I connected with the patient on 09/15/19 at  8:45 AM EST by telephone and verified that I am speaking with the correct person using two identifiers. We attempted to connect virtually but we had technical difficulties with the audio and video.     I discussed the limitations, risks, security and privacy concerns of performing an evaluation and management service by telephone and the availability of in person appointments. I also discussed with the patient that there may be a patient responsible charge related to this service. The patient expressed understanding and agreed to proceed.  Location patient: home Location provider: work or home office Participants present for the call: patient, provider Patient did not have a visit in the prior 7 days to address this/these issue(s).   History of Present Illness: Here to ask about swelling in the legs and oozing of clear fluid through the skin of the legs. This then drips down her legs and onto her bedsheets. She is supposed to be taking Lasix 40 mg 2 tabs in the am and one tab in the pm. However shehas not been taking the evening dose. She is elevating the legs. She is mildly SOB on exertion.    Observations/Objective: Patient sounds cheerful and well on the phone. I do not appreciate any SOB. Speech and thought processing are grossly intact. Patient reported vitals:  Assessment and Plan: CHF. I again advised her to take the Lasix as prescribed. She may also use Gold Bond powder on the legs since this can crust up to slow the fluid loss. Recheck prn.  Alysia Penna, MD   Follow Up Instructions:     [phone] 5-10 [phone] 11-20 [phone] 21-30 I did not refer this patient for an OV in the next 24 hours for this/these issue(s).  I discussed the assessment and treatment plan with the patient. The patient was provided an opportunity to ask questions and all were answered. The patient agreed with the plan and  demonstrated an understanding of the instructions.   The patient was advised to call back or seek an in-person evaluation if the symptoms worsen or if the condition fails to improve as anticipated.  I provided 13 minutes of non-face-to-face time during this encounter.   Alysia Penna, MD

## 2019-09-15 NOTE — Telephone Encounter (Signed)
Pt called this morning and states she fell last night and her legs are swollen and leaking a clear fluid that they have leaked before years ago. Pt asked for Dr. Sarajane Jews to send in something for this/ please advise

## 2019-09-16 NOTE — Telephone Encounter (Signed)
Left message to return phone call.

## 2019-09-17 ENCOUNTER — Telehealth: Payer: Self-pay | Admitting: Family Medicine

## 2019-09-17 NOTE — Telephone Encounter (Signed)
Order has been received. Disposition: Dr. Murrell Redden folder.

## 2019-09-17 NOTE — Telephone Encounter (Signed)
Message Routed to PCP CMA 

## 2019-09-17 NOTE — Telephone Encounter (Signed)
Melanie Mcdonald called to check if order request were received for nursing/ please advise   Order# [id number]   Order# [id number]

## 2019-09-17 NOTE — Telephone Encounter (Signed)
Order # [id number] received and faxed back.  Will call to see if order# [id number] can be re-faxed.

## 2019-09-23 NOTE — Telephone Encounter (Signed)
Orders have been completed and faxed back.

## 2019-09-30 ENCOUNTER — Telehealth: Payer: Self-pay

## 2019-09-30 NOTE — Telephone Encounter (Signed)
Patient called. She thinks she has a piece of glass in the bottom of her foot and needs to come in. She only has a ride on Wednesday. You will be in surgery, Dr. Posey Pronto is more than full for the morning, so we are trying to bring her in with Dr Amalia Hailey in the afternoon. Just FYI Thanks, [employer]

## 2019-10-01 ENCOUNTER — Ambulatory Visit: Payer: Medicare Other | Admitting: Podiatry

## 2019-10-05 NOTE — Telephone Encounter (Signed)
Noted thanks °

## 2019-10-05 NOTE — Progress Notes (Signed)
Subjective:  Patient ID: Melanie Mcdonald, female    DOB: Sep 27, 1933,  MRN: [id number]  Chief Complaint  Patient presents with  . Foot Problem    Pt states painful spot left foot sub medial ankle "Was an abcess years ago but Dr. Jacqualyn Posey drained it."    86 y.o. female presents with the above complaint. Hx as above.  States that Dr. Milford Cage previously had drained purulence from this area and is concerned that this has recurred denies redness denies warmth but has a hard callus at the area  Review of Systems: Negative except as noted in the HPI. Denies N/V/F/Ch.  Past Medical History:  Diagnosis Date  . Abdominal pain, unspecified site 03/09/2014  . Acute on chronic diastolic CHF (congestive heart failure), NYHA class 3 ([place]) 11/03/2014   Acute on chronic diastolic heart failure  . Acute respiratory failure ([facility]) 06/17/2018  . ANEMIA 01/30/2008  . ARTHRITIS 12/11/2007  . BACK PAIN, CHRONIC 06/23/2008  . Backache 06/23/2008   Qualifier: Diagnosis of  By: Niel Hummer MD, [place] CYST, RIGHT 12/17/2007  . Cancer ([place])   . Candidiasis of genitalia in female 07/20/2017  . Cellulitis of left leg 08/14/2011  . Cellulitis of right foot 06/13/2016  . Cervicogenic headache 06/14/2015  . Chest pain 06/08/2011   Atypical chest pain  . Chest pain 07/20/2017  . CHF (congestive heart failure) ([place])   . CHF (congestive heart failure), NYHA class II, chronic, systolic ([place]) 123456  . Colon polyps    FRAGMENTS OF HYPERPLASTIC POLYP  . CONTACT DERMATITIS 03/10/2009  . COPD (chronic obstructive pulmonary disease) ([place])   . CORONARY ARTERY DISEASE 03/01/2007   had a normal Myoview stress test 07-13-11  . Coronary atherosclerosis 03/01/2007   Qualifier: Diagnosis of  By: Cori Razor RN, Mikal Plane   . CVA (cerebral vascular accident) ([facility]) 07/20/2017  . CYSTOCELE WITHOUT MENTION UTERINE PROLAPSE LAT 09/12/2010  . DEGENERATIVE JOINT DISEASE 08/07/2007  . DEPRESSION 03/01/2007  . Depression, recurrent ([place])  03/01/2007   Qualifier: Diagnosis of  By: Cori Razor RN, Mikal Plane   . DIABETES MELLITUS, TYPE II 08/26/2008  . Diabetic polyneuropathy associated with diabetes mellitus due to underlying condition ([facility] [facility]) 06/14/2015  . Diarrhea 08/15/2011  . Diverticulosis of colon (without mention of hemorrhage)   . Dizziness 02/10/2014  . DOE (dyspnea on exertion) 11/03/2014  . Edema 09/02/2008   Centricity Description: EDEMA Qualifier: Diagnosis of  By: Niel Hummer MD, Lorinda Creed  Centricity Description: PERIPHERAL EDEMA Qualifier: Diagnosis of  By: Niel Hummer MD, Lorinda Creed   . Esophageal reflux 11/06/2007  . Essential hypertension 03/01/2007   Qualifier: Diagnosis of  By: Cori Razor RN, Mikal Plane   . Gait instability 11/13/2014  . Gout 06/07/2016  . Headache 12/30/2013  . HIP PAIN 09/15/2010   Qualifier: Diagnosis of  By: Elease Hashimoto MD, [place]    . HYPERKERATOSIS 06/02/2009  . HYPERLIPIDEMIA 08/26/2008  . Hyperlipidemia 08/26/2008   Qualifier: Diagnosis of  By: Cori Razor RN, Mikal Plane HYPERTENSION 03/01/2007  . Hypokalemia 08/15/2011  . Insomnia 12/11/2007  . Irritable bowel syndrome 01/26/2009  . LABYRINTHITIS 11/03/2009  . Labyrinthitis 11/03/2009   Qualifier: Diagnosis of  By: Niel Hummer MD, Lorinda Creed   . Leg swelling 04/05/2016  . Memory loss 12/30/2013  . Narcolepsy without cataplexy 08/26/2008   Qualifier: Diagnosis of  By: Niel Hummer MD, [place] Narcolepsy without cataplexy(347.00) 08/26/2008  . NEPHROLITHIASIS 04/29/2009  . OBESITY 03/01/2007  .  Obstructive apnea 11/03/2014  . Osteoarthritis 03/15/2018  . Pain in joint, ankle and foot 12/24/2015  . PERIPHERAL VASCULAR DISEASE 01/26/2009  . Peripheral vascular disease ([facility]) 01/26/2009   Qualifier: Diagnosis of  By: Niel Hummer MD, [place] history of colonic polyps 03/09/2014  . Stroke ([place])   . SYNCOPE 08/26/2008   had brain MRI on 06-28-12 showing only chronic microvascular ischemia and atrophy   . SYNDROME, RESTLESS LEGS 03/01/2007   Qualifier: Diagnosis of   By: Cori Razor RN, Mikal Plane Thyroid nodule 04/28/2014  . TIA (transient ischemic attack) 11/13/2014  . TRANSIENT ISCHEMIC ATTACK 10/20/2009   had normal brain MRA with patent vertebrals and carotids 06-28-12  . Uncontrolled type 2 diabetes mellitus with hyperglycemia, with long-term current use of insulin ([place]) 10/09/2016  . Weakness generalized 11/13/2014    Current Outpatient Medications:  .  clopidogrel (PLAVIX) 75 MG tablet, TAKE 1 TABLET BY MOUTH EVERY DAY, Disp: 90 tablet, Rfl: 0 .  Continuous Blood Gluc Receiver (FREESTYLE LIBRE 14 DAY READER) DEVI, 1 Device by Does not apply route daily. Use device daily as directed, Disp: 1 Device, Rfl: 0 .  Continuous Blood Gluc Sensor (FREESTYLE LIBRE 14 DAY SENSOR) MISC, 1 application by Does not apply route every 14 (fourteen) days., Disp: 2 each, Rfl: 6 .  diclofenac Sodium (VOLTAREN) 1 % GEL, Apply 2 g topically 4 (four) times daily., Disp: , Rfl:  .  furosemide (LASIX) 40 MG tablet, Take 2 tablets in the morning and 1 tablet in the evening (Patient taking differently: Take 40-80 mg by mouth See admin instructions. Take 2 tablets by mouth in the morning and 1 tablet in the evening), Disp: 60 tablet, Rfl: 11 .  gabapentin (NEURONTIN) 300 MG capsule, Take 1 capsule (300 mg total) by mouth 3 (three) times daily., Disp: 90 capsule, Rfl: 5 .  glucose blood (ONE TOUCH ULTRA TEST) test strip, 1 each by Other route as needed for other. Use as instructed, Disp: 100 each, Rfl: 1 .  HUMALOG KWIKPEN 100 UNIT/ML KwikPen, Inject 0.2 mLs (20 Units total) into the skin 3 (three) times daily before meals., Disp: 45 mL, Rfl: 5 .  HYDROcodone-homatropine (HYDROMET) 5-1.5 MG/5ML syrup, Take 5 mLs by mouth every 4 (four) hours as needed., Disp: 240 mL, Rfl: 0 .  insulin degludec (TRESIBA FLEXTOUCH) 100 UNIT/ML SOPN FlexTouch Pen, Inject 0.8 mLs (80 Units total) into the skin every morning., Disp: 9 pen, Rfl: 5 .  Insulin Pen Needle (NOVOFINE) 32G X 6 MM MISC, CHECK BLOOD  GLUCOSE THREE TIMES DAILY, Disp: 100 each, Rfl: 0 .  levofloxacin (LEVAQUIN) 500 MG tablet, Take 1 tablet (500 mg total) by mouth daily., Disp: 10 tablet, Rfl: 0 .  meclizine (ANTIVERT) 25 MG tablet, Take 1 tablet (25 mg total) by mouth 3 (three) times daily as needed for dizziness., Disp: 60 tablet, Rfl: 5 .  mupirocin ointment (BACTROBAN) 2 %, mupirocin 2 % topical ointment  APP TO SORE ON RIGHT ARM BID FOR 2 WKS, Disp: , Rfl:  .  nitroGLYCERIN (NITROSTAT) 0.4 MG SL tablet, Place 0.4 mg under the tongue every 5 (five) minutes as needed for chest pain., Disp: , Rfl:  .  [START ON 10/07/2019] oxyCODONE-acetaminophen (PERCOCET) 10-325 MG tablet, Take 1 tablet by mouth every 6 (six) hours as needed for pain., Disp: 120 tablet, Rfl: 0 .  phenazopyridine (PYRIDIUM) 200 MG tablet, Take 1 tablet (200 mg total) by mouth 3 (three) times daily as  needed (urinary pain)., Disp: 30 tablet, Rfl: 0 .  pramipexole (MIRAPEX) 1 MG tablet, TAKE 4 TABLETS BY MOUTH ONCE DAILY, Disp: 368 tablet, Rfl: 1 .  promethazine (PHENERGAN) 25 MG tablet, Take 1 tablet (25 mg total) by mouth every 4 (four) hours as needed for nausea or vomiting., Disp: 60 tablet, Rfl: 5 .  spironolactone (ALDACTONE) 25 MG tablet, TAKE 1 TABLET(25 MG) BY MOUTH TWICE DAILY, Disp: 90 tablet, Rfl: 1 .  terbinafine (LAMISIL) 250 MG tablet, Take 1 tablet (250 mg total) by mouth daily., Disp: 90 tablet, Rfl: 1 .  zolpidem (AMBIEN) 10 MG tablet, TAKE 1 TABLET(10 MG) BY MOUTH AT BEDTIME AS NEEDED FOR SLEEP, Disp: 30 tablet, Rfl: 5  Social History   Tobacco Use  Smoking Status Never Smoker  Smokeless Tobacco Never Used    Allergies  Allergen Reactions  . Lyrica [Pregabalin] Other (See Comments)    Suicidal   . Aspirin Other (See Comments)    Abdominal pain  . Ciprofloxacin Nausea And Vomiting    Made pt very sick on stomach  . Clarithromycin Other (See Comments)    Made stomach "burn"  . Doxycycline Itching  . Erythromycin Nausea And Vomiting  .  Lisinopril Cough  . Macrobid [Nitrofurantoin Monohyd Macro] Nausea And Vomiting  . Metolazone Other (See Comments)    Pt stated this made her B/P drop  . Other Nausea And Vomiting    Pt cannot have any of the -mycin(s)  . Penicillins Swelling    From childhood: swelling @ injection site,childhood allergy Has patient had a PCN reaction causing immediate rash, facial/tongue/throat swelling, SOB or lightheadedness with hypotension: Yes Has patient had a PCN reaction causing severe rash involving mucus membranes or skin necrosis: No Has patient had a PCN reaction that required hospitalization: No Has patient had a PCN reaction occurring within the last 10 years: No If all of the above answers are "NO", then may proceed with Cephalosporin use.  . Sulfonamide Derivatives Nausea And Vomiting  . Valtrex [Valacyclovir Hcl] Nausea Only   Objective:  There were no vitals filed for this visit. There is no height or weight on file to calculate BMI. Constitutional Well developed. Well nourished.  Vascular Dorsalis pedis pulses palpable bilaterally. Posterior tibial pulses palpable bilaterally. Capillary refill normal to all digits.  No cyanosis or clubbing noted. Pedal hair growth normal.  Neurologic Normal speech. Oriented to person, place, and time. Epicritic sensation to light touch grossly diminished bilaterally.  Dermatologic  wound at the medial aspect of the right heel with hyperkeratosis no warmth no erythema no signs of acute infection  Orthopedic: Hallux valgus right with second toe hammertoe deformity   Radiographs: Severe HAV deformity second hammertoe deformity Assessment:   No diagnosis found. Plan:  Patient was evaluated and treated and all questions answered.  Ulcer of heel -lesion carefully debrided only small open ulceration noted, dressed with antibiotic ointment and band-aid.  Discussed with patient that I do not think she has any purulent pocket at the area and I would  not recommend aspirating it  Return in about 3 weeks (around 10/03/2019) for Wound check left heel .

## 2019-10-06 ENCOUNTER — Other Ambulatory Visit: Payer: Self-pay | Admitting: Family Medicine

## 2019-10-13 ENCOUNTER — Other Ambulatory Visit: Payer: Self-pay | Admitting: Family Medicine

## 2019-10-13 DIAGNOSIS — I5033 Acute on chronic diastolic (congestive) heart failure: Secondary | ICD-10-CM

## 2019-10-13 DIAGNOSIS — G4730 Sleep apnea, unspecified: Secondary | ICD-10-CM

## 2019-10-13 DIAGNOSIS — I10 Essential (primary) hypertension: Secondary | ICD-10-CM

## 2019-10-16 ENCOUNTER — Ambulatory Visit (INDEPENDENT_AMBULATORY_CARE_PROVIDER_SITE_OTHER): Payer: Medicare PPO | Admitting: *Deleted

## 2019-10-16 DIAGNOSIS — I639 Cerebral infarction, unspecified: Secondary | ICD-10-CM

## 2019-10-16 LAB — CUP PACEART REMOTE DEVICE CHECK
Date Time Interrogation Session: 20210114110544
Implantable Pulse Generator Implant Date: 20181022

## 2019-10-20 DIAGNOSIS — M25561 Pain in right knee: Secondary | ICD-10-CM | POA: Diagnosis not present

## 2019-10-20 DIAGNOSIS — M1712 Unilateral primary osteoarthritis, left knee: Secondary | ICD-10-CM | POA: Diagnosis not present

## 2019-10-20 DIAGNOSIS — M1711 Unilateral primary osteoarthritis, right knee: Secondary | ICD-10-CM | POA: Diagnosis not present

## 2019-10-20 DIAGNOSIS — M25562 Pain in left knee: Secondary | ICD-10-CM | POA: Diagnosis not present

## 2019-10-20 DIAGNOSIS — M170 Bilateral primary osteoarthritis of knee: Secondary | ICD-10-CM | POA: Diagnosis not present

## 2019-10-20 DIAGNOSIS — E109 Type 1 diabetes mellitus without complications: Secondary | ICD-10-CM | POA: Diagnosis not present

## 2019-10-21 ENCOUNTER — Other Ambulatory Visit: Payer: Self-pay

## 2019-10-21 ENCOUNTER — Telehealth (INDEPENDENT_AMBULATORY_CARE_PROVIDER_SITE_OTHER): Payer: Medicare PPO | Admitting: Family Medicine

## 2019-10-21 ENCOUNTER — Telehealth: Payer: Self-pay | Admitting: Family Medicine

## 2019-10-21 ENCOUNTER — Ambulatory Visit: Payer: Self-pay | Admitting: Family Medicine

## 2019-10-21 DIAGNOSIS — J019 Acute sinusitis, unspecified: Secondary | ICD-10-CM | POA: Diagnosis not present

## 2019-10-21 MED ORDER — OXYCODONE-ACETAMINOPHEN 10-325 MG PO TABS: 1.0000 | ORAL_TABLET | ORAL | 0 refills | Status: AC | PRN

## 2019-10-21 MED ORDER — MECLIZINE HCL 25 MG PO TABS: 25.0000 mg | ORAL_TABLET | Freq: Three times a day (TID) | ORAL | 5 refills | Status: DC | PRN

## 2019-10-21 MED ORDER — CEPHALEXIN 500 MG PO CAPS: 500.0000 mg | ORAL_CAPSULE | Freq: Three times a day (TID) | ORAL | 0 refills | Status: AC

## 2019-10-21 NOTE — Progress Notes (Signed)
Virtual Visit via Telephone Note  I connected with the patient on 10/21/19 at  3:30 PM EST by telephone and verified that I am speaking with the correct person using two identifiers.   I discussed the limitations, risks, security and privacy concerns of performing an evaluation and management service by telephone and the availability of in person appointments. I also discussed with the patient that there may be a patient responsible charge related to this service. The patient expressed understanding and agreed to proceed.  Location patient: home Location provider: work or home office Participants present for the call: patient, provider Patient did not have a visit in the prior 7 days to address this/these issue(s).   History of Present Illness: Here for what she thinks is a sinus infection. For the past week she has had sinus congestion, blowing green mucus for the nose, mild pain in the right ear, and dizziness. She has had vertigo off and on for years. No fever or cough or SOB. Her BP has been stable in the 120s over 60s. Her random glucoses have been stable in the range of 130-160.    Observations/Objective: Patient sounds cheerful and well on the phone. I do not appreciate any SOB. Speech and thought processing are grossly intact. Patient reported vitals:  Assessment and Plan: Sinusitis, treat with Keflex. Use Meclizine as needed.  Alysia Penna, MD   Follow Up Instructions:     [phone] [phone] 11-20 [phone] 21-30 I did not refer this patient for an OV in the next 24 hours for this/these issue(s).  I discussed the assessment and treatment plan with the patient. The patient was provided an opportunity to ask questions and all were answered. The patient agreed with the plan and demonstrated an understanding of the instructions.   The patient was advised to call back or seek an in-person evaluation if the symptoms worsen or if the condition fails to improve as anticipated.  I  provided 14 minutes of non-face-to-face time during this encounter.   Alysia Penna, MD

## 2019-10-21 NOTE — Telephone Encounter (Signed)
Pt blacked out and fell on Friday and she woke up to the fire dept putting her in bed. Since then Pt has had a bad headache and pt also has a sinus infection with nose bleeds/Pt has also been experiencing dizziness and last BP was 122/63 with a pulse of 83/ Pt would like a call for the nurse

## 2019-10-21 NOTE — Telephone Encounter (Signed)
Pt initially calling to report dizziness. Then shared she had fallen Friday night, does not recall events, "I was on the floor near my bed and firemen were picking me up." Was not transported to ED. Does not recall events. H/O TIAs.  States lightheadedness began 1 week ago "But not dizzy Friday night." States not spinning, lightheaded only with bending over, turning head. Also reports earache both ears, facial tenderness and "Dark yellow " nasal drainage. States afebrile, BP 122/63, HR 83. Reports BS 150 this am. Pt denies any injury from fall, "Just sore all over." Also reports left shoulder sore, has appt for "Rotator cuff consult, always painful."  Call transferred to practice, Arbie Cookey, for consideration of appt this afternoon. Care advise given to pt, verbalizes understanding.   Reason for Disposition . [1] MODERATE dizziness (e.g., interferes with normal activities) AND [2] has NOT been evaluated by physician for this  (Exception: dizziness caused by heat exposure, sudden standing, or poor fluid intake)  Answer Assessment - Initial Assessment Questions 1. DESCRIPTION: "Describe your dizziness."     Lightheaded, no spinning 2. LIGHTHEADED: "Do you feel lightheaded?" (e.g., somewhat faint, woozy, weak upon standing)     Yes 3. VERTIGO: "Do you feel like either you or the room is spinning or tilting?" (i.e. vertigo)     No 4. SEVERITY: "How bad is it?"  "Do you feel like you are going to faint?" "Can you stand and walk?"   - MILD - walking normally   - MODERATE - interferes with normal activities (e.g., work, school)    - SEVERE - unable to stand, requires support to walk, feels like passing out now.      Mild today with bending or turning head. 5. ONSET:  "When did the dizziness begin?"     1 week ago 6. AGGRAVATING FACTORS: "Does anything make it worse?" (e.g., standing, change in head position)     Turning head 7. HEART RATE: "Can you tell me your heart rate?" "How many beats in 15 seconds?"   (Note: not all patients can do this)       83 8. CAUSE: "What do you think is causing the dizziness?"     Sinus infection 9. RECURRENT SYMPTOM: "Have you had dizziness before?" If so, ask: "When was the last time?" "What happened that time?"     Not this bad 10. OTHER SYMPTOMS: "Do you have any other symptoms?" (e.g., fever, chest pain, vomiting, diarrhea, bleeding)      "Sinus infection", fell Friday night, does not recall what happened  Protocols used: DIZZINESS [facility]

## 2019-10-21 NOTE — Telephone Encounter (Signed)
Patient has a virtual appointment with Dr. Sarajane Jews today. Will send as FYI

## 2019-10-21 NOTE — Telephone Encounter (Signed)
Patient has an appointment 10/21/2019 at 3:30PM  with PCP

## 2019-10-22 ENCOUNTER — Telehealth: Payer: Self-pay | Admitting: Family Medicine

## 2019-10-22 NOTE — Telephone Encounter (Signed)
Pt called in stated that she lost her Continuous Blood Gluc Receiver (FREESTYLE LIBRE 40 DAY READER) DEVI  She would like to know if Dr fry could call another in for her?  She lost hers and needs another one called in

## 2019-10-22 NOTE — Telephone Encounter (Signed)
Message Routed to PCP CMA 

## 2019-10-23 MED ORDER — FREESTYLE LIBRE 14 DAY READER DEVI: 1.0000 | Freq: Every day | 0 refills | Status: DC

## 2019-10-23 NOTE — Telephone Encounter (Signed)
Rx has been sent in. Patient is aware. 

## 2019-10-24 ENCOUNTER — Telehealth: Payer: Self-pay | Admitting: Family Medicine

## 2019-10-24 MED ORDER — FREESTYLE LIBRE 14 DAY READER DEVI: 1.0000 | Freq: Every day | 0 refills | Status: DC

## 2019-10-24 NOTE — Telephone Encounter (Signed)
Rx has been re-sent to the correct pharmacy. Please advise on leg pain.

## 2019-10-24 NOTE — Telephone Encounter (Signed)
MEDICATION: Meter for 14 day freestyle -LIBRA  PHARMACY: [facility] [id number] - [place], [place] - [address] AT [facility] [address]  Comments: Also patient states she is having some throbbing pain within her legs and the Oxycodone has not helped and would like to know what else she could take.   **Let patient know to contact pharmacy at the end of the day to make sure medication is ready. **  ** Please notify patient to allow 48-72 hours to process**  **Encourage patient to contact the pharmacy for refills or they can request refills through [facility]**

## 2019-10-24 NOTE — Telephone Encounter (Signed)
She will need an OV to evaluate this (in person)

## 2019-10-27 NOTE — Telephone Encounter (Signed)
Left message for patient to call back. CRM created 

## 2019-10-28 DIAGNOSIS — M170 Bilateral primary osteoarthritis of knee: Secondary | ICD-10-CM | POA: Diagnosis not present

## 2019-10-28 DIAGNOSIS — M1712 Unilateral primary osteoarthritis, left knee: Secondary | ICD-10-CM | POA: Diagnosis not present

## 2019-10-28 DIAGNOSIS — M1711 Unilateral primary osteoarthritis, right knee: Secondary | ICD-10-CM | POA: Diagnosis not present

## 2019-10-28 NOTE — Telephone Encounter (Signed)
Left message for patient to call back  

## 2019-10-29 ENCOUNTER — Telehealth: Payer: Self-pay | Admitting: Family Medicine

## 2019-10-29 NOTE — Telephone Encounter (Signed)
Pt states that her pharmacy has not received an authorization for the METER-LIBRA. Pt would like the refill to be sent to [facility] ([employer])

## 2019-10-30 NOTE — Telephone Encounter (Signed)
Rx has been sent in. Patient is aware. 

## 2019-10-31 ENCOUNTER — Telehealth (INDEPENDENT_AMBULATORY_CARE_PROVIDER_SITE_OTHER): Payer: Medicare PPO | Admitting: Family Medicine

## 2019-10-31 ENCOUNTER — Other Ambulatory Visit: Payer: Self-pay

## 2019-10-31 DIAGNOSIS — R300 Dysuria: Secondary | ICD-10-CM

## 2019-10-31 DIAGNOSIS — Z794 Long term (current) use of insulin: Secondary | ICD-10-CM | POA: Diagnosis not present

## 2019-10-31 DIAGNOSIS — E1165 Type 2 diabetes mellitus with hyperglycemia: Secondary | ICD-10-CM | POA: Diagnosis not present

## 2019-10-31 DIAGNOSIS — M8949 Other hypertrophic osteoarthropathy, multiple sites: Secondary | ICD-10-CM

## 2019-10-31 DIAGNOSIS — M159 Polyosteoarthritis, unspecified: Secondary | ICD-10-CM

## 2019-10-31 MED ORDER — FREESTYLE LIBRE 14 DAY SENSOR MISC: 1.0000 "application " | 11 refills | Status: DC

## 2019-10-31 NOTE — Telephone Encounter (Signed)
Patient has an appointment today. Will speak with her then.

## 2019-10-31 NOTE — Progress Notes (Signed)
Virtual Visit via Telephone Note  I connected with the patient on 10/31/19 at  9:30 AM EST by telephone and verified that I am speaking with the correct person using two identifiers.   I discussed the limitations, risks, security and privacy concerns of performing an evaluation and management service by telephone and the availability of in person appointments. I also discussed with the patient that there may be a patient responsible charge related to this service. The patient expressed understanding and agreed to proceed.  Location patient: home Location provider: work or home office Participants present for the call: patient, provider Patient did not have a visit in the prior 7 days to address this/these issue(s).   History of Present Illness: Here for several issues. First she is out of sensors for her [employer] device. She thought she may have had a UTI earlier this week because of urinary burning, but this has stopped. She is in a lot of pain with her back and her knees. She sees Dr. Maxie Better next Tuesday.   Observations/Objective: Patient sounds cheerful and well on the phone. I do not appreciate any SOB. Speech and thought processing are grossly intact. Patient reported vitals:  Assessment and Plan: For the diabetes, we will send in more sensors. The possible UTI seems to have resolved but she will let us know if it comes back. She will see Dr. Maxie Better as above.  Melanie Penna, MD   Follow Up Instructions:     [phone] 11-20 [phone] 21-30 I did not refer this patient for an OV in the next 24 hours for this/these issue(s).  I discussed the assessment and treatment plan with the patient. The patient was provided an opportunity to ask questions and all were answered. The patient agreed with the plan and demonstrated an understanding of the instructions.   The patient was advised to call back or seek an in-person evaluation if the symptoms worsen or if the condition fails to  improve as anticipated.  I provided 14 minutes of non-face-to-face time during this encounter.   Melanie Penna, MD

## 2019-11-03 ENCOUNTER — Telehealth: Payer: Self-pay | Admitting: Family Medicine

## 2019-11-03 NOTE — Telephone Encounter (Signed)
Melanie Mcdonald is calling from a health care facility

## 2019-11-04 ENCOUNTER — Telehealth: Payer: Self-pay | Admitting: Family Medicine

## 2019-11-04 DIAGNOSIS — M1712 Unilateral primary osteoarthritis, left knee: Secondary | ICD-10-CM | POA: Diagnosis not present

## 2019-11-04 DIAGNOSIS — M170 Bilateral primary osteoarthritis of knee: Secondary | ICD-10-CM | POA: Diagnosis not present

## 2019-11-04 DIAGNOSIS — M1711 Unilateral primary osteoarthritis, right knee: Secondary | ICD-10-CM | POA: Diagnosis not present

## 2019-11-04 NOTE — Telephone Encounter (Signed)
Pt is requesting a letter faxed to [employer] for discontinuance of service   fax number [phone].) The patient states she cannot use the chair;  . Patient contact number [phone]

## 2019-11-05 ENCOUNTER — Emergency Department (HOSPITAL_COMMUNITY): Payer: Medicare PPO

## 2019-11-05 ENCOUNTER — Emergency Department (HOSPITAL_COMMUNITY)
Admission: EM | Admit: 2019-11-05 | Discharge: 2019-11-05 | Disposition: A | Discharge status: Home / Self Care | Payer: Medicare PPO | Attending: Emergency Medicine | Admitting: Emergency Medicine

## 2019-11-05 ENCOUNTER — Encounter (HOSPITAL_COMMUNITY): Payer: Self-pay

## 2019-11-05 ENCOUNTER — Other Ambulatory Visit: Payer: Self-pay

## 2019-11-05 DIAGNOSIS — R52 Pain, unspecified: Secondary | ICD-10-CM | POA: Diagnosis not present

## 2019-11-05 DIAGNOSIS — E1142 Type 2 diabetes mellitus with diabetic polyneuropathy: Secondary | ICD-10-CM | POA: Insufficient documentation

## 2019-11-05 DIAGNOSIS — R519 Headache, unspecified: Secondary | ICD-10-CM | POA: Insufficient documentation

## 2019-11-05 DIAGNOSIS — Z8673 Personal history of transient ischemic attack (TIA), and cerebral infarction without residual deficits: Secondary | ICD-10-CM | POA: Insufficient documentation

## 2019-11-05 DIAGNOSIS — Y9289 Other specified places as the place of occurrence of the external cause: Secondary | ICD-10-CM | POA: Diagnosis not present

## 2019-11-05 DIAGNOSIS — S8992XA Unspecified injury of left lower leg, initial encounter: Secondary | ICD-10-CM | POA: Insufficient documentation

## 2019-11-05 DIAGNOSIS — M25562 Pain in left knee: Secondary | ICD-10-CM | POA: Insufficient documentation

## 2019-11-05 DIAGNOSIS — Z794 Long term (current) use of insulin: Secondary | ICD-10-CM | POA: Diagnosis not present

## 2019-11-05 DIAGNOSIS — R0902 Hypoxemia: Secondary | ICD-10-CM | POA: Diagnosis not present

## 2019-11-05 DIAGNOSIS — M25561 Pain in right knee: Secondary | ICD-10-CM | POA: Insufficient documentation

## 2019-11-05 DIAGNOSIS — S8990XA Unspecified injury of unspecified lower leg, initial encounter: Secondary | ICD-10-CM

## 2019-11-05 DIAGNOSIS — S0990XA Unspecified injury of head, initial encounter: Secondary | ICD-10-CM | POA: Diagnosis not present

## 2019-11-05 DIAGNOSIS — R413 Other amnesia: Secondary | ICD-10-CM | POA: Diagnosis not present

## 2019-11-05 DIAGNOSIS — W19XXXA Unspecified fall, initial encounter: Secondary | ICD-10-CM | POA: Diagnosis not present

## 2019-11-05 DIAGNOSIS — Y999 Unspecified external cause status: Secondary | ICD-10-CM | POA: Insufficient documentation

## 2019-11-05 DIAGNOSIS — R55 Syncope and collapse: Secondary | ICD-10-CM | POA: Diagnosis not present

## 2019-11-05 DIAGNOSIS — S8991XA Unspecified injury of right lower leg, initial encounter: Secondary | ICD-10-CM | POA: Diagnosis not present

## 2019-11-05 DIAGNOSIS — Y9389 Activity, other specified: Secondary | ICD-10-CM | POA: Diagnosis not present

## 2019-11-05 DIAGNOSIS — M1990 Unspecified osteoarthritis, unspecified site: Secondary | ICD-10-CM | POA: Diagnosis not present

## 2019-11-05 DIAGNOSIS — Z7902 Long term (current) use of antithrombotics/antiplatelets: Secondary | ICD-10-CM | POA: Diagnosis not present

## 2019-11-05 DIAGNOSIS — W010XXA Fall on same level from slipping, tripping and stumbling without subsequent striking against object, initial encounter: Secondary | ICD-10-CM | POA: Diagnosis not present

## 2019-11-05 MED ORDER — OXYCODONE-ACETAMINOPHEN 5-325 MG PO TABS
2.0000 | ORAL_TABLET | Freq: Once | ORAL | Status: AC
  Administered 2019-11-05: 2 via ORAL
  Filled 2019-11-05: qty 2

## 2019-11-05 NOTE — ED Notes (Signed)
Pt transported to X-ray and then CT.

## 2019-11-05 NOTE — ED Provider Notes (Addendum)
[facility] EMERGENCY DEPARTMENT Provider Note   CSN: [id number] Arrival date & time: 11/05/19  0750     History Chief Complaint  Patient presents with  . Fall    Melanie Mcdonald is a 86 y.o. female.  HPI 86 year old female presents with a fall. She was walking onto the patio and tripped. Denies syncope/near syncope. Fell flat. Hurt both knees, which she received injections for yesterday. Was unable to get up for several minutes and was laying on her knees. Bilateral severe knee pain. Did not hit her head or lose consciousness. Does have a mild headache. Is on plavix. No neck pain. No knew leg/foot pain otherwise. Has not taken anything for the pain. No weakness/numbness. Chronic leg swelling is unchanged. No dyspnea.   Past Medical History:  Diagnosis Date  . Abdominal pain, unspecified site 03/09/2014  . Acute on chronic diastolic CHF (congestive heart failure), NYHA class 3 ([place]) 11/03/2014   Acute on chronic diastolic heart failure  . Acute respiratory failure ([place]) 06/17/2018  . ANEMIA 01/30/2008  . ARTHRITIS 12/11/2007  . BACK PAIN, CHRONIC 06/23/2008  . Backache 06/23/2008   Qualifier: Diagnosis of  By: Niel Hummer MD, [place] CYST, RIGHT 12/17/2007  . Cancer ([place])   . Candidiasis of genitalia in female 07/20/2017  . Cellulitis of left leg 08/14/2011  . Cellulitis of right foot 06/13/2016  . Cervicogenic headache 06/14/2015  . Chest pain 06/08/2011   Atypical chest pain  . Chest pain 07/20/2017  . CHF (congestive heart failure) ([place])   . CHF (congestive heart failure), NYHA class II, chronic, systolic ([place]) 123456  . Colon polyps    FRAGMENTS OF HYPERPLASTIC POLYP  . CONTACT DERMATITIS 03/10/2009  . COPD (chronic obstructive pulmonary disease) ([place])   . CORONARY ARTERY DISEASE 03/01/2007   had a normal Myoview stress test 07-13-11  . Coronary atherosclerosis 03/01/2007   Qualifier: Diagnosis of  By: Cori Razor RN, Mikal Plane   . CVA (cerebral vascular  accident) ([place]) 07/20/2017  . CYSTOCELE WITHOUT MENTION UTERINE PROLAPSE LAT 09/12/2010  . DEGENERATIVE JOINT DISEASE 08/07/2007  . DEPRESSION 03/01/2007  . Depression, recurrent ([place]) 03/01/2007   Qualifier: Diagnosis of  By: Cori Razor RN, Mikal Plane   . DIABETES MELLITUS, TYPE II 08/26/2008  . Diabetic polyneuropathy associated with diabetes mellitus due to underlying condition ([place]) 06/14/2015  . Diarrhea 08/15/2011  . Diverticulosis of colon (without mention of hemorrhage)   . Dizziness 02/10/2014  . DOE (dyspnea on exertion) 11/03/2014  . Edema 09/02/2008   Centricity Description: EDEMA Qualifier: Diagnosis of  By: Niel Hummer MD, Lorinda Creed  Centricity Description: PERIPHERAL EDEMA Qualifier: Diagnosis of  By: Niel Hummer MD, Lorinda Creed   . Esophageal reflux 11/06/2007  . Essential hypertension 03/01/2007   Qualifier: Diagnosis of  By: Cori Razor RN, Mikal Plane   . Gait instability 11/13/2014  . Gout 06/07/2016  . Headache 12/30/2013  . HIP PAIN 09/15/2010   Qualifier: Diagnosis of  By: Elease Hashimoto MD, [place]    . HYPERKERATOSIS 06/02/2009  . HYPERLIPIDEMIA 08/26/2008  . Hyperlipidemia 08/26/2008   Qualifier: Diagnosis of  By: Cori Razor RN, Mikal Plane HYPERTENSION 03/01/2007  . Hypokalemia 08/15/2011  . Insomnia 12/11/2007  . Irritable bowel syndrome 01/26/2009  . LABYRINTHITIS 11/03/2009  . Labyrinthitis 11/03/2009   Qualifier: Diagnosis of  By: Niel Hummer MD, Lorinda Creed   . Leg swelling 04/05/2016  . Memory loss 12/30/2013  . Narcolepsy without cataplexy 08/26/2008  Qualifier: Diagnosis of  By: Niel Hummer MD, Lorinda Creed   . 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 NEPHROLITHIASIS 04/29/2009  . OBESITY 03/01/2007  . Obstructive apnea 11/03/2014  . Osteoarthritis 03/15/2018  . Pain in joint, ankle and foot 12/24/2015  . PERIPHERAL VASCULAR DISEASE 01/26/2009  . Peripheral vascular disease ([place]) 01/26/2009   Qualifier: Diagnosis of  By: Niel Hummer MD, [place] history of colonic polyps 03/09/2014  .  Stroke ([place])   . SYNCOPE 08/26/2008   had brain MRI on 06-28-12 showing only chronic microvascular ischemia and atrophy   . SYNDROME, RESTLESS LEGS 03/01/2007   Qualifier: Diagnosis of  By: Cori Razor RN, Mikal Plane Thyroid nodule 04/28/2014  . TIA (transient ischemic attack) 11/13/2014  . TRANSIENT ISCHEMIC ATTACK 10/20/2009   had normal brain MRA with patent vertebrals and carotids 06-28-12  . Uncontrolled type 2 diabetes mellitus with hyperglycemia, with long-term current use of insulin ([place]) 10/09/2016  . Weakness generalized 11/13/2014    Patient Active Problem List   Diagnosis Date Noted  . Lymphedema of lower extremity 08/15/2019  . Degeneration of lumbar intervertebral disc 05/09/2019  . Spinal stenosis of lumbar region 05/09/2019  . Morbid obesity ([place]) 04/11/2019  . COPD with emphysema ([place]) 04/11/2019  . Trigger little finger of left hand 11/20/2018  . Osteoarthritis 03/15/2018  . CHF (congestive heart failure), NYHA class II, chronic, systolic ([place]) 123456  . CVA (cerebral vascular accident) ([place]) 07/20/2017  . Candidiasis of genitalia in female 07/20/2017  . Uncontrolled type 2 diabetes mellitus with hyperglycemia, with long-term current use of insulin ([place]) 10/09/2016  . Gout 06/07/2016  . Leg swelling 04/05/2016  . Pain in joint, ankle and foot 12/24/2015  . Diabetic polyneuropathy associated with diabetes mellitus due to underlying condition ([place]) 06/14/2015  . Cervicogenic headache 06/14/2015  . Weakness generalized 11/13/2014  . TIA (transient ischemic attack) 11/13/2014  . Gait instability 11/13/2014  . Acute on chronic diastolic CHF (congestive heart failure), NYHA class 3 ([place]) 11/03/2014  . Obstructive apnea 11/03/2014  . DOE (dyspnea on exertion) 11/03/2014  . Thyroid nodule 04/28/2014  . Personal history of colonic polyps 03/09/2014  . Dizziness 02/10/2014  . Memory loss 12/30/2013  . Headache 12/30/2013  . Hypokalemia 08/15/2011  . Diarrhea 08/15/2011  .  Chest pain 06/08/2011  . HIP PAIN 09/15/2010  . CYSTOCELE WITHOUT MENTION UTERINE PROLAPSE LAT 09/12/2010  . Labyrinthitis 11/03/2009  . NEPHROLITHIASIS 04/29/2009  . Peripheral vascular disease ([place]) 01/26/2009  . IRRITABLE BOWEL SYNDROME 01/26/2009  . Edema 09/02/2008  . Hyperlipidemia 08/26/2008  . Narcolepsy without cataplexy 08/26/2008  . Backache 06/23/2008  . Insomnia 12/11/2007  . Esophageal reflux 11/06/2007  . Depression, recurrent ([place]) 03/01/2007  . SYNDROME, RESTLESS LEGS 03/01/2007  . Essential hypertension 03/01/2007  . Coronary atherosclerosis 03/01/2007    Past Surgical History:  Procedure Laterality Date  . ABDOMINAL HYSTERECTOMY    . BACK SURGERY     x2  . CARDIAC CATHETERIZATION  07/2009  . CATARACT EXTRACTION    . COLONOSCOPY  11-13-07   per Dr. Deatra Ina, benign polyps, repeat in 5 yrs  . ESOPHAGOGASTRODUODENOSCOPY  11-13-07   per Dr. Deatra Ina, normal   . INTRAOCULAR LENS INSERTION    . LOOP RECORDER INSERTION N/A 07/23/2017   Procedure: LOOP RECORDER INSERTION;  Surgeon: Evans Lance, MD;  Location: [facility];  Service: Cardiovascular;  Laterality: N/A;  . SPINE SURGERY     x 3  . TONSILECTOMY,  ADENOIDECTOMY, BILATERAL MYRINGOTOMY AND TUBES       OB History   No obstetric history on file.     Family History  Problem Relation Age of Onset  . Lupus Mother   . COPD Father     Social History   Tobacco Use  . Smoking status: Never Smoker  . Smokeless tobacco: Never Used  Substance Use Topics  . Alcohol use: No    Alcohol/week: 0.0 standard drinks  . Drug use: No    Types: Oxycodone    Home Medications Prior to Admission medications   Medication Sig Start Date End Date Taking? Authorizing Provider  clopidogrel (PLAVIX) 75 MG tablet TAKE 1 TABLET BY MOUTH EVERY DAY 10/06/19   Laurey Morale, MD  Continuous Blood Gluc Receiver (FREESTYLE LIBRE 14 DAY READER) DEVI 1 Device by Does not apply route daily. Use device daily as directed  10/24/19   Laurey Morale, MD  Continuous Blood Gluc Sensor (FREESTYLE LIBRE 14 DAY SENSOR) MISC 1 application by Does not apply route every 14 (fourteen) days. 10/31/19   Laurey Morale, MD  diclofenac Sodium (VOLTAREN) 1 % GEL Apply 2 g topically 4 (four) times daily. 08/15/19   [provider]  furosemide (LASIX) 40 MG tablet TAKE 2 TABLETS BY MOUTH TWICE DAILY 10/14/19   Laurey Morale, MD  gabapentin (NEURONTIN) 300 MG capsule TAKE 1 CAPSULE(300 MG) BY MOUTH THREE TIMES DAILY 10/13/19   Laurey Morale, MD  glucose blood (ONE TOUCH ULTRA TEST) test strip 1 each by Other route as needed for other. Use as instructed 06/21/18   Laurey Morale, MD  HUMALOG KWIKPEN 100 UNIT/ML KwikPen Inject 0.2 mLs (20 Units total) into the skin 3 (three) times daily before meals. 11/01/18   Laurey Morale, MD  HYDROcodone-homatropine (HYDROMET) 5-1.5 MG/5ML syrup Take 5 mLs by mouth every 4 (four) hours as needed. 12/26/18   Laurey Morale, MD  Insulin Pen Needle (NOVOFINE) 32G X 6 MM MISC CHECK BLOOD GLUCOSE THREE TIMES DAILY 09/03/19   Laurey Morale, MD  meclizine (ANTIVERT) 25 MG tablet Take 1 tablet (25 mg total) by mouth 3 (three) times daily as needed for dizziness. 10/21/19   Laurey Morale, MD  mupirocin ointment (BACTROBAN) 2 % mupirocin 2 % topical ointment  APP TO SORE ON RIGHT ARM BID FOR 2 WKS    [provider]  nitroGLYCERIN (NITROSTAT) 0.4 MG SL tablet Place 0.4 mg under the tongue every 5 (five) minutes as needed for chest pain.    [provider]  phenazopyridine (PYRIDIUM) 200 MG tablet Take 1 tablet (200 mg total) by mouth 3 (three) times daily as needed (urinary pain). 04/11/19   Laurey Morale, MD  pramipexole (MIRAPEX) 1 MG tablet TAKE 4 TABLETS BY MOUTH ONCE DAILY 05/27/19   Laurey Morale, MD  promethazine (PHENERGAN) 25 MG tablet Take 1 tablet (25 mg total) by mouth every 4 (four) hours as needed for nausea or vomiting. 08/07/19   Laurey Morale, MD  spironolactone  (ALDACTONE) 25 MG tablet TAKE 1 TABLET(25 MG) BY MOUTH TWICE DAILY 04/23/19   Laurey Morale, MD  terbinafine (LAMISIL) 250 MG tablet Take 1 tablet (250 mg total) by mouth daily. 06/07/18   Laurey Morale, MD  TRESIBA FLEXTOUCH 100 UNIT/ML SOPN FlexTouch Pen INJECT 80 UNITS INTO THE SKIN EVERY MORNING 10/14/19   Laurey Morale, MD  zolpidem (AMBIEN) 10 MG tablet TAKE 1 TABLET(10 MG) BY MOUTH  AT BEDTIME AS NEEDED FOR SLEEP 05/21/19   Laurey Morale, MD    Allergies    Lyrica [pregabalin], Aspirin, Ciprofloxacin, Clarithromycin, Doxycycline, Erythromycin, Lisinopril, Macrobid [nitrofurantoin monohyd macro], Metolazone, Other, Penicillins, Sulfonamide derivatives, and Valtrex [valacyclovir hcl]  Review of Systems   Review of Systems  Musculoskeletal: Positive for arthralgias and joint swelling. Negative for neck pain.  Neurological: Positive for headaches. Negative for syncope, weakness, light-headedness and numbness.  All other systems reviewed and are negative.   Physical Exam Updated Vital Signs BP 133/64   Pulse 92   Temp 98.6 F (37 C)   Resp 15   SpO2 96%   Physical Exam Vitals and nursing note reviewed.  Constitutional:      Appearance: She is well-developed. She is obese.  HENT:     Head: Normocephalic and atraumatic.     Right Ear: External ear normal.     Left Ear: External ear normal.     Nose: Nose normal.  Eyes:     General:        Right eye: No discharge.        Left eye: No discharge.     Pupils: Pupils are equal, round, and reactive to light.  Cardiovascular:     Rate and Rhythm: Normal rate and regular rhythm.     Pulses:          Dorsalis pedis pulses are 2+ on the right side and 2+ on the left side.     Heart sounds: Murmur present.  Pulmonary:     Effort: Pulmonary effort is normal.     Breath sounds: Normal breath sounds.  Abdominal:     Palpations: Abdomen is soft.     Tenderness: There is no abdominal tenderness.  Musculoskeletal:     Cervical back:  Normal range of motion and neck supple. No tenderness.     Right hip: Normal range of motion.     Left hip: Normal range of motion.     Right knee: Swelling present. Decreased range of motion. Tenderness present.     Left knee: Swelling present. Decreased range of motion. Tenderness present.     Right lower leg: Swelling (mild) present. No tenderness.     Left lower leg: Swelling (mild) present. No tenderness.     Right ankle:     Right Achilles Tendon: No tenderness.     Left ankle:     Left Achilles Tendon: No tenderness.     Right foot: No tenderness.     Left foot: No tenderness.  Skin:    General: Skin is warm and dry.  Neurological:     Mental Status: She is alert.  Psychiatric:        Mood and Affect: Mood is not anxious.     ED Results / Procedures / Treatments   Labs (all labs ordered are listed, but only abnormal results are displayed) Labs Reviewed - No data to display  EKG None  Radiology CT Head Wo Contrast  Result Date: 11/05/2019 CLINICAL DATA:  Syncope 2.5 weeks ago, fell this morning EXAM: CT HEAD WITHOUT CONTRAST TECHNIQUE: Contiguous axial images were obtained from the base of the skull through the vertex without intravenous contrast. COMPARISON:  11/21/2018 FINDINGS: Brain: No acute infarct or hemorrhage. Stable chronic small vessel ischemic changes within the periventricular white matter. Chronic small left cerebellar infarct. Lateral ventricles and midline structures are unremarkable. No acute extra-axial fluid collections. No mass effect. Vascular: No hyperdense vessel or unexpected calcification.  Skull: Normal. Negative for fracture or focal lesion. Sinuses/Orbits: Mild polypoid mucosal thickening within the left frontal ethmoidal recess. Remaining sinuses are clear. Other: None IMPRESSION: 1. Stable chronic ischemic changes.  No acute intracranial process. Electronically Signed   By: Randa Ngo M.D.   On: 11/05/2019 09:00   DG Knee Complete 4 Views  Left  Result Date: 11/05/2019 CLINICAL DATA:  [place] this morning, knee pain EXAM: LEFT KNEE - COMPLETE 4+ VIEW COMPARISON:  None. FINDINGS: Frontal, bilateral oblique, lateral views of the left knee demonstrate 3 compartment osteoarthritis, most significant in the medial compartment with joint space narrowing and osteophyte formation. No fracture, subluxation, or dislocation. No joint effusion. IMPRESSION: 1. Prominent medial compartmental osteoarthritis. Less significant changes in the patellofemoral and lateral compartments. 2. No acute fracture. Electronically Signed   By: Randa Ngo M.D.   On: 11/05/2019 08:48   DG Knee Complete 4 Views Right  Result Date: 11/05/2019 CLINICAL DATA:  [place], bilateral knee pain EXAM: RIGHT KNEE - COMPLETE 4+ VIEW COMPARISON:  None. FINDINGS: Frontal, bilateral oblique, lateral views of the right knee are obtained. Moderate 3 compartmental osteoarthritis greatest in the medial compartment. Prominent joint space narrowing and osteophyte formation. No acute fracture, subluxation, or dislocation. No joint effusion. IMPRESSION: 1. Moderate 3 compartment osteoarthritis, greatest medially. 2. No acute fracture. Electronically Signed   By: Randa Ngo M.D.   On: 11/05/2019 08:47    Procedures Procedures (including critical care time)  Medications Ordered in ED Medications  oxyCODONE-acetaminophen (PERCOCET/ROXICET) 5-325 MG per tablet 2 tablet (2 tablets Oral Given 11/05/19 0818)    ED Course  I have reviewed the triage vital signs and the nursing notes.  Pertinent labs & imaging results that were available during my care of the patient were reviewed by me and considered in my medical decision making (see chart for details).    MDM Rules/Calculators/A&P                      Patient's knee x-rays are benign.  Given she is on Plavix with headache after fall, CT obtained but is negative.  She is able to ambulate with a walker which is her baseline.  Given this I  have very low suspicion for occult fracture. Sounds like mechanical fall.  Appears stable for discharge home. Final Clinical Impression(s) / ED Diagnoses Final diagnoses:  Fall, initial encounter  Knee injury, unspecified laterality, initial encounter    Rx / DC Orders ED Discharge Orders    None       Sherwood Gambler, MD 11/05/19 [id number]    Sherwood Gambler, MD 11/05/19 [phone]

## 2019-11-05 NOTE — Telephone Encounter (Signed)
Left message for Gerald Stabs to call back.  Please see what healthcare facility they are calling from and what it is that they are needing.

## 2019-11-05 NOTE — ED Notes (Signed)
Pt ambulated around the room with steady gait. Pt did not complain of dizziness.

## 2019-11-05 NOTE — Telephone Encounter (Signed)
The letter is ready to fax  

## 2019-11-05 NOTE — ED Notes (Signed)
Dr. Goldston at bedside.  

## 2019-11-05 NOTE — ED Triage Notes (Addendum)
Per [facility]: From home: This AM walked out to patio, lost balance and fell. PT was on her knees for about 10-15 minutes, received cortisone injections into both knees yesterday. Pt scooted herself into kitchen and called EMS. PT has bilateral leg swelling. No LOC. Pt complaining of foot pain. Pt alert and oriented.

## 2019-11-05 NOTE — Telephone Encounter (Signed)
Please advise 

## 2019-11-05 NOTE — ED Notes (Signed)
This RN called pts son, gave update, he will come pick the pt up from the ED.

## 2019-11-05 NOTE — ED Notes (Signed)
With pts permission, this RN called pts son and updated him about plan of care.

## 2019-11-06 ENCOUNTER — Telehealth: Payer: Self-pay | Admitting: Family Medicine

## 2019-11-06 MED ORDER — OXYCODONE-ACETAMINOPHEN 10-325 MG PO TABS: 1.0000 | ORAL_TABLET | ORAL | 0 refills | Status: AC | PRN

## 2019-11-06 NOTE — Telephone Encounter (Signed)
Patient is aware 

## 2019-11-06 NOTE — Telephone Encounter (Signed)
I sent in some Percocet for her (I did review the ER note)

## 2019-11-06 NOTE — Telephone Encounter (Signed)
Pt fell yesterday and went to the hospital. Pt is in a lot of pain and is requesting pain medication. Per pt, the hospital did not prescribe pain medication. Pt uses [facility] representative on [place] [address]. Marina Gravel

## 2019-11-07 ENCOUNTER — Encounter (HOSPITAL_COMMUNITY): Payer: Self-pay | Admitting: Emergency Medicine

## 2019-11-07 ENCOUNTER — Emergency Department (HOSPITAL_COMMUNITY): Payer: Medicare PPO

## 2019-11-07 ENCOUNTER — Other Ambulatory Visit: Payer: Self-pay

## 2019-11-07 ENCOUNTER — Telehealth: Payer: Self-pay | Admitting: Family Medicine

## 2019-11-07 ENCOUNTER — Observation Stay (HOSPITAL_COMMUNITY): Payer: Medicare PPO

## 2019-11-07 ENCOUNTER — Inpatient Hospital Stay (HOSPITAL_COMMUNITY)
Admission: EM | Admit: 2019-11-07 | Discharge: 2019-11-11 | DRG: 191 | Disposition: A | Discharge status: Home Health | Payer: Medicare PPO | Attending: Internal Medicine | Admitting: Internal Medicine

## 2019-11-07 DIAGNOSIS — R062 Wheezing: Secondary | ICD-10-CM | POA: Diagnosis not present

## 2019-11-07 DIAGNOSIS — M1990 Unspecified osteoarthritis, unspecified site: Secondary | ICD-10-CM | POA: Diagnosis present

## 2019-11-07 DIAGNOSIS — G4733 Obstructive sleep apnea (adult) (pediatric): Secondary | ICD-10-CM | POA: Diagnosis present

## 2019-11-07 DIAGNOSIS — Z794 Long term (current) use of insulin: Secondary | ICD-10-CM

## 2019-11-07 DIAGNOSIS — M7989 Other specified soft tissue disorders: Secondary | ICD-10-CM | POA: Diagnosis present

## 2019-11-07 DIAGNOSIS — S79911A Unspecified injury of right hip, initial encounter: Secondary | ICD-10-CM | POA: Diagnosis not present

## 2019-11-07 DIAGNOSIS — Z8673 Personal history of transient ischemic attack (TIA), and cerebral infarction without residual deficits: Secondary | ICD-10-CM | POA: Diagnosis not present

## 2019-11-07 DIAGNOSIS — R0902 Hypoxemia: Secondary | ICD-10-CM | POA: Diagnosis present

## 2019-11-07 DIAGNOSIS — M1712 Unilateral primary osteoarthritis, left knee: Secondary | ICD-10-CM | POA: Diagnosis not present

## 2019-11-07 DIAGNOSIS — M1711 Unilateral primary osteoarthritis, right knee: Secondary | ICD-10-CM | POA: Diagnosis not present

## 2019-11-07 DIAGNOSIS — Z7902 Long term (current) use of antithrombotics/antiplatelets: Secondary | ICD-10-CM | POA: Diagnosis not present

## 2019-11-07 DIAGNOSIS — G8929 Other chronic pain: Secondary | ICD-10-CM | POA: Diagnosis present

## 2019-11-07 DIAGNOSIS — J441 Chronic obstructive pulmonary disease with (acute) exacerbation: Secondary | ICD-10-CM | POA: Diagnosis present

## 2019-11-07 DIAGNOSIS — S79912A Unspecified injury of left hip, initial encounter: Secondary | ICD-10-CM | POA: Diagnosis not present

## 2019-11-07 DIAGNOSIS — R0602 Shortness of breath: Secondary | ICD-10-CM | POA: Diagnosis not present

## 2019-11-07 DIAGNOSIS — I5022 Chronic systolic (congestive) heart failure: Secondary | ICD-10-CM | POA: Diagnosis present

## 2019-11-07 DIAGNOSIS — G2581 Restless legs syndrome: Secondary | ICD-10-CM | POA: Diagnosis present

## 2019-11-07 DIAGNOSIS — Z6836 Body mass index (BMI) 36.0-36.9, adult: Secondary | ICD-10-CM | POA: Diagnosis not present

## 2019-11-07 DIAGNOSIS — G4730 Sleep apnea, unspecified: Secondary | ICD-10-CM

## 2019-11-07 DIAGNOSIS — M8949 Other hypertrophic osteoarthropathy, multiple sites: Secondary | ICD-10-CM

## 2019-11-07 DIAGNOSIS — E860 Dehydration: Secondary | ICD-10-CM | POA: Diagnosis present

## 2019-11-07 DIAGNOSIS — I2510 Atherosclerotic heart disease of native coronary artery without angina pectoris: Secondary | ICD-10-CM | POA: Diagnosis present

## 2019-11-07 DIAGNOSIS — I10 Essential (primary) hypertension: Secondary | ICD-10-CM | POA: Diagnosis not present

## 2019-11-07 DIAGNOSIS — Z825 Family history of asthma and other chronic lower respiratory diseases: Secondary | ICD-10-CM | POA: Diagnosis not present

## 2019-11-07 DIAGNOSIS — I440 Atrioventricular block, first degree: Secondary | ICD-10-CM | POA: Diagnosis not present

## 2019-11-07 DIAGNOSIS — Z832 Family history of diseases of the blood and blood-forming organs and certain disorders involving the immune mechanism: Secondary | ICD-10-CM | POA: Diagnosis not present

## 2019-11-07 DIAGNOSIS — M25551 Pain in right hip: Secondary | ICD-10-CM | POA: Diagnosis not present

## 2019-11-07 DIAGNOSIS — Z20822 Contact with and (suspected) exposure to covid-19: Secondary | ICD-10-CM | POA: Diagnosis present

## 2019-11-07 DIAGNOSIS — R55 Syncope and collapse: Secondary | ICD-10-CM

## 2019-11-07 DIAGNOSIS — I110 Hypertensive heart disease with heart failure: Secondary | ICD-10-CM | POA: Diagnosis present

## 2019-11-07 DIAGNOSIS — E1165 Type 2 diabetes mellitus with hyperglycemia: Secondary | ICD-10-CM | POA: Diagnosis present

## 2019-11-07 DIAGNOSIS — M48061 Spinal stenosis, lumbar region without neurogenic claudication: Secondary | ICD-10-CM | POA: Diagnosis present

## 2019-11-07 DIAGNOSIS — M25552 Pain in left hip: Secondary | ICD-10-CM | POA: Diagnosis not present

## 2019-11-07 DIAGNOSIS — R296 Repeated falls: Secondary | ICD-10-CM

## 2019-11-07 DIAGNOSIS — I700 Atherosclerosis of aorta: Secondary | ICD-10-CM | POA: Diagnosis not present

## 2019-11-07 DIAGNOSIS — I5033 Acute on chronic diastolic (congestive) heart failure: Secondary | ICD-10-CM

## 2019-11-07 LAB — CBC WITH DIFFERENTIAL/PLATELET
Abs Immature Granulocytes: 0.04 10*3/uL (ref 0.00–0.07)
Basophils Absolute: 0.1 10*3/uL (ref 0.0–0.1)
Basophils Relative: 1 %
Eosinophils Absolute: 0.2 10*3/uL (ref 0.0–0.5)
Eosinophils Relative: 2 %
HCT: 41.3 % (ref 36.0–46.0)
Hemoglobin: 13 g/dL (ref 12.0–15.0)
Immature Granulocytes: 0 %
Lymphocytes Relative: 20 %
Lymphs Abs: 2.1 10*3/uL (ref 0.7–4.0)
MCH: 29.6 pg (ref 26.0–34.0)
MCHC: 31.5 g/dL (ref 30.0–36.0)
MCV: 94.1 fL (ref 80.0–100.0)
Monocytes Absolute: 0.5 10*3/uL (ref 0.1–1.0)
Monocytes Relative: 5 %
Neutro Abs: 7.6 10*3/uL (ref 1.7–7.7)
Neutrophils Relative %: 72 %
Platelets: 255 10*3/uL (ref 150–400)
RBC: 4.39 MIL/uL (ref 3.87–5.11)
RDW: 13.7 % (ref 11.5–15.5)
WBC: 10.5 10*3/uL (ref 4.0–10.5)
nRBC: 0 % (ref 0.0–0.2)

## 2019-11-07 LAB — COMPREHENSIVE METABOLIC PANEL
ALT: 18 U/L (ref 0–44)
AST: 20 U/L (ref 15–41)
Albumin: 3.4 g/dL — ABNORMAL LOW (ref 3.5–5.0)
Alkaline Phosphatase: 74 U/L (ref 38–126)
Anion gap: 7 (ref 5–15)
BUN: 17 mg/dL (ref 8–23)
CO2: 28 mmol/L (ref 22–32)
Calcium: 8.9 mg/dL (ref 8.9–10.3)
Chloride: 107 mmol/L (ref 98–111)
Creatinine, Ser: 0.88 mg/dL (ref 0.44–1.00)
GFR calc Af Amer: >60 mL/min (ref >60)
GFR calc non Af Amer: >60 mL/min (ref >60)
Glucose, Bld: 166 mg/dL — ABNORMAL HIGH (ref 70–99)
Potassium: 4.5 mmol/L (ref 3.5–5.1)
Sodium: 142 mmol/L (ref 135–145)
Total Bilirubin: 0.4 mg/dL (ref 0.3–1.2)
Total Protein: 6.9 g/dL (ref 6.5–8.1)

## 2019-11-07 LAB — POCT I-STAT EG7
Acid-Base Excess: 2 mmol/L (ref 0.0–2.0)
Bicarbonate: 29.4 mmol/L — ABNORMAL HIGH (ref 20.0–28.0)
Calcium, Ion: 1.21 mmol/L (ref 1.15–1.40)
HCT: 39 % (ref 36.0–46.0)
Hemoglobin: 13.3 g/dL (ref 12.0–15.0)
O2 Saturation: 92 %
Potassium: 4.4 mmol/L (ref 3.5–5.1)
Sodium: 142 mmol/L (ref 135–145)
TCO2: 31 mmol/L (ref 22–32)
pCO2, Ven: 55.1 mmHg (ref 44.0–60.0)
pH, Ven: 7.335 (ref 7.250–7.430)
pO2, Ven: 69 mmHg — ABNORMAL HIGH (ref 32.0–45.0)

## 2019-11-07 LAB — RESPIRATORY PANEL BY RT PCR (FLU A&B, COVID)
Influenza A by PCR: NEGATIVE
Influenza B by PCR: NEGATIVE
SARS Coronavirus 2 by RT PCR: NEGATIVE

## 2019-11-07 LAB — GLUCOSE, CAPILLARY: Glucose-Capillary: 185 mg/dL — ABNORMAL HIGH (ref 70–99)

## 2019-11-07 LAB — BRAIN NATRIURETIC PEPTIDE: B Natriuretic Peptide: 301.2 pg/mL — ABNORMAL HIGH (ref 0.0–100.0)

## 2019-11-07 LAB — TROPONIN I (HIGH SENSITIVITY)
Troponin I (High Sensitivity): 28 ng/L — ABNORMAL HIGH (ref <18)
Troponin I (High Sensitivity): 46 ng/L — ABNORMAL HIGH (ref <18)

## 2019-11-07 LAB — D-DIMER, QUANTITATIVE: D-Dimer, Quant: 1.82 ug/mL-FEU — ABNORMAL HIGH (ref 0.00–0.50)

## 2019-11-07 LAB — LACTIC ACID, PLASMA: Lactic Acid, Venous: 1 mmol/L (ref 0.5–1.9)

## 2019-11-07 MED ORDER — IOHEXOL 350 MG/ML SOLN
100.0000 mL | Freq: Once | INTRAVENOUS | Status: AC | PRN
  Administered 2019-11-07: 100 mL via INTRAVENOUS

## 2019-11-07 MED ORDER — PRAMIPEXOLE DIHYDROCHLORIDE 1.5 MG PO TABS
4.0000 mg | ORAL_TABLET | Freq: Every day | ORAL | Status: DC
  Administered 2019-11-07 – 2019-11-10 (×4): 4 mg via ORAL
  Filled 2019-11-07 (×5): qty 1

## 2019-11-07 MED ORDER — CLOPIDOGREL BISULFATE 75 MG PO TABS
75.0000 mg | ORAL_TABLET | Freq: Every day | ORAL | Status: DC
  Administered 2019-11-08 – 2019-11-11 (×4): 75 mg via ORAL
  Filled 2019-11-07 (×4): qty 1

## 2019-11-07 MED ORDER — PRAMIPEXOLE DIHYDROCHLORIDE 1 MG PO TABS: 1.0000 mg | ORAL_TABLET | Freq: Three times a day (TID) | ORAL | Status: DC

## 2019-11-07 MED ORDER — INSULIN ASPART 100 UNIT/ML ~~LOC~~ SOLN
0.0000 [IU] | Freq: Three times a day (TID) | SUBCUTANEOUS | Status: DC
  Administered 2019-11-08: 12:00:00 11 [IU] via SUBCUTANEOUS
  Administered 2019-11-08: 8 [IU] via SUBCUTANEOUS
  Administered 2019-11-08 – 2019-11-09 (×2): 5 [IU] via SUBCUTANEOUS
  Administered 2019-11-09: 13:00:00 3 [IU] via SUBCUTANEOUS
  Administered 2019-11-09: 10:00:00 5 [IU] via SUBCUTANEOUS
  Administered 2019-11-10: 3 [IU] via SUBCUTANEOUS
  Administered 2019-11-10: 17:00:00 5 [IU] via SUBCUTANEOUS
  Administered 2019-11-10: 08:00:00 2 [IU] via SUBCUTANEOUS

## 2019-11-07 MED ORDER — INSULIN DETEMIR 100 UNIT/ML ~~LOC~~ SOLN
50.0000 [IU] | Freq: Every day | SUBCUTANEOUS | Status: DC
  Administered 2019-11-08 – 2019-11-11 (×4): 50 [IU] via SUBCUTANEOUS
  Filled 2019-11-07 (×4): qty 0.5

## 2019-11-07 MED ORDER — ENOXAPARIN SODIUM 40 MG/0.4ML ~~LOC~~ SOLN
40.0000 mg | SUBCUTANEOUS | Status: DC
  Administered 2019-11-07 – 2019-11-10 (×4): 40 mg via SUBCUTANEOUS
  Filled 2019-11-07 (×4): qty 0.4

## 2019-11-07 MED ORDER — TERBINAFINE HCL 250 MG PO TABS
250.0000 mg | ORAL_TABLET | Freq: Every day | ORAL | Status: DC
  Administered 2019-11-08 – 2019-11-11 (×4): 250 mg via ORAL
  Filled 2019-11-07 (×4): qty 1

## 2019-11-07 MED ORDER — POLYETHYLENE GLYCOL 3350 17 G PO PACK
17.0000 g | PACK | Freq: Every day | ORAL | Status: DC | PRN
  Filled 2019-11-07: qty 1

## 2019-11-07 MED ORDER — PREDNISONE 20 MG PO TABS
50.0000 mg | ORAL_TABLET | Freq: Every day | ORAL | Status: DC
  Administered 2019-11-08 – 2019-11-11 (×4): 50 mg via ORAL
  Filled 2019-11-07 (×4): qty 3

## 2019-11-07 MED ORDER — METHYLPREDNISOLONE SODIUM SUCC 125 MG IJ SOLR
125.0000 mg | Freq: Once | INTRAMUSCULAR | Status: AC
  Administered 2019-11-07: 125 mg via INTRAVENOUS
  Filled 2019-11-07: qty 2

## 2019-11-07 MED ORDER — FUROSEMIDE 20 MG PO TABS
60.0000 mg | ORAL_TABLET | Freq: Two times a day (BID) | ORAL | Status: DC
  Administered 2019-11-08 – 2019-11-11 (×7): 60 mg via ORAL
  Filled 2019-11-07 (×7): qty 3

## 2019-11-07 MED ORDER — NITROGLYCERIN 0.4 MG SL SUBL: 0.4000 mg | SUBLINGUAL_TABLET | SUBLINGUAL | Status: DC | PRN

## 2019-11-07 MED ORDER — PHENAZOPYRIDINE HCL 100 MG PO TABS
200.0000 mg | ORAL_TABLET | Freq: Three times a day (TID) | ORAL | Status: DC | PRN
  Filled 2019-11-07: qty 2

## 2019-11-07 MED ORDER — ZOLPIDEM TARTRATE 5 MG PO TABS
5.0000 mg | ORAL_TABLET | Freq: Every evening | ORAL | Status: DC | PRN
  Administered 2019-11-07 – 2019-11-10 (×4): 5 mg via ORAL
  Filled 2019-11-07 (×4): qty 1

## 2019-11-07 MED ORDER — INSULIN LISPRO (1 UNIT DIAL) 100 UNIT/ML (KWIKPEN): 10.0000 [IU] | PEN_INJECTOR | Freq: Three times a day (TID) | SUBCUTANEOUS | Status: DC

## 2019-11-07 MED ORDER — IPRATROPIUM-ALBUTEROL 0.5-2.5 (3) MG/3ML IN SOLN: 3.0000 mL | RESPIRATORY_TRACT | Status: DC | PRN

## 2019-11-07 MED ORDER — SPIRONOLACTONE 25 MG PO TABS
25.0000 mg | ORAL_TABLET | Freq: Two times a day (BID) | ORAL | Status: DC
  Administered 2019-11-07 – 2019-11-11 (×8): 25 mg via ORAL
  Filled 2019-11-07 (×8): qty 1

## 2019-11-07 MED ORDER — OXYCODONE-ACETAMINOPHEN 5-325 MG PO TABS
1.0000 | ORAL_TABLET | ORAL | Status: DC | PRN
  Administered 2019-11-07 – 2019-11-11 (×8): 1 via ORAL
  Filled 2019-11-07 (×8): qty 1

## 2019-11-07 MED ORDER — ONDANSETRON HCL 4 MG PO TABS: 4.0000 mg | ORAL_TABLET | Freq: Four times a day (QID) | ORAL | Status: DC | PRN

## 2019-11-07 MED ORDER — ONDANSETRON HCL 4 MG/2ML IJ SOLN: 4.0000 mg | Freq: Four times a day (QID) | INTRAMUSCULAR | Status: DC | PRN

## 2019-11-07 MED ORDER — ACETAMINOPHEN 325 MG PO TABS
650.0000 mg | ORAL_TABLET | Freq: Four times a day (QID) | ORAL | Status: DC | PRN
  Administered 2019-11-09: 22:00:00 650 mg via ORAL
  Filled 2019-11-07: qty 2

## 2019-11-07 MED ORDER — ACETAMINOPHEN 650 MG RE SUPP: 650.0000 mg | Freq: Four times a day (QID) | RECTAL | Status: DC | PRN

## 2019-11-07 MED ORDER — SODIUM CHLORIDE 0.9 % IV SOLN: INTRAVENOUS | Status: DC

## 2019-11-07 MED ORDER — INSULIN ASPART 100 UNIT/ML ~~LOC~~ SOLN
10.0000 [IU] | Freq: Three times a day (TID) | SUBCUTANEOUS | Status: DC
  Administered 2019-11-08 – 2019-11-11 (×10): 10 [IU] via SUBCUTANEOUS

## 2019-11-07 MED ORDER — INSULIN DEGLUDEC 100 UNIT/ML ~~LOC~~ SOPN: 50.0000 [IU] | PEN_INJECTOR | Freq: Every day | SUBCUTANEOUS | Status: DC

## 2019-11-07 MED ORDER — FUROSEMIDE 20 MG PO TABS: 40.0000 mg | ORAL_TABLET | Freq: Two times a day (BID) | ORAL | Status: DC

## 2019-11-07 NOTE — Telephone Encounter (Signed)
Melanie Mcdonald from Nurse line called and said that the pt friend said she is short of breath, oxygen levels are low 86%, chest feels heavy,  edema in legs and feet, and too weak to get out of the bed. They would like to know if they need to call an ambulance or get her to the ER.  Melanie Mcdonald said she will fax her note over  Pt can be reached at [phone] Or her friend Melanie Mcdonald [phone]

## 2019-11-07 NOTE — ED Provider Notes (Signed)
[facility] EMERGENCY DEPARTMENT Provider Note   CSN: [id number] Arrival date & time: 11/07/19  1240     History Chief Complaint  Patient presents with  . Shortness of Breath    Melanie Mcdonald is a 86 y.o. female.  Patient is an 86 year old female with a significant past medical history of diastolic CHF, COPD with prior acute respiratory failure, CAD, chronic back pain with sciatica and multiple joint pains on chronic opiates and recently started gabapentin who presents today with another fall.  Patient was seen on 11/05/2019 after tripping and falling on both knees.  At that time she had no evidence of acute injury and was discharged home.  She states this morning after her son left for work after 5 AM she was locking the front door and reports she was shuffling while she walked and did not pick her foot up and tripped over the threshold of the door falling flat on her chest and abdomen.  She had to crawl into the kitchen and then pull herself up and called 911.  Patient reports when she was at home she started having significant shortness of breath with some wheezing.  EMS reported that patient's oxygen saturation was 80% on room air and she was placed on a nonrebreather.  Here she states that shortness of breath has improved but she still feels winded.  Over the last several days she reports just not feeling well.  She denies new cough, fever, nausea or vomiting.  She does report she has had intermittent issues with dizziness and difficulty with ambulating to where her son is having to help her even to the bathroom.  She feels it is worse after taking gabapentin which she normally does not take on a regular basis but did take yesterday.  She has had no other recent medication changes.  She denies any chest pain and no new shortness of breath.  No dysuria, frequency or urgency.  No diarrhea.  The history is provided by the patient and medical records.  Shortness of Breath       Past Medical History:  Diagnosis Date  . Abdominal pain, unspecified site 03/09/2014  . Acute on chronic diastolic CHF (congestive heart failure), NYHA class 3 ([place]) 11/03/2014   Acute on chronic diastolic heart failure  . Acute respiratory failure ([place]) 06/17/2018  . ANEMIA 01/30/2008  . ARTHRITIS 12/11/2007  . BACK PAIN, CHRONIC 06/23/2008  . Backache 06/23/2008   Qualifier: Diagnosis of  By: Niel Hummer MD, [place] CYST, RIGHT 12/17/2007  . Cancer ([place])   . Candidiasis of genitalia in female 07/20/2017  . Cellulitis of left leg 08/14/2011  . Cellulitis of right foot 06/13/2016  . Cervicogenic headache 06/14/2015  . Chest pain 06/08/2011   Atypical chest pain  . Chest pain 07/20/2017  . CHF (congestive heart failure) ([place])   . CHF (congestive heart failure), NYHA class II, chronic, systolic ([place]) 123456  . Colon polyps    FRAGMENTS OF HYPERPLASTIC POLYP  . CONTACT DERMATITIS 03/10/2009  . COPD (chronic obstructive pulmonary disease) ([place])   . CORONARY ARTERY DISEASE 03/01/2007   had a normal Myoview stress test 07-13-11  . Coronary atherosclerosis 03/01/2007   Qualifier: Diagnosis of  By: Cori Razor RN, Mikal Plane   . CVA (cerebral vascular accident) ([place]) 07/20/2017  . CYSTOCELE WITHOUT MENTION UTERINE PROLAPSE LAT 09/12/2010  . DEGENERATIVE JOINT DISEASE 08/07/2007  . DEPRESSION 03/01/2007  . Depression, recurrent ([place]) 03/01/2007   Qualifier:  Diagnosis of  By: Cori Razor RN, Mikal Plane DIABETES MELLITUS, TYPE II 08/26/2008  . Diabetic polyneuropathy associated with diabetes mellitus due to underlying condition ([place]) 06/14/2015  . Diarrhea 08/15/2011  . Diverticulosis of colon (without mention of hemorrhage)   . Dizziness 02/10/2014  . DOE (dyspnea on exertion) 11/03/2014  . Edema 09/02/2008   Centricity Description: EDEMA Qualifier: Diagnosis of  By: Niel Hummer MD, Lorinda Creed  Centricity Description: PERIPHERAL EDEMA Qualifier: Diagnosis of  By: Niel Hummer MD, Lorinda Creed   . Esophageal  reflux 11/06/2007  . Essential hypertension 03/01/2007   Qualifier: Diagnosis of  By: Cori Razor RN, Mikal Plane   . Gait instability 11/13/2014  . Gout 06/07/2016  . Headache 12/30/2013  . HIP PAIN 09/15/2010   Qualifier: Diagnosis of  By: Elease Hashimoto MD, [place]    . HYPERKERATOSIS 06/02/2009  . HYPERLIPIDEMIA 08/26/2008  . Hyperlipidemia 08/26/2008   Qualifier: Diagnosis of  By: Cori Razor RN, Mikal Plane HYPERTENSION 03/01/2007  . Hypokalemia 08/15/2011  . Insomnia 12/11/2007  . Irritable bowel syndrome 01/26/2009  . LABYRINTHITIS 11/03/2009  . Labyrinthitis 11/03/2009   Qualifier: Diagnosis of  By: Niel Hummer MD, Lorinda Creed   . Leg swelling 04/05/2016  . Memory loss 12/30/2013  . Narcolepsy without cataplexy 08/26/2008   Qualifier: Diagnosis of  By: Niel Hummer MD, [place] Narcolepsy without cataplexy(347.00) 08/26/2008  . NEPHROLITHIASIS 04/29/2009  . OBESITY 03/01/2007  . Obstructive apnea 11/03/2014  . Osteoarthritis 03/15/2018  . Pain in joint, ankle and foot 12/24/2015  . PERIPHERAL VASCULAR DISEASE 01/26/2009  . Peripheral vascular disease ([place]) 01/26/2009   Qualifier: Diagnosis of  By: Niel Hummer MD, [place] history of colonic polyps 03/09/2014  . Stroke ([place])   . SYNCOPE 08/26/2008   had brain MRI on 06-28-12 showing only chronic microvascular ischemia and atrophy   . SYNDROME, RESTLESS LEGS 03/01/2007   Qualifier: Diagnosis of  By: Cori Razor RN, Mikal Plane Thyroid nodule 04/28/2014  . TIA (transient ischemic attack) 11/13/2014  . TRANSIENT ISCHEMIC ATTACK 10/20/2009   had normal brain MRA with patent vertebrals and carotids 06-28-12  . Uncontrolled type 2 diabetes mellitus with hyperglycemia, with long-term current use of insulin ([place]) 10/09/2016  . Weakness generalized 11/13/2014    Patient Active Problem List   Diagnosis Date Noted  . Lymphedema of lower extremity 08/15/2019  . Degeneration of lumbar intervertebral disc 05/09/2019  . Spinal stenosis of lumbar region 05/09/2019  . Morbid  obesity ([place]) 04/11/2019  . COPD with emphysema ([place]) 04/11/2019  . Trigger little finger of left hand 11/20/2018  . Osteoarthritis 03/15/2018  . CHF (congestive heart failure), NYHA class II, chronic, systolic ([place]) 123456  . CVA (cerebral vascular accident) ([place]) 07/20/2017  . Candidiasis of genitalia in female 07/20/2017  . Uncontrolled type 2 diabetes mellitus with hyperglycemia, with long-term current use of insulin ([place]) 10/09/2016  . Gout 06/07/2016  . Leg swelling 04/05/2016  . Pain in joint, ankle and foot 12/24/2015  . Diabetic polyneuropathy associated with diabetes mellitus due to underlying condition ([place]) 06/14/2015  . Cervicogenic headache 06/14/2015  . Weakness generalized 11/13/2014  . TIA (transient ischemic attack) 11/13/2014  . Gait instability 11/13/2014  . Acute on chronic diastolic CHF (congestive heart failure), NYHA class 3 ([place]) 11/03/2014  . Obstructive apnea 11/03/2014  . DOE (dyspnea on exertion) 11/03/2014  . Thyroid nodule 04/28/2014  . Personal history of colonic polyps 03/09/2014  . Dizziness  02/10/2014  . Memory loss 12/30/2013  . Headache 12/30/2013  . Hypokalemia 08/15/2011  . Diarrhea 08/15/2011  . Chest pain 06/08/2011  . HIP PAIN 09/15/2010  . CYSTOCELE WITHOUT MENTION UTERINE PROLAPSE LAT 09/12/2010  . Labyrinthitis 11/03/2009  . NEPHROLITHIASIS 04/29/2009  . Peripheral vascular disease ([place]) 01/26/2009  . IRRITABLE BOWEL SYNDROME 01/26/2009  . Edema 09/02/2008  . Hyperlipidemia 08/26/2008  . Narcolepsy without cataplexy 08/26/2008  . Backache 06/23/2008  . Insomnia 12/11/2007  . Esophageal reflux 11/06/2007  . Depression, recurrent ([place]) 03/01/2007  . SYNDROME, RESTLESS LEGS 03/01/2007  . Essential hypertension 03/01/2007  . Coronary atherosclerosis 03/01/2007    Past Surgical History:  Procedure Laterality Date  . ABDOMINAL HYSTERECTOMY    . BACK SURGERY     x2  . CARDIAC CATHETERIZATION  07/2009  . CATARACT EXTRACTION     . COLONOSCOPY  11-13-07   per Dr. Deatra Ina, benign polyps, repeat in 5 yrs  . ESOPHAGOGASTRODUODENOSCOPY  11-13-07   per Dr. Deatra Ina, normal   . INTRAOCULAR LENS INSERTION    . LOOP RECORDER INSERTION N/A 07/23/2017   Procedure: LOOP RECORDER INSERTION;  Surgeon: Evans Lance, MD;  Location: [facility];  Service: Cardiovascular;  Laterality: N/A;  . SPINE SURGERY     x 3  . TONSILECTOMY, ADENOIDECTOMY, BILATERAL MYRINGOTOMY AND TUBES       OB History   No obstetric history on file.     Family History  Problem Relation Age of Onset  . Lupus Mother   . COPD Father     Social History   Tobacco Use  . Smoking status: Never Smoker  . Smokeless tobacco: Never Used  Substance Use Topics  . Alcohol use: No    Alcohol/week: 0.0 standard drinks  . Drug use: No    Types: Oxycodone    Home Medications Prior to Admission medications   Medication Sig Start Date End Date Taking? Authorizing Provider  clopidogrel (PLAVIX) 75 MG tablet TAKE 1 TABLET BY MOUTH EVERY DAY 10/06/19   Laurey Morale, MD  Continuous Blood Gluc Receiver (FREESTYLE LIBRE 14 DAY READER) DEVI 1 Device by Does not apply route daily. Use device daily as directed 10/24/19   Laurey Morale, MD  Continuous Blood Gluc Sensor (FREESTYLE LIBRE 14 DAY SENSOR) MISC 1 application by Does not apply route every 14 (fourteen) days. 10/31/19   Laurey Morale, MD  diclofenac Sodium (VOLTAREN) 1 % GEL Apply 2 g topically 4 (four) times daily. 08/15/19   [provider]  furosemide (LASIX) 40 MG tablet TAKE 2 TABLETS BY MOUTH TWICE DAILY 10/14/19   Laurey Morale, MD  gabapentin (NEURONTIN) 300 MG capsule TAKE 1 CAPSULE(300 MG) BY MOUTH THREE TIMES DAILY 10/13/19   Laurey Morale, MD  glucose blood (ONE TOUCH ULTRA TEST) test strip 1 each by Other route as needed for other. Use as instructed 06/21/18   Laurey Morale, MD  HUMALOG KWIKPEN 100 UNIT/ML KwikPen Inject 0.2 mLs (20 Units total) into the skin 3 (three) times daily  before meals. 11/01/18   Laurey Morale, MD  HYDROcodone-homatropine (HYDROMET) 5-1.5 MG/5ML syrup Take 5 mLs by mouth every 4 (four) hours as needed. 12/26/18   Laurey Morale, MD  Insulin Pen Needle (NOVOFINE) 32G X 6 MM MISC CHECK BLOOD GLUCOSE THREE TIMES DAILY 09/03/19   Laurey Morale, MD  meclizine (ANTIVERT) 25 MG tablet Take 1 tablet (25 mg total) by mouth 3 (three) times daily as needed  for dizziness. 10/21/19   Laurey Morale, MD  mupirocin ointment (BACTROBAN) 2 % mupirocin 2 % topical ointment  APP TO SORE ON RIGHT ARM BID FOR 2 WKS    [provider]  nitroGLYCERIN (NITROSTAT) 0.4 MG SL tablet Place 0.4 mg under the tongue every 5 (five) minutes as needed for chest pain.    [provider]  oxyCODONE-acetaminophen (PERCOCET) 10-325 MG tablet Take 1 tablet by mouth every 4 (four) hours as needed for up to 5 days for pain. 11/06/19 11/11/19  Laurey Morale, MD  phenazopyridine (PYRIDIUM) 200 MG tablet Take 1 tablet (200 mg total) by mouth 3 (three) times daily as needed (urinary pain). 04/11/19   Laurey Morale, MD  pramipexole (MIRAPEX) 1 MG tablet TAKE 4 TABLETS BY MOUTH ONCE DAILY 05/27/19   Laurey Morale, MD  promethazine (PHENERGAN) 25 MG tablet Take 1 tablet (25 mg total) by mouth every 4 (four) hours as needed for nausea or vomiting. 08/07/19   Laurey Morale, MD  spironolactone (ALDACTONE) 25 MG tablet TAKE 1 TABLET(25 MG) BY MOUTH TWICE DAILY 04/23/19   Laurey Morale, MD  terbinafine (LAMISIL) 250 MG tablet Take 1 tablet (250 mg total) by mouth daily. 06/07/18   Laurey Morale, MD  TRESIBA FLEXTOUCH 100 UNIT/ML SOPN FlexTouch Pen INJECT 80 UNITS INTO THE SKIN EVERY MORNING 10/14/19   Laurey Morale, MD  zolpidem (AMBIEN) 10 MG tablet TAKE 1 TABLET(10 MG) BY MOUTH AT BEDTIME AS NEEDED FOR SLEEP 05/21/19   Laurey Morale, MD    Allergies    Lyrica [pregabalin], Aspirin, Ciprofloxacin, Clarithromycin, Doxycycline, Erythromycin, Lisinopril, Macrobid [nitrofurantoin monohyd  macro], Metolazone, Other, Penicillins, Sulfonamide derivatives, and Valtrex [valacyclovir hcl]  Review of Systems   Review of Systems  Respiratory: Positive for shortness of breath.   All other systems reviewed and are negative.   Physical Exam Updated Vital Signs BP (!) 134/56   Pulse 90   Temp 97.9 F (36.6 C) (Oral)   Resp 17   Ht 5\' 9"  (1.753 m)   Wt 113.4 kg   SpO2 96%   BMI 36.92 kg/m   Physical Exam Vitals and nursing note reviewed.  Constitutional:      General: She is in acute distress.     Appearance: She is well-developed.  HENT:     Head: Normocephalic and atraumatic.     Mouth/Throat:     Mouth: Mucous membranes are dry.  Eyes:     Pupils: Pupils are equal, round, and reactive to light.  Cardiovascular:     Rate and Rhythm: Normal rate and regular rhythm.     Heart sounds: Normal heart sounds. No murmur. No friction rub.  Pulmonary:     Effort: Pulmonary effort is normal.     Breath sounds: Rales present. No wheezing.     Comments: Fine crackles present in the right upper middle and lower lobes.  Left lungs relatively clear. Abdominal:     General: Bowel sounds are normal. There is no distension.     Palpations: Abdomen is soft.     Tenderness: There is no abdominal tenderness. There is no guarding or rebound.  Musculoskeletal:        General: No tenderness. Normal range of motion.     Right lower leg: No edema.     Left lower leg: No edema.     Comments: Skin changes consistent with chronic venous stasis  Skin:    General: Skin is warm  and dry.     Findings: No rash.  Neurological:     Mental Status: She is alert and oriented to person, place, and time. Mental status is at baseline.     Cranial Nerves: No cranial nerve deficit.     Comments: Moves all ext with some difficulty due to pain but good effort  Psychiatric:        Mood and Affect: Mood normal.        Behavior: Behavior normal.        Thought Content: Thought content normal.     ED  Results / Procedures / Treatments   Labs (all labs ordered are listed, but only abnormal results are displayed) Labs Reviewed  COMPREHENSIVE METABOLIC PANEL - Abnormal; Notable for the following components:      Result Value   Glucose, Bld 166 (*)    Albumin 3.4 (*)    All other components within normal limits  D-DIMER, QUANTITATIVE (NOT AT [facility]) - Abnormal; Notable for the following components:   D-Dimer, Quant 1.82 (*)    All other components within normal limits  POCT I-STAT EG7 - Abnormal; Notable for the following components:   pO2, Ven 69.0 (*)    Bicarbonate 29.4 (*)    All other components within normal limits  TROPONIN I (HIGH SENSITIVITY) - Abnormal; Notable for the following components:   Troponin I (High Sensitivity) 28 (*)    All other components within normal limits  RESPIRATORY PANEL BY RT PCR (FLU A&B, COVID)  CBC WITH DIFFERENTIAL/PLATELET  LACTIC ACID, PLASMA  BRAIN NATRIURETIC PEPTIDE  URINALYSIS, ROUTINE W REFLEX MICROSCOPIC  I-STAT VENOUS BLOOD GAS, ED  TROPONIN I (HIGH SENSITIVITY)    EKG EKG Interpretation  Date/Time:  Friday November 07 2019 12:45:35 EST Ventricular Rate:  92 PR Interval:    QRS Duration: 88 QT Interval:  377 QTC Calculation: 467 R Axis:   36 Text Interpretation: Sinus rhythm Prolonged PR interval Consider left atrial enlargement Abnormal R-wave progression, early transition Baseline wander in lead(s) V6 No significant change since last tracing Confirmed by Blanchie Dessert [id number]) on 11/07/2019 12:47:46 PM   Radiology DG Chest Port 1 View  Result Date: 11/07/2019 CLINICAL DATA:  Shortness of breath EXAM: PORTABLE CHEST 1 VIEW COMPARISON:  03/12/2019 FINDINGS: The heart size and mediastinal contours are stable. Loop recorder device noted. Mild pulmonary vascular congestion. No focal consolidation, pleural effusion, or pneumothorax. The visualized skeletal structures are unremarkable. IMPRESSION: Mild cardiomegaly and pulmonary  vascular congestion without overt pulmonary edema. Electronically Signed   By: Davina Poke D.O.   On: 11/07/2019 13:59    Procedures Procedures (including critical care time)  Medications Ordered in ED Medications - No data to display  ED Course  I have reviewed the triage vital signs and the nursing notes.  Pertinent labs & imaging results that were available during my care of the patient were reviewed by me and considered in my medical decision making (see chart for details).    MDM Rules/Calculators/A&P                       Elderly female presenting now with multiple falls in the last few days.  Patient had another fall today.  Patient seen in the emergency room on 11/05/2019 after a fall where she tripped but imaging was negative.  Patient states she again fell this morning.  She has been feeling generally unwell for multiple days now and thinks she did not pick her  feet up when she was walking.  She normally walks with a walker but today when she fell she was not using her walker.  She denies any head injury or loss of consciousness.  Patient does remember the entire event.  She was also complaining of shortness of breath after the fall but denies any chest pain, palpitations or shortness of breath prior to falling.  Patient was found to be satting 80% on room air when EMS arrived.  Patient does not wear oxygen at home.  She does have a history of COPD and CHF but does not wear oxygen at home.  Patient currently is on nasal cannula and oxygen saturations are in the high 90s.  She does have fine crackles in the entire right lobe.  No significant wheezing and she is in no distress at this time.  She does not use inhalers at home.  She has no symptoms consistent with ACS and EKG is unchanged.  There is no significant signs of fluid overload on exam today.  Patient looks more on the dry side.  Concern for possible Covid versus PE versus pneumonia versus dehydration and AKI.  Patient is  complaining of diffuse pain and chronic pain in her back and legs but no significant new pain from the fall today.  Patient does take Plavix but again had no head injury.  Also patient has complained of feeling dizzy and being off balance now for a while.  Seems to be worse after taking gabapentin but patient is also taking oxycodone as needed for pain and reports she has been taking more than usual lately because she has had more pain. Labs and imaging pending.  Vital signs at this time are stable.  3:34 PM VBG without acute findings, BNP mildly elevated at 300, CBC within normal limits, CMP without acute findings.  D-dimer is elevated at 1.82 and troponin mildly elevated at 28.  Patient's EKG without acute changes at this time.  CTA of the chest to rule out PE pending.  Covid is negative.  final Clinical Impression(s) / ED Diagnoses Final diagnoses:  None    Rx / DC Orders ED Discharge Orders    None       Blanchie Dessert, MD 11/07/19 1535

## 2019-11-07 NOTE — Telephone Encounter (Signed)
Please advise in Dr. Barbie Banner absence.

## 2019-11-07 NOTE — ED Notes (Signed)
Son.... [id number] call  With an update

## 2019-11-07 NOTE — ED Provider Notes (Signed)
  Physical Exam  BP 128/63   Pulse 81   Temp 97.9 F (36.6 C) (Oral)   Resp 13   Ht 5\' 9"  (1.753 m)   Wt 113.4 kg   SpO2 98%   BMI 36.92 kg/m   Physical Exam  ED Course/Procedures     Procedures  MDM    Assuming care of patient from Dr. Maryan Rued.   Patient in the ED for for fall. Workup thus far shows normal labs tus far. She was hypoxic. She had complained of shortness of breath after the fall - when she was trying to ask for help. She has copd and chf hx. On exam she has rales, worse on the R side.  Concerning findings are as following : elevated dimer and hypoxia. Important pending results are CT PE.  According to Dr. Maryan Rued, plan is to admit patient for new hypoxia requiring O2.   Patient had no complains, no concerns from the nursing side. Will continue to monitor.        Varney Biles, MD 11/07/19 [phone]

## 2019-11-07 NOTE — ED Triage Notes (Signed)
Pt here from home via [facility] for SOB that began yesterday. Hx of CHF, COPD, and torn R rotator cuff r/t fall 2 days ago. Per EMS pt O2 sat on RA was 82%, placed on NRB at 15L, O2 sat increased to 100%. Pt AOx4, VSS

## 2019-11-07 NOTE — Telephone Encounter (Signed)
Pt's friend [facility] called back and stated they are taking her to the ER

## 2019-11-07 NOTE — Telephone Encounter (Signed)
Letter has been faxed.

## 2019-11-07 NOTE — ED Notes (Signed)
Pt transported to CT ?

## 2019-11-07 NOTE — H&P (Addendum)
History and Physical    DOA: 11/07/2019  PCP: Laurey Morale, MD  Patient coming from: home  Chief Complaint: Recurrent falls  HPI: Melanie Mcdonald is a 86 y.o. female with history h/o COPD, CAD, chronic diastolic CHF, chronic leg edema, diabetes mellitus on insulin, depression, chronic back pain and right shoulder pain who follows orthopedics Dr. Maxie Better as outpatient presents with recurrent falls.  Patient was seen 2 days prior for a fall and sent home after work-up negative.  She is on chronic opiates, gabapentin and other psych meds at baseline.  She states she recently underwent bilateral knee steroid injections for severe degenerative arthritis.  She states this morning after her son left for work after 5 AM she was locking the front door and reports she was shuffling while she walked and did not pick her foot up and tripped over the threshold of the door falling flat on her chest and abdomen.  She had to crawl into the kitchen and then pull herself up and called 911.She does report feeling lightheaded during walking at times.  Although she has had several falls, most of them have been related to tripping or legs giving away per patient.  She does report one episode of syncope a few weeks back when she was standing on side of the bed and EMS was apparently summoned.  She however did not present to the hospital at that time.  She denies any hypoglycemic events recently. She reports feeling dyspneic with wheezing occasionally on exertion and usually feels relieved after using her son's O2 via nasal cannula.  She denies being on home O2 herself previously.  She denies any cough or fevers or chills or recent Covid contacts. EMS reported that patient's oxygen saturation was 80% on room air and she was placed on a nonrebreather.  ED course: Afebrile, blood pressure 123/70, respiratory rate 93, pulse 94, O2 sat 97 to 100% on 2 L nasal cannula.  Lab work unremarkable with normal white count and normal renal  function.  VBG showed pH 7.33 (O2 sat reported at 92%-likely this was an arterial stick). Chest x-ray suggestive of mild pulmonary edema.  CT angio chest negative for PE but reported bibasilar atelectasis versus infiltrates.  Patient had x-rays of bilateral knees done on 2/3 which showed severe DJD but no acute fractures.  Patient received 1 dose of 125 mg IV Solu-Medrol in the ED and feels much better.   Review of Systems: As per HPI otherwise 10 point review of systems negative.    Past Medical History:  Diagnosis Date  . Abdominal pain, unspecified site 03/09/2014  . Acute on chronic diastolic CHF (congestive heart failure), NYHA class 3 ([place]) 11/03/2014   Acute on chronic diastolic heart failure  . Acute respiratory failure ([place]) 06/17/2018  . ANEMIA 01/30/2008  . ARTHRITIS 12/11/2007  . BACK PAIN, CHRONIC 06/23/2008  . Backache 06/23/2008   Qualifier: Diagnosis of  By: Niel Hummer MD, [place] CYST, RIGHT 12/17/2007  . Cancer ([place])   . Candidiasis of genitalia in female 07/20/2017  . Cellulitis of left leg 08/14/2011  . Cellulitis of right foot 06/13/2016  . Cervicogenic headache 06/14/2015  . Chest pain 06/08/2011   Atypical chest pain  . Chest pain 07/20/2017  . CHF (congestive heart failure) ([place])   . CHF (congestive heart failure), NYHA class II, chronic, systolic ([place]) [id number]  . Colon polyps    FRAGMENTS OF HYPERPLASTIC POLYP  . CONTACT DERMATITIS 03/10/2009  .  COPD (chronic obstructive pulmonary disease) ([place])   . CORONARY ARTERY DISEASE 03/01/2007   had a normal Myoview stress test 07-13-11  . Coronary atherosclerosis 03/01/2007   Qualifier: Diagnosis of  By: Cori Razor RN, Mikal Plane   . CVA (cerebral vascular accident) ([place]) 07/20/2017  . CYSTOCELE WITHOUT MENTION UTERINE PROLAPSE LAT 09/12/2010  . DEGENERATIVE JOINT DISEASE 08/07/2007  . DEPRESSION 03/01/2007  . Depression, recurrent ([place]) 03/01/2007   Qualifier: Diagnosis of  By: Cori Razor RN, Mikal Plane   . DIABETES MELLITUS,  TYPE II 08/26/2008  . Diabetic polyneuropathy associated with diabetes mellitus due to underlying condition ([place]) 06/14/2015  . Diarrhea 08/15/2011  . Diverticulosis of colon (without mention of hemorrhage)   . Dizziness 02/10/2014  . DOE (dyspnea on exertion) 11/03/2014  . Edema 09/02/2008   Centricity Description: EDEMA Qualifier: Diagnosis of  By: Niel Hummer MD, Lorinda Creed  Centricity Description: PERIPHERAL EDEMA Qualifier: Diagnosis of  By: Niel Hummer MD, Lorinda Creed   . Esophageal reflux 11/06/2007  . Essential hypertension 03/01/2007   Qualifier: Diagnosis of  By: Cori Razor RN, Mikal Plane   . Gait instability 11/13/2014  . Gout 06/07/2016  . Headache 12/30/2013  . HIP PAIN 09/15/2010   Qualifier: Diagnosis of  By: Elease Hashimoto MD, [place]    . HYPERKERATOSIS 06/02/2009  . HYPERLIPIDEMIA 08/26/2008  . Hyperlipidemia 08/26/2008   Qualifier: Diagnosis of  By: Cori Razor RN, Mikal Plane HYPERTENSION 03/01/2007  . Hypokalemia 08/15/2011  . Insomnia 12/11/2007  . Irritable bowel syndrome 01/26/2009  . LABYRINTHITIS 11/03/2009  . Labyrinthitis 11/03/2009   Qualifier: Diagnosis of  By: Niel Hummer MD, Lorinda Creed   . Leg swelling 04/05/2016  . Memory loss 12/30/2013  . Narcolepsy without cataplexy 08/26/2008   Qualifier: Diagnosis of  By: Niel Hummer MD, [place] Narcolepsy without cataplexy(347.00) 08/26/2008  . NEPHROLITHIASIS 04/29/2009  . OBESITY 03/01/2007  . Obstructive apnea 11/03/2014  . Osteoarthritis 03/15/2018  . Pain in joint, ankle and foot 12/24/2015  . PERIPHERAL VASCULAR DISEASE 01/26/2009  . Peripheral vascular disease ([place]) 01/26/2009   Qualifier: Diagnosis of  By: Niel Hummer MD, [place] history of colonic polyps 03/09/2014  . Stroke ([place])   . SYNCOPE 08/26/2008   had brain MRI on 06-28-12 showing only chronic microvascular ischemia and atrophy   . SYNDROME, RESTLESS LEGS 03/01/2007   Qualifier: Diagnosis of  By: Cori Razor RN, Mikal Plane Thyroid nodule 04/28/2014  . TIA (transient ischemic  attack) 11/13/2014  . TRANSIENT ISCHEMIC ATTACK 10/20/2009   had normal brain MRA with patent vertebrals and carotids 06-28-12  . Uncontrolled type 2 diabetes mellitus with hyperglycemia, with long-term current use of insulin ([place]) 10/09/2016  . Weakness generalized 11/13/2014    Past Surgical History:  Procedure Laterality Date  . ABDOMINAL HYSTERECTOMY    . BACK SURGERY     x2  . CARDIAC CATHETERIZATION  07/2009  . CATARACT EXTRACTION    . COLONOSCOPY  11-13-07   per Dr. Deatra Ina, benign polyps, repeat in 5 yrs  . ESOPHAGOGASTRODUODENOSCOPY  11-13-07   per Dr. Deatra Ina, normal   . INTRAOCULAR LENS INSERTION    . LOOP RECORDER INSERTION N/A 07/23/2017   Procedure: LOOP RECORDER INSERTION;  Surgeon: Evans Lance, MD;  Location: [facility];  Service: Cardiovascular;  Laterality: N/A;  . SPINE SURGERY     x 3  . TONSILECTOMY, ADENOIDECTOMY, BILATERAL MYRINGOTOMY AND TUBES      Social history:  reports that she has never smoked. She has never used smokeless tobacco. She reports that she does not drink alcohol or use drugs.   Allergies  Allergen Reactions  . Lyrica [Pregabalin] Other (See Comments)    Suicidal   . Aspirin Other (See Comments)    Abdominal pain  . Ciprofloxacin Nausea And Vomiting    Made pt very sick on stomach  . Clarithromycin Other (See Comments)    Made stomach "burn"  . Doxycycline Itching  . Erythromycin Nausea And Vomiting  . Lisinopril Cough  . Macrobid [Nitrofurantoin Monohyd Macro] Nausea And Vomiting  . Metolazone Other (See Comments)    Pt stated this made her B/P drop  . Other Nausea And Vomiting    Pt cannot have any of the -mycin(s)  . Penicillins Swelling    From childhood: swelling @ injection site,childhood allergy Has patient had a PCN reaction causing immediate rash, facial/tongue/throat swelling, SOB or lightheadedness with hypotension: Yes Has patient had a PCN reaction causing severe rash involving mucus membranes or skin necrosis:  No Has patient had a PCN reaction that required hospitalization: No Has patient had a PCN reaction occurring within the last 10 years: No If all of the above answers are "NO", then may proceed with Cephalosporin use.  . Sulfonamide Derivatives Nausea And Vomiting  . Valtrex [Valacyclovir Hcl] Nausea Only    Family History  Problem Relation Age of Onset  . Lupus Mother   . COPD Father       Prior to Admission medications   Medication Sig Start Date End Date Taking? Authorizing Provider  clopidogrel (PLAVIX) 75 MG tablet TAKE 1 TABLET BY MOUTH EVERY DAY 10/06/19   Laurey Morale, MD  Continuous Blood Gluc Receiver (FREESTYLE LIBRE 14 DAY READER) DEVI 1 Device by Does not apply route daily. Use device daily as directed 10/24/19   Laurey Morale, MD  Continuous Blood Gluc Sensor (FREESTYLE LIBRE 14 DAY SENSOR) MISC 1 application by Does not apply route every 14 (fourteen) days. 10/31/19   Laurey Morale, MD  diclofenac Sodium (VOLTAREN) 1 % GEL Apply 2 g topically 4 (four) times daily. 08/15/19   [provider]  furosemide (LASIX) 40 MG tablet TAKE 2 TABLETS BY MOUTH TWICE DAILY 10/14/19   Laurey Morale, MD  gabapentin (NEURONTIN) 300 MG capsule TAKE 1 CAPSULE(300 MG) BY MOUTH THREE TIMES DAILY 10/13/19   Laurey Morale, MD  glucose blood (ONE TOUCH ULTRA TEST) test strip 1 each by Other route as needed for other. Use as instructed 06/21/18   Laurey Morale, MD  HUMALOG KWIKPEN 100 UNIT/ML KwikPen Inject 0.2 mLs (20 Units total) into the skin 3 (three) times daily before meals. 11/01/18   Laurey Morale, MD  HYDROcodone-homatropine (HYDROMET) 5-1.5 MG/5ML syrup Take 5 mLs by mouth every 4 (four) hours as needed. 12/26/18   Laurey Morale, MD  Insulin Pen Needle (NOVOFINE) 32G X 6 MM MISC CHECK BLOOD GLUCOSE THREE TIMES DAILY 09/03/19   Laurey Morale, MD  meclizine (ANTIVERT) 25 MG tablet Take 1 tablet (25 mg total) by mouth 3 (three) times daily as needed for dizziness. 10/21/19   Laurey Morale, MD  mupirocin ointment (BACTROBAN) 2 % mupirocin 2 % topical ointment  APP TO SORE ON RIGHT ARM BID FOR 2 WKS    [provider]  nitroGLYCERIN (NITROSTAT) 0.4 MG SL tablet Place 0.4 mg under the tongue every 5 (five) minutes as needed for chest pain.  [provider]  oxyCODONE-acetaminophen (PERCOCET) 10-325 MG tablet Take 1 tablet by mouth every 4 (four) hours as needed for up to 5 days for pain. 11/06/19 11/11/19  Laurey Morale, MD  phenazopyridine (PYRIDIUM) 200 MG tablet Take 1 tablet (200 mg total) by mouth 3 (three) times daily as needed (urinary pain). 04/11/19   Laurey Morale, MD  pramipexole (MIRAPEX) 1 MG tablet TAKE 4 TABLETS BY MOUTH ONCE DAILY 05/27/19   Laurey Morale, MD  promethazine (PHENERGAN) 25 MG tablet Take 1 tablet (25 mg total) by mouth every 4 (four) hours as needed for nausea or vomiting. 08/07/19   Laurey Morale, MD  spironolactone (ALDACTONE) 25 MG tablet TAKE 1 TABLET(25 MG) BY MOUTH TWICE DAILY 04/23/19   Laurey Morale, MD  terbinafine (LAMISIL) 250 MG tablet Take 1 tablet (250 mg total) by mouth daily. 06/07/18   Laurey Morale, MD  TRESIBA FLEXTOUCH 100 UNIT/ML SOPN FlexTouch Pen INJECT 80 UNITS INTO THE SKIN EVERY MORNING 10/14/19   Laurey Morale, MD  zolpidem (AMBIEN) 10 MG tablet TAKE 1 TABLET(10 MG) BY MOUTH AT BEDTIME AS NEEDED FOR SLEEP 05/21/19   Laurey Morale, MD    Physical Exam: Vitals:   11/07/19 1516 11/07/19 1602 11/07/19 1615 11/07/19 1630  BP: 128/63 116/80 (!) 122/99 (!) 130/59  Pulse: 81 72 81 79  Resp: 13 13 20 12   Temp:      TempSrc:      SpO2: 98% 97% 99% 97%  Weight:      Height:        Constitutional: NAD, calm, comfortable, saturating well on 2 L O2 Eyes: PERRL, lids and conjunctivae normal ENMT: Mucous membranes are moist. Posterior pharynx clear of any exudate or lesions.Normal dentition.  Neck: normal, supple, no masses, no thyromegaly Respiratory: clear to auscultation bilaterally, decreased breath  sounds at bases but no wheezing or crackles currently. Normal respiratory effort. No accessory muscle use.  Cardiovascular: Regular rate and rhythm, no murmurs / rubs / gallops.  1+ pitting lower extremity edema. 2+ pedal pulses. No carotid bruits.  Abdomen: no tenderness, no masses palpated. No hepatosplenomegaly. Bowel sounds positive.  Musculoskeletal: Decreased range of motion along right shoulder joint, has lidocaine patch in place, decreased range of motion along bilateral knee joints (noted to have medication patches as well as Band-Aids at recent injection sites) Neurologic: CN 2-12 grossly intact. Sensation intact, DTR normal. Strength 5/5 in all 4.  Psychiatric: Normal judgment and insight. Alert and oriented x 3. Normal mood.  SKIN/catheters: no rashes, lesions, ulcers. No induration  Labs on Admission: I have personally reviewed following labs and imaging studies  CBC: Recent Labs  Lab 11/07/19 1344 11/07/19 1357  WBC 10.5  --   NEUTROABS 7.6  --   HGB 13.0 13.3  HCT 41.3 39.0  MCV 94.1  --   PLT 255  --    Basic Metabolic Panel: Recent Labs  Lab 11/07/19 1344 11/07/19 1357  NA 142 142  K 4.5 4.4  CL 107  --   CO2 28  --   GLUCOSE 166*  --   BUN 17  --   CREATININE 0.88  --   CALCIUM 8.9  --    GFR: Estimated Creatinine Clearance: 63.9 mL/min (by C-G formula based on SCr of 0.88 mg/dL). Recent Labs  Lab 11/07/19 1340 11/07/19 1344  WBC  --  10.5  LATICACIDVEN 1.0  --    Liver Function Tests: Recent Labs  Lab 11/07/19 1344  AST 20  ALT 18  ALKPHOS 74  BILITOT 0.4  PROT 6.9  ALBUMIN 3.4*   No results for input(s): LIPASE, AMYLASE in the last 168 hours. No results for input(s): AMMONIA in the last 168 hours. Coagulation Profile: No results for input(s): INR, PROTIME in the last 168 hours. Cardiac Enzymes: No results for input(s): CKTOTAL, CKMB, CKMBINDEX, TROPONINI in the last 168 hours. BNP (last 3 results) No results for input(s): PROBNP in  the last 8760 hours. HbA1C: No results for input(s): HGBA1C in the last 72 hours. CBG: No results for input(s): GLUCAP in the last 168 hours. Lipid Profile: No results for input(s): CHOL, HDL, LDLCALC, TRIG, CHOLHDL, LDLDIRECT in the last 72 hours. Thyroid Function Tests: No results for input(s): TSH, T4TOTAL, FREET4, T3FREE, THYROIDAB in the last 72 hours. Anemia Panel: No results for input(s): VITAMINB12, FOLATE, FERRITIN, TIBC, IRON, RETICCTPCT in the last 72 hours. Urine analysis:    Component Value Date/Time   COLORURINE YELLOW 07/19/2017 1949   APPEARANCEUR CLEAR 07/19/2017 1949   LABSPEC 1.030 07/19/2017 1949   PHURINE 6.0 07/19/2017 1949   GLUCOSEU >=500 (A) 07/19/2017 1949   HGBUR NEGATIVE 07/19/2017 1949   HGBUR trace-lysed 09/12/2010 1538   BILIRUBINUR - 09/05/2019 [place] 07/19/2017 1949   PROTEINUR Negative 09/05/2019 1607   PROTEINUR NEGATIVE 07/19/2017 1949   UROBILINOGEN 0.2 09/05/2019 1607   UROBILINOGEN 0.2 11/10/2014 1225   NITRITE - 09/05/2019 1607   NITRITE NEGATIVE 07/19/2017 1949   LEUKOCYTESUR Small (1+) (A) 09/05/2019 1607    Radiological Exams on Admission: Personally reviewed  CT Angio Chest PE W and/or Wo Contrast  Result Date: 11/07/2019 CLINICAL DATA:  Chest pain. EXAM: CT ANGIOGRAPHY CHEST WITH CONTRAST TECHNIQUE: Multidetector CT imaging of the chest was performed using the standard protocol during bolus administration of intravenous contrast. Multiplanar CT image reconstructions and MIPs were obtained to evaluate the vascular anatomy. CONTRAST:  160mL OMNIPAQUE IOHEXOL 350 MG/ML SOLN COMPARISON:  October 18, 2017 FINDINGS: Cardiovascular: There is mild calcification of the aortic arch. Satisfactory opacification of the pulmonary arteries to the segmental level. No evidence of pulmonary embolism. Normal heart size. No pericardial effusion. Mediastinum/Nodes: There is mild mediastinal lymphadenopathy. Lungs/Pleura: Mild areas of  atelectasis and/or infiltrate are seen within the posterior aspect of the bilateral upper lobes with moderate severity or areas of bilateral posterior lower lobe atelectasis and/or infiltrate. There are small bilateral pleural effusions. No pneumothorax is identified. Upper Abdomen: There is a small hiatal hernia. Noninflamed diverticula are seen within the splenic flexure. Musculoskeletal: Multilevel degenerative changes seen throughout the thoracic spine Review of the MIP images confirms the above findings. IMPRESSION: 1. No evidence of pulmonary embolism. 2. Mild areas of atelectasis and/or infiltrate are seen within the posterior aspect of the bilateral upper lobes with moderate severity bilateral posterior lower lobe atelectasis and/or infiltrate. 3. Small bilateral pleural effusions. Aortic Atherosclerosis (ICD10-I70.0). Electronically Signed   By: Virgina Norfolk M.D.   On: 11/07/2019 17:26   DG Chest Port 1 View  Result Date: 11/07/2019 CLINICAL DATA:  Shortness of breath EXAM: PORTABLE CHEST 1 VIEW COMPARISON:  03/12/2019 FINDINGS: The heart size and mediastinal contours are stable. Loop recorder device noted. Mild pulmonary vascular congestion. No focal consolidation, pleural effusion, or pneumothorax. The visualized skeletal structures are unremarkable. IMPRESSION: Mild cardiomegaly and pulmonary vascular congestion without overt pulmonary edema. Electronically Signed   By: Davina Poke D.O.   On: 11/07/2019 13:59    EKG: Independently  reviewed.  Normal sinus rhythm with poor R wave progression     Assessment and Plan:   Principal Problem:   Recurrent falls Active Problems:   COPD with acute exacerbation (HCC)   SYNDROME, RESTLESS LEGS   SYNCOPE   Obstructive apnea   Leg swelling   Uncontrolled type 2 diabetes mellitus with hyperglycemia, with long-term current use of insulin (HCC)   Osteoarthritis   Morbid obesity ([place])   Spinal stenosis of lumbar region    1.  Recurrent  falls: Likely multifactorial in the setting of advanced degenerative joint disease, hypoxia and multiple sedative use.  Patient states she tries to minimize Percocet and gabapentin use.  Takes Percocet only 1-2 times a day.  She states gabapentin makes her feel dizzy and she generally tries to avoid this medication but did take 1 pill for severe pain yesterday.  Will hold Ambien/gabapentin/hydromet, reduce Percocet frequency and check orthostatics.  She does feel somewhat dehydrated, will reduce Lasix dosage. PT eval  2.  Mild COPD exacerbation with acute versus chronic hypoxic respiratory failure: Patient feels much improved after receiving IV steroids in the ED.  Will place on prednisone and monitor response.  She likely has chronic hypoxia and needs home O2 set up prior to discharge.  CT chest negative for PE and shows atelectasis versus infiltrate.  Will order incentive spirometry/nebulizer as needed.  She does not have any fevers or white count, Covid PCR negative.  CT findings likely representative of atelectasis rather than pneumonia.  Avoid cough meds with hydrocodone.  3.  Syncopal event: Patient reports "blacking out" few weeks back.  Vagal versus orthostatic versus arrhythmia.  Check orthostatic blood pressures, monitor on telemetry.  CT head ordered through ER.  4.  Chronic diastolic CHF, leg edema: Takes Lasix 80 mg twice daily and Aldactone 25 twice daily at home.  Will reduce Lasix to 60 mg and resume Aldactone.  Resume other home medications.  5.  Diabetes mellitus, insulin-dependent: Patient on 20 units of short-acting insulin before meals and 80 units of long-acting insulin/Tresiba daily.  Noted that she was hypoglycemic in prior hospitalization.  Will reduce Premeal insulin to 10 units and long-acting insulin to 50 units for now.  Monitor Accu-Cheks and titrate as needed.  She denies any recent hypoglycemic events at home  6.  History of CVA: Patient reports chronic microvascular  ischemic disease and mild memory deficits at baseline.  Resume home medications including Plavix.?  Not on statins.  Lipid profile in a.m.  7.Chronic knee/shoulder/back pains: Patient has severe degenerative joint disease and underwent recent knee injections through outpatient orthopedic clinic, Dr. Ardyth Gal.  She does report worsening pain since fall this morning.  Although recent x-rays negative, will repeat given recurrent fall.  Resume lidocaine patch.  Patient reports chronic right shoulder pain due to rotator cuff tear from a previous fall which is worse than her knee pains and prompts her to take opiates.  PT eval in a.m.  Follow-up Ortho as outpatient.  8.  Restless leg syndrome: Resume pramipexole  9.  Obstructive sleep apnea: Resume CPAP.  DVT prophylaxis: Lovenox  COVID screen: Negative  Code Status: Full code   .Health care proxy would be her son [name] Patient/Family Communication: Discussed with patient and all questions answered to satisfaction.  Consults called: None at this time Admission status :Patient will be admitted under OBSERVATION status.The patient's presenting symptoms, physical exam findings, and initial radiographic and laboratory data in the context of their medical condition is  felt to place them at low risk for further clinical deterioration. Furthermore, it is anticipated that the patient will be medically stable for discharge from the hospital within 2 midnights of hospital stay.  Patient likely can be discharged in a.m. with home O2 set up, if pain improves, trauma work-up negative and tolerates medication changes.     Guilford Shi MD [facility] Pager in [place]  If 7PM-7AM, please contact night-coverage [email or web address]   11/07/2019, 8:34 PM

## 2019-11-08 DIAGNOSIS — R296 Repeated falls: Secondary | ICD-10-CM | POA: Diagnosis not present

## 2019-11-08 LAB — BASIC METABOLIC PANEL
Anion gap: 8 (ref 5–15)
BUN: 19 mg/dL (ref 8–23)
CO2: 25 mmol/L (ref 22–32)
Calcium: 8.5 mg/dL — ABNORMAL LOW (ref 8.9–10.3)
Chloride: 106 mmol/L (ref 98–111)
Creatinine, Ser: 0.86 mg/dL (ref 0.44–1.00)
GFR calc Af Amer: >60 mL/min (ref >60)
GFR calc non Af Amer: >60 mL/min (ref >60)
Glucose, Bld: 300 mg/dL — ABNORMAL HIGH (ref 70–99)
Potassium: 4.3 mmol/L (ref 3.5–5.1)
Sodium: 139 mmol/L (ref 135–145)

## 2019-11-08 LAB — CBC
HCT: 39.3 % (ref 36.0–46.0)
Hemoglobin: 12.6 g/dL (ref 12.0–15.0)
MCH: 29.4 pg (ref 26.0–34.0)
MCHC: 32.1 g/dL (ref 30.0–36.0)
MCV: 91.6 fL (ref 80.0–100.0)
Platelets: 210 10*3/uL (ref 150–400)
RBC: 4.29 MIL/uL (ref 3.87–5.11)
RDW: 13.6 % (ref 11.5–15.5)
WBC: 6.9 10*3/uL (ref 4.0–10.5)
nRBC: 0 % (ref 0.0–0.2)

## 2019-11-08 LAB — LIPID PANEL
Cholesterol: 139 mg/dL (ref 0–200)
HDL: 60 mg/dL (ref >40)
LDL Cholesterol: 74 mg/dL (ref 0–99)
Total CHOL/HDL Ratio: 2.3 RATIO
Triglycerides: 25 mg/dL (ref <150)
VLDL: 5 mg/dL (ref 0–40)

## 2019-11-08 LAB — GLUCOSE, CAPILLARY
Glucose-Capillary: 249 mg/dL — ABNORMAL HIGH (ref 70–99)
Glucose-Capillary: 344 mg/dL — ABNORMAL HIGH (ref 70–99)
Glucose-Capillary: 260 mg/dL — ABNORMAL HIGH (ref 70–99)
Glucose-Capillary: 272 mg/dL — ABNORMAL HIGH (ref 70–99)

## 2019-11-08 LAB — HEMOGLOBIN A1C
Hgb A1c MFr Bld: 8.7 % — ABNORMAL HIGH (ref 4.8–5.6)
Mean Plasma Glucose: 202.99 mg/dL

## 2019-11-08 MED ORDER — LIDOCAINE 5 % EX PTCH
1.0000 | MEDICATED_PATCH | CUTANEOUS | Status: DC
  Administered 2019-11-08 – 2019-11-11 (×4): 1 via TRANSDERMAL
  Filled 2019-11-08 (×4): qty 1

## 2019-11-08 NOTE — TOC Initial Note (Addendum)
Transition of Care [facility]) - Initial/Assessment Note    Patient Details  Name: Melanie Mcdonald MRN: [id number] Date of Birth: 1933/01/24  Transition of Care [facility]) CM/SW Contact:    Claudie Leach, RN 11/08/2019, 5:30 PM  Clinical Narrative:                 Spoke to patient and son regarding home situation and dc plans.  Patient lives with son and is alone 8-10 hours/day while he works.  He is gone every other weekend and a friend stays with patient on those days.  Patient is requesting SNF admission for therapy to "get stronger" due to recent falls.  Son agrees that patient would benefit from SNF stay.  Patient has been at [facility] in the past.  Patient states she did not like the facility, but the therapy was good and she would agree to go back.   Patient uses a walker, 3n1, shower seat at home.  Also, [facility] agency has been used for [facility] PT/OT although not recently.    TOC will initiate SNF process as PT recommends SNF and patient is agreeable.   Expected Discharge Plan: Skilled Nursing Facility Barriers to Discharge: No SNF bed   Patient Goals and CMS Choice Patient states their goals for this hospitalization and ongoing recovery are:: to get stronger      Expected Discharge Plan and Services Expected Discharge Plan: [place]       Living arrangements for the past 2 months: Single Family Home                   Prior Living Arrangements/Services Living arrangements for the past 2 months: Single Family Home Lives with:: Adult Children(Son) Patient language and need for interpreter reviewed:: Yes Do you feel safe going back to the place where you live?: Yes      Need for Family Participation in Patient Care: Yes (Comment) Care giver support system in place?: Yes (comment) Current home services: DME Criminal Activity/Legal Involvement Pertinent to Current Situation/Hospitalization: No - Comment as needed  Activities of Daily Living Home  Assistive Devices/Equipment: None ADL Screening (condition at time of admission) Patient's cognitive ability adequate to safely complete daily activities?: Yes Is the patient deaf or have difficulty hearing?: No Does the patient have difficulty seeing, even when wearing glasses/contacts?: No Does the patient have difficulty concentrating, remembering, or making decisions?: No Patient able to express need for assistance with ADLs?: Yes Does the patient have difficulty dressing or bathing?: No Independently performs ADLs?: Yes (appropriate for developmental age) Does the patient have difficulty walking or climbing stairs?: Yes Weakness of Legs: Both Weakness of Arms/Hands: None  Permission Sought/Granted Permission sought to share information with : Other (comment)([place] SNFs) Permission granted to share information with : Yes, Verbal Permission Granted              Emotional Assessment Appearance:: Appears younger than stated age Attitude/Demeanor/Rapport: Other (comment)(anxious, frustrated) Affect (typically observed): Anxious Orientation: : Oriented to Self, Oriented to Place, Oriented to  Time, Oriented to Situation Alcohol / Substance Use: Not Applicable Psych Involvement: No (comment)  Admission diagnosis:  COPD exacerbation ([place]) [J44.1] Recurrent falls [R29.6] Syncope, unspecified syncope type [R55] Patient Active Problem List   Diagnosis Date Noted  . Recurrent falls 11/07/2019  . COPD with acute exacerbation ([place]) 11/07/2019  . Lymphedema of lower extremity 08/15/2019  . Degeneration of lumbar intervertebral disc 05/09/2019  . Spinal stenosis of lumbar region 05/09/2019  .  Morbid obesity ([place]) 04/11/2019  . COPD with emphysema ([place]) 04/11/2019  . Trigger little finger of left hand 11/20/2018  . Osteoarthritis 03/15/2018  . CHF (congestive heart failure), NYHA class II, chronic, systolic ([place]) 123456  . CVA (cerebral vascular accident) ([place]) 07/20/2017  .  Candidiasis of genitalia in female 07/20/2017  . Uncontrolled type 2 diabetes mellitus with hyperglycemia, with long-term current use of insulin ([place]) 10/09/2016  . Gout 06/07/2016  . Leg swelling 04/05/2016  . Pain in joint, ankle and foot 12/24/2015  . Diabetic polyneuropathy associated with diabetes mellitus due to underlying condition ([place]) 06/14/2015  . Cervicogenic headache 06/14/2015  . Weakness generalized 11/13/2014  . TIA (transient ischemic attack) 11/13/2014  . Gait instability 11/13/2014  . Acute on chronic diastolic CHF (congestive heart failure), NYHA class 3 ([place]) 11/03/2014  . Obstructive apnea 11/03/2014  . DOE (dyspnea on exertion) 11/03/2014  . Thyroid nodule 04/28/2014  . Personal history of colonic polyps 03/09/2014  . Dizziness 02/10/2014  . Memory loss 12/30/2013  . Headache 12/30/2013  . Hypokalemia 08/15/2011  . Diarrhea 08/15/2011  . Chest pain 06/08/2011  . HIP PAIN 09/15/2010  . CYSTOCELE WITHOUT MENTION UTERINE PROLAPSE LAT 09/12/2010  . Labyrinthitis 11/03/2009  . NEPHROLITHIASIS 04/29/2009  . Peripheral vascular disease ([place]) 01/26/2009  . IRRITABLE BOWEL SYNDROME 01/26/2009  . Edema 09/02/2008  . Hyperlipidemia 08/26/2008  . Narcolepsy without cataplexy 08/26/2008  . SYNCOPE 08/26/2008  . Backache 06/23/2008  . Insomnia 12/11/2007  . Esophageal reflux 11/06/2007  . Depression, recurrent ([place]) 03/01/2007  . SYNDROME, RESTLESS LEGS 03/01/2007  . Essential hypertension 03/01/2007  . Coronary atherosclerosis 03/01/2007   PCP:  Laurey Morale, MD Pharmacy:   [facility] [address], [place] [address] Phone: [phone] Fax: [phone]     Social Determinants of Health (SDOH) Interventions    Readmission Risk Interventions No flowsheet data found.

## 2019-11-08 NOTE — Progress Notes (Signed)
[facility]                                                                              Patient Demographics  Melanie Mcdonald, is a 86 y.o. female, DOB - Oct 08, 1932, [id number]  Admit date - 11/07/2019   Admitting Physician Guilford Shi, MD  Outpatient Primary MD for the patient is Laurey Morale, MD  Outpatient specialists:   LOS - 0  days    Chief Complaint  Patient presents with  . Shortness of Breath       Brief summary  Melanie Mcdonald is a 86 y.o. female with multiple medical history including but not limited to COPD, CAD, chronic diastolic CHF, and chronic back pain, on chronic opiates gabapentin was added psychiatric medications at home, presenting with recurrent falls associated with increased wheezing with shortness of breath from baseline.  Patient was hypoxic with O2 saturation of 80% on room air by EMS required nonrebreather mask.  At the ED chest x-ray noted mild pulmonary edema with CT scan negative for PE but noted bibasilar atelectasis versus infiltrates.  She was given a dose of IV steroid with improvement and patient admitted for recurrent falls associated with mild COPD exacerbation secondary to pneumonia  Assessment & Plan    Principal Problem:   Recurrent falls Active Problems:   SYNDROME, RESTLESS LEGS   SYNCOPE   Obstructive apnea   Leg swelling   Uncontrolled type 2 diabetes mellitus with hyperglycemia, with long-term current use of insulin (HCC)   Osteoarthritis   Morbid obesity (HCC)   Spinal stenosis of lumbar region   COPD with acute exacerbation (HCC)  Recurrent falls -Etiology-multifactorial including advanced DJD, acute mild COPD exacerbation with multiple sedative hypnotic including Ambien, gabapentin, Hydromet with opiates, as well as mild dehydration on admission. -Ambien/gabapentin/Hydromet head and frequency of Percocet reduced. -Dose of diuretic cut back admission -PT/OT to eval and treat as needed  Mild COPD  exacerbation  -Improved in the ED with IV steroids -Multiple allergies to respiratory antibiotics; patient afebrile without leukocytosis (no antibiotic for now) -Chest CT negative for obvious pneumonic infiltrate -Covid negative -Transitioned from IV steroids to ED to prednisone on admission -Nebulized bronchodilators with incentive spirometry  Chronic diastolic CHF Continue diuresis with monitor electrolytes and renal function with reduced dose  Insulin-dependent diabetes mellitus Continue insulin Monitor CBGs and adjust as needed      Code Status: Full code DVT Prophylaxis:  Lovenox  Family Communication: Discussed in detail with the patient, all imaging results, lab results explained to the   Disposition Plan: To be determined  Time Spent in minutes 35 minutes  Procedures:    Consultants:     Antimicrobials:      Medications  Scheduled Meds: . clopidogrel  75 mg Oral Daily  . enoxaparin (LOVENOX) injection  40 mg Subcutaneous Q24H  . furosemide  60 mg Oral BID  . insulin aspart  0-15 Units Subcutaneous TID WC  . insulin aspart  10 Units Subcutaneous TID WC  . insulin detemir  50 Units Subcutaneous Daily  . lidocaine  1 patch Transdermal Q24H  . pramipexole  4 mg Oral  QHS  . predniSONE  50 mg Oral Q breakfast  . spironolactone  25 mg Oral BID  . terbinafine  250 mg Oral Daily   Continuous Infusions: . sodium chloride 50 mL/hr at 11/08/19 0400   PRN Meds:.acetaminophen **OR** acetaminophen, ipratropium-albuterol, nitroGLYCERIN, ondansetron **OR** ondansetron (ZOFRAN) IV, oxyCODONE-acetaminophen, phenazopyridine, polyethylene glycol, zolpidem   Antibiotics   Anti-infectives (From admission, onward)   Start     Dose/Rate Route Frequency Ordered Stop   11/08/19 1000  terbinafine (LAMISIL) tablet 250 mg     250 mg Oral Daily 11/07/19 1913          Subjective:   Melanie Mcdonald was seen and examined today.  She denies any fever or chills.  She is  breathing a lot better  Objective:   Vitals:   11/07/19 2141 11/08/19 0329 11/08/19 0752 11/08/19 1226  BP: (!) 156/60 132/60 (!) 136/59 (!) 122/59  Pulse: 85 79 76 75  Resp:   18 18  Temp: 98.8 F (37.1 C) 98.1 F (36.7 C) 97.8 F (36.6 C) 98.6 F (37 C)  TempSrc: Oral Oral Oral Oral  SpO2: 94% 95% 99% 99%  Weight:      Height:        Intake/Output Summary (Last 24 hours) at 11/08/2019 1232 Last data filed at 11/08/2019 1146 Gross per 24 hour  Intake 1125.47 ml  Output 600 ml  Net 525.47 ml     Wt Readings from Last 3 Encounters:  11/07/19 113.4 kg  05/13/19 107.5 kg  04/11/19 110 kg     Exam  General: NAD  HEENT: NCAT,  PERRL,MMM  Neck: SUPPLE, (-) JVD  Cardiovascular: RRR, (-) GALLOP, (-) MURMUR  Respiratory: Mildly decreased breath sounds at the bases with few occasional bilateral wheezing  Gastrointestinal: SOFT, (-) DISTENSION, BS(+), (_) TENDERNESS  Ext: (-) CYANOSIS, (-) EDEMA  Neuro: A, OX 3  Skin:(-) RASH  Psych:NORMAL AFFECT/MOOD   Data Reviewed:  I have personally reviewed following labs and imaging studies  Micro Results Recent Results (from the past 240 hour(s))  Respiratory Panel by RT PCR (Flu A&B, Covid) - Nasopharyngeal Swab     Status: None   Collection Time: 11/07/19  1:32 PM   Specimen: Nasopharyngeal Swab  Result Value Ref Range Status   SARS Coronavirus 2 by RT PCR NEGATIVE NEGATIVE Final    Comment: (NOTE) SARS-CoV-2 target nucleic acids are NOT DETECTED. The SARS-CoV-2 RNA is generally detectable in upper respiratoy specimens during the acute phase of infection. The lowest concentration of SARS-CoV-2 viral copies this assay can detect is 131 copies/mL. A negative result does not preclude SARS-Cov-2 infection and should not be used as the sole basis for treatment or other patient management decisions. A negative result may occur with  improper specimen collection/handling, submission of specimen other than nasopharyngeal  swab, presence of viral mutation(s) within the areas targeted by this assay, and inadequate number of viral copies (<131 copies/mL). A negative result must be combined with clinical observations, patient history, and epidemiological information. The expected result is Negative. Fact Sheet for Patients:  PinkCheek.be Fact Sheet for Healthcare Providers:  GravelBags.it This test is not yet ap proved or cleared by the [place] FDA and  has been authorized for detection and/or diagnosis of SARS-CoV-2 by FDA under an Emergency Use Authorization (EUA). This EUA will remain  in effect (meaning this test can be used) for the duration of the COVID-19 declaration under Section 564(b)(1) of the Act, 21 U.S.C. section 360bbb-3(b)(1), unless the  authorization is terminated or revoked sooner.    Influenza A by PCR NEGATIVE NEGATIVE Final   Influenza B by PCR NEGATIVE NEGATIVE Final    Comment: (NOTE) The Xpert Xpress SARS-CoV-2/FLU/RSV assay is intended as an aid in  the diagnosis of influenza from Nasopharyngeal swab specimens and  should not be used as a sole basis for treatment. Nasal washings and  aspirates are unacceptable for Xpert Xpress SARS-CoV-2/FLU/RSV  testing. Fact Sheet for Patients: PinkCheek.be Fact Sheet for Healthcare Providers: GravelBags.it This test is not yet approved or cleared by the [place] FDA and  has been authorized for detection and/or diagnosis of SARS-CoV-2 by  FDA under an Emergency Use Authorization (EUA). This EUA will remain  in effect (meaning this test can be used) for the duration of the  Covid-19 declaration under Section 564(b)(1) of the Act, 21  U.S.C. section 360bbb-3(b)(1), unless the authorization is  terminated or revoked. Performed at [facility], [place] [address], [place], [place] [address]     Radiology Reports DG  Knee 1-2 Views Left  Result Date: 11/07/2019 CLINICAL DATA:  Pain EXAM: LEFT KNEE - 1-2 VIEW COMPARISON:  None. FINDINGS: There is no acute displaced fracture or dislocation. There are mild tricompartmental degenerative changes. There is nonspecific soft tissue swelling of the lower extremity. IMPRESSION: 1. No acute displaced fracture or dislocation. 2. Mild tricompartmental degenerative changes. Electronically Signed   By: Constance Holster M.D.   On: 11/07/2019 23:54   DG Knee 1-2 Views Right  Result Date: 11/07/2019 CLINICAL DATA:  Pain EXAM: RIGHT KNEE - 1-2 VIEW COMPARISON:  None. FINDINGS: There is no acute displaced fracture. No dislocation. There are mild tricompartmental degenerative changes. There is no significant joint effusion. There is nonspecific soft tissue swelling about the lower extremity. IMPRESSION: 1. No acute displaced fracture or dislocation. 2. Mild tricompartmental degenerative changes. Electronically Signed   By: Constance Holster M.D.   On: 11/07/2019 23:54   CT Head Wo Contrast  Result Date: 11/07/2019 CLINICAL DATA:  Syncope. EXAM: CT HEAD WITHOUT CONTRAST TECHNIQUE: Contiguous axial images were obtained from the base of the skull through the vertex without intravenous contrast. COMPARISON:  None. FINDINGS: Brain: There is mild cerebral atrophy with widening of the extra-axial spaces and ventricular dilatation. There are areas of decreased attenuation within the white matter tracts of the supratentorial brain, consistent with microvascular disease changes. Vascular: No hyperdense vessel or unexpected calcification. Skull: Normal. Negative for fracture or focal lesion. Sinuses/Orbits: No acute finding. Other: None. IMPRESSION: No acute intracranial pathology. Electronically Signed   By: Virgina Norfolk M.D.   On: 11/07/2019 20:34   CT Head Wo Contrast  Result Date: 11/05/2019 CLINICAL DATA:  Syncope 2.5 weeks ago, fell this morning EXAM: CT HEAD WITHOUT CONTRAST TECHNIQUE:  Contiguous axial images were obtained from the base of the skull through the vertex without intravenous contrast. COMPARISON:  11/21/2018 FINDINGS: Brain: No acute infarct or hemorrhage. Stable chronic small vessel ischemic changes within the periventricular white matter. Chronic small left cerebellar infarct. Lateral ventricles and midline structures are unremarkable. No acute extra-axial fluid collections. No mass effect. Vascular: No hyperdense vessel or unexpected calcification. Skull: Normal. Negative for fracture or focal lesion. Sinuses/Orbits: Mild polypoid mucosal thickening within the left frontal ethmoidal recess. Remaining sinuses are clear. Other: None IMPRESSION: 1. Stable chronic ischemic changes.  No acute intracranial process. Electronically Signed   By: Randa Ngo M.D.   On: 11/05/2019 09:00   CT Angio Chest PE W  and/or Wo Contrast  Result Date: 11/07/2019 CLINICAL DATA:  Chest pain. EXAM: CT ANGIOGRAPHY CHEST WITH CONTRAST TECHNIQUE: Multidetector CT imaging of the chest was performed using the standard protocol during bolus administration of intravenous contrast. Multiplanar CT image reconstructions and MIPs were obtained to evaluate the vascular anatomy. CONTRAST:  149mL OMNIPAQUE IOHEXOL 350 MG/ML SOLN COMPARISON:  October 18, 2017 FINDINGS: Cardiovascular: There is mild calcification of the aortic arch. Satisfactory opacification of the pulmonary arteries to the segmental level. No evidence of pulmonary embolism. Normal heart size. No pericardial effusion. Mediastinum/Nodes: There is mild mediastinal lymphadenopathy. Lungs/Pleura: Mild areas of atelectasis and/or infiltrate are seen within the posterior aspect of the bilateral upper lobes with moderate severity or areas of bilateral posterior lower lobe atelectasis and/or infiltrate. There are small bilateral pleural effusions. No pneumothorax is identified. Upper Abdomen: There is a small hiatal hernia. Noninflamed diverticula are seen  within the splenic flexure. Musculoskeletal: Multilevel degenerative changes seen throughout the thoracic spine Review of the MIP images confirms the above findings. IMPRESSION: 1. No evidence of pulmonary embolism. 2. Mild areas of atelectasis and/or infiltrate are seen within the posterior aspect of the bilateral upper lobes with moderate severity bilateral posterior lower lobe atelectasis and/or infiltrate. 3. Small bilateral pleural effusions. Aortic Atherosclerosis (ICD10-I70.0). Electronically Signed   By: Virgina Norfolk M.D.   On: 11/07/2019 17:26   DG Chest Port 1 View  Result Date: 11/07/2019 CLINICAL DATA:  Shortness of breath EXAM: PORTABLE CHEST 1 VIEW COMPARISON:  03/12/2019 FINDINGS: The heart size and mediastinal contours are stable. Loop recorder device noted. Mild pulmonary vascular congestion. No focal consolidation, pleural effusion, or pneumothorax. The visualized skeletal structures are unremarkable. IMPRESSION: Mild cardiomegaly and pulmonary vascular congestion without overt pulmonary edema. Electronically Signed   By: Davina Poke D.O.   On: 11/07/2019 13:59   DG Knee Complete 4 Views Left  Result Date: 11/05/2019 CLINICAL DATA:  [place] this morning, knee pain EXAM: LEFT KNEE - COMPLETE 4+ VIEW COMPARISON:  None. FINDINGS: Frontal, bilateral oblique, lateral views of the left knee demonstrate 3 compartment osteoarthritis, most significant in the medial compartment with joint space narrowing and osteophyte formation. No fracture, subluxation, or dislocation. No joint effusion. IMPRESSION: 1. Prominent medial compartmental osteoarthritis. Less significant changes in the patellofemoral and lateral compartments. 2. No acute fracture. Electronically Signed   By: Randa Ngo M.D.   On: 11/05/2019 08:48   DG Knee Complete 4 Views Right  Result Date: 11/05/2019 CLINICAL DATA:  [place], bilateral knee pain EXAM: RIGHT KNEE - COMPLETE 4+ VIEW COMPARISON:  None. FINDINGS: Frontal,  bilateral oblique, lateral views of the right knee are obtained. Moderate 3 compartmental osteoarthritis greatest in the medial compartment. Prominent joint space narrowing and osteophyte formation. No acute fracture, subluxation, or dislocation. No joint effusion. IMPRESSION: 1. Moderate 3 compartment osteoarthritis, greatest medially. 2. No acute fracture. Electronically Signed   By: Randa Ngo M.D.   On: 11/05/2019 08:47   CUP PACEART REMOTE DEVICE CHECK  Result Date: 10/16/2019 Carelink summary report received. Battery status OK. Normal device function. No new symptom episodes, tachy episodes, brady, or pause episodes. No new AF episodes. Monthly summary reports and ROV/PRN Kathy Breach, RN, CCDS, [employer]  DG Hips Bilat W or Wo Pelvis 2 Views  Result Date: 11/07/2019 CLINICAL DATA:  Pain status post fall EXAM: DG HIP (WITH OR WITHOUT PELVIS) 2V BILAT COMPARISON:  None. FINDINGS: There is no evidence of hip fracture or dislocation. There is no evidence of  arthropathy or other focal bone abnormality. IMPRESSION: Negative. Electronically Signed   By: Constance Holster M.D.   On: 11/07/2019 23:53    Lab Data:  CBC: Recent Labs  Lab 11/07/19 1344 11/07/19 1357 11/08/19 0616  WBC 10.5  --  6.9  NEUTROABS 7.6  --   --   HGB 13.0 13.3 12.6  HCT 41.3 39.0 39.3  MCV 94.1  --  91.6  PLT 255  --  [id number]   Basic Metabolic Panel: Recent Labs  Lab 11/07/19 1344 11/07/19 1357 11/08/19 0616  NA 142 142 139  K 4.5 4.4 4.3  CL 107  --  106  CO2 28  --  25  GLUCOSE 166*  --  300*  BUN 17  --  19  CREATININE 0.88  --  0.86  CALCIUM 8.9  --  8.5*   GFR: Estimated Creatinine Clearance: 65.4 mL/min (by C-G formula based on SCr of 0.86 mg/dL). Liver Function Tests: Recent Labs  Lab 11/07/19 1344  AST 20  ALT 18  ALKPHOS 74  BILITOT 0.4  PROT 6.9  ALBUMIN 3.4*   No results for input(s): LIPASE, AMYLASE in the last 168 hours. No results for input(s): AMMONIA in the last  168 hours. Coagulation Profile: No results for input(s): INR, PROTIME in the last 168 hours. Cardiac Enzymes: No results for input(s): CKTOTAL, CKMB, CKMBINDEX, TROPONINI in the last 168 hours. BNP (last 3 results) No results for input(s): PROBNP in the last 8760 hours. HbA1C: Recent Labs    11/08/19 0616  HGBA1C 8.7*   CBG: Recent Labs  Lab 11/07/19 2147 11/08/19 0638 11/08/19 1127  GLUCAP 185* 249* 344*   Lipid Profile: Recent Labs    11/08/19 0616  CHOL 139  HDL 60  LDLCALC 74  TRIG 25  CHOLHDL 2.3   Thyroid Function Tests: No results for input(s): TSH, T4TOTAL, FREET4, T3FREE, THYROIDAB in the last 72 hours. Anemia Panel: No results for input(s): VITAMINB12, FOLATE, FERRITIN, TIBC, IRON, RETICCTPCT in the last 72 hours. Urine analysis:    Component Value Date/Time   COLORURINE YELLOW 07/19/2017 1949   APPEARANCEUR CLEAR 07/19/2017 1949   LABSPEC 1.030 07/19/2017 1949   PHURINE 6.0 07/19/2017 1949   GLUCOSEU >=500 (A) 07/19/2017 1949   HGBUR NEGATIVE 07/19/2017 1949   HGBUR trace-lysed 09/12/2010 1538   BILIRUBINUR - 09/05/2019 [place] 07/19/2017 1949   PROTEINUR Negative 09/05/2019 1607   PROTEINUR NEGATIVE 07/19/2017 1949   UROBILINOGEN 0.2 09/05/2019 1607   UROBILINOGEN 0.2 11/10/2014 1225   NITRITE - 09/05/2019 1607   NITRITE NEGATIVE 07/19/2017 1949   LEUKOCYTESUR Small (1+) (A) 09/05/2019 1607     Benito Mccreedy M.D. [facility] 11/08/2019, 12:32 PM  Pager: [phone] Between 7am to 7pm - call Pager - [phone]  After 7pm go to [email or web address] - password [email or web address]  Call night coverage person covering after 7pm

## 2019-11-08 NOTE — Evaluation (Signed)
Physical Therapy Evaluation Patient Details Name: Melanie SUNDERHAUS MRN: [id number] DOB: Aug 18, 1933 Today's Date: 11/08/2019   History of Present Illness  Pt is an 86 y/o female admitted secondary to recurrent falls at home. All imaging was negative for any acute findings. PMH including but not limited to CAD, CHF, COPD, CVA, DM, and HTN.    Clinical Impression  Pt presented supine in bed with HOB elevated, awake and willing to participate in therapy session. Prior to admission, pt reported that she was ambulating very short distances with a rollator but mostly staying in bed and using a BSC. Pt lives with her son but he works during the day and she is alone when he is gone. At the time of evaluation, pt significantly limited in mobility secondary to generalized weakness, deconditioning, fatigue and pain. She required mod A for bed mobility and transfers. She was only able to take side steps at EOB with use of RW and mod A. She would greatly benefit from further intensive therapy services at a SNF to maximize her independence with functional mobility prior to returning home with family support. Pt would continue to benefit from skilled physical therapy services at this time while admitted and after d/c to address the below listed limitations in order to improve overall safety and independence with functional mobility.     Follow Up Recommendations SNF    Equipment Recommendations  None recommended by PT    Recommendations for Other Services       Precautions / Restrictions Precautions Precautions: Fall Restrictions Weight Bearing Restrictions: No      Mobility  Bed Mobility Overal bed mobility: Needs Assistance Bed Mobility: Supine to Sit;Sit to Supine     Supine to sit: Mod assist Sit to supine: Mod assist   General bed mobility comments: increased time and effort, assistance needed for bilateral LE movement off of and back onto bed, assistance needed for trunk elevation as  well  Transfers Overall transfer level: Needs assistance Equipment used: Rolling walker (2 wheeled) Transfers: Sit to/from Stand Sit to Stand: Mod assist         General transfer comment: cueing for safe hand placement, assistance to power into standing and for stability with transitions as pt with heavy posterior lean  Ambulation/Gait             General Gait Details: pt only able to tolerate taking several small side steps at EOB towards pt's L side towards HOB with min-mod A for stability and RW  Stairs            Wheelchair Mobility    Modified Rankin (Stroke Patients Only)       Balance Overall balance assessment: Needs assistance Sitting-balance support: Feet supported Sitting balance-Leahy Scale: Fair     Standing balance support: Bilateral upper extremity supported Standing balance-Leahy Scale: Poor                               Pertinent Vitals/Pain Pain Assessment: Faces Faces Pain Scale: Hurts even more Pain Location: R shoulder, bilateral knees Pain Descriptors / Indicators: Grimacing Pain Intervention(s): Monitored during session;Repositioned    Home Living Family/patient expects to be discharged to:: Private residence Living Arrangements: Children Available Help at Discharge: Family;Friend(s);Available PRN/intermittently Type of Home: House Home Access: Stairs to enter   Entrance Stairs-Number of Steps: 1   Home Equipment: [place] - 4 wheels;Bedside commode;Shower seat;Hospital bed      Prior  Function Level of Independence: Needs assistance   Gait / Transfers Assistance Needed: pt reported that she ambulates with use of a rollator but has mostly been staying in bed recently as she feels she has gotten very weak  ADL's / Homemaking Assistance Needed: Pt reporting that she can bathe herself but its very difficult and takes a very long time        Hand Dominance        Extremity/Trunk Assessment   Upper Extremity  Assessment Upper Extremity Assessment: Generalized weakness    Lower Extremity Assessment Lower Extremity Assessment: Generalized weakness       Communication   Communication: No difficulties  Cognition Arousal/Alertness: Awake/alert Behavior During Therapy: WFL for tasks assessed/performed Overall Cognitive Status: Within Functional Limits for tasks assessed                                        General Comments      Exercises     Assessment/Plan    PT Assessment Patient needs continued PT services  PT Problem List Decreased strength;Decreased range of motion;Decreased activity tolerance;Decreased balance;Decreased coordination;Decreased mobility;Decreased knowledge of use of DME;Decreased safety awareness;Decreased knowledge of precautions;Pain       PT Treatment Interventions DME instruction;Functional mobility training;Therapeutic exercise;Gait training;Therapeutic activities;Balance training;Neuromuscular re-education;Patient/family education    PT Goals (Current goals can be found in the Care Plan section)  Acute Rehab PT Goals Patient Stated Goal: to get stronger PT Goal Formulation: With patient Time For Goal Achievement: 11/22/19 Potential to Achieve Goals: Good    Frequency Min 2X/week   Barriers to discharge        Co-evaluation               AM-PAC PT "6 Clicks" Mobility  Outcome Measure Help needed turning from your back to your side while in a flat bed without using bedrails?: A Lot Help needed moving from lying on your back to sitting on the side of a flat bed without using bedrails?: A Lot Help needed moving to and from a bed to a chair (including a wheelchair)?: A Lot Help needed standing up from a chair using your arms (e.g., wheelchair or bedside chair)?: A Lot Help needed to walk in hospital room?: A Lot Help needed climbing 3-5 steps with a railing? : Total 6 Click Score: 11    End of Session Equipment Utilized  During Treatment: Gait belt Activity Tolerance: Patient limited by pain;Patient limited by fatigue Patient left: in bed;with call bell/phone within reach Nurse Communication: Mobility status PT Visit Diagnosis: Other abnormalities of gait and mobility (R26.89);Muscle weakness (generalized) (M62.81)    Time: [id number] PT Time Calculation (min) (ACUTE ONLY): 39 min   Charges:   PT Evaluation $PT Eval Moderate Complexity: 1 Mod PT Treatments $Therapeutic Activity: 23-37 mins        Anastasio Champion, DPT  Acute Rehabilitation Services Pager [phone] Office [place] 11/08/2019, 4:14 PM

## 2019-11-08 NOTE — Care Management Obs Status (Signed)
[facility] NOTIFICATION   Patient Details  Name: Melanie Mcdonald MRN: [id number] Date of Birth: 1933/04/28   Medicare Observation Status Notification Given:  Yes    Claudie Leach, RN 11/08/2019, 5:19 PM

## 2019-11-09 ENCOUNTER — Encounter (HOSPITAL_COMMUNITY): Payer: Self-pay | Admitting: Internal Medicine

## 2019-11-09 DIAGNOSIS — G2581 Restless legs syndrome: Secondary | ICD-10-CM | POA: Diagnosis present

## 2019-11-09 DIAGNOSIS — E1165 Type 2 diabetes mellitus with hyperglycemia: Secondary | ICD-10-CM | POA: Diagnosis present

## 2019-11-09 DIAGNOSIS — Z832 Family history of diseases of the blood and blood-forming organs and certain disorders involving the immune mechanism: Secondary | ICD-10-CM | POA: Diagnosis not present

## 2019-11-09 DIAGNOSIS — Z20822 Contact with and (suspected) exposure to covid-19: Secondary | ICD-10-CM | POA: Diagnosis present

## 2019-11-09 DIAGNOSIS — J441 Chronic obstructive pulmonary disease with (acute) exacerbation: Secondary | ICD-10-CM | POA: Diagnosis present

## 2019-11-09 DIAGNOSIS — Z6836 Body mass index (BMI) 36.0-36.9, adult: Secondary | ICD-10-CM | POA: Diagnosis not present

## 2019-11-09 DIAGNOSIS — I2510 Atherosclerotic heart disease of native coronary artery without angina pectoris: Secondary | ICD-10-CM | POA: Diagnosis present

## 2019-11-09 DIAGNOSIS — M48061 Spinal stenosis, lumbar region without neurogenic claudication: Secondary | ICD-10-CM | POA: Diagnosis present

## 2019-11-09 DIAGNOSIS — G4733 Obstructive sleep apnea (adult) (pediatric): Secondary | ICD-10-CM | POA: Diagnosis present

## 2019-11-09 DIAGNOSIS — Z794 Long term (current) use of insulin: Secondary | ICD-10-CM | POA: Diagnosis not present

## 2019-11-09 DIAGNOSIS — R0902 Hypoxemia: Secondary | ICD-10-CM | POA: Diagnosis present

## 2019-11-09 DIAGNOSIS — G8929 Other chronic pain: Secondary | ICD-10-CM | POA: Diagnosis present

## 2019-11-09 DIAGNOSIS — Z825 Family history of asthma and other chronic lower respiratory diseases: Secondary | ICD-10-CM | POA: Diagnosis not present

## 2019-11-09 DIAGNOSIS — Z8673 Personal history of transient ischemic attack (TIA), and cerebral infarction without residual deficits: Secondary | ICD-10-CM | POA: Diagnosis not present

## 2019-11-09 DIAGNOSIS — R296 Repeated falls: Secondary | ICD-10-CM | POA: Diagnosis present

## 2019-11-09 DIAGNOSIS — I110 Hypertensive heart disease with heart failure: Secondary | ICD-10-CM | POA: Diagnosis present

## 2019-11-09 DIAGNOSIS — E860 Dehydration: Secondary | ICD-10-CM | POA: Diagnosis present

## 2019-11-09 DIAGNOSIS — I5022 Chronic systolic (congestive) heart failure: Secondary | ICD-10-CM | POA: Diagnosis present

## 2019-11-09 DIAGNOSIS — Z7902 Long term (current) use of antithrombotics/antiplatelets: Secondary | ICD-10-CM | POA: Diagnosis not present

## 2019-11-09 DIAGNOSIS — M1990 Unspecified osteoarthritis, unspecified site: Secondary | ICD-10-CM | POA: Diagnosis present

## 2019-11-09 LAB — GLUCOSE, CAPILLARY
Glucose-Capillary: 205 mg/dL — ABNORMAL HIGH (ref 70–99)
Glucose-Capillary: 190 mg/dL — ABNORMAL HIGH (ref 70–99)
Glucose-Capillary: 230 mg/dL — ABNORMAL HIGH (ref 70–99)
Glucose-Capillary: 255 mg/dL — ABNORMAL HIGH (ref 70–99)

## 2019-11-09 NOTE — Plan of Care (Signed)
  Problem: Pain Managment: Goal: General experience of comfort will improve Outcome: Progressing   Problem: Safety: Goal: Ability to remain free from injury will improve Outcome: Progressing   Problem: Skin Integrity: Goal: Risk for impaired skin integrity will decrease Outcome: Progressing   

## 2019-11-09 NOTE — NC FL2 (Signed)
[place] LEVEL OF CARE SCREENING TOOL     IDENTIFICATION  Patient Name: Melanie Mcdonald Birthdate: [birth year] Sex: female Admission Date (Current Location): 11/07/2019  [facility] and [place] Number:  Herbalist and Address:  [facility], [place] [address] [place], [place] [address]      Provider Number: [id number]  Attending Physician Name and Address:  Benito Mccreedy, MD  Relative Name and Phone Number:  Pepper, [place] [phone]    Current Level of Care: Hospital Recommended Level of Care: [place] Prior Approval Number:    Date Approved/Denied: 07/24/17 PASRR Number: [id number]  Discharge Plan: SNF    Current Diagnoses: Patient Active Problem List   Diagnosis Date Noted  . Recurrent falls 11/07/2019  . COPD with acute exacerbation ([place]) 11/07/2019  . Lymphedema of lower extremity 08/15/2019  . Degeneration of lumbar intervertebral disc 05/09/2019  . Spinal stenosis of lumbar region 05/09/2019  . Morbid obesity ([place]) 04/11/2019  . COPD with emphysema ([place]) 04/11/2019  . Trigger little finger of left hand 11/20/2018  . Osteoarthritis 03/15/2018  . CHF (congestive heart failure), NYHA class II, chronic, systolic ([place]) [id number]  . CVA (cerebral vascular accident) ([place]) 07/20/2017  . Candidiasis of genitalia in female 07/20/2017  . Uncontrolled type 2 diabetes mellitus with hyperglycemia, with long-term current use of insulin ([place]) 10/09/2016  . Gout 06/07/2016  . Leg swelling 04/05/2016  . Pain in joint, ankle and foot 12/24/2015  . Diabetic polyneuropathy associated with diabetes mellitus due to underlying condition ([place]) 06/14/2015  . Cervicogenic headache 06/14/2015  . Weakness generalized 11/13/2014  . TIA (transient ischemic attack) 11/13/2014  . Gait instability 11/13/2014  . Acute on chronic diastolic CHF (congestive heart failure), NYHA class 3 ([place]) 11/03/2014  . Obstructive apnea  11/03/2014  . DOE (dyspnea on exertion) 11/03/2014  . Thyroid nodule 04/28/2014  . Personal history of colonic polyps 03/09/2014  . Dizziness 02/10/2014  . Memory loss 12/30/2013  . Headache 12/30/2013  . Hypokalemia 08/15/2011  . Diarrhea 08/15/2011  . Chest pain 06/08/2011  . HIP PAIN 09/15/2010  . CYSTOCELE WITHOUT MENTION UTERINE PROLAPSE LAT 09/12/2010  . Labyrinthitis 11/03/2009  . NEPHROLITHIASIS 04/29/2009  . Peripheral vascular disease ([place]) 01/26/2009  . IRRITABLE BOWEL SYNDROME 01/26/2009  . Edema 09/02/2008  . Hyperlipidemia 08/26/2008  . Narcolepsy without cataplexy 08/26/2008  . SYNCOPE 08/26/2008  . Backache 06/23/2008  . Insomnia 12/11/2007  . Esophageal reflux 11/06/2007  . Depression, recurrent ([place]) 03/01/2007  . SYNDROME, RESTLESS LEGS 03/01/2007  . Essential hypertension 03/01/2007  . Coronary atherosclerosis 03/01/2007    Orientation RESPIRATION BLADDER Height & Weight     Self, Time, Situation, Place  O2(2lpm) Incontinent Weight: 113.4 kg Height:  5\' 9"  (175.3 cm)  BEHAVIORAL SYMPTOMS/MOOD NEUROLOGICAL BOWEL NUTRITION STATUS      Continent Diet  AMBULATORY STATUS COMMUNICATION OF NEEDS Skin   Extensive Assist Verbally Normal                       Personal Care Assistance Level of Assistance  Bathing, Dressing, Feeding, Total care Bathing Assistance: Maximum assistance Feeding assistance: Independent Dressing Assistance: Maximum assistance Total Care Assistance: Maximum assistance   Functional Limitations Info  Sight, Hearing, Speech Sight Info: Adequate Hearing Info: Adequate Speech Info: Adequate    SPECIAL CARE FACTORS FREQUENCY  PT (By licensed PT), OT (By licensed OT)     PT Frequency: 5xs/week OT Frequency: 5xs/week  Contractures Contractures Info: Not present    Additional Factors Info  Allergies, Insulin Sliding Scale, Code Status Code Status Info: Full Code Allergies Info: Lyrica, Aspirin,  Ciprofloxacin, Clarithromycin, Doxycycline, Erythromycin, Lisinopril, Macrobid, Metolazone, Other, Penicillins, Sulfonamide Derivatives, Valtrex   Insulin Sliding Scale Info: see discharge summary       Current Medications (11/09/2019):  This is the current hospital active medication list Current Facility-Administered Medications  Medication Dose Route Frequency Provider Last Rate Last Admin  . 0.9 %  sodium chloride infusion   Intravenous Continuous [place] Shi, MD 50 mL/hr at 11/08/19 2019 New Bag at 11/08/19 2019  . acetaminophen (TYLENOL) tablet 650 mg  650 mg Oral Q6H PRN [place] Shi, MD       Or  . acetaminophen (TYLENOL) suppository 650 mg  650 mg Rectal Q6H PRN [place] Shi, MD      . clopidogrel (PLAVIX) tablet 75 mg  75 mg Oral Daily Gu[place] Shi, MD   75 mg at 11/08/19 0904  . enoxaparin (LOVENOX) injection 40 mg  40 mg Subcutaneous Q24H [place] Shi, MD   40 mg at 11/08/19 2019  . furosemide (LASIX) tablet 60 mg  60 mg Oral BID Gu[place] Shi, MD   60 mg at 11/08/19 1704  . insulin aspart (novoLOG) injection 0-15 Units  0-15 Units Subcutaneous TID WC Gu[place] Shi, MD   8 Units at 11/08/19 1701  . insulin aspart (novoLOG) injection 10 Units  10 Units Subcutaneous TID WC Guil[place] Shi, MD   10 Units at 11/08/19 1702  . insulin detemir (LEVEMIR) injection 50 Units  50 Units Subcutaneous Daily Guilford Shi, MD   50 Units at 11/08/19 0907  . ipratropium-albuterol (DUONEB) 0.5-2.5 (3) MG/3ML nebulizer solution 3 mL  3 mL Nebulization Q4H PRN Kamineni, Neelima, MD      . lidocaine (LIDODERM) 5 % 1 patch  1 patch Transdermal Q24H [place] Shi, MD   1 patch at 11/08/19 0915  . nitroGLYCERIN (NITROSTAT) SL tablet 0.4 mg  0.4 mg Sublingual Q5 min PRN Gu[place] Shi, MD      . ondansetron (ZOFRAN) tablet 4 mg  4 mg Oral Q6H PRN Gu[place] Shi, MD       Or  . ondansetron (ZOFRAN) injection 4 mg  4 mg Intravenous Q6H PRN Guilford Shi, MD       . oxyCODONE-acetaminophen (PERCOCET/ROXICET) 5-325 MG per tablet 1 tablet  1 tablet Oral Q4H PRN Gu[place] Shi, MD   1 tablet at 11/09/19 0324  . phenazopyridine (PYRIDIUM) tablet 200 mg  200 mg Oral TID PRN Guilford Shi, MD      . polyethylene glycol (MIRALAX / GLYCOLAX) packet 17 g  17 g Oral Daily PRN Guilford Shi, MD      . pramipexole (MIRAPEX) tablet 4 mg  4 mg Oral QHS Guilford Shi, MD   4 mg at 11/08/19 2157  . predniSONE (DELTASONE) tablet 50 mg  50 mg Oral Q breakfast Guilford Shi, MD   50 mg at 11/08/19 0904  . spironolactone (ALDACTONE) tablet 25 mg  25 mg Oral BID Guilford Shi, MD   25 mg at 11/08/19 2157  . terbinafine (LAMISIL) tablet 250 mg  250 mg Oral Daily Guilford Shi, MD   250 mg at 11/08/19 [id number]  . zolpidem (AMBIEN) tablet 5 mg  5 mg Oral QHS PRN Gu[place] Shi, MD   5 mg at 11/08/19 2159     Discharge Medications: Please see discharge summary for a list of discharge medications.  Relevant Imaging Results:  Relevant Lab Results:   Additional Information Will require 30 days of skilled services.  Claudie Leach, RN

## 2019-11-09 NOTE — Progress Notes (Signed)
Text paged Dr Vista Lawman about [name]. Family has questions about infection and antibiotics. Awaiting response.

## 2019-11-09 NOTE — Progress Notes (Signed)
[facility]                                                                              Patient Demographics  Lennyx Winstanley, is a 86 y.o. female, DOB - [birth year], [id number]  Admit date - 11/07/2019   Admitting Physician Guilford Shi, MD  Outpatient Primary MD for the patient is Laurey Morale, MD  Outpatient specialists:   LOS - 0  days    Chief Complaint  Patient presents with  . Shortness of Breath       Brief summary  GLYN STUTZMAN is a 86 y.o. female with multiple medical history including but not limited to COPD, CAD, chronic diastolic CHF, and chronic back pain, on chronic opiates gabapentin was added psychiatric medications at home, presenting with recurrent falls associated with increased wheezing with shortness of breath from baseline.  Patient was hypoxic with O2 saturation of 80% on room air by EMS required nonrebreather mask.  At the ED chest x-ray noted mild pulmonary edema with CT scan negative for PE but noted bibasilar atelectasis versus infiltrates.  She was given a dose of IV steroid with improvement and patient admitted for recurrent falls associated with mild COPD exacerbation secondary to pneumonia  Assessment & Plan    Principal Problem:   Recurrent falls Active Problems:   SYNDROME, RESTLESS LEGS   SYNCOPE   Obstructive apnea   Leg swelling   Uncontrolled type 2 diabetes mellitus with hyperglycemia, with long-term current use of insulin (HCC)   Osteoarthritis   Morbid obesity (HCC)   Spinal stenosis of lumbar region   COPD with acute exacerbation (HCC)  Recurrent falls -Etiology-multifactorial including advanced DJD, acute mild COPD exacerbation with multiple sedative hypnotic including Ambien, gabapentin, Hydromet with opiates, as well as mild dehydration on admission. -Ambien/gabapentin/Hydromet head and frequency of Percocet reduced. -Dose of diuretic cut back admission -PT/OT to eval and treat as needed  Mild COPD  exacerbation  -Improved in the ED with IV steroids -Multiple allergies to respiratory antibiotics; patient afebrile without leukocytosis (no antibiotic for now) -Chest CT negative for obvious pneumonic infiltrate -Covid negative -Transitioned from IV steroids to ED to prednisone on admission -Nebulized bronchodilators with incentive spirometry  Chronic diastolic CHF Continue diuresis with monitor electrolytes and renal function with reduced dose  Insulin-dependent diabetes mellitus Continue insulin Monitor CBGs and adjust as needed      Code Status: Full code DVT Prophylaxis:  Lovenox  Family Communication: Discussed in detail with the patient, all imaging results, lab results explained to the   Disposition Plan: To be determined  Time Spent in minutes 25 minutes  Procedures:    Consultants:     Antimicrobials:      Medications  Scheduled Meds: . clopidogrel  75 mg Oral Daily  . enoxaparin (LOVENOX) injection  40 mg Subcutaneous Q24H  . furosemide  60 mg Oral BID  . insulin aspart  0-15 Units Subcutaneous TID WC  . insulin aspart  10 Units Subcutaneous TID WC  . insulin detemir  50 Units Subcutaneous Daily  . lidocaine  1 patch Transdermal Q24H  . pramipexole  4 mg Oral  QHS  . predniSONE  50 mg Oral Q breakfast  . spironolactone  25 mg Oral BID  . terbinafine  250 mg Oral Daily   Continuous Infusions: . sodium chloride 50 mL/hr at 11/08/19 2019   PRN Meds:.acetaminophen **OR** acetaminophen, ipratropium-albuterol, nitroGLYCERIN, ondansetron **OR** ondansetron (ZOFRAN) IV, oxyCODONE-acetaminophen, phenazopyridine, polyethylene glycol, zolpidem   Antibiotics   Anti-infectives (From admission, onward)   Start     Dose/Rate Route Frequency Ordered Stop   11/08/19 1000  terbinafine (LAMISIL) tablet 250 mg     250 mg Oral Daily 11/07/19 1913          Subjective:   Cici Fringer was seen and examined today.  No chest pain, shortness of breath  better Objective:   Vitals:   11/08/19 1226 11/08/19 1948 11/09/19 0314 11/09/19 0748  BP: (!) 122/59 (!) 131/56 116/60 (!) 125/51  Pulse: 75 79 70 69  Resp: 18   17  Temp: 98.6 F (37 C) 98.3 F (36.8 C) 98 F (36.7 C) 97.7 F (36.5 C)  TempSrc: Oral Oral Oral Oral  SpO2: 99% 99% 98% 100%  Weight:      Height:        Intake/Output Summary (Last 24 hours) at 11/09/2019 1156 Last data filed at 11/08/2019 1700 Gross per 24 hour  Intake 240 ml  Output 1200 ml  Net -960 ml     Wt Readings from Last 3 Encounters:  11/07/19 113.4 kg  05/13/19 107.5 kg  04/11/19 110 kg     Exam  General: NAD  HEENT: NCAT,  PERRL,MMM  Neck: SUPPLE, (-) JVD  Cardiovascular: RRR, (-) GALLOP, (-) MURMUR  Respiratory: Mildly decreased breath sounds at the bases with few occasional bilateral wheezing  Gastrointestinal: SOFT, (-) DISTENSION, BS(+), (_) TENDERNESS  Ext: (-) CYANOSIS, (-) EDEMA  Neuro: A, OX 3  Skin:(-) RASH  Psych:NORMAL AFFECT/MOOD   Data Reviewed:  I have personally reviewed following labs and imaging studies  Micro Results Recent Results (from the past 240 hour(s))  Respiratory Panel by RT PCR (Flu A&B, Covid) - Nasopharyngeal Swab     Status: None   Collection Time: 11/07/19  1:32 PM   Specimen: Nasopharyngeal Swab  Result Value Ref Range Status   SARS Coronavirus 2 by RT PCR NEGATIVE NEGATIVE Final    Comment: (NOTE) SARS-CoV-2 target nucleic acids are NOT DETECTED. The SARS-CoV-2 RNA is generally detectable in upper respiratoy specimens during the acute phase of infection. The lowest concentration of SARS-CoV-2 viral copies this assay can detect is 131 copies/mL. A negative result does not preclude SARS-Cov-2 infection and should not be used as the sole basis for treatment or other patient management decisions. A negative result may occur with  improper specimen collection/handling, submission of specimen other than nasopharyngeal swab, presence of viral  mutation(s) within the areas targeted by this assay, and inadequate number of viral copies (<131 copies/mL). A negative result must be combined with clinical observations, patient history, and epidemiological information. The expected result is Negative. Fact Sheet for Patients:  PinkCheek.be Fact Sheet for Healthcare Providers:  GravelBags.it This test is not yet ap proved or cleared by the [place] FDA and  has been authorized for detection and/or diagnosis of SARS-CoV-2 by FDA under an Emergency Use Authorization (EUA). This EUA will remain  in effect (meaning this test can be used) for the duration of the COVID-19 declaration under Section 564(b)(1) of the Act, 21 U.S.C. section 360bbb-3(b)(1), unless the authorization is terminated or revoked sooner.  Influenza A by PCR NEGATIVE NEGATIVE Final   Influenza B by PCR NEGATIVE NEGATIVE Final    Comment: (NOTE) The Xpert Xpress SARS-CoV-2/FLU/RSV assay is intended as an aid in  the diagnosis of influenza from Nasopharyngeal swab specimens and  should not be used as a sole basis for treatment. Nasal washings and  aspirates are unacceptable for Xpert Xpress SARS-CoV-2/FLU/RSV  testing. Fact Sheet for Patients: PinkCheek.be Fact Sheet for Healthcare Providers: GravelBags.it This test is not yet approved or cleared by the [place] FDA and  has been authorized for detection and/or diagnosis of SARS-CoV-2 by  FDA under an Emergency Use Authorization (EUA). This EUA will remain  in effect (meaning this test can be used) for the duration of the  Covid-19 declaration under Section 564(b)(1) of the Act, 21  U.S.C. section 360bbb-3(b)(1), unless the authorization is  terminated or revoked. Performed at [facility], [place] [address], [place], [place] [address]     Radiology Reports DG Knee 1-2 Views  Left  Result Date: 11/07/2019 CLINICAL DATA:  Pain EXAM: LEFT KNEE - 1-2 VIEW COMPARISON:  None. FINDINGS: There is no acute displaced fracture or dislocation. There are mild tricompartmental degenerative changes. There is nonspecific soft tissue swelling of the lower extremity. IMPRESSION: 1. No acute displaced fracture or dislocation. 2. Mild tricompartmental degenerative changes. Electronically Signed   By: Constance Holster M.D.   On: 11/07/2019 23:54   DG Knee 1-2 Views Right  Result Date: 11/07/2019 CLINICAL DATA:  Pain EXAM: RIGHT KNEE - 1-2 VIEW COMPARISON:  None. FINDINGS: There is no acute displaced fracture. No dislocation. There are mild tricompartmental degenerative changes. There is no significant joint effusion. There is nonspecific soft tissue swelling about the lower extremity. IMPRESSION: 1. No acute displaced fracture or dislocation. 2. Mild tricompartmental degenerative changes. Electronically Signed   By: Constance Holster M.D.   On: 11/07/2019 23:54   CT Head Wo Contrast  Result Date: 11/07/2019 CLINICAL DATA:  Syncope. EXAM: CT HEAD WITHOUT CONTRAST TECHNIQUE: Contiguous axial images were obtained from the base of the skull through the vertex without intravenous contrast. COMPARISON:  None. FINDINGS: Brain: There is mild cerebral atrophy with widening of the extra-axial spaces and ventricular dilatation. There are areas of decreased attenuation within the white matter tracts of the supratentorial brain, consistent with microvascular disease changes. Vascular: No hyperdense vessel or unexpected calcification. Skull: Normal. Negative for fracture or focal lesion. Sinuses/Orbits: No acute finding. Other: None. IMPRESSION: No acute intracranial pathology. Electronically Signed   By: Virgina Norfolk M.D.   On: 11/07/2019 20:34   CT Head Wo Contrast  Result Date: 11/05/2019 CLINICAL DATA:  Syncope 2.5 weeks ago, fell this morning EXAM: CT HEAD WITHOUT CONTRAST TECHNIQUE: Contiguous  axial images were obtained from the base of the skull through the vertex without intravenous contrast. COMPARISON:  11/21/2018 FINDINGS: Brain: No acute infarct or hemorrhage. Stable chronic small vessel ischemic changes within the periventricular white matter. Chronic small left cerebellar infarct. Lateral ventricles and midline structures are unremarkable. No acute extra-axial fluid collections. No mass effect. Vascular: No hyperdense vessel or unexpected calcification. Skull: Normal. Negative for fracture or focal lesion. Sinuses/Orbits: Mild polypoid mucosal thickening within the left frontal ethmoidal recess. Remaining sinuses are clear. Other: None IMPRESSION: 1. Stable chronic ischemic changes.  No acute intracranial process. Electronically Signed   By: Randa Ngo M.D.   On: 11/05/2019 09:00   CT Angio Chest PE W and/or Wo Contrast  Result Date: 11/07/2019 CLINICAL DATA:  Chest pain. EXAM: CT ANGIOGRAPHY CHEST WITH CONTRAST TECHNIQUE: Multidetector CT imaging of the chest was performed using the standard protocol during bolus administration of intravenous contrast. Multiplanar CT image reconstructions and MIPs were obtained to evaluate the vascular anatomy. CONTRAST:  178mL OMNIPAQUE IOHEXOL 350 MG/ML SOLN COMPARISON:  October 18, 2017 FINDINGS: Cardiovascular: There is mild calcification of the aortic arch. Satisfactory opacification of the pulmonary arteries to the segmental level. No evidence of pulmonary embolism. Normal heart size. No pericardial effusion. Mediastinum/Nodes: There is mild mediastinal lymphadenopathy. Lungs/Pleura: Mild areas of atelectasis and/or infiltrate are seen within the posterior aspect of the bilateral upper lobes with moderate severity or areas of bilateral posterior lower lobe atelectasis and/or infiltrate. There are small bilateral pleural effusions. No pneumothorax is identified. Upper Abdomen: There is a small hiatal hernia. Noninflamed diverticula are seen within the  splenic flexure. Musculoskeletal: Multilevel degenerative changes seen throughout the thoracic spine Review of the MIP images confirms the above findings. IMPRESSION: 1. No evidence of pulmonary embolism. 2. Mild areas of atelectasis and/or infiltrate are seen within the posterior aspect of the bilateral upper lobes with moderate severity bilateral posterior lower lobe atelectasis and/or infiltrate. 3. Small bilateral pleural effusions. Aortic Atherosclerosis (ICD10-I70.0). Electronically Signed   By: Virgina Norfolk M.D.   On: 11/07/2019 17:26   DG Chest Port 1 View  Result Date: 11/07/2019 CLINICAL DATA:  Shortness of breath EXAM: PORTABLE CHEST 1 VIEW COMPARISON:  03/12/2019 FINDINGS: The heart size and mediastinal contours are stable. Loop recorder device noted. Mild pulmonary vascular congestion. No focal consolidation, pleural effusion, or pneumothorax. The visualized skeletal structures are unremarkable. IMPRESSION: Mild cardiomegaly and pulmonary vascular congestion without overt pulmonary edema. Electronically Signed   By: Davina Poke D.O.   On: 11/07/2019 13:59   DG Knee Complete 4 Views Left  Result Date: 11/05/2019 CLINICAL DATA:  [place] this morning, knee pain EXAM: LEFT KNEE - COMPLETE 4+ VIEW COMPARISON:  None. FINDINGS: Frontal, bilateral oblique, lateral views of the left knee demonstrate 3 compartment osteoarthritis, most significant in the medial compartment with joint space narrowing and osteophyte formation. No fracture, subluxation, or dislocation. No joint effusion. IMPRESSION: 1. Prominent medial compartmental osteoarthritis. Less significant changes in the patellofemoral and lateral compartments. 2. No acute fracture. Electronically Signed   By: Randa Ngo M.D.   On: 11/05/2019 08:48   DG Knee Complete 4 Views Right  Result Date: 11/05/2019 CLINICAL DATA:  [place], bilateral knee pain EXAM: RIGHT KNEE - COMPLETE 4+ VIEW COMPARISON:  None. FINDINGS: Frontal, bilateral oblique,  lateral views of the right knee are obtained. Moderate 3 compartmental osteoarthritis greatest in the medial compartment. Prominent joint space narrowing and osteophyte formation. No acute fracture, subluxation, or dislocation. No joint effusion. IMPRESSION: 1. Moderate 3 compartment osteoarthritis, greatest medially. 2. No acute fracture. Electronically Signed   By: Randa Ngo M.D.   On: 11/05/2019 08:47   CUP PACEART REMOTE DEVICE CHECK  Result Date: 10/16/2019 [facility] summary report received. Battery status OK. Normal device function. No new symptom episodes, tachy episodes, brady, or pause episodes. No new AF episodes. Monthly summary reports and ROV/PRN Kathy Breach, RN, CCDS, [employer]  DG Hips Bilat W or Wo Pelvis 2 Views  Result Date: 11/07/2019 CLINICAL DATA:  Pain status post fall EXAM: DG HIP (WITH OR WITHOUT PELVIS) 2V BILAT COMPARISON:  None. FINDINGS: There is no evidence of hip fracture or dislocation. There is no evidence of arthropathy or other focal bone abnormality. IMPRESSION: Negative. Electronically Signed  By: Constance Holster M.D.   On: 11/07/2019 23:53    Lab Data:  CBC: Recent Labs  Lab 11/07/19 1344 11/07/19 1357 11/08/19 0616  WBC 10.5  --  6.9  NEUTROABS 7.6  --   --   HGB 13.0 13.3 12.6  HCT 41.3 39.0 39.3  MCV 94.1  --  91.6  PLT 255  --  [id number]   Basic Metabolic Panel: Recent Labs  Lab 11/07/19 1344 11/07/19 1357 11/08/19 0616  NA 142 142 139  K 4.5 4.4 4.3  CL 107  --  106  CO2 28  --  25  GLUCOSE 166*  --  300*  BUN 17  --  19  CREATININE 0.88  --  0.86  CALCIUM 8.9  --  8.5*   GFR: Estimated Creatinine Clearance: 65.4 mL/min (by C-G formula based on SCr of 0.86 mg/dL). Liver Function Tests: Recent Labs  Lab 11/07/19 1344  AST 20  ALT 18  ALKPHOS 74  BILITOT 0.4  PROT 6.9  ALBUMIN 3.4*   No results for input(s): LIPASE, AMYLASE in the last 168 hours. No results for input(s): AMMONIA in the last 168  hours. Coagulation Profile: No results for input(s): INR, PROTIME in the last 168 hours. Cardiac Enzymes: No results for input(s): CKTOTAL, CKMB, CKMBINDEX, TROPONINI in the last 168 hours. BNP (last 3 results) No results for input(s): PROBNP in the last 8760 hours. HbA1C: Recent Labs    11/08/19 0616  HGBA1C 8.7*   CBG: Recent Labs  Lab 11/08/19 0638 11/08/19 1127 11/08/19 1626 11/08/19 2128 11/09/19 0631  GLUCAP 249* 344* 260* 272* 205*   Lipid Profile: Recent Labs    11/08/19 0616  CHOL 139  HDL 60  LDLCALC 74  TRIG 25  CHOLHDL 2.3   Thyroid Function Tests: No results for input(s): TSH, T4TOTAL, FREET4, T3FREE, THYROIDAB in the last 72 hours. Anemia Panel: No results for input(s): VITAMINB12, FOLATE, FERRITIN, TIBC, IRON, RETICCTPCT in the last 72 hours. Urine analysis:    Component Value Date/Time   COLORURINE YELLOW 07/19/2017 1949   APPEARANCEUR CLEAR 07/19/2017 1949   LABSPEC 1.030 07/19/2017 1949   PHURINE 6.0 07/19/2017 1949   GLUCOSEU >=500 (A) 07/19/2017 1949   HGBUR NEGATIVE 07/19/2017 1949   HGBUR trace-lysed 09/12/2010 1538   BILIRUBINUR - 09/05/2019 [place] 07/19/2017 1949   PROTEINUR Negative 09/05/2019 1607   PROTEINUR NEGATIVE 07/19/2017 1949   UROBILINOGEN 0.2 09/05/2019 1607   UROBILINOGEN 0.2 11/10/2014 1225   NITRITE - 09/05/2019 1607   NITRITE NEGATIVE 07/19/2017 1949   LEUKOCYTESUR Small (1+) (A) 09/05/2019 1607     Benito Mccreedy M.D. [facility] 11/09/2019, 11:56 AM  Pager: [phone] Between 7am to 7pm - call Pager - [phone]  After 7pm go to [email or web address] - password [email or web address]  Call night coverage person covering after 7pm

## 2019-11-10 ENCOUNTER — Other Ambulatory Visit: Payer: Self-pay

## 2019-11-10 LAB — GLUCOSE, CAPILLARY
Glucose-Capillary: 133 mg/dL — ABNORMAL HIGH (ref 70–99)
Glucose-Capillary: 151 mg/dL — ABNORMAL HIGH (ref 70–99)
Glucose-Capillary: 232 mg/dL — ABNORMAL HIGH (ref 70–99)
Glucose-Capillary: 306 mg/dL — ABNORMAL HIGH (ref 70–99)

## 2019-11-10 MED ORDER — FAMOTIDINE 20 MG PO TABS
20.0000 mg | ORAL_TABLET | Freq: Once | ORAL | Status: AC
  Administered 2019-11-10: 20 mg via ORAL
  Filled 2019-11-10: qty 1

## 2019-11-10 MED ORDER — INSULIN ASPART 100 UNIT/ML ~~LOC~~ SOLN
0.0000 [IU] | Freq: Three times a day (TID) | SUBCUTANEOUS | Status: DC
  Administered 2019-11-11: 2 [IU] via SUBCUTANEOUS

## 2019-11-10 MED ORDER — FREESTYLE LIBRE 14 DAY READER DEVI: 1.0000 | Freq: Every day | 0 refills | Status: DC

## 2019-11-10 MED ORDER — INSULIN ASPART 100 UNIT/ML ~~LOC~~ SOLN: 0.0000 [IU] | Freq: Every day | SUBCUTANEOUS | Status: DC

## 2019-11-10 NOTE — Progress Notes (Signed)
Notified [place] on call for [facility] hospitalist that patient  is c/o Rt anterior chest discomfort/pain, rating the pain a 4/10. VS stable,trending the same as previous VS check. Patient was lying flat when c/o pain. Raised the head of bed and reapplied oxygen at 2L/min (pt had taken oxygen off). Patient is requesting an antacid in pill form- does not want liquid po antacid. Patient has now fallen asleep. RN will continue to monitor patient.

## 2019-11-10 NOTE — Progress Notes (Addendum)
PROGRESS NOTE    Melanie Mcdonald  [id number] DOB: 17-Oct-1932 DOA: 11/07/2019 PCP: Laurey Morale, MD   Brief Narrative: Patient is 47 female with history of COPD, coronary artery disease, chronic diastolic CHF, chronic back pain who presented with recurrent falls, wheezing, shortness of breath.  She was admitted for the management of COPD exacerbation and started on steroids and bronchodilators.  Respiratory status is currently stable.  PT/OT recommended skilled nursing facility.  She is hemodynamically stable for discharge as per nursing facility soon as bed is available.  Assessment & Plan:   Principal Problem:   Recurrent falls Active Problems:   SYNDROME, RESTLESS LEGS   SYNCOPE   Obstructive apnea   Leg swelling   Uncontrolled type 2 diabetes mellitus with hyperglycemia, with long-term current use of insulin (HCC)   Osteoarthritis   Morbid obesity (HCC)   Spinal stenosis of lumbar region   COPD with acute exacerbation (HCC)   Recurrent falls: Multifactorial.  She has history of osteoarthritis, she is morbidly obese.  She is also on multiple sedating meds medications including Ambien, gabapentin, Hydromet, Percocet. She was seen by PT and OT had recommended skilled nursing facility.  She lives with her son who cannot take care of her 24/7 but she wants to go home.  Waiting to talk to case manager  COPD exacerbation: Presented with wheezing, shortness of breath.  She has never a smoker.  Respiratory status is significantly improved.  Started on steroids, bronchodilators.  Chest imaging did not show pneumonia.  Chronic diastolic CHF: Takes Lasix 80 mg twice a day at home.  Continue current Lasix regimen.  Currently euvolemic  Insulin-dependent diabetes mellitus type 2: Continue current insulin regimen.  Monitor CBGs.  Hemoglobin A1c of 8.7.         DVT prophylaxis:Lovenox Code Status: Full code Family Communication: Called son,phone not received Disposition Plan: She is  from home.  PT/OT recommended skilled nursing facility on discharge.  She is waiting for bed availability.  Case manager following   Consultants: None  Procedures: None  Antimicrobials:  Anti-infectives (From admission, onward)   Start     Dose/Rate Route Frequency Ordered Stop   11/08/19 1000  terbinafine (LAMISIL) tablet 250 mg     250 mg Oral Daily 11/07/19 1913        Subjective: Patient seen and examined at the bedside this afternoon.  Currently hemodynamically stable.  Comfortable.  Respiratory status stable.  Denies any shortness of breath.  No wheezes on auscultation.  She is eager to go home but waiting to hear about SNF details from the case manager.  Objective: Vitals:   11/10/19 0757 11/10/19 1030 11/10/19 1035 11/10/19 1437  BP: (!) 125/55   (!) 146/76  Pulse: 64   71  Resp: 17   16  Temp: 98.2 F (36.8 C)   98.1 F (36.7 C)  TempSrc: Oral   Oral  SpO2: 97% 98% 97% 95%  Weight:      Height:        Intake/Output Summary (Last 24 hours) at 11/10/2019 1442 Last data filed at 11/10/2019 1300 Gross per 24 hour  Intake 720 ml  Output 200 ml  Net 520 ml   Filed Weights   11/07/19 1249  Weight: 113.4 kg    Examination:  General exam: Appears calm and comfortable ,Not in distress, morbidly overweight, elderly pleasant female HEENT:PERRL,Oral mucosa moist, Ear/Nose normal on gross exam Respiratory system: Bilateral equal air entry, normal vesicular breath sounds, no  wheezes or crackles  Cardiovascular system: S1 & S2 heard, RRR. No JVD, murmurs, rubs, gallops or clicks. No pedal edema. Gastrointestinal system: Abdomen is nondistended, soft and nontender. No organomegaly or masses felt. Normal bowel sounds heard. Central nervous system: Alert and oriented. No focal neurological deficits. Extremities: No edema, no clubbing ,no cyanosis Skin: No rashes, lesions or ulcers,no icterus ,no pallor    Data Reviewed: I have personally reviewed following labs and imaging  studies  CBC: Recent Labs  Lab 11/07/19 1344 11/07/19 1357 11/08/19 0616  WBC 10.5  --  6.9  NEUTROABS 7.6  --   --   HGB 13.0 13.3 12.6  HCT 41.3 39.0 39.3  MCV 94.1  --  91.6  PLT 255  --  [id number]   Basic Metabolic Panel: Recent Labs  Lab 11/07/19 1344 11/07/19 1357 11/08/19 0616  NA 142 142 139  K 4.5 4.4 4.3  CL 107  --  106  CO2 28  --  25  GLUCOSE 166*  --  300*  BUN 17  --  19  CREATININE 0.88  --  0.86  CALCIUM 8.9  --  8.5*   GFR: Estimated Creatinine Clearance: 65.4 mL/min (by C-G formula based on SCr of 0.86 mg/dL). Liver Function Tests: Recent Labs  Lab 11/07/19 1344  AST 20  ALT 18  ALKPHOS 74  BILITOT 0.4  PROT 6.9  ALBUMIN 3.4*   No results for input(s): LIPASE, AMYLASE in the last 168 hours. No results for input(s): AMMONIA in the last 168 hours. Coagulation Profile: No results for input(s): INR, PROTIME in the last 168 hours. Cardiac Enzymes: No results for input(s): CKTOTAL, CKMB, CKMBINDEX, TROPONINI in the last 168 hours. BNP (last 3 results) No results for input(s): PROBNP in the last 8760 hours. HbA1C: Recent Labs    11/08/19 0616  HGBA1C 8.7*   CBG: Recent Labs  Lab 11/09/19 1308 11/09/19 1616 11/09/19 2101 11/10/19 0653 11/10/19 1145  GLUCAP 190* 230* 255* 133* 151*   Lipid Profile: Recent Labs    11/08/19 0616  CHOL 139  HDL 60  LDLCALC 74  TRIG 25  CHOLHDL 2.3   Thyroid Function Tests: No results for input(s): TSH, T4TOTAL, FREET4, T3FREE, THYROIDAB in the last 72 hours. Anemia Panel: No results for input(s): VITAMINB12, FOLATE, FERRITIN, TIBC, IRON, RETICCTPCT in the last 72 hours. Sepsis Labs: Recent Labs  Lab 11/07/19 1340  LATICACIDVEN 1.0    Recent Results (from the past 240 hour(s))  Respiratory Panel by RT PCR (Flu A&B, Covid) - Nasopharyngeal Swab     Status: None   Collection Time: 11/07/19  1:32 PM   Specimen: Nasopharyngeal Swab  Result Value Ref Range Status   SARS Coronavirus 2 by RT PCR  NEGATIVE NEGATIVE Final    Comment: (NOTE) SARS-CoV-2 target nucleic acids are NOT DETECTED. The SARS-CoV-2 RNA is generally detectable in upper respiratoy specimens during the acute phase of infection. The lowest concentration of SARS-CoV-2 viral copies this assay can detect is 131 copies/mL. A negative result does not preclude SARS-Cov-2 infection and should not be used as the sole basis for treatment or other patient management decisions. A negative result may occur with  improper specimen collection/handling, submission of specimen other than nasopharyngeal swab, presence of viral mutation(s) within the areas targeted by this assay, and inadequate number of viral copies (<131 copies/mL). A negative result must be combined with clinical observations, patient history, and epidemiological information. The expected result is Negative. Fact Sheet for Patients:  PinkCheek.be Fact Sheet for Healthcare Providers:  GravelBags.it This test is not yet ap proved or cleared by the [place] FDA and  has been authorized for detection and/or diagnosis of SARS-CoV-2 by FDA under an Emergency Use Authorization (EUA). This EUA will remain  in effect (meaning this test can be used) for the duration of the COVID-19 declaration under Section 564(b)(1) of the Act, 21 U.S.C. section 360bbb-3(b)(1), unless the authorization is terminated or revoked sooner.    Influenza A by PCR NEGATIVE NEGATIVE Final   Influenza B by PCR NEGATIVE NEGATIVE Final    Comment: (NOTE) The Xpert Xpress SARS-CoV-2/FLU/RSV assay is intended as an aid in  the diagnosis of influenza from Nasopharyngeal swab specimens and  should not be used as a sole basis for treatment. Nasal washings and  aspirates are unacceptable for Xpert Xpress SARS-CoV-2/FLU/RSV  testing. Fact Sheet for Patients: PinkCheek.be Fact Sheet for Healthcare  Providers: GravelBags.it This test is not yet approved or cleared by the [place] FDA and  has been authorized for detection and/or diagnosis of SARS-CoV-2 by  FDA under an Emergency Use Authorization (EUA). This EUA will remain  in effect (meaning this test can be used) for the duration of the  Covid-19 declaration under Section 564(b)(1) of the Act, 21  U.S.C. section 360bbb-3(b)(1), unless the authorization is  terminated or revoked. Performed at [facility], [place] [address] [place], [place] [address]          Radiology Studies: No results found.      Scheduled Meds: . clopidogrel  75 mg Oral Daily  . enoxaparin (LOVENOX) injection  40 mg Subcutaneous Q24H  . furosemide  60 mg Oral BID  . insulin aspart  0-15 Units Subcutaneous TID WC  . insulin aspart  10 Units Subcutaneous TID WC  . insulin detemir  50 Units Subcutaneous Daily  . lidocaine  1 patch Transdermal Q24H  . pramipexole  4 mg Oral QHS  . predniSONE  50 mg Oral Q breakfast  . spironolactone  25 mg Oral BID  . terbinafine  250 mg Oral Daily   Continuous Infusions:   LOS: 1 day    Time spent: 35 mins.More than 50% of that time was spent in counseling and/or coordination of care.      Shelly Coss, MD [facility] P2/05/2020, 2:42 PM

## 2019-11-10 NOTE — Plan of Care (Signed)
  Problem: Pain Managment: Goal: General experience of comfort will improve Outcome: Progressing   Problem: Safety: Goal: Ability to remain free from injury will improve Outcome: Progressing   Problem: Skin Integrity: Goal: Risk for impaired skin integrity will decrease Outcome: Progressing   

## 2019-11-10 NOTE — Progress Notes (Signed)
Notified Melanie Mcdonald, on call for [facility] hospitalist that pt's CBG is 306. Pt asymptomatic.  Will continue to monitor the patient's status.

## 2019-11-10 NOTE — Telephone Encounter (Signed)
Left message for patient to call back  

## 2019-11-11 ENCOUNTER — Telehealth: Payer: Self-pay | Admitting: Family Medicine

## 2019-11-11 LAB — GLUCOSE, CAPILLARY
Glucose-Capillary: 150 mg/dL — ABNORMAL HIGH (ref 70–99)
Glucose-Capillary: 159 mg/dL — ABNORMAL HIGH (ref 70–99)

## 2019-11-11 MED ORDER — ALBUTEROL SULFATE HFA 108 (90 BASE) MCG/ACT IN AERS: 2.0000 | INHALATION_SPRAY | Freq: Four times a day (QID) | RESPIRATORY_TRACT | 1 refills | Status: DC | PRN

## 2019-11-11 MED ORDER — FUROSEMIDE 40 MG PO TABS: 60.0000 mg | ORAL_TABLET | Freq: Two times a day (BID) | ORAL | 0 refills | Status: DC

## 2019-11-11 NOTE — Progress Notes (Signed)
Patient discharging home. Discharge instructions explained to patient and she verbalized understanding. Took all personal belongings. No further questions or concerns voiced.  

## 2019-11-11 NOTE — Telephone Encounter (Signed)
Unable to reach [facility]. Message will be closed.

## 2019-11-11 NOTE — Progress Notes (Signed)
Received call from [facility]. Unable to accept referral for [facility] d/t staffing capacity. Referral placed to [facility] accepted. Patient notified of change and agreeable. [facility] made aware.   Manya Silvas, RN MSN CCM Transitions of Care [phone]

## 2019-11-11 NOTE — Telephone Encounter (Signed)
Pt is requesting a Diabetic 14 days freestyle meter reader to be sent to [facility] on [address]). Thanks  Pt # [phone]

## 2019-11-11 NOTE — TOC Transition Note (Signed)
Transition of Care [facility]) - CM/SW Discharge Note   Patient Details  Name: Melanie Mcdonald MRN: [id number] Date of Birth: [birth year]  Transition of Care [facility]) CM/SW Contact:  Bartholomew Crews, RN Phone Number: [phone] 11/11/2019, 11:48 AM   Clinical Narrative:    Spoke with patient on her room phone. States that she is home with her son, and that her son is picking her up for discharge today. States that she is active with [facility] for [facility]. HH orders placed by MD for RN, PT, OT with [facility]. Notified Cassie at [facility] of patient DC today. Patient also reports having 2 RWs and an electric chair. She says she will call Dr. Sarajane Jews her PCP for an appointment. She will also pick up discharge prescriptions at the [facility].  No further TOC needs identified at this time.    Final next level of care: [place] Barriers to Discharge: No Barriers Identified   Patient Goals and CMS Choice Patient states their goals for this hospitalization and ongoing recovery are:: go home CMS Medicare.gov Compare Post Acute Care list provided to:: Patient Choice offered to / list presented to : Patient  Discharge Placement                       Discharge Plan and Services                DME Arranged: N/A DME Agency: NA       HH Arranged: RN, PT, OT HH Agency: [facility] Date [facility] Agency Contacted: 11/11/19 Time [place]: 80 Representative spoke with at [place]: Cassie  Social Determinants of Health ([place]) Interventions     Readmission Risk Interventions No flowsheet data found.

## 2019-11-11 NOTE — Progress Notes (Signed)
Physical Therapy Treatment Patient Details Name: Melanie Mcdonald MRN: [id number] DOB: Oct 25, 1932 Today's Date: 11/11/2019    History of Present Illness Pt is an 86 y/o female admitted secondary to recurrent falls at home. All imaging was negative for any acute findings. PMH including but not limited to CAD, CHF, COPD, CVA, DM, and HTN.    PT Comments    Pt reporting she is adamant for return home and that between family and caregivers she will have nearly 24hr help at home. Pt able to get OOB and walk limited distance this session without significant physical assist and SPo2 98% on RA. Pt educated for progressive activity, use of bSC at home and increased HEP to increase function and decrease fall risk. Pt reports level entry into home and stair lift to bedroom.     Follow Up Recommendations  Home health PT;Supervision for mobility/OOB     Equipment Recommendations  None recommended by PT    Recommendations for Other Services       Precautions / Restrictions Precautions Precautions: Fall Precaution Comments: incontinent Restrictions Weight Bearing Restrictions: No    Mobility  Bed Mobility Overal bed mobility: Needs Assistance Bed Mobility: Supine to Sit     Supine to sit: Min guard;HOB elevated     General bed mobility comments: HOB 30 degrees with use of rail and increased time pt able to pivot toward right side of bed  Transfers Overall transfer level: Needs assistance   Transfers: Sit to/from Stand Sit to Stand: Min guard         General transfer comment: cues for hand placement with pt able to rise from bed without physical assist  Ambulation/Gait Ambulation/Gait assistance: Min guard Gait Distance (Feet): 20 Feet Assistive device: Rolling walker (2 wheeled) Gait Pattern/deviations: Step-through pattern;Decreased stride length;Trunk flexed   Gait velocity interpretation: 1.31 - 2.62 ft/sec, indicative of limited community ambulator General Gait Details:  cues for posture with pt able to walk short distance in room with use of RW   Stairs             Wheelchair Mobility    Modified Rankin (Stroke Patients Only)       Balance Overall balance assessment: Needs assistance   Sitting balance-Leahy Scale: Fair Sitting balance - Comments: pt able to sit EOB without assist   Standing balance support: Bilateral upper extremity supported Standing balance-Leahy Scale: Poor                              Cognition Arousal/Alertness: Awake/alert Behavior During Therapy: WFL for tasks assessed/performed Overall Cognitive Status: Within Functional Limits for tasks assessed                                        Exercises General Exercises - Lower Extremity Long Arc Quad: AROM;Both;Seated;20 reps Hip ABduction/ADduction: AROM;Left;Seated;20 reps;Right;10 reps(10 reps on Rt with limited ROM due to pain, 20 reps on left) Hip Flexion/Marching: AROM;Both;Seated;20 reps    General Comments        Pertinent Vitals/Pain Faces Pain Scale: Hurts little more Pain Location: RT hip with HEP Pain Descriptors / Indicators: Aching;Guarding Pain Intervention(s): Limited activity within patient's tolerance;Monitored during session;Repositioned    Home Living         Home Access: Ramped entrance     Home Equipment: Walker - 4 wheels;Bedside commode;Shower seat;Other (  comment)(adjustable bed)      Prior Function Level of Independence: Needs assistance  Gait / Transfers Assistance Needed: uses RW and can walk to bathroom   Comments: has chair lift to get to main floor   PT Goals (current goals can now be found in the care plan section) Progress towards PT goals: Progressing toward goals    Frequency    Min 3X/week      PT Plan Discharge plan needs to be updated;Frequency needs to be updated    Co-evaluation              AM-PAC PT "6 Clicks" Mobility   Outcome Measure  Help needed turning  from your back to your side while in a flat bed without using bedrails?: A Little Help needed moving from lying on your back to sitting on the side of a flat bed without using bedrails?: A Little Help needed moving to and from a bed to a chair (including a wheelchair)?: A Little Help needed standing up from a chair using your arms (e.g., wheelchair or bedside chair)?: A Little Help needed to walk in hospital room?: A Little Help needed climbing 3-5 steps with a railing? : Total 6 Click Score: 16    End of Session Equipment Utilized During Treatment: Gait belt Activity Tolerance: Patient tolerated treatment well Patient left: in chair;with call bell/phone within reach;with chair alarm set Nurse Communication: Mobility status PT Visit Diagnosis: Other abnormalities of gait and mobility (R26.89);Muscle weakness (generalized) (M62.81)     Time: [id number] PT Time Calculation (min) (ACUTE ONLY): 20 min  Charges:  $Gait Training: 8-22 mins                     Bayard Males, PT Acute Rehabilitation Services Pager: [phone] Office: [place] 11/11/2019, 1:25 PM

## 2019-11-11 NOTE — Discharge Summary (Signed)
Physician Discharge Summary  Melanie Mcdonald [id number] DOB: 09-12-1933 DOA: 11/07/2019  PCP: Laurey Morale, MD  Admit date: 11/07/2019 Discharge date: 11/11/2019  Admitted From: Home Disposition:  Home  Discharge Condition:Stable CODE STATUS:FUL Diet recommendation: Heart Healthy   Brief/Interim Summary:  Patient is 76 female with history of COPD, coronary artery disease, chronic diastolic CHF, chronic back pain who presented with recurrent falls, wheezing, shortness of breath.  She also reported falling frequently at home.She was admitted for the management of COPD exacerbation and started on steroids and bronchodilators.  Respiratory status is currently stable. She finished the steroid course.  She is currently saturating fine on room air. PT/OT recommended home health.  She is hemodynamically stable for discharge today.  Following problems were addressed during hospitalization:  COPD exacerbation: Presented with wheezing, shortness of breath.  She has never a smoker.  Respiratory status has significantly improved.  Started on steroids, bronchodilators.  Chest imaging did not show pneumonia.  Prescribed albuterol inhaler on discharge.  Recurrent falls: Multifactorial.  She has history of osteoarthritis, she is morbidly obese.  She is also on multiple sedating meds medications including Ambien, gabapentin, Hydromet, Percocet. She was seen by PT and OT had recommended homehealth.  She lives with her son who cannot take care of her 24/7 but she wants to go home.    Chronic diastolic CHF: Takes Lasix 80 mg twice a day at home.    Also takes spironolactone.  Follows with cardiology.  And euvolemic.  We recommend to take Lasix to 60 mg twice a day at home.  Insulin-dependent diabetes mellitus type 2: Continue home  insulin regimen.  Hemoglobin A1c of 8.7.     Discharge Diagnoses:  Principal Problem:   Recurrent falls Active Problems:   SYNDROME, RESTLESS LEGS   SYNCOPE   Obstructive  apnea   Leg swelling   Uncontrolled type 2 diabetes mellitus with hyperglycemia, with long-term current use of insulin (HCC)   Osteoarthritis   Morbid obesity ([place])   Spinal stenosis of lumbar region   COPD with acute exacerbation [facility])    Discharge Instructions  Discharge Instructions    Diet - low sodium heart healthy   Complete by: As directed    Discharge instructions   Complete by: As directed    1)Please follow up with your PCP in a week. 2)Take prescribed medication as instructed. 3)Follow up with home health.   Increase activity slowly   Complete by: As directed      Allergies as of 11/11/2019      Reactions   Lyrica [pregabalin] Other (See Comments)   Suicidal    Aspirin Other (See Comments)   Abdominal pain   Ciprofloxacin Nausea And Vomiting   Made pt very sick on stomach   Clarithromycin Other (See Comments)   Made stomach "burn"   Doxycycline Itching   Erythromycin Nausea And Vomiting   Lisinopril Cough   Macrobid [nitrofurantoin Monohyd Macro] Nausea And Vomiting   Metolazone Other (See Comments)   Pt stated this made her B/P drop   Other Nausea And Vomiting   Pt cannot have any of the -mycin(s)   Penicillins Swelling   From childhood: swelling @ injection site,childhood allergy Has patient had a PCN reaction causing immediate rash, facial/tongue/throat swelling, SOB or lightheadedness with hypotension: Yes Has patient had a PCN reaction causing severe rash involving mucus membranes or skin necrosis: No Has patient had a PCN reaction that required hospitalization: No Has patient had a PCN reaction  occurring within the last 10 years: No If all of the above answers are "NO", then may proceed with Cephalosporin use.   Sulfonamide Derivatives Nausea And Vomiting   Valtrex [valacyclovir Hcl] Nausea Only      Medication List    STOP taking these medications   clobetasol 0.05 % external solution Commonly known as: TEMOVATE   gabapentin 300 MG  capsule Commonly known as: NEURONTIN   promethazine 25 MG tablet Commonly known as: PHENERGAN   terbinafine 250 MG tablet Commonly known as: LAMISIL     TAKE these medications   albuterol 108 (90 Base) MCG/ACT inhaler Commonly known as: VENTOLIN HFA Inhale 2 puffs into the lungs every 6 (six) hours as needed for wheezing or shortness of breath.   clopidogrel 75 MG tablet Commonly known as: PLAVIX TAKE 1 TABLET BY MOUTH EVERY DAY   diclofenac Sodium 1 % Gel Commonly known as: VOLTAREN Apply 2 g topically 4 (four) times daily.   fluconazole 100 MG tablet Commonly known as: DIFLUCAN Take 100 mg by mouth daily.   FreeStyle Libre 14 Day Reader Kerrin Mo 1 Device by Does not apply route daily. Use device daily as directed   FreeStyle Libre 14 Day Sensor Misc 1 application by Does not apply route every 14 (fourteen) days.   furosemide 40 MG tablet Commonly known as: LASIX Take 1.5 tablets (60 mg total) by mouth 2 (two) times daily. TAKE 2 TABLETS BY MOUTH TWICE DAILY What changed:   how much to take  how to take this  when to take this   glucose blood test strip Commonly known as: ONE TOUCH ULTRA TEST 1 each by Other route as needed for other. Use as instructed   HumaLOG KwikPen 100 UNIT/ML KwikPen Generic drug: insulin lispro Inject 0.2 mLs (20 Units total) into the skin 3 (three) times daily before meals.   HYDROcodone-homatropine 5-1.5 MG/5ML syrup Commonly known as: Hydromet Take 5 mLs by mouth every 4 (four) hours as needed.   meclizine 25 MG tablet Commonly known as: ANTIVERT Take 1 tablet (25 mg total) by mouth 3 (three) times daily as needed for dizziness.   mupirocin ointment 2 % Commonly known as: BACTROBAN Apply 1 application topically 2 (two) times daily.   nitroGLYCERIN 0.4 MG SL tablet Commonly known as: NITROSTAT Place 0.4 mg under the tongue every 5 (five) minutes as needed for chest pain.   NovoFine 32G X 6 MM Misc Generic drug: Insulin Pen  Needle CHECK BLOOD GLUCOSE THREE TIMES DAILY What changed:   how much to take  how to take this  when to take this   oxyCODONE-acetaminophen 10-325 MG tablet Commonly known as: PERCOCET Take 1 tablet by mouth every 4 (four) hours as needed for up to 5 days for pain.   phenazopyridine 200 MG tablet Commonly known as: Pyridium Take 1 tablet (200 mg total) by mouth 3 (three) times daily as needed (urinary pain).   pramipexole 1 MG tablet Commonly known as: MIRAPEX TAKE 4 TABLETS BY MOUTH ONCE DAILY What changed:   how much to take  how to take this  when to take this  additional instructions   spironolactone 25 MG tablet Commonly known as: ALDACTONE TAKE 1 TABLET(25 MG) BY MOUTH TWICE DAILY What changed: See the new instructions.   Tyler Aas FlexTouch 100 UNIT/ML Sopn FlexTouch Pen Generic drug: insulin degludec INJECT 80 UNITS INTO THE SKIN EVERY MORNING What changed: See the new instructions.   zolpidem 10 MG tablet Commonly known as:  AMBIEN TAKE 1 TABLET(10 MG) BY MOUTH AT BEDTIME AS NEEDED FOR SLEEP What changed: See the new instructions.      Follow-up Information    Laurey Morale, MD. Schedule an appointment as soon as possible for a visit in 1 week(s).   Specialty: Family Medicine Contact information: [address] [place] [address] [phone]          Allergies  Allergen Reactions  . Lyrica [Pregabalin] Other (See Comments)    Suicidal   . Aspirin Other (See Comments)    Abdominal pain  . Ciprofloxacin Nausea And Vomiting    Made pt very sick on stomach  . Clarithromycin Other (See Comments)    Made stomach "burn"  . Doxycycline Itching  . Erythromycin Nausea And Vomiting  . Lisinopril Cough  . Macrobid [Nitrofurantoin Monohyd Macro] Nausea And Vomiting  . Metolazone Other (See Comments)    Pt stated this made her B/P drop  . Other Nausea And Vomiting    Pt cannot have any of the -mycin(s)  . Penicillins Swelling     From childhood: swelling @ injection site,childhood allergy Has patient had a PCN reaction causing immediate rash, facial/tongue/throat swelling, SOB or lightheadedness with hypotension: Yes Has patient had a PCN reaction causing severe rash involving mucus membranes or skin necrosis: No Has patient had a PCN reaction that required hospitalization: No Has patient had a PCN reaction occurring within the last 10 years: No If all of the above answers are "NO", then may proceed with Cephalosporin use.  . Sulfonamide Derivatives Nausea And Vomiting  . Valtrex [Valacyclovir Hcl] Nausea Only    Consultations:  None   Procedures/Studies: DG Knee 1-2 Views Left  Result Date: 11/07/2019 CLINICAL DATA:  Pain EXAM: LEFT KNEE - 1-2 VIEW COMPARISON:  None. FINDINGS: There is no acute displaced fracture or dislocation. There are mild tricompartmental degenerative changes. There is nonspecific soft tissue swelling of the lower extremity. IMPRESSION: 1. No acute displaced fracture or dislocation. 2. Mild tricompartmental degenerative changes. Electronically Signed   By: Constance Holster M.D.   On: 11/07/2019 23:54   DG Knee 1-2 Views Right  Result Date: 11/07/2019 CLINICAL DATA:  Pain EXAM: RIGHT KNEE - 1-2 VIEW COMPARISON:  None. FINDINGS: There is no acute displaced fracture. No dislocation. There are mild tricompartmental degenerative changes. There is no significant joint effusion. There is nonspecific soft tissue swelling about the lower extremity. IMPRESSION: 1. No acute displaced fracture or dislocation. 2. Mild tricompartmental degenerative changes. Electronically Signed   By: Constance Holster M.D.   On: 11/07/2019 23:54   CT Head Wo Contrast  Result Date: 11/07/2019 CLINICAL DATA:  Syncope. EXAM: CT HEAD WITHOUT CONTRAST TECHNIQUE: Contiguous axial images were obtained from the base of the skull through the vertex without intravenous contrast. COMPARISON:  None. FINDINGS: Brain: There is mild  cerebral atrophy with widening of the extra-axial spaces and ventricular dilatation. There are areas of decreased attenuation within the white matter tracts of the supratentorial brain, consistent with microvascular disease changes. Vascular: No hyperdense vessel or unexpected calcification. Skull: Normal. Negative for fracture or focal lesion. Sinuses/Orbits: No acute finding. Other: None. IMPRESSION: No acute intracranial pathology. Electronically Signed   By: Virgina Norfolk M.D.   On: 11/07/2019 20:34   CT Head Wo Contrast  Result Date: 11/05/2019 CLINICAL DATA:  Syncope 2.5 weeks ago, fell this morning EXAM: CT HEAD WITHOUT CONTRAST TECHNIQUE: Contiguous axial images were obtained from the base of the skull through the vertex  without intravenous contrast. COMPARISON:  11/21/2018 FINDINGS: Brain: No acute infarct or hemorrhage. Stable chronic small vessel ischemic changes within the periventricular white matter. Chronic small left cerebellar infarct. Lateral ventricles and midline structures are unremarkable. No acute extra-axial fluid collections. No mass effect. Vascular: No hyperdense vessel or unexpected calcification. Skull: Normal. Negative for fracture or focal lesion. Sinuses/Orbits: Mild polypoid mucosal thickening within the left frontal ethmoidal recess. Remaining sinuses are clear. Other: None IMPRESSION: 1. Stable chronic ischemic changes.  No acute intracranial process. Electronically Signed   By: Randa Ngo M.D.   On: 11/05/2019 09:00   CT Angio Chest PE W and/or Wo Contrast  Result Date: 11/07/2019 CLINICAL DATA:  Chest pain. EXAM: CT ANGIOGRAPHY CHEST WITH CONTRAST TECHNIQUE: Multidetector CT imaging of the chest was performed using the standard protocol during bolus administration of intravenous contrast. Multiplanar CT image reconstructions and MIPs were obtained to evaluate the vascular anatomy. CONTRAST:  150mL OMNIPAQUE IOHEXOL 350 MG/ML SOLN COMPARISON:  October 18, 2017  FINDINGS: Cardiovascular: There is mild calcification of the aortic arch. Satisfactory opacification of the pulmonary arteries to the segmental level. No evidence of pulmonary embolism. Normal heart size. No pericardial effusion. Mediastinum/Nodes: There is mild mediastinal lymphadenopathy. Lungs/Pleura: Mild areas of atelectasis and/or infiltrate are seen within the posterior aspect of the bilateral upper lobes with moderate severity or areas of bilateral posterior lower lobe atelectasis and/or infiltrate. There are small bilateral pleural effusions. No pneumothorax is identified. Upper Abdomen: There is a small hiatal hernia. Noninflamed diverticula are seen within the splenic flexure. Musculoskeletal: Multilevel degenerative changes seen throughout the thoracic spine Review of the MIP images confirms the above findings. IMPRESSION: 1. No evidence of pulmonary embolism. 2. Mild areas of atelectasis and/or infiltrate are seen within the posterior aspect of the bilateral upper lobes with moderate severity bilateral posterior lower lobe atelectasis and/or infiltrate. 3. Small bilateral pleural effusions. Aortic Atherosclerosis (ICD10-I70.0). Electronically Signed   By: Virgina Norfolk M.D.   On: 11/07/2019 17:26   DG Chest Port 1 View  Result Date: 11/07/2019 CLINICAL DATA:  Shortness of breath EXAM: PORTABLE CHEST 1 VIEW COMPARISON:  03/12/2019 FINDINGS: The heart size and mediastinal contours are stable. Loop recorder device noted. Mild pulmonary vascular congestion. No focal consolidation, pleural effusion, or pneumothorax. The visualized skeletal structures are unremarkable. IMPRESSION: Mild cardiomegaly and pulmonary vascular congestion without overt pulmonary edema. Electronically Signed   By: Davina Poke D.O.   On: 11/07/2019 13:59   DG Knee Complete 4 Views Left  Result Date: 11/05/2019 CLINICAL DATA:  [place] this morning, knee pain EXAM: LEFT KNEE - COMPLETE 4+ VIEW COMPARISON:  None. FINDINGS:  Frontal, bilateral oblique, lateral views of the left knee demonstrate 3 compartment osteoarthritis, most significant in the medial compartment with joint space narrowing and osteophyte formation. No fracture, subluxation, or dislocation. No joint effusion. IMPRESSION: 1. Prominent medial compartmental osteoarthritis. Less significant changes in the patellofemoral and lateral compartments. 2. No acute fracture. Electronically Signed   By: Randa Ngo M.D.   On: 11/05/2019 08:48   DG Knee Complete 4 Views Right  Result Date: 11/05/2019 CLINICAL DATA:  [place], bilateral knee pain EXAM: RIGHT KNEE - COMPLETE 4+ VIEW COMPARISON:  None. FINDINGS: Frontal, bilateral oblique, lateral views of the right knee are obtained. Moderate 3 compartmental osteoarthritis greatest in the medial compartment. Prominent joint space narrowing and osteophyte formation. No acute fracture, subluxation, or dislocation. No joint effusion. IMPRESSION: 1. Moderate 3 compartment osteoarthritis, greatest medially. 2. No acute fracture. Electronically Signed  By: Randa Ngo M.D.   On: 11/05/2019 08:47   CUP PACEART REMOTE DEVICE CHECK  Result Date: 10/16/2019 [facility] summary report received. Battery status OK. Normal device function. No new symptom episodes, tachy episodes, brady, or pause episodes. No new AF episodes. Monthly summary reports and ROV/PRN Kathy Breach, RN, CCDS, [employer]  DG Hips Bilat W or Wo Pelvis 2 Views  Result Date: 11/07/2019 CLINICAL DATA:  Pain status post fall EXAM: DG HIP (WITH OR WITHOUT PELVIS) 2V BILAT COMPARISON:  None. FINDINGS: There is no evidence of hip fracture or dislocation. There is no evidence of arthropathy or other focal bone abnormality. IMPRESSION: Negative. Electronically Signed   By: Constance Holster M.D.   On: 11/07/2019 23:53       Subjective: Patient seen and examined at the bedside this morning.  Hemodynamically stable for discharge today.  Discharge  Exam: Vitals:   11/11/19 0503 11/11/19 0813  BP: (!) 178/89 136/62  Pulse: 66 67  Resp: 16 18  Temp: 97.9 F (36.6 C) 97.6 F (36.4 C)  SpO2: 97% 98%   Vitals:   11/10/19 2120 11/11/19 0503 11/11/19 0700 11/11/19 0813  BP: (!) 159/75 (!) 178/89  136/62  Pulse: 63 66  67  Resp: 16 16  18   Temp: 98.1 F (36.7 C) 97.9 F (36.6 C)  97.6 F (36.4 C)  TempSrc: Oral Oral  Oral  SpO2: 96% 97%  98%  Weight:   106.4 kg   Height:   5\' 9"  (1.753 m)     General: Pt is alert, awake, not in acute distress Cardiovascular: RRR, S1/S2 +, no rubs, no gallops Respiratory: CTA bilaterally, no wheezing, no rhonchi Abdominal: Soft, NT, ND, bowel sounds + Extremities: no edema, no cyanosis    The results of significant diagnostics from this hospitalization (including imaging, microbiology, ancillary and laboratory) are listed below for reference.     Microbiology: Recent Results (from the past 240 hour(s))  Respiratory Panel by RT PCR (Flu A&B, Covid) - Nasopharyngeal Swab     Status: None   Collection Time: 11/07/19  1:32 PM   Specimen: Nasopharyngeal Swab  Result Value Ref Range Status   SARS Coronavirus 2 by RT PCR NEGATIVE NEGATIVE Final    Comment: (NOTE) SARS-CoV-2 target nucleic acids are NOT DETECTED. The SARS-CoV-2 RNA is generally detectable in upper respiratoy specimens during the acute phase of infection. The lowest concentration of SARS-CoV-2 viral copies this assay can detect is 131 copies/mL. A negative result does not preclude SARS-Cov-2 infection and should not be used as the sole basis for treatment or other patient management decisions. A negative result may occur with  improper specimen collection/handling, submission of specimen other than nasopharyngeal swab, presence of viral mutation(s) within the areas targeted by this assay, and inadequate number of viral copies (<131 copies/mL). A negative result must be combined with clinical observations, patient history,  and epidemiological information. The expected result is Negative. Fact Sheet for Patients:  PinkCheek.be Fact Sheet for Healthcare Providers:  GravelBags.it This test is not yet ap proved or cleared by the [place] FDA and  has been authorized for detection and/or diagnosis of SARS-CoV-2 by FDA under an Emergency Use Authorization (EUA). This EUA will remain  in effect (meaning this test can be used) for the duration of the COVID-19 declaration under Section 564(b)(1) of the Act, 21 U.S.C. section 360bbb-3(b)(1), unless the authorization is terminated or revoked sooner.    Influenza A by PCR NEGATIVE NEGATIVE  Final   Influenza B by PCR NEGATIVE NEGATIVE Final    Comment: (NOTE) The Xpert Xpress SARS-CoV-2/FLU/RSV assay is intended as an aid in  the diagnosis of influenza from Nasopharyngeal swab specimens and  should not be used as a sole basis for treatment. Nasal washings and  aspirates are unacceptable for Xpert Xpress SARS-CoV-2/FLU/RSV  testing. Fact Sheet for Patients: PinkCheek.be Fact Sheet for Healthcare Providers: GravelBags.it This test is not yet approved or cleared by the [place] FDA and  has been authorized for detection and/or diagnosis of SARS-CoV-2 by  FDA under an Emergency Use Authorization (EUA). This EUA will remain  in effect (meaning this test can be used) for the duration of the  Covid-19 declaration under Section 564(b)(1) of the Act, 21  U.S.C. section 360bbb-3(b)(1), unless the authorization is  terminated or revoked. Performed at [facility], [place] [address] [place], [place] [address]      Labs: BNP (last 3 results) Recent Labs    03/12/19 1610 11/07/19 1345  BNP 264.0* 123XX123*   Basic Metabolic Panel: Recent Labs  Lab 11/07/19 1344 11/07/19 1357 11/08/19 0616  NA 142 142 139  K 4.5 4.4 4.3  CL 107  --  106   CO2 28  --  25  GLUCOSE 166*  --  300*  BUN 17  --  19  CREATININE 0.88  --  0.86  CALCIUM 8.9  --  8.5*   Liver Function Tests: Recent Labs  Lab 11/07/19 1344  AST 20  ALT 18  ALKPHOS 74  BILITOT 0.4  PROT 6.9  ALBUMIN 3.4*   No results for input(s): LIPASE, AMYLASE in the last 168 hours. No results for input(s): AMMONIA in the last 168 hours. CBC: Recent Labs  Lab 11/07/19 1344 11/07/19 1357 11/08/19 0616  WBC 10.5  --  6.9  NEUTROABS 7.6  --   --   HGB 13.0 13.3 12.6  HCT 41.3 39.0 39.3  MCV 94.1  --  91.6  PLT 255  --  210   Cardiac Enzymes: No results for input(s): CKTOTAL, CKMB, CKMBINDEX, TROPONINI in the last 168 hours. BNP: Invalid input(s): POCBNP CBG: Recent Labs  Lab 11/10/19 0653 11/10/19 1145 11/10/19 1640 11/10/19 2142 11/11/19 0546  GLUCAP 133* 151* 232* 306* 150*   D-Dimer No results for input(s): DDIMER in the last 72 hours. Hgb A1c No results for input(s): HGBA1C in the last 72 hours. Lipid Profile No results for input(s): CHOL, HDL, LDLCALC, TRIG, CHOLHDL, LDLDIRECT in the last 72 hours. Thyroid function studies No results for input(s): TSH, T4TOTAL, T3FREE, THYROIDAB in the last 72 hours.  Invalid input(s): FREET3 Anemia work up No results for input(s): VITAMINB12, FOLATE, FERRITIN, TIBC, IRON, RETICCTPCT in the last 72 hours. Urinalysis    Component Value Date/Time   COLORURINE YELLOW 07/19/2017 1949   APPEARANCEUR CLEAR 07/19/2017 1949   LABSPEC 1.030 07/19/2017 1949   PHURINE 6.0 07/19/2017 1949   GLUCOSEU >=500 (A) 07/19/2017 1949   HGBUR NEGATIVE 07/19/2017 1949   HGBUR trace-lysed 09/12/2010 1538   BILIRUBINUR - 09/05/2019 [place] 07/19/2017 1949   PROTEINUR Negative 09/05/2019 1607   PROTEINUR NEGATIVE 07/19/2017 1949   UROBILINOGEN 0.2 09/05/2019 1607   UROBILINOGEN 0.2 11/10/2014 1225   NITRITE - 09/05/2019 1607   NITRITE NEGATIVE 07/19/2017 1949   LEUKOCYTESUR Small (1+) (A) 09/05/2019 1607    Sepsis Labs Invalid input(s): PROCALCITONIN,  WBC,  LACTICIDVEN Microbiology Recent Results (from the past 240 hour(s))  Respiratory Panel by RT PCR (Flu A&B, Covid) - Nasopharyngeal Swab     Status: None   Collection Time: 11/07/19  1:32 PM   Specimen: Nasopharyngeal Swab  Result Value Ref Range Status   SARS Coronavirus 2 by RT PCR NEGATIVE NEGATIVE Final    Comment: (NOTE) SARS-CoV-2 target nucleic acids are NOT DETECTED. The SARS-CoV-2 RNA is generally detectable in upper respiratoy specimens during the acute phase of infection. The lowest concentration of SARS-CoV-2 viral copies this assay can detect is 131 copies/mL. A negative result does not preclude SARS-Cov-2 infection and should not be used as the sole basis for treatment or other patient management decisions. A negative result may occur with  improper specimen collection/handling, submission of specimen other than nasopharyngeal swab, presence of viral mutation(s) within the areas targeted by this assay, and inadequate number of viral copies (<131 copies/mL). A negative result must be combined with clinical observations, patient history, and epidemiological information. The expected result is Negative. Fact Sheet for Patients:  PinkCheek.be Fact Sheet for Healthcare Providers:  GravelBags.it This test is not yet ap proved or cleared by the [place] FDA and  has been authorized for detection and/or diagnosis of SARS-CoV-2 by FDA under an Emergency Use Authorization (EUA). This EUA will remain  in effect (meaning this test can be used) for the duration of the COVID-19 declaration under Section 564(b)(1) of the Act, 21 U.S.C. section 360bbb-3(b)(1), unless the authorization is terminated or revoked sooner.    Influenza A by PCR NEGATIVE NEGATIVE Final   Influenza B by PCR NEGATIVE NEGATIVE Final    Comment: (NOTE) The Xpert Xpress SARS-CoV-2/FLU/RSV assay  is intended as an aid in  the diagnosis of influenza from Nasopharyngeal swab specimens and  should not be used as a sole basis for treatment. Nasal washings and  aspirates are unacceptable for Xpert Xpress SARS-CoV-2/FLU/RSV  testing. Fact Sheet for Patients: PinkCheek.be Fact Sheet for Healthcare Providers: GravelBags.it This test is not yet approved or cleared by the [place] FDA and  has been authorized for detection and/or diagnosis of SARS-CoV-2 by  FDA under an Emergency Use Authorization (EUA). This EUA will remain  in effect (meaning this test can be used) for the duration of the  Covid-19 declaration under Section 564(b)(1) of the Act, 21  U.S.C. section 360bbb-3(b)(1), unless the authorization is  terminated or revoked. Performed at [facility], [place] [address], [place], [place] [address]     Please note: You were cared for by a hospitalist during your hospital stay. Once you are discharged, your primary care physician will handle any further medical issues. Please note that NO REFILLS for any discharge medications will be authorized once you are discharged, as it is imperative that you return to your primary care physician (or establish a relationship with a primary care physician if you do not have one) for your post hospital discharge needs so that they can reassess your need for medications and monitor your lab values.    Time coordinating discharge: 40 minutes  SIGNED:   Shelly Coss, MD  [facility] 11/11/2019, 10:50 AM Pager [phone]  If 7PM-7AM, please contact night-coverage [email or web address] Password [email or web address]

## 2019-11-11 NOTE — Progress Notes (Signed)
Pt did not receive SS insulin for bedtime coverage. Pt informed of new insulin SS orders.  Rechecked CBG this AM. CBG 150. Pt stated " that's where I like my CBG to be". Pt educated on normal CBG 70-99. RN will continue to monitor

## 2019-11-12 ENCOUNTER — Telehealth: Payer: Self-pay

## 2019-11-12 NOTE — Telephone Encounter (Signed)
ATC the patient, unable to leave a voicemail.  This prescription has been sent 4 times. I am not sue why the pharmacy is not receiving it. At this point it would be best to have to patient pick up the prescription and take it to the pharmacy. Please see if the patient agrees when she calls back.

## 2019-11-12 NOTE — Telephone Encounter (Signed)
Transition Care Management Follow-up Telephone Call  Date of discharge and from where: 11/11/2019 from [facility]  How have you been since you were released from the hospital? "I'm feeling very, very weak".   Any questions or concerns? Yes ; has not able to pick up Freestyle glucometer from pharmacy. Notified CMA working with Dr. Sarajane Jews to help.  Items Reviewed:  Did the pt receive and understand the discharge instructions provided? Yes   Medications obtained and verified? Yes   Any new allergies since your discharge? No   Dietary orders reviewed? Yes  Do you have support at home? Yes   Other (ie: DME, Home Health, etc) yes; PT called 11/12/19 to initiate home services  Functional Questionnaire: (I = Independent and D = Dependent) ADL's: "my son [name] is here to help me"  Bathing/Dressing-    Meal Prep-   Eating-   Maintaining continence-   Transferring/Ambulation-   Managing Meds-    Follow up appointments reviewed:    PCP Hospital f/u appt confirmed? Yes  Scheduled to see Dr. Sarajane Jews virtually on 11/18/19 @ 10:45am.  [facility] f/u appt confirmed? N/A  Are transportation arrangements needed? No   If their condition worsens, is the pt aware to call  their PCP or go to the ED? Yes  Was the patient provided with contact information for the PCP's office or ED? Yes  Was the pt encouraged to call back with questions or concerns? Yes

## 2019-11-13 ENCOUNTER — Telehealth: Payer: Self-pay | Admitting: Family Medicine

## 2019-11-13 DIAGNOSIS — S46011D Strain of muscle(s) and tendon(s) of the rotator cuff of right shoulder, subsequent encounter: Secondary | ICD-10-CM | POA: Diagnosis not present

## 2019-11-13 DIAGNOSIS — E1165 Type 2 diabetes mellitus with hyperglycemia: Secondary | ICD-10-CM | POA: Diagnosis not present

## 2019-11-13 DIAGNOSIS — J439 Emphysema, unspecified: Secondary | ICD-10-CM | POA: Diagnosis not present

## 2019-11-13 DIAGNOSIS — J9601 Acute respiratory failure with hypoxia: Secondary | ICD-10-CM | POA: Diagnosis not present

## 2019-11-13 DIAGNOSIS — I5033 Acute on chronic diastolic (congestive) heart failure: Secondary | ICD-10-CM | POA: Diagnosis not present

## 2019-11-13 DIAGNOSIS — M48061 Spinal stenosis, lumbar region without neurogenic claudication: Secondary | ICD-10-CM | POA: Diagnosis not present

## 2019-11-13 DIAGNOSIS — I5022 Chronic systolic (congestive) heart failure: Secondary | ICD-10-CM | POA: Diagnosis not present

## 2019-11-13 DIAGNOSIS — I110 Hypertensive heart disease with heart failure: Secondary | ICD-10-CM | POA: Diagnosis not present

## 2019-11-13 DIAGNOSIS — I2510 Atherosclerotic heart disease of native coronary artery without angina pectoris: Secondary | ICD-10-CM | POA: Diagnosis not present

## 2019-11-13 NOTE — Telephone Encounter (Signed)
ATC 

## 2019-11-13 NOTE — Telephone Encounter (Signed)
April with [facility] needs verbal orders for skill nursing, diabetic and copd the frequency 1 week x 1, 2 week x 1, 2 weeks x 2.  April, says, that pt needs a new meter(actual) because she lost the meter she had. Thanks   April's # [phone]

## 2019-11-13 NOTE — Telephone Encounter (Signed)
Pt called and said she has found the 14-Day Meter, she no longer needs a new one.

## 2019-11-14 MED ORDER — BLOOD GLUCOSE MONITOR KIT: PACK | 0 refills | Status: DC

## 2019-11-14 NOTE — Telephone Encounter (Signed)
Orders have been given. Rx for a Meter has been faxed to the pharmacy.

## 2019-11-14 NOTE — Telephone Encounter (Signed)
noted 

## 2019-11-14 NOTE — Telephone Encounter (Signed)
Please okay the orders and get her anew glucometer

## 2019-11-14 NOTE — Telephone Encounter (Signed)
LM for April to call back.

## 2019-11-17 ENCOUNTER — Other Ambulatory Visit: Payer: Self-pay | Admitting: Family Medicine

## 2019-11-17 ENCOUNTER — Ambulatory Visit (INDEPENDENT_AMBULATORY_CARE_PROVIDER_SITE_OTHER): Payer: Medicare PPO | Admitting: *Deleted

## 2019-11-17 DIAGNOSIS — M48061 Spinal stenosis, lumbar region without neurogenic claudication: Secondary | ICD-10-CM | POA: Diagnosis not present

## 2019-11-17 DIAGNOSIS — I5022 Chronic systolic (congestive) heart failure: Secondary | ICD-10-CM | POA: Diagnosis not present

## 2019-11-17 DIAGNOSIS — J439 Emphysema, unspecified: Secondary | ICD-10-CM | POA: Diagnosis not present

## 2019-11-17 DIAGNOSIS — I2510 Atherosclerotic heart disease of native coronary artery without angina pectoris: Secondary | ICD-10-CM | POA: Diagnosis not present

## 2019-11-17 DIAGNOSIS — E1165 Type 2 diabetes mellitus with hyperglycemia: Secondary | ICD-10-CM | POA: Diagnosis not present

## 2019-11-17 DIAGNOSIS — S46011D Strain of muscle(s) and tendon(s) of the rotator cuff of right shoulder, subsequent encounter: Secondary | ICD-10-CM | POA: Diagnosis not present

## 2019-11-17 DIAGNOSIS — I110 Hypertensive heart disease with heart failure: Secondary | ICD-10-CM | POA: Diagnosis not present

## 2019-11-17 DIAGNOSIS — J9601 Acute respiratory failure with hypoxia: Secondary | ICD-10-CM | POA: Diagnosis not present

## 2019-11-17 DIAGNOSIS — I5033 Acute on chronic diastolic (congestive) heart failure: Secondary | ICD-10-CM | POA: Diagnosis not present

## 2019-11-17 DIAGNOSIS — I639 Cerebral infarction, unspecified: Secondary | ICD-10-CM

## 2019-11-17 LAB — CUP PACEART REMOTE DEVICE CHECK
Date Time Interrogation Session: 20210214231414
Implantable Pulse Generator Implant Date: 20181022

## 2019-11-17 NOTE — Telephone Encounter (Signed)
Last filled 05/21/2019 Last OV 10/31/2019  Ok to fill?

## 2019-11-18 ENCOUNTER — Telehealth (INDEPENDENT_AMBULATORY_CARE_PROVIDER_SITE_OTHER): Payer: Medicare PPO | Admitting: Family Medicine

## 2019-11-18 ENCOUNTER — Other Ambulatory Visit: Payer: Self-pay

## 2019-11-18 DIAGNOSIS — E1165 Type 2 diabetes mellitus with hyperglycemia: Secondary | ICD-10-CM

## 2019-11-18 DIAGNOSIS — I5022 Chronic systolic (congestive) heart failure: Secondary | ICD-10-CM | POA: Diagnosis not present

## 2019-11-18 DIAGNOSIS — G4730 Sleep apnea, unspecified: Secondary | ICD-10-CM | POA: Diagnosis not present

## 2019-11-18 DIAGNOSIS — R296 Repeated falls: Secondary | ICD-10-CM

## 2019-11-18 DIAGNOSIS — Z794 Long term (current) use of insulin: Secondary | ICD-10-CM

## 2019-11-18 DIAGNOSIS — J441 Chronic obstructive pulmonary disease with (acute) exacerbation: Secondary | ICD-10-CM | POA: Diagnosis not present

## 2019-11-18 DIAGNOSIS — R42 Dizziness and giddiness: Secondary | ICD-10-CM

## 2019-11-18 DIAGNOSIS — R531 Weakness: Secondary | ICD-10-CM | POA: Diagnosis not present

## 2019-11-18 DIAGNOSIS — I10 Essential (primary) hypertension: Secondary | ICD-10-CM | POA: Diagnosis not present

## 2019-11-18 DIAGNOSIS — I5033 Acute on chronic diastolic (congestive) heart failure: Secondary | ICD-10-CM

## 2019-11-18 MED ORDER — FUROSEMIDE 40 MG PO TABS: 80.0000 mg | ORAL_TABLET | Freq: Two times a day (BID) | ORAL | 0 refills | Status: DC

## 2019-11-18 NOTE — Progress Notes (Signed)
Virtual Visit via Telephone Note  I connected with the patient on 11/18/19 at 10:45 AM EST by telephone and verified that I am speaking with the correct person using two identifiers.   I discussed the limitations, risks, security and privacy concerns of performing an evaluation and management service by telephone and the availability of in person appointments. I also discussed with the patient that there may be a patient responsible charge related to this service. The patient expressed understanding and agreed to proceed.  Location patient: home Location provider: work or home office Participants present for the call: patient, provider Patient did not have a visit in the prior 7 days to address this/these issue(s).   History of Present Illness: Here to follow up a hospital stay from 11-07-19 to 11-11-19 for weakness, frequent falls, and SOB. A chest CTA showed no PE's, but did show her emphysema and extensive areas of atetectasis. She had tiny bilateral pleural effusions. No active pneumonia. She was treated with steroids and her breathing became easier. Her Lasix was reduced to 60 mg bid, however since going home her legs have swollen more. She has an appt to see Dr. Onnie Graham for her right shoulder in 2 days (she injured this during a fall at home last month).    Observations/Objective: Patient sounds cheerful and well on the phone. I do not appreciate any SOB. Speech and thought processing are grossly intact. Patient reported vitals:  Assessment and Plan: She is recovering from an exacerbation of her COPD. Her CAD is stable. Her CHF is stable but we will increase her Lasix back to 80 mg BID. Recheck as needed.  Alysia Penna, MD   Follow Up Instructions:     [phone] 5-10 [phone] 11-20 [phone] 21-30 I did not refer this patient for an OV in the next 24 hours for this/these issue(s).  I discussed the assessment and treatment plan with the patient. The patient was provided an opportunity to ask  questions and all were answered. The patient agreed with the plan and demonstrated an understanding of the instructions.   The patient was advised to call back or seek an in-person evaluation if the symptoms worsen or if the condition fails to improve as anticipated.  I provided 24 minutes of non-face-to-face time during this encounter.   Alysia Penna, MD

## 2019-11-18 NOTE — Progress Notes (Signed)
ILR Remote 

## 2019-11-19 DIAGNOSIS — J9601 Acute respiratory failure with hypoxia: Secondary | ICD-10-CM | POA: Diagnosis not present

## 2019-11-19 DIAGNOSIS — S46011D Strain of muscle(s) and tendon(s) of the rotator cuff of right shoulder, subsequent encounter: Secondary | ICD-10-CM | POA: Diagnosis not present

## 2019-11-19 DIAGNOSIS — I5022 Chronic systolic (congestive) heart failure: Secondary | ICD-10-CM | POA: Diagnosis not present

## 2019-11-19 DIAGNOSIS — I5033 Acute on chronic diastolic (congestive) heart failure: Secondary | ICD-10-CM | POA: Diagnosis not present

## 2019-11-19 DIAGNOSIS — J439 Emphysema, unspecified: Secondary | ICD-10-CM | POA: Diagnosis not present

## 2019-11-19 DIAGNOSIS — M48061 Spinal stenosis, lumbar region without neurogenic claudication: Secondary | ICD-10-CM | POA: Diagnosis not present

## 2019-11-19 DIAGNOSIS — E1165 Type 2 diabetes mellitus with hyperglycemia: Secondary | ICD-10-CM | POA: Diagnosis not present

## 2019-11-19 DIAGNOSIS — I110 Hypertensive heart disease with heart failure: Secondary | ICD-10-CM | POA: Diagnosis not present

## 2019-11-19 DIAGNOSIS — I2510 Atherosclerotic heart disease of native coronary artery without angina pectoris: Secondary | ICD-10-CM | POA: Diagnosis not present

## 2019-11-19 DIAGNOSIS — M25511 Pain in right shoulder: Secondary | ICD-10-CM | POA: Diagnosis not present

## 2019-11-21 DIAGNOSIS — I110 Hypertensive heart disease with heart failure: Secondary | ICD-10-CM | POA: Diagnosis not present

## 2019-11-21 DIAGNOSIS — E1165 Type 2 diabetes mellitus with hyperglycemia: Secondary | ICD-10-CM | POA: Diagnosis not present

## 2019-11-21 DIAGNOSIS — I2510 Atherosclerotic heart disease of native coronary artery without angina pectoris: Secondary | ICD-10-CM | POA: Diagnosis not present

## 2019-11-21 DIAGNOSIS — J9601 Acute respiratory failure with hypoxia: Secondary | ICD-10-CM | POA: Diagnosis not present

## 2019-11-21 DIAGNOSIS — S46011D Strain of muscle(s) and tendon(s) of the rotator cuff of right shoulder, subsequent encounter: Secondary | ICD-10-CM | POA: Diagnosis not present

## 2019-11-21 DIAGNOSIS — M48061 Spinal stenosis, lumbar region without neurogenic claudication: Secondary | ICD-10-CM | POA: Diagnosis not present

## 2019-11-21 DIAGNOSIS — I5022 Chronic systolic (congestive) heart failure: Secondary | ICD-10-CM | POA: Diagnosis not present

## 2019-11-21 DIAGNOSIS — I5033 Acute on chronic diastolic (congestive) heart failure: Secondary | ICD-10-CM | POA: Diagnosis not present

## 2019-11-21 DIAGNOSIS — J439 Emphysema, unspecified: Secondary | ICD-10-CM | POA: Diagnosis not present

## 2019-11-24 ENCOUNTER — Telehealth: Payer: Self-pay | Admitting: Family Medicine

## 2019-11-24 NOTE — Telephone Encounter (Signed)
Anderson Malta a Transport planner from [facility] called in and stated: Medication Refill: FreeStyle Libre supplies [facility] faxed over a refill request and just need the PCP to fill out and fax back.   Pt states that she received a cortizone shot in her shoulder and it made her sugar go up 438. She is wondering if she needs to take more insulin or needs guidance on what to do?   Pt also stated that she has been in the hospital for five days due to falling and from what she understood her right lung collapsed cause she couldn't breathe but came home this past week.  Pt is taking 80 units of tresiba and humalog 20 units with meals.   Pt can be reached at [phone]

## 2019-11-24 NOTE — Telephone Encounter (Signed)
Tell her not to take extra insulin even though her glucoses are high. The steroids from the shot will wear off over the next few days and her glucoses will settle back down. Stay on the same insulin dosages. We will take care of the forms when we get them

## 2019-11-24 NOTE — Telephone Encounter (Signed)
Please advise. Will keep an eye out for the forms

## 2019-11-24 NOTE — Telephone Encounter (Signed)
Patient is aware of Dr. Barbie Banner message. Nothing further needed.

## 2019-11-25 ENCOUNTER — Telehealth: Payer: Self-pay | Admitting: Family Medicine

## 2019-11-25 DIAGNOSIS — M48061 Spinal stenosis, lumbar region without neurogenic claudication: Secondary | ICD-10-CM

## 2019-11-25 DIAGNOSIS — F1190 Opioid use, unspecified, uncomplicated: Secondary | ICD-10-CM

## 2019-11-25 NOTE — Telephone Encounter (Signed)
The patient called back wanting to know if she can just come on in to leave a urine instead of waiting on Dr. Sarajane Jews. She said that if she can't do it today then just forget it. I advised the patent that we are waiting on orders from Dr. Sarajane Jews and she said to just forget it.  Please advise

## 2019-11-25 NOTE — Telephone Encounter (Signed)
The patient would like to know if she can come in today to have a urine spectrum. she said Dr. Sarajane Jews told her she could come in and provided urine  for  the Pain Mgmt, Profile 8 w/Conf, U  .  she would like to come in today because this is the only day she will have transportation contact number  [phone]

## 2019-11-25 NOTE — Telephone Encounter (Signed)
Left message for patient to call back. Please schedule a lab appointment for the patient when she calls back.

## 2019-11-25 NOTE — Telephone Encounter (Signed)
Spoke with patient. Appointment has been made

## 2019-11-25 NOTE — Telephone Encounter (Signed)
I put the order in. She can come by just to leave a sample

## 2019-11-26 ENCOUNTER — Other Ambulatory Visit: Payer: Self-pay

## 2019-11-26 ENCOUNTER — Other Ambulatory Visit (INDEPENDENT_AMBULATORY_CARE_PROVIDER_SITE_OTHER): Payer: Medicare PPO

## 2019-11-26 DIAGNOSIS — F1190 Opioid use, unspecified, uncomplicated: Secondary | ICD-10-CM | POA: Diagnosis not present

## 2019-11-27 LAB — PAIN MGMT, PROFILE 8 W/CONF, U
6 Acetylmorphine: NEGATIVE ng/mL
Alcohol Metabolites: NEGATIVE ng/mL (ref <500)
Amphetamines: NEGATIVE ng/mL
Benzodiazepines: NEGATIVE ng/mL
Buprenorphine, Urine: NEGATIVE ng/mL
Cocaine Metabolite: NEGATIVE ng/mL
Creatinine: 74.9 mg/dL
MDMA: NEGATIVE ng/mL
Marijuana Metabolite: NEGATIVE ng/mL
Opiates: NEGATIVE ng/mL
Oxidant: NEGATIVE ug/mL
Oxycodone: NEGATIVE ng/mL
pH: 6.8 (ref 4.5–9.0)

## 2019-11-28 DIAGNOSIS — S46011D Strain of muscle(s) and tendon(s) of the rotator cuff of right shoulder, subsequent encounter: Secondary | ICD-10-CM | POA: Diagnosis not present

## 2019-11-28 DIAGNOSIS — I5022 Chronic systolic (congestive) heart failure: Secondary | ICD-10-CM | POA: Diagnosis not present

## 2019-11-28 DIAGNOSIS — I2510 Atherosclerotic heart disease of native coronary artery without angina pectoris: Secondary | ICD-10-CM | POA: Diagnosis not present

## 2019-11-28 DIAGNOSIS — J9601 Acute respiratory failure with hypoxia: Secondary | ICD-10-CM | POA: Diagnosis not present

## 2019-11-28 DIAGNOSIS — E1165 Type 2 diabetes mellitus with hyperglycemia: Secondary | ICD-10-CM | POA: Diagnosis not present

## 2019-11-28 DIAGNOSIS — M48061 Spinal stenosis, lumbar region without neurogenic claudication: Secondary | ICD-10-CM | POA: Diagnosis not present

## 2019-11-28 DIAGNOSIS — I5033 Acute on chronic diastolic (congestive) heart failure: Secondary | ICD-10-CM | POA: Diagnosis not present

## 2019-11-28 DIAGNOSIS — J439 Emphysema, unspecified: Secondary | ICD-10-CM | POA: Diagnosis not present

## 2019-11-28 DIAGNOSIS — I110 Hypertensive heart disease with heart failure: Secondary | ICD-10-CM | POA: Diagnosis not present

## 2019-12-01 ENCOUNTER — Telehealth: Payer: Self-pay | Admitting: Family Medicine

## 2019-12-01 DIAGNOSIS — I110 Hypertensive heart disease with heart failure: Secondary | ICD-10-CM | POA: Diagnosis not present

## 2019-12-01 DIAGNOSIS — M48061 Spinal stenosis, lumbar region without neurogenic claudication: Secondary | ICD-10-CM | POA: Diagnosis not present

## 2019-12-01 DIAGNOSIS — I5033 Acute on chronic diastolic (congestive) heart failure: Secondary | ICD-10-CM | POA: Diagnosis not present

## 2019-12-01 DIAGNOSIS — J439 Emphysema, unspecified: Secondary | ICD-10-CM | POA: Diagnosis not present

## 2019-12-01 DIAGNOSIS — S46011D Strain of muscle(s) and tendon(s) of the rotator cuff of right shoulder, subsequent encounter: Secondary | ICD-10-CM | POA: Diagnosis not present

## 2019-12-01 DIAGNOSIS — E1165 Type 2 diabetes mellitus with hyperglycemia: Secondary | ICD-10-CM | POA: Diagnosis not present

## 2019-12-01 DIAGNOSIS — J9601 Acute respiratory failure with hypoxia: Secondary | ICD-10-CM | POA: Diagnosis not present

## 2019-12-01 DIAGNOSIS — I5022 Chronic systolic (congestive) heart failure: Secondary | ICD-10-CM | POA: Diagnosis not present

## 2019-12-01 DIAGNOSIS — I2510 Atherosclerotic heart disease of native coronary artery without angina pectoris: Secondary | ICD-10-CM | POA: Diagnosis not present

## 2019-12-01 NOTE — Telephone Encounter (Signed)
Noted. Will keep an eye out for the fax  

## 2019-12-01 NOTE — Telephone Encounter (Signed)
[facility] called wanting to know if we have received the fax for a glucometer.  Ria Comment stated that she has been trying to fax this back for 2 weeks now and [facility] still hasn't received the form.  I verified the fax number with [facility] and she said that is it [phone].   She also gave me an alternate fax number [phone] that she recommended that Ria Comment send it to the alternate fax number because it will go straight to the person that is requesting the form.

## 2019-12-02 DIAGNOSIS — E1165 Type 2 diabetes mellitus with hyperglycemia: Secondary | ICD-10-CM | POA: Diagnosis not present

## 2019-12-02 DIAGNOSIS — I5033 Acute on chronic diastolic (congestive) heart failure: Secondary | ICD-10-CM | POA: Diagnosis not present

## 2019-12-02 DIAGNOSIS — S46011D Strain of muscle(s) and tendon(s) of the rotator cuff of right shoulder, subsequent encounter: Secondary | ICD-10-CM | POA: Diagnosis not present

## 2019-12-02 DIAGNOSIS — J439 Emphysema, unspecified: Secondary | ICD-10-CM | POA: Diagnosis not present

## 2019-12-02 DIAGNOSIS — J9601 Acute respiratory failure with hypoxia: Secondary | ICD-10-CM | POA: Diagnosis not present

## 2019-12-02 DIAGNOSIS — I110 Hypertensive heart disease with heart failure: Secondary | ICD-10-CM | POA: Diagnosis not present

## 2019-12-02 DIAGNOSIS — M48061 Spinal stenosis, lumbar region without neurogenic claudication: Secondary | ICD-10-CM | POA: Diagnosis not present

## 2019-12-02 DIAGNOSIS — I2510 Atherosclerotic heart disease of native coronary artery without angina pectoris: Secondary | ICD-10-CM | POA: Diagnosis not present

## 2019-12-02 DIAGNOSIS — I5022 Chronic systolic (congestive) heart failure: Secondary | ICD-10-CM | POA: Diagnosis not present

## 2019-12-03 DIAGNOSIS — I69311 Memory deficit following cerebral infarction: Secondary | ICD-10-CM

## 2019-12-03 DIAGNOSIS — G2581 Restless legs syndrome: Secondary | ICD-10-CM

## 2019-12-03 DIAGNOSIS — Z6835 Body mass index (BMI) 35.0-35.9, adult: Secondary | ICD-10-CM

## 2019-12-03 DIAGNOSIS — M170 Bilateral primary osteoarthritis of knee: Secondary | ICD-10-CM

## 2019-12-03 DIAGNOSIS — I700 Atherosclerosis of aorta: Secondary | ICD-10-CM

## 2019-12-03 DIAGNOSIS — F339 Major depressive disorder, recurrent, unspecified: Secondary | ICD-10-CM

## 2019-12-03 DIAGNOSIS — J449 Chronic obstructive pulmonary disease, unspecified: Secondary | ICD-10-CM

## 2019-12-03 DIAGNOSIS — J9601 Acute respiratory failure with hypoxia: Secondary | ICD-10-CM | POA: Diagnosis not present

## 2019-12-03 DIAGNOSIS — I110 Hypertensive heart disease with heart failure: Secondary | ICD-10-CM | POA: Diagnosis not present

## 2019-12-03 DIAGNOSIS — E1142 Type 2 diabetes mellitus with diabetic polyneuropathy: Secondary | ICD-10-CM

## 2019-12-03 DIAGNOSIS — K219 Gastro-esophageal reflux disease without esophagitis: Secondary | ICD-10-CM

## 2019-12-03 DIAGNOSIS — Z9181 History of falling: Secondary | ICD-10-CM

## 2019-12-03 DIAGNOSIS — M65352 Trigger finger, left little finger: Secondary | ICD-10-CM

## 2019-12-03 DIAGNOSIS — M48061 Spinal stenosis, lumbar region without neurogenic claudication: Secondary | ICD-10-CM | POA: Diagnosis not present

## 2019-12-03 DIAGNOSIS — I5033 Acute on chronic diastolic (congestive) heart failure: Secondary | ICD-10-CM | POA: Diagnosis not present

## 2019-12-03 DIAGNOSIS — Z794 Long term (current) use of insulin: Secondary | ICD-10-CM

## 2019-12-03 DIAGNOSIS — G8929 Other chronic pain: Secondary | ICD-10-CM

## 2019-12-03 DIAGNOSIS — G4700 Insomnia, unspecified: Secondary | ICD-10-CM

## 2019-12-03 DIAGNOSIS — I2510 Atherosclerotic heart disease of native coronary artery without angina pectoris: Secondary | ICD-10-CM | POA: Diagnosis not present

## 2019-12-03 DIAGNOSIS — M5136 Other intervertebral disc degeneration, lumbar region: Secondary | ICD-10-CM

## 2019-12-03 DIAGNOSIS — I890 Lymphedema, not elsewhere classified: Secondary | ICD-10-CM

## 2019-12-03 DIAGNOSIS — M109 Gout, unspecified: Secondary | ICD-10-CM

## 2019-12-03 DIAGNOSIS — E1165 Type 2 diabetes mellitus with hyperglycemia: Secondary | ICD-10-CM | POA: Diagnosis not present

## 2019-12-03 DIAGNOSIS — G47419 Narcolepsy without cataplexy: Secondary | ICD-10-CM

## 2019-12-03 DIAGNOSIS — J439 Emphysema, unspecified: Secondary | ICD-10-CM | POA: Diagnosis not present

## 2019-12-03 DIAGNOSIS — Z86010 Personal history of colonic polyps: Secondary | ICD-10-CM

## 2019-12-03 DIAGNOSIS — I5022 Chronic systolic (congestive) heart failure: Secondary | ICD-10-CM | POA: Diagnosis not present

## 2019-12-03 DIAGNOSIS — Z7902 Long term (current) use of antithrombotics/antiplatelets: Secondary | ICD-10-CM

## 2019-12-03 DIAGNOSIS — G4733 Obstructive sleep apnea (adult) (pediatric): Secondary | ICD-10-CM

## 2019-12-03 DIAGNOSIS — S46011D Strain of muscle(s) and tendon(s) of the rotator cuff of right shoulder, subsequent encounter: Secondary | ICD-10-CM | POA: Diagnosis not present

## 2019-12-03 DIAGNOSIS — E1151 Type 2 diabetes mellitus with diabetic peripheral angiopathy without gangrene: Secondary | ICD-10-CM

## 2019-12-04 ENCOUNTER — Other Ambulatory Visit: Payer: Self-pay

## 2019-12-04 DIAGNOSIS — J9601 Acute respiratory failure with hypoxia: Secondary | ICD-10-CM | POA: Diagnosis not present

## 2019-12-04 DIAGNOSIS — I5033 Acute on chronic diastolic (congestive) heart failure: Secondary | ICD-10-CM | POA: Diagnosis not present

## 2019-12-04 DIAGNOSIS — E1165 Type 2 diabetes mellitus with hyperglycemia: Secondary | ICD-10-CM | POA: Diagnosis not present

## 2019-12-04 DIAGNOSIS — J439 Emphysema, unspecified: Secondary | ICD-10-CM | POA: Diagnosis not present

## 2019-12-04 DIAGNOSIS — I110 Hypertensive heart disease with heart failure: Secondary | ICD-10-CM | POA: Diagnosis not present

## 2019-12-04 DIAGNOSIS — I5022 Chronic systolic (congestive) heart failure: Secondary | ICD-10-CM | POA: Diagnosis not present

## 2019-12-04 DIAGNOSIS — I2510 Atherosclerotic heart disease of native coronary artery without angina pectoris: Secondary | ICD-10-CM | POA: Diagnosis not present

## 2019-12-04 DIAGNOSIS — S46011D Strain of muscle(s) and tendon(s) of the rotator cuff of right shoulder, subsequent encounter: Secondary | ICD-10-CM | POA: Diagnosis not present

## 2019-12-04 DIAGNOSIS — M48061 Spinal stenosis, lumbar region without neurogenic claudication: Secondary | ICD-10-CM | POA: Diagnosis not present

## 2019-12-04 MED ORDER — TRESIBA FLEXTOUCH 100 UNIT/ML ~~LOC~~ SOPN: PEN_INJECTOR | SUBCUTANEOUS | 2 refills | Status: DC

## 2019-12-04 NOTE — Telephone Encounter (Signed)
Forms received, completed and faxed back.

## 2019-12-04 NOTE — Telephone Encounter (Signed)
Melanie Mcdonald with [facility] is calling stating that she is needing the forms to be re-faxed due to her not receiving them.  She is aware that provider and CMA is out of the office and will address on tomorrow (12/05/2019).  You can fax it to [phone] and you also can call her at [phone].

## 2019-12-05 NOTE — Telephone Encounter (Signed)
Forms have been refaxed. LM for sara.

## 2019-12-08 ENCOUNTER — Telehealth: Payer: Self-pay | Admitting: Family Medicine

## 2019-12-08 DIAGNOSIS — J9601 Acute respiratory failure with hypoxia: Secondary | ICD-10-CM | POA: Diagnosis not present

## 2019-12-08 DIAGNOSIS — I10 Essential (primary) hypertension: Secondary | ICD-10-CM

## 2019-12-08 DIAGNOSIS — S46011D Strain of muscle(s) and tendon(s) of the rotator cuff of right shoulder, subsequent encounter: Secondary | ICD-10-CM | POA: Diagnosis not present

## 2019-12-08 DIAGNOSIS — I2510 Atherosclerotic heart disease of native coronary artery without angina pectoris: Secondary | ICD-10-CM

## 2019-12-08 DIAGNOSIS — I739 Peripheral vascular disease, unspecified: Secondary | ICD-10-CM

## 2019-12-08 DIAGNOSIS — E1165 Type 2 diabetes mellitus with hyperglycemia: Secondary | ICD-10-CM | POA: Diagnosis not present

## 2019-12-08 DIAGNOSIS — I110 Hypertensive heart disease with heart failure: Secondary | ICD-10-CM | POA: Diagnosis not present

## 2019-12-08 DIAGNOSIS — J439 Emphysema, unspecified: Secondary | ICD-10-CM | POA: Diagnosis not present

## 2019-12-08 DIAGNOSIS — M48061 Spinal stenosis, lumbar region without neurogenic claudication: Secondary | ICD-10-CM | POA: Diagnosis not present

## 2019-12-08 DIAGNOSIS — G4730 Sleep apnea, unspecified: Secondary | ICD-10-CM

## 2019-12-08 DIAGNOSIS — I5022 Chronic systolic (congestive) heart failure: Secondary | ICD-10-CM | POA: Diagnosis not present

## 2019-12-08 DIAGNOSIS — I5033 Acute on chronic diastolic (congestive) heart failure: Secondary | ICD-10-CM

## 2019-12-08 MED ORDER — TRESIBA FLEXTOUCH 100 UNIT/ML ~~LOC~~ SOPN: PEN_INJECTOR | SUBCUTANEOUS | 2 refills | Status: DC

## 2019-12-08 MED ORDER — FUROSEMIDE 40 MG PO TABS: 80.0000 mg | ORAL_TABLET | Freq: Two times a day (BID) | ORAL | 0 refills | Status: DC

## 2019-12-08 MED ORDER — SPIRONOLACTONE 25 MG PO TABS: ORAL_TABLET | ORAL | 1 refills | Status: DC

## 2019-12-08 NOTE — Telephone Encounter (Signed)
Pt is calling in stating that she needs a refill on her Tresiba insulin pen 100 unit,spironolactone (ALDACTONE) 25 MG and furosemide (LASIX) 40 MG   Pharm:  [facility] on [address] 30 days worth.  And would like to get 90 days worth of the insulin Tyler Aas) to come from [employer] [phone].    Pt state if you need to speak with her please give her a call.

## 2019-12-08 NOTE — Addendum Note (Signed)
Addended by: Rebecca Eaton on: 12/08/2019 04:49 PM   Modules accepted: Orders

## 2019-12-08 NOTE — Telephone Encounter (Signed)
Rx has been sent to the requested pharmacy.

## 2019-12-08 NOTE — Telephone Encounter (Signed)
Prescriptions have been sent in. Patient is aware.

## 2019-12-08 NOTE — Telephone Encounter (Signed)
Pt called and would like to cancel prescription that she requested to go to [facility]. She would only like the medication that was sent to [facility].

## 2019-12-09 DIAGNOSIS — I110 Hypertensive heart disease with heart failure: Secondary | ICD-10-CM | POA: Diagnosis not present

## 2019-12-09 DIAGNOSIS — E1165 Type 2 diabetes mellitus with hyperglycemia: Secondary | ICD-10-CM | POA: Diagnosis not present

## 2019-12-09 DIAGNOSIS — I5033 Acute on chronic diastolic (congestive) heart failure: Secondary | ICD-10-CM | POA: Diagnosis not present

## 2019-12-09 DIAGNOSIS — J439 Emphysema, unspecified: Secondary | ICD-10-CM | POA: Diagnosis not present

## 2019-12-09 DIAGNOSIS — M48061 Spinal stenosis, lumbar region without neurogenic claudication: Secondary | ICD-10-CM | POA: Diagnosis not present

## 2019-12-09 DIAGNOSIS — S46011D Strain of muscle(s) and tendon(s) of the rotator cuff of right shoulder, subsequent encounter: Secondary | ICD-10-CM | POA: Diagnosis not present

## 2019-12-09 DIAGNOSIS — I2510 Atherosclerotic heart disease of native coronary artery without angina pectoris: Secondary | ICD-10-CM | POA: Diagnosis not present

## 2019-12-09 DIAGNOSIS — I5022 Chronic systolic (congestive) heart failure: Secondary | ICD-10-CM | POA: Diagnosis not present

## 2019-12-09 DIAGNOSIS — J9601 Acute respiratory failure with hypoxia: Secondary | ICD-10-CM | POA: Diagnosis not present

## 2019-12-10 ENCOUNTER — Telehealth: Payer: Self-pay | Admitting: *Deleted

## 2019-12-10 NOTE — Telephone Encounter (Signed)
AJ from [employer] [phone] says they need PT's script to be called in in a form PWO . [name] DOB 12-10-1932

## 2019-12-10 NOTE — Telephone Encounter (Signed)
Please get more information. I have no idea what they are talking about

## 2019-12-11 NOTE — Telephone Encounter (Signed)
Called [facility] and was on hold for 15 minutes. Will call back

## 2019-12-12 ENCOUNTER — Other Ambulatory Visit: Payer: Self-pay | Admitting: *Deleted

## 2019-12-12 MED ORDER — TRESIBA FLEXTOUCH 100 UNIT/ML ~~LOC~~ SOPN: PEN_INJECTOR | SUBCUTANEOUS | 2 refills | Status: DC

## 2019-12-13 DIAGNOSIS — I5022 Chronic systolic (congestive) heart failure: Secondary | ICD-10-CM | POA: Diagnosis not present

## 2019-12-13 DIAGNOSIS — S46011D Strain of muscle(s) and tendon(s) of the rotator cuff of right shoulder, subsequent encounter: Secondary | ICD-10-CM | POA: Diagnosis not present

## 2019-12-13 DIAGNOSIS — I2510 Atherosclerotic heart disease of native coronary artery without angina pectoris: Secondary | ICD-10-CM | POA: Diagnosis not present

## 2019-12-13 DIAGNOSIS — J439 Emphysema, unspecified: Secondary | ICD-10-CM | POA: Diagnosis not present

## 2019-12-13 DIAGNOSIS — I5033 Acute on chronic diastolic (congestive) heart failure: Secondary | ICD-10-CM | POA: Diagnosis not present

## 2019-12-13 DIAGNOSIS — I110 Hypertensive heart disease with heart failure: Secondary | ICD-10-CM | POA: Diagnosis not present

## 2019-12-13 DIAGNOSIS — E1165 Type 2 diabetes mellitus with hyperglycemia: Secondary | ICD-10-CM | POA: Diagnosis not present

## 2019-12-13 DIAGNOSIS — M48061 Spinal stenosis, lumbar region without neurogenic claudication: Secondary | ICD-10-CM | POA: Diagnosis not present

## 2019-12-13 DIAGNOSIS — J9601 Acute respiratory failure with hypoxia: Secondary | ICD-10-CM | POA: Diagnosis not present

## 2019-12-17 DIAGNOSIS — I2510 Atherosclerotic heart disease of native coronary artery without angina pectoris: Secondary | ICD-10-CM | POA: Diagnosis not present

## 2019-12-17 DIAGNOSIS — M48061 Spinal stenosis, lumbar region without neurogenic claudication: Secondary | ICD-10-CM | POA: Diagnosis not present

## 2019-12-17 DIAGNOSIS — I110 Hypertensive heart disease with heart failure: Secondary | ICD-10-CM | POA: Diagnosis not present

## 2019-12-17 DIAGNOSIS — S46011D Strain of muscle(s) and tendon(s) of the rotator cuff of right shoulder, subsequent encounter: Secondary | ICD-10-CM | POA: Diagnosis not present

## 2019-12-17 DIAGNOSIS — J439 Emphysema, unspecified: Secondary | ICD-10-CM | POA: Diagnosis not present

## 2019-12-17 DIAGNOSIS — I5033 Acute on chronic diastolic (congestive) heart failure: Secondary | ICD-10-CM | POA: Diagnosis not present

## 2019-12-17 DIAGNOSIS — I5022 Chronic systolic (congestive) heart failure: Secondary | ICD-10-CM | POA: Diagnosis not present

## 2019-12-17 DIAGNOSIS — J9601 Acute respiratory failure with hypoxia: Secondary | ICD-10-CM | POA: Diagnosis not present

## 2019-12-17 DIAGNOSIS — E1165 Type 2 diabetes mellitus with hyperglycemia: Secondary | ICD-10-CM | POA: Diagnosis not present

## 2019-12-18 ENCOUNTER — Ambulatory Visit (INDEPENDENT_AMBULATORY_CARE_PROVIDER_SITE_OTHER): Payer: Medicare PPO | Admitting: *Deleted

## 2019-12-18 DIAGNOSIS — I110 Hypertensive heart disease with heart failure: Secondary | ICD-10-CM | POA: Diagnosis not present

## 2019-12-18 DIAGNOSIS — I639 Cerebral infarction, unspecified: Secondary | ICD-10-CM

## 2019-12-18 DIAGNOSIS — I5033 Acute on chronic diastolic (congestive) heart failure: Secondary | ICD-10-CM | POA: Diagnosis not present

## 2019-12-18 DIAGNOSIS — S46011D Strain of muscle(s) and tendon(s) of the rotator cuff of right shoulder, subsequent encounter: Secondary | ICD-10-CM | POA: Diagnosis not present

## 2019-12-18 DIAGNOSIS — I5022 Chronic systolic (congestive) heart failure: Secondary | ICD-10-CM | POA: Diagnosis not present

## 2019-12-18 DIAGNOSIS — I2510 Atherosclerotic heart disease of native coronary artery without angina pectoris: Secondary | ICD-10-CM | POA: Diagnosis not present

## 2019-12-18 DIAGNOSIS — M48061 Spinal stenosis, lumbar region without neurogenic claudication: Secondary | ICD-10-CM | POA: Diagnosis not present

## 2019-12-18 DIAGNOSIS — J439 Emphysema, unspecified: Secondary | ICD-10-CM | POA: Diagnosis not present

## 2019-12-18 DIAGNOSIS — J9601 Acute respiratory failure with hypoxia: Secondary | ICD-10-CM | POA: Diagnosis not present

## 2019-12-18 DIAGNOSIS — E1165 Type 2 diabetes mellitus with hyperglycemia: Secondary | ICD-10-CM | POA: Diagnosis not present

## 2019-12-18 LAB — CUP PACEART REMOTE DEVICE CHECK
Date Time Interrogation Session: 20210318011629
Implantable Pulse Generator Implant Date: 20181022

## 2019-12-18 NOTE — Progress Notes (Signed)
ILR Remote 

## 2019-12-22 NOTE — Telephone Encounter (Signed)
Tried calling [facility] number, message stating that they were closed and to call back later.

## 2019-12-22 NOTE — Telephone Encounter (Signed)
[facility] form placed in doctor's folder for signature.

## 2019-12-23 DIAGNOSIS — M170 Bilateral primary osteoarthritis of knee: Secondary | ICD-10-CM | POA: Diagnosis not present

## 2019-12-23 DIAGNOSIS — M1711 Unilateral primary osteoarthritis, right knee: Secondary | ICD-10-CM | POA: Diagnosis not present

## 2019-12-23 DIAGNOSIS — E109 Type 1 diabetes mellitus without complications: Secondary | ICD-10-CM | POA: Diagnosis not present

## 2019-12-24 ENCOUNTER — Encounter: Payer: Self-pay | Admitting: Family Medicine

## 2019-12-24 ENCOUNTER — Ambulatory Visit (INDEPENDENT_AMBULATORY_CARE_PROVIDER_SITE_OTHER): Payer: Medicare PPO | Admitting: Family Medicine

## 2019-12-24 ENCOUNTER — Telehealth: Payer: Self-pay | Admitting: Family Medicine

## 2019-12-24 ENCOUNTER — Other Ambulatory Visit: Payer: Self-pay

## 2019-12-24 VITALS — BP 138/62 | HR 95 | Temp 97.7°F

## 2019-12-24 DIAGNOSIS — M48061 Spinal stenosis, lumbar region without neurogenic claudication: Secondary | ICD-10-CM | POA: Diagnosis not present

## 2019-12-24 DIAGNOSIS — G4730 Sleep apnea, unspecified: Secondary | ICD-10-CM | POA: Diagnosis not present

## 2019-12-24 DIAGNOSIS — I890 Lymphedema, not elsewhere classified: Secondary | ICD-10-CM | POA: Diagnosis not present

## 2019-12-24 DIAGNOSIS — I10 Essential (primary) hypertension: Secondary | ICD-10-CM

## 2019-12-24 DIAGNOSIS — R32 Unspecified urinary incontinence: Secondary | ICD-10-CM | POA: Diagnosis not present

## 2019-12-24 DIAGNOSIS — M8949 Other hypertrophic osteoarthropathy, multiple sites: Secondary | ICD-10-CM

## 2019-12-24 DIAGNOSIS — I5033 Acute on chronic diastolic (congestive) heart failure: Secondary | ICD-10-CM | POA: Diagnosis not present

## 2019-12-24 DIAGNOSIS — M159 Polyosteoarthritis, unspecified: Secondary | ICD-10-CM

## 2019-12-24 MED ORDER — OXYCODONE-ACETAMINOPHEN 10-325 MG PO TABS: 1.0000 | ORAL_TABLET | Freq: Four times a day (QID) | ORAL | 0 refills | Status: AC | PRN

## 2019-12-24 MED ORDER — OXYCODONE-ACETAMINOPHEN 10-325 MG PO TABS: 1.0000 | ORAL_TABLET | Freq: Four times a day (QID) | ORAL | 0 refills | Status: DC | PRN

## 2019-12-24 MED ORDER — FUROSEMIDE 40 MG PO TABS: 80.0000 mg | ORAL_TABLET | Freq: Two times a day (BID) | ORAL | 11 refills | Status: DC

## 2019-12-24 NOTE — Telephone Encounter (Signed)
error 

## 2019-12-24 NOTE — Progress Notes (Signed)
   Subjective:    Patient ID: Melanie Mcdonald, female    DOB: [birth year], 86 y.o.   MRN: [id number]  HPI Here for pain management. She is struggling with low back pain. She has had 2 injections per Dr. Nelva Bush but these have not helped. She wants to get a second opinion from another pain clinic. She also wants a referral to Urology for her leaky urine.  Indication for chronic opioid: osteoarthritis Medication and dose: Percocet 10-325  # pills per month: 120 Last UDS date: 11-26-19 Opioid Treatment Agreement signed (Y/N): 03-15-18 Opioid Treatment Agreement last reviewed with patient:   12-24-19 [facility] reviewed this encounter (include red flags):  12-24-19   Review of Systems  Constitutional: Negative.   Respiratory: Negative.   Cardiovascular: Positive for leg swelling. Negative for chest pain and palpitations.  Musculoskeletal: Positive for arthralgias and back pain.       Objective:   Physical Exam Constitutional:      Comments: Walks with her walker   Cardiovascular:     Rate and Rhythm: Normal rate and regular rhythm.     Pulses: Normal pulses.     Heart sounds: Normal heart sounds.  Pulmonary:     Effort: Pulmonary effort is normal.     Breath sounds: Normal breath sounds.  Musculoskeletal:     Comments: 4+ edema in both lower legs   Neurological:     Mental Status: She is alert.           Assessment & Plan:  Pain management, meds were refilled. We will refer her to [facility] pain clinic. Refer to Urology for incontinence. She has not seen Dr. Ena Dawley for over a year, so I asked her to make an appt with her soon. Alysia Penna, MD

## 2019-12-24 NOTE — Telephone Encounter (Signed)
Form has been faxed with confirmation.  

## 2019-12-25 DIAGNOSIS — I2510 Atherosclerotic heart disease of native coronary artery without angina pectoris: Secondary | ICD-10-CM | POA: Diagnosis not present

## 2019-12-25 DIAGNOSIS — M48061 Spinal stenosis, lumbar region without neurogenic claudication: Secondary | ICD-10-CM | POA: Diagnosis not present

## 2019-12-25 DIAGNOSIS — S46011D Strain of muscle(s) and tendon(s) of the rotator cuff of right shoulder, subsequent encounter: Secondary | ICD-10-CM | POA: Diagnosis not present

## 2019-12-25 DIAGNOSIS — J9601 Acute respiratory failure with hypoxia: Secondary | ICD-10-CM | POA: Diagnosis not present

## 2019-12-25 DIAGNOSIS — J439 Emphysema, unspecified: Secondary | ICD-10-CM | POA: Diagnosis not present

## 2019-12-25 DIAGNOSIS — I5022 Chronic systolic (congestive) heart failure: Secondary | ICD-10-CM | POA: Diagnosis not present

## 2019-12-25 DIAGNOSIS — I110 Hypertensive heart disease with heart failure: Secondary | ICD-10-CM | POA: Diagnosis not present

## 2019-12-25 DIAGNOSIS — I5033 Acute on chronic diastolic (congestive) heart failure: Secondary | ICD-10-CM | POA: Diagnosis not present

## 2019-12-25 DIAGNOSIS — E1165 Type 2 diabetes mellitus with hyperglycemia: Secondary | ICD-10-CM | POA: Diagnosis not present

## 2019-12-25 NOTE — Telephone Encounter (Signed)
Left message to return phone call.

## 2019-12-25 NOTE — Telephone Encounter (Signed)
Okay to give verbal orders?  ?

## 2019-12-25 NOTE — Telephone Encounter (Signed)
Please okay the orders  

## 2019-12-25 NOTE — Telephone Encounter (Signed)
Jim from [facility] is needing verbal orders for occupational therapy-1w2  Clair Gulling can be reached at [phone] -ok to leave a detailed message per Clair Gulling

## 2019-12-26 NOTE — Telephone Encounter (Signed)
Verbal orders given to Jim. 

## 2019-12-26 NOTE — Telephone Encounter (Signed)
Left message to return phone call.

## 2019-12-29 DIAGNOSIS — E1165 Type 2 diabetes mellitus with hyperglycemia: Secondary | ICD-10-CM | POA: Diagnosis not present

## 2019-12-30 ENCOUNTER — Telehealth: Payer: Self-pay | Admitting: Family Medicine

## 2019-12-30 NOTE — Telephone Encounter (Signed)
Pt called to advised that she had two mini stokes one today and one yesterday. She said that she is ok. Offered to transfer to triage nurse pt declined.

## 2019-12-31 ENCOUNTER — Other Ambulatory Visit: Payer: Self-pay

## 2019-12-31 ENCOUNTER — Telehealth (INDEPENDENT_AMBULATORY_CARE_PROVIDER_SITE_OTHER): Payer: Medicare PPO | Admitting: Family Medicine

## 2019-12-31 DIAGNOSIS — M48061 Spinal stenosis, lumbar region without neurogenic claudication: Secondary | ICD-10-CM | POA: Diagnosis not present

## 2019-12-31 DIAGNOSIS — G459 Transient cerebral ischemic attack, unspecified: Secondary | ICD-10-CM | POA: Diagnosis not present

## 2019-12-31 DIAGNOSIS — R42 Dizziness and giddiness: Secondary | ICD-10-CM | POA: Diagnosis not present

## 2019-12-31 DIAGNOSIS — R531 Weakness: Secondary | ICD-10-CM

## 2019-12-31 DIAGNOSIS — H8303 Labyrinthitis, bilateral: Secondary | ICD-10-CM

## 2019-12-31 NOTE — Progress Notes (Signed)
Virtual Visit via Telephone Note  I connected with the patient on 12/31/19 at  2:15 PM EDT by telephone and verified that I am speaking with the correct person using two identifiers.   I discussed the limitations, risks, security and privacy concerns of performing an evaluation and management service by telephone and the availability of in person appointments. I also discussed with the patient that there may be a patient responsible charge related to this service. The patient expressed understanding and agreed to proceed.  Location patient: home Location provider: work or home office Participants present for the call: patient, provider Patient did not have a visit in the prior 7 days to address this/these issue(s).   History of Present Illness: Here to ask about dizzy spells. She says she has had several "mini strokes" this week. But I think this is just her vertigo acting up. She has spells of dizziness and she has to sit down. She gets relief by taking Meclizine. No headaches, no chest pain or SOB. No neurologic deficits otherwise. Her glucoses have been in the 200's. She does not check her BP.    Observations/Objective: Patient sounds cheerful and well on the phone. I do not appreciate any SOB. Speech and thought processing are grossly intact. Patient reported vitals:  Assessment and Plan: She has been experiencing more vertigo this week. She is to drink more water, and she can use Meclizine as needed. I asked her to check her BP at least once a day and to report back to use early next week.  Alysia Penna, MD   Follow Up Instructions:     [phone] 5-10 [phone] 11-20 [phone] 21-30 I did not refer this patient for an OV in the next 24 hours for this/these issue(s).  I discussed the assessment and treatment plan with the patient. The patient was provided an opportunity to ask questions and all were answered. The patient agreed with the plan and demonstrated an understanding of the  instructions.   The patient was advised to call back or seek an in-person evaluation if the symptoms worsen or if the condition fails to improve as anticipated.  I provided 15 minutes of non-face-to-face time during this encounter.   Alysia Penna, MD

## 2019-12-31 NOTE — Telephone Encounter (Signed)
Noted  

## 2020-01-01 ENCOUNTER — Telehealth: Payer: Self-pay | Admitting: Family Medicine

## 2020-01-01 DIAGNOSIS — R296 Repeated falls: Secondary | ICD-10-CM

## 2020-01-01 DIAGNOSIS — R531 Weakness: Secondary | ICD-10-CM

## 2020-01-01 NOTE — Telephone Encounter (Signed)
Melissa with [facility] is calling in with concerns of the pt stating that they were in the process of discharging the pt.  But when they called to check on the pt the son stated that the pt has had 3 TIA'S and they do not want to go to the ER run up the bill running test and say the same thing that they said previously.  Melissa had concern that they may need to have Hospice or another group check on the pt since [facility] will be doing a discharge next week.  Lenna Sciara is aware that Dr. Sarajane Jews is not in the office and we will be closed on 01/02/2020.  Melissa was wanting to speak with someone in the office today.

## 2020-01-01 NOTE — Telephone Encounter (Signed)
Spoke with Melissa. Melissa reports that the family is refusing to let OT work with patient. Melissa advised patient to go to ED if she is continuing to have mini strokes, patient and family declined to go. I advised Melissa that is all we can do at this time.

## 2020-01-01 NOTE — Telephone Encounter (Signed)
Attempted to contact [facility]. No answer LVM for Melissa to return call.

## 2020-01-06 NOTE — Telephone Encounter (Signed)
Patient is interested in palliative care referral.  Patient states " I want to stay home, I don't have many days left." Informed patient that this service can come out to her home. Patient also wants PCP to know she fell on Saturday, and think she broke her pinky finger on left hand and hurt her right shoulder. EMS had to come pick her up out the floor.  Patient verbalizes she hasn't heard back from urologist, and she has an appointment with pain clinic.

## 2020-01-06 NOTE — Addendum Note (Signed)
Addended by: Alysia Penna A on: 01/06/2020 04:54 PM   Modules accepted: Orders

## 2020-01-06 NOTE — Telephone Encounter (Signed)
I agree, this is a tough situation. I do not think she is appropriate for Hospice right now, but I think a referral for Palliative care would be a good idea. See what the family thinks

## 2020-01-06 NOTE — Telephone Encounter (Signed)
I did the referral to Palliative care

## 2020-01-08 ENCOUNTER — Telehealth: Payer: Self-pay

## 2020-01-08 NOTE — Telephone Encounter (Signed)
Referral for Palliative Care received. Attempted to contact patient. Phone is busy

## 2020-01-09 DIAGNOSIS — J439 Emphysema, unspecified: Secondary | ICD-10-CM | POA: Diagnosis not present

## 2020-01-09 DIAGNOSIS — J9601 Acute respiratory failure with hypoxia: Secondary | ICD-10-CM | POA: Diagnosis not present

## 2020-01-09 DIAGNOSIS — I110 Hypertensive heart disease with heart failure: Secondary | ICD-10-CM | POA: Diagnosis not present

## 2020-01-09 DIAGNOSIS — M48061 Spinal stenosis, lumbar region without neurogenic claudication: Secondary | ICD-10-CM | POA: Diagnosis not present

## 2020-01-09 DIAGNOSIS — I2510 Atherosclerotic heart disease of native coronary artery without angina pectoris: Secondary | ICD-10-CM | POA: Diagnosis not present

## 2020-01-09 DIAGNOSIS — S46011D Strain of muscle(s) and tendon(s) of the rotator cuff of right shoulder, subsequent encounter: Secondary | ICD-10-CM | POA: Diagnosis not present

## 2020-01-09 DIAGNOSIS — E1165 Type 2 diabetes mellitus with hyperglycemia: Secondary | ICD-10-CM | POA: Diagnosis not present

## 2020-01-09 DIAGNOSIS — I5022 Chronic systolic (congestive) heart failure: Secondary | ICD-10-CM | POA: Diagnosis not present

## 2020-01-09 DIAGNOSIS — I5033 Acute on chronic diastolic (congestive) heart failure: Secondary | ICD-10-CM | POA: Diagnosis not present

## 2020-01-14 ENCOUNTER — Telehealth: Payer: Self-pay

## 2020-01-14 NOTE — Telephone Encounter (Signed)
Phone call placed to patient to introduce Palliative Care and offer to schedule visit with NP. Patient requested that the initial visit be made via telehealth. Verbal consent obtained.Visit scheduled for 01/21/20 @ 2pm

## 2020-01-18 LAB — CUP PACEART REMOTE DEVICE CHECK
Date Time Interrogation Session: 20210418012558
Implantable Pulse Generator Implant Date: 20181022

## 2020-01-19 ENCOUNTER — Ambulatory Visit (INDEPENDENT_AMBULATORY_CARE_PROVIDER_SITE_OTHER): Payer: Medicare PPO | Admitting: *Deleted

## 2020-01-19 ENCOUNTER — Telehealth: Payer: Self-pay

## 2020-01-19 DIAGNOSIS — I639 Cerebral infarction, unspecified: Secondary | ICD-10-CM | POA: Diagnosis not present

## 2020-01-19 NOTE — Telephone Encounter (Signed)
No answer no voice mail  

## 2020-01-20 DIAGNOSIS — M5441 Lumbago with sciatica, right side: Secondary | ICD-10-CM | POA: Diagnosis not present

## 2020-01-20 DIAGNOSIS — M961 Postlaminectomy syndrome, not elsewhere classified: Secondary | ICD-10-CM | POA: Diagnosis not present

## 2020-01-20 DIAGNOSIS — M5442 Lumbago with sciatica, left side: Secondary | ICD-10-CM | POA: Diagnosis not present

## 2020-01-20 DIAGNOSIS — G8929 Other chronic pain: Secondary | ICD-10-CM | POA: Diagnosis not present

## 2020-01-20 NOTE — Progress Notes (Signed)
ILR Remote 

## 2020-01-21 ENCOUNTER — Other Ambulatory Visit: Payer: Self-pay

## 2020-01-21 ENCOUNTER — Other Ambulatory Visit: Payer: Medicare PPO | Admitting: Hospice

## 2020-01-21 DIAGNOSIS — Z515 Encounter for palliative care: Secondary | ICD-10-CM | POA: Diagnosis not present

## 2020-01-21 DIAGNOSIS — J441 Chronic obstructive pulmonary disease with (acute) exacerbation: Secondary | ICD-10-CM

## 2020-01-21 NOTE — Progress Notes (Signed)
[facility] Palliative Care Consult Note Telephone: [phone]  Fax: [phone]  PATIENT NAME: Melanie Mcdonald DOB: Apr 22, 1933 MRN: [id number]  PRIMARY CARE PROVIDER:   Laurey Morale, MD  REFERRING PROVIDER:  Laurey Morale, MD [place],  [place] [address]  RESPONSIBLE PARTY:   Self [phone] Contact: Melanie Mcdonald, lives at home with patient   TELEHEALTH VISIT STATEMENT Due to the COVID-19 crisis, this visit was done via telephone from my office. It was initiated and consented to Melanie Mcdonald this patient and/or family.  RECOMMENDATIONS/PLAN:   Advance Care Planning/Goals of Care: Telehealth Visit at the request of Dr. Alysia Mcdonald for palliative consult. Visit consisted of building trust and discussions on Palliative Medicine as specialized medical care for people living with serious illness, aimed at facilitating better quality of life through symptoms relief, assisting with advance care plan and establishing goals of care. Patient said she does not want chest compressions if her heart stops beating and she stops breathing. She affirmed she is DNR. She said she has DNR at home and will look for it to keep it where it is visible. Goals of care include to maximize quality of life and symptom management. 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  Symptom management: Patient fell again last week and hurt her left shoulder again. She manages her pain with Tylenol extra strength and rarely uses her oxycodone. She explained her Orthopedic doctor said surgery is not an option and has  ordered PT; She is expecting PT to call and start soon. She said her Ortho gave her a cortison shot a while ago and she said it makes her sugar to go up. She is on Insulin for Type 2 DM. She checks her blood sugar at home and the readings are not concerning. Chart review shows last A1c 11/08/2019 was 8.7.   Discussed fall precautions; she is agreeable to use her  rolling walker for support to help prevent a fall.  Patient on diuretic for CHF, denies acute edema, difficulty breathing. She report her last COPD exacerbation was two months ago when she was hospitalized and treated with steroids and bronchodilators. She said no flare at this time; she is not on oxygen at this time.  Follow up: Palliative care will continue to follow patient for goals of care clarification and symptom management. I spent one hour and 20 minutes providing this consultation; time includes chart review and documentation. More than 50% of the time in this consultation was spent on coordinating communication  HISTORY OF PRESENT ILLNESS:  Melanie Mcdonald is a 86 y.o. year old female with multiple medical problems including COPD, coronaryarterydisease, chronic diastolic CHF, chronic back pain, Type 2 DM. Palliative Care was asked to help address goals of care.   CODE STATUS: DNR  PPS: 50% HOSPICE ELIGIBILITY/DIAGNOSIS: TBD  PAST MEDICAL HISTORY:  Past Medical History:  Diagnosis Date  . Abdominal pain, unspecified site 03/09/2014  . Acute on chronic diastolic CHF (congestive heart failure), NYHA class 3 (Melanie Mcdonald) 11/03/2014   Acute on chronic diastolic heart failure  . Acute respiratory failure (Melanie Mcdonald) 06/17/2018  . ANEMIA 01/30/2008  . ARTHRITIS 12/11/2007  . BACK PAIN, CHRONIC 06/23/2008  . Backache 06/23/2008   Qualifier: Diagnosis of  Melanie Mcdonald: Melanie Hummer MD, Melanie Mcdonald CYST, RIGHT 12/17/2007  . Cancer ([place])   . Candidiasis of genitalia in female 07/20/2017  . Cellulitis of left leg 08/14/2011  .  Cellulitis of right foot 06/13/2016  . Cervicogenic headache 06/14/2015  . Chest pain 06/08/2011   Atypical chest pain  . Chest pain 07/20/2017  . CHF (congestive heart failure) ([place])   . CHF (congestive heart failure), NYHA class II, chronic, systolic ([place]) 01/06/8294  . Colon polyps    FRAGMENTS OF HYPERPLASTIC POLYP  . CONTACT DERMATITIS 03/10/2009  . COPD (chronic obstructive pulmonary  disease) ([place])   . CORONARY ARTERY DISEASE 03/01/2007   had a normal Myoview stress test 07-13-11  . Coronary atherosclerosis 03/01/2007   Qualifier: Diagnosis of  Melanie Mcdonald: Melanie Razor RN, Melanie Mcdonald   . CVA (cerebral vascular accident) (Melanie Mcdonald) 07/20/2017  . CYSTOCELE WITHOUT MENTION UTERINE PROLAPSE LAT 09/12/2010  . DEGENERATIVE JOINT DISEASE 08/07/2007  . DEPRESSION 03/01/2007  . Depression, recurrent ([place]) 03/01/2007   Qualifier: Diagnosis of  Melanie Mcdonald: Melanie Razor RN, Melanie Mcdonald   . DIABETES MELLITUS, TYPE II 08/26/2008  . Diabetic polyneuropathy associated with diabetes mellitus due to underlying condition ([place]) 06/14/2015  . Diarrhea 08/15/2011  . Diverticulosis of colon (without mention of hemorrhage)   . Dizziness 02/10/2014  . DOE (dyspnea on exertion) 11/03/2014  . Edema 09/02/2008   Centricity Description: EDEMA Qualifier: Diagnosis of  Melanie Mcdonald: Melanie Hummer MD, Melanie Mcdonald  Centricity Description: PERIPHERAL EDEMA Qualifier: Diagnosis of  Melanie Mcdonald: Melanie Hummer MD, Melanie Mcdonald   . Esophageal reflux 11/06/2007  . Essential hypertension 03/01/2007   Qualifier: Diagnosis of  Melanie Mcdonald: Melanie Razor RN, Melanie Mcdonald   . Gait instability 11/13/2014  . Gout 06/07/2016  . Headache 12/30/2013  . HIP PAIN 09/15/2010   Qualifier: Diagnosis of  Melanie Mcdonald: Melanie Hashimoto MD, Melanie Mcdonald    . HYPERKERATOSIS 06/02/2009  . HYPERLIPIDEMIA 08/26/2008  . Hyperlipidemia 08/26/2008   Qualifier: Diagnosis of  Melanie Mcdonald: Melanie Razor RN, Melanie Mcdonald HYPERTENSION 03/01/2007  . Hypokalemia 08/15/2011  . Insomnia 12/11/2007  . Irritable bowel syndrome 01/26/2009  . LABYRINTHITIS 11/03/2009  . Labyrinthitis 11/03/2009   Qualifier: Diagnosis of  Melanie Mcdonald: Melanie Hummer MD, Melanie Mcdonald   . Leg swelling 04/05/2016  . Memory loss 12/30/2013  . Narcolepsy without cataplexy 08/26/2008   Qualifier: Diagnosis of  Melanie Mcdonald: Melanie Hummer MD, Melanie Mcdonald Narcolepsy without cataplexy(347.00) 08/26/2008  . NEPHROLITHIASIS 04/29/2009  . OBESITY 03/01/2007  . Obstructive apnea 11/03/2014  . Osteoarthritis 03/15/2018  . Pain in joint, ankle  and foot 12/24/2015  . PERIPHERAL VASCULAR DISEASE 01/26/2009  . Peripheral vascular disease ([place]) 01/26/2009   Qualifier: Diagnosis of  Melanie Mcdonald: Melanie Hummer MD, [place] history of colonic polyps 03/09/2014  . Stroke ([place])   . SYNCOPE 08/26/2008   had brain MRI on 06-28-12 showing only chronic microvascular ischemia and atrophy   . SYNDROME, RESTLESS LEGS 03/01/2007   Qualifier: Diagnosis of  Melanie Mcdonald: Melanie Razor RN, Melanie Mcdonald Thyroid nodule 04/28/2014  . TIA (transient ischemic attack) 11/13/2014  . TRANSIENT ISCHEMIC ATTACK 10/20/2009   had normal brain MRA with patent vertebrals and carotids 06-28-12  . Uncontrolled type 2 diabetes mellitus with hyperglycemia, with long-term current use of insulin ([place]) 10/09/2016  . Weakness generalized 11/13/2014    SOCIAL HX:  Social History   Tobacco Use  . Smoking status: Never Smoker  . Smokeless tobacco: Never Used  Substance Use Topics  . Alcohol use: No    Alcohol/week: 0.0 standard drinks    ALLERGIES:  Allergies  Allergen Reactions  . Lyrica [Pregabalin] Other (See Comments)    Suicidal   . Aspirin Other (  See Comments)    Abdominal pain  . Ciprofloxacin Nausea And Vomiting    Made pt very sick on stomach  . Clarithromycin Other (See Comments)    Made stomach "burn"  . Doxycycline Itching  . Erythromycin Nausea And Vomiting  . Lisinopril Cough  . Macrobid [Nitrofurantoin Monohyd Macro] Nausea And Vomiting  . Metolazone Other (See Comments)    Pt stated this made her B/P drop  . Other Nausea And Vomiting    Pt cannot have any of the -mycin(s)  . Penicillins Swelling    From childhood: swelling @ injection site,childhood allergy Has patient had a PCN reaction causing immediate rash, facial/tongue/throat swelling, SOB or lightheadedness with hypotension: Yes Has patient had a PCN reaction causing severe rash involving mucus membranes or skin necrosis: No Has patient had a PCN reaction that required hospitalization: No Has patient had  a PCN reaction occurring within the last 10 years: No If all of the above answers are "NO", then may proceed with Cephalosporin use.  . Sulfonamide Derivatives Nausea And Vomiting  . Valtrex [Valacyclovir Hcl] Nausea Only     PERTINENT MEDICATIONS:  Outpatient Encounter Medications as of 01/21/2020  Medication Sig  . albuterol (VENTOLIN HFA) 108 (90 Base) MCG/ACT inhaler Inhale 2 puffs into the lungs every 6 (six) hours as needed for wheezing or shortness of breath.  . blood glucose meter kit and supplies KIT Use as directed.  . clopidogrel (PLAVIX) 75 MG tablet TAKE 1 TABLET Melanie Mcdonald MOUTH EVERY DAY (Patient taking differently: Take 75 mg Melanie Mcdonald mouth daily. )  . Continuous Blood Gluc Receiver (FREESTYLE LIBRE 14 DAY READER) DEVI 1 Device Melanie Mcdonald Does not apply route daily. Use device daily as directed  . Continuous Blood Gluc Sensor (FREESTYLE LIBRE 14 DAY SENSOR) MISC 1 application Melanie Mcdonald Does not apply route every 14 (fourteen) days.  . diclofenac Sodium (VOLTAREN) 1 % GEL Apply 2 g topically 4 (four) times daily.  . fluconazole (DIFLUCAN) 100 MG tablet Take 100 mg Melanie Mcdonald mouth daily.  . furosemide (LASIX) 40 MG tablet Take 2 tablets (80 mg total) Melanie Mcdonald mouth 2 (two) times daily. TAKE 2 TABLETS Melanie Mcdonald MOUTH TWICE DAILY  . glucose blood (ONE TOUCH ULTRA TEST) test strip 1 each Melanie Mcdonald Other route as needed for other. Use as instructed  . HUMALOG KWIKPEN 100 UNIT/ML KwikPen Inject 0.2 mLs (20 Units total) into the skin 3 (three) times daily before meals.  Marland Kitchen HYDROcodone-homatropine (HYDROMET) 5-1.5 MG/5ML syrup Take 5 mLs Melanie Mcdonald mouth every 4 (four) hours as needed.  . insulin degludec (TRESIBA FLEXTOUCH) 100 UNIT/ML FlexTouch Pen INJECT 80 UNITS INTO THE SKIN EVERY MORNING  . Insulin Pen Needle (NOVOFINE) 32G X 6 MM MISC CHECK BLOOD GLUCOSE THREE TIMES DAILY (Patient taking differently: 1 each Melanie Mcdonald Other route 3 (three) times daily. CHECK BLOOD GLUCOSE THREE TIMES DAILY)  . meclizine (ANTIVERT) 25 MG tablet Take 1 tablet (25 mg total)  Melanie Mcdonald mouth 3 (three) times daily as needed for dizziness.  . mupirocin ointment (BACTROBAN) 2 % Apply 1 application topically 2 (two) times daily.   . nitroGLYCERIN (NITROSTAT) 0.4 MG SL tablet Place 0.4 mg under the tongue every 5 (five) minutes as needed for chest pain.  Derrill Memo ON 02/23/2020] oxyCODONE-acetaminophen (PERCOCET) 10-325 MG tablet Take 1 tablet Melanie Mcdonald mouth every 6 (six) hours as needed for pain.  . phenazopyridine (PYRIDIUM) 200 MG tablet Take 1 tablet (200 mg total) Melanie Mcdonald mouth 3 (three) times daily as needed (urinary pain).  . pramipexole (MIRAPEX) 1  MG tablet TAKE 4 TABLETS Melanie Mcdonald MOUTH ONCE DAILY (Patient taking differently: Take 4 mg Melanie Mcdonald mouth at bedtime. )  . spironolactone (ALDACTONE) 25 MG tablet TAKE 1 TABLET(25 MG) Melanie Mcdonald MOUTH TWICE DAILY  . zolpidem (AMBIEN) 10 MG tablet TAKE 1 TABLET(10 MG) Melanie Mcdonald MOUTH AT BEDTIME AS NEEDED FOR SLEEP   No facility-administered encounter medications on file as of 01/21/2020.   Teodoro Spray, NP

## 2020-01-22 DIAGNOSIS — J449 Chronic obstructive pulmonary disease, unspecified: Secondary | ICD-10-CM | POA: Diagnosis not present

## 2020-01-22 DIAGNOSIS — G8929 Other chronic pain: Secondary | ICD-10-CM | POA: Diagnosis not present

## 2020-01-22 DIAGNOSIS — M75101 Unspecified rotator cuff tear or rupture of right shoulder, not specified as traumatic: Secondary | ICD-10-CM | POA: Diagnosis not present

## 2020-01-22 DIAGNOSIS — M5136 Other intervertebral disc degeneration, lumbar region: Secondary | ICD-10-CM | POA: Diagnosis not present

## 2020-01-22 DIAGNOSIS — M48061 Spinal stenosis, lumbar region without neurogenic claudication: Secondary | ICD-10-CM | POA: Diagnosis not present

## 2020-01-22 DIAGNOSIS — M810 Age-related osteoporosis without current pathological fracture: Secondary | ICD-10-CM | POA: Diagnosis not present

## 2020-01-22 DIAGNOSIS — E119 Type 2 diabetes mellitus without complications: Secondary | ICD-10-CM | POA: Diagnosis not present

## 2020-01-22 DIAGNOSIS — I2510 Atherosclerotic heart disease of native coronary artery without angina pectoris: Secondary | ICD-10-CM | POA: Diagnosis not present

## 2020-01-22 DIAGNOSIS — M170 Bilateral primary osteoarthritis of knee: Secondary | ICD-10-CM | POA: Diagnosis not present

## 2020-01-27 DIAGNOSIS — E119 Type 2 diabetes mellitus without complications: Secondary | ICD-10-CM | POA: Diagnosis not present

## 2020-01-27 DIAGNOSIS — M48061 Spinal stenosis, lumbar region without neurogenic claudication: Secondary | ICD-10-CM | POA: Diagnosis not present

## 2020-01-27 DIAGNOSIS — J449 Chronic obstructive pulmonary disease, unspecified: Secondary | ICD-10-CM | POA: Diagnosis not present

## 2020-01-27 DIAGNOSIS — G8929 Other chronic pain: Secondary | ICD-10-CM | POA: Diagnosis not present

## 2020-01-27 DIAGNOSIS — M170 Bilateral primary osteoarthritis of knee: Secondary | ICD-10-CM | POA: Diagnosis not present

## 2020-01-27 DIAGNOSIS — M810 Age-related osteoporosis without current pathological fracture: Secondary | ICD-10-CM | POA: Diagnosis not present

## 2020-01-27 DIAGNOSIS — I2510 Atherosclerotic heart disease of native coronary artery without angina pectoris: Secondary | ICD-10-CM | POA: Diagnosis not present

## 2020-01-27 DIAGNOSIS — M5136 Other intervertebral disc degeneration, lumbar region: Secondary | ICD-10-CM | POA: Diagnosis not present

## 2020-01-27 DIAGNOSIS — M75101 Unspecified rotator cuff tear or rupture of right shoulder, not specified as traumatic: Secondary | ICD-10-CM | POA: Diagnosis not present

## 2020-01-28 DIAGNOSIS — N3941 Urge incontinence: Secondary | ICD-10-CM | POA: Diagnosis not present

## 2020-01-28 DIAGNOSIS — N8111 Cystocele, midline: Secondary | ICD-10-CM | POA: Diagnosis not present

## 2020-01-30 ENCOUNTER — Telehealth: Payer: Self-pay | Admitting: *Deleted

## 2020-01-30 DIAGNOSIS — M810 Age-related osteoporosis without current pathological fracture: Secondary | ICD-10-CM | POA: Diagnosis not present

## 2020-01-30 DIAGNOSIS — J449 Chronic obstructive pulmonary disease, unspecified: Secondary | ICD-10-CM | POA: Diagnosis not present

## 2020-01-30 DIAGNOSIS — G8929 Other chronic pain: Secondary | ICD-10-CM | POA: Diagnosis not present

## 2020-01-30 DIAGNOSIS — E119 Type 2 diabetes mellitus without complications: Secondary | ICD-10-CM | POA: Diagnosis not present

## 2020-01-30 DIAGNOSIS — M48061 Spinal stenosis, lumbar region without neurogenic claudication: Secondary | ICD-10-CM | POA: Diagnosis not present

## 2020-01-30 DIAGNOSIS — M5136 Other intervertebral disc degeneration, lumbar region: Secondary | ICD-10-CM | POA: Diagnosis not present

## 2020-01-30 DIAGNOSIS — M75101 Unspecified rotator cuff tear or rupture of right shoulder, not specified as traumatic: Secondary | ICD-10-CM | POA: Diagnosis not present

## 2020-01-30 DIAGNOSIS — I2510 Atherosclerotic heart disease of native coronary artery without angina pectoris: Secondary | ICD-10-CM | POA: Diagnosis not present

## 2020-01-30 DIAGNOSIS — M170 Bilateral primary osteoarthritis of knee: Secondary | ICD-10-CM | POA: Diagnosis not present

## 2020-01-30 MED ORDER — PRAMIPEXOLE DIHYDROCHLORIDE 1 MG PO TABS: ORAL_TABLET | ORAL | 1 refills | Status: DC

## 2020-01-30 NOTE — Telephone Encounter (Signed)
I have not received a fax from the pharmacy. What medication is needed? Left message for patient to call back.

## 2020-01-30 NOTE — Telephone Encounter (Signed)
Patient called the after hours line. Patient reports she need her medication refilled. She said Dr. Sarajane Jews has not responded to the fax sent over from the pharmacy.

## 2020-01-30 NOTE — Telephone Encounter (Signed)
Pt called in to see if prescription had been sent to the pharmacy yet. Pt is requesting a refill for pramipexole.

## 2020-01-30 NOTE — Telephone Encounter (Signed)
Rx has been sent in. Patient is aware. 

## 2020-02-02 ENCOUNTER — Telehealth: Payer: Self-pay | Admitting: Unknown Physician Specialty

## 2020-02-02 NOTE — Telephone Encounter (Signed)
I connected by phone with Enriqueta Shutter and/or patient's caregiver on 02/02/2020 at 4:52 PM to discuss the potential vaccination through our Homebound vaccination initiative.   She has had a mRNA vaccine at the drug store.    Kathrine Haddock 02/02/2020 4:52 PM

## 2020-02-03 DIAGNOSIS — M5136 Other intervertebral disc degeneration, lumbar region: Secondary | ICD-10-CM | POA: Diagnosis not present

## 2020-02-03 DIAGNOSIS — M810 Age-related osteoporosis without current pathological fracture: Secondary | ICD-10-CM | POA: Diagnosis not present

## 2020-02-03 DIAGNOSIS — M48061 Spinal stenosis, lumbar region without neurogenic claudication: Secondary | ICD-10-CM | POA: Diagnosis not present

## 2020-02-03 DIAGNOSIS — M75101 Unspecified rotator cuff tear or rupture of right shoulder, not specified as traumatic: Secondary | ICD-10-CM | POA: Diagnosis not present

## 2020-02-03 DIAGNOSIS — G8929 Other chronic pain: Secondary | ICD-10-CM | POA: Diagnosis not present

## 2020-02-03 DIAGNOSIS — E119 Type 2 diabetes mellitus without complications: Secondary | ICD-10-CM | POA: Diagnosis not present

## 2020-02-03 DIAGNOSIS — I2510 Atherosclerotic heart disease of native coronary artery without angina pectoris: Secondary | ICD-10-CM | POA: Diagnosis not present

## 2020-02-03 DIAGNOSIS — M170 Bilateral primary osteoarthritis of knee: Secondary | ICD-10-CM | POA: Diagnosis not present

## 2020-02-03 DIAGNOSIS — J449 Chronic obstructive pulmonary disease, unspecified: Secondary | ICD-10-CM | POA: Diagnosis not present

## 2020-02-04 ENCOUNTER — Other Ambulatory Visit: Payer: Self-pay

## 2020-02-04 ENCOUNTER — Other Ambulatory Visit: Payer: Medicare PPO | Admitting: Hospice

## 2020-02-04 DIAGNOSIS — Z515 Encounter for palliative care: Secondary | ICD-10-CM | POA: Diagnosis not present

## 2020-02-04 DIAGNOSIS — J441 Chronic obstructive pulmonary disease with (acute) exacerbation: Secondary | ICD-10-CM

## 2020-02-04 NOTE — Progress Notes (Signed)
[facility] Palliative Care Consult Note Telephone: [phone]  Fax: [phone]  PATIENT NAME: Melanie Mcdonald DOB: 1933-01-18 MRN: [id number]  PRIMARY CARE PROVIDER:   Laurey Morale, MD  REFERRING PROVIDER: Alysia Penna MD  RESPONSIBLE PARTY:   Self [phone] Contact: Donald Siva, lives at home with patient     RECOMMENDATIONS/PLAN:   Advance Care Planning/Goals of Care: Marland Kitchen Visit consisted of building trust and follow-up on palliative care.  Discussions on goals of care clarification and CODE STATUS.  Patient affirmed she is a DNR; she is not able to see her signed DNR at home.  NP signed a DNR form for her today and uploaded sent to epic. Goals of care include to maximize quality of life and symptom management. 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  She also makes masks for some church members.  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  Therapeutic presence, therapeutic listening and validation provided. Symptom management: Patient with no report of fall since last visit.  She had following a few weeks ago and hurt her left shoulder.  She also has chronic low back pain of which she used to take cortisol shot before.  She manages her pain with Tylenol extra strength and rarely uses her oxycodone. She explained her Orthopedic doctor said surgery is not an option and has  ordered PT; PT has started 2 times a week.  She is on Insulin for Type 2 DM. She checks her blood sugar at home and the readings are not concerning. Chart review shows last A1c 11/08/2019 was 8.7.   Discussed fall precautions; she is agreeable to use her rolling walker for support to help prevent a fall. She was using her rolling walker during visit.  Patient on diuretic for CHF twice daily.   She said she has not taken her Lasix today.  Education provided on the need to take it to prevent fluid over load.  She was advised to take it in the morning and late afternoon as that by PCP.  She verbalized understanding and stated she will.  She denies difficulty breathing; no recent COPD exacerbation. She is not on oxygen at this time. She has telehealth appointment with PCP this Friday.  Follow up: Palliative care will continue to follow patient for goals of care clarification and symptom management. I spent one hour and  20 minutes providing this consultation; time includes chart review and documentation. More than 50% of the time in this consultation was spent on coordinating communication  HISTORY OF PRESENT ILLNESS:  Melanie Mcdonald is a 86 y.o. year old female with multiple medical problems including COPD, coronaryarterydisease, chronic diastolic CHF, chronic back pain, Type 2 DM. Palliative Care was asked to help address goals of care.   CODE STATUS: DNR  PPS: 50% HOSPICE ELIGIBILITY/DIAGNOSIS: TBD  PAST MEDICAL HISTORY:  Past Medical History:  Diagnosis Date   Abdominal pain, unspecified site 03/09/2014   Acute on chronic diastolic CHF (congestive heart failure), NYHA class 3 ([place]) 11/03/2014   Acute on chronic diastolic heart failure   Acute respiratory failure ([place]) 06/17/2018   ANEMIA 01/30/2008   ARTHRITIS 12/11/2007   BACK PAIN, CHRONIC 06/23/2008   Backache 06/23/2008   Qualifier: Diagnosis of  By: Niel Hummer MD,  Willie R    BREAST CYST, RIGHT 12/17/2007   Cancer ([place])    Candidiasis of genitalia in female 07/20/2017   Cellulitis of left leg 08/14/2011   Cellulitis of right foot 06/13/2016   Cervicogenic headache 06/14/2015   Chest pain 06/08/2011   Atypical chest pain   Chest pain 07/20/2017   CHF (congestive heart failure) ([facility])    CHF (congestive heart failure), NYHA class II, chronic, systolic ([place]) 0/12/5463   Colon polyps    FRAGMENTS OF HYPERPLASTIC  POLYP   CONTACT DERMATITIS 03/10/2009   COPD (chronic obstructive pulmonary disease) ([place])    CORONARY ARTERY DISEASE 03/01/2007   had a normal Myoview stress test 07-13-11   Coronary atherosclerosis 03/01/2007   Qualifier: Diagnosis of  By: Cori Razor RN, Mikal Plane    CVA (cerebral vascular accident) ([place]) 07/20/2017   CYSTOCELE WITHOUT MENTION UTERINE PROLAPSE LAT 09/12/2010   DEGENERATIVE JOINT DISEASE 08/07/2007   DEPRESSION 03/01/2007   Depression, recurrent ([place]) 03/01/2007   Qualifier: Diagnosis of  By: Cori Razor RN, Regina G    DIABETES MELLITUS, TYPE II 08/26/2008   Diabetic polyneuropathy associated with diabetes mellitus due to underlying condition ([place]) 06/14/2015   Diarrhea 08/15/2011   Diverticulosis of colon (without mention of hemorrhage)    Dizziness 02/10/2014   DOE (dyspnea on exertion) 11/03/2014   Edema 09/02/2008   Centricity Description: EDEMA Qualifier: Diagnosis of  By: Niel Hummer MD, Lorinda Creed  Centricity Description: PERIPHERAL EDEMA Qualifier: Diagnosis of  By: Niel Hummer MD, [place] R    Esophageal reflux 11/06/2007   Essential hypertension 03/01/2007   Qualifier: Diagnosis of  By: Aurther Loft    Gait instability 11/13/2014   Gout 06/07/2016   Headache 12/30/2013   HIP PAIN 09/15/2010   Qualifier: Diagnosis of  By: Elease Hashimoto MD, Bruce     HYPERKERATOSIS 06/02/2009   HYPERLIPIDEMIA 08/26/2008   Hyperlipidemia 08/26/2008   Qualifier: Diagnosis of  By: Alfonso Ellis, Regina G    HYPERTENSION 03/01/2007   Hypokalemia 08/15/2011   Insomnia 12/11/2007   Irritable bowel syndrome 01/26/2009   LABYRINTHITIS 11/03/2009   Labyrinthitis 11/03/2009   Qualifier: Diagnosis of  By: Niel Hummer MD, Willie R    Leg swelling 04/05/2016   Memory loss 12/30/2013   Narcolepsy without cataplexy 08/26/2008   Qualifier: Diagnosis of  By: Niel Hummer MD, Willie R    Narcolepsy without cataplexy(347.00) 08/26/2008   NEPHROLITHIASIS 04/29/2009   OBESITY 03/01/2007    Obstructive apnea 11/03/2014   Osteoarthritis 03/15/2018   Pain in joint, ankle and foot 12/24/2015   PERIPHERAL VASCULAR DISEASE 01/26/2009   Peripheral vascular disease ([place]) 01/26/2009   Qualifier: Diagnosis of  By: Niel Hummer MD, Lorinda Creed    Personal history of colonic polyps 03/09/2014   Stroke ([place])    SYNCOPE 08/26/2008   had brain MRI on 06-28-12 showing only chronic microvascular ischemia and atrophy    SYNDROME, RESTLESS LEGS 03/01/2007   Qualifier: Diagnosis of  By: Cori Razor RN, Mikal Plane    Thyroid nodule 04/28/2014   TIA (transient ischemic attack) 11/13/2014   TRANSIENT ISCHEMIC ATTACK 10/20/2009   had normal brain MRA with patent vertebrals and carotids 06-28-12   Uncontrolled type 2 diabetes mellitus with hyperglycemia, with long-term current use of insulin ([place]) 10/09/2016   Weakness generalized 11/13/2014    SOCIAL HX:  Social History   Tobacco Use   Smoking status: Never Smoker   Smokeless tobacco: Never Used  Substance Use Topics   Alcohol use: No  Alcohol/week: 0.0 standard drinks    ALLERGIES:  Allergies  Allergen Reactions   Lyrica [Pregabalin] Other (See Comments)    Suicidal    Aspirin Other (See Comments)    Abdominal pain   Ciprofloxacin Nausea And Vomiting    Made pt very sick on stomach   Clarithromycin Other (See Comments)    Made stomach "burn"   Doxycycline Itching   Erythromycin Nausea And Vomiting   Lisinopril Cough   Macrobid [Nitrofurantoin Monohyd Macro] Nausea And Vomiting   Metolazone Other (See Comments)    Pt stated this made her B/P drop   Other Nausea And Vomiting    Pt cannot have any of the -mycin(s)   Penicillins Swelling    From childhood: swelling @ injection site,childhood allergy Has patient had a PCN reaction causing immediate rash, facial/tongue/throat swelling, SOB or lightheadedness with hypotension: Yes Has patient had a PCN reaction causing severe rash involving mucus membranes or skin necrosis:  No Has patient had a PCN reaction that required hospitalization: No Has patient had a PCN reaction occurring within the last 10 years: No If all of the above answers are "NO", then may proceed with Cephalosporin use.   Sulfonamide Derivatives Nausea And Vomiting   Valtrex [Valacyclovir Hcl] Nausea Only     PERTINENT MEDICATIONS:  Outpatient Encounter Medications as of 02/04/2020  Medication Sig   albuterol (VENTOLIN HFA) 108 (90 Base) MCG/ACT inhaler Inhale 2 puffs into the lungs every 6 (six) hours as needed for wheezing or shortness of breath.   blood glucose meter kit and supplies KIT Use as directed.   clopidogrel (PLAVIX) 75 MG tablet TAKE 1 TABLET BY MOUTH EVERY DAY (Patient taking differently: Take 75 mg by mouth daily. )   Continuous Blood Gluc Receiver (FREESTYLE LIBRE 14 DAY READER) DEVI 1 Device by Does not apply route daily. Use device daily as directed   Continuous Blood Gluc Sensor (FREESTYLE LIBRE 14 DAY SENSOR) MISC 1 application by Does not apply route every 14 (fourteen) days.   diclofenac Sodium (VOLTAREN) 1 % GEL Apply 2 g topically 4 (four) times daily.   fluconazole (DIFLUCAN) 100 MG tablet Take 100 mg by mouth daily.   furosemide (LASIX) 40 MG tablet Take 2 tablets (80 mg total) by mouth 2 (two) times daily. TAKE 2 TABLETS BY MOUTH TWICE DAILY   glucose blood (ONE TOUCH ULTRA TEST) test strip 1 each by Other route as needed for other. Use as instructed   HUMALOG KWIKPEN 100 UNIT/ML KwikPen Inject 0.2 mLs (20 Units total) into the skin 3 (three) times daily before meals.   HYDROcodone-homatropine (HYDROMET) 5-1.5 MG/5ML syrup Take 5 mLs by mouth every 4 (four) hours as needed.   insulin degludec (TRESIBA FLEXTOUCH) 100 UNIT/ML FlexTouch Pen INJECT 80 UNITS INTO THE SKIN EVERY MORNING   Insulin Pen Needle (NOVOFINE) 32G X 6 MM MISC CHECK BLOOD GLUCOSE THREE TIMES DAILY (Patient taking differently: 1 each by Other route 3 (three) times daily. CHECK BLOOD GLUCOSE  THREE TIMES DAILY)   meclizine (ANTIVERT) 25 MG tablet Take 1 tablet (25 mg total) by mouth 3 (three) times daily as needed for dizziness.   mupirocin ointment (BACTROBAN) 2 % Apply 1 application topically 2 (two) times daily.    nitroGLYCERIN (NITROSTAT) 0.4 MG SL tablet Place 0.4 mg under the tongue every 5 (five) minutes as needed for chest pain.   [START ON 02/23/2020] oxyCODONE-acetaminophen (PERCOCET) 10-325 MG tablet Take 1 tablet by mouth every 6 (six) hours as needed for  pain.   phenazopyridine (PYRIDIUM) 200 MG tablet Take 1 tablet (200 mg total) by mouth 3 (three) times daily as needed (urinary pain).   pramipexole (MIRAPEX) 1 MG tablet TAKE 4 TABLETS BY MOUTH ONCE DAILY   spironolactone (ALDACTONE) 25 MG tablet TAKE 1 TABLET(25 MG) BY MOUTH TWICE DAILY   zolpidem (AMBIEN) 10 MG tablet TAKE 1 TABLET(10 MG) BY MOUTH AT BEDTIME AS NEEDED FOR SLEEP   No facility-administered encounter medications on file as of 02/04/2020.    PHYSICAL EXAM/ROS:   General: NAD, cooperative Cardiovascular: regular rate and rhythm; denies chest pain Pulmonary: clear ant fields; normal respiratory effort Abdomen: soft, nontender, + bowel sounds GU: no suprapubic tenderness Extremities: Nonpitting edema to BLE, discoloration, loss of hair to BLE Skin: no rashes to exposed skin Neurological: Weakness but otherwise nonfocal  Teodoro Spray, NP

## 2020-02-05 DIAGNOSIS — M75101 Unspecified rotator cuff tear or rupture of right shoulder, not specified as traumatic: Secondary | ICD-10-CM | POA: Diagnosis not present

## 2020-02-05 DIAGNOSIS — G8929 Other chronic pain: Secondary | ICD-10-CM | POA: Diagnosis not present

## 2020-02-05 DIAGNOSIS — E119 Type 2 diabetes mellitus without complications: Secondary | ICD-10-CM | POA: Diagnosis not present

## 2020-02-05 DIAGNOSIS — M48061 Spinal stenosis, lumbar region without neurogenic claudication: Secondary | ICD-10-CM | POA: Diagnosis not present

## 2020-02-05 DIAGNOSIS — M810 Age-related osteoporosis without current pathological fracture: Secondary | ICD-10-CM | POA: Diagnosis not present

## 2020-02-05 DIAGNOSIS — M170 Bilateral primary osteoarthritis of knee: Secondary | ICD-10-CM | POA: Diagnosis not present

## 2020-02-05 DIAGNOSIS — M5136 Other intervertebral disc degeneration, lumbar region: Secondary | ICD-10-CM | POA: Diagnosis not present

## 2020-02-05 DIAGNOSIS — J449 Chronic obstructive pulmonary disease, unspecified: Secondary | ICD-10-CM | POA: Diagnosis not present

## 2020-02-05 DIAGNOSIS — I2510 Atherosclerotic heart disease of native coronary artery without angina pectoris: Secondary | ICD-10-CM | POA: Diagnosis not present

## 2020-02-06 ENCOUNTER — Encounter: Payer: Self-pay | Admitting: Family Medicine

## 2020-02-06 ENCOUNTER — Other Ambulatory Visit: Payer: Self-pay

## 2020-02-06 ENCOUNTER — Telehealth (INDEPENDENT_AMBULATORY_CARE_PROVIDER_SITE_OTHER): Payer: Medicare PPO | Admitting: Family Medicine

## 2020-02-06 DIAGNOSIS — M109 Gout, unspecified: Secondary | ICD-10-CM | POA: Diagnosis not present

## 2020-02-06 MED ORDER — METHYLPREDNISOLONE 4 MG PO TBPK: ORAL_TABLET | ORAL | 0 refills | Status: DC

## 2020-02-06 NOTE — Progress Notes (Signed)
   Subjective:    Patient ID: Melanie Mcdonald, female    DOB: Apr 20, 1933, 86 y.o.   MRN: [id number]  HPI Virtual Visit via Telephone Note  I connected with the patient on 02/06/20 at 11:30 AM EDT by telephone and verified that I am speaking with the correct person using two identifiers.   I discussed the limitations, risks, security and privacy concerns of performing an evaluation and management service by telephone and the availability of in person appointments. I also discussed with the patient that there may be a patient responsible charge related to this service. The patient expressed understanding and agreed to proceed.  Location patient: home Location provider: work or home office Participants present for the call: patient, provider Patient did not have a visit in the prior 7 days to address this/these issue(s).   History of Present Illness: She has had swelling and pain in the right great toe for 2 days. No recent trauma.    Observations/Objective: Patient sounds cheerful and well on the phone. I do not appreciate any SOB. Speech and thought processing are grossly intact. Patient reported vitals:  Assessment and Plan: This is likely due to gout. Treat with a Medrol dose pack.  Alysia Penna, MD   Follow Up Instructions:     [phone] 5-10 [phone] 11-20 [id number] 21-30 I did not refer this patient for an OV in the next 24 hours for this/these issue(s).  I discussed the assessment and treatment plan with the patient. The patient was provided an opportunity to ask questions and all were answered. The patient agreed with the plan and demonstrated an understanding of the instructions.   The patient was advised to call back or seek an in-person evaluation if the symptoms worsen or if the condition fails to improve as anticipated.  I provided 10 minutes of non-face-to-face time during this encounter.   Alysia Penna, MD    Review of Systems     Objective:   Physical  Exam        Assessment & Plan:

## 2020-02-09 DIAGNOSIS — E119 Type 2 diabetes mellitus without complications: Secondary | ICD-10-CM | POA: Diagnosis not present

## 2020-02-09 DIAGNOSIS — J449 Chronic obstructive pulmonary disease, unspecified: Secondary | ICD-10-CM | POA: Diagnosis not present

## 2020-02-09 DIAGNOSIS — M5136 Other intervertebral disc degeneration, lumbar region: Secondary | ICD-10-CM | POA: Diagnosis not present

## 2020-02-09 DIAGNOSIS — I2510 Atherosclerotic heart disease of native coronary artery without angina pectoris: Secondary | ICD-10-CM | POA: Diagnosis not present

## 2020-02-09 DIAGNOSIS — M75101 Unspecified rotator cuff tear or rupture of right shoulder, not specified as traumatic: Secondary | ICD-10-CM | POA: Diagnosis not present

## 2020-02-09 DIAGNOSIS — M810 Age-related osteoporosis without current pathological fracture: Secondary | ICD-10-CM | POA: Diagnosis not present

## 2020-02-09 DIAGNOSIS — M170 Bilateral primary osteoarthritis of knee: Secondary | ICD-10-CM | POA: Diagnosis not present

## 2020-02-09 DIAGNOSIS — M48061 Spinal stenosis, lumbar region without neurogenic claudication: Secondary | ICD-10-CM | POA: Diagnosis not present

## 2020-02-09 DIAGNOSIS — G8929 Other chronic pain: Secondary | ICD-10-CM | POA: Diagnosis not present

## 2020-02-16 ENCOUNTER — Telehealth: Payer: Self-pay | Admitting: Family Medicine

## 2020-02-16 NOTE — Telephone Encounter (Signed)
Please arrange for her to get these catheters

## 2020-02-16 NOTE — Telephone Encounter (Signed)
Karl Bales  Social Worker at [place] extension [phone]  The patient wants to [place] a pure wick catheter at home because she used ti at the hospital and liked those catheters better.  Please advise

## 2020-02-17 NOTE — Telephone Encounter (Signed)
Left a detailed message on verified voice mail.   

## 2020-02-18 LAB — CUP PACEART REMOTE DEVICE CHECK
Date Time Interrogation Session: 20210519012837
Implantable Pulse Generator Implant Date: 20181022

## 2020-02-18 NOTE — Telephone Encounter (Signed)
You can just ask the nurses what they need (the type of catheter, what [place] size they need, etc)

## 2020-02-18 NOTE — Telephone Encounter (Signed)
Brien Few called back and stated that an order has to be called in to [phone].

## 2020-02-19 NOTE — Telephone Encounter (Signed)
Left message for patient to call back  

## 2020-02-20 DIAGNOSIS — J449 Chronic obstructive pulmonary disease, unspecified: Secondary | ICD-10-CM | POA: Diagnosis not present

## 2020-02-20 DIAGNOSIS — I2510 Atherosclerotic heart disease of native coronary artery without angina pectoris: Secondary | ICD-10-CM | POA: Diagnosis not present

## 2020-02-20 DIAGNOSIS — M170 Bilateral primary osteoarthritis of knee: Secondary | ICD-10-CM | POA: Diagnosis not present

## 2020-02-20 DIAGNOSIS — G8929 Other chronic pain: Secondary | ICD-10-CM | POA: Diagnosis not present

## 2020-02-20 DIAGNOSIS — E119 Type 2 diabetes mellitus without complications: Secondary | ICD-10-CM | POA: Diagnosis not present

## 2020-02-20 DIAGNOSIS — M75101 Unspecified rotator cuff tear or rupture of right shoulder, not specified as traumatic: Secondary | ICD-10-CM | POA: Diagnosis not present

## 2020-02-20 DIAGNOSIS — M48061 Spinal stenosis, lumbar region without neurogenic claudication: Secondary | ICD-10-CM | POA: Diagnosis not present

## 2020-02-20 DIAGNOSIS — M5136 Other intervertebral disc degeneration, lumbar region: Secondary | ICD-10-CM | POA: Diagnosis not present

## 2020-02-20 DIAGNOSIS — M810 Age-related osteoporosis without current pathological fracture: Secondary | ICD-10-CM | POA: Diagnosis not present

## 2020-02-21 DIAGNOSIS — I2510 Atherosclerotic heart disease of native coronary artery without angina pectoris: Secondary | ICD-10-CM | POA: Diagnosis not present

## 2020-02-21 DIAGNOSIS — M48061 Spinal stenosis, lumbar region without neurogenic claudication: Secondary | ICD-10-CM | POA: Diagnosis not present

## 2020-02-21 DIAGNOSIS — M75101 Unspecified rotator cuff tear or rupture of right shoulder, not specified as traumatic: Secondary | ICD-10-CM | POA: Diagnosis not present

## 2020-02-21 DIAGNOSIS — J449 Chronic obstructive pulmonary disease, unspecified: Secondary | ICD-10-CM | POA: Diagnosis not present

## 2020-02-21 DIAGNOSIS — M170 Bilateral primary osteoarthritis of knee: Secondary | ICD-10-CM | POA: Diagnosis not present

## 2020-02-21 DIAGNOSIS — G8929 Other chronic pain: Secondary | ICD-10-CM | POA: Diagnosis not present

## 2020-02-21 DIAGNOSIS — E119 Type 2 diabetes mellitus without complications: Secondary | ICD-10-CM | POA: Diagnosis not present

## 2020-02-21 DIAGNOSIS — M5136 Other intervertebral disc degeneration, lumbar region: Secondary | ICD-10-CM | POA: Diagnosis not present

## 2020-02-21 DIAGNOSIS — M810 Age-related osteoporosis without current pathological fracture: Secondary | ICD-10-CM | POA: Diagnosis not present

## 2020-02-23 ENCOUNTER — Ambulatory Visit (INDEPENDENT_AMBULATORY_CARE_PROVIDER_SITE_OTHER): Payer: Medicare PPO | Admitting: *Deleted

## 2020-02-23 DIAGNOSIS — I639 Cerebral infarction, unspecified: Secondary | ICD-10-CM | POA: Diagnosis not present

## 2020-02-24 DIAGNOSIS — M1712 Unilateral primary osteoarthritis, left knee: Secondary | ICD-10-CM | POA: Diagnosis not present

## 2020-02-24 DIAGNOSIS — M1711 Unilateral primary osteoarthritis, right knee: Secondary | ICD-10-CM | POA: Diagnosis not present

## 2020-02-24 NOTE — Telephone Encounter (Signed)
Paper orders were received and completed last week. Nothing further needed.

## 2020-02-24 NOTE — Progress Notes (Signed)
[facility] Summary Report / Loop Recorder 

## 2020-02-25 DIAGNOSIS — J449 Chronic obstructive pulmonary disease, unspecified: Secondary | ICD-10-CM | POA: Diagnosis not present

## 2020-02-25 DIAGNOSIS — M5136 Other intervertebral disc degeneration, lumbar region: Secondary | ICD-10-CM | POA: Diagnosis not present

## 2020-02-25 DIAGNOSIS — G8929 Other chronic pain: Secondary | ICD-10-CM | POA: Diagnosis not present

## 2020-02-25 DIAGNOSIS — M810 Age-related osteoporosis without current pathological fracture: Secondary | ICD-10-CM | POA: Diagnosis not present

## 2020-02-25 DIAGNOSIS — M75101 Unspecified rotator cuff tear or rupture of right shoulder, not specified as traumatic: Secondary | ICD-10-CM | POA: Diagnosis not present

## 2020-02-25 DIAGNOSIS — M170 Bilateral primary osteoarthritis of knee: Secondary | ICD-10-CM | POA: Diagnosis not present

## 2020-02-25 DIAGNOSIS — M48061 Spinal stenosis, lumbar region without neurogenic claudication: Secondary | ICD-10-CM | POA: Diagnosis not present

## 2020-02-25 DIAGNOSIS — E119 Type 2 diabetes mellitus without complications: Secondary | ICD-10-CM | POA: Diagnosis not present

## 2020-02-25 DIAGNOSIS — I2510 Atherosclerotic heart disease of native coronary artery without angina pectoris: Secondary | ICD-10-CM | POA: Diagnosis not present

## 2020-03-03 DIAGNOSIS — I2510 Atherosclerotic heart disease of native coronary artery without angina pectoris: Secondary | ICD-10-CM | POA: Diagnosis not present

## 2020-03-03 DIAGNOSIS — J449 Chronic obstructive pulmonary disease, unspecified: Secondary | ICD-10-CM | POA: Diagnosis not present

## 2020-03-03 DIAGNOSIS — M170 Bilateral primary osteoarthritis of knee: Secondary | ICD-10-CM | POA: Diagnosis not present

## 2020-03-03 DIAGNOSIS — M75101 Unspecified rotator cuff tear or rupture of right shoulder, not specified as traumatic: Secondary | ICD-10-CM | POA: Diagnosis not present

## 2020-03-03 DIAGNOSIS — M5136 Other intervertebral disc degeneration, lumbar region: Secondary | ICD-10-CM | POA: Diagnosis not present

## 2020-03-03 DIAGNOSIS — E119 Type 2 diabetes mellitus without complications: Secondary | ICD-10-CM | POA: Diagnosis not present

## 2020-03-03 DIAGNOSIS — M48061 Spinal stenosis, lumbar region without neurogenic claudication: Secondary | ICD-10-CM | POA: Diagnosis not present

## 2020-03-03 DIAGNOSIS — G8929 Other chronic pain: Secondary | ICD-10-CM | POA: Diagnosis not present

## 2020-03-03 DIAGNOSIS — M810 Age-related osteoporosis without current pathological fracture: Secondary | ICD-10-CM | POA: Diagnosis not present

## 2020-03-04 NOTE — Telephone Encounter (Signed)
Spoke with the patient. She is aware that we completed and faxed orders back for her a week ago. Patient was advised to contact Letra to see if anything further was needed. She stated she would call Brien Few and call us back if anything further was needed from [place].

## 2020-03-04 NOTE — Telephone Encounter (Signed)
Pt called to check on the status of the catheter-she referred to it as a purewick?   Pt would like a call back at [phone]

## 2020-03-05 ENCOUNTER — Telehealth: Payer: Self-pay | Admitting: *Deleted

## 2020-03-05 NOTE — Telephone Encounter (Signed)
Patient called after hours line. Patient reports she is calling about an order that was not ordered

## 2020-03-05 NOTE — Telephone Encounter (Signed)
ATC the insurance company and was unable to reach anyone. Sat on hold for 30 minutes. Will call back.

## 2020-03-05 NOTE — Telephone Encounter (Signed)
See other phone note

## 2020-03-05 NOTE — Telephone Encounter (Signed)
Pt is calling in stating that she spoke with her insurance company and they have not received any information about the catheter that she was trying to obtain.  Pt would like to have a call back to let her know what was done.  Per Ria Comment she state that she will refax and also call the insurance company.

## 2020-03-05 NOTE — Telephone Encounter (Signed)
ATC the patient to give her an update. Phone line was busy and unable to leave a message.

## 2020-03-08 ENCOUNTER — Telehealth: Payer: Self-pay | Admitting: Family Medicine

## 2020-03-08 DIAGNOSIS — N320 Bladder-neck obstruction: Secondary | ICD-10-CM

## 2020-03-08 NOTE — Telephone Encounter (Signed)
Brien Few from [place] stated she was assistanting the pt with getting purewick catheter. Brien Few stated that this particular catheter is needing a PA from her PCP and if Sarajane Jews is okay with administrating this he can call [phone] to start the PA.   Brien Few states she spoke to [facility] CMA regarding this but does not see a PA in their system   Karl Bales can be reached at [phone] ext [phone]  The pt was also on the line and she said she has a lot of pain in her leg and cannot apply pressure. It is swollen real bad and suppose to take fluid pills but unable to do so due to not being able to go to the bathroom. She said her leg hurts to the touch and swollen real tight. Pt said if she had the catheter it would better assist her with this.   Pt would like a call back at [phone] -leave a detailed message ok per pt

## 2020-03-09 ENCOUNTER — Telehealth: Payer: Self-pay

## 2020-03-09 DIAGNOSIS — I5022 Chronic systolic (congestive) heart failure: Secondary | ICD-10-CM

## 2020-03-09 NOTE — Telephone Encounter (Signed)
She would not qualify for Hospice but she would be perfect for Palliative care. Please refer her to Palliative care

## 2020-03-09 NOTE — Telephone Encounter (Signed)
She is already taking a strong narcotic medication, so I cannot give her anything stronger

## 2020-03-09 NOTE — Telephone Encounter (Signed)
This needs to be managed by Urology. I just did an urgent referral to them

## 2020-03-09 NOTE — Telephone Encounter (Signed)
Spoke with the patient. She is aware of Dr. Fry's message below. Nothing further needed.  °

## 2020-03-09 NOTE — Telephone Encounter (Signed)
[employer] Nurse called and stated that she has spoken to the patient about receiving aid from hospice. The patient has agreed to this and would like an order placed for hospice. Please advise. [place] for orders?

## 2020-03-09 NOTE — Telephone Encounter (Signed)
Spoke with the patient. She is aware that urology will have to handle these orders. She is also aware that a referral has been placed and that their office will be contacting her. Patient was also given the contact info for urology.   She stated that the pain Medication Dr. Sarajane Jews gave her is not working. She stated "it is like taking water"  Please advise.

## 2020-03-10 NOTE — Telephone Encounter (Signed)
Referral has been placed. Spoke with the patient, she is aware referral has been placed.

## 2020-03-10 NOTE — Addendum Note (Signed)
Addended by: Rebecca Eaton on: 03/10/2020 09:14 AM   Modules accepted: Orders

## 2020-03-12 DIAGNOSIS — M75101 Unspecified rotator cuff tear or rupture of right shoulder, not specified as traumatic: Secondary | ICD-10-CM | POA: Diagnosis not present

## 2020-03-12 DIAGNOSIS — M48061 Spinal stenosis, lumbar region without neurogenic claudication: Secondary | ICD-10-CM | POA: Diagnosis not present

## 2020-03-12 DIAGNOSIS — J449 Chronic obstructive pulmonary disease, unspecified: Secondary | ICD-10-CM | POA: Diagnosis not present

## 2020-03-12 DIAGNOSIS — M5136 Other intervertebral disc degeneration, lumbar region: Secondary | ICD-10-CM | POA: Diagnosis not present

## 2020-03-12 DIAGNOSIS — I2510 Atherosclerotic heart disease of native coronary artery without angina pectoris: Secondary | ICD-10-CM | POA: Diagnosis not present

## 2020-03-12 DIAGNOSIS — M810 Age-related osteoporosis without current pathological fracture: Secondary | ICD-10-CM | POA: Diagnosis not present

## 2020-03-12 DIAGNOSIS — G8929 Other chronic pain: Secondary | ICD-10-CM | POA: Diagnosis not present

## 2020-03-12 DIAGNOSIS — M170 Bilateral primary osteoarthritis of knee: Secondary | ICD-10-CM | POA: Diagnosis not present

## 2020-03-12 DIAGNOSIS — E119 Type 2 diabetes mellitus without complications: Secondary | ICD-10-CM | POA: Diagnosis not present

## 2020-03-15 DIAGNOSIS — N3941 Urge incontinence: Secondary | ICD-10-CM | POA: Diagnosis not present

## 2020-03-16 ENCOUNTER — Telehealth: Payer: Self-pay

## 2020-03-16 NOTE — Telephone Encounter (Signed)
Call to patient, no answer Call to son, [name] to cancel and reschedule 6/23 palliative care appt. Message left

## 2020-03-17 ENCOUNTER — Emergency Department (HOSPITAL_BASED_OUTPATIENT_CLINIC_OR_DEPARTMENT_OTHER): Payer: Medicare PPO

## 2020-03-17 ENCOUNTER — Emergency Department (HOSPITAL_COMMUNITY): Payer: Medicare PPO

## 2020-03-17 ENCOUNTER — Other Ambulatory Visit: Payer: Self-pay

## 2020-03-17 ENCOUNTER — Emergency Department (HOSPITAL_COMMUNITY)
Admission: EM | Admit: 2020-03-17 | Discharge: 2020-03-17 | Disposition: A | Discharge status: Home / Self Care | Payer: Medicare PPO | Attending: Emergency Medicine | Admitting: Emergency Medicine

## 2020-03-17 DIAGNOSIS — J441 Chronic obstructive pulmonary disease with (acute) exacerbation: Secondary | ICD-10-CM | POA: Diagnosis not present

## 2020-03-17 DIAGNOSIS — I130 Hypertensive heart and chronic kidney disease with heart failure and stage 1 through stage 4 chronic kidney disease, or unspecified chronic kidney disease: Secondary | ICD-10-CM | POA: Insufficient documentation

## 2020-03-17 DIAGNOSIS — M25561 Pain in right knee: Secondary | ICD-10-CM | POA: Diagnosis not present

## 2020-03-17 DIAGNOSIS — E1165 Type 2 diabetes mellitus with hyperglycemia: Secondary | ICD-10-CM | POA: Diagnosis not present

## 2020-03-17 DIAGNOSIS — Z79899 Other long term (current) drug therapy: Secondary | ICD-10-CM | POA: Diagnosis not present

## 2020-03-17 DIAGNOSIS — E1142 Type 2 diabetes mellitus with diabetic polyneuropathy: Secondary | ICD-10-CM | POA: Diagnosis not present

## 2020-03-17 DIAGNOSIS — I10 Essential (primary) hypertension: Secondary | ICD-10-CM | POA: Diagnosis not present

## 2020-03-17 DIAGNOSIS — Z7984 Long term (current) use of oral hypoglycemic drugs: Secondary | ICD-10-CM | POA: Diagnosis not present

## 2020-03-17 DIAGNOSIS — N189 Chronic kidney disease, unspecified: Secondary | ICD-10-CM | POA: Insufficient documentation

## 2020-03-17 DIAGNOSIS — R609 Edema, unspecified: Secondary | ICD-10-CM

## 2020-03-17 DIAGNOSIS — I5033 Acute on chronic diastolic (congestive) heart failure: Secondary | ICD-10-CM | POA: Diagnosis not present

## 2020-03-17 DIAGNOSIS — I2510 Atherosclerotic heart disease of native coronary artery without angina pectoris: Secondary | ICD-10-CM | POA: Diagnosis not present

## 2020-03-17 DIAGNOSIS — M25551 Pain in right hip: Secondary | ICD-10-CM | POA: Diagnosis present

## 2020-03-17 NOTE — ED Notes (Signed)
Pt asks to wait to do vitals, as she may be leaving. Pt states that she has been here for 2 hours and she is calling her son. Pt informed that provider has signed up for her, and should be coming to see her soon.

## 2020-03-17 NOTE — ED Triage Notes (Signed)
Right knee pain and traveling up to Right hip Denies any fall.

## 2020-03-17 NOTE — Progress Notes (Signed)
Lower extremity venous has been completed.   Preliminary results in CV Proc.   Abram Sander 03/17/2020 1:38 PM

## 2020-03-17 NOTE — ED Notes (Signed)
Pt assisted to restroom via wheelchair. Pt instructed to use call light for assistance

## 2020-03-17 NOTE — ED Notes (Addendum)
Pt returning from restroom at this time. [place] at bedside. Vitals unable to be obtained.

## 2020-03-17 NOTE — ED Notes (Signed)
Pt to XR

## 2020-03-17 NOTE — Discharge Instructions (Signed)
Please follow-up with your orthopedic doctor.  Please continue to take Tylenol for pain 1000 mg every 6 hours. I have also provided you with a palliative medicine doctors phone number.  Please call to make a follow-up appointment with them.

## 2020-03-17 NOTE — ED Provider Notes (Signed)
[facility] DEPT Provider Note   CSN: [id number] Arrival date & time: 03/17/20  1024     History Chief Complaint  Patient presents with  . Knee Pain    Melanie Mcdonald is a 86 y.o. female.  HPI  Patient is a 86 year old female past medical history detailed below presented today for right knee pain and right hip pain.  The symptoms have been ongoing for many years.  She states that she has regular knee injections by orthopedic doctor.  She states that she recently had a spine injection from a spine doctor approximately 2 weeks ago and states that she has had continued pain after this.  She denies any fevers, chills, nausea, vomiting, lightheadedness, dizziness.  She denies any bowel or bladder incontinence.  States that she is still able to walk with her walker which is her baseline.  She states that the pain is achy, constant, worse with movement and better with rest.  She states that she has occasionally had knee effusions she sees her primary care doctor regularly for this.     Past Medical History:  Diagnosis Date  . Abdominal pain, unspecified site 03/09/2014  . Acute on chronic diastolic CHF (congestive heart failure), NYHA class 3 ([place]) 11/03/2014   Acute on chronic diastolic heart failure  . Acute respiratory failure ([place]) 06/17/2018  . ANEMIA 01/30/2008  . ARTHRITIS 12/11/2007  . BACK PAIN, CHRONIC 06/23/2008  . Backache 06/23/2008   Qualifier: Diagnosis of  By: Niel Hummer MD, [place] CYST, RIGHT 12/17/2007  . Cancer ([place])   . Candidiasis of genitalia in female 07/20/2017  . Cellulitis of left leg 08/14/2011  . Cellulitis of right foot 06/13/2016  . Cervicogenic headache 06/14/2015  . Chest pain 06/08/2011   Atypical chest pain  . Chest pain 07/20/2017  . CHF (congestive heart failure) ([place])   . CHF (congestive heart failure), NYHA class II, chronic, systolic ([place]) 01/08/1855  . Colon polyps    FRAGMENTS OF HYPERPLASTIC POLYP  . CONTACT  DERMATITIS 03/10/2009  . COPD (chronic obstructive pulmonary disease) ([place])   . CORONARY ARTERY DISEASE 03/01/2007   had a normal Myoview stress test 07-13-11  . Coronary atherosclerosis 03/01/2007   Qualifier: Diagnosis of  By: Cori Razor RN, Mikal Plane   . CVA (cerebral vascular accident) ([place]) 07/20/2017  . CYSTOCELE WITHOUT MENTION UTERINE PROLAPSE LAT 09/12/2010  . DEGENERATIVE JOINT DISEASE 08/07/2007  . DEPRESSION 03/01/2007  . Depression, recurrent ([place]) 03/01/2007   Qualifier: Diagnosis of  By: Cori Razor RN, Mikal Plane   . DIABETES MELLITUS, TYPE II 08/26/2008  . Diabetic polyneuropathy associated with diabetes mellitus due to underlying condition ([place]) 06/14/2015  . Diarrhea 08/15/2011  . Diverticulosis of colon (without mention of hemorrhage)   . Dizziness 02/10/2014  . DOE (dyspnea on exertion) 11/03/2014  . Edema 09/02/2008   Centricity Description: EDEMA Qualifier: Diagnosis of  By: Niel Hummer MD, Lorinda Creed  Centricity Description: PERIPHERAL EDEMA Qualifier: Diagnosis of  By: Niel Hummer MD, Lorinda Creed   . Esophageal reflux 11/06/2007  . Essential hypertension 03/01/2007   Qualifier: Diagnosis of  By: Cori Razor RN, Mikal Plane   . Gait instability 11/13/2014  . Gout 06/07/2016  . Headache 12/30/2013  . HIP PAIN 09/15/2010   Qualifier: Diagnosis of  By: Elease Hashimoto MD, [place]    . HYPERKERATOSIS 06/02/2009  . HYPERLIPIDEMIA 08/26/2008  . Hyperlipidemia 08/26/2008   Qualifier: Diagnosis of  By: Cori Razor RN, Mikal Plane HYPERTENSION  03/01/2007  . Hypokalemia 08/15/2011  . Insomnia 12/11/2007  . Irritable bowel syndrome 01/26/2009  . LABYRINTHITIS 11/03/2009  . Labyrinthitis 11/03/2009   Qualifier: Diagnosis of  By: Niel Hummer MD, Lorinda Creed   . Leg swelling 04/05/2016  . Memory loss 12/30/2013  . Narcolepsy without cataplexy 08/26/2008   Qualifier: Diagnosis of  By: Niel Hummer MD, [place] Narcolepsy without cataplexy(347.00) 08/26/2008  . NEPHROLITHIASIS 04/29/2009  . OBESITY 03/01/2007  . Obstructive apnea  11/03/2014  . Osteoarthritis 03/15/2018  . Pain in joint, ankle and foot 12/24/2015  . PERIPHERAL VASCULAR DISEASE 01/26/2009  . Peripheral vascular disease ([place]) 01/26/2009   Qualifier: Diagnosis of  By: Niel Hummer MD, [place] history of colonic polyps 03/09/2014  . Stroke ([place])   . SYNCOPE 08/26/2008   had brain MRI on 06-28-12 showing only chronic microvascular ischemia and atrophy   . SYNDROME, RESTLESS LEGS 03/01/2007   Qualifier: Diagnosis of  By: Cori Razor RN, Mikal Plane Thyroid nodule 04/28/2014  . TIA (transient ischemic attack) 11/13/2014  . TRANSIENT ISCHEMIC ATTACK 10/20/2009   had normal brain MRA with patent vertebrals and carotids 06-28-12  . Uncontrolled type 2 diabetes mellitus with hyperglycemia, with long-term current use of insulin ([place]) 10/09/2016  . Weakness generalized 11/13/2014    Patient Active Problem List   Diagnosis Date Noted  . Urinary incontinence 12/24/2019  . Recurrent falls 11/07/2019  . COPD with acute exacerbation ([place]) 11/07/2019  . Lymphedema of lower extremity 08/15/2019  . Degeneration of lumbar intervertebral disc 05/09/2019  . Spinal stenosis of lumbar region 05/09/2019  . Morbid obesity ([place]) 04/11/2019  . COPD with emphysema ([place]) 04/11/2019  . Trigger little finger of left hand 11/20/2018  . Osteoarthritis 03/15/2018  . CHF (congestive heart failure), NYHA class II, chronic, systolic ([place]) 07/37/1062  . CVA (cerebral vascular accident) ([place]) 07/20/2017  . Candidiasis of genitalia in female 07/20/2017  . Uncontrolled type 2 diabetes mellitus with hyperglycemia, with long-term current use of insulin ([place]) 10/09/2016  . Gout 06/07/2016  . Leg swelling 04/05/2016  . Pain in joint, ankle and foot 12/24/2015  . Diabetic polyneuropathy associated with diabetes mellitus due to underlying condition ([place]) 06/14/2015  . Cervicogenic headache 06/14/2015  . Weakness generalized 11/13/2014  . TIA (transient ischemic attack) 11/13/2014  . Gait  instability 11/13/2014  . Acute on chronic diastolic CHF (congestive heart failure), NYHA class 3 ([place]) 11/03/2014  . Obstructive apnea 11/03/2014  . DOE (dyspnea on exertion) 11/03/2014  . Thyroid nodule 04/28/2014  . Personal history of colonic polyps 03/09/2014  . Dizziness 02/10/2014  . Memory loss 12/30/2013  . Headache 12/30/2013  . Hypokalemia 08/15/2011  . Diarrhea 08/15/2011  . Chest pain 06/08/2011  . HIP PAIN 09/15/2010  . CYSTOCELE WITHOUT MENTION UTERINE PROLAPSE LAT 09/12/2010  . Labyrinthitis 11/03/2009  . NEPHROLITHIASIS 04/29/2009  . Peripheral vascular disease ([place]) 01/26/2009  . IRRITABLE BOWEL SYNDROME 01/26/2009  . Edema 09/02/2008  . Hyperlipidemia 08/26/2008  . Narcolepsy without cataplexy 08/26/2008  . SYNCOPE 08/26/2008  . Backache 06/23/2008  . Insomnia 12/11/2007  . Esophageal reflux 11/06/2007  . Depression, recurrent ([place]) 03/01/2007  . SYNDROME, RESTLESS LEGS 03/01/2007  . Essential hypertension 03/01/2007  . Coronary atherosclerosis 03/01/2007    Past Surgical History:  Procedure Laterality Date  . ABDOMINAL HYSTERECTOMY    . BACK SURGERY     x2  . CARDIAC CATHETERIZATION  07/2009  . CATARACT EXTRACTION    . COLONOSCOPY  11-13-07   per Dr. Deatra Ina, benign polyps, repeat in 5 yrs  . ESOPHAGOGASTRODUODENOSCOPY  11-13-07   per Dr. Deatra Ina, normal   . INTRAOCULAR LENS INSERTION    . LOOP RECORDER INSERTION N/A 07/23/2017   Procedure: LOOP RECORDER INSERTION;  Surgeon: Evans Lance, MD;  Location: [facility];  Service: Cardiovascular;  Laterality: N/A;  . SPINE SURGERY     x 3  . TONSILECTOMY, ADENOIDECTOMY, BILATERAL MYRINGOTOMY AND TUBES       OB History   No obstetric history on file.     Family History  Problem Relation Age of Onset  . Lupus Mother   . COPD Father     Social History   Tobacco Use  . Smoking status: Never Smoker  . Smokeless tobacco: Never Used  Vaping Use  . Vaping Use: Never used  Substance Use  Topics  . Alcohol use: No    Alcohol/week: 0.0 standard drinks  . Drug use: No    Types: Oxycodone    Home Medications Prior to Admission medications   Medication Sig Start Date End Date Taking? Authorizing Provider  albuterol (VENTOLIN HFA) 108 (90 Base) MCG/ACT inhaler Inhale 2 puffs into the lungs every 6 (six) hours as needed for wheezing or shortness of breath. 11/11/19   Shelly Coss, MD  blood glucose meter kit and supplies KIT Use as directed. 11/14/19   Laurey Morale, MD  clopidogrel (PLAVIX) 75 MG tablet TAKE 1 TABLET BY MOUTH EVERY DAY Patient taking differently: Take 75 mg by mouth daily.  10/06/19   Laurey Morale, MD  Continuous Blood Gluc Receiver (FREESTYLE LIBRE 14 DAY READER) DEVI 1 Device by Does not apply route daily. Use device daily as directed 11/10/19   Laurey Morale, MD  Continuous Blood Gluc Sensor (FREESTYLE LIBRE 14 DAY SENSOR) MISC 1 application by Does not apply route every 14 (fourteen) days. 10/31/19   Laurey Morale, MD  diclofenac Sodium (VOLTAREN) 1 % GEL Apply 2 g topically 4 (four) times daily. 08/15/19   [provider]  fluconazole (DIFLUCAN) 100 MG tablet Take 100 mg by mouth daily. 10/05/19   [provider]  furosemide (LASIX) 40 MG tablet Take 2 tablets (80 mg total) by mouth 2 (two) times daily. TAKE 2 TABLETS BY MOUTH TWICE DAILY 12/24/19   Laurey Morale, MD  glucose blood (ONE TOUCH ULTRA TEST) test strip 1 each by Other route as needed for other. Use as instructed 06/21/18   Laurey Morale, MD  HUMALOG KWIKPEN 100 UNIT/ML KwikPen Inject 0.2 mLs (20 Units total) into the skin 3 (three) times daily before meals. 11/01/18   Laurey Morale, MD  HYDROcodone-homatropine (HYDROMET) 5-1.5 MG/5ML syrup Take 5 mLs by mouth every 4 (four) hours as needed. 12/26/18   Laurey Morale, MD  insulin degludec (TRESIBA FLEXTOUCH) 100 UNIT/ML FlexTouch Pen INJECT 80 UNITS INTO THE SKIN EVERY MORNING 12/12/19   Laurey Morale, MD  Insulin Pen Needle  (NOVOFINE) 32G X 6 MM MISC CHECK BLOOD GLUCOSE THREE TIMES DAILY Patient taking differently: 1 each by Other route 3 (three) times daily. CHECK BLOOD GLUCOSE THREE TIMES DAILY 09/03/19   Laurey Morale, MD  meclizine (ANTIVERT) 25 MG tablet Take 1 tablet (25 mg total) by mouth 3 (three) times daily as needed for dizziness. 10/21/19   Laurey Morale, MD  methylPREDNISolone (MEDROL DOSEPAK) 4 MG TBPK tablet As directed 02/06/20   Laurey Morale, MD  mupirocin  ointment (BACTROBAN) 2 % Apply 1 application topically 2 (two) times daily.     [provider]  nitroGLYCERIN (NITROSTAT) 0.4 MG SL tablet Place 0.4 mg under the tongue every 5 (five) minutes as needed for chest pain.    [provider]  oxyCODONE-acetaminophen (PERCOCET) 10-325 MG tablet Take 1 tablet by mouth every 6 (six) hours as needed for pain. 02/23/20 03/24/20  Laurey Morale, MD  phenazopyridine (PYRIDIUM) 200 MG tablet Take 1 tablet (200 mg total) by mouth 3 (three) times daily as needed (urinary pain). 04/11/19   Laurey Morale, MD  pramipexole (MIRAPEX) 1 MG tablet TAKE 4 TABLETS BY MOUTH ONCE DAILY 01/30/20   Laurey Morale, MD  spironolactone (ALDACTONE) 25 MG tablet TAKE 1 TABLET(25 MG) BY MOUTH TWICE DAILY 12/08/19   Laurey Morale, MD  zolpidem (AMBIEN) 10 MG tablet TAKE 1 TABLET(10 MG) BY MOUTH AT BEDTIME AS NEEDED FOR SLEEP 11/17/19   Laurey Morale, MD    Allergies    Lyrica [pregabalin], Aspirin, Ciprofloxacin, Clarithromycin, Doxycycline, Erythromycin, Lisinopril, Macrobid [nitrofurantoin monohyd macro], Metolazone, Other, Penicillins, Sulfonamide derivatives, and Valtrex [valacyclovir hcl]  Review of Systems   Review of Systems  Constitutional: Negative for fever.  HENT: Negative for congestion.   Respiratory: Negative for shortness of breath.   Cardiovascular: Negative for chest pain.  Gastrointestinal: Negative for abdominal distention.  Musculoskeletal:       Right knee pain, right hip pain  Neurological:  Negative for dizziness and headaches.    Physical Exam Updated Vital Signs BP (!) 128/58   Pulse 83   Temp 98.4 F (36.9 C) (Oral)   Resp 16   SpO2 95%   Physical Exam Vitals and nursing note reviewed.  Constitutional:      General: She is not in acute distress.    Comments: 86 year old female chronically ill-appearing  HENT:     Head: Normocephalic and atraumatic.     Nose: Nose normal.  Eyes:     General: No scleral icterus. Cardiovascular:     Rate and Rhythm: Normal rate and regular rhythm.     Pulses: Normal pulses.     Heart sounds: Normal heart sounds.  Pulmonary:     Effort: Pulmonary effort is normal. No respiratory distress.     Breath sounds: No wheezing.  Abdominal:     Palpations: Abdomen is soft.     Tenderness: There is no abdominal tenderness.  Musculoskeletal:     Cervical back: Normal range of motion.     Right lower leg: Edema present.     Left lower leg: Edema present.     Comments: Bilateral lower extremity edema.  Right leg moderate leg swelling.  There is mild erythema but no evidence of cellulitis.  All skin changes are symmetric bilaterally and there is no warmth, purulence, fluctuance.   Good DP/PT pulses symmetric bilaterally.  There is tenderness to palpation of the right lateral knee.  Also tenderness palpation of the right gluteal muscle.  Skin:    General: Skin is warm and dry.     Capillary Refill: Capillary refill takes less than 2 seconds.  Neurological:     Mental Status: She is alert. Mental status is at baseline.  Psychiatric:        Mood and Affect: Mood normal.        Behavior: Behavior normal.     ED Results / Procedures / Treatments   Labs (all labs ordered are listed, but only abnormal results  are displayed) Labs Reviewed - No data to display  EKG None  Radiology DG Knee Complete 4 Views Right  Result Date: 03/17/2020 CLINICAL DATA:  86 y.o c/o right knee pain and traveling up to right hip. Denies any fall. Hx  injections in knee. knee pain; fx unlikely EXAM: RIGHT KNEE - COMPLETE 4+ VIEW COMPARISON:  None. FINDINGS: No fracture of the proximal tibia or distal femur. Patella is normal. No joint effusion. IMPRESSION: No fracture or dislocation.  No effusion Electronically Signed   By: Suzy Bouchard M.D.   On: 03/17/2020 14:56   VAS [place] LOWER EXTREMITY VENOUS (DVT) (ONLY [facility] & [facility] 7a-7p)  Result Date: 03/17/2020  Lower Venous DVTStudy Indications: Edema.  Limitations: Pain tolerance. Comparison Study: 06/17/18 previous Performing Technologist: Abram Sander RVS  Examination Guidelines: A complete evaluation includes B-mode imaging, spectral Doppler, color Doppler, and power Doppler as needed of all accessible portions of each vessel. Bilateral testing is considered an integral part of a complete examination. Limited examinations for reoccurring indications may be performed as noted. The reflux portion of the exam is performed with the patient in reverse Trendelenburg.  +---------+---------------+---------+-----------+----------+--------------+ RIGHT    CompressibilityPhasicitySpontaneityPropertiesThrombus Aging +---------+---------------+---------+-----------+----------+--------------+ CFV      Full           Yes      Yes                                 +---------+---------------+---------+-----------+----------+--------------+ SFJ      Full                                                        +---------+---------------+---------+-----------+----------+--------------+ FV Prox  Full                                                        +---------+---------------+---------+-----------+----------+--------------+ FV Mid                  Yes      Yes                                 +---------+---------------+---------+-----------+----------+--------------+ FV Distal               Yes      Yes                                  +---------+---------------+---------+-----------+----------+--------------+ PFV      Full                                                        +---------+---------------+---------+-----------+----------+--------------+ POP      Full           Yes      Yes                                 +---------+---------------+---------+-----------+----------+--------------+  PTV      Full                                                        +---------+---------------+---------+-----------+----------+--------------+ PERO                                                  Not visualized +---------+---------------+---------+-----------+----------+--------------+   +----+---------------+---------+-----------+----------+--------------+ LEFTCompressibilityPhasicitySpontaneityPropertiesThrombus Aging +----+---------------+---------+-----------+----------+--------------+ CFV Full           Yes      Yes                                 +----+---------------+---------+-----------+----------+--------------+     Summary: RIGHT: - There is no evidence of deep vein thrombosis in the lower extremity.  - No cystic structure found in the popliteal fossa.  LEFT: - No evidence of common femoral vein obstruction.  *See table(s) above for measurements and observations.    Preliminary     Procedures Procedures (including critical care time)  Medications Ordered in ED Medications - No data to display  ED Course  I have reviewed the triage vital signs and the nursing notes.  Pertinent labs & imaging results that were available during my care of the patient were reviewed by me and considered in my medical decision making (see chart for details).  Patient with long-term right knee pain states worse over the past week.  Was concerned because of some swelling in her right leg.  Physical exam notable for some right leg swelling that is more so than her left side.  She is not on any anticoagulation she  is on Plavix.  She has chronic medical diseases preventing her from having surgery done to her knee for osteoarthritis.  We will obtain DVT study to rule out clots and x-ray to rule out any fracture. Doubt cellulitis or other infection.  Clinical Course as of Mar 17 1517  Wed Mar 17, 2020  1350 DVT study negative for clots.   [WF]  1510 I reviewed knee xray negative for fracture.   [WF]    Clinical Course User Index [WF] Gailen Shelter, [place]   I discussed this case with my attending physician who cosigned this note including patient's presenting symptoms, physical exam, and planned diagnostics and interventions. Attending physician stated agreement with plan or made changes to plan which were implemented.   Attending physician assessed patient at bedside.  We will discharge home at this time for follow-up with her orthopedic doctor who does her knee injections.  I also gave patient a palliative care referral as she has questions about end-of-life and states that she feels like she would like to have some surgical planning done.  MDM Rules/Calculators/A&P                         Final Clinical Impression(s) / ED Diagnoses Final diagnoses:  Acute pain of right knee    Rx / DC Orders ED Discharge Orders    None       Gailen Shelter, [place] 03/17/20 1518    Gerhard Munch, MD 03/18/20  2239  

## 2020-03-19 ENCOUNTER — Telehealth: Payer: Self-pay | Admitting: Family Medicine

## 2020-03-19 NOTE — Telephone Encounter (Signed)
Pt stated she went to the ER for pain in her knee. They determined it is arthritis but pt is scheduled for a telephone visit/hospital follow up on Monday 6/21. Pt is wondering if there is anything her PCP can call in for her pain. She does not think she can go the weekend without taking something.   Pharmacy: [facility]: [phone]

## 2020-03-19 NOTE — Telephone Encounter (Signed)
Spoke with the patient. She is aware that the pharmacy should have refills on file for her and will contact them. Nothing further needed.

## 2020-03-19 NOTE — Telephone Encounter (Signed)
She already has refills available for Percocet at her pharmacy. She needs to ask them to fill it

## 2020-03-22 ENCOUNTER — Telehealth (INDEPENDENT_AMBULATORY_CARE_PROVIDER_SITE_OTHER): Payer: Medicare PPO | Admitting: Family Medicine

## 2020-03-22 ENCOUNTER — Encounter: Payer: Self-pay | Admitting: Family Medicine

## 2020-03-22 ENCOUNTER — Other Ambulatory Visit: Payer: Self-pay

## 2020-03-22 DIAGNOSIS — M8949 Other hypertrophic osteoarthropathy, multiple sites: Secondary | ICD-10-CM

## 2020-03-22 DIAGNOSIS — E0842 Diabetes mellitus due to underlying condition with diabetic polyneuropathy: Secondary | ICD-10-CM

## 2020-03-22 DIAGNOSIS — E1142 Type 2 diabetes mellitus with diabetic polyneuropathy: Secondary | ICD-10-CM

## 2020-03-22 DIAGNOSIS — M159 Polyosteoarthritis, unspecified: Secondary | ICD-10-CM

## 2020-03-22 MED ORDER — AMITRIPTYLINE HCL 25 MG PO TABS: 25.0000 mg | ORAL_TABLET | Freq: Every day | ORAL | 5 refills | Status: DC

## 2020-03-22 NOTE — Progress Notes (Signed)
   Subjective:    Patient ID: Melanie Mcdonald, female    DOB: [birth year], 86 y.o.   MRN: [id number]  HPI Virtual Visit via Telephone Note  I connected with the patient on 03/22/20 at  2:30 PM EDT by telephone and verified that I am speaking with the correct person using two identifiers.   I discussed the limitations, risks, security and privacy concerns of performing an evaluation and management service by telephone and the availability of in person appointments. I also discussed with the patient that there may be a patient responsible charge related to this service. The patient expressed understanding and agreed to proceed.  Location patient: home Location provider: work or home office Participants present for the call: patient, provider Patient did not have a visit in the prior 7 days to address this/these issue(s).   History of Present Illness: Here to follow up an ER visit on 03-17-20 for right knee pain. No recent trauma. She has severe OA in the knee and she gets periodic injections for pain. At the ER an Xray revealed no fractures and a venous doppler revealed no DVT. She was told to follow up with [place]. Also she asks for help with diabetic neuropathy. This causes severe burning in both fee. She cannot tolerate either Pregabalin or Gabapentin due to side effects. She takes Percocet as needed .   Observations/Objective: Patient sounds cheerful and well on the phone. I do not appreciate any SOB. Speech and thought processing are grossly intact. Patient reported vitals:  Assessment and Plan: For the chronic knee pain, she is scheduled to see Dr. Despina Hidden tomorrow for another injection. I also reminded her that she has refills for Percocet available at her pharmacy, and that she just needs to call them to get this refilled. For the neuropathy she will try Amitriptyline 25 mg at bedtime.  Alysia Penna, MD   Follow Up Instructions:     [phone] 5-10 [phone] 11-20 9443 21-30 I did not  refer this patient for an OV in the next 24 hours for this/these issue(s).  I discussed the assessment and treatment plan with the patient. The patient was provided an opportunity to ask questions and all were answered. The patient agreed with the plan and demonstrated an understanding of the instructions.   The patient was advised to call back or seek an in-person evaluation if the symptoms worsen or if the condition fails to improve as anticipated.  I provided 18 minutes of non-face-to-face time during this encounter.   Alysia Penna, MD    Review of Systems     Objective:   Physical Exam        Assessment & Plan:

## 2020-03-23 DIAGNOSIS — M545 Low back pain: Secondary | ICD-10-CM | POA: Diagnosis not present

## 2020-03-29 ENCOUNTER — Ambulatory Visit (INDEPENDENT_AMBULATORY_CARE_PROVIDER_SITE_OTHER): Payer: Medicare PPO | Admitting: *Deleted

## 2020-03-29 DIAGNOSIS — I639 Cerebral infarction, unspecified: Secondary | ICD-10-CM | POA: Diagnosis not present

## 2020-03-29 DIAGNOSIS — E1165 Type 2 diabetes mellitus with hyperglycemia: Secondary | ICD-10-CM | POA: Diagnosis not present

## 2020-03-29 LAB — CUP PACEART REMOTE DEVICE CHECK
Date Time Interrogation Session: 20210627231708
Implantable Pulse Generator Implant Date: 20181022

## 2020-03-30 DIAGNOSIS — M545 Low back pain: Secondary | ICD-10-CM | POA: Diagnosis not present

## 2020-03-30 NOTE — Progress Notes (Signed)
[facility] Summary Report / Loop Recorder 

## 2020-04-02 DIAGNOSIS — M5136 Other intervertebral disc degeneration, lumbar region: Secondary | ICD-10-CM | POA: Diagnosis not present

## 2020-04-09 DIAGNOSIS — M5136 Other intervertebral disc degeneration, lumbar region: Secondary | ICD-10-CM | POA: Diagnosis not present

## 2020-04-09 DIAGNOSIS — M545 Low back pain: Secondary | ICD-10-CM | POA: Diagnosis not present

## 2020-04-21 ENCOUNTER — Other Ambulatory Visit: Payer: Self-pay

## 2020-04-21 ENCOUNTER — Emergency Department (HOSPITAL_COMMUNITY): Payer: Medicare PPO

## 2020-04-21 ENCOUNTER — Encounter (HOSPITAL_COMMUNITY): Payer: Self-pay

## 2020-04-21 ENCOUNTER — Emergency Department (HOSPITAL_COMMUNITY)
Admission: EM | Admit: 2020-04-21 | Discharge: 2020-04-21 | Disposition: A | Discharge status: Home / Self Care | Payer: Medicare PPO | Attending: Emergency Medicine | Admitting: Emergency Medicine

## 2020-04-21 DIAGNOSIS — I110 Hypertensive heart disease with heart failure: Secondary | ICD-10-CM | POA: Diagnosis not present

## 2020-04-21 DIAGNOSIS — M79604 Pain in right leg: Secondary | ICD-10-CM | POA: Insufficient documentation

## 2020-04-21 DIAGNOSIS — G8929 Other chronic pain: Secondary | ICD-10-CM | POA: Insufficient documentation

## 2020-04-21 DIAGNOSIS — R5381 Other malaise: Secondary | ICD-10-CM | POA: Diagnosis not present

## 2020-04-21 DIAGNOSIS — E1151 Type 2 diabetes mellitus with diabetic peripheral angiopathy without gangrene: Secondary | ICD-10-CM | POA: Insufficient documentation

## 2020-04-21 DIAGNOSIS — J449 Chronic obstructive pulmonary disease, unspecified: Secondary | ICD-10-CM | POA: Insufficient documentation

## 2020-04-21 DIAGNOSIS — S39012A Strain of muscle, fascia and tendon of lower back, initial encounter: Secondary | ICD-10-CM | POA: Diagnosis not present

## 2020-04-21 DIAGNOSIS — M545 Low back pain: Secondary | ICD-10-CM | POA: Diagnosis not present

## 2020-04-21 DIAGNOSIS — W19XXXA Unspecified fall, initial encounter: Secondary | ICD-10-CM

## 2020-04-21 DIAGNOSIS — S7000XA Contusion of unspecified hip, initial encounter: Secondary | ICD-10-CM

## 2020-04-21 DIAGNOSIS — S8000XA Contusion of unspecified knee, initial encounter: Secondary | ICD-10-CM

## 2020-04-21 DIAGNOSIS — S3993XA Unspecified injury of pelvis, initial encounter: Secondary | ICD-10-CM | POA: Diagnosis not present

## 2020-04-21 DIAGNOSIS — Z90710 Acquired absence of both cervix and uterus: Secondary | ICD-10-CM | POA: Diagnosis not present

## 2020-04-21 DIAGNOSIS — M25551 Pain in right hip: Secondary | ICD-10-CM | POA: Insufficient documentation

## 2020-04-21 DIAGNOSIS — I5042 Chronic combined systolic (congestive) and diastolic (congestive) heart failure: Secondary | ICD-10-CM | POA: Diagnosis not present

## 2020-04-21 DIAGNOSIS — Z794 Long term (current) use of insulin: Secondary | ICD-10-CM | POA: Diagnosis not present

## 2020-04-21 DIAGNOSIS — I2510 Atherosclerotic heart disease of native coronary artery without angina pectoris: Secondary | ICD-10-CM | POA: Diagnosis not present

## 2020-04-21 DIAGNOSIS — I7090 Unspecified atherosclerosis: Secondary | ICD-10-CM | POA: Diagnosis not present

## 2020-04-21 DIAGNOSIS — I700 Atherosclerosis of aorta: Secondary | ICD-10-CM | POA: Diagnosis not present

## 2020-04-21 DIAGNOSIS — Z8673 Personal history of transient ischemic attack (TIA), and cerebral infarction without residual deficits: Secondary | ICD-10-CM | POA: Insufficient documentation

## 2020-04-21 DIAGNOSIS — S8001XA Contusion of right knee, initial encounter: Secondary | ICD-10-CM | POA: Diagnosis not present

## 2020-04-21 DIAGNOSIS — Z79899 Other long term (current) drug therapy: Secondary | ICD-10-CM | POA: Insufficient documentation

## 2020-04-21 DIAGNOSIS — S3992XA Unspecified injury of lower back, initial encounter: Secondary | ICD-10-CM | POA: Diagnosis not present

## 2020-04-21 DIAGNOSIS — I10 Essential (primary) hypertension: Secondary | ICD-10-CM | POA: Diagnosis not present

## 2020-04-21 DIAGNOSIS — S7001XA Contusion of right hip, initial encounter: Secondary | ICD-10-CM | POA: Diagnosis not present

## 2020-04-21 DIAGNOSIS — R400 Somnolence: Secondary | ICD-10-CM | POA: Diagnosis not present

## 2020-04-21 DIAGNOSIS — R52 Pain, unspecified: Secondary | ICD-10-CM | POA: Diagnosis not present

## 2020-04-21 DIAGNOSIS — M25561 Pain in right knee: Secondary | ICD-10-CM | POA: Diagnosis not present

## 2020-04-21 DIAGNOSIS — M47817 Spondylosis without myelopathy or radiculopathy, lumbosacral region: Secondary | ICD-10-CM | POA: Diagnosis not present

## 2020-04-21 DIAGNOSIS — Z743 Need for continuous supervision: Secondary | ICD-10-CM | POA: Diagnosis not present

## 2020-04-21 DIAGNOSIS — M25571 Pain in right ankle and joints of right foot: Secondary | ICD-10-CM | POA: Diagnosis not present

## 2020-04-21 LAB — CBC WITH DIFFERENTIAL/PLATELET
Abs Immature Granulocytes: 0.01 10*3/uL (ref 0.00–0.07)
Basophils Absolute: 0 10*3/uL (ref 0.0–0.1)
Basophils Relative: 0 %
Eosinophils Absolute: 0.1 10*3/uL (ref 0.0–0.5)
Eosinophils Relative: 2 %
HCT: 39.8 % (ref 36.0–46.0)
Hemoglobin: 12.7 g/dL (ref 12.0–15.0)
Immature Granulocytes: 0 %
Lymphocytes Relative: 30 %
Lymphs Abs: 2.2 10*3/uL (ref 0.7–4.0)
MCH: 30 pg (ref 26.0–34.0)
MCHC: 31.9 g/dL (ref 30.0–36.0)
MCV: 93.9 fL (ref 80.0–100.0)
Monocytes Absolute: 0.5 10*3/uL (ref 0.1–1.0)
Monocytes Relative: 6 %
Neutro Abs: 4.4 10*3/uL (ref 1.7–7.7)
Neutrophils Relative %: 62 %
Platelets: 262 10*3/uL (ref 150–400)
RBC: 4.24 MIL/uL (ref 3.87–5.11)
RDW: 13.7 % (ref 11.5–15.5)
WBC: 7.2 10*3/uL (ref 4.0–10.5)
nRBC: 0 % (ref 0.0–0.2)

## 2020-04-21 LAB — BASIC METABOLIC PANEL
Anion gap: 8 (ref 5–15)
BUN: 19 mg/dL (ref 8–23)
CO2: 29 mmol/L (ref 22–32)
Calcium: 9 mg/dL (ref 8.9–10.3)
Chloride: 101 mmol/L (ref 98–111)
Creatinine, Ser: 0.74 mg/dL (ref 0.44–1.00)
GFR calc Af Amer: >60 mL/min (ref >60)
GFR calc non Af Amer: >60 mL/min (ref >60)
Glucose, Bld: 169 mg/dL — ABNORMAL HIGH (ref 70–99)
Potassium: 3.8 mmol/L (ref 3.5–5.1)
Sodium: 138 mmol/L (ref 135–145)

## 2020-04-21 MED ORDER — SODIUM CHLORIDE 0.9 % IV BOLUS
500.0000 mL | Freq: Once | INTRAVENOUS | Status: AC
  Administered 2020-04-21: 500 mL via INTRAVENOUS

## 2020-04-21 MED ORDER — MORPHINE SULFATE (PF) 4 MG/ML IV SOLN
4.0000 mg | Freq: Once | INTRAVENOUS | Status: AC
  Administered 2020-04-21: 4 mg via INTRAVENOUS
  Filled 2020-04-21: qty 1

## 2020-04-21 NOTE — ED Notes (Signed)
Pt reminded of need for UA 

## 2020-04-21 NOTE — ED Notes (Signed)
Pt transported to imaging, delaying IV start.

## 2020-04-21 NOTE — ED Triage Notes (Signed)
Pt states that her legs gave out from under her tonight after going to the bathroom, no deformity noted but she states she went down on her knees and now her hips, knees and right ankle are bothering her

## 2020-04-21 NOTE — ED Notes (Signed)
Visitor at bedside.

## 2020-04-21 NOTE — Discharge Instructions (Addendum)
Continue taking your pain medication as previously prescribed.  Follow-up with your primary doctor if symptoms not improving in the next few days, and return to the ER symptoms significantly worsen or change.

## 2020-04-21 NOTE — ED Provider Notes (Signed)
[facility] DEPT Provider Note   CSN: [id number] Arrival date & time: 04/21/20  0442     History Chief Complaint  Patient presents with  . Hip Pain  . Knee Pain    Melanie Mcdonald is a 86 y.o. female.  Patient is an 86 year old female with extensive past medical history including chronic back pain, osteoarthritis, CHF, anemia, diabetes, obesity, hypertension.  Patient presents for evaluation of pain in her back and leg.  Patient is receiving steroid injections in her back and also follows with Dr. Tonita Cong for chronic leg pain.  She presents today for complaints of low back pain radiating into the right leg.  This became worse when she attempted to walk yesterday evening.  Her legs gave out on her causing her to fall to the ground.  She denies any bowel or bladder complaints.  She denies any numbness or tingling.  The history is provided by the patient.       Past Medical History:  Diagnosis Date  . Abdominal pain, unspecified site 03/09/2014  . Acute on chronic diastolic CHF (congestive heart failure), NYHA class 3 ([place]) 11/03/2014   Acute on chronic diastolic heart failure  . Acute respiratory failure ([place]) 06/17/2018  . ANEMIA 01/30/2008  . ARTHRITIS 12/11/2007  . BACK PAIN, CHRONIC 06/23/2008  . Backache 06/23/2008   Qualifier: Diagnosis of  By: Niel Hummer MD, [place] CYST, RIGHT 12/17/2007  . Cancer ([place])   . Candidiasis of genitalia in female 07/20/2017  . Cellulitis of left leg 08/14/2011  . Cellulitis of right foot 06/13/2016  . Cervicogenic headache 06/14/2015  . Chest pain 06/08/2011   Atypical chest pain  . Chest pain 07/20/2017  . CHF (congestive heart failure) ([place])   . CHF (congestive heart failure), NYHA class II, chronic, systolic ([place]) 03/04/1307  . Colon polyps    FRAGMENTS OF HYPERPLASTIC POLYP  . CONTACT DERMATITIS 03/10/2009  . COPD (chronic obstructive pulmonary disease) ([place])   . CORONARY ARTERY DISEASE 03/01/2007   had a  normal Myoview stress test 07-13-11  . Coronary atherosclerosis 03/01/2007   Qualifier: Diagnosis of  By: Cori Razor RN, Mikal Plane   . CVA (cerebral vascular accident) ([place]) 07/20/2017  . CYSTOCELE WITHOUT MENTION UTERINE PROLAPSE LAT 09/12/2010  . DEGENERATIVE JOINT DISEASE 08/07/2007  . DEPRESSION 03/01/2007  . Depression, recurrent ([place]) 03/01/2007   Qualifier: Diagnosis of  By: Cori Razor RN, Mikal Plane   . DIABETES MELLITUS, TYPE II 08/26/2008  . Diabetic polyneuropathy associated with diabetes mellitus due to underlying condition ([place]) 06/14/2015  . Diarrhea 08/15/2011  . Diverticulosis of colon (without mention of hemorrhage)   . Dizziness 02/10/2014  . DOE (dyspnea on exertion) 11/03/2014  . Edema 09/02/2008   Centricity Description: EDEMA Qualifier: Diagnosis of  By: Niel Hummer MD, Lorinda Creed  Centricity Description: PERIPHERAL EDEMA Qualifier: Diagnosis of  By: Niel Hummer MD, Lorinda Creed   . Esophageal reflux 11/06/2007  . Essential hypertension 03/01/2007   Qualifier: Diagnosis of  By: Cori Razor RN, Mikal Plane   . Gait instability 11/13/2014  . Gout 06/07/2016  . Headache 12/30/2013  . HIP PAIN 09/15/2010   Qualifier: Diagnosis of  By: Elease Hashimoto MD, [place]    . HYPERKERATOSIS 06/02/2009  . HYPERLIPIDEMIA 08/26/2008  . Hyperlipidemia 08/26/2008   Qualifier: Diagnosis of  By: Cori Razor RN, Mikal Plane HYPERTENSION 03/01/2007  . Hypokalemia 08/15/2011  . Insomnia 12/11/2007  . Irritable bowel syndrome 01/26/2009  . LABYRINTHITIS 11/03/2009  .  Labyrinthitis 11/03/2009   Qualifier: Diagnosis of  By: Niel Hummer MD, Lorinda Creed   . Leg swelling 04/05/2016  . Memory loss 12/30/2013  . Narcolepsy without cataplexy 08/26/2008   Qualifier: Diagnosis of  By: Niel Hummer MD, [place] Narcolepsy without cataplexy(347.00) 08/26/2008  . NEPHROLITHIASIS 04/29/2009  . OBESITY 03/01/2007  . Obstructive apnea 11/03/2014  . Osteoarthritis 03/15/2018  . Pain in joint, ankle and foot 12/24/2015  . PERIPHERAL VASCULAR DISEASE 01/26/2009  .  Peripheral vascular disease ([place]) 01/26/2009   Qualifier: Diagnosis of  By: Niel Hummer MD, [place] history of colonic polyps 03/09/2014  . Stroke ([place])   . SYNCOPE 08/26/2008   had brain MRI on 06-28-12 showing only chronic microvascular ischemia and atrophy   . SYNDROME, RESTLESS LEGS 03/01/2007   Qualifier: Diagnosis of  By: Cori Razor RN, Mikal Plane Thyroid nodule 04/28/2014  . TIA (transient ischemic attack) 11/13/2014  . TRANSIENT ISCHEMIC ATTACK 10/20/2009   had normal brain MRA with patent vertebrals and carotids 06-28-12  . Uncontrolled type 2 diabetes mellitus with hyperglycemia, with long-term current use of insulin ([place]) 10/09/2016  . Weakness generalized 11/13/2014    Patient Active Problem List   Diagnosis Date Noted  . Urinary incontinence 12/24/2019  . Recurrent falls 11/07/2019  . COPD with acute exacerbation ([place]) 11/07/2019  . Lymphedema of lower extremity 08/15/2019  . Degeneration of lumbar intervertebral disc 05/09/2019  . Spinal stenosis of lumbar region 05/09/2019  . Morbid obesity ([place]) 04/11/2019  . COPD with emphysema ([place]) 04/11/2019  . Trigger little finger of left hand 11/20/2018  . Osteoarthritis 03/15/2018  . CHF (congestive heart failure), NYHA class II, chronic, systolic ([place]) 58/06/9832  . CVA (cerebral vascular accident) ([place]) 07/20/2017  . Candidiasis of genitalia in female 07/20/2017  . Uncontrolled type 2 diabetes mellitus with hyperglycemia, with long-term current use of insulin ([place]) 10/09/2016  . Gout 06/07/2016  . Leg swelling 04/05/2016  . Pain in joint, ankle and foot 12/24/2015  . Diabetic polyneuropathy associated with diabetes mellitus due to underlying condition ([place]) 06/14/2015  . Cervicogenic headache 06/14/2015  . Weakness generalized 11/13/2014  . TIA (transient ischemic attack) 11/13/2014  . Gait instability 11/13/2014  . Acute on chronic diastolic CHF (congestive heart failure), NYHA class 3 ([place]) 11/03/2014  . Obstructive  apnea 11/03/2014  . DOE (dyspnea on exertion) 11/03/2014  . Thyroid nodule 04/28/2014  . Personal history of colonic polyps 03/09/2014  . Dizziness 02/10/2014  . Memory loss 12/30/2013  . Headache 12/30/2013  . Hypokalemia 08/15/2011  . Diarrhea 08/15/2011  . Chest pain 06/08/2011  . HIP PAIN 09/15/2010  . CYSTOCELE WITHOUT MENTION UTERINE PROLAPSE LAT 09/12/2010  . Labyrinthitis 11/03/2009  . NEPHROLITHIASIS 04/29/2009  . Peripheral vascular disease ([place]) 01/26/2009  . IRRITABLE BOWEL SYNDROME 01/26/2009  . Edema 09/02/2008  . Hyperlipidemia 08/26/2008  . Narcolepsy without cataplexy 08/26/2008  . SYNCOPE 08/26/2008  . Backache 06/23/2008  . Insomnia 12/11/2007  . Esophageal reflux 11/06/2007  . Depression, recurrent ([place]) 03/01/2007  . SYNDROME, RESTLESS LEGS 03/01/2007  . Essential hypertension 03/01/2007  . Coronary atherosclerosis 03/01/2007    Past Surgical History:  Procedure Laterality Date  . ABDOMINAL HYSTERECTOMY    . BACK SURGERY     x2  . CARDIAC CATHETERIZATION  07/2009  . CATARACT EXTRACTION    . COLONOSCOPY  11-13-07   per Dr. Deatra Ina, benign polyps, repeat in 5 yrs  . ESOPHAGOGASTRODUODENOSCOPY  11-13-07   per  Dr. Arlyce Dice, normal   . INTRAOCULAR LENS INSERTION    . LOOP RECORDER INSERTION N/A 07/23/2017   Procedure: LOOP RECORDER INSERTION;  Surgeon: Marinus Maw, MD;  Location: [facility] INVASIVE CV LAB;  Service: Cardiovascular;  Laterality: N/A;  . SPINE SURGERY     x 3  . TONSILECTOMY, ADENOIDECTOMY, BILATERAL MYRINGOTOMY AND TUBES       OB History   No obstetric history on file.     Family History  Problem Relation Age of Onset  . Lupus Mother   . COPD Father     Social History   Tobacco Use  . Smoking status: Never Smoker  . Smokeless tobacco: Never Used  Vaping Use  . Vaping Use: Never used  Substance Use Topics  . Alcohol use: No    Alcohol/week: 0.0 standard drinks  . Drug use: No    Types: Oxycodone    Home Medications Prior  to Admission medications   Medication Sig Start Date End Date Taking? Authorizing Provider  albuterol (VENTOLIN HFA) 108 (90 Base) MCG/ACT inhaler Inhale 2 puffs into the lungs every 6 (six) hours as needed for wheezing or shortness of breath. 11/11/19   Burnadette Pop, MD  amitriptyline (ELAVIL) 25 MG tablet Take 1 tablet (25 mg total) by mouth at bedtime. 03/22/20   Nelwyn Salisbury, MD  blood glucose meter kit and supplies KIT Use as directed. 11/14/19   Nelwyn Salisbury, MD  clopidogrel (PLAVIX) 75 MG tablet TAKE 1 TABLET BY MOUTH EVERY DAY Patient taking differently: Take 75 mg by mouth daily.  10/06/19   Nelwyn Salisbury, MD  Continuous Blood Gluc Receiver (FREESTYLE LIBRE 14 DAY READER) DEVI 1 Device by Does not apply route daily. Use device daily as directed 11/10/19   Nelwyn Salisbury, MD  Continuous Blood Gluc Sensor (FREESTYLE LIBRE 14 DAY SENSOR) MISC 1 application by Does not apply route every 14 (fourteen) days. 10/31/19   Nelwyn Salisbury, MD  diclofenac Sodium (VOLTAREN) 1 % GEL Apply 2 g topically 4 (four) times daily. 08/15/19   [provider]  fluconazole (DIFLUCAN) 100 MG tablet Take 100 mg by mouth daily. 10/05/19   [provider]  furosemide (LASIX) 40 MG tablet Take 2 tablets (80 mg total) by mouth 2 (two) times daily. TAKE 2 TABLETS BY MOUTH TWICE DAILY 12/24/19   Nelwyn Salisbury, MD  glucose blood (ONE TOUCH ULTRA TEST) test strip 1 each by Other route as needed for other. Use as instructed 06/21/18   Nelwyn Salisbury, MD  HUMALOG KWIKPEN 100 UNIT/ML KwikPen Inject 0.2 mLs (20 Units total) into the skin 3 (three) times daily before meals. 11/01/18   Nelwyn Salisbury, MD  HYDROcodone-homatropine (HYDROMET) 5-1.5 MG/5ML syrup Take 5 mLs by mouth every 4 (four) hours as needed. 12/26/18   Nelwyn Salisbury, MD  insulin degludec (TRESIBA FLEXTOUCH) 100 UNIT/ML FlexTouch Pen INJECT 80 UNITS INTO THE SKIN EVERY MORNING 12/12/19   Nelwyn Salisbury, MD  Insulin Pen Needle (NOVOFINE) 32G X 6 MM MISC  CHECK BLOOD GLUCOSE THREE TIMES DAILY Patient taking differently: 1 each by Other route 3 (three) times daily. CHECK BLOOD GLUCOSE THREE TIMES DAILY 09/03/19   Nelwyn Salisbury, MD  meclizine (ANTIVERT) 25 MG tablet Take 1 tablet (25 mg total) by mouth 3 (three) times daily as needed for dizziness. 10/21/19   Nelwyn Salisbury, MD  methylPREDNISolone (MEDROL DOSEPAK) 4 MG TBPK tablet As directed 02/06/20   Nelwyn Salisbury,  MD  mupirocin ointment (BACTROBAN) 2 % Apply 1 application topically 2 (two) times daily.     [provider]  nitroGLYCERIN (NITROSTAT) 0.4 MG SL tablet Place 0.4 mg under the tongue every 5 (five) minutes as needed for chest pain.    [provider]  phenazopyridine (PYRIDIUM) 200 MG tablet Take 1 tablet (200 mg total) by mouth 3 (three) times daily as needed (urinary pain). 04/11/19   Laurey Morale, MD  pramipexole (MIRAPEX) 1 MG tablet TAKE 4 TABLETS BY MOUTH ONCE DAILY 01/30/20   Laurey Morale, MD  spironolactone (ALDACTONE) 25 MG tablet TAKE 1 TABLET(25 MG) BY MOUTH TWICE DAILY 12/08/19   Laurey Morale, MD  zolpidem (AMBIEN) 10 MG tablet TAKE 1 TABLET(10 MG) BY MOUTH AT BEDTIME AS NEEDED FOR SLEEP 11/17/19   Laurey Morale, MD    Allergies    Lyrica [pregabalin], Aspirin, Ciprofloxacin, Clarithromycin, Doxycycline, Erythromycin, Gabapentin, Lisinopril, Macrobid [nitrofurantoin monohyd macro], Metolazone, Other, Penicillins, Sulfonamide derivatives, and Valtrex [valacyclovir hcl]  Review of Systems   Review of Systems  All other systems reviewed and are negative.   Physical Exam Updated Vital Signs BP (!) 152/77 (BP Location: Right Arm)   Pulse 82   Temp 97.8 F (36.6 C) (Oral)   Resp 18   SpO2 96%   Physical Exam Vitals and nursing note reviewed.  Constitutional:      General: She is not in acute distress.    Appearance: She is well-developed. She is not diaphoretic.  HENT:     Head: Normocephalic and atraumatic.  Cardiovascular:     Rate and  Rhythm: Normal rate and regular rhythm.     Heart sounds: No murmur heard.  No friction rub. No gallop.   Pulmonary:     Effort: Pulmonary effort is normal. No respiratory distress.     Breath sounds: Normal breath sounds. No wheezing.  Abdominal:     General: Bowel sounds are normal. There is no distension.     Palpations: Abdomen is soft.     Tenderness: There is no abdominal tenderness.  Musculoskeletal:        General: Normal range of motion.     Cervical back: Normal range of motion and neck supple.     Right lower leg: Edema present.     Left lower leg: Edema present.     Comments: There is 1-2+ pitting edema of both lower extremities.  There is tenderness over the lateral right hip and right lower lumbar region.  There is no obvious deformity or palpable abnormality.  DP pulses are palpable bilaterally had motor and sensation are intact to both feet.  Skin:    General: Skin is warm and dry.  Neurological:     Mental Status: She is alert and oriented to person, place, and time.     ED Results / Procedures / Treatments   Labs (all labs ordered are listed, but only abnormal results are displayed) Labs Reviewed  BASIC METABOLIC PANEL  CBC WITH DIFFERENTIAL/PLATELET  URINALYSIS, ROUTINE W REFLEX MICROSCOPIC    EKG None  Radiology No results found.  Procedures Procedures (including critical care time)  Medications Ordered in ED Medications  sodium chloride 0.9 % bolus 500 mL (has no administration in time range)  morphine 4 MG/ML injection 4 mg (has no administration in time range)    ED Course  I have reviewed the triage vital signs and the nursing notes.  Pertinent labs & imaging results that were available  during my care of the patient were reviewed by me and considered in my medical decision making (see chart for details).    MDM Rules/Calculators/A&P  Patient presenting here with complaints of pain in her hip, back, knee, and ankle.  Patient has history  of chronic back pain.  She woke up in the night and attempted to ambulate.  She lost her balance and fell.  She is having more pain in her back and leg since.  At this point, patient's imaging studies are all unremarkable with no evidence for fracture.  Patient did have some difficulty transferring from chair to stretcher, but tells me that she normally has this mobility issue.  She is comfortable with returning home and will call her son to come and pick her up.  Patient will be discharged and is advised to continue her Percocet as prescribed as needed for pain and follow-up as scheduled.  Final Clinical Impression(s) / ED Diagnoses Final diagnoses:  None    Rx / DC Orders ED Discharge Orders    None       Veryl Speak, MD 04/21/20 [phone]

## 2020-04-21 NOTE — ED Notes (Signed)
Pt reminded of need for UA. Purewick in place.

## 2020-05-03 ENCOUNTER — Ambulatory Visit (INDEPENDENT_AMBULATORY_CARE_PROVIDER_SITE_OTHER): Payer: Medicare PPO | Admitting: *Deleted

## 2020-05-03 DIAGNOSIS — R5381 Other malaise: Secondary | ICD-10-CM | POA: Diagnosis not present

## 2020-05-03 DIAGNOSIS — K5903 Drug induced constipation: Secondary | ICD-10-CM | POA: Diagnosis not present

## 2020-05-03 DIAGNOSIS — R279 Unspecified lack of coordination: Secondary | ICD-10-CM | POA: Diagnosis not present

## 2020-05-03 DIAGNOSIS — Z743 Need for continuous supervision: Secondary | ICD-10-CM | POA: Diagnosis not present

## 2020-05-03 DIAGNOSIS — R55 Syncope and collapse: Secondary | ICD-10-CM

## 2020-05-03 DIAGNOSIS — R52 Pain, unspecified: Secondary | ICD-10-CM | POA: Diagnosis not present

## 2020-05-03 DIAGNOSIS — M5136 Other intervertebral disc degeneration, lumbar region: Secondary | ICD-10-CM | POA: Diagnosis not present

## 2020-05-03 DIAGNOSIS — G894 Chronic pain syndrome: Secondary | ICD-10-CM | POA: Diagnosis not present

## 2020-05-04 LAB — CUP PACEART REMOTE DEVICE CHECK
Date Time Interrogation Session: 20210730232052
Implantable Pulse Generator Implant Date: 20181022

## 2020-05-05 NOTE — Progress Notes (Signed)
[facility] Summary Report / Loop Recorder 

## 2020-05-07 ENCOUNTER — Emergency Department (HOSPITAL_COMMUNITY): Payer: Medicare PPO

## 2020-05-07 ENCOUNTER — Emergency Department (HOSPITAL_COMMUNITY)
Admission: EM | Admit: 2020-05-07 | Discharge: 2020-05-07 | Disposition: A | Discharge status: Home / Self Care | Payer: Medicare PPO | Attending: Emergency Medicine | Admitting: Emergency Medicine

## 2020-05-07 ENCOUNTER — Other Ambulatory Visit: Payer: Self-pay

## 2020-05-07 ENCOUNTER — Encounter (HOSPITAL_COMMUNITY): Payer: Self-pay

## 2020-05-07 DIAGNOSIS — J439 Emphysema, unspecified: Secondary | ICD-10-CM | POA: Diagnosis not present

## 2020-05-07 DIAGNOSIS — Z7951 Long term (current) use of inhaled steroids: Secondary | ICD-10-CM | POA: Insufficient documentation

## 2020-05-07 DIAGNOSIS — R5381 Other malaise: Secondary | ICD-10-CM | POA: Diagnosis not present

## 2020-05-07 DIAGNOSIS — I110 Hypertensive heart disease with heart failure: Secondary | ICD-10-CM | POA: Insufficient documentation

## 2020-05-07 DIAGNOSIS — G8929 Other chronic pain: Secondary | ICD-10-CM | POA: Diagnosis not present

## 2020-05-07 DIAGNOSIS — R202 Paresthesia of skin: Secondary | ICD-10-CM | POA: Diagnosis not present

## 2020-05-07 DIAGNOSIS — R52 Pain, unspecified: Secondary | ICD-10-CM | POA: Diagnosis not present

## 2020-05-07 DIAGNOSIS — M2550 Pain in unspecified joint: Secondary | ICD-10-CM | POA: Diagnosis not present

## 2020-05-07 DIAGNOSIS — E1142 Type 2 diabetes mellitus with diabetic polyneuropathy: Secondary | ICD-10-CM | POA: Insufficient documentation

## 2020-05-07 DIAGNOSIS — Z794 Long term (current) use of insulin: Secondary | ICD-10-CM | POA: Insufficient documentation

## 2020-05-07 DIAGNOSIS — M5441 Lumbago with sciatica, right side: Secondary | ICD-10-CM | POA: Diagnosis not present

## 2020-05-07 DIAGNOSIS — Z79899 Other long term (current) drug therapy: Secondary | ICD-10-CM | POA: Diagnosis not present

## 2020-05-07 DIAGNOSIS — Z7401 Bed confinement status: Secondary | ICD-10-CM | POA: Diagnosis not present

## 2020-05-07 DIAGNOSIS — I5033 Acute on chronic diastolic (congestive) heart failure: Secondary | ICD-10-CM | POA: Insufficient documentation

## 2020-05-07 DIAGNOSIS — M5489 Other dorsalgia: Secondary | ICD-10-CM | POA: Diagnosis not present

## 2020-05-07 DIAGNOSIS — I2510 Atherosclerotic heart disease of native coronary artery without angina pectoris: Secondary | ICD-10-CM | POA: Diagnosis not present

## 2020-05-07 DIAGNOSIS — E1165 Type 2 diabetes mellitus with hyperglycemia: Secondary | ICD-10-CM | POA: Diagnosis not present

## 2020-05-07 DIAGNOSIS — R531 Weakness: Secondary | ICD-10-CM | POA: Diagnosis not present

## 2020-05-07 DIAGNOSIS — I10 Essential (primary) hypertension: Secondary | ICD-10-CM | POA: Diagnosis not present

## 2020-05-07 DIAGNOSIS — M545 Low back pain: Secondary | ICD-10-CM | POA: Diagnosis not present

## 2020-05-07 MED ORDER — HYDROMORPHONE HCL 1 MG/ML IJ SOLN
1.0000 mg | Freq: Once | INTRAMUSCULAR | Status: AC
  Administered 2020-05-07: 1 mg via INTRAVENOUS
  Filled 2020-05-07: qty 1

## 2020-05-07 MED ORDER — OXYCODONE-ACETAMINOPHEN 5-325 MG PO TABS: 2.0000 | ORAL_TABLET | Freq: Once | ORAL | Status: DC

## 2020-05-07 MED ORDER — SODIUM CHLORIDE 0.9 % IV BOLUS: 500.0000 mL | Freq: Once | INTRAVENOUS | Status: DC

## 2020-05-07 MED ORDER — HYDROMORPHONE HCL 1 MG/ML IJ SOLN
0.5000 mg | Freq: Once | INTRAMUSCULAR | Status: AC
  Administered 2020-05-07: 0.5 mg via INTRAVENOUS
  Filled 2020-05-07: qty 1

## 2020-05-07 NOTE — ED Provider Notes (Signed)
[facility] DEPT Provider Note   CSN: [id number] Arrival date & time: 05/07/20  0277     History Chief Complaint  Patient presents with  . Back Pain    Melanie Mcdonald is a 86 y.o. female w/ hx of chronic lower back pain, spinal cord decompression surgery, presenting to ED with acute lower back pain.  She reports she was seen at her ortho spine clinic on Monday, 5 days ago, for steroid injections into her back.  During the ambulance ride home, she reports she was jostled over rocks while in the stretcher and had sudden severe worsening of her back pain.  It has been 10/10 pain since Monday.  She feels the pain is radiating down her right leg to her toes.  She feels her right leg is weak and she has not been able to walk since Monday.  She normally can walk with a walker.  She denies urinary incontinence or hesitation.  She denies numbness.  She has been taking morphine at home for pain with little relief.  She lives with her son.  Per careeverywhere she is prescribed Hydromorphine 5 mg PO tablets, most recently on 05/03/20  Her orthopedic doctor is Susa Day with [facility]  HPI     Past Medical History:  Diagnosis Date  . Abdominal pain, unspecified site 03/09/2014  . Acute on chronic diastolic CHF (congestive heart failure), NYHA class 3 ([place]) 11/03/2014   Acute on chronic diastolic heart failure  . Acute respiratory failure ([place]) 06/17/2018  . ANEMIA 01/30/2008  . ARTHRITIS 12/11/2007  . BACK PAIN, CHRONIC 06/23/2008  . Backache 06/23/2008   Qualifier: Diagnosis of  By: Niel Hummer MD, [place] CYST, RIGHT 12/17/2007  . Cancer ([place])   . Candidiasis of genitalia in female 07/20/2017  . Cellulitis of left leg 08/14/2011  . Cellulitis of right foot 06/13/2016  . Cervicogenic headache 06/14/2015  . Chest pain 06/08/2011   Atypical chest pain  . Chest pain 07/20/2017  . CHF (congestive heart failure) ([place])   . CHF (congestive heart failure),  NYHA class II, chronic, systolic ([place]) 12/31/2876  . Colon polyps    FRAGMENTS OF HYPERPLASTIC POLYP  . CONTACT DERMATITIS 03/10/2009  . COPD (chronic obstructive pulmonary disease) ([place])   . CORONARY ARTERY DISEASE 03/01/2007   had a normal Myoview stress test 07-13-11  . Coronary atherosclerosis 03/01/2007   Qualifier: Diagnosis of  By: Cori Razor RN, Mikal Plane   . CVA (cerebral vascular accident) ([place]) 07/20/2017  . CYSTOCELE WITHOUT MENTION UTERINE PROLAPSE LAT 09/12/2010  . DEGENERATIVE JOINT DISEASE 08/07/2007  . DEPRESSION 03/01/2007  . Depression, recurrent ([place]) 03/01/2007   Qualifier: Diagnosis of  By: Cori Razor RN, Mikal Plane   . DIABETES MELLITUS, TYPE II 08/26/2008  . Diabetic polyneuropathy associated with diabetes mellitus due to underlying condition ([place]) 06/14/2015  . Diarrhea 08/15/2011  . Diverticulosis of colon (without mention of hemorrhage)   . Dizziness 02/10/2014  . DOE (dyspnea on exertion) 11/03/2014  . Edema 09/02/2008   Centricity Description: EDEMA Qualifier: Diagnosis of  By: Niel Hummer MD, Lorinda Creed  Centricity Description: PERIPHERAL EDEMA Qualifier: Diagnosis of  By: Niel Hummer MD, Lorinda Creed   . Esophageal reflux 11/06/2007  . Essential hypertension 03/01/2007   Qualifier: Diagnosis of  By: Cori Razor RN, Mikal Plane   . Gait instability 11/13/2014  . Gout 06/07/2016  . Headache 12/30/2013  . HIP PAIN 09/15/2010   Qualifier: Diagnosis of  By: Elease Hashimoto  MD, Darnell Level    . HYPERKERATOSIS 06/02/2009  . HYPERLIPIDEMIA 08/26/2008  . Hyperlipidemia 08/26/2008   Qualifier: Diagnosis of  By: Cori Razor RN, Mikal Plane HYPERTENSION 03/01/2007  . Hypokalemia 08/15/2011  . Insomnia 12/11/2007  . Irritable bowel syndrome 01/26/2009  . LABYRINTHITIS 11/03/2009  . Labyrinthitis 11/03/2009   Qualifier: Diagnosis of  By: Niel Hummer MD, Lorinda Creed   . Leg swelling 04/05/2016  . Memory loss 12/30/2013  . Narcolepsy without cataplexy 08/26/2008   Qualifier: Diagnosis of  By: Niel Hummer MD, [place] Narcolepsy  without cataplexy(347.00) 08/26/2008  . NEPHROLITHIASIS 04/29/2009  . OBESITY 03/01/2007  . Obstructive apnea 11/03/2014  . Osteoarthritis 03/15/2018  . Pain in joint, ankle and foot 12/24/2015  . PERIPHERAL VASCULAR DISEASE 01/26/2009  . Peripheral vascular disease ([place]) 01/26/2009   Qualifier: Diagnosis of  By: Niel Hummer MD, [place] history of colonic polyps 03/09/2014  . Stroke ([place])   . SYNCOPE 08/26/2008   had brain MRI on 06-28-12 showing only chronic microvascular ischemia and atrophy   . SYNDROME, RESTLESS LEGS 03/01/2007   Qualifier: Diagnosis of  By: Cori Razor RN, Mikal Plane Thyroid nodule 04/28/2014  . TIA (transient ischemic attack) 11/13/2014  . TRANSIENT ISCHEMIC ATTACK 10/20/2009   had normal brain MRA with patent vertebrals and carotids 06-28-12  . Uncontrolled type 2 diabetes mellitus with hyperglycemia, with long-term current use of insulin ([place]) 10/09/2016  . Weakness generalized 11/13/2014    Patient Active Problem List   Diagnosis Date Noted  . Urinary incontinence 12/24/2019  . Recurrent falls 11/07/2019  . COPD with acute exacerbation ([place]) 11/07/2019  . Lymphedema of lower extremity 08/15/2019  . Degeneration of lumbar intervertebral disc 05/09/2019  . Spinal stenosis of lumbar region 05/09/2019  . Morbid obesity ([place]) 04/11/2019  . COPD with emphysema ([place]) 04/11/2019  . Trigger little finger of left hand 11/20/2018  . Osteoarthritis 03/15/2018  . CHF (congestive heart failure), NYHA class II, chronic, systolic ([place]) 95/62/1308  . CVA (cerebral vascular accident) ([place]) 07/20/2017  . Candidiasis of genitalia in female 07/20/2017  . Uncontrolled type 2 diabetes mellitus with hyperglycemia, with long-term current use of insulin ([place]) 10/09/2016  . Gout 06/07/2016  . Leg swelling 04/05/2016  . Pain in joint, ankle and foot 12/24/2015  . Diabetic polyneuropathy associated with diabetes mellitus due to underlying condition ([place]) 06/14/2015  . Cervicogenic  headache 06/14/2015  . Weakness generalized 11/13/2014  . TIA (transient ischemic attack) 11/13/2014  . Gait instability 11/13/2014  . Acute on chronic diastolic CHF (congestive heart failure), NYHA class 3 ([place]) 11/03/2014  . Obstructive apnea 11/03/2014  . DOE (dyspnea on exertion) 11/03/2014  . Thyroid nodule 04/28/2014  . Personal history of colonic polyps 03/09/2014  . Dizziness 02/10/2014  . Memory loss 12/30/2013  . Headache 12/30/2013  . Hypokalemia 08/15/2011  . Diarrhea 08/15/2011  . Chest pain 06/08/2011  . HIP PAIN 09/15/2010  . CYSTOCELE WITHOUT MENTION UTERINE PROLAPSE LAT 09/12/2010  . Labyrinthitis 11/03/2009  . NEPHROLITHIASIS 04/29/2009  . Peripheral vascular disease ([place]) 01/26/2009  . IRRITABLE BOWEL SYNDROME 01/26/2009  . Edema 09/02/2008  . Hyperlipidemia 08/26/2008  . Narcolepsy without cataplexy 08/26/2008  . SYNCOPE 08/26/2008  . Backache 06/23/2008  . Insomnia 12/11/2007  . Esophageal reflux 11/06/2007  . Depression, recurrent ([place]) 03/01/2007  . SYNDROME, RESTLESS LEGS 03/01/2007  . Essential hypertension 03/01/2007  . Coronary atherosclerosis 03/01/2007    Past Surgical History:  Procedure Laterality  Date  . ABDOMINAL HYSTERECTOMY    . BACK SURGERY     x2  . CARDIAC CATHETERIZATION  07/2009  . CATARACT EXTRACTION    . COLONOSCOPY  11-13-07   per Dr. Deatra Ina, benign polyps, repeat in 5 yrs  . ESOPHAGOGASTRODUODENOSCOPY  11-13-07   per Dr. Deatra Ina, normal   . INTRAOCULAR LENS INSERTION    . LOOP RECORDER INSERTION N/A 07/23/2017   Procedure: LOOP RECORDER INSERTION;  Surgeon: Evans Lance, MD;  Location: [facility];  Service: Cardiovascular;  Laterality: N/A;  . SPINE SURGERY     x 3  . TONSILECTOMY, ADENOIDECTOMY, BILATERAL MYRINGOTOMY AND TUBES       OB History   No obstetric history on file.     Family History  Problem Relation Age of Onset  . Lupus Mother   . COPD Father     Social History   Tobacco Use  . Smoking  status: Never Smoker  . Smokeless tobacco: Never Used  Vaping Use  . Vaping Use: Never used  Substance Use Topics  . Alcohol use: No    Alcohol/week: 0.0 standard drinks  . Drug use: No    Types: Oxycodone    Home Medications Prior to Admission medications   Medication Sig Start Date End Date Taking? Authorizing Provider  acetaminophen (TYLENOL) 500 MG tablet Take 1,000 mg by mouth every 4 (four) hours as needed for moderate pain. Taking with oxycodone    [provider]  albuterol (VENTOLIN HFA) 108 (90 Base) MCG/ACT inhaler Inhale 2 puffs into the lungs every 6 (six) hours as needed for wheezing or shortness of breath. 11/11/19   Shelly Coss, MD  amitriptyline (ELAVIL) 25 MG tablet Take 1 tablet (25 mg total) by mouth at bedtime. 03/22/20   Laurey Morale, MD  blood glucose meter kit and supplies KIT Use as directed. 11/14/19   Laurey Morale, MD  clopidogrel (PLAVIX) 75 MG tablet TAKE 1 TABLET BY MOUTH EVERY DAY Patient taking differently: Take 75 mg by mouth at bedtime.  10/06/19   Laurey Morale, MD  Continuous Blood Gluc Receiver (FREESTYLE LIBRE 14 DAY READER) DEVI 1 Device by Does not apply route daily. Use device daily as directed 11/10/19   Laurey Morale, MD  Continuous Blood Gluc Sensor (FREESTYLE LIBRE 14 DAY SENSOR) MISC 1 application by Does not apply route every 14 (fourteen) days. 10/31/19   Laurey Morale, MD  diclofenac Sodium (VOLTAREN) 1 % GEL Apply 2 g topically 4 (four) times daily. 08/15/19   [provider]  estradiol (ESTRACE) 0.1 MG/GM vaginal cream Place 1 Applicatorful vaginally at bedtime. 01/30/20   [provider]  furosemide (LASIX) 40 MG tablet Take 2 tablets (80 mg total) by mouth 2 (two) times daily. TAKE 2 TABLETS BY MOUTH TWICE DAILY Patient taking differently: Take 80 mg by mouth 2 (two) times daily.  12/24/19   Laurey Morale, MD  glucose blood (ONE TOUCH ULTRA TEST) test strip 1 each by Other route as needed for other. Use as  instructed 06/21/18   Laurey Morale, MD  HUMALOG KWIKPEN 100 UNIT/ML KwikPen Inject 0.2 mLs (20 Units total) into the skin 3 (three) times daily before meals. 11/01/18   Laurey Morale, MD  HYDROcodone-homatropine (HYDROMET) 5-1.5 MG/5ML syrup Take 5 mLs by mouth every 4 (four) hours as needed. Patient not taking: Reported on 04/21/2020 12/26/18   Laurey Morale, MD  insulin degludec (TRESIBA FLEXTOUCH) 100 UNIT/ML FlexTouch  Pen INJECT 80 UNITS INTO THE SKIN EVERY MORNING Patient taking differently: Inject 80 Units into the skin in the morning.  12/12/19   Laurey Morale, MD  Insulin Pen Needle (NOVOFINE) 32G X 6 MM MISC CHECK BLOOD GLUCOSE THREE TIMES DAILY Patient taking differently: 1 each by Other route 3 (three) times daily. CHECK BLOOD GLUCOSE THREE TIMES DAILY 09/03/19   Laurey Morale, MD  meclizine (ANTIVERT) 25 MG tablet Take 1 tablet (25 mg total) by mouth 3 (three) times daily as needed for dizziness. 10/21/19   Laurey Morale, MD  methylPREDNISolone (MEDROL DOSEPAK) 4 MG TBPK tablet As directed Patient not taking: Reported on 04/21/2020 02/06/20   Laurey Morale, MD  nitroGLYCERIN (NITROSTAT) 0.4 MG SL tablet Place 0.4 mg under the tongue every 5 (five) minutes as needed for chest pain.    [provider]  oxyCODONE-acetaminophen (PERCOCET) 10-325 MG tablet Take 2 tablets by mouth every 4 (four) hours as needed for pain.    [provider]  phenazopyridine (PYRIDIUM) 200 MG tablet Take 1 tablet (200 mg total) by mouth 3 (three) times daily as needed (urinary pain). 04/11/19   Laurey Morale, MD  pramipexole (MIRAPEX) 1 MG tablet TAKE 4 TABLETS BY MOUTH ONCE DAILY Patient taking differently: Take 4 mg by mouth at bedtime.  01/30/20   Laurey Morale, MD  spironolactone (ALDACTONE) 25 MG tablet TAKE 1 TABLET(25 MG) BY MOUTH TWICE DAILY Patient taking differently: Take 25 mg by mouth 2 (two) times daily.  12/08/19   Laurey Morale, MD  zolpidem (AMBIEN) 10 MG tablet TAKE 1 TABLET(10  MG) BY MOUTH AT BEDTIME AS NEEDED FOR SLEEP Patient taking differently: Take 10 mg by mouth at bedtime.  11/17/19   Laurey Morale, MD    Allergies    Lyrica [pregabalin], Aspirin, Ciprofloxacin, Clarithromycin, Doxycycline, Erythromycin, Gabapentin, Lisinopril, Macrobid [nitrofurantoin monohyd macro], Metolazone, Other, Penicillins, Sulfonamide derivatives, and Valtrex [valacyclovir hcl]  Review of Systems   Review of Systems  Constitutional: Negative for chills and fever.  HENT: Negative for ear pain and sore throat.   Eyes: Negative for pain and visual disturbance.  Respiratory: Negative for cough and shortness of breath.   Cardiovascular: Negative for chest pain and palpitations.  Gastrointestinal: Negative for abdominal pain and vomiting.  Genitourinary: Negative for dysuria and hematuria.  Musculoskeletal: Positive for arthralgias, back pain, myalgias and neck pain.  Skin: Negative for color change and rash.  Neurological: Positive for weakness and numbness.  All other systems reviewed and are negative.   Physical Exam Updated Vital Signs BP (!) 152/78   Pulse 84   Temp 98.2 F (36.8 C)   Resp 17   Ht 5' 8.5" (1.74 m)   Wt 99.8 kg   SpO2 98%   BMI 32.96 kg/m   Physical Exam Vitals and nursing note reviewed.  Constitutional:      General: She is in acute distress.     Appearance: She is well-developed.     Comments: Lying on right side with legs bent to 45 degrees  HENT:     Head: Normocephalic and atraumatic.  Eyes:     Conjunctiva/sclera: Conjunctivae normal.     Pupils: Pupils are equal, round, and reactive to light.  Cardiovascular:     Rate and Rhythm: Normal rate and regular rhythm.     Pulses: Normal pulses.  Pulmonary:     Effort: Pulmonary effort is normal. No respiratory distress.  Abdominal:  General: There is no distension.     Palpations: Abdomen is soft.  Musculoskeletal:     Cervical back: Neck supple.  Skin:    General: Skin is warm and  dry.  Neurological:     Mental Status: She is alert and oriented to person, place, and time.     Comments: No sensory deficits, no saddle anesthesia Difficulty to obtain reliable lower extremity strength exam due to pain No spinal midline tenderness  Psychiatric:        Mood and Affect: Mood normal.        Behavior: Behavior normal.     ED Results / Procedures / Treatments   Labs (all labs ordered are listed, but only abnormal results are displayed) Labs Reviewed - No data to display  EKG None  Radiology MR LUMBAR SPINE WO CONTRAST  Result Date: 05/07/2020 CLINICAL DATA:  Low back pain. Suspect cauda equina syndrome. Right leg pain and weakness. EXAM: MRI LUMBAR SPINE WITHOUT CONTRAST TECHNIQUE: Multiplanar, multisequence MR imaging of the lumbar spine was performed. No intravenous contrast was administered. COMPARISON:  Lumbar MRI 03/16/2019 FINDINGS: Segmentation:  Normal Alignment:  Normal Vertebrae: Negative for fracture or mass. Scattered fatty deposits in the bone marrow. Conus medullaris and cauda equina: Conus extends to the L1-2 level. Conus and cauda equina appear normal. Paraspinal and other soft tissues: Negative for paraspinous mass or adenopathy. No soft tissue edema or fluid collection. Disc levels: T12-L1: Mild facet degeneration. Small central disc protrusion unchanged. Negative for stenosis. L1-2: Mild disc degeneration and mild facet degeneration. No significant stenosis and no change from the prior study. L2-3: Progressive disc bulging. Diffuse endplate spurring. Moderate facet and ligamentum flavum hypertrophy bilaterally. Moderate spinal stenosis has progressed in the interval. Mild subarticular stenosis bilaterally L3-4: Progression of diffuse disc bulging. Moderate facet and ligamentum flavum hypertrophy bilaterally. Moderate spinal stenosis has progressed in the interval. Mild to moderate subarticular stenosis bilaterally. L4-5: Right laminectomy. Asymmetric disc  degeneration left greater than right. Associated spurring on the left with moderate subarticular stenosis on the left, unchanged. Spinal canal normal in size L5-S1: Right laminectomy. Disc degeneration with mild endplate spurring. Mild subarticular stenosis bilaterally. IMPRESSION: 1. Moderate spinal stenosis at L2-3 and L3-4 has progressed since the prior study due to disc bulging and spurring and posterior element hypertrophy. 2. Right laminectomy L4-5. Moderate subarticular stenosis left due to spurring, unchanged. 3. Right laminectomy L5-S1. Mild subarticular stenosis bilaterally due to spurring. Electronically Signed   By: Franchot Gallo M.D.   On: 05/07/2020 18:44    Procedures Procedures (including critical care time)  Medications Ordered in ED Medications  sodium chloride 0.9 % bolus 500 mL (500 mLs Intravenous Refused 05/07/20 1939)  HYDROmorphone (DILAUDID) injection 1 mg (1 mg Intravenous Given 05/07/20 1428)  HYDROmorphone (DILAUDID) injection 0.5 mg (0.5 mg Intravenous Given 05/07/20 2019)    ED Course  I have reviewed the triage vital signs and the nursing notes.  Pertinent labs & imaging results that were available during my care of the patient were reviewed by me and considered in my medical decision making (see chart for details).  86 yo female w/ chronic lower back pain and decompression cord surgery presenting with right leg pain, acutely worsening for the past 5 days since a bumpy ambulance ride home.  She appears to be in distress on arrival.    She has no sensory deficits in her leg but her strength exam is extremely limited by her pain.  We'll place an IV  and give 1 mg IV dilaudid.  I think with her surgical history, she will need an MRI of the lumbar spine to evaluate for cord compression.  Clinical Course as of May 08 32  Fri May 07, 2020  1646 Pain significantly improved.  She has 4/5 strength in her hip flexion and knee extension on the right side, as well as plantar  flexion.  Still limited by pain on exam.  She reports parethesias diffusely down her lateral right leg.  Still awaiting MRI.   [MT]  2620 At MRI now   [MT]  1929 She reports her pain is improved and she is feeling better.  She feels like her leg "isn't numb anymore," but still has radiculopathy.  I explained that I do not see any significant changes on her MRI, and I feel comfortable sending her home if her pain is better controlled.  She is on hydromorphone 4 mg PO at home and can continue this.  I also spoke to Donald Siva her son who agrees with this plan, but has requested a home health CM consult and evaluation, which we can do.  He states she is essentially bed-bound, and they need more help caring for her at home.  She has initiated conversations with hospice but is not yet on hospice.   [MT]    Clinical Course User Index [MT] Sharlot Sturkey, Carola Rhine, MD    Final Clinical Impression(s) / ED Diagnoses Final diagnoses:  Chronic right-sided low back pain with right-sided sciatica    Rx / DC Orders ED Discharge Orders    None       Langston Masker Carola Rhine, MD 05/08/20 [phone]

## 2020-05-07 NOTE — ED Triage Notes (Signed)
Per EMS- Patient c/o worsening right lower back pain that radiates down the right leg. Pain 6/10.

## 2020-05-07 NOTE — Discharge Instructions (Addendum)
I've asked our case manager to contact you about home health nursing.  Please call your spinal doctor's office first thing on Monday about your worsening back pain.  You can continue taking the hydromorphone 4 mg that was prescribed.  This is roughly the same dose as the hydromorphone we gave you in the IV in the ER.  Remember to take stool softeners with this and drink lots of water to avoid constipation.

## 2020-05-10 ENCOUNTER — Telehealth: Payer: Self-pay | Admitting: Family Medicine

## 2020-05-10 NOTE — Telephone Encounter (Signed)
I do not think she is a candidate for Hospice but  Palliative Care would be very appropriate

## 2020-05-10 NOTE — Telephone Encounter (Signed)
Admission to Hospice requires the doctor to certify that the patient has only 6 months to live, and I do NOT feel that way. However Palliative Care affords all the same advantages like nursing visits, social workers, PT, etc that may be needed. I am happy to set this up

## 2020-05-10 NOTE — Telephone Encounter (Signed)
Pt son called back to say now she is vomiting a black substances. Said she needs something for that. He said the nurse was aware.  Son said mom needs to go to hospice  Please call son [name] [phone]

## 2020-05-10 NOTE — Telephone Encounter (Signed)
Amy from Nurse Triage received a call from a social worker, [facility], that stated they need orders for hospice due to the pt being in the hospital twice recently and pt was told there was nothing else they could do. Millie gave Amy the pt's son info.   Donald Siva (son-on the [facility]) can be reached at [phone]

## 2020-05-11 ENCOUNTER — Telehealth: Payer: Self-pay | Admitting: Family Medicine

## 2020-05-11 NOTE — Telephone Encounter (Signed)
Spoke with the patients son. He would like to start palliative care.

## 2020-05-11 NOTE — Telephone Encounter (Signed)
As long as she refuses to go to the ER she should try to get down oral fluids at home as best she can.

## 2020-05-11 NOTE — Telephone Encounter (Signed)
Left message for patients son to call back.

## 2020-05-11 NOTE — Telephone Encounter (Signed)
Patient is having chest pains, can't keep fluids or food down, has heartburn, so weak that she can't get out of bed, to get her to the hospital in the last week an ambulance has had to come out and lift/transport her there.  I spoke to Dr. Sarajane Jews when patient's son [name] called and Dr Sarajane Jews stated he wanted the patient to go to the ER for fluids so she didn't get dehydrated.  Patient is refusing to go to ER, said she had to wait there for 10-12 hrs the last time she went last week.  According to the patient's son they told her there was nothing else they could do. [name] states patient is requesting Hospice to come out as soon as possible.

## 2020-05-11 NOTE — Telephone Encounter (Signed)
Referral to palliative care has been place via fax. Nothing further needed.

## 2020-05-11 NOTE — Telephone Encounter (Signed)
Okay. Please refer her to palliative care

## 2020-05-11 NOTE — Telephone Encounter (Signed)
Spoke with the patients son. He would like to start palliative care. This request was sent to Dr. Sarajane Jews in another phone encounter.

## 2020-05-13 ENCOUNTER — Other Ambulatory Visit: Payer: Medicare PPO | Admitting: Hospice

## 2020-05-13 ENCOUNTER — Other Ambulatory Visit: Payer: Self-pay

## 2020-05-13 DIAGNOSIS — Z515 Encounter for palliative care: Secondary | ICD-10-CM | POA: Diagnosis not present

## 2020-05-13 DIAGNOSIS — M5136 Other intervertebral disc degeneration, lumbar region: Secondary | ICD-10-CM

## 2020-05-13 NOTE — Telephone Encounter (Signed)
Spoke with the patient. She is aware that we have placed a referral for palliative care and that she does not need hospice care at this time. She is aware to drink plenty of fluids. Nothing further needed.

## 2020-05-13 NOTE — Progress Notes (Addendum)
[facility] Palliative Care Consult Note Telephone: [phone]  Fax: [phone]  PATIENT NAME: Melanie Mcdonald DOB: [birth year] MRN: [id number]  PRIMARY CARE PROVIDER:   Laurey Morale, MD  REFERRING PROVIDER: Alysia Penna MD  RESPONSIBLE PARTY:Self [phone] Contact: Donald Siva, lives at home with [phone]    RECOMMENDATIONS/PLAN:  Advance Care Planning/Goals of Care: Marland Kitchen Visit consisted of building trust and follow-up on palliative care.   [name] and Helene Kelp were present during visit.  Patient affirmed that she is a DO NOT RESUSCITATE.  Extensive discussions today on medical orders for scope of treatment (MOST).  Most selections today include comfort measures antibiotics if indicated IV fluids if indicated no feeding tube.  Signed MOST form at home; same document uploaded to epic today.  Patient continues on ongoing decline in functional status.  4 months ago she was PPS 50%, currently PPS 30%, bedbound, nonambulatory, difficulty swallowing food; only able to take in fluids applesauce and Jell-O.  Per [name], patient was 245 pounds 6 months ago, now down to 205 pounds; this is a 16.6% decrease in body weight  --  abnormal weight loss.  Patient with ongoing weakness, worsening low back pain currently on hydrocodone; patient not wanting to be bothered, refuses to take her medications sometimes and declines visit to the hospital.  Patient and family on board for hospice service.  Therapeutic presence and ample emotional support provided.  CMO of [facility] endorses patient is eligible for hospice service.  NP will reach out to PCP on hospice eligibility via secure chat and telephone call.  Hospice referral will be initiated today.  I spentone hour and46 minutes providing this consultation; time includes time spent with patient/family, provider coordination, chart review and documentation. More than 50% of the time in this consultation was  spent on coordinating communication  Oasis a 86 y.o.year oldfemalewith multiple medical problems including COPD, coronaryarterydisease, chronic diastolic CHF, chronic back pain, Type 2 DM. Palliative Care was asked to help address goals of care.   CODE STATUS: DNR  PPS: 30% HOSPICE ELIGIBILITY/DIAGNOSIS: TBD  PAST MEDICAL HISTORY:  Past Medical History:  Diagnosis Date   Abdominal pain, unspecified site 03/09/2014   Acute on chronic diastolic CHF (congestive heart failure), NYHA class 3 ([place]) 11/03/2014   Acute on chronic diastolic heart failure   Acute respiratory failure ([place]) 06/17/2018   ANEMIA 01/30/2008   ARTHRITIS 12/11/2007   BACK PAIN, CHRONIC 06/23/2008   Backache 06/23/2008   Qualifier: Diagnosis of  By: Niel Hummer MD, Willie R    BREAST CYST, RIGHT 12/17/2007   Cancer ([place])    Candidiasis of genitalia in female 07/20/2017   Cellulitis of left leg 08/14/2011   Cellulitis of right foot 06/13/2016   Cervicogenic headache 06/14/2015   Chest pain 06/08/2011   Atypical chest pain   Chest pain 07/20/2017   CHF (congestive heart failure) (HCC)    CHF (congestive heart failure), NYHA class II, chronic, systolic ([place]) 01/08/1790   Colon polyps    FRAGMENTS OF HYPERPLASTIC POLYP   CONTACT DERMATITIS 03/10/2009   COPD (chronic obstructive pulmonary disease) ([place])    CORONARY ARTERY DISEASE 03/01/2007   had a normal Myoview stress test 07-13-11   Coronary atherosclerosis 03/01/2007   Qualifier: Diagnosis of  By: Aurther Loft    CVA (cerebral vascular accident) ([place]) 07/20/2017   CYSTOCELE WITHOUT MENTION UTERINE PROLAPSE LAT 09/12/2010   DEGENERATIVE JOINT DISEASE 08/07/2007  DEPRESSION 03/01/2007   Depression, recurrent ([place]) 03/01/2007   Qualifier: Diagnosis of  By: Cori Razor RN, Carson G    DIABETES MELLITUS, TYPE II 08/26/2008   Diabetic polyneuropathy associated with diabetes mellitus due to underlying  condition ([place]) 06/14/2015   Diarrhea 08/15/2011   Diverticulosis of colon (without mention of hemorrhage)    Dizziness 02/10/2014   DOE (dyspnea on exertion) 11/03/2014   Edema 09/02/2008   Centricity Description: EDEMA Qualifier: Diagnosis of  By: Niel Hummer MD, Lorinda Creed  Centricity Description: PERIPHERAL EDEMA Qualifier: Diagnosis of  By: Niel Hummer MD, Willie R    Esophageal reflux 11/06/2007   Essential hypertension 03/01/2007   Qualifier: Diagnosis of  By: Cori Razor RN, Mikal Plane    Gait instability 11/13/2014   Gout 06/07/2016   Headache 12/30/2013   HIP PAIN 09/15/2010   Qualifier: Diagnosis of  By: Elease Hashimoto MD, Bruce     HYPERKERATOSIS 06/02/2009   HYPERLIPIDEMIA 08/26/2008   Hyperlipidemia 08/26/2008   Qualifier: Diagnosis of  By: Cori Razor RN, Mikal Plane    HYPERTENSION 03/01/2007   Hypokalemia 08/15/2011   Insomnia 12/11/2007   Irritable bowel syndrome 01/26/2009   LABYRINTHITIS 11/03/2009   Labyrinthitis 11/03/2009   Qualifier: Diagnosis of  By: Niel Hummer MD, Willie R    Leg swelling 04/05/2016   Memory loss 12/30/2013   Narcolepsy without cataplexy 08/26/2008   Qualifier: Diagnosis of  By: Niel Hummer MD, Willie R    Narcolepsy without cataplexy(347.00) 08/26/2008   NEPHROLITHIASIS 04/29/2009   OBESITY 03/01/2007   Obstructive apnea 11/03/2014   Osteoarthritis 03/15/2018   Pain in joint, ankle and foot 12/24/2015   PERIPHERAL VASCULAR DISEASE 01/26/2009   Peripheral vascular disease ([place]) 01/26/2009   Qualifier: Diagnosis of  By: Niel Hummer MD, [place] history of colonic polyps 03/09/2014   Stroke ([place])    SYNCOPE 08/26/2008   had brain MRI on 06-28-12 showing only chronic microvascular ischemia and atrophy    SYNDROME, RESTLESS LEGS 03/01/2007   Qualifier: Diagnosis of  By: Cori Razor RN, Mikal Plane    Thyroid nodule 04/28/2014   TIA (transient ischemic attack) 11/13/2014   TRANSIENT ISCHEMIC ATTACK 10/20/2009   had normal brain MRA with patent  vertebrals and carotids 06-28-12   Uncontrolled type 2 diabetes mellitus with hyperglycemia, with long-term current use of insulin ([place]) 10/09/2016   Weakness generalized 11/13/2014    SOCIAL HX:  Social History   Tobacco Use   Smoking status: Never Smoker   Smokeless tobacco: Never Used  Substance Use Topics   Alcohol use: No    Alcohol/week: 0.0 standard drinks    ALLERGIES:  Allergies  Allergen Reactions   Lyrica [Pregabalin] Other (See Comments)    dizziness   Aspirin Other (See Comments)    Abdominal pain   Ciprofloxacin Nausea And Vomiting    Made pt very sick on stomach   Clarithromycin Other (See Comments)    Made stomach "burn"   Doxycycline Itching   Erythromycin Nausea And Vomiting   Gabapentin     dizziness   Lisinopril Cough   Macrobid [Nitrofurantoin Monohyd Macro] Nausea And Vomiting   Metolazone Other (See Comments)    Pt stated this made her B/P drop   Other Nausea And Vomiting    Pt cannot have any of the -mycin(s)   Penicillins Swelling    From childhood: swelling @ injection site,childhood allergy Has patient had a PCN reaction causing immediate rash, facial/tongue/throat swelling, SOB or lightheadedness with hypotension: Yes Has  patient had a PCN reaction causing severe rash involving mucus membranes or skin necrosis: No Has patient had a PCN reaction that required hospitalization: No Has patient had a PCN reaction occurring within the last 10 years: No If all of the above answers are "NO", then may proceed with Cephalosporin use.   Sulfonamide Derivatives Nausea And Vomiting   Valtrex [Valacyclovir Hcl] Nausea Only     PERTINENT MEDICATIONS:  Outpatient Encounter Medications as of 05/13/2020  Medication Sig   acetaminophen (TYLENOL) 500 MG tablet Take 1,000 mg by mouth every 4 (four) hours as needed for moderate pain. Taking with oxycodone   albuterol (VENTOLIN HFA) 108 (90 Base) MCG/ACT inhaler Inhale 2 puffs into the lungs  every 6 (six) hours as needed for wheezing or shortness of breath.   amitriptyline (ELAVIL) 25 MG tablet Take 1 tablet (25 mg total) by mouth at bedtime.   blood glucose meter kit and supplies KIT Use as directed.   clopidogrel (PLAVIX) 75 MG tablet TAKE 1 TABLET BY MOUTH EVERY DAY (Patient taking differently: Take 75 mg by mouth at bedtime. )   Continuous Blood Gluc Receiver (FREESTYLE LIBRE 14 DAY READER) DEVI 1 Device by Does not apply route daily. Use device daily as directed   Continuous Blood Gluc Sensor (FREESTYLE LIBRE 14 DAY SENSOR) MISC 1 application by Does not apply route every 14 (fourteen) days.   diclofenac Sodium (VOLTAREN) 1 % GEL Apply 2 g topically 4 (four) times daily.   estradiol (ESTRACE) 0.1 MG/GM vaginal cream Place 1 Applicatorful vaginally at bedtime.   furosemide (LASIX) 40 MG tablet Take 2 tablets (80 mg total) by mouth 2 (two) times daily. TAKE 2 TABLETS BY MOUTH TWICE DAILY (Patient taking differently: Take 80 mg by mouth 2 (two) times daily. )   glucose blood (ONE TOUCH ULTRA TEST) test strip 1 each by Other route as needed for other. Use as instructed   HUMALOG KWIKPEN 100 UNIT/ML KwikPen Inject 0.2 mLs (20 Units total) into the skin 3 (three) times daily before meals.   HYDROcodone-homatropine (HYDROMET) 5-1.5 MG/5ML syrup Take 5 mLs by mouth every 4 (four) hours as needed. (Patient not taking: Reported on 04/21/2020)   insulin degludec (TRESIBA FLEXTOUCH) 100 UNIT/ML FlexTouch Pen INJECT 80 UNITS INTO THE SKIN EVERY MORNING (Patient taking differently: Inject 80 Units into the skin in the morning. )   Insulin Pen Needle (NOVOFINE) 32G X 6 MM MISC CHECK BLOOD GLUCOSE THREE TIMES DAILY (Patient taking differently: 1 each by Other route 3 (three) times daily. CHECK BLOOD GLUCOSE THREE TIMES DAILY)   meclizine (ANTIVERT) 25 MG tablet Take 1 tablet (25 mg total) by mouth 3 (three) times daily as needed for dizziness.   methylPREDNISolone (MEDROL DOSEPAK) 4 MG  TBPK tablet As directed (Patient not taking: Reported on 04/21/2020)   nitroGLYCERIN (NITROSTAT) 0.4 MG SL tablet Place 0.4 mg under the tongue every 5 (five) minutes as needed for chest pain.   oxyCODONE-acetaminophen (PERCOCET) 10-325 MG tablet Take 2 tablets by mouth every 4 (four) hours as needed for pain.   phenazopyridine (PYRIDIUM) 200 MG tablet Take 1 tablet (200 mg total) by mouth 3 (three) times daily as needed (urinary pain).   pramipexole (MIRAPEX) 1 MG tablet TAKE 4 TABLETS BY MOUTH ONCE DAILY (Patient taking differently: Take 4 mg by mouth at bedtime. )   spironolactone (ALDACTONE) 25 MG tablet TAKE 1 TABLET(25 MG) BY MOUTH TWICE DAILY (Patient taking differently: Take 25 mg by mouth 2 (two) times daily. )  zolpidem (AMBIEN) 10 MG tablet TAKE 1 TABLET(10 MG) BY MOUTH AT BEDTIME AS NEEDED FOR SLEEP (Patient taking differently: Take 10 mg by mouth at bedtime. )   No facility-administered encounter medications on file as of 05/13/2020.    Teodoro Spray, NP

## 2020-05-14 ENCOUNTER — Telehealth: Payer: Self-pay | Admitting: Family Medicine

## 2020-05-14 MED ORDER — ONDANSETRON HCL 8 MG PO TABS
8.0000 mg | ORAL_TABLET | Freq: Four times a day (QID) | ORAL | 5 refills | Status: DC | PRN
Start: 2020-05-14 — End: 2022-12-20

## 2020-05-14 NOTE — Telephone Encounter (Signed)
Roland Earl will be going out today (05/14/2020) to do a hospice evaluation and would like to know if Dr. Sarajane Jews would be the attending physician?

## 2020-05-14 NOTE — Telephone Encounter (Signed)
Call in Zofran 8 mg to take every 6 hours as needed for nausea or vomiting, #60 with 5 rf

## 2020-05-14 NOTE — Telephone Encounter (Signed)
Called Geri back. She is aware that Dr. Sarajane Jews will be the attending physician

## 2020-05-14 NOTE — Telephone Encounter (Signed)
Nurse Triage stated pt has been vomiting blood for the past week- she refuses to go back to the ED room because she went twice last week and the second time she waiting many hours and did not see a doc. Pt wants something called in for the vomiting because she thinks that the morphine she got in the hospital is causing her to vomit. She also says the vomiting has eaten her skin, ruined her vocal cords and more.  Spoke to [place] and she did inform me that they have ordered palliative care since she is refusing to go to the ED room and will send this message back for Dr. Sarajane Jews to evaluate if he can send anything for the vomiting.   Pt would like a call back at [place], [place] Phone:  [phone]  Fax:  [phone]

## 2020-05-14 NOTE — Telephone Encounter (Signed)
Rx sent in. Spoke with the patient. She is aware. 

## 2020-05-17 ENCOUNTER — Telehealth: Payer: Self-pay | Admitting: *Deleted

## 2020-05-17 NOTE — Telephone Encounter (Signed)
Patient called after hours line on 05/14/2020. Patient reports she vomiting for a week and is taking morphine for back pain.  She has blisters on mouth, in mouth and is now vomiting blood. Has been to the ER [facility]) 2x. Patient states she is not going back to ER, had to wait 9 hr last time. Wants to talk about hospice.

## 2020-05-17 NOTE — Telephone Encounter (Signed)
error 

## 2020-05-17 NOTE — Telephone Encounter (Signed)
Nicolasa Ducking from [facility] hospice stated pt has not had a bowel movement in five days she takes Cena 8.6 2 tabs a day but has not been effective and they want to try Murelax 1 cap full once a day as needed for constipation. She needs verbal orders okaying the Murelax and if it can be called into [facility]?   Pt did disclose to her that she has only been taking one and she educated her on taking it correctly but the pt would like to try the Murelax instead. Hermina Staggers also stated that the acid has been taken care of by having the pt take her px for famotidime 20 mg twice a day-son is going to pick it up. Pt did say when she was vomiting she had blood but Shawana thinks the acid was so severe that it caused it but it has been resolved.   Hermina Staggers can be reached at [phone] -ok to leave a detailed VM  [id number] - [place], [place] Phone:  [phone]  Fax:  [phone]

## 2020-05-17 NOTE — Telephone Encounter (Signed)
Please call Hospice and see where they are in the process

## 2020-05-18 DIAGNOSIS — E46 Unspecified protein-calorie malnutrition: Secondary | ICD-10-CM

## 2020-05-18 MED ORDER — OMEPRAZOLE 40 MG PO CPDR
40.0000 mg | DELAYED_RELEASE_CAPSULE | Freq: Every day | ORAL | 3 refills | Status: DC
Start: 2020-05-18 — End: 2020-05-24

## 2020-05-18 NOTE — Addendum Note (Signed)
Addended by: Rebecca Eaton on: 05/18/2020 02:40 PM   Modules accepted: Orders

## 2020-05-18 NOTE — Telephone Encounter (Signed)
Rx sent in. Spoke with the patient. She is aware that omeprozole has been sent in and to take 1 full scoop of miralax.

## 2020-05-18 NOTE — Telephone Encounter (Signed)
Patient called after hours line again on 05/17/2020. Patient reports she has been coughing up blood for two days. It is eating mouth and throat. Bile has eating lip too. Medication for gastric juices might be needed. Has been in bed for two weeks now. Sore on outside of mouth. Constipation: last BM 4 days ago. Was taking Morphine d/t back and leg pain. Spoke with a nurse who stated MD would call in Miralax.

## 2020-05-18 NOTE — Telephone Encounter (Signed)
Call in Omeprazole 40 mg to take daily, #90 with 3 rf. Also start giving her a full scoop of Miralax daily with a glass of water

## 2020-05-18 NOTE — Telephone Encounter (Signed)
Spoke with hospice. They are still in the process of trying to get get admitted. Please advise in the mean time.

## 2020-05-24 ENCOUNTER — Telehealth: Payer: Self-pay | Admitting: Family Medicine

## 2020-05-24 DIAGNOSIS — K219 Gastro-esophageal reflux disease without esophagitis: Secondary | ICD-10-CM

## 2020-05-24 MED ORDER — OMEPRAZOLE 40 MG PO CPDR: 40.0000 mg | DELAYED_RELEASE_CAPSULE | Freq: Every day | ORAL | 3 refills | Status: DC

## 2020-05-24 NOTE — Telephone Encounter (Signed)
Please advise 

## 2020-05-24 NOTE — Telephone Encounter (Signed)
Medication sent to pharmacy  

## 2020-05-24 NOTE — Telephone Encounter (Signed)
Call in Omeprazole 40 mg daily ,#90 with 3 rf

## 2020-05-24 NOTE — Telephone Encounter (Signed)
Patient is having increased reflux-- she is taking 40 mg of Pepcid each day and it isn't helping.   Patient states she is only sleeping about 4 hours a night.  She is taking 10mg  of Ambien at night, however it isn't working now.

## 2020-05-27 ENCOUNTER — Other Ambulatory Visit: Payer: Self-pay | Admitting: Family Medicine

## 2020-06-01 ENCOUNTER — Telehealth: Payer: Self-pay | Admitting: Family Medicine

## 2020-06-01 NOTE — Telephone Encounter (Signed)
The 10 mg dose is the highest dose she can take. Tell her to take 20 mg of melatonin (OTC) with the Zolpidem

## 2020-06-01 NOTE — Telephone Encounter (Signed)
Pt is calling to see if she can get the highest strength on her medication   zolpidem (AMBIEN) 10 MG tablet   The dose she takes now no longer works. She takes it and sleeps for about 2 hours and she is wide awake after.  Please advise

## 2020-06-01 NOTE — Telephone Encounter (Signed)
Please advise 

## 2020-06-02 ENCOUNTER — Telehealth: Payer: Self-pay | Admitting: Family Medicine

## 2020-06-02 NOTE — Telephone Encounter (Signed)
Spoke with the patient. She is aware that she is on the highest does of Ambien we can give her and to take 20mg  along with her Ambien at night to help her sleep. Nothing further needed.

## 2020-06-02 NOTE — Telephone Encounter (Signed)
Last filled 11/17/2019 Last OV 03/22/2020  Ok to fill?

## 2020-06-02 NOTE — Telephone Encounter (Signed)
Pt needs a refill on her medication   zolpidem (AMBIEN) 10 MG tablet     [facility] #[id number] - [place], [place] [address]  [place], [place] [address]  Phone:  [phone] Fax:  [phone]

## 2020-06-03 ENCOUNTER — Ambulatory Visit (INDEPENDENT_AMBULATORY_CARE_PROVIDER_SITE_OTHER): Payer: Medicare PPO | Admitting: *Deleted

## 2020-06-03 DIAGNOSIS — I639 Cerebral infarction, unspecified: Secondary | ICD-10-CM

## 2020-06-03 LAB — CUP PACEART REMOTE DEVICE CHECK
Date Time Interrogation Session: 20210901232502
Implantable Pulse Generator Implant Date: 20181022

## 2020-06-03 MED ORDER — ZOLPIDEM TARTRATE 10 MG PO TABS: ORAL_TABLET | ORAL | 5 refills | Status: DC

## 2020-06-03 NOTE — Telephone Encounter (Signed)
Done

## 2020-06-03 NOTE — Telephone Encounter (Signed)
Spoke with the patient. She is aware her medication has been sent in.

## 2020-06-08 ENCOUNTER — Telehealth: Payer: Self-pay | Admitting: *Deleted

## 2020-06-08 NOTE — Telephone Encounter (Signed)
Zolpidem filled 06/03/2020 Oxycodone last filled by a historical provider.

## 2020-06-08 NOTE — Progress Notes (Signed)
[facility] Summary Report / Loop Recorder 

## 2020-06-08 NOTE — Telephone Encounter (Signed)
Patient called after hours line. Patient reports she is completely out of 2 meds and needs new RXs. Zolpidem and Oxycodone 120 tablets. constant pain in RT leg. [facility] on [address];  Clinic RN does see documentation where Ambien was refilled on 06/03/2020.

## 2020-06-09 ENCOUNTER — Telehealth: Payer: Self-pay | Admitting: Family Medicine

## 2020-06-09 NOTE — Telephone Encounter (Signed)
The Zolpidem has already been refilled. She will need a PMV for any refills on pain medication (virtual is ok)

## 2020-06-09 NOTE — Telephone Encounter (Signed)
I spoke with pt. Virtual visit made for tomorrow at 10:45am.

## 2020-06-10 ENCOUNTER — Telehealth (INDEPENDENT_AMBULATORY_CARE_PROVIDER_SITE_OTHER): Payer: Medicare PPO | Admitting: Family Medicine

## 2020-06-10 ENCOUNTER — Encounter: Payer: Self-pay | Admitting: Family Medicine

## 2020-06-10 DIAGNOSIS — F1190 Opioid use, unspecified, uncomplicated: Secondary | ICD-10-CM

## 2020-06-10 DIAGNOSIS — M8949 Other hypertrophic osteoarthropathy, multiple sites: Secondary | ICD-10-CM | POA: Diagnosis not present

## 2020-06-10 DIAGNOSIS — M159 Polyosteoarthritis, unspecified: Secondary | ICD-10-CM

## 2020-06-10 IMAGING — CT CT CERVICAL SPINE WITHOUT CONTRAST
4 of 7 series · 13 of 33 positions shown, 14 images · non-contrast
Comparison: CT, MRI and CT angiography 11/21/2018

CLINICAL DATA: Fall, ataxia post trauma

EXAM:
CT HEAD WITHOUT CONTRAST
CT CERVICAL SPINE WITHOUT CONTRAST
TECHNIQUE: Multidetector CT imaging of the head and cervical spine was
performed following the standard protocol without intravenous
contrast. Multiplanar CT image reconstructions of the cervical spine
were also generated.

[Series 9: c spine soft · axial · 0.29mm/px · z∈[-254,-156]mm · 4 of 83 slices shown]
[im 17/83  soft-tissue]
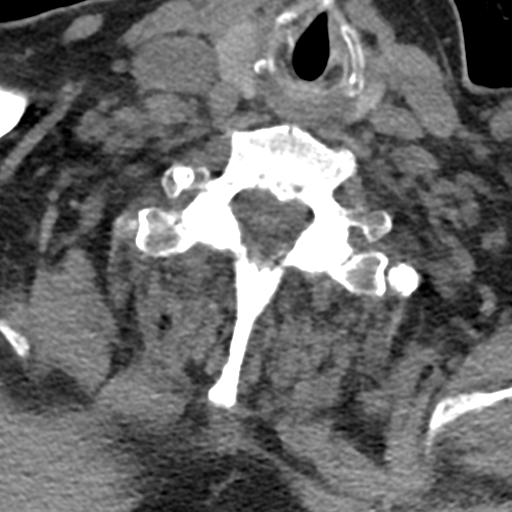
[im 33/83  soft-tissue]
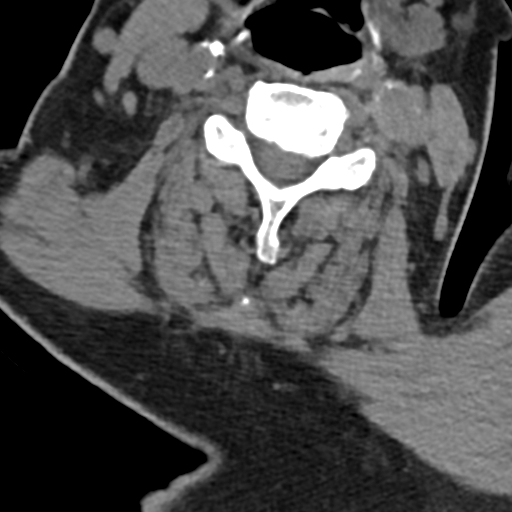
[im 50/83  soft-tissue]
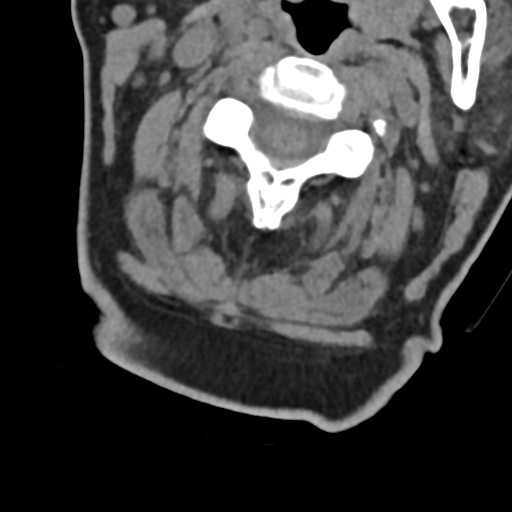
[im 66/83  soft-tissue]
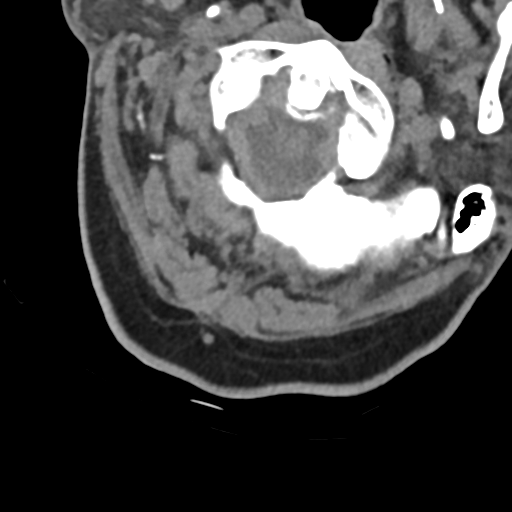

[Series 11: sagittal bone · sagittal · 0.26mm/px · 4 of 61 slices shown]
[im 13/61  bone]
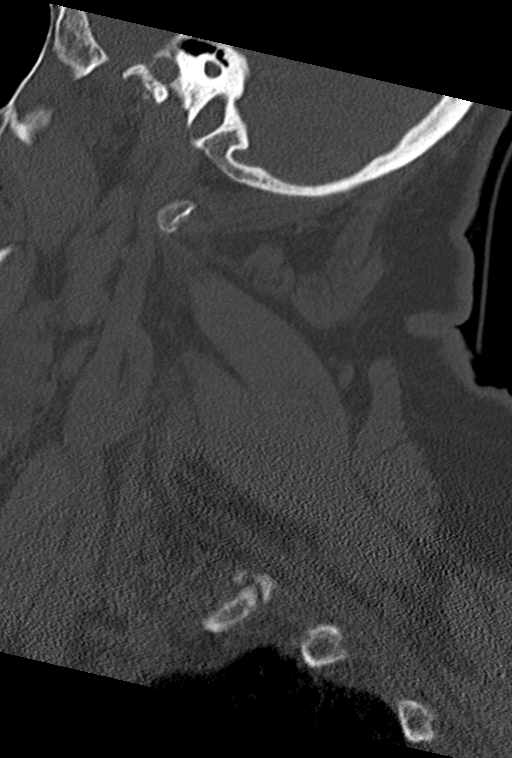
[im 25/61  bone]
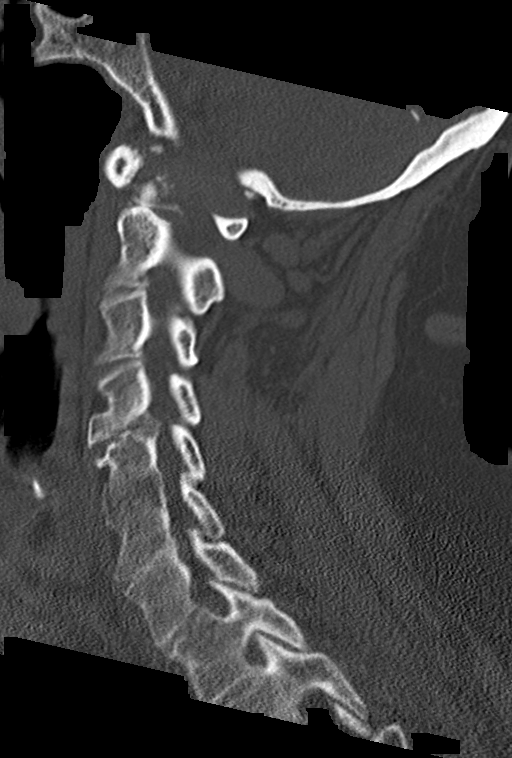
[im 37/61  bone]
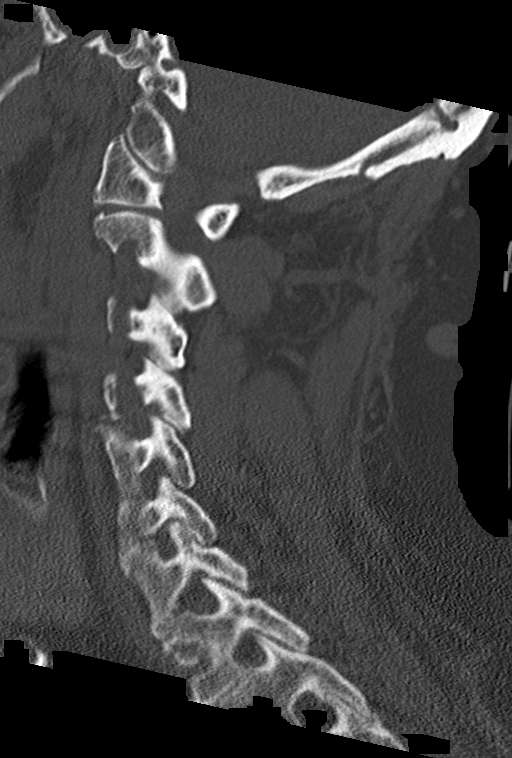
[im 49/61  bone]
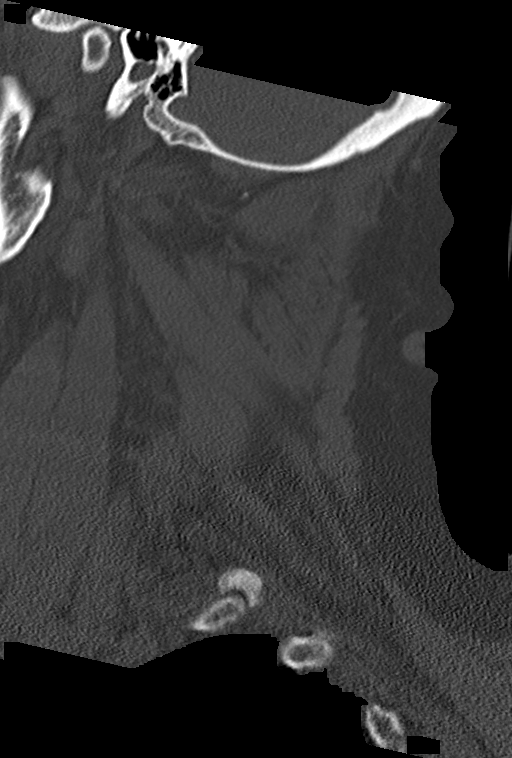

[Series 12: coronal bone · coronal · 0.24mm/px · 1 of 68 slices shown]
[im 34/68  bone]
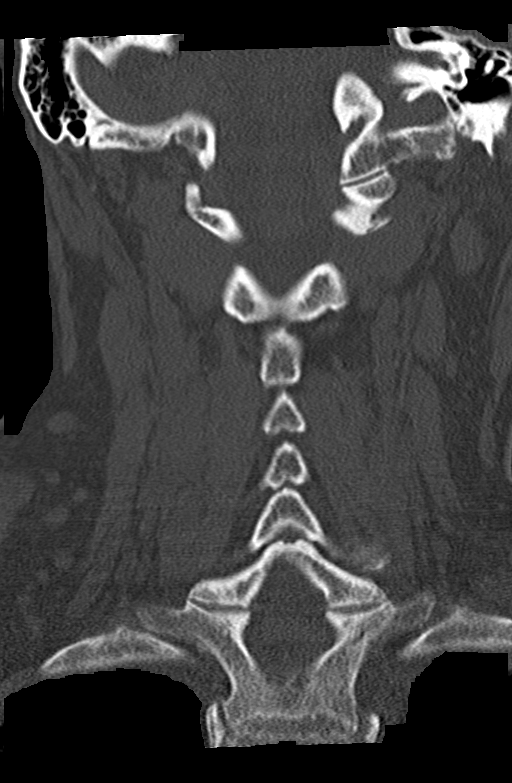

[Series 13: orthogonal bone · axial · 0.23mm/px · z∈[-263,-157]mm · 4 of 92 slices shown, 5 images]
[im 19/92  soft-tissue]
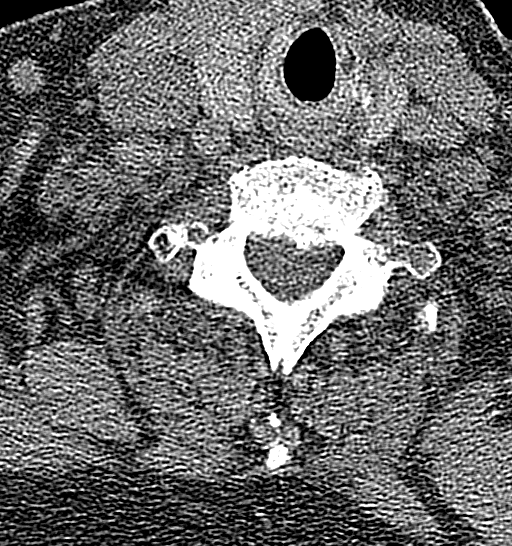
[im 19/92  bone]
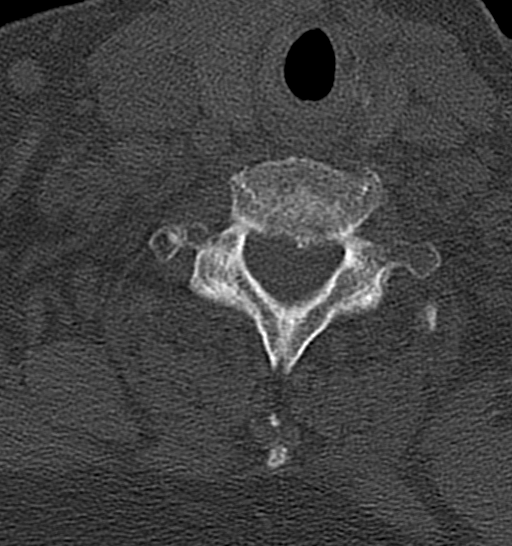
[im 37/92  bone]
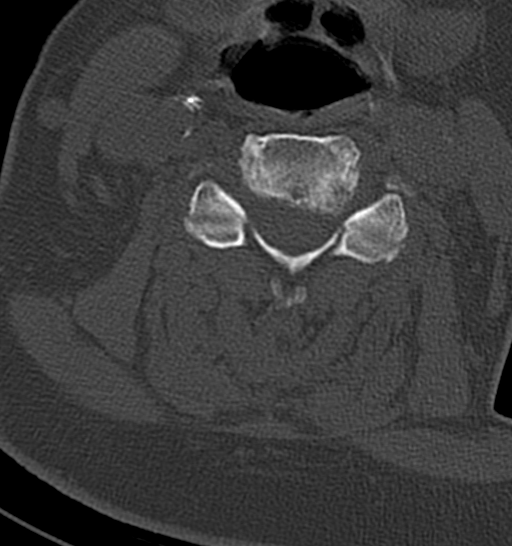
[im 55/92  bone]
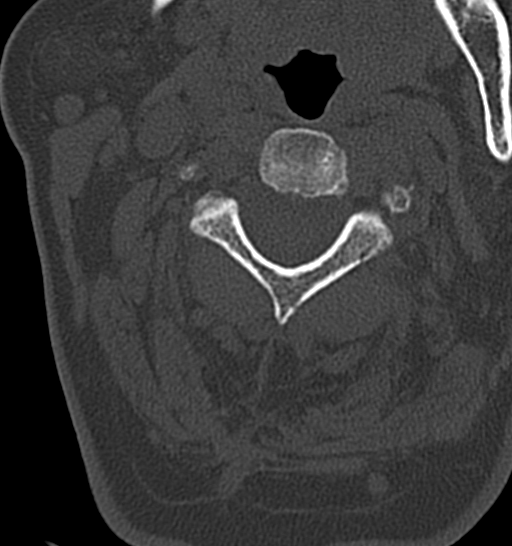
[im 73/92  bone]
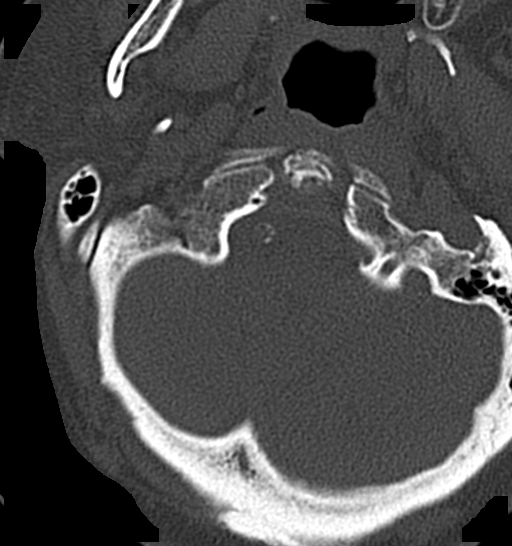

[13 of 33 positions shown; findings below may reference images not displayed]

FINDINGS: CT HEAD FINDINGS

Brain: Stable region of gliosis in the left cerebellum. No evidence
of acute infarction, hemorrhage, hydrocephalus, extra-axial
collection or mass lesion/mass effect. Symmetric prominence of the
ventricles, cisterns and sulci compatible with parenchymal volume
loss. Patchy areas of white matter hypoattenuation are most
compatible with chronic microvascular angiopathy.

Vascular: Atherosclerotic calcification of the carotid siphons and
intradural vertebral arteries.

Skull: No calvarial fracture or suspicious osseous lesion. No scalp
swelling or hematoma.

Sinuses/Orbits: Paranasal sinuses and mastoid air cells are
predominantly clear. Orbital structures are unremarkable aside from
prior lens extractions.

Other: None.

CT CERVICAL SPINE FINDINGS

Alignment: Preservation of the normal cervical lordosis without
traumatic listhesis. No abnormal facet widening. Normal
configuration of the craniocervical and atlantoaxial articulations
albeit with arthrosis and calcific pannus formation.

Skull base and vertebrae: No acute fracture. No primary bone lesion
or focal pathologic process.

Soft tissues and spinal canal: No pre or paravertebral fluid or
swelling. No visible canal hematoma.

Disc levels: Diffuse intervertebral disc height loss is maximal
C4-C7 with nearly complete effacement of the intervertebral disc
spaces and spondylitic endplate changes with posterior disc
osteophyte complexes which result in at most mild spinal canal
stenosis. There is moderate foraminal stenosis on the left at C5-6.
No other significant spinal canal or foraminal stenosis is seen in
the visualized levels of the spine.

Upper chest: No acute abnormality in the upper chest or imaged lung
apices.

Other: Atherosclerotic calcification of the carotid bifurcations.
IMPRESSION: 1. No acute intracranial abnormality. Stable gliosis in the left
cerebellum.
2. Diffuse parenchymal volume loss and chronic microvascular
ischemic white matter disease.
3. No acute cervical spine fracture.
4. Cervical spondylitic changes as detailed above.
5. Extensive atheromatous plaque in the carotid bifurcations.

## 2020-06-10 IMAGING — CR RIGHT ELBOW - COMPLETE 3+ VIEW
4 series · 4 of 4 positions shown · non-contrast
Comparison: None.

CLINICAL DATA: Fall, right elbow pain.

EXAM:
RIGHT ELBOW - COMPLETE 3+ VIEW

[x elbow obl right (1 of 4)]
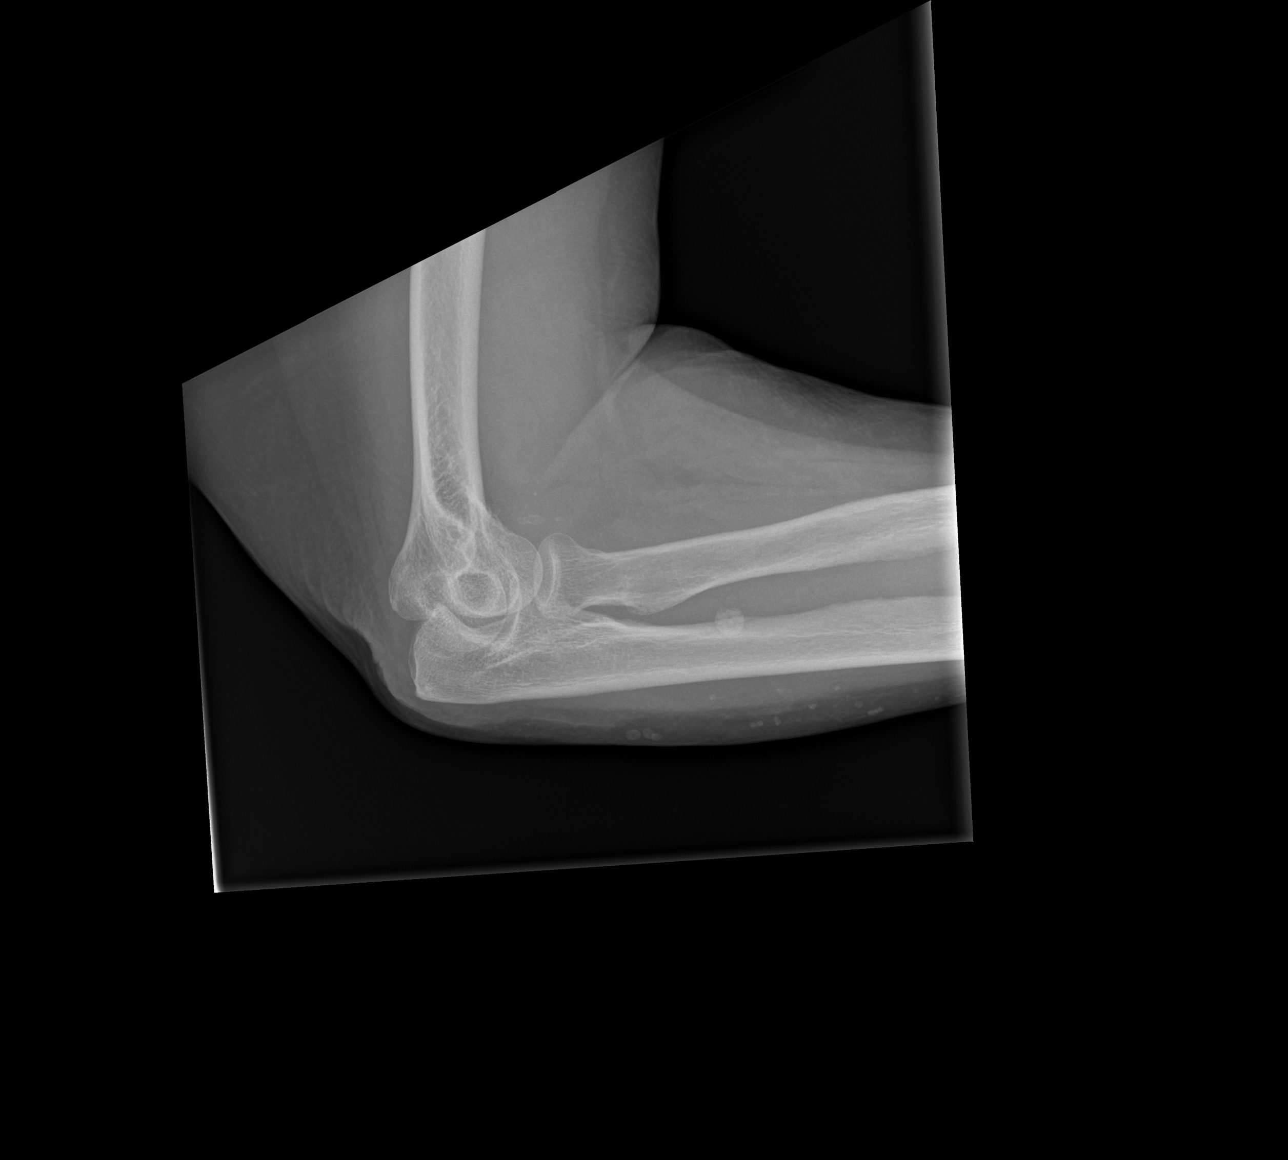

[x elbow obl right (2 of 4)]
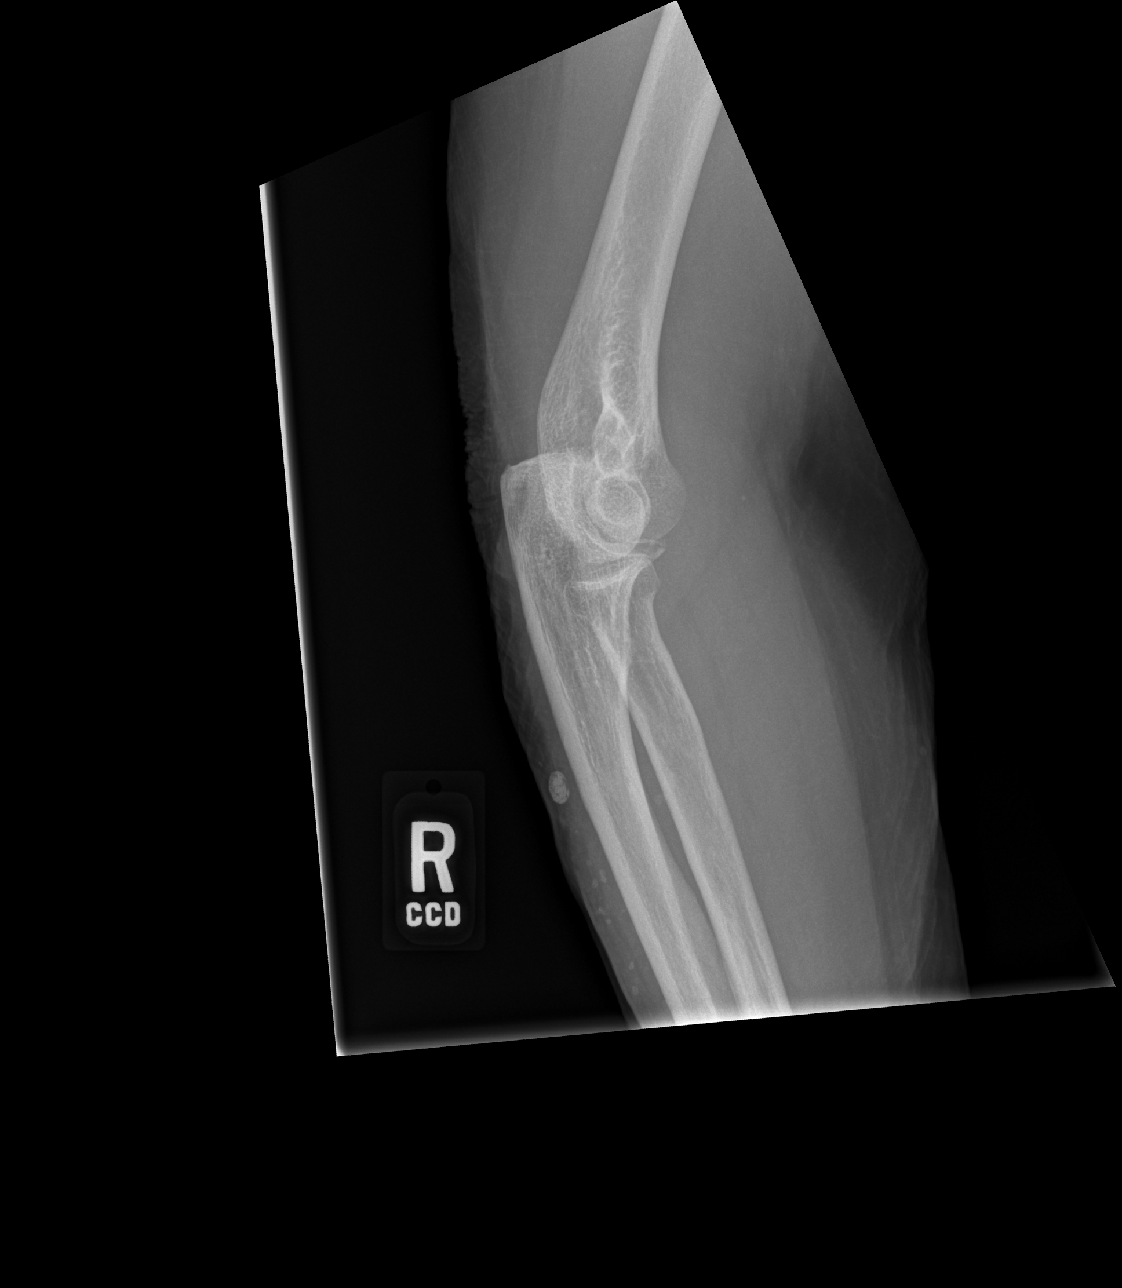

[x elbow obl right (3 of 4)]
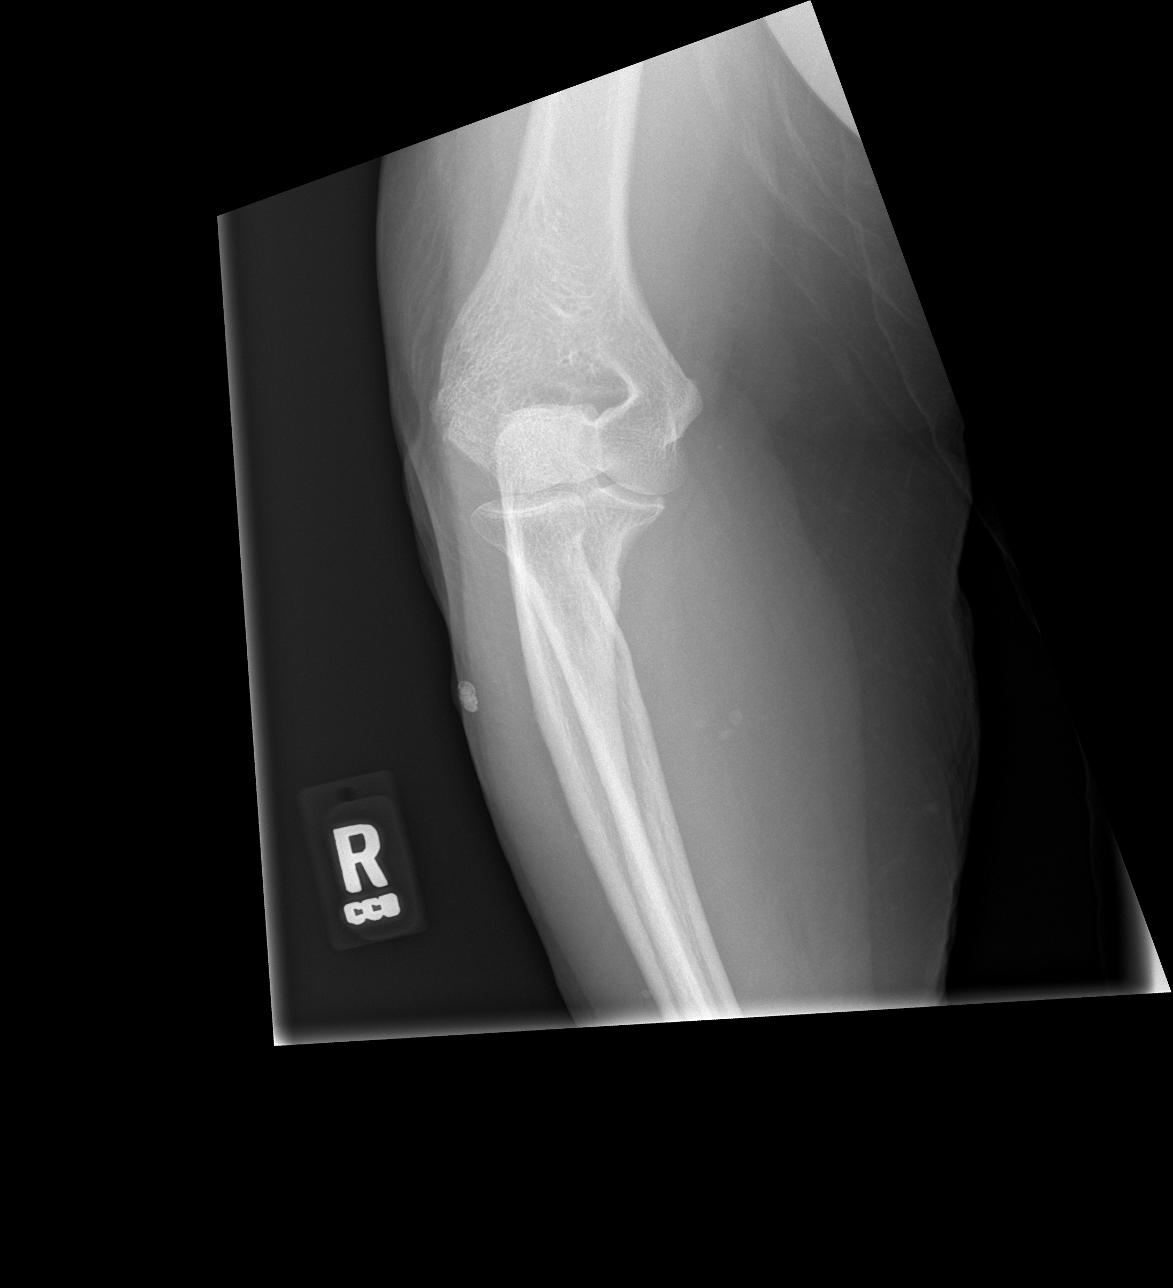

[x elbow obl right (4 of 4)]
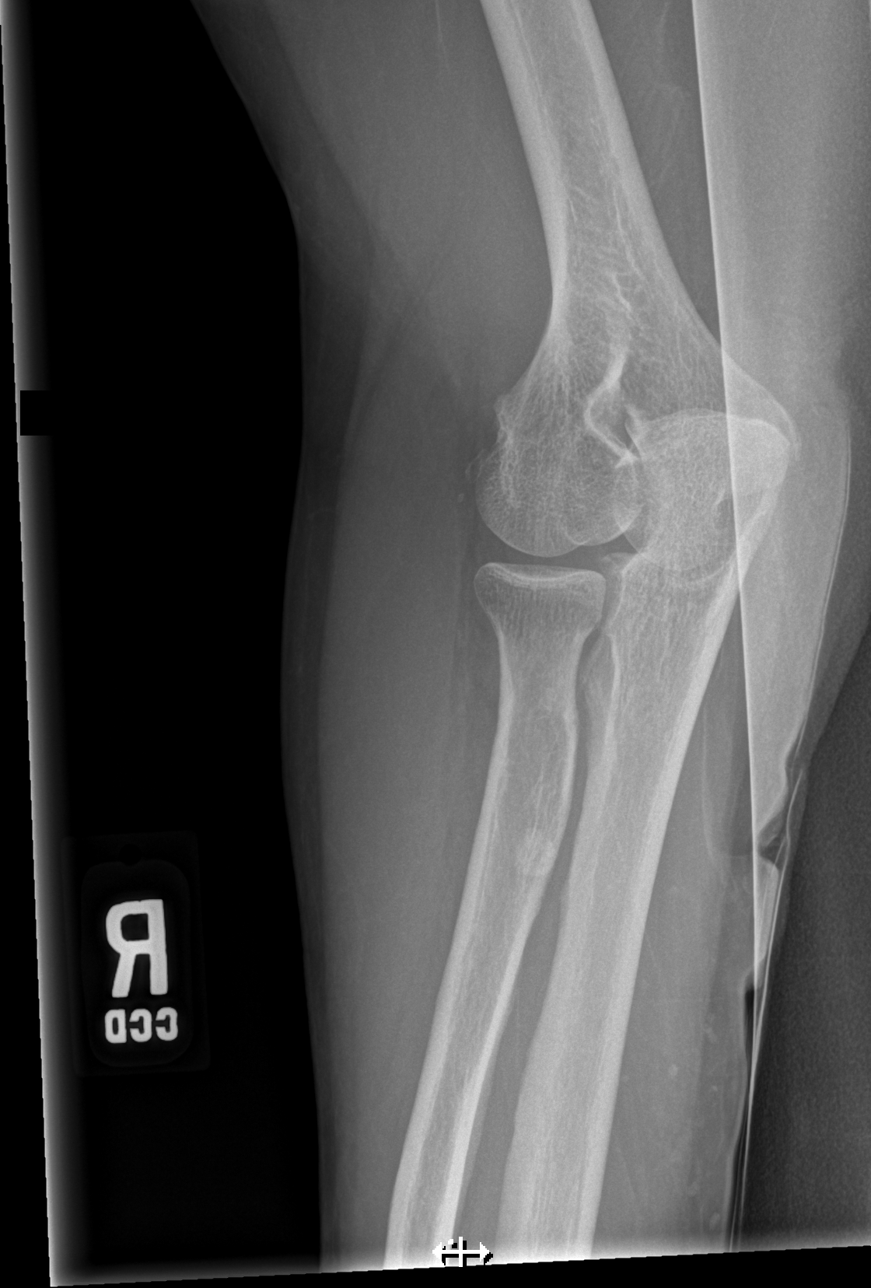

[4 of 4 positions shown; findings below may reference images not displayed]

FINDINGS: No acute fracture or traumatic malalignment. A small elbow joint
effusion is present. Corticated mineralization is within the
antecubital fossa could reflect benign soft tissue mineralization
versus intra-articular body. Additional scattered benign-appearing
soft tissue mineralization is present.
IMPRESSION: Small elbow joint effusion. No acute osseous abnormality.

## 2020-06-10 MED ORDER — OXYCODONE-ACETAMINOPHEN 10-325 MG PO TABS: 1.0000 | ORAL_TABLET | Freq: Four times a day (QID) | ORAL | 0 refills | Status: AC | PRN

## 2020-06-10 MED ORDER — OXYCODONE-ACETAMINOPHEN 10-325 MG PO TABS: 1.0000 | ORAL_TABLET | Freq: Four times a day (QID) | ORAL | 0 refills | Status: DC | PRN

## 2020-06-10 NOTE — Telephone Encounter (Signed)
error 

## 2020-06-10 NOTE — Progress Notes (Signed)
   Subjective:    Patient ID: Melanie Mcdonald, female    DOB: 04-Sep-1933, 86 y.o.   MRN: [id number]  HPI Here for pain management. Her arthritis pain has increased a bit. She is now home bound and is in a Palliative Care program.  Indication for chronic opioid: osteoarthritis Medication and dose: Percocet 10-325 # pills per month: 120 Last UDS date: 11-26-19 Opioid Treatment Agreement signed (Y/N): unable to obtain since she is homebound Opioid Treatment Agreement last reviewed with patient:  NA [place] reviewed this encounter (include red flags):  06-10-20 Virtual Visit via Telephone Note  I connected with the patient on 06/10/20 at 10:45 AM EDT by telephone and verified that I am speaking with the correct person using two identifiers.   I discussed the limitations, risks, security and privacy concerns of performing an evaluation and management service by telephone and the availability of in person appointments. I also discussed with the patient that there may be a patient responsible charge related to this service. The patient expressed understanding and agreed to proceed.  Location patient: home Location provider: work or home office Participants present for the call: patient, provider Patient did not have a visit in the prior 7 days to address this/these issue(s).   History of Present Illness:    Observations/Objective: Patient sounds cheerful and well on the phone. I do not appreciate any SOB. Speech and thought processing are grossly intact. Patient reported vitals:  Assessment and Plan:   Follow Up Instructions:     [id number] [id number] [id number] 06-77 0340 21-30 I did not refer this patient for an OV in the next 24 hours for this/these issue(s).  I discussed the assessment and treatment plan with the patient. The patient was provided an opportunity to ask questions and all were answered. The patient agreed with the plan and demonstrated an understanding of the instructions.     The patient was advised to call back or seek an in-person evaluation if the symptoms worsen or if the condition fails to improve as anticipated.  I provided 15 minutes of non-face-to-face time during this encounter.   Melanie Penna, MD    Review of Systems     Objective:   Physical Exam        Assessment & Plan:  Pain management. meds were refilled.  Melanie Penna, MD

## 2020-06-15 ENCOUNTER — Telehealth: Payer: Self-pay | Admitting: Family Medicine

## 2020-06-15 NOTE — Telephone Encounter (Signed)
Kristin from Hospice called to check on this message. She stated the pt is in a lot of pain and was hoping to get a response today. Informed her that PCP was seeing pts in office and has not had a moment to review the message but as soon as he does we will contact her at  [phone]

## 2020-06-15 NOTE — Telephone Encounter (Signed)
Erasmo Downer is calling in stating that the pt is taking PERCOCET every 6 hours for R side pain and they would like to see I there is a longer active medication for pain and take percocet for breakthrough pain.  Pt is doing well under their care they are just wanting to see if they can get something to help control her pain.  Erasmo Downer would like to have a call back to see what medication pt would be getting.

## 2020-06-16 MED ORDER — OXYCODONE HCL ER 30 MG PO T12A: 30.0000 mg | EXTENDED_RELEASE_TABLET | Freq: Two times a day (BID) | ORAL | 0 refills | Status: DC

## 2020-06-16 NOTE — Telephone Encounter (Signed)
Lvm for Kristen to call the office back.

## 2020-06-16 NOTE — Telephone Encounter (Signed)
I sent in Oxycontin 30 mg to take BID, and she can use the Percocet prn

## 2020-06-17 DIAGNOSIS — E1165 Type 2 diabetes mellitus with hyperglycemia: Secondary | ICD-10-CM | POA: Diagnosis not present

## 2020-06-18 ENCOUNTER — Telehealth: Payer: Self-pay | Admitting: Family Medicine

## 2020-06-18 ENCOUNTER — Telehealth: Payer: Self-pay | Admitting: *Deleted

## 2020-06-18 NOTE — Telephone Encounter (Signed)
Pt son [name]) came in and dropped off FMLA forms to be completed by the provider.  Upon completion pt would like for the forms to be faxed to [employer].  Forms placed in providers folder for completion.

## 2020-06-18 NOTE — Telephone Encounter (Signed)
LINQ alert received for tachy episode on 06/17/20 at 03:26, 2 min 42 sec duration, median V rate 171bpm.  Some R-R variability at times.  First tachy detection since implant in 07/2017, implanted for cryptogenic stroke.  On clopidogrel 75mg  daily.  ECG reviewed by Dr. Quentin Ore, who advised ECG appears to exhibit SVT, but recommended review by Dr. Lovena Le.  Routed for review.

## 2020-06-18 NOTE — Telephone Encounter (Signed)
Lvm for Kristen to call the office back.

## 2020-06-18 NOTE — Telephone Encounter (Signed)
Unclear of SVT but would not change treatment at this point. Watchful waiting.

## 2020-06-18 NOTE — Telephone Encounter (Signed)
Placed in red folder  

## 2020-06-24 ENCOUNTER — Telehealth: Payer: Self-pay | Admitting: Family Medicine

## 2020-06-24 NOTE — Telephone Encounter (Signed)
Please advise 

## 2020-06-24 NOTE — Telephone Encounter (Signed)
pt is declining  not to eat and drink author care  Renee [phone]     need a refill [facility])   [facility] [place], [place] AT [address]  Phone:  [phone] Fax:  [phone]

## 2020-06-28 NOTE — Telephone Encounter (Signed)
Her eating and drinking are noted. As for the Ambien, I already refilled this on 06-03-20

## 2020-06-30 NOTE — Telephone Encounter (Signed)
I need more information to fill this out. Is it for intermittent leave? If so, how often will he need it and for how long each time?

## 2020-06-30 NOTE — Telephone Encounter (Signed)
Patient's son called and he says he goes to work 1-2 days/week, depending on his mom's condition. He says she needs 24/7 care and at this point, it's not much longer, so he wants to be there with her when she passes away. I advised I will inform Dr. Sarajane Jews and so the form is completed appropriately.

## 2020-07-01 NOTE — Telephone Encounter (Signed)
Patient's son [name] called and notified forms were faxed and are available for pickup at the front, he says he will be by this afternoon.

## 2020-07-01 NOTE — Telephone Encounter (Signed)
The form is ready  

## 2020-07-06 ENCOUNTER — Ambulatory Visit (INDEPENDENT_AMBULATORY_CARE_PROVIDER_SITE_OTHER)

## 2020-07-06 DIAGNOSIS — I639 Cerebral infarction, unspecified: Secondary | ICD-10-CM | POA: Diagnosis not present

## 2020-07-06 LAB — CUP PACEART REMOTE DEVICE CHECK
Date Time Interrogation Session: 20211004232551
Implantable Pulse Generator Implant Date: 20181022

## 2020-07-09 NOTE — Progress Notes (Signed)
[facility] Summary Report / Loop Recorder 

## 2020-08-02 ENCOUNTER — Telehealth: Payer: Medicare Other | Admitting: Family Medicine

## 2020-08-08 ENCOUNTER — Other Ambulatory Visit: Payer: Self-pay | Admitting: Family Medicine

## 2020-08-08 LAB — CUP PACEART REMOTE DEVICE CHECK
Date Time Interrogation Session: 20211106232738
Implantable Pulse Generator Implant Date: 20181022

## 2020-08-25 ENCOUNTER — Telehealth: Payer: Self-pay

## 2020-08-25 NOTE — Telephone Encounter (Signed)
Form for medication orders faxed to [facility] @ [phone].  Dm/cma

## 2020-09-01 DEATH — deceased

## 2020-09-06 ENCOUNTER — Telehealth: Payer: Self-pay

## 2020-09-06 NOTE — Telephone Encounter (Signed)
Orders faxed to [facility] on 09/06/20 @1040am 

## 2020-09-12 LAB — CUP PACEART REMOTE DEVICE CHECK
Date Time Interrogation Session: 20211210010900
Implantable Pulse Generator Implant Date: 20181022

## 2020-09-13 ENCOUNTER — Ambulatory Visit (INDEPENDENT_AMBULATORY_CARE_PROVIDER_SITE_OTHER): Payer: Medicare PPO

## 2020-09-13 DIAGNOSIS — I639 Cerebral infarction, unspecified: Secondary | ICD-10-CM

## 2020-09-27 NOTE — Progress Notes (Signed)
[facility] Summary Report / Loop Recorder
